# Patient Record
Sex: Male | Born: 1975
Health system: Southern US, Community
[De-identification: ages and names within clinical notes are randomized; demographics above are authoritative.]

## PROBLEM LIST (undated history)

## (undated) ENCOUNTER — Emergency Department (HOSPITAL_COMMUNITY): Admission: EM | Payer: MEDICAID | Source: Home / Self Care

## (undated) DIAGNOSIS — F411 Generalized anxiety disorder: Secondary | ICD-10-CM

## (undated) DIAGNOSIS — E119 Type 2 diabetes mellitus without complications: Secondary | ICD-10-CM

## (undated) DIAGNOSIS — F419 Anxiety disorder, unspecified: Secondary | ICD-10-CM

## (undated) DIAGNOSIS — Z720 Tobacco use: Secondary | ICD-10-CM

## (undated) DIAGNOSIS — I5042 Chronic combined systolic (congestive) and diastolic (congestive) heart failure: Secondary | ICD-10-CM

## (undated) DIAGNOSIS — K219 Gastro-esophageal reflux disease without esophagitis: Secondary | ICD-10-CM

## (undated) DIAGNOSIS — E7800 Pure hypercholesterolemia, unspecified: Secondary | ICD-10-CM

## (undated) DIAGNOSIS — I219 Acute myocardial infarction, unspecified: Secondary | ICD-10-CM

## (undated) DIAGNOSIS — I471 Supraventricular tachycardia, unspecified: Secondary | ICD-10-CM

## (undated) DIAGNOSIS — F329 Major depressive disorder, single episode, unspecified: Secondary | ICD-10-CM

## (undated) DIAGNOSIS — I10 Essential (primary) hypertension: Secondary | ICD-10-CM

## (undated) DIAGNOSIS — Z9861 Coronary angioplasty status: Secondary | ICD-10-CM

## (undated) DIAGNOSIS — R7303 Prediabetes: Secondary | ICD-10-CM

## (undated) DIAGNOSIS — I2510 Atherosclerotic heart disease of native coronary artery without angina pectoris: Secondary | ICD-10-CM

## (undated) DIAGNOSIS — F32A Depression, unspecified: Secondary | ICD-10-CM

## (undated) DIAGNOSIS — I255 Ischemic cardiomyopathy: Secondary | ICD-10-CM

## (undated) HISTORY — DX: Generalized anxiety disorder: F41.1

## (undated) HISTORY — DX: Essential (primary) hypertension: I10

## (undated) HISTORY — PX: CORONARY ANGIOPLASTY WITH STENT PLACEMENT: SHX49

## (undated) HISTORY — DX: Coronary angioplasty status: Z98.61

## (undated) HISTORY — DX: Major depressive disorder, single episode, unspecified: F32.9

## (undated) HISTORY — DX: Atherosclerotic heart disease of native coronary artery without angina pectoris: I25.10

## (undated) HISTORY — DX: Depression, unspecified: F32.A

## (undated) HISTORY — DX: Supraventricular tachycardia: I47.1

## (undated) HISTORY — DX: Prediabetes: R73.03

## (undated) HISTORY — DX: Tobacco use: Z72.0

## (undated) HISTORY — DX: Gastro-esophageal reflux disease without esophagitis: K21.9

## (undated) HISTORY — DX: Morbid (severe) obesity due to excess calories: E66.01

## (undated) HISTORY — DX: Supraventricular tachycardia, unspecified: I47.10

---

## 2003-11-25 ENCOUNTER — Emergency Department (HOSPITAL_COMMUNITY): Admission: EM | Admit: 2003-11-25 | Discharge: 2003-11-25 | Payer: Self-pay | Admitting: Family Medicine

## 2004-05-04 ENCOUNTER — Emergency Department (HOSPITAL_COMMUNITY): Admission: EM | Admit: 2004-05-04 | Discharge: 2004-05-04 | Payer: Self-pay | Admitting: Emergency Medicine

## 2006-04-26 ENCOUNTER — Emergency Department (HOSPITAL_COMMUNITY): Admission: EM | Admit: 2006-04-26 | Discharge: 2006-04-26 | Payer: Self-pay | Admitting: Family Medicine

## 2007-09-16 ENCOUNTER — Emergency Department (HOSPITAL_COMMUNITY): Admission: EM | Admit: 2007-09-16 | Discharge: 2007-09-16 | Payer: Self-pay | Admitting: Emergency Medicine

## 2009-03-23 ENCOUNTER — Ambulatory Visit: Payer: Self-pay | Admitting: Internal Medicine

## 2009-03-23 ENCOUNTER — Inpatient Hospital Stay (HOSPITAL_COMMUNITY): Admission: EM | Admit: 2009-03-23 | Discharge: 2009-03-27 | Payer: Self-pay | Admitting: Emergency Medicine

## 2009-03-23 ENCOUNTER — Other Ambulatory Visit: Payer: Self-pay

## 2009-03-26 ENCOUNTER — Encounter: Payer: Self-pay | Admitting: Internal Medicine

## 2009-07-28 ENCOUNTER — Emergency Department (HOSPITAL_COMMUNITY): Admission: EM | Admit: 2009-07-28 | Discharge: 2009-07-28 | Payer: Self-pay | Admitting: Emergency Medicine

## 2010-03-15 ENCOUNTER — Ambulatory Visit: Payer: Self-pay | Admitting: Cardiology

## 2010-06-21 ENCOUNTER — Ambulatory Visit: Payer: Self-pay | Admitting: Cardiology

## 2010-07-20 ENCOUNTER — Ambulatory Visit: Payer: Self-pay | Admitting: Cardiology

## 2010-10-31 ENCOUNTER — Emergency Department (HOSPITAL_COMMUNITY): Payer: Self-pay

## 2010-10-31 ENCOUNTER — Inpatient Hospital Stay (HOSPITAL_COMMUNITY)
Admission: EM | Admit: 2010-10-31 | Discharge: 2010-11-03 | DRG: 247 | Disposition: A | Discharge status: Home / Self Care | Payer: Self-pay | Attending: Cardiology | Admitting: Cardiology

## 2010-10-31 DIAGNOSIS — R7309 Other abnormal glucose: Secondary | ICD-10-CM | POA: Diagnosis present

## 2010-10-31 DIAGNOSIS — R079 Chest pain, unspecified: Secondary | ICD-10-CM

## 2010-10-31 DIAGNOSIS — I214 Non-ST elevation (NSTEMI) myocardial infarction: Principal | ICD-10-CM | POA: Diagnosis present

## 2010-10-31 DIAGNOSIS — I252 Old myocardial infarction: Secondary | ICD-10-CM

## 2010-10-31 DIAGNOSIS — F17200 Nicotine dependence, unspecified, uncomplicated: Secondary | ICD-10-CM | POA: Diagnosis present

## 2010-10-31 DIAGNOSIS — I2510 Atherosclerotic heart disease of native coronary artery without angina pectoris: Secondary | ICD-10-CM | POA: Diagnosis present

## 2010-10-31 DIAGNOSIS — E785 Hyperlipidemia, unspecified: Secondary | ICD-10-CM | POA: Diagnosis present

## 2010-10-31 LAB — POCT I-STAT, CHEM 8
BUN: 7 mg/dL (ref 6–23)
Calcium, Ion: 1.12 mmol/L (ref 1.12–1.32)
Chloride: 100 mEq/L (ref 96–112)
Creatinine, Ser: 1.2 mg/dL (ref 0.4–1.5)
Glucose, Bld: 136 mg/dL — ABNORMAL HIGH (ref 70–99)
HCT: 47 % (ref 39.0–52.0)
Hemoglobin: 16 g/dL (ref 13.0–17.0)
Potassium: 3.7 mEq/L (ref 3.5–5.1)
Sodium: 140 mEq/L (ref 135–145)
TCO2: 30 mmol/L (ref 0–100)

## 2010-10-31 LAB — DIFFERENTIAL
Basophils Absolute: 0.1 10*3/uL (ref 0.0–0.1)
Basophils Relative: 0 % (ref 0–1)
Eosinophils Absolute: 0.2 10*3/uL (ref 0.0–0.7)
Eosinophils Relative: 1 % (ref 0–5)
Lymphocytes Relative: 23 % (ref 12–46)
Lymphs Abs: 3.2 10*3/uL (ref 0.7–4.0)
Monocytes Absolute: 1 10*3/uL (ref 0.1–1.0)
Monocytes Relative: 7 % (ref 3–12)
Neutro Abs: 9.6 10*3/uL — ABNORMAL HIGH (ref 1.7–7.7)
Neutrophils Relative %: 68 % (ref 43–77)

## 2010-10-31 LAB — CBC
HCT: 42.8 % (ref 39.0–52.0)
Hemoglobin: 15.1 g/dL (ref 13.0–17.0)
MCH: 29.9 pg (ref 26.0–34.0)
MCHC: 35.3 g/dL (ref 30.0–36.0)
MCV: 84.8 fL (ref 78.0–100.0)
Platelets: 241 10*3/uL (ref 150–400)
RBC: 5.05 MIL/uL (ref 4.22–5.81)
RDW: 12.8 % (ref 11.5–15.5)
WBC: 14.1 10*3/uL — ABNORMAL HIGH (ref 4.0–10.5)

## 2010-10-31 LAB — LIPID PANEL
HDL: 41 mg/dL (ref >39)
Total CHOL/HDL Ratio: 4.1 RATIO
Triglycerides: 129 mg/dL (ref <150)
VLDL: 26 mg/dL (ref 0–40)

## 2010-10-31 LAB — MRSA PCR SCREENING

## 2010-10-31 LAB — HEPARIN LEVEL (UNFRACTIONATED)

## 2010-10-31 LAB — CK TOTAL AND CKMB (NOT AT ARMC)
CK, MB: 47.7 ng/mL (ref 0.3–4.0)
CK, MB: 46.7 ng/mL (ref 0.3–4.0)
Relative Index: 7.4 — ABNORMAL HIGH (ref 0.0–2.5)
Relative Index: 7 — ABNORMAL HIGH (ref 0.0–2.5)
Total CK: 642 U/L — ABNORMAL HIGH (ref 7–232)
Total CK: 666 U/L — ABNORMAL HIGH (ref 7–232)

## 2010-10-31 LAB — POCT CARDIAC MARKERS
CKMB, poc: 37.2 ng/mL (ref 1.0–8.0)
Myoglobin, poc: 137 ng/mL (ref 12–200)
Troponin i, poc: 3.64 ng/mL (ref 0.00–0.09)

## 2010-10-31 LAB — TROPONIN I

## 2010-10-31 LAB — HEMOGLOBIN A1C: Mean Plasma Glucose: 126 mg/dL — ABNORMAL HIGH (ref <117)

## 2010-10-31 NOTE — H&P (Signed)
NAMELARRI, YEHLE NO.:  [id number]  MEDICAL RECORD NO.:  [id number]           PATIENT TYPE:  E  LOCATION:  [facility]                         FACILITY:  [facility]  PHYSICIAN:  Wendi Snipes, MD DATE OF BIRTH:  [birth year]  DATE OF ADMISSION:  10/31/2010 DATE OF DISCHARGE:                             HISTORY & PHYSICAL   CARDIOLOGIST:  Colleen Can. Deborah Chalk, M.D.  PRIMARY CARE DOCTOR:  Lady Gary, NP.  CHIEF COMPLAINT:  Chest pain.  HISTORY OF PRESENT ILLNESS:  This is a 35 year old African American male with a history of coronary disease status post PCI to his mid LAD who presents with a few days of stuttering chest pain.  He states that he noticed a heartburn sensation in his mid chest that began on Thursday and lasted briefly.  The pain returned a few times yesterday and he took some Tums without relief.  He was worried about his symptoms and they feel distinctly different than his previous myocardial infarction in August, though he felt he needed to be checked out and reports to the emergency department.  He otherwise denies any recent exertional angina, increased lower extremity edema, palpitations, syncope or presyncope.  PAST MEDICAL HISTORY: 1. Coronary disease status post myocardial infarction requiring bare     metal stenting to the mid LAD in August 2010.  He additionally had     nonobstructive moderate coronary disease in the circumflex artery     and right coronary artery. 2. Hyperlipidemia.  ALLERGIES:  No known drug allergies.  MEDICATIONS ON ADMISSION:  Zocor.  SOCIAL HISTORY:  He lives in [place].  He works at [employer].  He continues to smoke one pack per day.  FAMILY HISTORY:  His father had myocardial infarction in his 72s.  REVIEW OF SYSTEMS:  All 14 systems were reviewed and were negative except as mentioned detail in the HPI.  PHYSICAL EXAM:  VITAL SIGNS:  Blood pressure is 113/65, respiratory rate 16, pulse is 85, satting 99%  on room air. GENERAL:  He is a 35 year old African American male appearing stated age in no acute distress. HEENT:  Moist mucous membranes.  Pupils equal, round, reactive to light and accommodation.  Anicteric sclerae. NECK:  No jugular venous distention.  No thyromegaly. CARDIOVASCULAR:  Regular rate and rhythm.  No murmurs, rubs or gallops. LUNGS:  Clear to auscultation bilaterally. ABDOMEN:  Nontender, nondistended.  Positive bowel sounds.  No masses. EXTREMITIES:  No clubbing, cyanosis or edema. NEUROLOGIC:  Alert and oriented x3.  Cranial nerves II-XII grossly intact.  No focal neurologic deficit. PSYCH:  Mood and affect are appropriate. SKIN:  Warm, dry, intact.  No rashes.  RADIOLOGY:  Chest x-ray is currently pending.  EKG showed normal sinus rhythm with a rate of 91 beats per minute with no ST-T wave abnormalities suggestive of ongoing ischemia.  LABORATORY DATA:  White count is 14, hematocrit 42.8, potassium is 3.7, creatinine is 1.2.  Troponin on point care marker is 3.64.  ASSESSMENT/PLAN:  This is a 35 year old [place] American male with history of coronary disease status post bare metal stenting here with an non-ST elevation myocardial infarction.  1. Non-ST elevation myocardial infarction.  We will start dual     antiplatelet therapy now and heparin.  We will obtain serial EKGs     if the chest pain returns.  We will consider an early invasive     strategy for further management of his coronary disease. 2. Hyperlipidemia.  We will continue Zocor and check a fasting lipid     profile.     Wendi Snipes, MD     BHH/MEDQ  D:  10/31/2010  T:  10/31/2010  Job:  [id number]  Electronically Signed by Jim Desanctis MD on 10/31/2010 03:26:01 PM

## 2010-11-01 DIAGNOSIS — I2510 Atherosclerotic heart disease of native coronary artery without angina pectoris: Secondary | ICD-10-CM

## 2010-11-01 DIAGNOSIS — I369 Nonrheumatic tricuspid valve disorder, unspecified: Secondary | ICD-10-CM

## 2010-11-01 LAB — BASIC METABOLIC PANEL
BUN: 10 mg/dL (ref 6–23)
CO2: 26 mEq/L (ref 19–32)
Calcium: 8.5 mg/dL (ref 8.4–10.5)
Chloride: 101 mEq/L (ref 96–112)
Creatinine, Ser: 1.01 mg/dL (ref 0.4–1.5)
GFR calc non Af Amer: >60 mL/min (ref >60)
Glucose, Bld: 126 mg/dL — ABNORMAL HIGH (ref 70–99)
Potassium: 3.8 mEq/L (ref 3.5–5.1)
Sodium: 135 mEq/L (ref 135–145)

## 2010-11-01 LAB — CARDIAC PANEL(CRET KIN+CKTOT+MB+TROPI)
CK, MB: 13.4 ng/mL (ref 0.3–4.0)
CK, MB: 24.3 ng/mL (ref 0.3–4.0)
Relative Index: 3.6 — ABNORMAL HIGH (ref 0.0–2.5)
Relative Index: 4.6 — ABNORMAL HIGH (ref 0.0–2.5)
Total CK: 376 U/L — ABNORMAL HIGH (ref 7–232)
Total CK: 530 U/L — ABNORMAL HIGH (ref 7–232)

## 2010-11-01 LAB — PROTIME-INR
INR: 0.97 (ref 0.00–1.49)
Prothrombin Time: 13.1 seconds (ref 11.6–15.2)

## 2010-11-01 LAB — CBC
HCT: 38.6 % — ABNORMAL LOW (ref 39.0–52.0)
Hemoglobin: 13.5 g/dL (ref 13.0–17.0)
MCH: 29.9 pg (ref 26.0–34.0)
MCHC: 35 g/dL (ref 30.0–36.0)
MCV: 85.6 fL (ref 78.0–100.0)
Platelets: 205 10*3/uL (ref 150–400)
RBC: 4.51 MIL/uL (ref 4.22–5.81)
RDW: 12.8 % (ref 11.5–15.5)
WBC: 11.9 10*3/uL — ABNORMAL HIGH (ref 4.0–10.5)

## 2010-11-01 LAB — POCT ACTIVATED CLOTTING TIME: Activated Clotting Time: 788 seconds

## 2010-11-01 LAB — LIPID PANEL
HDL: 35 mg/dL — ABNORMAL LOW (ref >39)
Total CHOL/HDL Ratio: 5.3 RATIO
Triglycerides: 917 mg/dL — ABNORMAL HIGH (ref <150)
VLDL: UNDETERMINED mg/dL (ref 0–40)

## 2010-11-01 LAB — HEPARIN LEVEL (UNFRACTIONATED)

## 2010-11-02 DIAGNOSIS — I214 Non-ST elevation (NSTEMI) myocardial infarction: Secondary | ICD-10-CM

## 2010-11-02 LAB — BASIC METABOLIC PANEL
BUN: 10 mg/dL (ref 6–23)
CO2: 24 mEq/L (ref 19–32)
Calcium: 8.6 mg/dL (ref 8.4–10.5)
Chloride: 104 mEq/L (ref 96–112)
Creatinine, Ser: 1.16 mg/dL (ref 0.4–1.5)
GFR calc Af Amer: >60 mL/min (ref >60)
GFR calc non Af Amer: >60 mL/min (ref >60)
Glucose, Bld: 254 mg/dL — ABNORMAL HIGH (ref 70–99)
Potassium: 4.5 mEq/L (ref 3.5–5.1)
Sodium: 135 mEq/L (ref 135–145)

## 2010-11-02 LAB — CBC
HCT: 37.7 % — ABNORMAL LOW (ref 39.0–52.0)
Hemoglobin: 13.1 g/dL (ref 13.0–17.0)
MCH: 29.7 pg (ref 26.0–34.0)
MCHC: 34.7 g/dL (ref 30.0–36.0)
MCV: 85.5 fL (ref 78.0–100.0)
Platelets: 195 10*3/uL (ref 150–400)
RBC: 4.41 MIL/uL (ref 4.22–5.81)
RDW: 12.6 % (ref 11.5–15.5)
WBC: 17.4 10*3/uL — ABNORMAL HIGH (ref 4.0–10.5)

## 2010-11-02 LAB — GLUCOSE, CAPILLARY
Glucose-Capillary: 130 mg/dL — ABNORMAL HIGH (ref 70–99)
Glucose-Capillary: 178 mg/dL — ABNORMAL HIGH (ref 70–99)

## 2010-11-02 LAB — DIFFERENTIAL
Basophils Absolute: 0 10*3/uL (ref 0.0–0.1)
Basophils Relative: 0 % (ref 0–1)
Eosinophils Absolute: 0 10*3/uL (ref 0.0–0.7)
Eosinophils Relative: 0 % (ref 0–5)
Lymphocytes Relative: 8 % — ABNORMAL LOW (ref 12–46)
Lymphs Abs: 1.5 10*3/uL (ref 0.7–4.0)
Monocytes Absolute: 1.2 10*3/uL — ABNORMAL HIGH (ref 0.1–1.0)
Monocytes Relative: 7 % (ref 3–12)
Neutro Abs: 14.7 10*3/uL — ABNORMAL HIGH (ref 1.7–7.7)
Neutrophils Relative %: 85 % — ABNORMAL HIGH (ref 43–77)

## 2010-11-02 NOTE — Procedures (Signed)
NAMEJACAI, Peter Russo NO.:  [id number]  MEDICAL RECORD NO.:  [id number]           PATIENT TYPE:  I  LOCATION:  2916                         FACILITY:  [facility]  PHYSICIAN:  Verne Carrow, MDDATE OF BIRTH:  07-24-1976  DATE OF PROCEDURE:  11/01/2010 DATE OF DISCHARGE:                           CARDIAC CATHETERIZATION   PRIMARY CARDIOLOGIST:  Colleen Can. Deborah Chalk, MD  PROCEDURES PERFORMED: 1. Left heart catheterization 2. Selective coronary angiography. 3. PTCA with placement of two overlapping drug-eluting stents in the     proximal and mid right coronary artery. 4. Placement of an Angio-Seal femoral artery closure device in the     right femoral artery.  OPERATOR:  Verne Carrow, MD  INDICATIONS:  This is a 35 year old African American male with a history of coronary artery disease with placement of a bare-metal stent in the mid left anterior descending artery in the setting of a non-ST-elevation myocardial infarction in August 2010.  The patient was admitted to the hospital with positive cardiac enzymes on October 31, 2010.  Diagnostic catheterization was arranged for today.  The catheterization was delayed as the patient was chest pain free.  There were no EKG changes to suggest an occluded vessel.  DETAILS OF PROCEDURE:  The patient was brought to the main cardiac catheterization laboratory after signing informed consent for the procedure.  I chose to use the right femoral artery for access as I knew from the prior case that this patient had separate ostia for the left anterior descending artery and circumflex artery.  There was difficulty with catheter engagement during the prior catheterization by Dr. Deborah Chalk in 2010.  The right groin was prepped and draped in sterile fashion. Lidocaine 1% was used for local anesthesia.  A 5-French sheath was inserted into the right femoral artery without difficulty.  A JL-4 catheter was used to engage both  the circumflex and the left anterior descending arteries in selective fashion.  These arteries had separate ostia.  There was no left main artery.  A JR-4 catheter was used to selectively engage and inject the native right coronary artery.  A pigtail catheter was used to measure left ventricular pressures.  No left ventricular angiogram was performed.  At this point of the case we elected to proceed intervention of the totally occluded mid right coronary artery.  The patient was given a bolus of Angiomax and a drip was started.  The patient had been loaded with Plavix yesterday.  We upsized the sheath to a 6-French sheath over a wire.  A 6-French JR-4 guiding catheter was used to selectively engage the native right coronary artery.  When the ACT was greater than 200, I passed a Cougar intracoronary wire through the area of total occlusion and into the distal right coronary artery.  A 2.0 x 12-mm balloon was inflated 3 times in the mid vessel with flow reestablished after the balloon inflations.  A 2.25 x 32-mm Promus Element drug-eluting stent was carefully positioned in the mid vessel and was deployed.  A 2.5 x 24-mm Promus drug-eluting stent was placed in the proximal vessel in an overlapping fashion with the previous stent.  A 2.5 x 20-mm noncompliant balloon was carefully positioned in the distal portion of the stented segment and was inflated 3 times back through the proximal vessel.  A 2.75 x 20-mm noncompliant balloon was carefully positioned in the proximal portion of the proximal stent and was inflated.  The stenosis was taken from 100% to 0%.  There were no immediate complications. There was excellent flow into the distal vessel.  The patient had no chest pain during or after the procedure.  The patient was taken to the recovery room in stable condition.  HEMODYNAMIC FINDINGS:  Central aortic pressure 117/82.  Left ventricular pressure 120/16.  Left ventricular end-diastolic  pressure 25.  ANGIOGRAPHIC FINDINGS: 1. There was no left main artery.  The circumflex and the left     anterior descending artery arose from separate ostia. 2. The left anterior descending artery was a moderate-to-large sized     vessel that coursed to the apex and gave off a large septal     perforating branch and a moderate-sized diagonal branch.  The mid     LAD had a patent stented segment with no evidence of restenosis.     The proximal and distal vessel had mild 30% plaque but no flow-     limiting lesions. 3. The circumflex artery gave off a large caliber obtuse marginal     branch.  The AV groove circumflex branch was relatively small     beyond the takeoff of this obtuse marginal branch.  There was a 50-     60% proximal stenosis.  The obtuse marginal branch had a 30%     stenosis.  These lesions appeared unchanged from prior     catheterization 2 years ago and were not flow-limiting. 4. The right coronary artery is a moderate-sized vessel with diffuse     70% stenosis throughout the proximal portion of the vessel followed     by 100% occlusion in the midportion of the vessel. 5. No left ventricular angiogram was performed.  IMPRESSION: 1. Non-ST-elevation myocardial infarction secondary to occluded right     coronary artery. 2. Patent stent in the mid left anterior descending artery. 3. Moderate disease in the circumflex artery. 4. Successful percutaneous transluminal coronary angioplasty with     placement of 2 overlapping drug-eluting stents in the right     coronary artery in the proximal and midportion.  RECOMMENDATIONS:  We will continue medical treatment with aspirin and Plavix for at least 1 year.  We will also continue his beta-blocker and statin.  We will discontinue his intravenous heparin drip.  The patient will be watched closely in the step-down ICU tonight.     Verne Carrow, MD     CM/MEDQ  D:  11/01/2010  T:  11/02/2010  Job:   [id number]  Electronically Signed by Verne Carrow MD on 11/02/2010 10:09:28 AM

## 2010-11-03 LAB — GLUCOSE, CAPILLARY: Glucose-Capillary: 174 mg/dL — ABNORMAL HIGH (ref 70–99)

## 2010-11-05 ENCOUNTER — Telehealth: Payer: Self-pay | Admitting: Cardiovascular Disease

## 2010-11-05 DIAGNOSIS — I10 Essential (primary) hypertension: Secondary | ICD-10-CM

## 2010-11-05 MED ORDER — LISINOPRIL 5 MG PO TABS: 5.0000 mg | ORAL_TABLET | Freq: Every day | ORAL | Status: DC

## 2010-11-05 NOTE — Telephone Encounter (Signed)
Pt requesting a refill of lisinopril at [facility]

## 2010-11-16 LAB — URINALYSIS, ROUTINE W REFLEX MICROSCOPIC
Bilirubin Urine: NEGATIVE
Glucose, UA: NEGATIVE mg/dL
Ketones, ur: NEGATIVE mg/dL
Leukocytes, UA: NEGATIVE
Nitrite: NEGATIVE
Protein, ur: 100 mg/dL — AB
Specific Gravity, Urine: 1.014 (ref 1.005–1.030)
Urobilinogen, UA: 1 mg/dL (ref 0.0–1.0)
pH: 6 (ref 5.0–8.0)

## 2010-11-16 LAB — URINE MICROSCOPIC-ADD ON

## 2010-11-16 LAB — GC/CHLAMYDIA PROBE AMP, GENITAL
Chlamydia, DNA Probe: NEGATIVE
GC Probe Amp, Genital: NEGATIVE

## 2010-11-17 ENCOUNTER — Encounter: Payer: Self-pay | Admitting: Physician Assistant

## 2010-11-18 ENCOUNTER — Telehealth: Payer: Self-pay | Admitting: Cardiovascular Disease

## 2010-11-18 NOTE — Telephone Encounter (Signed)
This use to be Dr Ronnald Nian pt but during most recent hospitalization the pt switched to Dr Clifton James.  The pt was scheduled to see Tereso Newcomer PA on 11/17/10 but no showed for appointment.  I will forward this message to [facility] RN to contact the pt.

## 2010-11-21 LAB — CARDIAC PANEL(CRET KIN+CKTOT+MB+TROPI)
CK, MB: 1.9 ng/mL (ref 0.3–4.0)
CK, MB: 16 ng/mL — ABNORMAL HIGH (ref 0.3–4.0)
CK, MB: 18.5 ng/mL — ABNORMAL HIGH (ref 0.3–4.0)
CK, MB: 21.8 ng/mL — ABNORMAL HIGH (ref 0.3–4.0)
CK, MB: 6.5 ng/mL — ABNORMAL HIGH (ref 0.3–4.0)
Relative Index: 0.5 (ref 0.0–2.5)
Relative Index: 1.8 (ref 0.0–2.5)
Relative Index: 3.7 — ABNORMAL HIGH (ref 0.0–2.5)
Relative Index: 4.2 — ABNORMAL HIGH (ref 0.0–2.5)
Relative Index: 4.7 — ABNORMAL HIGH (ref 0.0–2.5)
Total CK: 356 U/L — ABNORMAL HIGH (ref 7–232)
Total CK: 361 U/L — ABNORMAL HIGH (ref 7–232)
Total CK: 438 U/L — ABNORMAL HIGH (ref 7–232)
Total CK: 439 U/L — ABNORMAL HIGH (ref 7–232)
Total CK: 466 U/L — ABNORMAL HIGH (ref 7–232)
Troponin I: 0.1 ng/mL — ABNORMAL HIGH (ref 0.00–0.06)
Troponin I: 0.51 ng/mL (ref 0.00–0.06)
Troponin I: 1.51 ng/mL (ref 0.00–0.06)
Troponin I: 3.7 ng/mL (ref 0.00–0.06)
Troponin I: 4.25 ng/mL (ref 0.00–0.06)

## 2010-11-21 LAB — COMPREHENSIVE METABOLIC PANEL
ALT: 45 U/L (ref 0–53)
AST: 31 U/L (ref 0–37)
Albumin: 3.7 g/dL (ref 3.5–5.2)
Alkaline Phosphatase: 75 U/L (ref 39–117)
BUN: 11 mg/dL (ref 6–23)
CO2: 26 mEq/L (ref 19–32)
Calcium: 8.7 mg/dL (ref 8.4–10.5)
Chloride: 104 mEq/L (ref 96–112)
Creatinine, Ser: 1.13 mg/dL (ref 0.4–1.5)
GFR calc Af Amer: >60 mL/min (ref >60)
GFR calc non Af Amer: >60 mL/min (ref >60)
Glucose, Bld: 120 mg/dL — ABNORMAL HIGH (ref 70–99)
Potassium: 3.6 mEq/L (ref 3.5–5.1)
Sodium: 138 mEq/L (ref 135–145)
Total Bilirubin: 0.7 mg/dL (ref 0.3–1.2)
Total Protein: 6.9 g/dL (ref 6.0–8.3)

## 2010-11-21 LAB — CBC
HCT: 38.6 % — ABNORMAL LOW (ref 39.0–52.0)
HCT: 42.5 % (ref 39.0–52.0)
Hemoglobin: 13.2 g/dL (ref 13.0–17.0)
Hemoglobin: 14.5 g/dL (ref 13.0–17.0)
MCHC: 34.2 g/dL (ref 30.0–36.0)
MCHC: 34.2 g/dL (ref 30.0–36.0)
MCV: 88.7 fL (ref 78.0–100.0)
MCV: 90.2 fL (ref 78.0–100.0)
Platelets: 188 10*3/uL (ref 150–400)
Platelets: 189 10*3/uL (ref 150–400)
RBC: 4.35 MIL/uL (ref 4.22–5.81)
RBC: 4.71 MIL/uL (ref 4.22–5.81)
RDW: 14.1 % (ref 11.5–15.5)
RDW: 14.4 % (ref 11.5–15.5)
WBC: 10.6 10*3/uL — ABNORMAL HIGH (ref 4.0–10.5)
WBC: 11.7 10*3/uL — ABNORMAL HIGH (ref 4.0–10.5)

## 2010-11-21 LAB — POCT I-STAT, CHEM 8
BUN: 13 mg/dL (ref 6–23)
Calcium, Ion: 1.21 mmol/L (ref 1.12–1.32)
Chloride: 103 mEq/L (ref 96–112)
Creatinine, Ser: 1.2 mg/dL (ref 0.4–1.5)
Glucose, Bld: 117 mg/dL — ABNORMAL HIGH (ref 70–99)
HCT: 45 % (ref 39.0–52.0)
Hemoglobin: 15.3 g/dL (ref 13.0–17.0)
Potassium: 3.8 mEq/L (ref 3.5–5.1)
Sodium: 141 mEq/L (ref 135–145)
TCO2: 25 mmol/L (ref 0–100)

## 2010-11-21 LAB — BASIC METABOLIC PANEL
BUN: 10 mg/dL (ref 6–23)
BUN: 5 mg/dL — ABNORMAL LOW (ref 6–23)
CO2: 26 mEq/L (ref 19–32)
CO2: 27 mEq/L (ref 19–32)
Calcium: 8.7 mg/dL (ref 8.4–10.5)
Calcium: 8.9 mg/dL (ref 8.4–10.5)
Chloride: 101 mEq/L (ref 96–112)
Chloride: 104 mEq/L (ref 96–112)
Creatinine, Ser: 0.97 mg/dL (ref 0.4–1.5)
Creatinine, Ser: 0.98 mg/dL (ref 0.4–1.5)
GFR calc Af Amer: >60 mL/min (ref >60)
GFR calc Af Amer: >60 mL/min (ref >60)
GFR calc non Af Amer: >60 mL/min (ref >60)
GFR calc non Af Amer: >60 mL/min (ref >60)
Glucose, Bld: 102 mg/dL — ABNORMAL HIGH (ref 70–99)
Glucose, Bld: 119 mg/dL — ABNORMAL HIGH (ref 70–99)
Potassium: 3.4 mEq/L — ABNORMAL LOW (ref 3.5–5.1)
Potassium: 3.7 mEq/L (ref 3.5–5.1)
Sodium: 135 mEq/L (ref 135–145)
Sodium: 137 mEq/L (ref 135–145)

## 2010-11-21 LAB — PROTIME-INR
INR: 1.1 (ref 0.00–1.49)
Prothrombin Time: 13.7 seconds (ref 11.6–15.2)

## 2010-11-21 LAB — LIPID PANEL
Cholesterol: 240 mg/dL — ABNORMAL HIGH (ref 0–200)
HDL: 32 mg/dL — ABNORMAL LOW (ref >39)
LDL Cholesterol: UNDETERMINED mg/dL (ref 0–99)
Total CHOL/HDL Ratio: 7.5 RATIO
Triglycerides: 412 mg/dL — ABNORMAL HIGH (ref <150)
VLDL: UNDETERMINED mg/dL (ref 0–40)

## 2010-11-21 LAB — APTT: aPTT: 92 seconds — ABNORMAL HIGH (ref 24–37)

## 2010-11-21 LAB — CK TOTAL AND CKMB (NOT AT ARMC)
CK, MB: 1.4 ng/mL (ref 0.3–4.0)
Relative Index: 0.4 (ref 0.0–2.5)
Total CK: 396 U/L — ABNORMAL HIGH (ref 7–232)

## 2010-11-21 LAB — PLATELET COUNT: Platelets: 182 10*3/uL (ref 150–400)

## 2010-11-21 LAB — TROPONIN I: Troponin I: 0.02 ng/mL (ref 0.00–0.06)

## 2010-11-22 ENCOUNTER — Telehealth: Payer: Self-pay | Admitting: Cardiovascular Disease

## 2010-11-22 NOTE — Telephone Encounter (Signed)
Rescheduled appointment to see Dr. Clifton James next Friday 04/20 @ 3pm. He wasn't aware of his appointment with Tereso Newcomer, PA.

## 2010-11-22 NOTE — Telephone Encounter (Signed)
Spoke w/pt he states he was on Simvastatin and was changed to Lipitor in the hospital, he dev. Rash and itching on his back and arms he stopped Lipitor on Fri and has been taking Benadryl he states it is much better, he states he did not know that he had an appt. On Wed.  Advised we will discuss w/Dr Clifton James tom and give pt a call back then

## 2010-11-23 NOTE — Telephone Encounter (Signed)
Patient aware to restart Zocor at previous dose.

## 2010-11-23 NOTE — Telephone Encounter (Signed)
He can be switched back to Zocor. Thanks, chris

## 2010-11-24 NOTE — Discharge Summary (Signed)
Peter Russo, Peter Russo             ACCOUNT NO.:  [id number]  MEDICAL RECORD NO.:  [id number]           PATIENT TYPE:  I  LOCATION:  2009                         FACILITY:  [facility]  PHYSICIAN:  Peter Russo, M.D.DATE OF BIRTH:  [birth year]  DATE OF ADMISSION:  10/31/2010 DATE OF DISCHARGE:  11/03/2010                              DISCHARGE SUMMARY   PRIMARY CARDIOLOGIST:  Previously Peter Can. Peter Chalk, MD; will be Peter Carrow, MD in the future.  PRIMARY CARE PROVIDER:  None.  DISCHARGE DIAGNOSIS:  Non-ST-segment elevation myocardial infarction.  SECONDARY DIAGNOSES: 1. Coronary artery disease status post prior bare-metal stent in the     left anterior descending coronary artery with drug-eluting stent     placement to the right coronary artery this admission. 2. Hyperlipidemia. 3. Hyperglycemia with hemoglobin A1c of 6.0. 4. Ongoing tobacco abuse.  ALLERGIES:  No known drug allergies.  PROCEDURES: 1. Left heart cardiac catheterization performed on November 02, 2010,     revealing patent stent in the LAD.  Circumflex had a 50% proximal     stenosis.  Obtuse marginal had 30% stenosis.  Right coronary artery     had a 70% proximal stenosis and then a total occlusion in the     midsection of the artery.  Left ventriculography was not performed.     The RCA was successfully stented with a 2.25 x 32 mm Promus Element     Plus drug-eluting stent as well as a 2.5 x 24 mm Promus Element     Plus drug-eluting stent. 2. A 2D echocardiogram on November 01, 2010.  EF 55-60%.  Mild LVH.     Mildly dilated right ventricle.  Moderately reduced right     ventricular systolic function.  Mildly dilated right atrium.     Mildly elevated pulmonary artery systolic pressure.  HISTORY OF PRESENT ILLNESS:  [name] with prior history of coronary artery disease status post LAD bare-metal stenting in August 2010.  At that time, the patient otherwise had nonobstructive disease. The  patient was in his usual state of health until several days prior to admission when he began to experience intermittent substernal chest discomfort and heartburn sensation.  He had worsening symptoms on the day prior to admission that were unrelieved with over-the-counter antacids.  Because of progressive symptoms, which he thought were different than prior MI symptoms, he presented to [facility] ED on October 31, 2010.  In the ED, he was noted to have elevated troponin of 3.64. ECG showed no acute ST-T changes.  He was admitted for further evaluation and management of non-ST-elevation MI.  HOSPITAL COURSE:  The patient eventually peaked his CK at 666, MB at 47.7, and troponin I at 7.97.  The patient was maintained on aspirin, Plavix, heparin, beta-blocker, ACE inhibitor, and statin therapy, and had no recurrence of chest pain.  He was counseled the importance of smoking cessation.  The patient was taken to the [facility] on November 01, 2010, and underwent diagnostic catheterization revealing a total occlusion of the mid RCA.  Previously placed LAD stent was patent while he continued to have  nonobstructive disease in the left circumflex.  Attention was turned to the right coronary artery which was successfully stented with placement of 2 Promus Element Plus drug-eluting stents as outlined above.  He tolerated this procedure well and postprocedure 2D echocardiogram showed normal LV function.  The patient has been ambulating and has been seen by cardiac rehab.  He has had no additional chest discomfort.  He has been noted to have mild hyperglycemia with fasting sugars in the 120s-130s.  Hemoglobin A1c was 6.0.  He was seen by the inpatient diabetes coordinator who recommended lifestyle modification at this time.  We recommended obtain a followup with primary care provider.  The patient will be discharged home today in good condition.  DISCHARGE LABS:  Hemoglobin 13.1, hematocrit  37.7, WBC 17.4, platelets 195.  INR 0.97.  Sodium 135, potassium 4.5, chloride 104, CO2 24, BUN 10, creatinine 1.16, glucose 126.  Calcium 8.6.  Hemoglobin A1c 6.0.  CK 376, MB 13.4, troponin I 7.27.  Total cholesterol 169, triglycerides 129, HDL 41, LDL 102.  MRSA screen was negative.  DISPOSITION:  The patient will be discharged home today in good condition.  FOLLOWUP PLANS AND APPOINTMENTS:  The patient will follow up with Peter Newcomer, PA at [facility] on November 17, 2010, at 11:30 a.m.  He will follow up with Dr. Clifton Russo in the future.  DISCHARGE MEDICATIONS: 1. Plavix 75 mg daily. 2. Lipitor 80 mg at bedtime. 3. Lisinopril 5 mg daily. 4. Metoprolol 25 mg 1/2 tablet b.i.d. 5. Nitroglycerin 0.4 mg p.r.n. chest pain. 6. Aspirin 81 mg daily.  OUTSTANDING LAB STUDIES:  Follow up basic metabolic panel when seen on November 17, 2010, given new ACE inhibitor therapy.  Follow up lipids and LFTs in 6-8 weeks given switch from simvastatin to atorvastatin.  DURATION OF DISCHARGE ENCOUNTER:  45 minutes including physician time.     [name], ANP   ______________________________ Peter Russo, M.D.    CB/MEDQ  D:  11/03/2010  T:  11/04/2010  Job:  [id number]  Electronically Signed by [name] ANP on 11/23/2010 04:06:00 PM Electronically Signed by Peter Russo M.D. on 11/24/2010 11:18:16 AM

## 2010-12-02 ENCOUNTER — Encounter: Payer: Self-pay | Admitting: Cardiovascular Disease

## 2010-12-03 ENCOUNTER — Encounter: Payer: Self-pay | Admitting: Cardiovascular Disease

## 2010-12-28 NOTE — H&P (Signed)
NAMEHARJAS, BIGGINS NO.:  [id number]   MEDICAL RECORD NO.:  [id number]          PATIENT TYPE:  INP   LOCATION:  2903                         FACILITY:  [facility]   PHYSICIAN:  Bevelyn Buckles. Bensimhon, MDDATE OF BIRTH:  Dec 25, 1975   DATE OF ADMISSION:  03/23/2009  DATE OF DISCHARGE:                              HISTORY & PHYSICAL   PRIMARY CARE PHYSICIAN:  None.   CARDIOLOGIST:  None.   REASON FOR ADMISSION:  Chest pain with a transient lateral ST-elevation  concerning for acute lateral myocardial infarction.   Mr. Baig is a 35 year old male with a history of tobacco use and  strong family history of coronary artery disease.  He denies any known  personal history of coronary artery disease.  He does not have a history  of hypertension, hyperlipidemia, or diabetes that he knows of.   He woke this morning at 2 a.m. from sleep with chest pain and dyspnea.  According to ward, he arrived at [facility] just after 3 in the  morning.  I do not have the results of the initial EKG.  However, while  in the ER, he had stuttering chest pain and at 4:25, he had some lateral  ST elevation in V4, V5, and V6.  A followup EKG was done at 4:52 a.m.  and this showed resolution of his ST elevation.  Dr. Deborah Chalk was called,  and the code STEMI was activated, and the patient was transferred to the  [facility] ER where I evaluated him.  He is currently pain-free.  EKG  did not show any further ST elevation.  However, given his earlier  symptoms and EKG, he is brought emergently to the cath lab.  He is  currently pain-free.   He denies any exertional chest pain previously.  He denies any drug use.  He does smoke a pack and half a day.  He has not had any heart failure.  No bleeding.  Remainder of review of systems are all systems negative  except for as per HPI and problem list.   PROBLEM LIST:  Strong family history of coronary artery disease.   MEDICATIONS:  None.   ALLERGIES:  None.   SOCIAL HISTORY:  He is unemployed.  He formerly worked as a Scientist, water quality.  He smokes a pack and half of cigarettes a day.  Denies any drug use.   FAMILY HISTORY:  Mother is alive, she is status post bypass surgery.  Father died in his 70s due to massive heart attack.   PHYSICAL EXAMINATION:  GENERAL:  He is lying in bed.  VITAL SIGNS:  His blood pressure is 151/97 and heart rate is 59.  He is  pain-free.  HEENT:  Normal.  NECK:  Supple.  No obvious JVD.  Carotids are 2+ bilaterally, was unable  to hear any bruits.  There is no lymphadenopathy or thyromegaly.  CARDIAC:  PMI is not palpable.  He is regular, question soft systolic  murmur.  LUNGS:  Clear.  ABDOMEN:  Obese, nontender, and nondistended.  No hepatosplenomegaly, no  bruits, and no masses.  Good bowel sounds.  EXTREMITIES:  Warm with no cyanosis, clubbing, or edema.  No rash.  NEUROLOGIC:  Alert and oriented x3.  Cranial nerves II through XII are  intact.  Moves all 4 extremities without difficulty.   First troponin is 0.02.  MB is 1.4.  There are no other labs.  EKG is as  dictated above.  His chest x-ray is normal.   ASSESSMENT:  1. Chest pain with transient lateral ST-elevation concerning for acute      lateral myocardial infarction.  2. Tobacco use.   He will be taken emergently to the cath lab for definitive evaluation.  We will check a urine drug screen.  He will need a smoking cessation  consult.  Further plans based on the results of his catheterization.      Bevelyn Buckles. Bensimhon, MD  Electronically Signed     DRB/MEDQ  D:  03/23/2009  T:  03/23/2009  Job:  [id number]

## 2010-12-28 NOTE — Cardiovascular Report (Signed)
NAMEBRODERICK, FONSECA Peter Russo   MEDICAL RECORD Peter Russo          PATIENT TYPE:  INP   LOCATION:  2903                         FACILITY:  [facility]   PHYSICIAN:  Colleen Can. Deborah Chalk, M.D.DATE OF BIRTH:  05-Feb-1976   DATE OF PROCEDURE:  03/23/2009  DATE OF DISCHARGE:                            CARDIAC CATHETERIZATION   PROCEDURES:  Left heart catheterization with selective coronary  angiography, left ventricular angiography, with a stent in the mid left  anterior descending coronary artery (non-drug-eluting).   TYPE AND SITE OF ENTRY:  Percutaneous right femoral artery.   CATHETERS:  6-French 4 curved Judkins left and right coronary catheters,  6-French pigtail ventriculographic catheter with the guide catheter  being a 6-French 3.5 left coronary catheter, Asahi soft guidewires, 3.0  x 15 mm apex balloon, and the stent being a 3.0 x 23 mm Multi-Link  Vision stent.   MEDICATIONS GIVEN PRIOR TO PROCEDURE:  Heparin.   MEDICATIONS GIVEN DURING THE PROCEDURE:  IV nitroglycerin, Plavix,  Zofran, Versed, fentanyl, Integrilin, heparin to therapeutic level, and  Plavix p.o. (given twice because the first dose was loss with emesis).   CONTRAST MATERIAL:  Omnipaque.   COMMENTS:  The patient had a great deal of chest pain both before and  after the stent placement.  He had ongoing chest pain in spite of a  patent artery that was viewed in multiple images.  He was hypertensive.  The symptoms gradually resolved with sedation and management of his  blood pressure.  He did receive 20 mg of labetalol IV for blood pressure  management.   HEMODYNAMIC DATA:  The aortic pressure was 118/75 initially.  It is  subsequently elevated.  At the time of the left ventricular angiogram,  aortic pressure was 158/103 and LV pressure was 145/7-26.  There was no  aortic valve gradient noted on pullback.   ANGIOGRAPHIC DATA:  1. There was a dual ostium of the left coronary  artery.  There was no      left main coronary artery.  2. Left circumflex:  Left circumflex continued as a large obtuse      marginal with scattered irregularities throughout its course.  At      the point where the obtuse marginal began and the continuation of      the left circumflex proceeded, there appeared to be a 50-60%      narrowing.  There were diffuse irregularities in the left      circumflex continuation branch.  3. Left anterior descending:  Left anterior descending has a 95%      stenosis just proximal to the second diagonal vessel.  It was      difficult to visualize because catheters tend to select the left      circumflex.  4. Right coronary artery:  The right coronary artery was a dominant      vessel.  It had diffuse irregularities with what would appear to be      30-40% narrowings, but no significant focal obstructive disease.      There was adequate distal flow, although distal vessels were  small      to moderate.  There were at least 5 different branches to the      inferior wall.   Left ventricular angiogram was performed in the RAO projection.  The  overall cardiac size was normal.  There was anteroapical hypokinesia.  The global ejection fraction was estimated to be in the 35-40% range.   ANGIOPLASTY PROCEDURE:  The angioplasty was a difficult procedure in  large part because of the difficulty in getting catheters to select the  left anterior descending.  The 95% stenosis in the mid left anterior  descending was approximately 1 cm proximal to a large second diagonal  vessel.  The guidewire selected the second diagonal vessel, and we were  unable to re-stir it into the left anterior descending.  We elected to  proceed on with angioplasty with the guidewire in the second diagonal  vessel.  This turned out to be a satisfactory choice.  We initially  predilated with a 3.0 x 15 mm apex balloon.  We then returned with a 3.0  x 23 mm Multi-Link Vision stent.  It was  inflated to a maximum of 18  atmospheres.  Final angiographic result was satisfactory with no  residual stenosis.  There was excellent flow in the bifurcation of the 2  branches.  The patient continued to have chest pain after the  angioplasty.  It was felt that the vessel was patent, and there were no  occluded side branches from the angioplasty.  We treated him at that  time with narcotics and IV nitroglycerin as well as beta-blockers, and  had resolution of chest pain at the time of leaving the catheterization  lab.  Vessels were patent, and EKG was normal.   OVERALL IMPRESSION:  1. Severe stenosis in the mid left anterior descending coronary with      mild-to-moderate coronary atherosclerosis in the right coronary      artery and left circumflex systems.  2. Anteroapical hypokinesia with moderate left ventricular      dysfunction.  3. Successful stent with a non-drug-eluting stent (in the mid left      anterior descending).      Colleen Can. Deborah Chalk, M.D.  Electronically Signed     SNT/MEDQ  D:  03/23/2009  T:  03/23/2009  Job:  [id number]

## 2010-12-31 NOTE — Discharge Summary (Signed)
Peter, Russo NO.:  [id number]   MEDICAL RECORD NO.:  [id number]          PATIENT TYPE:  INP   LOCATION:  2038                         FACILITY:  [facility]   PHYSICIAN:  Colleen Can. Deborah Chalk, M.D.DATE OF BIRTH:  12/09/1975   DATE OF ADMISSION:  03/23/2009  DATE OF DISCHARGE:  03/27/2009                               DISCHARGE SUMMARY   DISCHARGE DIAGNOSES:  1. Anterior myocardial infarction with subsequent emergent cardiac      catheterization and stenting in the mid left anterior descending.  2. Ongoing tobacco abuse.  3. Strong family history of coronary disease.   HISTORY OF PRESENT ILLNESS:  Mr. Caspers is a 35 year old black male  who has a history of tobacco use and strong family history of coronary  disease.  He denied any known personal history of coronary artery  disease.  He presented with chest discomfort after being awakened from  his sleep at 2:00 a.m. in the morning and subsequent shortness of  breath.  He was taken to [facility] just after 3 o'clock in the morning  and while there, he continued to have stuttering like chest pain.  He  had ST elevation at 4:25 a.m. in leads V4, V5 and V6.  Subsequent code  STEMI was activated and the patient was taken to the [facility]  emergency room and subsequently on to the cardiac catheterization lab.   Please see the history and physical for further patient presentation and  profile.   LABORATORY DATA:  On admission CBC showed hemoglobin 13, hematocrit 38,  white count was 11, platelets were 188.  Peak troponin was 4.2, peak CK-  MB was 18.  Chemistry showed a sodium 135, potassium 3.7, chloride 101,  CO2 of 26, BUN 5, creatinine 0.9 and a glucose of 119.  A chest x-ray on  admission showed no acute cardiopulmonary process.   HOSPITAL COURSE:  The patient was admitted after being transferred from  the [facility] emergency room.  He was taken emergently to the cardiac  catheterization lab per Dr.  Roger Shelter, procedure was tolerated  well without any known complications.  He was noted to have a dual  ostium of the left coronary artery.  There was no left main coronary  artery.  The left circumflex continued as a large obtuse marginal with  scattered irregularities throughout.  At the point where the obtuse  marginal began and the continuation of the left circumflex proceeded,  there appeared to be a 50-60% narrowing.  The LAD had a 95% stenosis  just proximally to the second diagonal.  It was difficult to visualize  because the catheters tended to select the left circumflex artery.  Right coronary artery was a dominant vessel and had diffuse  irregularities with what would appear to be 30-40% in nature.  There was  no significant focal disease.  Ejection fraction was noted to be 35-40%.  The patient subsequently underwent stenting with a non drug-eluting  Multi-Link 3.0 x 15-mm stent to the mid LAD.  The procedure was  successful.  Postprocedure, he was transferred to the coronary care  unit.  He was maintained on IV nitroglycerin which was subsequently able  to be weaned.  He basically did well throughout the remainder of his  hospitalization.  Cardiac rehab was started.  His activity was increased  and tolerated without any difficulty.  A follow up 2-D echocardiogram  showed a normal ejection fraction.  By March 27, 2009, he was doing  well and was felt to be a satisfactory candidate for discharge.   DISCHARGE CONDITION:  Stable.   DISCHARGE DIET:  Low-salt, heart-healthy.   ACTIVITY:  To increase slowly.  He was not to drive as well as not to  engage in sexual activity.  He is to use an ice pack if needed to the  groin.   DISCHARGE MEDICINES:  1. Aspirin 325 a day.  2. Plavix 75 mg a day.  3. Toprol-XL 50 mg a day.  4. Crestor 20 mg a day.  5. Nitroglycerin p.r.n.   Samples will try to be made available from the office.   I plan on seeing him back in the office  in approximately 1-2 weeks,  certainly sooner if any problems arise in the interim.      Sharlee Blew, N.P.      Colleen Can. Deborah Chalk, M.D.  Electronically Signed    LC/MEDQ  D:  04/09/2009  T:  04/09/2009  Job:  [id number]

## 2011-01-17 ENCOUNTER — Other Ambulatory Visit: Payer: Self-pay | Admitting: Nurse Practitioner

## 2011-01-21 ENCOUNTER — Other Ambulatory Visit: Payer: Self-pay

## 2011-01-21 DIAGNOSIS — I10 Essential (primary) hypertension: Secondary | ICD-10-CM

## 2011-01-21 MED ORDER — LISINOPRIL 5 MG PO TABS: 5.0000 mg | ORAL_TABLET | Freq: Every day | ORAL | Status: DC

## 2011-02-28 ENCOUNTER — Ambulatory Visit: Payer: Self-pay | Admitting: Cardiovascular Disease

## 2011-03-14 ENCOUNTER — Telehealth: Payer: Self-pay | Admitting: Cardiovascular Disease

## 2011-03-14 ENCOUNTER — Encounter: Payer: Self-pay | Admitting: Cardiovascular Disease

## 2011-03-14 ENCOUNTER — Ambulatory Visit (INDEPENDENT_AMBULATORY_CARE_PROVIDER_SITE_OTHER): Payer: BC Managed Care – PPO | Admitting: Cardiovascular Disease

## 2011-03-14 DIAGNOSIS — E785 Hyperlipidemia, unspecified: Secondary | ICD-10-CM | POA: Insufficient documentation

## 2011-03-14 DIAGNOSIS — I10 Essential (primary) hypertension: Secondary | ICD-10-CM

## 2011-03-14 DIAGNOSIS — E782 Mixed hyperlipidemia: Secondary | ICD-10-CM

## 2011-03-14 DIAGNOSIS — I2510 Atherosclerotic heart disease of native coronary artery without angina pectoris: Secondary | ICD-10-CM

## 2011-03-14 DIAGNOSIS — F17200 Nicotine dependence, unspecified, uncomplicated: Secondary | ICD-10-CM

## 2011-03-14 DIAGNOSIS — IMO0001 Reserved for inherently not codable concepts without codable children: Secondary | ICD-10-CM

## 2011-03-14 MED ORDER — BUPROPION HCL ER (XL) 150 MG PO TB24: 150.0000 mg | ORAL_TABLET | Freq: Every day | ORAL | Status: DC

## 2011-03-14 NOTE — Assessment & Plan Note (Signed)
Welbutrin and 14 mg patch.  Will call for further help if needed.  Discussed strategies for quitting and will refer to [facility] if initial attempts fail

## 2011-03-14 NOTE — Assessment & Plan Note (Signed)
Stable with no angina and good activity level.  Continue medical Rx  

## 2011-03-14 NOTE — Patient Instructions (Signed)
Your physician recommends that you schedule a follow-up appointment in: 6 MONTHS WITH DR [facility]  Your physician has recommended you make the following change in your medication: START WELLBUTRIN 150 MG 1 EVERY DAY   ALSO START  NICOTINE PATCHES OVER THE COUNTER USE AS DIRECTED

## 2011-03-14 NOTE — Assessment & Plan Note (Signed)
Well controlled.  Continue current medications and low sodium Dash type diet.    

## 2011-03-14 NOTE — Progress Notes (Signed)
35 yo patient of Dr Alice Reichert.  Supposed to F/U with him in April but never called and showed up today in error.  Has history of CAD with stent to LAD in 2010 and recent stent to RCA for SEMI  11/02/10 placed by CM.  EF normal.  Unfortunatly still smoking 1/2 pack /day.  Discussed at length for over 10 minutes importance of quitting.  It is [birth year] birthday today and told him it would be a great quit day.  Will call in Welbutrin and start 14mg  patches.  Patient seems to understand the importance of this.  No SSCP.  Compliant with meds.    ROS: Denies fever, malais, weight loss, blurry vision, decreased visual acuity, cough, sputum, SOB, hemoptysis, pleuritic pain, palpitaitons, heartburn, abdominal pain, melena, lower extremity edema, claudication, or rash.  All other systems reviewed and negative  General: Affect appropriate Healthy:  appears stated age HEENT: normal Neck supple with no adenopathy JVP normal no bruits no thyromegaly Lungs clear with no wheezing and good diaphragmatic motion Heart:  S1/S2 no murmur,rub, gallop or click PMI normal Abdomen: benighn, BS positve, no tenderness, no AAA no bruit.  No HSM or HJR Distal pulses intact with no bruits No edema Neuro non-focal Skin warm and dry No muscular weakness   Current Outpatient Prescriptions  Medication Sig Dispense Refill  . esomeprazole (NEXIUM) 20 MG capsule Take 20 mg by mouth daily before breakfast.        . lisinopril (PRINIVIL,ZESTRIL) 5 MG tablet Take 1 tablet (5 mg total) by mouth daily.  30 tablet  0  . metoprolol (LOPRESSOR) 50 MG tablet TAKE  1/2 TABLET BY MOUTH TWICE DAILY  30 tablet  6  . propranolol (INDERAL) 10 MG tablet Take 10 mg by mouth daily as needed.        . simvastatin (ZOCOR) 80 MG tablet Take 1 tablet by mouth Daily.      . Multiple Vitamin (MULTIVITAMIN) tablet Take 1 tablet by mouth daily.          Allergies  Review of patient's allergies indicates no known allergies.  Electrocardiogram:  NSR  67 Q3,f no acute changes  Assessment and Plan

## 2011-03-14 NOTE — Telephone Encounter (Signed)
Spoke with pt, questions answered Peter Russo  

## 2011-03-14 NOTE — Telephone Encounter (Signed)
pls clarify plavix dosage & direction , refill.

## 2011-03-14 NOTE — Assessment & Plan Note (Signed)
Recently started back on statin.  Discussed low fat diet.  F/U labs in 6 months Lab Results  Component Value Date   LDLCALC  Value: UNABLE TO CALCULATE IF TRIGLYCERIDE OVER 400 mg/dL        Total Cholesterol/HDL:CHD Risk Coronary Heart Disease Risk Table                     Men   Women  1/2 Average Risk   3.4   3.3  Average Risk       5.0   4.4  2 X Average Risk   9.6   7.1  3 X Average Risk  23.4   11.0        Use the calculated Patient Ratio above and the CHD Risk Table to determine the patient's CHD Risk.        ATP III CLASSIFICATION (LDL):  <100     mg/dL   Optimal  161-096  mg/dL   Near or Above                    Optimal  130-159  mg/dL   Borderline  045-409  mg/dL   High  >811     mg/dL   Very High 04/28/7828

## 2011-04-04 ENCOUNTER — Other Ambulatory Visit: Payer: Self-pay

## 2011-04-04 DIAGNOSIS — I10 Essential (primary) hypertension: Secondary | ICD-10-CM

## 2011-04-04 MED ORDER — LISINOPRIL 5 MG PO TABS: 5.0000 mg | ORAL_TABLET | Freq: Every day | ORAL | Status: DC

## 2011-04-22 ENCOUNTER — Other Ambulatory Visit: Payer: Self-pay | Admitting: Cardiovascular Disease

## 2011-04-22 MED ORDER — METOPROLOL TARTRATE 50 MG PO TABS: 25.0000 mg | ORAL_TABLET | Freq: Two times a day (BID) | ORAL | Status: DC

## 2011-04-29 ENCOUNTER — Telehealth: Payer: Self-pay | Admitting: Cardiovascular Disease

## 2011-04-29 NOTE — Telephone Encounter (Signed)
This was done on the 7th

## 2011-04-29 NOTE — Telephone Encounter (Signed)
Metoprolol 50 mg refill, uses [facility] [address] [phone]

## 2011-07-18 ENCOUNTER — Other Ambulatory Visit: Payer: Self-pay | Admitting: Cardiovascular Disease

## 2011-07-19 ENCOUNTER — Other Ambulatory Visit: Payer: Self-pay

## 2011-07-19 MED ORDER — SIMVASTATIN 80 MG PO TABS: 80.0000 mg | ORAL_TABLET | Freq: Every day | ORAL | Status: DC

## 2011-10-30 ENCOUNTER — Other Ambulatory Visit: Payer: Self-pay | Admitting: Cardiovascular Disease

## 2012-02-05 ENCOUNTER — Emergency Department (HOSPITAL_COMMUNITY)
Admission: EM | Admit: 2012-02-05 | Discharge: 2012-02-05 | Disposition: A | Discharge status: Home / Self Care | Payer: BC Managed Care – PPO | Attending: Emergency Medicine | Admitting: Emergency Medicine

## 2012-02-05 ENCOUNTER — Emergency Department (HOSPITAL_COMMUNITY): Payer: BC Managed Care – PPO

## 2012-02-05 ENCOUNTER — Encounter (HOSPITAL_COMMUNITY): Payer: Self-pay | Admitting: Emergency Medicine

## 2012-02-05 DIAGNOSIS — J40 Bronchitis, not specified as acute or chronic: Secondary | ICD-10-CM | POA: Insufficient documentation

## 2012-02-05 DIAGNOSIS — R05 Cough: Secondary | ICD-10-CM | POA: Insufficient documentation

## 2012-02-05 DIAGNOSIS — R059 Cough, unspecified: Secondary | ICD-10-CM | POA: Insufficient documentation

## 2012-02-05 MED ORDER — AZITHROMYCIN 250 MG PO TABS: ORAL_TABLET | ORAL | Status: AC

## 2012-02-05 MED ORDER — HYDROCOD POLST-CHLORPHEN POLST 10-8 MG/5ML PO LQCR: 5.0000 mL | Freq: Two times a day (BID) | ORAL | Status: DC

## 2012-02-05 MED ORDER — ALBUTEROL SULFATE HFA 108 (90 BASE) MCG/ACT IN AERS
2.0000 | INHALATION_SPRAY | RESPIRATORY_TRACT | Status: DC | PRN
  Administered 2012-02-05: 2 via RESPIRATORY_TRACT
  Filled 2012-02-05: qty 6.7

## 2012-02-05 MED ORDER — HYDROCOD POLST-CHLORPHEN POLST 10-8 MG/5ML PO LQCR
5.0000 mL | Freq: Once | ORAL | Status: AC
  Administered 2012-02-05: 5 mL via ORAL
  Filled 2012-02-05: qty 5

## 2012-02-05 MED ORDER — CETIRIZINE-PSEUDOEPHEDRINE ER 5-120 MG PO TB12: 1.0000 | ORAL_TABLET | Freq: Every day | ORAL | Status: AC

## 2012-02-05 MED ORDER — ALBUTEROL SULFATE (5 MG/ML) 0.5% IN NEBU
5.0000 mg | INHALATION_SOLUTION | Freq: Once | RESPIRATORY_TRACT | Status: AC
  Administered 2012-02-05: 5 mg via RESPIRATORY_TRACT
  Filled 2012-02-05: qty 1

## 2012-02-05 MED ORDER — GUAIFENESIN ER 600 MG PO TB12: 600.0000 mg | ORAL_TABLET | Freq: Two times a day (BID) | ORAL | Status: AC

## 2012-02-05 NOTE — ED Provider Notes (Signed)
History     CSN: [id number]  Arrival date & time 02/05/12  0045   First MD Initiated Contact with Patient 02/05/12 0124      Chief Complaint  Patient presents with  . Cough    HPI  History provided by the patient. Patient is 36 year old male with no significant past medical history who presents with complaints of persistent coughing for the past 3 days. Cough has been occasionally productive of white phlegm. Cough seems worse in the evening and night times making it difficult to sleep. Patient has used some over-the-counter medications without improvement. he is a current smoker. Symptoms have been associated with slight headache, rhinorrhea and congestion with left basilar sinus pressure. Symptoms are not associated with any fever, chills, sweats, nausea or vomiting.    Past Medical History  Diagnosis Date  . SVT (supraventricular tachycardia) APRROX 10 YRS AGO    PRESUMED AVNRT, ECHO 12/07 WITH EF 65%, MILD LAE  . GERD (gastroesophageal reflux disease)     Past Surgical History  Procedure Date  . Coronary stent placement     Family History  Problem Relation Age of Onset  . Arrhythmia Mother     MOTHER LIVED TO BE 102  . Coronary artery disease Father 40  . Heart disease Father     History  Substance Use Topics  . Smoking status: Current Everyday Smoker  . Smokeless tobacco: Not on file  . Alcohol Use: Yes      Review of Systems  Constitutional: Positive for fatigue. Negative for fever, chills and appetite change.  Respiratory: Positive for cough. Negative for shortness of breath and wheezing.   Cardiovascular: Negative for chest pain.  Gastrointestinal: Positive for abdominal pain. Negative for nausea, vomiting, diarrhea and constipation.  Skin: Negative for rash.    Allergies  Review of patient's allergies indicates no known allergies.  Home Medications   Current Outpatient Rx  Name Route Sig Dispense Refill  . ASPIRIN EC 81 MG PO TBEC Oral Take 81 mg  by mouth daily.    . BUPROPION HCL ER (XL) 150 MG PO TB24 Oral Take 150 mg by mouth daily.    Marland Kitchen ESOMEPRAZOLE MAGNESIUM 20 MG PO CPDR Oral Take 20 mg by mouth daily before breakfast.      . LISINOPRIL 5 MG PO TABS Oral Take 5 mg by mouth daily.    Marland Kitchen METOPROLOL TARTRATE 50 MG PO TABS Oral Take 25 mg by mouth 2 (two) times daily.    Marland Kitchen ONE-DAILY MULTI VITAMINS PO TABS Oral Take 1 tablet by mouth daily.      Marland Kitchen SIMVASTATIN 80 MG PO TABS Oral Take 80 mg by mouth at bedtime.      BP 142/84  Pulse 108  Temp 99.3 F (37.4 C) (Oral)  Resp 22  SpO2 96%  Physical Exam  Nursing note and vitals reviewed. Constitutional: He is oriented to person, place, and time. He appears well-developed and well-nourished. No distress.  HENT:  Head: Normocephalic.  Right Ear: External ear normal.  Left Ear: External ear normal.  Mouth/Throat: Oropharynx is clear and moist.       Discharge from left nostril. Mild left maxillary sinus tenderness  Cardiovascular: Normal rate and regular rhythm.   No murmur heard. Pulmonary/Chest: Effort normal. No respiratory distress. He has wheezes. He has no rales. He exhibits no tenderness.       Coughing  Abdominal: Soft.       Mild diffuse tenderness  Neurological: He is alert  and oriented to person, place, and time.  Skin: Skin is warm.  Psychiatric: He has a normal mood and affect. His behavior is normal.    ED Course  Procedures    Dg Chest 2 View  02/05/2012  *RADIOLOGY REPORT*  Clinical Data: Cough.  CHEST - 2 VIEW  Comparison: 10/31/2010  Findings: Mild peribronchial thickening. Heart and mediastinal contours are within normal limits.  No focal opacities or effusions.  No acute bony abnormality.  IMPRESSION: Mild bronchitic changes.  Original Report Authenticated By: Cyndie Chime, M.D.     1. Bronchitis       MDM  Patient seen and evaluated. Patient no acute distress.        Angus Seller, [place] 02/05/12 [phone]

## 2012-02-05 NOTE — ED Provider Notes (Signed)
Medical screening examination/treatment/procedure(s) were performed by non-physician practitioner and as supervising physician I was immediately available for consultation/collaboration.  Dionysios Massman, MD 02/05/12 0729 

## 2012-02-05 NOTE — ED Notes (Signed)
PT. REPORTS PRODUCTIVE COUGH FOR 3 DAYS , DENIES SOB OR CHEST PAIN .

## 2012-02-05 NOTE — Discharge Instructions (Signed)
You were seen and evaluated today for your symptoms of cough. Your chest x-ray showed signs for bronchitis infection. You have been given an inhaler to use for your cough and bronchitis infection. Use this as instructed by taking 2 puffs every 4 hours for cough or shortness of breath. You've also been of other medications to help with your symptoms. Please take these as prescribed for the full length of time.   Bronchitis Bronchitis is the body's way of reacting to injury and/or infection (inflammation) of the bronchi. Bronchi are the air tubes that extend from the windpipe into the lungs. If the inflammation becomes severe, it may cause shortness of breath. CAUSES  Inflammation may be caused by:  A virus.   Germs (bacteria).   Dust.   Allergens.   Pollutants and many other irritants.  The cells lining the bronchial tree are covered with tiny hairs (cilia). These constantly beat upward, away from the lungs, toward the mouth. This keeps the lungs free of pollutants. When these cells become too irritated and are unable to do their job, mucus begins to develop. This causes the characteristic cough of bronchitis. The cough clears the lungs when the cilia are unable to do their job. Without either of these protective mechanisms, the mucus would settle in the lungs. Then you would develop pneumonia. Smoking is a common cause of bronchitis and can contribute to pneumonia. Stopping this habit is the single most important thing you can do to help yourself. TREATMENT   Your caregiver may prescribe an antibiotic if the cough is caused by bacteria. Also, medicines that open up your airways make it easier to breathe. Your caregiver may also recommend or prescribe an expectorant. It will loosen the mucus to be coughed up. Only take over-the-counter or prescription medicines for pain, discomfort, or fever as directed by your caregiver.   Removing whatever causes the problem (smoking, for example) is critical  to preventing the problem from getting worse.   Cough suppressants may be prescribed for relief of cough symptoms.   Inhaled medicines may be prescribed to help with symptoms now and to help prevent problems from returning.   For those with recurrent (chronic) bronchitis, there may be a need for steroid medicines.  SEEK IMMEDIATE MEDICAL CARE IF:   During treatment, you develop more pus-like mucus (purulent sputum).   You have a fever.   Your baby is older than 3 months with a rectal temperature of 102 F (38.9 C) or higher.   Your baby is 63 months old or younger with a rectal temperature of 100.4 F (38 C) or higher.   You become progressively more ill.   You have increased difficulty breathing, wheezing, or shortness of breath.  It is necessary to seek immediate medical care if you are elderly or sick from any other disease. MAKE SURE YOU:   Understand these instructions.   Will watch your condition.   Will get help right away if you are not doing well or get worse.  Document Released: 08/01/2005 Document Revised: 07/21/2011 Document Reviewed: 06/10/2008 [facility] Patient Information 2012 [place], [place].    RESOURCE GUIDE  Chronic Pain Problems: Contact [facility]  [phone] Patients need to be referred by their primary care doctor.  Insufficient Money for Medicine: Contact United Way:  call "211" or [facility] [phone].  No Primary Care Doctor: - Call Health Connect  [phone] - can help you locate a primary care doctor that  accepts your insurance,  provides certain services, etc. - Physician Referral [phone]  Agencies that provide inexpensive medical care: - [facility]  [phone] - [facility]  [phone] - [facility]  [phone] [facility]  [phone] - [facility]  [phone] [facility]  [phone]  Medicaid-accepting [facility]  Providers: - [facility]- [address]  [phone], Mon-Fri 9am-7pm, Sat 9am-1pm - [facility]- [address] [place], [address] [place]  [phone] - [facility]- [address], [address] [place]  [phone] [facility]- [address]  [phone] - Renaye Rakers- [address] [place], [address], [phone]  Only accepts [place] patients after they have their name  applied to their card  Self Pay (no insurance) in [place]: - Sickle Cell Patients: Dr Willey Blade, [facility]  [address] [place], [phone] - [facility]- [address] [place]  [phone]       [facility] [place]- [address]       -     [facility]- see information above (Speak to [employer] if you do not have insurance)       -  [facility]- [address] [place], [phone]       -  [facility]- [address],  [phone]       -  [facility]- [address], [phone]       -  Dr Julio Sicks-  [address], [place], [phone]       -  [facility]- [address], [phone]       -  [facility]- [address], [phone], also [address], [phone]       -    [facility]- [address] [place], [phone], 1st & 3rd Saturday   every month, 10am-1pm  1) Find a Doctor and Pay Out of Pocket Although you won't have to find out who is covered by your insurance plan, it is a good idea to ask around and get recommendations. You will then need to call the office and see if the doctor you have chosen will accept you as a new patient and what types of options they offer for patients who are self-pay. Some doctors offer discounts or will set up payment plans for their patients who do not have insurance, but you will need to ask so you aren't surprised when you get to your  appointment.  2) Contact Your Local Health Department Not all health departments have doctors that can see patients for sick visits, but many do, so it is worth a call to see if yours does. If you don't know where your local health department is, you can check in your phone book. The CDC also has a tool to help you locate your state's health department, and many state websites also have listings of all of their local health departments.  3) Find a Walk-in Clinic If your illness is not likely to be very severe or complicated, you may want to try a walk in clinic. These are popping up all over the country in pharmacies, drugstores, and shopping centers. They're usually staffed by nurse practitioners or physician assistants that have been trained to treat  common illnesses and complaints. They're usually fairly quick and inexpensive. However, if you have serious medical issues or chronic medical problems, these are probably not your best option  STD Testing - [facility] Department of [facility] [place], STD Clinic, [address] [place], phone [phone] or [phone].  Monday - Friday, call for an appointment. [facility] Department of [employer], STD Clinic, [address], [place], phone [phone] or [phone].  Monday - Friday, call for an appointment.  Abuse/Neglect: [facility] Child Abuse Hotline 9290215958 [facility] Child Abuse Hotline 754-757-2320 (After Hours)  Emergency Shelter:  [facility] [phone]  Maternity Homes: - [facility] at [facility] [phone] - [facility] ([phone]  MRSA Hotline #:   4790331285  [facility] Resources  [facility]  United Way [facility] Dept. [address]                 [address]         [address] [place] [address]  Blondell Reveal Phone:  [phone]                                  Phone:  [phone]                   Phone:  [phone]  [facility] Mental Health, [phone] - [phone]       -     [facility] in [place], [address]                                  [phone], [facility] Child Abuse Hotline (314)407-1650 or (604)806-3585 (After Hours)   Behavioral Health Services  Substance Abuse Resources: - [facility]  [phone] - [facility] [phone] - [facility] [place] [phone] Letroy Vazguez [phone] - Residential & Outpatient Substance Abuse Program  [phone]  Psychological Services: [facility]  [phone] Services  [phone] - [facility], [phone] [place]. [address] [place], ACCESS LINE: 2723179946 or ([phone], [employer]za  Dental Assistance  If unable to pay or uninsured, contact:  [facility] or [facility]. to become qualified for the adult dental clinic.  Patients with Medicaid: [facility] [phone], [phone] [address] [phone]  If unable to pay, or uninsured, contact [facility] [phone]) [phone]) or [facility] Department [phone] in [place], [phone] in [facility]) to become qualified for the adult dental clinic  Other Low-Cost Community Dental Services: - [facility]- [address] [place], [place], [place], [address], [phone], [phone], 2nd and 4th Thursday of the month at 6:30am.  10 clients each day by appointment, can sometimes  see walk-in patients if someone does not show for an appointment. [facility]- [address] [place], [place], [address], [phone] - [facility]- [address] [place], [place], [address],  [phone] - [facility]- [phone] [facility] Health Department- [phone] [facility] Department- [phone]

## 2012-03-07 ENCOUNTER — Other Ambulatory Visit: Payer: Self-pay | Admitting: *Deleted

## 2012-03-07 MED ORDER — METOPROLOL TARTRATE 50 MG PO TABS: 25.0000 mg | ORAL_TABLET | Freq: Two times a day (BID) | ORAL | Status: DC

## 2012-05-11 ENCOUNTER — Other Ambulatory Visit: Payer: Self-pay | Admitting: Cardiovascular Disease

## 2013-02-25 ENCOUNTER — Ambulatory Visit: Payer: BC Managed Care – PPO | Attending: Family Medicine

## 2013-03-01 ENCOUNTER — Ambulatory Visit: Payer: BC Managed Care – PPO | Attending: Family Medicine | Admitting: Family Medicine

## 2013-03-01 VITALS — BP 157/94 | HR 76 | Temp 98.5°F | Ht 67.0 in | Wt 260.2 lb

## 2013-03-01 DIAGNOSIS — I2510 Atherosclerotic heart disease of native coronary artery without angina pectoris: Secondary | ICD-10-CM

## 2013-03-01 DIAGNOSIS — K219 Gastro-esophageal reflux disease without esophagitis: Secondary | ICD-10-CM

## 2013-03-01 DIAGNOSIS — F17200 Nicotine dependence, unspecified, uncomplicated: Secondary | ICD-10-CM

## 2013-03-01 DIAGNOSIS — I10 Essential (primary) hypertension: Secondary | ICD-10-CM

## 2013-03-01 DIAGNOSIS — E782 Mixed hyperlipidemia: Secondary | ICD-10-CM

## 2013-03-01 LAB — COMPLETE METABOLIC PANEL WITH GFR
ALT: 28 U/L (ref 0–53)
AST: 20 U/L (ref 0–37)
Albumin: 4.6 g/dL (ref 3.5–5.2)
Alkaline Phosphatase: 89 U/L (ref 39–117)
BUN: 12 mg/dL (ref 6–23)
CO2: 30 mEq/L (ref 19–32)
Calcium: 10 mg/dL (ref 8.4–10.5)
Chloride: 102 mEq/L (ref 96–112)
Creat: 1.06 mg/dL (ref 0.50–1.35)
GFR, Est African American: >89 mL/min
GFR, Est Non African American: 89 mL/min
Glucose, Bld: 80 mg/dL (ref 70–99)
Potassium: 4.3 mEq/L (ref 3.5–5.3)
Sodium: 137 mEq/L (ref 135–145)
Total Bilirubin: 0.7 mg/dL (ref 0.3–1.2)
Total Protein: 7.4 g/dL (ref 6.0–8.3)

## 2013-03-01 LAB — LIPID PANEL
Cholesterol: 250 mg/dL — ABNORMAL HIGH (ref 0–200)
HDL: 32 mg/dL — ABNORMAL LOW (ref >39)
LDL Cholesterol: 142 mg/dL — ABNORMAL HIGH (ref 0–99)
Total CHOL/HDL Ratio: 7.8 Ratio
Triglycerides: 380 mg/dL — ABNORMAL HIGH (ref <150)
VLDL: 76 mg/dL — ABNORMAL HIGH (ref 0–40)

## 2013-03-01 MED ORDER — LISINOPRIL 5 MG PO TABS: 5.0000 mg | ORAL_TABLET | Freq: Every day | ORAL | Status: DC

## 2013-03-01 MED ORDER — SIMVASTATIN 80 MG PO TABS: 80.0000 mg | ORAL_TABLET | Freq: Every day | ORAL | Status: DC

## 2013-03-01 MED ORDER — METOPROLOL SUCCINATE ER 25 MG PO TB24: 25.0000 mg | ORAL_TABLET | Freq: Every day | ORAL | Status: DC

## 2013-03-01 NOTE — Patient Instructions (Addendum)
Cholesterol Cholesterol is a white, waxy, fat-like protein needed by your body in small amounts. The liver makes all the cholesterol you need. It is carried from the liver by the blood through the blood vessels. Deposits (plaque) may build up on blood vessel walls. This makes the arteries narrower and stiffer. Plaque increases the risk for heart attack and stroke. You cannot feel your cholesterol level even if it is very high. The only way to know is by a blood test to check your lipid (fats) levels. Once you know your cholesterol levels, you should keep a record of the test results. Work with your caregiver to to keep your levels in the desired range. WHAT THE RESULTS MEAN:  Total cholesterol is a rough measure of all the cholesterol in your blood.  LDL is the so-called bad cholesterol. This is the type that deposits cholesterol in the walls of the arteries. You want this level to be low.  HDL is the good cholesterol because it cleans the arteries and carries the LDL away. You want this level to be high.  Triglycerides are fat that the body can either burn for energy or store. High levels are closely linked to heart disease. DESIRED LEVELS:  Total cholesterol below 200.  LDL below 100 for people at risk, below 70 for very high risk.  HDL above 50 is good, above 60 is best.  Triglycerides below 150. HOW TO LOWER YOUR CHOLESTEROL:  Diet.  Choose fish or white meat chicken and [place], roasted or baked. Limit fatty cuts of red meat, fried foods, and processed meats, such as sausage and lunch meat.  Eat lots of fresh fruits and vegetables. Choose whole grains, beans, pasta, potatoes and cereals.  Use only small amounts of olive, corn or canola oils. Avoid butter, mayonnaise, shortening or palm kernel oils. Avoid foods with trans-fats.  Use skim/nonfat milk and low-fat/nonfat yogurt and cheeses. Avoid whole milk, cream, ice cream, egg yolks and cheeses. Healthy desserts include angel food  cake, ginger snaps, animal crackers, hard candy, popsicles, and low-fat/nonfat frozen yogurt. Avoid pastries, cakes, pies and cookies.  Exercise.  A regular program helps decrease LDL and raises HDL.  Helps with weight control.  Do things that increase your activity level like gardening, walking, or taking the stairs.  Medication.  May be prescribed by your caregiver to help lowering cholesterol and the risk for heart disease.  You may need medicine even if your levels are normal if you have several risk factors. HOME CARE INSTRUCTIONS   Follow your diet and exercise programs as suggested by your caregiver.  Take medications as directed.  Have blood work done when your caregiver feels it is necessary. MAKE SURE YOU:   Understand these instructions.  Will watch your condition.  Will get help right away if you are not doing well or get worse. Document Released: 04/26/2001 Document Revised: 10/24/2011 Document Reviewed: 10/17/2007 [facility] Patient Information 2014 [place], [place]. Hypertension As your heart beats, it forces blood through your arteries. This force is your blood pressure. If the pressure is too high, it is called hypertension (HTN) or high blood pressure. HTN is dangerous because you may have it and not know it. High blood pressure may mean that your heart has to work harder to pump blood. Your arteries may be narrow or stiff. The extra work puts you at risk for heart disease, stroke, and other problems.  Blood pressure consists of two numbers, a higher number over a lower, 110/72, for example.  It is stated as "110 over 72." The ideal is below 120 for the top number (systolic) and under 80 for the bottom (diastolic). Write down your blood pressure today. You should pay close attention to your blood pressure if you have certain conditions such as:  Heart failure.  Prior heart attack.  Diabetes  Chronic kidney disease.  Prior stroke.  Multiple risk factors for  heart disease. To see if you have HTN, your blood pressure should be measured while you are seated with your arm held at the level of the heart. It should be measured at least twice. A one-time elevated blood pressure reading (especially in the Emergency Department) does not mean that you need treatment. There may be conditions in which the blood pressure is different between your right and left arms. It is important to see your caregiver soon for a recheck. Most people have essential hypertension which means that there is not a specific cause. This type of high blood pressure may be lowered by changing lifestyle factors such as:  Stress.  Smoking.  Lack of exercise.  Excessive weight.  Drug/tobacco/alcohol use.  Eating less salt. Most people do not have symptoms from high blood pressure until it has caused damage to the body. Effective treatment can often prevent, delay or reduce that damage. TREATMENT  When a cause has been identified, treatment for high blood pressure is directed at the cause. There are a large number of medications to treat HTN. These fall into several categories, and your caregiver will help you select the medicines that are best for you. Medications may have side effects. You should review side effects with your caregiver. If your blood pressure stays high after you have made lifestyle changes or started on medicines,   Your medication(s) may need to be changed.  Other problems may need to be addressed.  Be certain you understand your prescriptions, and know how and when to take your medicine.  Be sure to follow up with your caregiver within the time frame advised (usually within two weeks) to have your blood pressure rechecked and to review your medications.  If you are taking more than one medicine to lower your blood pressure, make sure you know how and at what times they should be taken. Taking two medicines at the same time can result in blood pressure that is  too low. SEEK IMMEDIATE MEDICAL CARE IF:  You develop a severe headache, blurred or changing vision, or confusion.  You have unusual weakness or numbness, or a faint feeling.  You have severe chest or abdominal pain, vomiting, or breathing problems. MAKE SURE YOU:   Understand these instructions.  Will watch your condition.  Will get help right away if you are not doing well or get worse. Document Released: 08/01/2005 Document Revised: 10/24/2011 Document Reviewed: 03/21/2008 [employer] Patient Information 2014 [employer].  

## 2013-03-01 NOTE — Progress Notes (Signed)
Patient ID: Peter Russo, male   DOB: 17-Jul-1976, 37 y.o.   MRN: [id number]  CC:  Chief Complaint  Patient presents with  . Establish Care  . Medication Refill   HPI: Pt is presenting to establish care.  He has history of Hypertension and SVT and gERD.   Pt says the needs refills of his medications.  He has been out for over 1 year.  He says he has not been able to find a physician to fill his medications.  He has CAD and has a stent in place.  He has not seen cardiology in a very long time.    No Known Allergies Past Medical History  Diagnosis Date  . SVT (supraventricular tachycardia) APRROX 10 YRS AGO    PRESUMED AVNRT, ECHO 12/07 WITH EF 65%, MILD LAE  . GERD (gastroesophageal reflux disease)    Current Outpatient Prescriptions on File Prior to Visit  Medication Sig Dispense Refill  . aspirin EC 81 MG tablet Take 81 mg by mouth daily.      Marland Kitchen esomeprazole (NEXIUM) 20 MG capsule Take 20 mg by mouth daily before breakfast.        . Multiple Vitamin (MULTIVITAMIN) tablet Take 1 tablet by mouth daily.         No current facility-administered medications on file prior to visit.   Family History  Problem Relation Age of Onset  . Arrhythmia Mother     MOTHER LIVED TO BE 102  . Coronary artery disease Father 7  . Heart disease Father    History   Social History  . Marital Status: Single    Spouse Name: N/A    Number of Children: N/A  . Years of Education: N/A   Occupational History  . ACCOUNTANT    Social History Main Topics  . Smoking status: Current Every Day Smoker  . Smokeless tobacco: Not on file  . Alcohol Use: Yes  . Drug Use: No  . Sexually Active: Not on file   Other Topics Concern  . Not on file   Social History Narrative   REGULAR EXERCISE    Review of Systems  Constitutional: Negative for fever, chills, diaphoresis, activity change, appetite change and fatigue.  HENT: Negative for ear pain, nosebleeds, congestion, facial swelling, rhinorrhea,  neck pain, neck stiffness and ear discharge.   Eyes: Negative for pain, discharge, redness, itching and visual disturbance.  Respiratory: Negative for cough, choking, chest tightness, shortness of breath, wheezing and stridor.   Cardiovascular: Negative for chest pain, palpitations and leg swelling.  Gastrointestinal: Negative for abdominal distention.  Genitourinary: Negative for dysuria, urgency, frequency, hematuria, flank pain, decreased urine volume, difficulty urinating and dyspareunia.  Musculoskeletal: Negative for back pain, joint swelling, arthralgias and gait problem.  Neurological: Negative for dizziness, tremors, seizures, syncope, facial asymmetry, speech difficulty, weakness, light-headedness, numbness and headaches.  Hematological: Negative for adenopathy. Does not bruise/bleed easily.  Psychiatric/Behavioral: Negative for hallucinations, behavioral problems, confusion, dysphoric mood, decreased concentration and agitation.    Objective:   Filed Vitals:   03/01/13 1241  BP: 157/94  Pulse: 76  Temp: 98.5 F (36.9 C)    Physical Exam  Constitutional: Appears well-developed and well-nourished. No distress.  HENT: Normocephalic. External right and left ear normal. Oropharynx is clear and moist.  Eyes: Conjunctivae and EOM are normal. PERRLA, no scleral icterus.  Neck: Normal ROM. Neck supple. No JVD. No tracheal deviation. No thyromegaly.  CVS: RRR, S1/S2 +, no murmurs, no gallops, no carotid bruit.  Pulmonary: Effort and breath sounds normal, no stridor, rhonchi, wheezes, rales.  Abdominal: Soft. BS +,  no distension, tenderness, rebound or guarding.  Musculoskeletal: Normal range of motion. No edema and no tenderness.  Lymphadenopathy: No lymphadenopathy noted, cervical, inguinal. Neuro: Alert. Normal reflexes, muscle tone coordination. No cranial nerve deficit. Skin: Skin is warm and dry. No rash noted. Not diaphoretic. No erythema. No pallor.  Psychiatric: Normal  mood and affect. Behavior, judgment, thought content normal.   Lab Results  Component Value Date   WBC 17.4* 11/02/2010   HGB 13.1 11/02/2010   HCT 37.7* 11/02/2010   MCV 85.5 11/02/2010   PLT 195 11/02/2010   Lab Results  Component Value Date   CREATININE 1.16 11/02/2010   BUN 10 11/02/2010   NA 135 11/02/2010   K 4.5 11/02/2010   CL 104 11/02/2010   CO2 24 11/02/2010    Lab Results  Component Value Date   HGBA1C  Value: 6.0 (NOTE)                                                                       According to the ADA Clinical Practice Recommendations for 2011, when HbA1c is used as a screening test:   >=6.5%   Diagnostic of Diabetes Mellitus           (if abnormal result  is confirmed)  5.7-6.4%   Increased risk of developing Diabetes Mellitus  References:Diagnosis and Classification of Diabetes Mellitus,Diabetes Care,2011,34(Suppl 1):S62-S69 and Standards of Medical Care in         Diabetes - 2011,Diabetes Care,2011,34  (Suppl 1):S11-S61.* 10/31/2010   Lipid Panel     Component Value Date/Time   CHOL  Value: 185        ATP III CLASSIFICATION:  <200     mg/dL   Desirable  098-119  mg/dL   Borderline High  >=147    mg/dL   High        04/12/5620 0425   TRIG 917* 11/01/2010 0425   HDL 35* 11/01/2010 0425   CHOLHDL 5.3 11/01/2010 0425   VLDL UNABLE TO CALCULATE IF TRIGLYCERIDE OVER 400 mg/dL 10/20/6576 4696   LDLCALC  Value: UNABLE TO CALCULATE IF TRIGLYCERIDE OVER 400 mg/dL        Total Cholesterol/HDL:CHD Risk Coronary Heart Disease Risk Table                     Men   Women  1/2 Average Risk   3.4   3.3  Average Risk       5.0   4.4  2 X Average Risk   9.6   7.1  3 X Average Risk  23.4   11.0        Use the calculated Patient Ratio above and the CHD Risk Table to determine the patient's CHD Risk.        ATP III CLASSIFICATION (LDL):  <100     mg/dL   Optimal  295-284  mg/dL   Near or Above                    Optimal  130-159  mg/dL   Borderline  132-440  mg/dL   High  >102  mg/dL   Very High  1/61/0960 0425       Assessment and plan:   Patient Active Problem List   Diagnosis Date Noted  . GERD (gastroesophageal reflux disease) 03/01/2013  . CAD (coronary artery disease) 03/14/2011  . Smoking 03/14/2011  . Hypertension 03/14/2011  . Mixed hyperlipidemia 03/14/2011   Smoking - Plan: COMPLETE METABOLIC PANEL WITH GFR, Lipid panel, HgB A1c  Mixed hyperlipidemia - Plan: COMPLETE METABOLIC PANEL WITH GFR, Lipid panel, HgB A1c  Hypertension - Plan: COMPLETE METABOLIC PANEL WITH GFR, Lipid panel, HgB A1c  CAD (coronary artery disease) - Plan: COMPLETE METABOLIC PANEL WITH GFR, Lipid panel, HgB A1c  GERD (gastroesophageal reflux disease)  The patient was counseled on the dangers of tobacco use, and was advised to quit and referred to a tobacco cessation program.  Reviewed strategies to maximize success, including removing cigarettes and smoking materials from environment.  Refilled meds today and will follow lab results Refer to cardiology  The patient was given clear instructions to go to ER or return to medical center if symptoms don't improve, worsen or new problems develop.  The patient verbalized understanding.  The patient was told to call to get any lab results if not heard anything in the next week.    RTC in 3 months  Rodney Langton, MD, CDE, FAAFP [facility] [place], [place]

## 2013-03-02 NOTE — Progress Notes (Signed)
Quick Note:  Please inform patient that labs came back OK except that cholesterol levels have elevated. Please make sure patient is really taking his simvastatin daily every evening. Recommend rechecking in 3 months.   Rodney Langton, MD, CDE, FAAFP [facility] [place], [place]   ______

## 2013-03-04 ENCOUNTER — Telehealth: Payer: Self-pay | Admitting: *Deleted

## 2013-03-04 NOTE — Telephone Encounter (Signed)
03/04/13 Patient made aware that lab came back OK except  cholesterol levels have elevated.  Patient stated he is taking  His cholesterol medication. Recommend rechecking in 3 months. P.Frimet Durfee RN BSN [facility]

## 2013-04-29 ENCOUNTER — Encounter: Payer: Self-pay | Admitting: Cardiovascular Disease

## 2013-04-29 ENCOUNTER — Ambulatory Visit (INDEPENDENT_AMBULATORY_CARE_PROVIDER_SITE_OTHER): Payer: No Typology Code available for payment source | Admitting: Cardiovascular Disease

## 2013-04-29 VITALS — BP 139/77 | HR 56 | Ht 67.0 in | Wt 261.0 lb

## 2013-04-29 DIAGNOSIS — E782 Mixed hyperlipidemia: Secondary | ICD-10-CM

## 2013-04-29 DIAGNOSIS — I10 Essential (primary) hypertension: Secondary | ICD-10-CM

## 2013-04-29 DIAGNOSIS — I2510 Atherosclerotic heart disease of native coronary artery without angina pectoris: Secondary | ICD-10-CM

## 2013-04-29 NOTE — Progress Notes (Signed)
Patient ID: Peter Russo, male   DOB: 1975/09/10, 37 y.o.   MRN: [id number]  37 yo patient of Dr Elberta Fortis  Cath in  2010 with BMS to mid LAD  F/U cath 3/12 with patent LAD stent and new DES stents to RCA Has not been seen here for two years  Referred by [facility]  Has orange card now.  Not working  Just started back on meds for BP and cholesterol One of them is making him itchy.  No angina No exertional chest pain.  No dyspnea or palpitations.  Not always compliant with meds due to finances.  Taking ASA     ROS: Denies fever, malais, weight loss, blurry vision, decreased visual acuity, cough, sputum, SOB, hemoptysis, pleuritic pain, palpitaitons, heartburn, abdominal pain, melena, lower extremity edema, claudication, or rash.  All other systems reviewed and negative   General: Affect appropriate Obese black male HEENT: normal Neck supple with no adenopathy JVP normal no bruits no thyromegaly Lungs clear with no wheezing and good diaphragmatic motion Heart:  S1/S2 no murmur,rub, gallop or click PMI normal Abdomen: benighn, BS positve, no tenderness, no AAA no bruit.  No HSM or HJR Distal pulses intact with no bruits No edema Neuro non-focal Skin warm and dry No muscular weakness  Medications Current Outpatient Prescriptions  Medication Sig Dispense Refill  . aspirin EC 81 MG tablet Take 81 mg by mouth daily.      Marland Kitchen esomeprazole (NEXIUM) 20 MG capsule Take 20 mg by mouth daily before breakfast.        . metoprolol succinate (TOPROL-XL) 25 MG 24 hr tablet Take 1 tablet (25 mg total) by mouth daily.  30 tablet  3  . Multiple Vitamin (MULTIVITAMIN) tablet Take 1 tablet by mouth daily.        Marland Kitchen lisinopril (PRINIVIL,ZESTRIL) 5 MG tablet Take 1 tablet (5 mg total) by mouth daily.  30 tablet  4  . simvastatin (ZOCOR) 80 MG tablet Take 1 tablet (80 mg total) by mouth at bedtime.  30 tablet  4   No current facility-administered medications for this visit.    Allergies Review of  patient's allergies indicates no known allergies.  Family History: Family History  Problem Relation Age of Onset  . Arrhythmia Mother     MOTHER LIVED TO BE 102  . Coronary artery disease Father 66  . Heart disease Father     Social History: History   Social History  . Marital Status: Single    Spouse Name: N/A    Number of Children: N/A  . Years of Education: N/A   Occupational History  . ACCOUNTANT    Social History Main Topics  . Smoking status: Current Every Day Smoker  . Smokeless tobacco: Not on file  . Alcohol Use: Yes  . Drug Use: No  . Sexual Activity: Not on file   Other Topics Concern  . Not on file   Social History Narrative   REGULAR EXERCISE    Electrocardiogram:  SR rate 56 normal ECG  Assessment and Plan

## 2013-04-29 NOTE — Assessment & Plan Note (Signed)
Stable with no angina and good activity level.  Continue medical Rx  

## 2013-04-29 NOTE — Assessment & Plan Note (Signed)
Cholesterol is at goal.  Continue current dose of statin and diet Rx.  No myalgias or side effects.  F/U  LFT's in 6 months. Lab Results  Component Value Date   LDLCALC 142* 03/01/2013

## 2013-04-29 NOTE — Patient Instructions (Signed)
Your physician wants you to follow-up in: YEAR WITH DR NISHAN  You will receive a reminder letter in the mail two months in advance. If you don't receive a letter, please call our office to schedule the follow-up appointment.  Your physician recommends that you continue on your current medications as directed. Please refer to the Current Medication list given to you today. 

## 2013-04-29 NOTE — Assessment & Plan Note (Signed)
Well controlled.  Continue current medications and low sodium Dash type diet.    

## 2013-05-06 ENCOUNTER — Encounter: Payer: Self-pay | Admitting: Internal Medicine

## 2013-05-06 ENCOUNTER — Ambulatory Visit: Payer: No Typology Code available for payment source | Attending: Internal Medicine | Admitting: Internal Medicine

## 2013-05-06 VITALS — BP 125/73 | HR 65 | Temp 98.7°F | Resp 16 | Wt 263.0 lb

## 2013-05-06 DIAGNOSIS — L299 Pruritus, unspecified: Secondary | ICD-10-CM | POA: Insufficient documentation

## 2013-05-06 DIAGNOSIS — I10 Essential (primary) hypertension: Secondary | ICD-10-CM | POA: Insufficient documentation

## 2013-05-06 DIAGNOSIS — Z79899 Other long term (current) drug therapy: Secondary | ICD-10-CM | POA: Insufficient documentation

## 2013-05-06 DIAGNOSIS — K219 Gastro-esophageal reflux disease without esophagitis: Secondary | ICD-10-CM | POA: Insufficient documentation

## 2013-05-06 DIAGNOSIS — Z7982 Long term (current) use of aspirin: Secondary | ICD-10-CM | POA: Insufficient documentation

## 2013-05-06 DIAGNOSIS — I498 Other specified cardiac arrhythmias: Secondary | ICD-10-CM | POA: Insufficient documentation

## 2013-05-06 MED ORDER — HYDROXYZINE HCL 10 MG PO TABS: 10.0000 mg | ORAL_TABLET | Freq: Three times a day (TID) | ORAL | Status: DC | PRN

## 2013-05-06 MED ORDER — DIPHENHYDRAMINE HCL 2 % EX CREA: TOPICAL_CREAM | Freq: Three times a day (TID) | CUTANEOUS | Status: DC | PRN

## 2013-05-06 NOTE — Patient Instructions (Signed)
Lisinopril tablets What is this medicine? LISINOPRIL (lyse IN oh pril) is an ACE inhibitor. This medicine is used to treat high blood pressure and heart failure. It is also used to protect the heart immediately after a heart attack. This medicine may be used for other purposes; ask your health care provider or pharmacist if you have questions. What should I tell my health care provider before I take this medicine? They need to know if you have any of these conditions: -diabetes -heart or blood vessel disease -immune system disease like lupus or scleroderma -kidney disease -low blood pressure -previous swelling of the tongue, face, or lips with difficulty breathing, difficulty swallowing, hoarseness, or tightening of the throat -an unusual or allergic reaction to lisinopril, other ACE inhibitors, insect venom, foods, dyes, or preservatives -pregnant or trying to get pregnant -breast-feeding How should I use this medicine? Take this medicine by mouth with a glass of water. Follow the directions on your prescription label. You may take this medicine with or without food. Take your medicine at regular intervals. Do not stop taking this medicine except on the advice of your doctor or health care professional. Talk to your pediatrician regarding the use of this medicine in children. Special care may be needed. While this drug may be prescribed for children as young as 6 years of age for selected conditions, precautions do apply. Overdosage: If you think you have taken too much of this medicine contact a poison control center or emergency room at once. NOTE: This medicine is only for you. Do not share this medicine with others. What if I miss a dose? If you miss a dose, take it as soon as you can. If it is almost time for your next dose, take only that dose. Do not take double or extra doses. What may interact with this medicine? -diuretics -lithium -NSAIDs, medicines for pain and inflammation,  like ibuprofen or naproxen -over-the-counter herbal supplements like hawthorn -potassium salts or potassium supplements -salt substitutes This list may not describe all possible interactions. Give your health care provider a list of all the medicines, herbs, non-prescription drugs, or dietary supplements you use. Also tell them if you smoke, drink alcohol, or use illegal drugs. Some items may interact with your medicine. What should I watch for while using this medicine? Visit your doctor or health care professional for regular check ups. Check your blood pressure as directed. Ask your doctor what your blood pressure should be, and when you should contact him or her. Call your doctor or health care professional if you notice an irregular or fast heart beat. Women should inform their doctor if they wish to become pregnant or think they might be pregnant. There is a potential for serious side effects to an unborn child. Talk to your health care professional or pharmacist for more information. Check with your doctor or health care professional if you get an attack of severe diarrhea, nausea and vomiting, or if you sweat a lot. The loss of too much body fluid can make it dangerous for you to take this medicine. You may get drowsy or dizzy. Do not drive, use machinery, or do anything that needs mental alertness until you know how this drug affects you. Do not stand or sit up quickly, especially if you are an older patient. This reduces the risk of dizzy or fainting spells. Alcohol can make you more drowsy and dizzy. Avoid alcoholic drinks. Avoid salt substitutes unless you are told otherwise by your doctor or   health care professional. Do not treat yourself for coughs, colds, or pain while you are taking this medicine without asking your doctor or health care professional for advice. Some ingredients may increase your blood pressure. What side effects may I notice from receiving this medicine? Side effects  that you should report to your doctor or health care professional as soon as possible: -abdominal pain with or without nausea or vomiting -allergic reactions like skin rash or hives, swelling of the hands, feet, face, lips, throat, or tongue -dark urine -difficulty breathing -dizzy, lightheaded or fainting spell -fever or sore throat -irregular heart beat, chest pain -pain or difficulty passing urine -redness, blistering, peeling or loosening of the skin, including inside the mouth -unusually weak -yellowing of the eyes or skin Side effects that usually do not require medical attention (report to your doctor or health care professional if they continue or are bothersome): -change in taste -cough -decreased sexual function or desire -headache -sun sensitivity -tiredness This list may not describe all possible side effects. Call your doctor for medical advice about side effects. You may report side effects to FDA at 1-800-FDA-1088. Where should I keep my medicine? Keep out of the reach of children. Store at room temperature between 15 and 30 degrees C (59 and 86 degrees F). Protect from moisture. Keep container tightly closed. Throw away any unused medicine after the expiration date. NOTE: This sheet is a summary. It may not cover all possible information. If you have questions about this medicine, talk to your doctor, pharmacist, or health care provider.  2012, Elsevier/Gold Standard. (02/04/2008 5:36:32 PM) 

## 2013-05-06 NOTE — Progress Notes (Signed)
Pt is here for a rash onset 2 weeks Rash in random places all over body Denies: new hygiene products or new foods/meds Thought it was his Chol med or BP med that he has been on since 7/14 He is alert w/no signs of acuate distress.

## 2013-05-06 NOTE — Progress Notes (Signed)
Patient ID: NEO YEPIZ, male   DOB: Mar 01, 1976, 37 y.o.   MRN: [id number]   CC: Itching  HPI: Patient is 37 year old male who presents to clinic with main concern of persistent itching over the past few weeks. He is not sure what triggered it, he explains he was at a friend's house that has dogs and he is afraid he might have caught something. He denies any recent changes to her medical regimen. He is not sure if medicines could be contributing. He denies any open sores, no known allergic reactions, no specific papules or macules on the skin noted. He denies fevers and chills, no specific systemic concerns, no joint swelling, no abdominal or urinary concerns, no chest pain or shortness of breath, no changes in appetite, no weight loss.  No Known Allergies Past Medical History  Diagnosis Date  . SVT (supraventricular tachycardia) APRROX 10 YRS AGO    PRESUMED AVNRT, ECHO 12/07 WITH EF 65%, MILD LAE  . GERD (gastroesophageal reflux disease)   . Hypertension    Current Outpatient Prescriptions on File Prior to Visit  Medication Sig Dispense Refill  . aspirin EC 81 MG tablet Take 81 mg by mouth daily.      . metoprolol succinate (TOPROL-XL) 25 MG 24 hr tablet Take 1 tablet (25 mg total) by mouth daily.  30 tablet  3  . Multiple Vitamin (MULTIVITAMIN) tablet Take 1 tablet by mouth daily.        . simvastatin (ZOCOR) 80 MG tablet Take 1 tablet (80 mg total) by mouth at bedtime.  30 tablet  4  . esomeprazole (NEXIUM) 20 MG capsule Take 20 mg by mouth daily before breakfast.        . lisinopril (PRINIVIL,ZESTRIL) 5 MG tablet Take 1 tablet (5 mg total) by mouth daily.  30 tablet  4   No current facility-administered medications on file prior to visit.   Family History  Problem Relation Age of Onset  . Arrhythmia Mother     MOTHER LIVED TO BE 102  . Coronary artery disease Father 39  . Heart disease Father    History   Social History  . Marital Status: Single    Spouse Name: N/A     Number of Children: N/A  . Years of Education: N/A   Occupational History  . ACCOUNTANT    Social History Main Topics  . Smoking status: Current Every Day Smoker -- 1.50 packs/day    Types: Cigarettes  . Smokeless tobacco: Not on file  . Alcohol Use: No  . Drug Use: No  . Sexual Activity: Not on file   Other Topics Concern  . Not on file   Social History Narrative   REGULAR EXERCISE    Review of Systems  Constitutional: Negative for fever, chills, diaphoresis, activity change, appetite change and fatigue.  HENT: Negative for ear pain, nosebleeds, congestion, facial swelling, rhinorrhea, neck pain, neck stiffness and ear discharge.   Eyes: Negative for pain, discharge, redness, itching and visual disturbance.  Respiratory: Negative for cough, choking, chest tightness, shortness of breath, wheezing and stridor.   Cardiovascular: Negative for chest pain, palpitations and leg swelling.  Gastrointestinal: Negative for abdominal distention.  Genitourinary: Negative for dysuria, urgency, frequency, hematuria, flank pain, decreased urine volume, difficulty urinating and dyspareunia.  Musculoskeletal: Negative for back pain, joint swelling, arthralgias and gait problem.  Neurological: Negative for dizziness, tremors, seizures, syncope, facial asymmetry, speech difficulty, weakness, light-headedness, numbness and headaches.  Hematological: Negative for adenopathy. Does not  bruise/bleed easily.  Psychiatric/Behavioral: Negative for hallucinations, behavioral problems, confusion, dysphoric mood, decreased concentration and agitation.    Objective:   Filed Vitals:   05/06/13 1151  BP: 125/73  Pulse: 65  Temp: 98.7 F (37.1 C)  Resp: 16    Physical Exam  Constitutional: Appears well-developed and well-nourished. No distress.  HENT: Normocephalic. External right and left ear normal. Oropharynx is clear and moist.  Eyes: Conjunctivae and EOM are normal. PERRLA, no scleral icterus.   Neck: Normal ROM. Neck supple. No JVD. No tracheal deviation. No thyromegaly.  CVS: RRR, S1/S2 +, no murmurs, no gallops, no carotid bruit.  Pulmonary: Effort and breath sounds normal, no stridor, rhonchi, wheezes, rales.  Abdominal: Soft. BS +,  no distension, tenderness, rebound or guarding.  Musculoskeletal: Normal range of motion. No edema and no tenderness.  Lymphadenopathy: No lymphadenopathy noted, cervical, inguinal. Neuro: Alert. Normal reflexes, muscle tone coordination. No cranial nerve deficit. Skin: Skin is warm and dry. Areas of dry skin noted on upper extremities and lower extremities, no maculopapular rash noted  Psychiatric: Normal mood and affect. Behavior, judgment, thought content normal.   Lab Results  Component Value Date   WBC 17.4* 11/02/2010   HGB 13.1 11/02/2010   HCT 37.7* 11/02/2010   MCV 85.5 11/02/2010   PLT 195 11/02/2010   Lab Results  Component Value Date   CREATININE 1.06 03/01/2013   BUN 12 03/01/2013   NA 137 03/01/2013   K 4.3 03/01/2013   CL 102 03/01/2013   CO2 30 03/01/2013    Lab Results  Component Value Date   HGBA1C  Value: 6.0 (NOTE)                                                                       According to the ADA Clinical Practice Recommendations for 2011, when HbA1c is used as a screening test:   >=6.5%   Diagnostic of Diabetes Mellitus           (if abnormal result  is confirmed)  5.7-6.4%   Increased risk of developing Diabetes Mellitus  References:Diagnosis and Classification of Diabetes Mellitus,Diabetes Care,2011,34(Suppl 1):S62-S69 and Standards of Medical Care in         Diabetes - 2011,Diabetes Care,2011,34  (Suppl 1):S11-S61.* 10/31/2010   Lipid Panel     Component Value Date/Time   CHOL 250* 03/01/2013 1434   TRIG 380* 03/01/2013 1434   HDL 32* 03/01/2013 1434   CHOLHDL 7.8 03/01/2013 1434   VLDL 76* 03/01/2013 1434   LDLCALC 142* 03/01/2013 1434       Assessment and plan:   Patient Active Problem List   Diagnosis Date  Noted  .  itching  - unclear etiology at this time, I have advised continuing proper hygiene, will prescribe hydroxyzine, we have discussed potential side effects of blood pressure medicines including lisinopril. I will not discontinue any medicines at this time as this may be transient event. Patient advised to come see [place] if the symptoms persist. 03/01/2013

## 2013-06-03 ENCOUNTER — Ambulatory Visit: Payer: No Typology Code available for payment source

## 2013-06-06 ENCOUNTER — Ambulatory Visit: Payer: No Typology Code available for payment source | Attending: Internal Medicine | Admitting: Internal Medicine

## 2013-06-06 VITALS — BP 138/89 | HR 79 | Temp 98.2°F | Resp 16 | Wt 264.0 lb

## 2013-06-06 DIAGNOSIS — L80 Vitiligo: Secondary | ICD-10-CM | POA: Insufficient documentation

## 2013-06-06 DIAGNOSIS — I10 Essential (primary) hypertension: Secondary | ICD-10-CM | POA: Insufficient documentation

## 2013-06-06 MED ORDER — METRONIDAZOLE 500 MG PO TABS: 500.0000 mg | ORAL_TABLET | Freq: Three times a day (TID) | ORAL | Status: DC

## 2013-06-06 NOTE — Patient Instructions (Signed)
Allergies, Generic  Allergies may happen from anything your body is sensitive to. This may be food, medicines, pollens, chemicals, and nearly anything around you in everyday life that produces allergens. An allergen is anything that causes an allergy producing substance. Heredity is often a factor in causing these problems. This means you may have some of the same allergies as your parents.  Food allergies happen in all age groups. Food allergies are some of the most severe and life threatening. Some common food allergies are cow's milk, seafood, eggs, nuts, wheat, and soybeans.  SYMPTOMS    Swelling around the mouth.   An itchy red rash or hives.   Vomiting or diarrhea.   Difficulty breathing.  SEVERE ALLERGIC REACTIONS ARE LIFE-THREATENING.  This reaction is called anaphylaxis. It can cause the mouth and throat to swell and cause difficulty with breathing and swallowing. In severe reactions only a trace amount of food (for example, peanut oil in a salad) may cause death within seconds.  Seasonal allergies occur in all age groups. These are seasonal because they usually occur during the same season every year. They may be a reaction to molds, grass pollens, or tree pollens. Other causes of problems are house dust mite allergens, pet dander, and mold spores. The symptoms often consist of nasal congestion, a runny itchy nose associated with sneezing, and tearing itchy eyes. There is often an associated itching of the mouth and ears. The problems happen when you come in contact with pollens and other allergens. Allergens are the particles in the air that the body reacts to with an allergic reaction. This causes you to release allergic antibodies. Through a chain of events, these eventually cause you to release histamine into the blood stream. Although it is meant to be protective to the body, it is this release that causes your discomfort. This is why you were given anti-histamines to feel better. If you are  unable to pinpoint the offending allergen, it may be determined by skin or blood testing. Allergies cannot be cured but can be controlled with medicine.  Hay fever is a collection of all or some of the seasonal allergy problems. It may often be treated with simple over-the-counter medicine such as diphenhydramine. Take medicine as directed. Do not drink alcohol or drive while taking this medicine. Check with your caregiver or package insert for child dosages.  If these medicines are not effective, there are many new medicines your caregiver can prescribe. Stronger medicine such as nasal spray, eye drops, and corticosteroids may be used if the first things you try do not work well. Other treatments such as immunotherapy or desensitizing injections can be used if all else fails. Follow up with your caregiver if problems continue. These seasonal allergies are usually not life threatening. They are generally more of a nuisance that can often be handled using medicine.  HOME CARE INSTRUCTIONS    If unsure what causes a reaction, keep a diary of foods eaten and symptoms that follow. Avoid foods that cause reactions.   If hives or rash are present:   Take medicine as directed.   You may use an over-the-counter antihistamine (diphenhydramine) for hives and itching as needed.   Apply cold compresses (cloths) to the skin or take baths in cool water. Avoid hot baths or showers. Heat will make a rash and itching worse.   If you are severely allergic:   Following a treatment for a severe reaction, hospitalization is often required for closer follow-up.     Wear a medic-alert bracelet or necklace stating the allergy.   You and your family must learn how to give adrenaline or use an anaphylaxis kit.   If you have had a severe reaction, always carry your anaphylaxis kit or EpiPen with you. Use this medicine as directed by your caregiver if a severe reaction is occurring. Failure to do so could have a fatal outcome.  SEEK  MEDICAL CARE IF:   You suspect a food allergy. Symptoms generally happen within 30 minutes of eating a food.   Your symptoms have not gone away within 2 days or are getting worse.   You develop new symptoms.   You want to retest yourself or your child with a food or drink you think causes an allergic reaction. Never do this if an anaphylactic reaction to that food or drink has happened before. Only do this under the care of a caregiver.  SEEK IMMEDIATE MEDICAL CARE IF:    You have difficulty breathing, are wheezing, or have a tight feeling in your chest or throat.   You have a swollen mouth, or you have hives, swelling, or itching all over your body.   You have had a severe reaction that has responded to your anaphylaxis kit or an EpiPen. These reactions may return when the medicine has worn off. These reactions should be considered life threatening.  MAKE SURE YOU:    Understand these instructions.   Will watch your condition.   Will get help right away if you are not doing well or get worse.  Document Released: 10/25/2002 Document Revised: 10/24/2011 Document Reviewed: 03/31/2008  ExitCare Patient Information 2014 [employer].

## 2013-06-06 NOTE — Progress Notes (Signed)
Patient was seen for itching and was given  A cream and some benadryl- states still itchy medication not Helping- has been going on for almost a month

## 2013-06-06 NOTE — Progress Notes (Signed)
Patient ID: Peter Russo, male   DOB: Apr 14, 1976, 37 y.o.   MRN: [id number]   CC: ? Trichomonas infection  HPI:  Patient 37-year-old male who presents to clinic with main concern of possible Trichomonas infection. He reports possible exposure via sexual contact. He did not go to STD clinic and would like to get treatment for it. He denies any specific symptoms such as discharge, no pain, no fevers or chills. He also denies history of STDs. Patient would also like to get a referral to dermatology if he still persistently itching. He reports taking medicine hydroxyzine as prescribed and he offers minimum relief.  No Known Allergies Past Medical History  Diagnosis Date  . SVT (supraventricular tachycardia) APRROX 10 YRS AGO    PRESUMED AVNRT, ECHO 12/07 WITH EF 65%, MILD LAE  . GERD (gastroesophageal reflux disease)   . Hypertension    Current Outpatient Prescriptions on File Prior to Visit  Medication Sig Dispense Refill  . aspirin EC 81 MG tablet Take 81 mg by mouth daily.      . diphenhydrAMINE (BENADRYL MAXIMUM STRENGTH) 2 % cream Apply topically 3 (three) times daily as needed for itching.  30 g  3  . esomeprazole (NEXIUM) 20 MG capsule Take 20 mg by mouth daily before breakfast.        . hydrOXYzine (ATARAX/VISTARIL) 10 MG tablet Take 1 tablet (10 mg total) by mouth 3 (three) times daily as needed for itching.  90 tablet  1  . lisinopril (PRINIVIL,ZESTRIL) 5 MG tablet Take 1 tablet (5 mg total) by mouth daily.  30 tablet  4  . metoprolol succinate (TOPROL-XL) 25 MG 24 hr tablet Take 1 tablet (25 mg total) by mouth daily.  30 tablet  3  . Multiple Vitamin (MULTIVITAMIN) tablet Take 1 tablet by mouth daily.        . simvastatin (ZOCOR) 80 MG tablet Take 1 tablet (80 mg total) by mouth at bedtime.  30 tablet  4   No current facility-administered medications on file prior to visit.   Family History  Problem Relation Age of Onset  . Arrhythmia Mother     MOTHER LIVED TO BE 102  .  Coronary artery disease Father 108  . Heart disease Father    History   Social History  . Marital Status: Single    Spouse Name: N/A    Number of Children: N/A  . Years of Education: N/A   Occupational History  . ACCOUNTANT    Social History Main Topics  . Smoking status: Current Every Day Smoker -- 1.50 packs/day    Types: Cigarettes  . Smokeless tobacco: Not on file  . Alcohol Use: No  . Drug Use: No  . Sexual Activity: Not on file   Other Topics Concern  . Not on file   Social History Narrative   REGULAR EXERCISE    Review of Systems  Constitutional: Negative for fever, chills, diaphoresis, activity change, appetite change and fatigue.  HENT: Negative for ear pain, nosebleeds, congestion, facial swelling, rhinorrhea, neck pain, neck stiffness and ear discharge.   Eyes: Negative for pain, discharge, redness, itching and visual disturbance.  Respiratory: Negative for cough, choking, chest tightness, shortness of breath, wheezing and stridor.   Cardiovascular: Negative for chest pain, palpitations and leg swelling.  Gastrointestinal: Negative for abdominal distention.  Genitourinary: Negative for dysuria, urgency, frequency, hematuria, flank pain, decreased urine volume, difficulty urinating and dyspareunia.  Musculoskeletal: Negative for back pain, joint swelling, arthralgias and gait  problem.  Neurological: Negative for dizziness, tremors, seizures, syncope, facial asymmetry, speech difficulty, weakness, light-headedness, numbness and headaches.  Hematological: Negative for adenopathy. Does not bruise/bleed easily.  Psychiatric/Behavioral: Negative for hallucinations, behavioral problems, confusion, dysphoric mood, decreased concentration and agitation.    Objective:   Filed Vitals:   06/06/13 1008  BP: 138/89  Pulse: 79  Temp: 98.2 F (36.8 C)  Resp: 16    Physical Exam  Constitutional: Appears well-developed and well-nourished. No distress.  CVS: RRR, S1/S2  +, no murmurs, no gallops, no carotid bruit.  Pulmonary: Effort and breath sounds normal, no stridor, rhonchi, wheezes, rales.  Abdominal: Soft. BS +,  no distension, tenderness, rebound or guarding.  Skin: Areas of hyperpigmentation noted, areas of excoriation due to scratching, scattered throughout upper and lower extremities and abdomen. Vitiligo noted.  Psychiatric: Normal mood and affect. Behavior, judgment, thought content normal.   Lab Results  Component Value Date   WBC 17.4* 11/02/2010   HGB 13.1 11/02/2010   HCT 37.7* 11/02/2010   MCV 85.5 11/02/2010   PLT 195 11/02/2010   Lab Results  Component Value Date   CREATININE 1.06 03/01/2013   BUN 12 03/01/2013   NA 137 03/01/2013   K 4.3 03/01/2013   CL 102 03/01/2013   CO2 30 03/01/2013    Lab Results  Component Value Date   HGBA1C  Value: 6.0 (NOTE)                                                                       According to the ADA Clinical Practice Recommendations for 2011, when HbA1c is used as a screening test:   >=6.5%   Diagnostic of Diabetes Mellitus           (if abnormal result  is confirmed)  5.7-6.4%   Increased risk of developing Diabetes Mellitus  References:Diagnosis and Classification of Diabetes Mellitus,Diabetes Care,2011,34(Suppl 1):S62-S69 and Standards of Medical Care in         Diabetes - 2011,Diabetes Care,2011,34  (Suppl 1):S11-S61.* 10/31/2010   Lipid Panel     Component Value Date/Time   CHOL 250* 03/01/2013 1434   TRIG 380* 03/01/2013 1434   HDL 32* 03/01/2013 1434   CHOLHDL 7.8 03/01/2013 1434   VLDL 76* 03/01/2013 1434   LDLCALC 142* 03/01/2013 1434       Assessment and plan:   Patient Active Problem List   Diagnosis Date Noted  .  possible Trichomonas  - we have no test in the clinic to test for it, patient does not want to go to the clinic STD and we have discussed empiric treatment with metronidazole 500 mg tablet twice daily for 7 days. He is agreeable to treatment. I have recommended screening  for other STDs the patient refuses at this time. I have advised the visit to STD clinic  03/01/2013  .  vitiligo - referral to dermatology. Possible itching is coming from the actual vitiligo. Patient denies any diagnosis with skin condition in the past. We have discussed vitiligo symptoms and presentation. Advised dermatology visit and referral provided.  03/14/2011  . Smoking - cessation discussed in detail  03/14/2011  . Hypertension 03/14/2011  . Mixed hyperlipidemia 03/14/2011

## 2013-09-10 ENCOUNTER — Ambulatory Visit: Payer: Self-pay | Attending: Internal Medicine

## 2013-09-12 ENCOUNTER — Ambulatory Visit: Payer: Self-pay

## 2013-10-28 ENCOUNTER — Emergency Department (HOSPITAL_COMMUNITY)
Admission: EM | Admit: 2013-10-28 | Discharge: 2013-10-28 | Disposition: A | Discharge status: Home / Self Care | Payer: Self-pay | Attending: Emergency Medicine | Admitting: Emergency Medicine

## 2013-10-28 ENCOUNTER — Encounter (HOSPITAL_COMMUNITY): Payer: Self-pay | Admitting: Emergency Medicine

## 2013-10-28 DIAGNOSIS — B309 Viral conjunctivitis, unspecified: Secondary | ICD-10-CM | POA: Insufficient documentation

## 2013-10-28 DIAGNOSIS — Z9861 Coronary angioplasty status: Secondary | ICD-10-CM | POA: Insufficient documentation

## 2013-10-28 DIAGNOSIS — Z8639 Personal history of other endocrine, nutritional and metabolic disease: Secondary | ICD-10-CM | POA: Insufficient documentation

## 2013-10-28 DIAGNOSIS — Z862 Personal history of diseases of the blood and blood-forming organs and certain disorders involving the immune mechanism: Secondary | ICD-10-CM | POA: Insufficient documentation

## 2013-10-28 DIAGNOSIS — F17200 Nicotine dependence, unspecified, uncomplicated: Secondary | ICD-10-CM | POA: Insufficient documentation

## 2013-10-28 DIAGNOSIS — Z8719 Personal history of other diseases of the digestive system: Secondary | ICD-10-CM | POA: Insufficient documentation

## 2013-10-28 DIAGNOSIS — Z7982 Long term (current) use of aspirin: Secondary | ICD-10-CM | POA: Insufficient documentation

## 2013-10-28 DIAGNOSIS — I10 Essential (primary) hypertension: Secondary | ICD-10-CM | POA: Insufficient documentation

## 2013-10-28 MED ORDER — NAPHAZOLINE-PHENIRAMINE 0.025-0.3 % OP SOLN: 1.0000 [drp] | Freq: Four times a day (QID) | OPHTHALMIC | Status: DC | PRN

## 2013-10-28 MED ORDER — LORATADINE 10 MG PO TABS: 10.0000 mg | ORAL_TABLET | Freq: Every day | ORAL | Status: DC | PRN

## 2013-10-28 NOTE — Discharge Instructions (Signed)
Please follow up with your primary care physician in 1-2 days. If you do not have one please call the [facility] number listed above. Please use drops as prescribed. Please use the Claritin as prescribed. Please read all discharge instructions and return precautions.    Conjunctivitis Conjunctivitis is commonly called "pink eye." Conjunctivitis can be caused by bacterial or viral infection, allergies, or injuries. There is usually redness of the lining of the eye, itching, discomfort, and sometimes discharge. There may be deposits of matter along the eyelids. A viral infection usually causes a watery discharge, while a bacterial infection causes a yellowish, thick discharge. Pink eye is very contagious and spreads by direct contact. You may be given antibiotic eyedrops as part of your treatment. Before using your eye medicine, remove all drainage from the eye by washing gently with warm water and cotton balls. Continue to use the medication until you have awakened 2 mornings in a row without discharge from the eye. Do not rub your eye. This increases the irritation and helps spread infection. Use separate towels from other household members. Wash your hands with soap and water before and after touching your eyes. Use cold compresses to reduce pain and sunglasses to relieve irritation from light. Do not wear contact lenses or wear eye makeup until the infection is gone. SEEK MEDICAL CARE IF:   Your symptoms are not better after 3 days of treatment.  You have increased pain or trouble seeing.  The outer eyelids become very red or swollen. Document Released: 09/08/2004 Document Revised: 10/24/2011 Document Reviewed: 08/01/2005 [facility] Patient Information 2014 [place] [place].

## 2013-10-28 NOTE — ED Provider Notes (Signed)
CSN: [id number]     Arrival date & time 10/28/13  2012 History   This chart was scribed for non-physician practitioner Jeannetta EllisJennifer L Sunaina Ferrando, PA-C working with Gavin PoundMichael Y. Oletta LamasGhim, MD by Donne Anonayla Curran, ED Scribe. This patient was seen in room WTR5/WTR5 and the patient's care was started at 2114.   First MD Initiated Contact with Patient 10/28/13 2114     Chief Complaint  Patient presents with  . Eye Problem    The history is provided by the patient. No language interpreter was used.   HPI Comments: Peter BloomMark E Manchester is a 38 y.o. male with hx of SVT, GERD, HLD and HTN, who presents to the Emergency Department complaining of 2 days of gradual onset, gradually worsening right eye itching and watering. He states today his left eye began to itch as well. He reports his eye is crusted in the morning, and denies significant drainage during the day. Denies any purulent drainage. He has tried OTC eye drops with mild relief. He denies eye pain, visual disturbances, fever, chills, body aches, rhinorrhea, ear pain, sore throat, cough or any other symptoms. He does not wear contact lenses or glasses. He believes he may have seasonal allergies.   He currently smokes 1.5 packs/day. He does not currently have a PCP.  Past Medical History  Diagnosis Date  . SVT (supraventricular tachycardia) APRROX 10 YRS AGO    PRESUMED AVNRT, ECHO 12/07 WITH EF 65%, MILD LAE  . GERD (gastroesophageal reflux disease)   . Hypertension    Past Surgical History  Procedure Laterality Date  . Coronary stent placement     Family History  Problem Relation Age of Onset  . Arrhythmia Mother     MOTHER LIVED TO BE 102  . Coronary artery disease Father [id number]  . Heart disease Father    History  Substance Use Topics  . Smoking status: Current Every Day Smoker -- 1.50 packs/day    Types: Cigarettes  . Smokeless tobacco: Not on file  . Alcohol Use: No    Review of Systems  Constitutional: Negative for fever, chills and  fatigue.  HENT: Negative for congestion, ear pain, rhinorrhea and sore throat.   Eyes: Positive for discharge and itching. Negative for pain and visual disturbance.  Respiratory: Negative for cough.   Gastrointestinal: Negative for nausea and vomiting.  All other systems reviewed and are negative.      Allergies  Review of patient's allergies indicates no known allergies.  Home Medications   Current Outpatient Rx  Name  Route  Sig  Dispense  Refill  . aspirin EC 81 MG tablet   Oral   Take 81 mg by mouth daily.         Marland Kitchen. loratadine (CLARITIN) 10 MG tablet   Oral   Take 1 tablet (10 mg total) by mouth daily as needed for allergies, rhinitis or itching.   30 tablet   0   . naphazoline-pheniramine (NAPHCON-A) 0.025-0.3 % ophthalmic solution   Both Eyes   Place 1 drop into both eyes every 6 (six) hours as needed for irritation or allergies.   15 mL   0    BP 144/80  Pulse 84  Temp(Src) 98.1 F (36.7 C) (Oral)  Resp 18  SpO2 99%  Physical Exam  Nursing note and vitals reviewed. Constitutional: He is oriented to person, place, and time. He appears well-developed and well-nourished. No distress.  HENT:  Head: Normocephalic and atraumatic.  Right Ear: External ear normal.  Left  Ear: External ear normal.  Nose: Nose normal.  Mouth/Throat: Oropharynx is clear and moist.  Eyes: EOM are normal. Pupils are equal, round, and reactive to light. Lids are everted and swept, no foreign bodies found. Right eye exhibits discharge. Right eye exhibits no exudate. No foreign body present in the right eye. Left eye exhibits discharge. Left eye exhibits no exudate. No foreign body present in the left eye. Right conjunctiva is injected. Right conjunctiva has no hemorrhage. Left conjunctiva is injected. Left conjunctiva has no hemorrhage.  Fundoscopic exam:      The right eye shows no exudate, no hemorrhage and no papilledema.       The left eye shows no exudate, no hemorrhage and no  papilledema.  Mildly injected bilateral eyes. Mild watery discharge.  Visual Acuity - Bilateral Distance: 20/15 ; R Distance: 20/20 ; L Distance: 20/20  Neck: Normal range of motion. Neck supple.  Cardiovascular: Normal rate.   Pulmonary/Chest: Effort normal.  Abdominal: Soft.  Musculoskeletal: Normal range of motion.  Neurological: He is alert and oriented to person, place, and time.  Skin: Skin is warm and dry. He is not diaphoretic.  Psychiatric: He has a normal mood and affect.    ED Course  Procedures (including critical care time) DIAGNOSTIC STUDIES: Oxygen Saturation is 99% on RA, normal by my interpretation.    COORDINATION OF CARE: 10:17 PM Discussed treatment plan which includes Naphcon-A ophthalmic solution with pt at bedside and pt agreed to plan. Advised pt to take 10 mg Claritin for allergies. Return precautions advised.    Labs Review Labs Reviewed - No data to display Imaging Review No results found.   EKG Interpretation None      MDM   Final diagnoses:  Viral conjunctivitis    Filed Vitals:   10/28/13 2209  BP: 144/80  Pulse: 84  Temp: 98.1 F (36.7 C)  Resp: 18    Afebrile, NAD, non-toxic appearing, AAOx4. Visual Acuity - Bilateral Distance: 20/15 ; R Distance: 20/20 ; L Distance: 20/20  Viral conjunctivitis  Patient presentation consistent with viral conjunctivitis.  No purulent discharge, entrapment, consensual photophobia.  Presentation non-concerning for iritis, bacterial conjunctivitis, corneal abrasions, or HSV.  No antibiotics are indicated and patient will be prescribed naphazoline for itching.  Personal hygiene and frequent handwashing discussed.  Patient advised to followup with ophthalmologist if symptoms persist or worsen in any way including vision change or purulent discharge.  Patient verbalizes understanding and is agreeable with discharge.  I personally performed the services described in this documentation, which was scribed in  my presence. The recorded information has been reviewed and is accurate.    Lise Auer Chade Pitner, PA-C 10/29/13 0025

## 2013-10-28 NOTE — ED Notes (Signed)
Pt states his right eye has been red and itchy for the past 2 days, he states his left eye is starting to itch as well.

## 2013-11-07 NOTE — ED Provider Notes (Signed)
Medical screening examination/treatment/procedure(s) were performed by non-physician practitioner and as supervising physician I was immediately available for consultation/collaboration.  Tru Leopard Y. Elah Avellino, MD 11/07/13 1539 

## 2014-12-19 ENCOUNTER — Ambulatory Visit (HOSPITAL_COMMUNITY)
Admit: 2014-12-19 | Discharge: 2014-12-19 | Disposition: A | Discharge status: Home / Self Care | Payer: Self-pay | Attending: Internal Medicine | Admitting: Internal Medicine

## 2014-12-19 ENCOUNTER — Encounter (HOSPITAL_COMMUNITY): Payer: Self-pay | Admitting: Nurse Practitioner

## 2014-12-19 ENCOUNTER — Encounter: Payer: Self-pay | Admitting: Internal Medicine

## 2014-12-19 ENCOUNTER — Ambulatory Visit: Payer: Self-pay | Attending: Internal Medicine | Admitting: Internal Medicine

## 2014-12-19 ENCOUNTER — Emergency Department (HOSPITAL_COMMUNITY)
Admission: EM | Admit: 2014-12-19 | Discharge: 2014-12-19 | Disposition: A | Discharge status: Home / Self Care | Payer: Self-pay | Attending: Emergency Medicine | Admitting: Emergency Medicine

## 2014-12-19 ENCOUNTER — Emergency Department (HOSPITAL_COMMUNITY): Payer: Self-pay

## 2014-12-19 ENCOUNTER — Other Ambulatory Visit: Payer: Self-pay

## 2014-12-19 VITALS — BP 143/81 | HR 77 | Temp 98.3°F | Resp 17 | Ht 67.0 in | Wt 265.0 lb

## 2014-12-19 DIAGNOSIS — Z7982 Long term (current) use of aspirin: Secondary | ICD-10-CM | POA: Insufficient documentation

## 2014-12-19 DIAGNOSIS — I10 Essential (primary) hypertension: Secondary | ICD-10-CM | POA: Insufficient documentation

## 2014-12-19 DIAGNOSIS — Z720 Tobacco use: Secondary | ICD-10-CM | POA: Insufficient documentation

## 2014-12-19 DIAGNOSIS — R7309 Other abnormal glucose: Secondary | ICD-10-CM | POA: Insufficient documentation

## 2014-12-19 DIAGNOSIS — F17210 Nicotine dependence, cigarettes, uncomplicated: Secondary | ICD-10-CM | POA: Insufficient documentation

## 2014-12-19 DIAGNOSIS — Z6841 Body Mass Index (BMI) 40.0 and over, adult: Secondary | ICD-10-CM | POA: Insufficient documentation

## 2014-12-19 DIAGNOSIS — E669 Obesity, unspecified: Secondary | ICD-10-CM | POA: Insufficient documentation

## 2014-12-19 DIAGNOSIS — I252 Old myocardial infarction: Secondary | ICD-10-CM | POA: Insufficient documentation

## 2014-12-19 DIAGNOSIS — R079 Chest pain, unspecified: Secondary | ICD-10-CM | POA: Insufficient documentation

## 2014-12-19 DIAGNOSIS — Z8639 Personal history of other endocrine, nutritional and metabolic disease: Secondary | ICD-10-CM | POA: Insufficient documentation

## 2014-12-19 DIAGNOSIS — Z9861 Coronary angioplasty status: Secondary | ICD-10-CM | POA: Insufficient documentation

## 2014-12-19 DIAGNOSIS — Z8719 Personal history of other diseases of the digestive system: Secondary | ICD-10-CM | POA: Insufficient documentation

## 2014-12-19 DIAGNOSIS — Z8249 Family history of ischemic heart disease and other diseases of the circulatory system: Secondary | ICD-10-CM | POA: Insufficient documentation

## 2014-12-19 HISTORY — DX: Pure hypercholesterolemia, unspecified: E78.00

## 2014-12-19 LAB — BASIC METABOLIC PANEL WITH GFR
Anion gap: 9 (ref 5–15)
BUN: 10 mg/dL (ref 6–20)
CO2: 26 mmol/L (ref 22–32)
Calcium: 9.3 mg/dL (ref 8.9–10.3)
Chloride: 105 mmol/L (ref 101–111)
Creatinine, Ser: 1.1 mg/dL (ref 0.61–1.24)
GFR calc Af Amer: >60 mL/min
GFR calc non Af Amer: >60 mL/min
Glucose, Bld: 117 mg/dL — ABNORMAL HIGH (ref 70–99)
Potassium: 4.1 mmol/L (ref 3.5–5.1)
Sodium: 140 mmol/L (ref 135–145)

## 2014-12-19 LAB — I-STAT TROPONIN, ED
Troponin i, poc: 0 ng/mL (ref 0.00–0.08)
Troponin i, poc: 0 ng/mL (ref 0.00–0.08)

## 2014-12-19 LAB — CBC
HCT: 42.7 % (ref 39.0–52.0)
Hemoglobin: 15 g/dL (ref 13.0–17.0)
MCH: 29.8 pg (ref 26.0–34.0)
MCHC: 35.1 g/dL (ref 30.0–36.0)
MCV: 84.9 fL (ref 78.0–100.0)
Platelets: 217 10*3/uL (ref 150–400)
RBC: 5.03 MIL/uL (ref 4.22–5.81)
RDW: 13.6 % (ref 11.5–15.5)
WBC: 10.4 10*3/uL (ref 4.0–10.5)

## 2014-12-19 NOTE — ED Provider Notes (Signed)
CSN: [id number]     Arrival date & time 12/19/14  1659 History   First MD Initiated Contact with Patient 12/19/14 1712     Chief Complaint  Patient presents with  . Chest Pain     (Consider location/radiation/quality/duration/timing/severity/associated sxs/prior Treatment) HPI Comments: 39 year old male with history of smoking, high blood pressure, heart attack with stents 2010 presents with intermittent chest pain lasting a few seconds at a time. Patient has this every few days, nonradiating, no diaphoresis, no exertional symptoms. Patient has not followed up with cardiology in the past 2 years due to financial reasons, patient has not take his medicines in over a year. Patient currently has no chest pain. Patient was sent from urgent care for possible EKG changes. Patient currently feels well. Patient minutes that ever since his heart attack he gets anxious for any discomfort  Patient is a 39 y.o. male presenting with chest pain. The history is provided by the patient.  Chest Pain Associated symptoms: no abdominal pain, no back pain, no fever, no headache, no shortness of breath and not vomiting     Past Medical History  Diagnosis Date  . SVT (supraventricular tachycardia) APRROX 10 YRS AGO    PRESUMED AVNRT, ECHO 12/07 WITH EF 65%, MILD LAE  . GERD (gastroesophageal reflux disease)   . Hypertension   . Myocardial infarct   . Hypercholesteremia    Past Surgical History  Procedure Laterality Date  . Coronary stent placement     Family History  Problem Relation Age of Onset  . Arrhythmia Mother     MOTHER LIVED TO BE 102  . Coronary artery disease Father [id number]  . Heart disease Father    History  Substance Use Topics  . Smoking status: Current Every Day Smoker -- 1.50 packs/day    Types: Cigarettes  . Smokeless tobacco: Not on file  . Alcohol Use: No    Review of Systems  Constitutional: Negative for fever and chills.  HENT: Negative for congestion.   Eyes: Negative for  visual disturbance.  Respiratory: Negative for shortness of breath.   Cardiovascular: Positive for chest pain.  Gastrointestinal: Negative for vomiting and abdominal pain.  Genitourinary: Negative for dysuria and flank pain.  Musculoskeletal: Negative for back pain, neck pain and neck stiffness.  Skin: Negative for rash.  Neurological: Negative for light-headedness and headaches.      Allergies  Review of patient's allergies indicates no known allergies.  Home Medications   Prior to Admission medications   Medication Sig Start Date End Date Taking? Authorizing Provider  aspirin EC 81 MG tablet Take 81 mg by mouth daily.   Yes Historical Provider, MD  loratadine (CLARITIN) 10 MG tablet Take 1 tablet (10 mg total) by mouth daily as needed for allergies, rhinitis or itching. Patient not taking: Reported on 12/19/2014 10/28/13   Francee PiccoloJennifer Piepenbrink, PA-C  naphazoline-pheniramine (NAPHCON-A) 0.025-0.3 % ophthalmic solution Place 1 drop into both eyes every 6 (six) hours as needed for irritation or allergies. Patient not taking: Reported on 12/19/2014 10/28/13   Victorino DikeJennifer Piepenbrink, PA-C   BP 112/54 mmHg  Pulse 64  Temp(Src) 99.2 F (37.3 C) (Oral)  Resp 24  Ht 5\' 7"  (1.702 m)  Wt 264 lb 8 oz (119.976 kg)  BMI 41.42 kg/m2  SpO2 94% Physical Exam  Constitutional: He is oriented to person, place, and time. He appears well-developed and well-nourished.  HENT:  Head: Normocephalic and atraumatic.  Eyes: Conjunctivae are normal. Right eye exhibits no discharge. Left eye  exhibits no discharge.  Neck: Normal range of motion. Neck supple. No tracheal deviation present.  Cardiovascular: Normal rate, regular rhythm and intact distal pulses.   Pulmonary/Chest: Effort normal and breath sounds normal.  Abdominal: Soft. He exhibits no distension. There is no tenderness. There is no guarding.  Musculoskeletal: He exhibits no edema or tenderness.  Neurological: He is alert and oriented to person,  place, and time.  Skin: Skin is warm. No rash noted.  Psychiatric: He has a normal mood and affect.  Nursing note and vitals reviewed.   ED Course  Procedures (including critical care time) Labs Review Labs Reviewed  BASIC METABOLIC PANEL - Abnormal; Notable for the following:    Glucose, Bld 117 (*)    All other components within normal limits  CBC  I-STAT TROPOININ, ED  Rosezena SensorI-STAT TROPOININ, ED    Imaging Review Dg Chest 2 View  12/19/2014   CLINICAL DATA:  Intermittent chest pain for 1 year.  Tobacco use.  EXAM: CHEST  2 VIEW  COMPARISON:  02/05/2012  FINDINGS: Subsegmental atelectasis or scarring in the lingula. The lungs appear otherwise clear. Cardiac and mediastinal margins appear normal. No pleural effusion.  IMPRESSION: 1. Subsegmental atelectasis or scarring in the lingula.   Electronically Signed   By: Gaylyn RongWalter  Liebkemann M.D.   On: 12/19/2014 17:43     EKG Interpretation   Date/Time:  Friday Dec 19 2014 17:07:36 EDT Ventricular Rate:  86 PR Interval:  150 QRS Duration: 96 QT Interval:  378 QTC Calculation: 452 R Axis:   49 Text Interpretation:  Normal sinus rhythm Cannot rule out Anterior infarct  , age undetermined Abnormal ECG Confirmed by Leno Mathes  MD, Tandi Hanko ([id number]) on  12/19/2014 6:18:16 PM      MDM   Final diagnoses:  Chest pain, unspecified chest pain type   Patient presents with atypical chest pain however he does have cardiac history. Troponin delta negative.    occupational patient never had chest pain in ER. Discussed with cardiology for close outpatient follow-up in the clinic, patient agrees with that plan and says that he prefers to follow-up outpatient no matter what with a heart doctor. Cardiology saw in the ED.   Results and differential diagnosis were discussed with the patient/parent/guardian. Close follow up outpatient was discussed, comfortable with the plan.   Medications - No data to display  Filed Vitals:   12/19/14 1915 12/19/14 1917  12/19/14 1930 12/19/14 2000  BP: 120/66 120/66 115/51 112/54  Pulse: 75 72 72 64  Temp:      TempSrc:      Resp: 20 18 19 24   Height:      Weight:      SpO2: 97% 97% 97% 94%    Final diagnoses:  Chest pain, unspecified chest pain type      Blane OharaJoshua Demitri Kucinski, MD 12/19/14 2039

## 2014-12-19 NOTE — ED Notes (Addendum)
Pt ambulated to bathroom 

## 2014-12-19 NOTE — Progress Notes (Signed)
Pt is here to re-establish care. Pt has a history of HTN and heart disease. Pt states that he away's has some chest pain.

## 2014-12-19 NOTE — ED Notes (Signed)
Pt off unit with xray 

## 2014-12-19 NOTE — Progress Notes (Signed)
Patient ID: Peter Russo, male   DOB: Mar 07, 1976, 39 y.o.   MRN: [id number]  CC: chest pain, f/u   HPI: Peter Russo is a 39 y.o. male here today for a follow up visit.  Patient has past medical history of hypertension. MI * 2, tobacco use, obesity, and prediabetes.  Patient reports that he has been having chest pains for the past few days. The pain has been more severe but is now rated as a 2/10 on pain scale. He currently denies radiating pain and SOB. He reports that his father died in his early 7850's of a MI and a brother who died at age 41 from a MI. He has one more brother who suffered a heart attack a few years ago and a mother with heart disease. He currently smokes 1 ppd. 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   Patient has No headache, No abdominal pain - No Nausea, No new weakness tingling or numbness, No Cough - SOB.  No Known Allergies Past Medical History  Diagnosis Date  . SVT (supraventricular tachycardia) APRROX 10 YRS AGO    PRESUMED AVNRT, ECHO 12/07 WITH EF 65%, MILD LAE  . GERD (gastroesophageal reflux disease)   . Hypertension    Current Outpatient Prescriptions on File Prior to Visit  Medication Sig Dispense Refill  . aspirin EC 81 MG tablet Take 81 mg by mouth daily.    Marland Kitchen. loratadine (CLARITIN) 10 MG tablet Take 1 tablet (10 mg total) by mouth daily as needed for allergies, rhinitis or itching. (Patient not taking: Reported on 12/19/2014) 30 tablet 0  . naphazoline-pheniramine (NAPHCON-A) 0.025-0.3 % ophthalmic solution Place 1 drop into both eyes every 6 (six) hours as needed for irritation or allergies. (Patient not taking: Reported on 12/19/2014) 15 mL 0   No current facility-administered medications on file prior to visit.   Family History  Problem Relation Age of Onset  . Arrhythmia Mother     MOTHER LIVED TO BE 102  . Coronary artery disease Father 854  . Heart disease  Father    History   Social History  . Marital Status: Single    Spouse Name: N/A  . Number of Children: N/A  . Years of Education: N/A   Occupational History  . ACCOUNTANT    Social History Main Topics  . Smoking status: Current Every Day Smoker -- 1.50 packs/day    Types: Cigarettes  . Smokeless tobacco: Not on file  . Alcohol Use: No  . Drug Use: No  . Sexual Activity: Not on file   Other Topics Concern  . Not on file   Social History Narrative   REGULAR EXERCISE    Review of Systems: See HPI   Objective:   Filed Vitals:   12/19/14 1556  BP: 143/81  Pulse: 77  Temp: 98.3 F (36.8 C)  Resp: 17    Physical Exam  Constitutional: He is oriented to person, place, and time. No distress.  Cardiovascular: Normal rate, regular rhythm and normal heart sounds.   Pulmonary/Chest: Effort normal and breath sounds normal.  Neurological: He is alert and oriented to person, place, and time.  Skin: Skin is warm and dry.     Lab Results  Component Value Date   WBC 17.4* 11/02/2010   HGB 13.1 11/02/2010   HCT 37.7* 11/02/2010   MCV 85.5 11/02/2010   PLT 195 11/02/2010  Lab Results  Component Value Date   CREATININE 1.06 03/01/2013   BUN 12 03/01/2013   NA 137 03/01/2013   K 4.3 03/01/2013   CL 102 03/01/2013   CO2 30 03/01/2013    Lab Results  Component Value Date   HGBA1C * 10/31/2010    6.0 (NOTE)                                                                       According to the ADA Clinical Practice Recommendations for 2011, when HbA1c is used as a screening test:   >=6.5%   Diagnostic of Diabetes Mellitus           (if abnormal result  is confirmed)  5.7-6.4%   Increased risk of developing Diabetes Mellitus  References:Diagnosis and Classification of Diabetes Mellitus,Diabetes Care,2011,34(Suppl 1):S62-S69 and Standards of Medical Care in         Diabetes - 2011,Diabetes Care,2011,34  (Suppl 1):S11-S61.   Lipid Panel     Component Value Date/Time    CHOL 250* 03/01/2013 1434   TRIG 380* 03/01/2013 1434   HDL 32* 03/01/2013 1434   CHOLHDL 7.8 03/01/2013 1434   VLDL 76* 03/01/2013 1434   LDLCALC 142* 03/01/2013 1434       Assessment and plan:   Loraine LericheMark was seen today for establish care.  Diagnoses and all orders for this visit:  Chest pain, unspecified chest pain type Orders: -     EKG 12-Lead EKG: Q waves inversion in lead 3.  I have reviewed EKG from 2012 and 2014.  This is a new finding and I am unsure if the patient has had a repeat MI. He has several risk factors including AA male, obesity, HTN, smoker, prediabetic, and situational stress.  I have sent patient to ER for serial troponin's and f/u test to determine if this is a new event.  Patient will need to RTC within 1 week of discharge and I will obtain all labs and get him started on medications for chronic disease. I have advised patient to quit smoking asap.        Holland CommonsKECK, VALERIE, NP-C [facility] [phone] 12/19/2014, 4:48 PM

## 2014-12-19 NOTE — ED Notes (Signed)
hes been having CP for "a while." he went to [facility] today and they told him his EKG was abnormal and he should come to ED. He reports he has been out of all his daily medications for 1 year. He has no pain currently. He has had 2 heart attacks in past

## 2014-12-19 NOTE — Discharge Instructions (Signed)
Take aspirin daily. Return to the ER if you chest pain becomes persistent, worse than usual or any other concerns. Return to the ER for change of mind and want observation in the hospital.  Follow-up closely with cardiology as directed.  If you were given medicines take as directed.  If you are on coumadin or contraceptives realize their levels and effectiveness is altered by many different medicines.  If you have any reaction (rash, tongues swelling, other) to the medicines stop taking and see a physician.   Please follow up as directed and return to the ER or see a physician for new or worsening symptoms.  Thank you. Filed Vitals:   12/19/14 1708 12/19/14 1738 12/19/14 1743 12/19/14 1745  BP: 150/80 127/57  116/65  Pulse: 84 70  84  Temp: 99.2 F (37.3 C)     TempSrc: Oral     Resp: 16  16 20   Height: 5\' 7"  (1.702 m)     Weight: 264 lb 8 oz (119.976 kg)     SpO2: 97% 98%  96%

## 2014-12-22 ENCOUNTER — Telehealth: Payer: Self-pay | Admitting: *Deleted

## 2014-12-22 NOTE — Telephone Encounter (Signed)
-----   Message from Ambrose FinlandValerie A Keck, NP sent at 12/21/2014 11:16 PM EDT ----- Call patient and make sure he gets in this week for follow up since his ER visit. See if he can get a morning appointment and come fasting for blood work. Thanks

## 2014-12-22 NOTE — Telephone Encounter (Signed)
Pt's phone went went straight to busy tone.

## 2014-12-26 ENCOUNTER — Ambulatory Visit: Payer: Self-pay | Attending: Internal Medicine | Admitting: Internal Medicine

## 2014-12-26 ENCOUNTER — Encounter: Payer: Self-pay | Admitting: Internal Medicine

## 2014-12-26 VITALS — BP 137/84 | HR 71 | Temp 98.1°F | Ht 67.0 in | Wt 266.0 lb

## 2014-12-26 DIAGNOSIS — F32A Depression, unspecified: Secondary | ICD-10-CM | POA: Insufficient documentation

## 2014-12-26 DIAGNOSIS — L309 Dermatitis, unspecified: Secondary | ICD-10-CM

## 2014-12-26 DIAGNOSIS — I252 Old myocardial infarction: Secondary | ICD-10-CM

## 2014-12-26 DIAGNOSIS — Z720 Tobacco use: Secondary | ICD-10-CM

## 2014-12-26 DIAGNOSIS — F329 Major depressive disorder, single episode, unspecified: Secondary | ICD-10-CM

## 2014-12-26 DIAGNOSIS — F17200 Nicotine dependence, unspecified, uncomplicated: Secondary | ICD-10-CM

## 2014-12-26 DIAGNOSIS — F411 Generalized anxiety disorder: Secondary | ICD-10-CM

## 2014-12-26 DIAGNOSIS — E669 Obesity, unspecified: Secondary | ICD-10-CM

## 2014-12-26 DIAGNOSIS — I10 Essential (primary) hypertension: Secondary | ICD-10-CM

## 2014-12-26 LAB — HEMOGLOBIN A1C
Hgb A1c MFr Bld: 6.2 % — ABNORMAL HIGH (ref <5.7)
Mean Plasma Glucose: 131 mg/dL — ABNORMAL HIGH (ref <117)

## 2014-12-26 LAB — LIPID PANEL
Cholesterol: 259 mg/dL — ABNORMAL HIGH (ref 0–200)
HDL: 32 mg/dL — ABNORMAL LOW (ref >=40)
LDL Cholesterol: 170 mg/dL — ABNORMAL HIGH (ref 0–99)
Total CHOL/HDL Ratio: 8.1 Ratio
Triglycerides: 286 mg/dL — ABNORMAL HIGH (ref <150)
VLDL: 57 mg/dL — ABNORMAL HIGH (ref 0–40)

## 2014-12-26 LAB — TSH: TSH: 0.534 u[IU]/mL (ref 0.350–4.500)

## 2014-12-26 MED ORDER — SIMVASTATIN 80 MG PO TABS: 80.0000 mg | ORAL_TABLET | Freq: Every day | ORAL | Status: DC

## 2014-12-26 MED ORDER — TRIAMCINOLONE ACETONIDE 0.1 % EX CREA: 1.0000 "application " | TOPICAL_CREAM | Freq: Two times a day (BID) | CUTANEOUS | Status: DC

## 2014-12-26 MED ORDER — SERTRALINE HCL 50 MG PO TABS: 50.0000 mg | ORAL_TABLET | Freq: Every day | ORAL | Status: DC

## 2014-12-26 MED ORDER — LISINOPRIL 5 MG PO TABS: 5.0000 mg | ORAL_TABLET | Freq: Every day | ORAL | Status: DC

## 2014-12-26 MED ORDER — ALPRAZOLAM 0.5 MG PO TABS: 0.5000 mg | ORAL_TABLET | Freq: Two times a day (BID) | ORAL | Status: DC | PRN

## 2014-12-26 NOTE — Patient Instructions (Signed)
Be sure to apply for the [place] discount and orange card- ask about the applications at the front desk. As soon as you get it I will send you a cardiology referral. Stop smoking ASAP!!!!!  Begin a exercise regimen, this will help with weight and stress  Smoking Cessation Quitting smoking is important to your health and has many advantages. However, it is not always easy to quit since nicotine is a very addictive drug. Oftentimes, people try 3 times or more before being able to quit. This document explains the best ways for you to prepare to quit smoking. Quitting takes hard work and a lot of effort, but you can do it. ADVANTAGES OF QUITTING SMOKING  You will live longer, feel better, and live better.  Your body will feel the impact of quitting smoking almost immediately.  Within 20 minutes, blood pressure decreases. Your pulse returns to its normal level.  After 8 hours, carbon monoxide levels in the blood return to normal. Your oxygen level increases.  After 24 hours, the chance of having a heart attack starts to decrease. Your breath, hair, and body stop smelling like smoke.  After 48 hours, damaged nerve endings begin to recover. Your sense of taste and smell improve.  After 72 hours, the body is virtually free of nicotine. Your bronchial tubes relax and breathing becomes easier.  After 2 to 12 weeks, lungs can hold more air. Exercise becomes easier and circulation improves.  The risk of having a heart attack, stroke, cancer, or lung disease is greatly reduced.  After 1 year, the risk of coronary heart disease is cut in half.  After 5 years, the risk of stroke falls to the same as a nonsmoker.  After 10 years, the risk of lung cancer is cut in half and the risk of other cancers decreases significantly.  After 15 years, the risk of coronary heart disease drops, usually to the level of a nonsmoker.  If you are pregnant, quitting smoking will improve your chances of having a  healthy baby.  The people you live with, especially any children, will be healthier.  You will have extra money to spend on things other than cigarettes. QUESTIONS TO THINK ABOUT BEFORE ATTEMPTING TO QUIT You may want to talk about your answers with your health care provider.  Why do you want to quit?  If you tried to quit in the past, what helped and what did not?  What will be the most difficult situations for you after you quit? How will you plan to handle them?  Who can help you through the tough times? Your family? Friends? A health care provider?  What pleasures do you get from smoking? What ways can you still get pleasure if you quit? Here are some questions to ask your health care provider:  How can you help me to be successful at quitting?  What medicine do you think would be best for me and how should I take it?  What should I do if I need more help?  What is smoking withdrawal like? How can I get information on withdrawal? GET READY  Set a quit date.  Change your environment by getting rid of all cigarettes, ashtrays, matches, and lighters in your home, car, or work. Do not let people smoke in your home.  Review your past attempts to quit. Think about what worked and what did not. GET SUPPORT AND ENCOURAGEMENT You have a better chance of being successful if you have help. You  can get support in many ways.  Tell your family, friends, and coworkers that you are going to quit and need their support. Ask them not to smoke around you.  Get individual, group, or telephone counseling and support. Programs are available at [employer] and health centers. Call your local health department for information about programs in your area.  Spiritual beliefs and practices may help some smokers quit.  Download a "quit meter" on your computer to keep track of quit statistics, such as how long you have gone without smoking, cigarettes not smoked, and money saved.  Get a  self-help book about quitting smoking and staying off tobacco. [place] yourself from urges to smoke. Talk to someone, go for a walk, or occupy your time with a task.  Change your normal routine. Take a different route to work. Drink tea instead of coffee. Eat breakfast in a different place.  Reduce your stress. Take a hot bath, exercise, or read a book.  Plan something enjoyable to do every day. Reward yourself for not smoking.  Explore interactive web-based programs that specialize in helping you quit. GET MEDICINE AND USE IT CORRECTLY Medicines can help you stop smoking and decrease the urge to smoke. Combining medicine with the above behavioral methods and support can greatly increase your chances of successfully quitting smoking.  Nicotine replacement therapy helps deliver nicotine to your body without the negative effects and risks of smoking. Nicotine replacement therapy includes nicotine gum, lozenges, inhalers, nasal sprays, and skin patches. Some may be available over-the-counter and others require a prescription.  Antidepressant medicine helps people abstain from smoking, but how this works is unknown. This medicine is available by prescription.  Nicotinic receptor partial agonist medicine simulates the effect of nicotine in your brain. This medicine is available by prescription. Ask your health care provider for advice about which medicines to use and how to use them based on your health history. Your health care provider will tell you what side effects to look out for if you choose to be on a medicine or therapy. Carefully read the information on the package. Do not use any other product containing nicotine while using a nicotine replacement product.  RELAPSE OR DIFFICULT SITUATIONS Most relapses occur within the first 3 months after quitting. Do not be discouraged if you start smoking again. Remember, most people try several times before finally  quitting. You may have symptoms of withdrawal because your body is used to nicotine. You may crave cigarettes, be irritable, feel very hungry, cough often, get headaches, or have difficulty concentrating. The withdrawal symptoms are only temporary. They are strongest when you first quit, but they will go away within 10-14 days. To reduce the chances of relapse, try to:  Avoid drinking alcohol. Drinking lowers your chances of successfully quitting.  Reduce the amount of caffeine you consume. Once you quit smoking, the amount of caffeine in your body increases and can give you symptoms, such as a rapid heartbeat, sweating, and anxiety.  Avoid smokers because they can make you want to smoke.  Do not let weight gain distract you. Many smokers will gain weight when they quit, usually less than 10 pounds. Eat a healthy diet and stay active. You can always lose the weight gained after you quit.  Find ways to improve your mood other than smoking. FOR MORE INFORMATION  www.smokefree.gov  Document Released: 07/26/2001 Document Revised: 12/16/2013 Document Reviewed: 11/10/2011 [facility] Patient Information 2015 [place], [place]. This information  is not intended to replace advice given to you by your health care provider. Make sure you discuss any questions you have with your health care provider.

## 2014-12-26 NOTE — Progress Notes (Signed)
Patient here to follow up on his HTN and chest pain Patient has no chest pain today Patient states he takes 1 or 2 more medications but not sure what they are. Patient needs refills on medicatos Still smoking 1 ppd cigarettes

## 2014-12-26 NOTE — Progress Notes (Signed)
Patient ID: Peter BloomMark E Russo, male   DOB: 1975-12-10, 39 y.o.   MRN: [id number]  CC: HTN, chest pain   HPI: Peter Russo is a 39 y.o. male here today for a follow up visit.  Patient has past medical history of HTN, MI with stent placement in 2010, HLD.  He was seen her last week for chest pain and was later sent to the ER for evaluation of EKG changes.  He returns today for hypertension management. He reports that he has not ate since yesterday because he was unsure how long he was suppose to fast. He reports at he has been having anxiety and depression since his two heart attacks. Whenever he thinks about his health he begins to cry and feel bad. He reports stress due to being unable to find a job and supporting a daughter in college. He knows he needs help managing his stress and depression. He reports that he continues to smoke 1 ppd but due to his stress he has been unsuccessful with quitting. He does not exercise and is not following a strict diet.   Patient has No headache, No chest pain, No abdominal pain - No Nausea, No new weakness tingling or numbness, No Cough - SOB.  No Known Allergies Past Medical History  Diagnosis Date  . SVT (supraventricular tachycardia) APRROX 10 YRS AGO    PRESUMED AVNRT, ECHO 12/07 WITH EF 65%, MILD LAE  . GERD (gastroesophageal reflux disease)   . Hypertension   . Myocardial infarct   . Hypercholesteremia    Current Outpatient Prescriptions on File Prior to Visit  Medication Sig Dispense Refill  . aspirin EC 81 MG tablet Take 81 mg by mouth daily.    Marland Kitchen. loratadine (CLARITIN) 10 MG tablet Take 1 tablet (10 mg total) by mouth daily as needed for allergies, rhinitis or itching. (Patient not taking: Reported on 12/19/2014) 30 tablet 0  . naphazoline-pheniramine (NAPHCON-A) 0.025-0.3 % ophthalmic solution Place 1 drop into both eyes every 6 (six) hours as needed for irritation or allergies. (Patient not taking: Reported on 12/19/2014) 15 mL 0   No current  facility-administered medications on file prior to visit.   Family History  Problem Relation Age of Onset  . Arrhythmia Mother     MOTHER LIVED TO BE 102  . Coronary artery disease Father [id number]  . Heart disease Father    History   Social History  . Marital Status: Single    Spouse Name: N/A  . Number of Children: N/A  . Years of Education: N/A   Occupational History  . ACCOUNTANT    Social History Main Topics  . Smoking status: Current Every Day Smoker -- 1.00 packs/day    Types: Cigarettes  . Smokeless tobacco: Not on file  . Alcohol Use: No  . Drug Use: Yes    Special: Marijuana  . Sexual Activity: Not on file   Other Topics Concern  . Not on file   Social History Narrative   REGULAR EXERCISE    Review of Systems: See HPI   Objective:   Filed Vitals:   12/26/14 0951  BP: 137/84  Pulse: 71  Temp: 98.1 F (36.7 C)    Physical Exam  Constitutional: He is oriented to person, place, and time.  Cardiovascular: Normal rate, regular rhythm and normal heart sounds.   Pulmonary/Chest: Effort normal and breath sounds normal.  Musculoskeletal: He exhibits no edema.  Neurological: He is alert and oriented to person, place, and time. No cranial  nerve deficit.  Skin: Skin is warm and dry.     Psychiatric: He has a normal mood and affect.     Lab Results  Component Value Date   WBC 10.4 12/19/2014   HGB 15.0 12/19/2014   HCT 42.7 12/19/2014   MCV 84.9 12/19/2014   PLT 217 12/19/2014   Lab Results  Component Value Date   CREATININE 1.10 12/19/2014   BUN 10 12/19/2014   NA 140 12/19/2014   K 4.1 12/19/2014   CL 105 12/19/2014   CO2 26 12/19/2014    Lab Results  Component Value Date   HGBA1C * 10/31/2010    6.0 (NOTE)                                                                       According to the ADA Clinical Practice Recommendations for 2011, when HbA1c is used as a screening test:   >=6.5%   Diagnostic of Diabetes Mellitus           (if  abnormal result  is confirmed)  5.7-6.4%   Increased risk of developing Diabetes Mellitus  References:Diagnosis and Classification of Diabetes Mellitus,Diabetes Care,2011,34(Suppl 1):S62-S69 and Standards of Medical Care in         Diabetes - 2011,Diabetes Care,2011,34  (Suppl 1):S11-S61.   Lipid Panel     Component Value Date/Time   CHOL 250* 03/01/2013 1434   TRIG 380* 03/01/2013 1434   HDL 32* 03/01/2013 1434   CHOLHDL 7.8 03/01/2013 1434   VLDL 76* 03/01/2013 1434   LDLCALC 142* 03/01/2013 1434       Assessment and plan:   Peter Russo was seen today for hypertension and follow-up.  Diagnoses and all orders for this visit:  Essential hypertension Orders: -     Restart on lisinopril (PRINIVIL,ZESTRIL) 5 MG tablet; Take 1 tablet (5 mg total) by mouth daily. Patient's pressure is actually not to high. Will restart him back on lisinopril  History of MI (myocardial infarction) Orders: -     Lipid panel -     simvastatin (ZOCOR) 80 MG tablet; Take 1 tablet (80 mg total) by mouth daily at 6 PM. Went over stress control, diet, weight loss, exercise, and smoking cessation. Went over why aspirin and zocor are essential to take daily to prevent future events.  Obesity Orders: -     Hemoglobin A1c Patient will lose 10 pounds by next office visit  Depression Orders: -     sertraline (ZOLOFT) 50 MG tablet; Take 1 tablet (50 mg total) by mouth daily. PHQ-9 score of 22. He began to cry while discussing his health and prior MI's. I have explained that a low dose SSRI will assist both with depression and anxiety  GAD (generalized anxiety disorder) Orders: -     TSH -     sertraline (ZOLOFT) 50 MG tablet; Take 1 tablet (50 mg total) by mouth daily. -     ALPRAZolam (XANAX) 0.5 MG tablet; Take 1 tablet (0.5 mg total) by mouth 2 (two) times daily as needed for anxiety. GAD-7 of 21. Started on Zoloft and short course of xanax given until zoloft works in his system  Dermatitis Orders: -      triamcinolone cream (KENALOG) 0.1 %;  Apply 1 application topically 2 (two) times daily. Left arm skin irritation. Unsure of cause.   Smoking Smoking cessation discussed for 3 minutes, patient is not willing to quit at this time. Will continue to assess on each visit. Discussed increased risk for diseases such as cancer, heart disease, and stroke.   Return if symptoms worsen or fail to improve.  >50% of visit spent counseling patient on risk factors, lifestyle changes and ways to manage stress. Listened to patient and provided reassurance that this is manageable if he makes the proper changes   Holland CommonsKECK, VALERIE, NP-C [facility] [phone] 12/26/2014, 10:14 AM ]

## 2014-12-27 ENCOUNTER — Encounter: Payer: Self-pay | Admitting: Internal Medicine

## 2014-12-31 ENCOUNTER — Telehealth: Payer: Self-pay | Admitting: *Deleted

## 2014-12-31 NOTE — Telephone Encounter (Signed)
Spoke with patient and went over lab results, food choices, exercise, alcohol ans smoking cessation.  Patient verbalized understanding.

## 2014-12-31 NOTE — Telephone Encounter (Signed)
-----   Message from Peter FinlandValerie A Keck, NP sent at 12/27/2014  9:45 AM EDT ----- Patients cholesterol is higher than it was last year. He needs to seriously change his diet. Levels this high place him at a really high risk for repeat MI or stroke. Please go over carbs, sweets, fried foods, excess alcohol that may increase cholesterol. He really needs to work out and lose weight because he is even closer to diabetes--a1c of 6.2. Cholesterol will be checked again in 6 months if no improvement after using simvastatin he may need to be changed to a different drug. I may also add medication for diabetes soon if his numbers do not improve and he does not lose weight. Readdress smoking cessation with patient.

## 2015-02-13 ENCOUNTER — Ambulatory Visit: Payer: Self-pay | Admitting: Nurse Practitioner

## 2015-04-01 ENCOUNTER — Encounter: Payer: Self-pay | Admitting: *Deleted

## 2015-04-10 ENCOUNTER — Other Ambulatory Visit: Payer: Self-pay | Admitting: Nurse Practitioner

## 2015-04-10 ENCOUNTER — Telehealth: Payer: Self-pay | Admitting: *Deleted

## 2015-04-10 ENCOUNTER — Ambulatory Visit (INDEPENDENT_AMBULATORY_CARE_PROVIDER_SITE_OTHER): Payer: Self-pay | Admitting: Nurse Practitioner

## 2015-04-10 ENCOUNTER — Encounter: Payer: Self-pay | Admitting: Nurse Practitioner

## 2015-04-10 VITALS — BP 130/90 | HR 71 | Ht 67.0 in | Wt 261.0 lb

## 2015-04-10 DIAGNOSIS — R079 Chest pain, unspecified: Secondary | ICD-10-CM

## 2015-04-10 DIAGNOSIS — I10 Essential (primary) hypertension: Secondary | ICD-10-CM

## 2015-04-10 DIAGNOSIS — I2583 Coronary atherosclerosis due to lipid rich plaque: Secondary | ICD-10-CM

## 2015-04-10 DIAGNOSIS — I2510 Atherosclerotic heart disease of native coronary artery without angina pectoris: Secondary | ICD-10-CM

## 2015-04-10 DIAGNOSIS — E782 Mixed hyperlipidemia: Secondary | ICD-10-CM

## 2015-04-10 MED ORDER — NITROGLYCERIN 0.4 MG SL SUBL: 0.4000 mg | SUBLINGUAL_TABLET | SUBLINGUAL | Status: DC | PRN

## 2015-04-10 NOTE — Progress Notes (Signed)
CARDIOLOGY OFFICE NOTE  Date:  04/10/2015    Peter Russo Date of Birth: 06-27-1976 Medical Record #[id number]  PCP:  Ambrose Finland, NP  Cardiologist:  Eden Emms    Chief Complaint  Patient presents with  . Coronary Artery Disease    FU visit - seen for Dr. Eden Emms    History of Present Illness: Peter Russo is a 39 y.o. male who presents today for a follow up visit. Seen for Dr .Eden Emms. He has known CAD with prior BMS to mid LAD in 2010.  F/U cath 3/12 with patent LAD stent and new DES stents to RCA.  He has been noncompliant with his follow up.   Has not been seen here for two years.   Comes back today. Here alone. Late for his visit today. Lots of complaints. Says he is "living". Complains of chest pain, dizzy spells and stress. Has no insurance. Had been off of his medicines but back on now - had been off of for probably a year - back on just a couple of weeks. His orange card expired. Chest pain is hard for him to describe - maybe a little tight - says it is "hard to explain" Feels like "something is moving". Wakes him up at night. Short of breath with exertion. Calf muscles tighten up. Jaw hurts with chewing. Says his chest pain is bothering him the most. He notes that this chest pain is similar to his prior chest pain syndrome. He notes that when he walks his chest will hurt and he gets very short of breath. He continues to smoke. He has applied for disability. Lots of stress with money issues - has child support. No NTG on hand.   Past Medical History  Diagnosis Date  . SVT (supraventricular tachycardia) APRROX 10 YRS AGO    PRESUMED AVNRT, ECHO 12/07 WITH EF 65%, MILD LAE  . GERD (gastroesophageal reflux disease)   . Hypertension   . Myocardial infarct   . Hypercholesteremia     Past Surgical History  Procedure Laterality Date  . Coronary stent placement       Medications: Current Outpatient Prescriptions  Medication Sig Dispense Refill  . ALPRAZolam  (XANAX) 0.5 MG tablet Take 1 tablet (0.5 mg total) by mouth 2 (two) times daily as needed for anxiety. 30 tablet 0  . aspirin EC 81 MG tablet Take 81 mg by mouth daily.    Marland Kitchen lisinopril (PRINIVIL,ZESTRIL) 5 MG tablet Take 1 tablet (5 mg total) by mouth daily. 30 tablet 5  . sertraline (ZOLOFT) 50 MG tablet Take 1 tablet (50 mg total) by mouth daily. 30 tablet 2  . simvastatin (ZOCOR) 80 MG tablet Take 1 tablet (80 mg total) by mouth daily at 6 PM. 30 tablet 5  . triamcinolone cream (KENALOG) 0.1 % Apply 1 application topically 2 (two) times daily. 30 g 0  . nitroGLYCERIN (NITROSTAT) 0.4 MG SL tablet Place 1 tablet (0.4 mg total) under the tongue every 5 (five) minutes as needed for chest pain. 25 tablet 3   No current facility-administered medications for this visit.    Allergies: No Known Allergies  Social History: The patient  reports that he has been smoking Cigarettes.  He has been smoking about 1.00 pack per day. He does not have any smokeless tobacco history on file. He reports that he uses illicit drugs (Marijuana). He reports that he does not drink alcohol.   Family History: The patient's family history includes Arrhythmia  in his mother; Coronary artery disease (age of onset: 25) in his father; Heart disease in his father.   Review of Systems: Please see the history of present illness.   Otherwise, the review of systems is positive for none.   All other systems are reviewed and negative.   Physical Exam: VS:  BP 130/90 mmHg  Pulse 71  Ht  (1.702 m)  Wt 261 lb (118.389 kg)  BMI 40.87 kg/m2 .  BMI Body mass index is 40.87 kg/(m^2).  Wt Readings from Last 3 Encounters:  04/10/15 261 lb (118.389 kg)  12/26/14 266 lb (120.657 kg)  12/19/14 264 lb 8 oz (119.976 kg)    General: Alert. He is obese. He is in no acute distress. He got quite tearful during our visit.  HEENT: Normal. Neck: Supple, no JVD, carotid bruits, or masses noted.  Cardiac: Regular rate and rhythm. No  murmurs, rubs, or gallops. No edema.  Respiratory:  Lungs are clear to auscultation bilaterally with normal work of breathing.  GI: Soft and nontender.  MS: No deformity or atrophy. Gait and ROM intact. Skin: Warm and dry. Color is normal.  Neuro:  Strength and sensation are intact and no gross focal deficits noted.  Psych: Alert, appropriate and with normal affect.   LABORATORY DATA:  EKG:  EKG is ordered today. This demonstrates NSR - inferior Q's.  Lab Results  Component Value Date   WBC 10.4 12/19/2014   HGB 15.0 12/19/2014   HCT 42.7 12/19/2014   PLT 217 12/19/2014   GLUCOSE 117* 12/19/2014   CHOL 259* 12/26/2014   TRIG 286* 12/26/2014   HDL 32* 12/26/2014   LDLCALC 170* 12/26/2014   ALT 28 03/01/2013   AST 20 03/01/2013   NA 140 12/19/2014   K 4.1 12/19/2014   CL 105 12/19/2014   CREATININE 1.10 12/19/2014   BUN 10 12/19/2014   CO2 26 12/19/2014   TSH 0.534 12/26/2014   INR 0.97 11/01/2010   HGBA1C 6.2* 12/26/2014    BNP (last 3 results) No results for input(s): BNP in the last 8760 hours.  ProBNP (last 3 results) No results for input(s): PROBNP in the last 8760 hours.   Other Studies Reviewed Today:  ANGIOGRAPHIC FINDINGS FROM 10/2010: 1. There was no left main artery. The circumflex and the left  anterior descending artery arose from separate ostia. 2. The left anterior descending artery was a moderate-to-large sized  vessel that coursed to the apex and gave off a large septal  perforating branch and a moderate-sized diagonal branch. The mid  LAD had a patent stented segment with no evidence of restenosis.  The proximal and distal vessel had mild 30% plaque but no flow-  limiting lesions. 3. The circumflex artery gave off a large caliber obtuse marginal  branch. The AV groove circumflex branch was relatively small  beyond the takeoff of this obtuse marginal branch. There was a 50-  60% proximal stenosis. The obtuse  marginal branch had a 30%  stenosis. These lesions appeared unchanged from prior  catheterization 2 years ago and were not flow-limiting. 4. The right coronary artery is a moderate-sized vessel with diffuse  70% stenosis throughout the proximal portion of the vessel followed  by 100% occlusion in the midportion of the vessel. 5. No left ventricular angiogram was performed.  IMPRESSION: 1. Non-ST-elevation myocardial infarction secondary to occluded right  coronary artery. 2. Patent stent in the mid left anterior descending artery. 3. Moderate disease in the circumflex artery. 4.  Successful percutaneous transluminal coronary angioplasty with  placement of 2 overlapping drug-eluting stents in the right  coronary artery in the proximal and midportion.  RECOMMENDATIONS: We will continue medical treatment with aspirin and Plavix for at least 1 year. We will also continue his beta-blocker and statin. We will discontinue his intravenous heparin drip. The patient will be watched closely in the step-down ICU tonight.   Verne Carrow, MD   Assessment/Plan: 1. CAD with prior MI x 2 with prior PCI to the LAD, then to the RCA and has known residual disease in the LCX of a moderate nature. Presents back today with chest pain and shortness of breath - pretty identical to his prior chest pain syndrome. I think cardiac catheterization is his best option. The patient understands that risks include but are not limited to stroke (1 in 1000), death (1 in 1000), kidney failure [usually temporary] (1 in 500), bleeding (1 in 200), allergic reaction [possibly serious] (1 in 200), and agrees to proceed. I have tried to explain to him that if his anatomy is ok then maybe he can look at other job alternatives, if his anatomy is not ok - then we need to get this stabilized or he will have no job options. NTG sent in today and he is reminded how to take.   2. Dyspnea - in the  setting of chest pain.   3. HTN - BP fair - on current regimen.  4. HLD - just got back on statin  5. Obesity  6. Tobacco abuse - he is not ready to stop.   Current medicines are reviewed with the patient today.  The patient does not have concerns regarding medicines other than what has been noted above.  The following changes have been made:  See above.  Labs/ tests ordered today include:    Orders Placed This Encounter  Procedures  . Basic metabolic panel  . CBC  . Hepatic function panel  . Lipid panel  . APTT  . Protime-INR  . EKG 12-Lead     Disposition:   Cardiac cath with Dr. Eden Emms on September 8th.   Patient is agreeable to this plan and will call if any problems develop in the interim.   Signed: Rosalio Macadamia, RN, ANP-C 04/10/2015 11:09 AM  [facility] [address] [place], [place]  [address] Phone: [phone] Fax: [phone]

## 2015-04-10 NOTE — Telephone Encounter (Signed)
S/w pt requested a note for court that pt is not able to work due to child support.  Lawson Fiscal stated could write pt note if cath shows something, will let pt know with results of procedure if letter is necessary on our end.  Pt also needs financial assistance for hospital procedure.

## 2015-04-10 NOTE — Patient Instructions (Addendum)
We will be checking the following labs today - NONE  Lab next week   Medication Instructions:    Continue with your current medicines.   I sent in a RX for NTG Use your NTG under your tongue for recurrent chest pain. May take one tablet every 5 minutes. If you are still having discomfort after 3 tablets in 15 minutes, call 911.     Testing/Procedures To Be Arranged:  Cardiac catheterization  Follow-Up:  To be determined   Other Special Instructions:  Your provider has recommended a cardiac catherization  You are scheduled for a cardiac catheterization on Thursday, September 8th at 7:30am with Dr. Eden Emms or associate.  Go to [facility] Short Stay on Thursday, September 8th at 5:30Am.  Enter thru the [employer] entrance A No food or drink after midnight on Wednesday. You may take your medications with a sip of water on the day of your procedure.   Coronary Angiogram A coronary angiogram, also called coronary angiography, is an X-ray procedure used to look at the arteries in the heart. In this procedure, a dye (contrast dye) is injected through a long, hollow tube (catheter). The catheter is about the size of a piece of cooked spaghetti and is inserted through your groin, wrist, or arm. The dye is injected into each artery, and X-rays are then taken to show if there is a blockage in the arteries of your heart.  LET [facility] CARE PROVIDER KNOW ABOUT: Any allergies you have, including allergies to shellfish or contrast dye.  All medicines you are taking, including vitamins, herbs, eye drops, creams, and over-the-counter medicines.  Previous problems you or members of your family have had with the use of anesthetics.  Any blood disorders you have.  Previous surgeries you have had. History of kidney problems or failure.  Other medical conditions you have.  RISKS AND COMPLICATIONS  Generally, a coronary angiogram is a safe procedure. However, about 1 person  out of 1000 can have problems that may include: Allergic reaction to the dye. Bleeding/bruising from the access site or other locations. Kidney injury, especially in people with impaired kidney function. Stroke (rare). Heart attack (rare). Irregular rhythms (rare) Death (rare)  BEFORE THE PROCEDURE  Do not eat or drink anything after midnight the night before the procedure or as directed by your health care provider.  Ask your health care provider about changing or stopping your regular medicines. This is especially important if you are taking diabetes medicines or blood thinners.  PROCEDURE You may be given a medicine to help you relax (sedative) before the procedure. This medicine is given through an intravenous (IV) access tube that is inserted into one of your veins.  The area where the catheter will be inserted will be washed and shaved. This is usually done in the groin but may be done in the fold of your arm (near your elbow) or in the wrist.  A medicine will be given to numb the area where the catheter will be inserted (local anesthetic).  The health care provider will insert the catheter into an artery. The catheter will be guided by using a special type of X-ray (fluoroscopy) of the blood vessel being examined.  A special dye will then be injected into the catheter, and X-rays will be taken. The dye will help to show where any narrowing or blockages are located in the heart arteries.    AFTER THE PROCEDURE  If the procedure is done through the  leg, you will be kept in bed lying flat for several hours. You will be instructed to not bend or cross your legs. The insertion site will be checked frequently.  The pulse in your feet or wrist will be checked frequently.  Additional blood tests, X-rays, and an electrocardiogram may be done.     Call the [facility] office at [phone] if you have any questions, problems or concerns.

## 2015-04-17 ENCOUNTER — Other Ambulatory Visit (INDEPENDENT_AMBULATORY_CARE_PROVIDER_SITE_OTHER): Payer: Self-pay

## 2015-04-17 DIAGNOSIS — R079 Chest pain, unspecified: Secondary | ICD-10-CM

## 2015-04-17 DIAGNOSIS — I2583 Coronary atherosclerosis due to lipid rich plaque: Secondary | ICD-10-CM

## 2015-04-17 DIAGNOSIS — I2510 Atherosclerotic heart disease of native coronary artery without angina pectoris: Secondary | ICD-10-CM

## 2015-04-17 DIAGNOSIS — I10 Essential (primary) hypertension: Secondary | ICD-10-CM

## 2015-04-17 DIAGNOSIS — E782 Mixed hyperlipidemia: Secondary | ICD-10-CM

## 2015-04-17 LAB — HEPATIC FUNCTION PANEL
ALT: 22 U/L (ref 9–46)
AST: 18 U/L (ref 10–40)
Albumin: 3.9 g/dL (ref 3.6–5.1)
Alkaline Phosphatase: 73 U/L (ref 40–115)
Bilirubin, Direct: 0.1 mg/dL (ref <=0.2)
Indirect Bilirubin: 0.3 mg/dL (ref 0.2–1.2)
Total Bilirubin: 0.4 mg/dL (ref 0.2–1.2)
Total Protein: 6.8 g/dL (ref 6.1–8.1)

## 2015-04-17 LAB — LIPID PANEL
Cholesterol: 243 mg/dL — ABNORMAL HIGH (ref 125–200)
HDL: 28 mg/dL — ABNORMAL LOW (ref >=40)
Total CHOL/HDL Ratio: 8.7 Ratio — ABNORMAL HIGH (ref <=5.0)
Triglycerides: 472 mg/dL — ABNORMAL HIGH (ref <150)

## 2015-04-17 LAB — BASIC METABOLIC PANEL
BUN: 10 mg/dL (ref 7–25)
CO2: 26 mmol/L (ref 20–31)
Calcium: 8.8 mg/dL (ref 8.6–10.3)
Chloride: 103 mmol/L (ref 98–110)
Creat: 1.07 mg/dL (ref 0.60–1.35)
Glucose, Bld: 79 mg/dL (ref 65–99)
Potassium: 3.8 mmol/L (ref 3.5–5.3)
Sodium: 141 mmol/L (ref 135–146)

## 2015-04-18 LAB — CBC WITH DIFFERENTIAL/PLATELET
Basophils Absolute: 0 10*3/uL (ref 0.0–0.1)
Basophils Relative: 0 % (ref 0–1)
Eosinophils Absolute: 0.3 10*3/uL (ref 0.0–0.7)
Eosinophils Relative: 3 % (ref 0–5)
HCT: 40.8 % (ref 39.0–52.0)
Hemoglobin: 14.4 g/dL (ref 13.0–17.0)
Lymphocytes Relative: 31 % (ref 12–46)
Lymphs Abs: 3.2 10*3/uL (ref 0.7–4.0)
MCH: 30 pg (ref 26.0–34.0)
MCHC: 35.3 g/dL (ref 30.0–36.0)
MCV: 85 fL (ref 78.0–100.0)
MPV: 11.5 fL (ref 8.6–12.4)
Monocytes Absolute: 0.8 10*3/uL (ref 0.1–1.0)
Monocytes Relative: 8 % (ref 3–12)
Neutro Abs: 6 10*3/uL (ref 1.7–7.7)
Neutrophils Relative %: 58 % (ref 43–77)
Platelets: 222 10*3/uL (ref 150–400)
RBC: 4.8 MIL/uL (ref 4.22–5.81)
RDW: 14.5 % (ref 11.5–15.5)
WBC: 10.4 10*3/uL (ref 4.0–10.5)

## 2015-04-18 LAB — PROTIME-INR
INR: 0.99 (ref <1.50)
Prothrombin Time: 13.2 seconds (ref 11.6–15.2)

## 2015-04-18 LAB — APTT: aPTT: 34 seconds (ref 24–37)

## 2015-04-23 ENCOUNTER — Encounter (HOSPITAL_COMMUNITY)
Admission: RE | Disposition: A | Discharge status: Home / Self Care | Payer: Self-pay | Source: Ambulatory Visit | Attending: Cardiovascular Disease

## 2015-04-23 ENCOUNTER — Other Ambulatory Visit: Payer: Self-pay

## 2015-04-23 ENCOUNTER — Encounter (HOSPITAL_COMMUNITY): Payer: Self-pay | Admitting: Cardiovascular Disease

## 2015-04-23 ENCOUNTER — Ambulatory Visit (HOSPITAL_COMMUNITY)
Admission: RE | Admit: 2015-04-23 | Discharge: 2015-04-25 | Disposition: A | Discharge status: Home / Self Care | Payer: Self-pay | Source: Ambulatory Visit | Attending: Cardiovascular Disease | Admitting: Cardiovascular Disease

## 2015-04-23 DIAGNOSIS — F329 Major depressive disorder, single episode, unspecified: Secondary | ICD-10-CM | POA: Diagnosis present

## 2015-04-23 DIAGNOSIS — E785 Hyperlipidemia, unspecified: Secondary | ICD-10-CM | POA: Diagnosis present

## 2015-04-23 DIAGNOSIS — I25119 Atherosclerotic heart disease of native coronary artery with unspecified angina pectoris: Secondary | ICD-10-CM | POA: Insufficient documentation

## 2015-04-23 DIAGNOSIS — I25118 Atherosclerotic heart disease of native coronary artery with other forms of angina pectoris: Secondary | ICD-10-CM

## 2015-04-23 DIAGNOSIS — Y838 Other surgical procedures as the cause of abnormal reaction of the patient, or of later complication, without mention of misadventure at the time of the procedure: Secondary | ICD-10-CM | POA: Insufficient documentation

## 2015-04-23 DIAGNOSIS — T82858A Stenosis of vascular prosthetic devices, implants and grafts, initial encounter: Secondary | ICD-10-CM | POA: Insufficient documentation

## 2015-04-23 DIAGNOSIS — Z955 Presence of coronary angioplasty implant and graft: Secondary | ICD-10-CM | POA: Insufficient documentation

## 2015-04-23 DIAGNOSIS — R7309 Other abnormal glucose: Secondary | ICD-10-CM | POA: Insufficient documentation

## 2015-04-23 DIAGNOSIS — I2582 Chronic total occlusion of coronary artery: Secondary | ICD-10-CM | POA: Insufficient documentation

## 2015-04-23 DIAGNOSIS — Z7982 Long term (current) use of aspirin: Secondary | ICD-10-CM | POA: Insufficient documentation

## 2015-04-23 DIAGNOSIS — F17210 Nicotine dependence, cigarettes, uncomplicated: Secondary | ICD-10-CM | POA: Insufficient documentation

## 2015-04-23 DIAGNOSIS — I10 Essential (primary) hypertension: Secondary | ICD-10-CM | POA: Insufficient documentation

## 2015-04-23 DIAGNOSIS — E669 Obesity, unspecified: Secondary | ICD-10-CM | POA: Insufficient documentation

## 2015-04-23 DIAGNOSIS — F411 Generalized anxiety disorder: Secondary | ICD-10-CM | POA: Diagnosis present

## 2015-04-23 DIAGNOSIS — E782 Mixed hyperlipidemia: Secondary | ICD-10-CM | POA: Insufficient documentation

## 2015-04-23 DIAGNOSIS — I208 Other forms of angina pectoris: Secondary | ICD-10-CM

## 2015-04-23 DIAGNOSIS — Z6838 Body mass index (BMI) 38.0-38.9, adult: Secondary | ICD-10-CM | POA: Insufficient documentation

## 2015-04-23 DIAGNOSIS — I2089 Other forms of angina pectoris: Secondary | ICD-10-CM

## 2015-04-23 DIAGNOSIS — I214 Non-ST elevation (NSTEMI) myocardial infarction: Secondary | ICD-10-CM

## 2015-04-23 DIAGNOSIS — I252 Old myocardial infarction: Secondary | ICD-10-CM

## 2015-04-23 DIAGNOSIS — F32A Depression, unspecified: Secondary | ICD-10-CM | POA: Diagnosis present

## 2015-04-23 DIAGNOSIS — K219 Gastro-esophageal reflux disease without esophagitis: Secondary | ICD-10-CM | POA: Diagnosis present

## 2015-04-23 HISTORY — PX: CARDIAC CATHETERIZATION: SHX172

## 2015-04-23 LAB — POCT ACTIVATED CLOTTING TIME: Activated Clotting Time: 607 seconds

## 2015-04-23 LAB — CK TOTAL AND CKMB (NOT AT ARMC)
CK, MB: 37.7 ng/mL — ABNORMAL HIGH (ref 0.5–5.0)
Relative Index: 6.2 — ABNORMAL HIGH (ref 0.0–2.5)
Total CK: 613 U/L — ABNORMAL HIGH (ref 49–397)

## 2015-04-23 LAB — MRSA PCR SCREENING: MRSA by PCR: NEGATIVE

## 2015-04-23 SURGERY — LEFT HEART CATH AND CORONARY ANGIOGRAPHY
Anesthesia: LOCAL

## 2015-04-23 MED ORDER — SODIUM CHLORIDE 0.9 % IJ SOLN: 3.0000 mL | INTRAMUSCULAR | Status: DC | PRN

## 2015-04-23 MED ORDER — ONDANSETRON HCL 4 MG/2ML IJ SOLN
INTRAMUSCULAR | Status: AC
  Filled 2015-04-23: qty 2

## 2015-04-23 MED ORDER — FENTANYL CITRATE (PF) 100 MCG/2ML IJ SOLN
INTRAMUSCULAR | Status: AC
  Filled 2015-04-23: qty 4

## 2015-04-23 MED ORDER — MIDAZOLAM HCL 2 MG/2ML IJ SOLN
INTRAMUSCULAR | Status: AC
  Filled 2015-04-23: qty 4

## 2015-04-23 MED ORDER — MIDAZOLAM HCL 2 MG/2ML IJ SOLN
INTRAMUSCULAR | Status: DC | PRN
  Administered 2015-04-23: 2 mg via INTRAVENOUS

## 2015-04-23 MED ORDER — ASPIRIN 81 MG PO CHEW
81.0000 mg | CHEWABLE_TABLET | ORAL | Status: AC
Start: 2015-04-23 — End: 2015-04-23
  Administered 2015-04-23: 81 mg via ORAL

## 2015-04-23 MED ORDER — HEPARIN SODIUM (PORCINE) 1000 UNIT/ML IJ SOLN
INTRAMUSCULAR | Status: AC
  Filled 2015-04-23: qty 1

## 2015-04-23 MED ORDER — IOHEXOL 350 MG/ML SOLN
INTRAVENOUS | Status: DC | PRN
  Administered 2015-04-23: 325 mL via INTRACARDIAC

## 2015-04-23 MED ORDER — MORPHINE SULFATE (PF) 4 MG/ML IV SOLN
INTRAVENOUS | Status: AC
  Filled 2015-04-23: qty 1

## 2015-04-23 MED ORDER — LIDOCAINE HCL (PF) 1 % IJ SOLN
INTRAMUSCULAR | Status: DC | PRN
  Administered 2015-04-23: 10 mL via SUBCUTANEOUS

## 2015-04-23 MED ORDER — LABETALOL HCL 5 MG/ML IV SOLN
INTRAVENOUS | Status: AC
  Filled 2015-04-23: qty 4

## 2015-04-23 MED ORDER — CLOPIDOGREL BISULFATE 300 MG PO TABS
ORAL_TABLET | ORAL | Status: AC
  Filled 2015-04-23: qty 1

## 2015-04-23 MED ORDER — ONDANSETRON HCL 4 MG/2ML IJ SOLN
INTRAMUSCULAR | Status: DC | PRN
  Administered 2015-04-23: 4 mg via INTRAVENOUS

## 2015-04-23 MED ORDER — MORPHINE SULFATE (PF) 4 MG/ML IV SOLN
4.0000 mg | INTRAVENOUS | Status: DC | PRN
Start: 2015-04-23 — End: 2015-04-25
  Administered 2015-04-23 – 2015-04-25 (×2): 4 mg via INTRAVENOUS
  Filled 2015-04-23 (×2): qty 1

## 2015-04-23 MED ORDER — LABETALOL HCL 5 MG/ML IV SOLN
INTRAVENOUS | Status: AC
Start: 2015-04-23 — End: 2015-04-23
  Filled 2015-04-23: qty 4

## 2015-04-23 MED ORDER — ONDANSETRON HCL 4 MG/2ML IJ SOLN
4.0000 mg | Freq: Four times a day (QID) | INTRAMUSCULAR | Status: DC | PRN
  Administered 2015-04-23: 4 mg via INTRAVENOUS
  Filled 2015-04-23: qty 2

## 2015-04-23 MED ORDER — NITROGLYCERIN 1 MG/10 ML FOR IR/CATH LAB
INTRA_ARTERIAL | Status: AC
  Filled 2015-04-23: qty 10

## 2015-04-23 MED ORDER — NITROGLYCERIN 1 MG/10 ML FOR IR/CATH LAB
INTRA_ARTERIAL | Status: DC | PRN
  Administered 2015-04-23 (×8): 200 ug via INTRACORONARY

## 2015-04-23 MED ORDER — LABETALOL HCL 5 MG/ML IV SOLN: 20.0000 mg | INTRAVENOUS | Status: DC | PRN

## 2015-04-23 MED ORDER — SODIUM CHLORIDE 0.9 % IV SOLN
250.0000 mL | INTRAVENOUS | Status: DC | PRN
  Administered 2015-04-23: 250 mL via INTRAVENOUS

## 2015-04-23 MED ORDER — LABETALOL HCL 5 MG/ML IV SOLN
20.0000 mg | Freq: Once | INTRAVENOUS | Status: AC
  Administered 2015-04-23 (×2): 20 mg via INTRAVENOUS

## 2015-04-23 MED ORDER — NITROGLYCERIN 1 MG/10 ML FOR IR/CATH LAB
INTRA_ARTERIAL | Status: DC | PRN
  Administered 2015-04-23: 200 ug via INTRA_ARTERIAL

## 2015-04-23 MED ORDER — SODIUM CHLORIDE 0.9 % IV SOLN
250.0000 mg | INTRAVENOUS | Status: DC | PRN
  Administered 2015-04-23 (×2): 1.75 mg/kg/h via INTRAVENOUS
  Administered 2015-04-23: 250 mg
  Administered 2015-04-23: 1.75 mg/kg/h via INTRAVENOUS

## 2015-04-23 MED ORDER — METOPROLOL TARTRATE 12.5 MG HALF TABLET
25.0000 mg | ORAL_TABLET | Freq: Two times a day (BID) | ORAL | Status: DC
Start: 2015-04-23 — End: 2015-04-25
  Administered 2015-04-24 – 2015-04-25 (×3): 25 mg via ORAL
  Filled 2015-04-23 (×3): qty 2

## 2015-04-23 MED ORDER — HEPARIN (PORCINE) IN NACL 2-0.9 UNIT/ML-% IJ SOLN
INTRAMUSCULAR | Status: AC
  Filled 2015-04-23: qty 500

## 2015-04-23 MED ORDER — MORPHINE SULFATE (PF) 2 MG/ML IV SOLN
INTRAVENOUS | Status: AC
  Administered 2015-04-23: 2 mg via INTRAVENOUS
  Filled 2015-04-23: qty 1

## 2015-04-23 MED ORDER — SODIUM CHLORIDE 0.9 % IJ SOLN: 3.0000 mL | Freq: Two times a day (BID) | INTRAMUSCULAR | Status: DC

## 2015-04-23 MED ORDER — VERAPAMIL HCL 2.5 MG/ML IV SOLN
INTRAVENOUS | Status: AC
  Filled 2015-04-23: qty 2

## 2015-04-23 MED ORDER — HYDROMORPHONE HCL 1 MG/ML IJ SOLN
INTRAMUSCULAR | Status: AC
  Filled 2015-04-23: qty 1

## 2015-04-23 MED ORDER — MIDAZOLAM HCL 2 MG/2ML IJ SOLN
INTRAMUSCULAR | Status: DC | PRN
  Administered 2015-04-23 (×2): 1 mg via INTRAVENOUS

## 2015-04-23 MED ORDER — ASPIRIN 81 MG PO CHEW
CHEWABLE_TABLET | ORAL | Status: AC
  Filled 2015-04-23: qty 1

## 2015-04-23 MED ORDER — ALPRAZOLAM 0.25 MG PO TABS: 0.5000 mg | ORAL_TABLET | Freq: Two times a day (BID) | ORAL | Status: DC | PRN

## 2015-04-23 MED ORDER — SODIUM CHLORIDE 0.9 % IV SOLN
1.7500 mg/kg/h | INTRAVENOUS | Status: DC
  Filled 2015-04-23: qty 250

## 2015-04-23 MED ORDER — SERTRALINE HCL 50 MG PO TABS
50.0000 mg | ORAL_TABLET | Freq: Every day | ORAL | Status: DC
  Administered 2015-04-23 – 2015-04-25 (×3): 50 mg via ORAL
  Filled 2015-04-23 (×3): qty 1

## 2015-04-23 MED ORDER — LIDOCAINE HCL (PF) 1 % IJ SOLN
INTRAMUSCULAR | Status: DC | PRN
  Administered 2015-04-23: 09:00:00

## 2015-04-23 MED ORDER — CLOPIDOGREL BISULFATE 75 MG PO TABS
75.0000 mg | ORAL_TABLET | Freq: Every day | ORAL | Status: DC
  Administered 2015-04-24 – 2015-04-25 (×2): 75 mg via ORAL
  Filled 2015-04-23 (×2): qty 1

## 2015-04-23 MED ORDER — LISINOPRIL 5 MG PO TABS
5.0000 mg | ORAL_TABLET | Freq: Every day | ORAL | Status: DC
  Administered 2015-04-23: 5 mg via ORAL
  Filled 2015-04-23: qty 1

## 2015-04-23 MED ORDER — NITROGLYCERIN IN D5W 200-5 MCG/ML-% IV SOLN
2.0000 ug/min | INTRAVENOUS | Status: DC
  Administered 2015-04-23: 10 ug/min via INTRAVENOUS
  Filled 2015-04-23: qty 250

## 2015-04-23 MED ORDER — DIAZEPAM 5 MG PO TABS
ORAL_TABLET | ORAL | Status: AC
  Filled 2015-04-23: qty 2

## 2015-04-23 MED ORDER — SODIUM CHLORIDE 0.9 % IV SOLN
INTRAVENOUS | Status: DC | PRN
  Administered 2015-04-23: 100 mL via INTRAVENOUS

## 2015-04-23 MED ORDER — MORPHINE SULFATE (PF) 10 MG/ML IV SOLN: 4.0000 mg | INTRAVENOUS | Status: DC | PRN

## 2015-04-23 MED ORDER — OXYCODONE-ACETAMINOPHEN 5-325 MG PO TABS
1.0000 | ORAL_TABLET | ORAL | Status: DC | PRN
  Administered 2015-04-24: 2 via ORAL
  Filled 2015-04-23: qty 2

## 2015-04-23 MED ORDER — MORPHINE SULFATE (PF) 4 MG/ML IV SOLN
4.0000 mg | Freq: Once | INTRAVENOUS | Status: AC
  Administered 2015-04-23: 4 mg via INTRAVENOUS

## 2015-04-23 MED ORDER — SODIUM CHLORIDE 0.9 % IV SOLN
1.7500 mg/kg/h | INTRAVENOUS | Status: DC
  Filled 2015-04-23 (×2): qty 250

## 2015-04-23 MED ORDER — HEPARIN (PORCINE) IN NACL 2-0.9 UNIT/ML-% IJ SOLN
INTRAMUSCULAR | Status: AC
  Filled 2015-04-23: qty 1000

## 2015-04-23 MED ORDER — BIVALIRUDIN 250 MG IV SOLR
INTRAVENOUS | Status: AC
  Filled 2015-04-23: qty 250

## 2015-04-23 MED ORDER — SODIUM CHLORIDE 0.9 % IJ SOLN
3.0000 mL | Freq: Two times a day (BID) | INTRAMUSCULAR | Status: DC
  Administered 2015-04-23 – 2015-04-25 (×4): 3 mL via INTRAVENOUS

## 2015-04-23 MED ORDER — HYDRALAZINE HCL 20 MG/ML IJ SOLN
10.0000 mg | Freq: Four times a day (QID) | INTRAMUSCULAR | Status: DC | PRN
  Administered 2015-04-23: 10 mg via INTRAVENOUS

## 2015-04-23 MED ORDER — HYDROMORPHONE HCL 1 MG/ML IJ SOLN
INTRAMUSCULAR | Status: DC | PRN
  Administered 2015-04-23 (×2): 1 mg via INTRAVENOUS

## 2015-04-23 MED ORDER — HEPARIN SODIUM (PORCINE) 1000 UNIT/ML IJ SOLN
INTRAMUSCULAR | Status: DC | PRN
  Administered 2015-04-23: 1000 [IU] via INTRAVENOUS
  Administered 2015-04-23: 5000 [IU] via INTRAVENOUS

## 2015-04-23 MED ORDER — FENTANYL CITRATE (PF) 100 MCG/2ML IJ SOLN
INTRAMUSCULAR | Status: DC | PRN
  Administered 2015-04-23: 50 ug via INTRAVENOUS
  Administered 2015-04-23: 25 ug via INTRAVENOUS

## 2015-04-23 MED ORDER — ACETAMINOPHEN 325 MG PO TABS
650.0000 mg | ORAL_TABLET | ORAL | Status: DC | PRN
  Administered 2015-04-24: 650 mg via ORAL
  Filled 2015-04-23: qty 2

## 2015-04-23 MED ORDER — LIDOCAINE HCL (PF) 1 % IJ SOLN
INTRAMUSCULAR | Status: AC
  Filled 2015-04-23: qty 30

## 2015-04-23 MED ORDER — DIAZEPAM 5 MG PO TABS
10.0000 mg | ORAL_TABLET | ORAL | Status: AC
  Administered 2015-04-23: 10 mg via ORAL

## 2015-04-23 MED ORDER — SODIUM CHLORIDE 0.9 % IV SOLN
INTRAVENOUS | Status: DC
  Administered 2015-04-23: 06:00:00 via INTRAVENOUS

## 2015-04-23 MED ORDER — HEPARIN (PORCINE) IN NACL 2-0.9 UNIT/ML-% IJ SOLN
INTRAMUSCULAR | Status: DC | PRN
  Administered 2015-04-23: 08:00:00 via INTRA_ARTERIAL

## 2015-04-23 MED ORDER — CLOPIDOGREL BISULFATE 300 MG PO TABS
ORAL_TABLET | ORAL | Status: DC | PRN
  Administered 2015-04-23: 600 mg via ORAL

## 2015-04-23 MED ORDER — FENTANYL CITRATE (PF) 100 MCG/2ML IJ SOLN
INTRAMUSCULAR | Status: DC | PRN
  Administered 2015-04-23 (×2): 50 ug via INTRAVENOUS
  Administered 2015-04-23: 25 ug via INTRAVENOUS

## 2015-04-23 MED ORDER — ASPIRIN EC 81 MG PO TBEC
81.0000 mg | DELAYED_RELEASE_TABLET | Freq: Every day | ORAL | Status: DC
  Administered 2015-04-24 – 2015-04-25 (×2): 81 mg via ORAL
  Filled 2015-04-23 (×3): qty 1

## 2015-04-23 MED ORDER — SODIUM CHLORIDE 0.9 % IV SOLN: 250.0000 mL | INTRAVENOUS | Status: DC | PRN

## 2015-04-23 MED ORDER — HYDRALAZINE HCL 20 MG/ML IJ SOLN
INTRAMUSCULAR | Status: AC
  Filled 2015-04-23: qty 1

## 2015-04-23 MED ORDER — BIVALIRUDIN BOLUS VIA INFUSION - CUPID
INTRAVENOUS | Status: DC | PRN
  Administered 2015-04-23: 88.8 mg via INTRAVENOUS

## 2015-04-23 MED ORDER — ATORVASTATIN CALCIUM 40 MG PO TABS
40.0000 mg | ORAL_TABLET | Freq: Every day | ORAL | Status: DC
  Administered 2015-04-23: 40 mg via ORAL
  Filled 2015-04-23: qty 1

## 2015-04-23 MED ORDER — SODIUM CHLORIDE 0.9 % IV SOLN
INTRAVENOUS | Status: AC
  Administered 2015-04-23: 11:00:00 via INTRAVENOUS

## 2015-04-23 SURGICAL SUPPLY — 37 items
BALLN EMERGE MR 2.0X12 (BALLOONS) ×4
BALLN EMERGE MR 2.5X15 (BALLOONS) ×2
BALLN EMERGE MR 2.5X20 (BALLOONS) ×2
BALLN EUPHORA RX 1.5X12 (BALLOONS) ×2
BALLN ~~LOC~~ EMERGE MR 2.0X15 (BALLOONS) ×2
BALLN ~~LOC~~ EUPHORA RX 4.0X12 (BALLOONS) ×2
BALLN ~~LOC~~ TREK RX 3.0X15 (BALLOONS) ×2
BALLOON EMERGE MR 2.0X12 (BALLOONS) IMPLANT
BALLOON EMERGE MR 2.5X15 (BALLOONS) IMPLANT
BALLOON EMERGE MR 2.5X20 (BALLOONS) IMPLANT
BALLOON EUPHORA RX 1.5X12 (BALLOONS) IMPLANT
BALLOON ~~LOC~~ EMERGE MR 2.0X15 (BALLOONS) IMPLANT
BALLOON ~~LOC~~ EUPHORA RX 4.0X12 (BALLOONS) IMPLANT
BALLOON ~~LOC~~ TREK RX 3.0X15 (BALLOONS) IMPLANT
CATH INFINITI 5 FR JL3.5 (CATHETERS) ×2 IMPLANT
CATH INFINITI 5FR ANG PIGTAIL (CATHETERS) ×2 IMPLANT
CATH INFINITI JR4 5F (CATHETERS) ×2 IMPLANT
CATH LAUNCHER 5F JL3 (CATHETERS) IMPLANT
CATH VISTA GUIDE 6FR JL3 (CATHETERS) ×1 IMPLANT
CATH VISTA GUIDE 6FR XB3 (CATHETERS) ×1 IMPLANT
CATHETER LAUNCHER 5F JL3 (CATHETERS) ×2
DEVICE RAD COMP TR BAND LRG (VASCULAR PRODUCTS) ×2 IMPLANT
GLIDESHEATH SLEND SS 6F .021 (SHEATH) ×2 IMPLANT
GUIDELINER 6F (CATHETERS) ×1 IMPLANT
KIT ENCORE 26 ADVANTAGE (KITS) ×1 IMPLANT
KIT HEART LEFT (KITS) ×2 IMPLANT
PACK CARDIAC CATHETERIZATION (CUSTOM PROCEDURE TRAY) ×2 IMPLANT
STENT PROMUS PREM MR 2.75X28 (Permanent Stent) ×1 IMPLANT
STENT SYNERGY DES 2.75X28 (Permanent Stent) ×1 IMPLANT
STENT SYNERGY DES 3.5X16 (Permanent Stent) ×1 IMPLANT
SYR MEDRAD MARK V 150ML (SYRINGE) ×2 IMPLANT
TRANSDUCER W/STOPCOCK (MISCELLANEOUS) ×2 IMPLANT
TUBING CIL FLEX 10 FLL-RA (TUBING) ×2 IMPLANT
WIRE COUGAR XT STRL 190CM (WIRE) ×2 IMPLANT
WIRE COUGAR XT STRL 300CM (WIRE) ×1 IMPLANT
WIRE HI TORQ WHISPER MS 190CM (WIRE) ×3 IMPLANT
WIRE SAFE-T 1.5MM-J .035X260CM (WIRE) ×2 IMPLANT

## 2015-04-23 NOTE — Interval H&P Note (Signed)
History and Physical Interval Note:  04/23/2015 7:35 AM  Peter Russo  has presented today for surgery, with the diagnosis of cp  The various methods of treatment have been discussed with the patient and family. After consideration of risks, benefits and other options for treatment, the patient has consented to  Procedure(s): Left Heart Cath and Coronary Angiography (N/A) as a surgical intervention .  The patient's history has been reviewed, patient examined, no change in status, stable for surgery.  I have reviewed the patient's chart and labs.  Questions were answered to the patient's satisfaction.     Charlton Haws

## 2015-04-23 NOTE — H&P (View-Only) (Signed)
CARDIOLOGY OFFICE NOTE  Date:  04/10/2015    Peter Russo Date of Birth: 06-27-1976 Medical Record #[id number]  PCP:  Ambrose Finland, NP  Cardiologist:  Eden Emms    Chief Complaint  Patient presents with  . Coronary Artery Disease    FU visit - seen for Dr. Eden Emms    History of Present Illness: Peter Russo is a 39 y.o. male who presents today for a follow up visit. Seen for Dr .Eden Emms. He has known CAD with prior BMS to mid LAD in 2010.  F/U cath 3/12 with patent LAD stent and new DES stents to RCA.  He has been noncompliant with his follow up.   Has not been seen here for two years.   Comes back today. Here alone. Late for his visit today. Lots of complaints. Says he is "living". Complains of chest pain, dizzy spells and stress. Has no insurance. Had been off of his medicines but back on now - had been off of for probably a year - back on just a couple of weeks. His orange card expired. Chest pain is hard for him to describe - maybe a little tight - says it is "hard to explain" Feels like "something is moving". Wakes him up at night. Short of breath with exertion. Calf muscles tighten up. Jaw hurts with chewing. Says his chest pain is bothering him the most. He notes that this chest pain is similar to his prior chest pain syndrome. He notes that when he walks his chest will hurt and he gets very short of breath. He continues to smoke. He has applied for disability. Lots of stress with money issues - has child support. No NTG on hand.   Past Medical History  Diagnosis Date  . SVT (supraventricular tachycardia) APRROX 10 YRS AGO    PRESUMED AVNRT, ECHO 12/07 WITH EF 65%, MILD LAE  . GERD (gastroesophageal reflux disease)   . Hypertension   . Myocardial infarct   . Hypercholesteremia     Past Surgical History  Procedure Laterality Date  . Coronary stent placement       Medications: Current Outpatient Prescriptions  Medication Sig Dispense Refill  . ALPRAZolam  (XANAX) 0.5 MG tablet Take 1 tablet (0.5 mg total) by mouth 2 (two) times daily as needed for anxiety. 30 tablet 0  . aspirin EC 81 MG tablet Take 81 mg by mouth daily.    Marland Kitchen lisinopril (PRINIVIL,ZESTRIL) 5 MG tablet Take 1 tablet (5 mg total) by mouth daily. 30 tablet 5  . sertraline (ZOLOFT) 50 MG tablet Take 1 tablet (50 mg total) by mouth daily. 30 tablet 2  . simvastatin (ZOCOR) 80 MG tablet Take 1 tablet (80 mg total) by mouth daily at 6 PM. 30 tablet 5  . triamcinolone cream (KENALOG) 0.1 % Apply 1 application topically 2 (two) times daily. 30 g 0  . nitroGLYCERIN (NITROSTAT) 0.4 MG SL tablet Place 1 tablet (0.4 mg total) under the tongue every 5 (five) minutes as needed for chest pain. 25 tablet 3   No current facility-administered medications for this visit.    Allergies: No Known Allergies  Social History: The patient  reports that he has been smoking Cigarettes.  He has been smoking about 1.00 pack per day. He does not have any smokeless tobacco history on file. He reports that he uses illicit drugs (Marijuana). He reports that he does not drink alcohol.   Family History: The patient's family history includes Arrhythmia  in his mother; Coronary artery disease (age of onset: 25) in his father; Heart disease in his father.   Review of Systems: Please see the history of present illness.   Otherwise, the review of systems is positive for none.   All other systems are reviewed and negative.   Physical Exam: VS:  BP 130/90 mmHg  Pulse 71  Ht  (1.702 m)  Wt 261 lb (118.389 kg)  BMI 40.87 kg/m2 .  BMI Body mass index is 40.87 kg/(m^2).  Wt Readings from Last 3 Encounters:  04/10/15 261 lb (118.389 kg)  12/26/14 266 lb (120.657 kg)  12/19/14 264 lb 8 oz (119.976 kg)    General: Alert. He is obese. He is in no acute distress. He got quite tearful during our visit.  HEENT: Normal. Neck: Supple, no JVD, carotid bruits, or masses noted.  Cardiac: Regular rate and rhythm. No  murmurs, rubs, or gallops. No edema.  Respiratory:  Lungs are clear to auscultation bilaterally with normal work of breathing.  GI: Soft and nontender.  MS: No deformity or atrophy. Gait and ROM intact. Skin: Warm and dry. Color is normal.  Neuro:  Strength and sensation are intact and no gross focal deficits noted.  Psych: Alert, appropriate and with normal affect.   LABORATORY DATA:  EKG:  EKG is ordered today. This demonstrates NSR - inferior Q's.  Lab Results  Component Value Date   WBC 10.4 12/19/2014   HGB 15.0 12/19/2014   HCT 42.7 12/19/2014   PLT 217 12/19/2014   GLUCOSE 117* 12/19/2014   CHOL 259* 12/26/2014   TRIG 286* 12/26/2014   HDL 32* 12/26/2014   LDLCALC 170* 12/26/2014   ALT 28 03/01/2013   AST 20 03/01/2013   NA 140 12/19/2014   K 4.1 12/19/2014   CL 105 12/19/2014   CREATININE 1.10 12/19/2014   BUN 10 12/19/2014   CO2 26 12/19/2014   TSH 0.534 12/26/2014   INR 0.97 11/01/2010   HGBA1C 6.2* 12/26/2014    BNP (last 3 results) No results for input(s): BNP in the last 8760 hours.  ProBNP (last 3 results) No results for input(s): PROBNP in the last 8760 hours.   Other Studies Reviewed Today:  ANGIOGRAPHIC FINDINGS FROM 10/2010: 1. There was no left main artery. The circumflex and the left  anterior descending artery arose from separate ostia. 2. The left anterior descending artery was a moderate-to-large sized  vessel that coursed to the apex and gave off a large septal  perforating branch and a moderate-sized diagonal branch. The mid  LAD had a patent stented segment with no evidence of restenosis.  The proximal and distal vessel had mild 30% plaque but no flow-  limiting lesions. 3. The circumflex artery gave off a large caliber obtuse marginal  branch. The AV groove circumflex branch was relatively small  beyond the takeoff of this obtuse marginal branch. There was a 50-  60% proximal stenosis. The obtuse  marginal branch had a 30%  stenosis. These lesions appeared unchanged from prior  catheterization 2 years ago and were not flow-limiting. 4. The right coronary artery is a moderate-sized vessel with diffuse  70% stenosis throughout the proximal portion of the vessel followed  by 100% occlusion in the midportion of the vessel. 5. No left ventricular angiogram was performed.  IMPRESSION: 1. Non-ST-elevation myocardial infarction secondary to occluded right  coronary artery. 2. Patent stent in the mid left anterior descending artery. 3. Moderate disease in the circumflex artery. 4.  Successful percutaneous transluminal coronary angioplasty with  placement of 2 overlapping drug-eluting stents in the right  coronary artery in the proximal and midportion.  RECOMMENDATIONS: We will continue medical treatment with aspirin and Plavix for at least 1 year. We will also continue his beta-blocker and statin. We will discontinue his intravenous heparin drip. The patient will be watched closely in the step-down ICU tonight.   Verne Carrow, MD   Assessment/Plan: 1. CAD with prior MI x 2 with prior PCI to the LAD, then to the RCA and has known residual disease in the LCX of a moderate nature. Presents back today with chest pain and shortness of breath - pretty identical to his prior chest pain syndrome. I think cardiac catheterization is his best option. The patient understands that risks include but are not limited to stroke (1 in 1000), death (1 in 1000), kidney failure [usually temporary] (1 in 500), bleeding (1 in 200), allergic reaction [possibly serious] (1 in 200), and agrees to proceed. I have tried to explain to him that if his anatomy is ok then maybe he can look at other job alternatives, if his anatomy is not ok - then we need to get this stabilized or he will have no job options. NTG sent in today and he is reminded how to take.   2. Dyspnea - in the  setting of chest pain.   3. HTN - BP fair - on current regimen.  4. HLD - just got back on statin  5. Obesity  6. Tobacco abuse - he is not ready to stop.   Current medicines are reviewed with the patient today.  The patient does not have concerns regarding medicines other than what has been noted above.  The following changes have been made:  See above.  Labs/ tests ordered today include:    Orders Placed This Encounter  Procedures  . Basic metabolic panel  . CBC  . Hepatic function panel  . Lipid panel  . APTT  . Protime-INR  . EKG 12-Lead     Disposition:   Cardiac cath with Dr. Eden Emms on September 8th.   Patient is agreeable to this plan and will call if any problems develop in the interim.   Signed: Rosalio Macadamia, RN, ANP-C 04/10/2015 11:09 AM  [facility] [address] [place], [place]  [address] Phone: [phone] Fax: [phone]

## 2015-04-24 ENCOUNTER — Encounter (HOSPITAL_COMMUNITY): Payer: Self-pay | Admitting: Cardiovascular Disease

## 2015-04-24 DIAGNOSIS — I214 Non-ST elevation (NSTEMI) myocardial infarction: Secondary | ICD-10-CM

## 2015-04-24 LAB — BASIC METABOLIC PANEL
Anion gap: 10 (ref 5–15)
BUN: 7 mg/dL (ref 6–20)
CO2: 28 mmol/L (ref 22–32)
Calcium: 8.9 mg/dL (ref 8.9–10.3)
Chloride: 101 mmol/L (ref 101–111)
Creatinine, Ser: 1.12 mg/dL (ref 0.61–1.24)
GFR calc Af Amer: >60 mL/min (ref >60)
GFR calc non Af Amer: >60 mL/min (ref >60)
Glucose, Bld: 121 mg/dL — ABNORMAL HIGH (ref 65–99)
Potassium: 4.1 mmol/L (ref 3.5–5.1)
Sodium: 139 mmol/L (ref 135–145)

## 2015-04-24 LAB — CK TOTAL AND CKMB (NOT AT ARMC)
CK, MB: 65.3 ng/mL — ABNORMAL HIGH (ref 0.5–5.0)
Relative Index: 7.9 — ABNORMAL HIGH (ref 0.0–2.5)
Total CK: 822 U/L — ABNORMAL HIGH (ref 49–397)

## 2015-04-24 LAB — CBC
HCT: 41.8 % (ref 39.0–52.0)
Hemoglobin: 14.5 g/dL (ref 13.0–17.0)
MCH: 30.1 pg (ref 26.0–34.0)
MCHC: 34.7 g/dL (ref 30.0–36.0)
MCV: 86.7 fL (ref 78.0–100.0)
Platelets: 218 10*3/uL (ref 150–400)
RBC: 4.82 MIL/uL (ref 4.22–5.81)
RDW: 13.8 % (ref 11.5–15.5)
WBC: 13 10*3/uL — ABNORMAL HIGH (ref 4.0–10.5)

## 2015-04-24 MED ORDER — HEPARIN SODIUM (PORCINE) 5000 UNIT/ML IJ SOLN
5000.0000 [IU] | Freq: Three times a day (TID) | INTRAMUSCULAR | Status: DC
Start: 2015-04-24 — End: 2015-04-25
  Administered 2015-04-24 – 2015-04-25 (×3): 5000 [IU] via SUBCUTANEOUS
  Filled 2015-04-24 (×4): qty 1

## 2015-04-24 MED ORDER — LISINOPRIL 10 MG PO TABS
10.0000 mg | ORAL_TABLET | Freq: Every day | ORAL | Status: DC
  Administered 2015-04-24 – 2015-04-25 (×2): 10 mg via ORAL
  Filled 2015-04-24 (×2): qty 1

## 2015-04-24 MED ORDER — ALUM & MAG HYDROXIDE-SIMETH 200-200-20 MG/5ML PO SUSP
30.0000 mL | ORAL | Status: DC | PRN
  Administered 2015-04-24: 30 mL via ORAL
  Filled 2015-04-24: qty 30

## 2015-04-24 MED ORDER — ATORVASTATIN CALCIUM 80 MG PO TABS
80.0000 mg | ORAL_TABLET | Freq: Every day | ORAL | Status: DC
  Administered 2015-04-24: 80 mg via ORAL
  Filled 2015-04-24: qty 1

## 2015-04-24 NOTE — Progress Notes (Signed)
    Subjective:  Denies CP or dyspnea   Objective:  Filed Vitals:   04/24/15 0300 04/24/15 0400 04/24/15 0500 04/24/15 0600  BP: 137/68 152/77 137/66 133/67  Pulse:      Temp:  98.3 F (36.8 C)    TempSrc:  Axillary    Resp: Height:      Weight:      SpO2: 97% 99% 97% 95%    Intake/Output from previous day:  Intake/Output Summary (Last 24 hours) at 04/24/15 0755 Last data filed at 04/24/15 0600  Gross per 24 hour  Intake  961.2 ml  Output   1300 ml  Net -338.8 ml    Physical Exam: Physical exam: Well-developed well-nourished in no acute distress.  Skin is warm and dry.  HEENT is normal.  Neck is supple.  Chest is clear to auscultation with normal expansion.  Cardiovascular exam is regular rate and rhythm.  Abdominal exam nontender or distended. No masses palpated. Extremities show no edema. Radial cath site with no hematoma neuro grossly intact    Lab Results: Basic Metabolic Panel:  Recent Labs  47/82/95 0352  NA 139  K 4.1  CL 101  CO2 28  GLUCOSE 121*  BUN 7  CREATININE 1.12  CALCIUM 8.9   CBC:  Recent Labs  04/24/15 0352  WBC 13.0*  HGB 14.5  HCT 41.8  MCV 86.7  PLT 218   Cardiac Enzymes:  Recent Labs  04/23/15 1733 04/23/15 2257  CKTOTAL 613* 822*  CKMB 37.7* 65.3*     Assessment/Plan:  39 year old male admitted with angina. Catheterization revealed an occluded right coronary artery, 80% circumflex, 80% mid to distal LAD. Ejection fraction 60%. Patient subsequently had PCI of the circumflex and LAD complicated by occlusion of the diagonal with non-ST elevation myocardial infarction.  1 coronary artery disease-status post PCI of the left circumflex with drug-eluting stent and PCI of LAD complicated by dissection with overlapping stents placed. Patient has ruled in for NSTEMI with CK-MB 65.3. Plan to continue aspirin, Plavix, statin and metoprolol. Wean nitroglycerin to off. 2 hypertension-continue ACE inhibitor and  beta blocker. Increase lisinopril to 10 mg daily. Follow blood pressure and advance medications as needed. 3 hyperlipidemia-increase Lipitor to 80 mg daily. 4 tobacco abuse-patient counseled on discontinuing.  Olga Millers 04/24/2015, 7:55 AM

## 2015-04-24 NOTE — Progress Notes (Signed)
CARDIAC REHAB PHASE I   PRE:  Rate/Rhythm: 76 SR  BP:  Supine: 130/69  Sitting:   Standing:    SaO2: 97%RA  1124-1214 Pt did not feel he could walk right now due to headache and nausea. Began MI education with pt who voiced understanding. Discussed importance of plavix with stents. Reviewed NTG use, heart healthy choices with food, risk factors especially stress. Pt tearful and emotional support given. Encouraged pt to walk later with staff when feeling better. Will need ex ed and Phase 2 tomorrow.  Discussed smoking cessation and gave handout and fake cigarette. Pt stated he plans to quit. Luetta Nutting RN BSN 04/24/2015 12:24 PM            Luetta Nutting, RN BSN  04/24/2015 12:10 PM

## 2015-04-25 DIAGNOSIS — I10 Essential (primary) hypertension: Secondary | ICD-10-CM

## 2015-04-25 DIAGNOSIS — Z720 Tobacco use: Secondary | ICD-10-CM

## 2015-04-25 DIAGNOSIS — E785 Hyperlipidemia, unspecified: Secondary | ICD-10-CM

## 2015-04-25 LAB — BASIC METABOLIC PANEL
Anion gap: 7 (ref 5–15)
BUN: 7 mg/dL (ref 6–20)
CO2: 28 mmol/L (ref 22–32)
Calcium: 8.9 mg/dL (ref 8.9–10.3)
Chloride: 102 mmol/L (ref 101–111)
Creatinine, Ser: 1.06 mg/dL (ref 0.61–1.24)
GFR calc Af Amer: >60 mL/min (ref >60)
GFR calc non Af Amer: >60 mL/min (ref >60)
Glucose, Bld: 111 mg/dL — ABNORMAL HIGH (ref 65–99)
Potassium: 3.7 mmol/L (ref 3.5–5.1)
Sodium: 137 mmol/L (ref 135–145)

## 2015-04-25 LAB — CBC
HCT: 42.2 % (ref 39.0–52.0)
Hemoglobin: 14.5 g/dL (ref 13.0–17.0)
MCH: 29.7 pg (ref 26.0–34.0)
MCHC: 34.4 g/dL (ref 30.0–36.0)
MCV: 86.5 fL (ref 78.0–100.0)
Platelets: 218 10*3/uL (ref 150–400)
RBC: 4.88 MIL/uL (ref 4.22–5.81)
RDW: 13.6 % (ref 11.5–15.5)
WBC: 12.8 10*3/uL — ABNORMAL HIGH (ref 4.0–10.5)

## 2015-04-25 MED ORDER — CLOPIDOGREL BISULFATE 75 MG PO TABS: 75.0000 mg | ORAL_TABLET | Freq: Every day | ORAL | Status: DC

## 2015-04-25 MED ORDER — ATORVASTATIN CALCIUM 80 MG PO TABS: 80.0000 mg | ORAL_TABLET | Freq: Every day | ORAL | Status: DC

## 2015-04-25 MED ORDER — METOPROLOL TARTRATE 25 MG PO TABS: 25.0000 mg | ORAL_TABLET | Freq: Two times a day (BID) | ORAL | Status: DC

## 2015-04-25 NOTE — Discharge Instructions (Signed)

## 2015-04-25 NOTE — Progress Notes (Signed)
    Subjective:  Denies CP or dyspnea Wants to go home  Objective:  Filed Vitals:   04/25/15 0700 04/25/15 0900 04/25/15 1050 04/25/15 1207  BP:  143/75 164/88 151/79  Pulse:    59  Temp: 98.2 F (36.8 C)   97.9 F (36.6 C)  TempSrc: Oral   Oral  Resp:  Height:      Weight:      SpO2:  95%      Intake/Output from previous day:  Intake/Output Summary (Last 24 hours) at 04/25/15 1400 Last data filed at 04/25/15 0900  Gross per 24 hour  Intake    440 ml  Output   1750 ml  Net  -1310 ml    Physical Exam: Physical exam: Well-developed well-nourished in no acute distress.  Skin is warm and dry.  HEENT is normal.  Neck is supple.  Chest is clear to auscultation with normal expansion.  Cardiovascular exam is regular rate and rhythm.  Abdominal exam nontender or distended. No masses palpated. Extremities show no edema. Radial cath site with no hematoma neuro grossly intact    Lab Results: Basic Metabolic Panel:  Recent Labs  30/86/57 0352 04/25/15 0303  NA 139 137  K 4.1 3.7  CL 101 102  CO2 28 28  GLUCOSE 121* 111*  BUN 7 7  CREATININE 1.12 1.06  CALCIUM 8.9 8.9   CBC:  Recent Labs  04/24/15 0352 04/25/15 0303  WBC 13.0* 12.8*  HGB 14.5 14.5  HCT 41.8 42.2  MCV 86.7 86.5  PLT 218 218   Cardiac Enzymes:  Recent Labs  04/23/15 1733 04/23/15 2257  CKTOTAL 613* 822*  CKMB 37.7* 65.3*     Assessment/Plan:  39 year old male admitted with angina. Catheterization revealed an occluded right coronary artery, 80% circumflex, 80% mid to distal LAD. Ejection fraction 60%. Patient subsequently had PCI of the circumflex and LAD complicated by occlusion of the diagonal with non-ST elevation myocardial infarction.  1 coronary artery disease-status post PCI of the left circumflex with drug-eluting stent and PCI of LAD complicated by dissection with overlapping stents placed. Patient has ruled in for NSTEMI with CK-MB 65.3. Plan to continue  aspirin, Plavix, statin and metoprolol.   2 hypertension-continue ACE inhibitor and beta blocker.  Follow blood pressure and advance medications as needed. 3 hyperlipidemia-increase Lipitor to 80 mg daily. 4 tobacco abuse-patient counseled on discontinuing.  Cardiac rehab at discharge  DC to home with close outpatient follow-up  Hillis Range 04/25/2015, 2:00 PM

## 2015-04-25 NOTE — Progress Notes (Signed)
Pt c/o chest pain when taking a deep breath. Pt vital signs stable b/p 121/66, hr 82. Pt given 4 mg morphine IV and EKG obtained. EKG unremarkable.  Will continue to monitor pt.

## 2015-04-25 NOTE — Progress Notes (Signed)
Discharge teaching completed with pt. Including medication and site instructions. Pt verbalized understanding of all instructions using the teachback method. IV removed. Pt taken to Pt pickup via WC And assisted into the vehicle.

## 2015-04-25 NOTE — Progress Notes (Signed)
CARDIAC REHAB PHASE I   PRE:  Rate/Rhythm: SR 72  BP:  Supine:   Sitting: 150/60  Standing:    SaO2: 97 RA  MODE:  Ambulation: 450 ft   POST:  Rate/Rhythm: SR 99  BP:  Supine:   Sitting: 164/88  Standing:    SaO2: 97 RA  Pt up to ambulate in hallway accompanied by rehab staff x 450 feet.  Pt tolerated well with no complaints of cp or sob.  Pt to side of bed, soda provided.  Reviewed exercise guidelines and progression.  Pt given cardiac rehab brochure for [facility].  Pt given financial assist paperwork to be completed for eligibility for assistance from [employer].  Advised pt that this will need to be completed  asap and reviewed prior to participation in CR if eligible.  Pt given contact numbers for financial counselor once form completed. No other needs identified at this time. [phone] [employer]/product designer, [employer]

## 2015-04-25 NOTE — Discharge Summary (Signed)
Discharge Summary   Patient ID: Peter Russo MRN: [id number], DOB/AGE: 02-15-1976 39 y.o. Admit date: 04/23/2015 D/C date:     04/25/2015  Primary Cardiologist: Dr. Johnsie Cancel  Principal Problem:   NSTEMI (non-ST elevated myocardial infarction) Active Problems:   CAD (coronary artery disease)   Smoking   Hypertension   Mixed hyperlipidemia   GERD (gastroesophageal reflux disease)   Obesity   Depression   GAD (generalized anxiety disorder)   Prediabetes   Exertional angina    Admission Dates: 04/23/15-04/25/15 Discharge Diagnosis: NSTEMI s/p DES to LCx, overlapping DESs to mLAD. Very complex procedure c/b extensive dissection and sidebranch occlusion of the diagonal, treated successfully with stenting of the LAD dissection and extensive balloon angioplasty of the diagonal dissection.and DES to LAD dissection and extensive balloon angioplasty of the diagonal dissection.  HPI: Peter Russo is a 39 y.o. male with a history of HTN, HLD, ongoing tobacco abuse, CAD s/p BMS to mid LAD in 2010; DES to RCA in 2012, and non compliance who was seen in the office for exertional chest discomfort and referred for outpatient cardiac catheterization on 04/23/15 with Dr. Johnsie Cancel.   Seen by Truitt Merle NP on 04/10/15 with lots of complaints. Says he is "living". Complains of chest pain, dizzy spells and stress. Has no insurance. Had been off of his medicines but back on now - had been off of for probably a year - back on just a couple of weeks. His orange card expired. Chest pain is hard for him to describe - maybe a little tight - says it is "hard to explain" Feels like "something is moving". Wakes him up at night. Short of breath with exertion. Calf muscles tighten up. Jaw hurts with chewing. Says his chest pain is bothering him the most. He notes that this chest pain is similar to his prior chest pain syndrome. He notes that when he walks his chest will hurt and he gets very short of breath. He  continues to smoke. He has applied for disability. Lots of stress with money issues - has child support.   Hospital Course  1 coronary artery disease-Catheterization revealed an occluded RCA, 80% circumflex, 80% mid to distal LAD. EF 60%. Patient subsequently had PCI of the circumflex and LAD complicated by occlusion of the diagonal with non-STEMI. (cath note below) -- Plan to continue aspirin, Plavix, statin and metoprolol.  -- Cardiac rehab at discharge  2 hypertension-continue ACE inhibitor and beta blocker. Follow blood pressure and advance medications as needed.  3 hyperlipidemia-increase Lipitor to 80 mg daily.  4 tobacco abuse-patient counseled on discontinuing.  The patient has had an uncomplicated hospital course and is recovering well. The radial catheter site is stable. He has been seen by Dr. Rayann Heman today and deemed ready for discharge home. Staff message for follow-up appointments have been scheduled. Smoking cessation was disscussed in length. Discharge medications are listed below. Will arrange a TOC appt next week.    Discharge Vitals: Blood pressure 143/83, pulse 70, temperature 97 F (36.1 C), temperature source Oral, resp. rate 19, height 5' 10"  (1.778 m), weight 268 lb 15.4 oz (122 kg), SpO2 100 %.  Labs: Lab Results  Component Value Date   WBC 12.8* 04/25/2015   HGB 14.5 04/25/2015   HCT 42.2 04/25/2015   MCV 86.5 04/25/2015   PLT 218 04/25/2015     Recent Labs Lab 04/25/15 0303  NA 137  K 3.7  CL 102  CO2 28  BUN 7  CREATININE 1.06  CALCIUM 8.9  GLUCOSE 111*    Recent Labs  04/23/15 1733 04/23/15 2257  CKTOTAL 613* 822*  CKMB 37.7* 65.3*   Lab Results  Component Value Date   CHOL 243* 04/17/2015   HDL 28* 04/17/2015   LDLCALC NOT CALC 04/17/2015   TRIG 472* 04/17/2015     Diagnostic Studies/Procedures    04/23/15  Conclusion     Prox Cx lesion, 90% stenosed. There is a 0% residual stenosis post intervention.  A drug-eluting  stent was placed.  Mid LAD to Dist LAD lesion, 80% stenosed. There is a 0% residual stenosis post intervention. The lesion was previously treated with a stent (unknown type) .  A drug-eluting stent was placed.  Ost 2nd Diag to 2nd Diag lesion, 100% stenosed. There is a 40% residual stenosis post intervention.  1. Severe left circumflex stenosis treated successfully with PCI using a Synergy DES 2. Severe complex mid LAD stenosis treated successfully using PCI with overlapping drug-eluting stents. Very complex procedure complicated by extensive dissection and sidebranch occlusion of the diagonal, treated successfully with stenting of the LAD dissection and extensive balloon angioplasty of the diagonal dissection.  Recommend: Dual antiplatelets therapy with aspirin and clopidogrel for at least 12 months. Will continue Angiomax another 2 hours post procedure and the patient will be transferred to the TCU for further monitoring. Will cycle serial enzymes and expect to see cardiac enzyme rise due to procedural complexity/transient vessel occlusion.     Coronary Findings    Dominance: Right   Left Anterior Descending   . Mid LAD to Dist LAD lesion, 80% stenosed. The lesion was previously treated with a stent (unknown type) . Severe angulation   . PCI: The pre-interventional distal flow is normal (TIMI 3). Pre-stent angioplasty was performed. A drug-eluting stent was placed. Post-stent angioplasty was performed. The post-interventional distal flow is normal (TIMI 3). The intervention was successful. At this lesion, a dissection occurred.  . There is no residual stenosis post intervention.     . Second Diagonal Branch   . Ost 2nd Diag to 2nd Diag lesion, 100% stenosed. Extensive dissection post - LAD PCI, treated with POBA. See procedural description   . PCI: The pre-interventional distal flow is normal (TIMI 3). Angioplasty alone was performed. A stent was not placed. The post-interventional  distal flow is normal (TIMI 3). The intervention was successful. At this lesion, a dissection occurred.  . There is a 40% residual stenosis post intervention.       Left Circumflex   . Prox Cx lesion, 90% stenosed. discrete .   Marland Kitchen PCI: The pre-interventional distal flow is normal (TIMI 3). Pre-stent angioplasty was performed. A drug-eluting stent was placed. The strut is apposed. Post-stent angioplasty was performed. Maximum pressure: 16 atm. The post-interventional distal flow is normal (TIMI 3). The intervention was successful. No complications occurred at this lesion. See procedure note. 3.5x16 mm Synergy DES placed.  . There is no residual stenosis post intervention.        Coronary Diagrams    Diagnostic Diagram           Post-Intervention Diagram           Discharge Medications     Medication List    STOP taking these medications        simvastatin 80 MG tablet  Commonly known as:  ZOCOR  Replaced by:  atorvastatin 80 MG tablet      TAKE these medications  ALPRAZolam 0.5 MG tablet  Commonly known as:  XANAX  Take 1 tablet (0.5 mg total) by mouth 2 (two) times daily as needed for anxiety.     aspirin EC 81 MG tablet  Take 81 mg by mouth daily.     atorvastatin 80 MG tablet  Commonly known as:  LIPITOR  Take 1 tablet (80 mg total) by mouth daily at 6 PM.     clopidogrel 75 MG tablet  Commonly known as:  PLAVIX  Take 1 tablet (75 mg total) by mouth daily with breakfast.     lisinopril 5 MG tablet  Commonly known as:  PRINIVIL,ZESTRIL  Take 1 tablet (5 mg total) by mouth daily.     metoprolol tartrate 25 MG tablet  Commonly known as:  LOPRESSOR  Take 1 tablet (25 mg total) by mouth 2 (two) times daily.     nitroGLYCERIN 0.4 MG SL tablet  Commonly known as:  NITROSTAT  Place 1 tablet (0.4 mg total) under the tongue every 5 (five) minutes as needed for chest pain.     sertraline 50 MG tablet  Commonly known as:  ZOLOFT  Take 1 tablet (50 mg  total) by mouth daily.     triamcinolone cream 0.1 %  Commonly known as:  KENALOG  Apply 1 application topically 2 (two) times daily.        Disposition   The patient will be discharged in stable condition to home. Discharge Instructions    Amb Referral to Cardiac Rehabilitation    Complete by:  As directed   Congestive Heart Failure: If diagnosis is Heart Failure, patient MUST meet each of the CMS criteria: 1. Left Ventricular Ejection Fraction </= 35% 2. NYHA class II-IV symptoms despite being on optimal heart failure therapy for at least 6 weeks. 3. Stable = have not had a recent (<6 weeks) or planned (<6 months) major cardiovascular hospitalization or procedure  Program Details: - Physician supervised classes - 1-3 classes per week over a 12-18 week period, generally for a total of 36 sessions  Physician Certification: I certify that the above Cardiac Rehabilitation treatment is medically necessary and is medically approved by me for treatment of this patient. The patient is willing and cooperative, able to ambulate and medically stable to participate in exercise rehabilitation. The participant's progress and Individualized Treatment Plan will be reviewed by the Medical Director, Cardiac Rehab staff and as indicated by the Referring/Ordering Physician.  Diagnosis:   Myocardial Infarction PCI            Follow-up Information    Follow up with Jenkins Rouge, MD.   Specialty:  Cardiology   Why:  The office will call you to make an appoinment., If you do not hear from them, please contact them., You should be seen within 1-2 weeks.   Contact information:   [address] [address] [phone]         Duration of Discharge Encounter: Greater than 30 minutes including physician and PA time.  Signed, Angelena Form R PA-C 04/25/2015, 5:10 PM   Thompson Grayer MD, [facility] 04/25/2015 5:58 PM

## 2015-04-27 ENCOUNTER — Encounter: Payer: Self-pay | Admitting: Nurse Practitioner

## 2015-04-27 NOTE — Telephone Encounter (Signed)
S/w pt made appointment for September 19, pt might have court will call back to confirm appointment

## 2015-04-27 NOTE — Telephone Encounter (Signed)
Pt called in to confim appointment for Monday, September 19. Norma Fredrickson, NP, s/w Dr.Cooper  And discussed pt can get note today for being  out of work 4-6 weeks but will have to resume work after that and cannot get disability. Will leave note up front for pt to pick up

## 2015-05-04 ENCOUNTER — Ambulatory Visit: Payer: Self-pay | Admitting: Nurse Practitioner

## 2015-05-20 ENCOUNTER — Ambulatory Visit (HOSPITAL_COMMUNITY)
Admission: RE | Admit: 2015-05-20 | Discharge: 2015-05-20 | Disposition: A | Discharge status: Home / Self Care | Payer: Self-pay | Source: Ambulatory Visit | Attending: Internal Medicine | Admitting: Internal Medicine

## 2015-05-20 ENCOUNTER — Other Ambulatory Visit: Payer: Self-pay | Admitting: Internal Medicine

## 2015-05-20 ENCOUNTER — Encounter: Payer: Self-pay | Admitting: Internal Medicine

## 2015-05-20 ENCOUNTER — Ambulatory Visit: Payer: Self-pay | Attending: Internal Medicine | Admitting: Internal Medicine

## 2015-05-20 VITALS — BP 151/87 | HR 72 | Temp 98.0°F | Resp 16 | Wt 261.6 lb

## 2015-05-20 DIAGNOSIS — E785 Hyperlipidemia, unspecified: Secondary | ICD-10-CM | POA: Insufficient documentation

## 2015-05-20 DIAGNOSIS — I252 Old myocardial infarction: Secondary | ICD-10-CM | POA: Insufficient documentation

## 2015-05-20 DIAGNOSIS — M79644 Pain in right finger(s): Secondary | ICD-10-CM | POA: Insufficient documentation

## 2015-05-20 DIAGNOSIS — M79643 Pain in unspecified hand: Secondary | ICD-10-CM | POA: Insufficient documentation

## 2015-05-20 DIAGNOSIS — T148XXA Other injury of unspecified body region, initial encounter: Secondary | ICD-10-CM

## 2015-05-20 DIAGNOSIS — T148 Other injury of unspecified body region: Secondary | ICD-10-CM

## 2015-05-20 DIAGNOSIS — W868XXA Exposure to other electric current, initial encounter: Secondary | ICD-10-CM | POA: Insufficient documentation

## 2015-05-20 DIAGNOSIS — T754XXA Electrocution, initial encounter: Secondary | ICD-10-CM

## 2015-05-20 DIAGNOSIS — M7989 Other specified soft tissue disorders: Secondary | ICD-10-CM | POA: Insufficient documentation

## 2015-05-20 DIAGNOSIS — I2510 Atherosclerotic heart disease of native coronary artery without angina pectoris: Secondary | ICD-10-CM

## 2015-05-20 DIAGNOSIS — F17210 Nicotine dependence, cigarettes, uncomplicated: Secondary | ICD-10-CM | POA: Insufficient documentation

## 2015-05-20 DIAGNOSIS — M79641 Pain in right hand: Secondary | ICD-10-CM | POA: Insufficient documentation

## 2015-05-20 DIAGNOSIS — I10 Essential (primary) hypertension: Secondary | ICD-10-CM | POA: Insufficient documentation

## 2015-05-20 DIAGNOSIS — Z8249 Family history of ischemic heart disease and other diseases of the circulatory system: Secondary | ICD-10-CM | POA: Insufficient documentation

## 2015-05-20 DIAGNOSIS — Z79899 Other long term (current) drug therapy: Secondary | ICD-10-CM | POA: Insufficient documentation

## 2015-05-20 DIAGNOSIS — K219 Gastro-esophageal reflux disease without esophagitis: Secondary | ICD-10-CM | POA: Insufficient documentation

## 2015-05-20 MED ORDER — GABAPENTIN 300 MG PO CAPS: 300.0000 mg | ORAL_CAPSULE | Freq: Two times a day (BID) | ORAL | Status: DC

## 2015-05-20 MED ORDER — ACETAMINOPHEN-CODEINE #3 300-30 MG PO TABS: 1.0000 | ORAL_TABLET | Freq: Three times a day (TID) | ORAL | Status: DC | PRN

## 2015-05-20 NOTE — Progress Notes (Signed)
Patient ID: Peter Russo, male   DOB: 12/17/1975, 39 y.o.   MRN: [id number]  CC: finger pain  HPI: Peter Russo is a 39 y.o. male here today for a follow up visit.  Patient has past medical history of SVT, GERD, HTN, MI, exertional angina, and HLD. Patient states about two weeks ago the lighter in his car was not working properly for his [employer] account planner. Patient stuck his finger into the lighter to try to pull out the broken charger he received a shock on the tip of his left pointer finger. Finger is now swollen and very painful. He states that the pain is achy and radiates up his right upper arm and shoulder.  Patient is unsure if he has been taking the correct medications. Has never started on Plavix. Believes he has only been taking Lisinopril for BP.    No Known Allergies Past Medical History  Diagnosis Date  . 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 GERD (gastroesophageal reflux disease)   . Hypertension   . Myocardial infarct (HCC)   . Hypercholesteremia   . Exertional angina (HCC) 04/23/2015   Current Outpatient Prescriptions on File Prior to Visit  Medication Sig Dispense Refill  . ALPRAZolam (XANAX) 0.5 MG tablet Take 1 tablet (0.5 mg total) by mouth 2 (two) times daily as needed for anxiety. 30 tablet 0  . aspirin EC 81 MG tablet Take 81 mg by mouth daily.    Marland Kitchen atorvastatin (LIPITOR) 80 MG tablet Take 1 tablet (80 mg total) by mouth daily at 6 PM. 30 tablet 11  . clopidogrel (PLAVIX) 75 MG tablet Take 1 tablet (75 mg total) by mouth daily with breakfast. 30 tablet 11  . lisinopril (PRINIVIL,ZESTRIL) 5 MG tablet Take 1 tablet (5 mg total) by mouth daily. 30 tablet 5  . metoprolol tartrate (LOPRESSOR) 25 MG tablet Take 1 tablet (25 mg total) by mouth 2 (two) times daily. 60 tablet 11  . nitroGLYCERIN (NITROSTAT) 0.4 MG SL tablet Place 1 tablet (0.4 mg total) under the tongue every 5 (five) minutes as  needed for chest pain. 25 tablet 3  . sertraline (ZOLOFT) 50 MG tablet Take 1 tablet (50 mg total) by mouth daily. 30 tablet 2  . triamcinolone cream (KENALOG) 0.1 % Apply 1 application topically 2 (two) times daily. (Patient taking differently: Apply 1 application topically 2 (two) times daily as needed (Rash on Arm). ) 30 g 0   No current facility-administered medications on file prior to visit.   Family History  Problem Relation Age of Onset  . Arrhythmia Mother     MOTHER LIVED TO BE 89  . Coronary artery disease Father 17  . Heart disease Father    Social History   Social History  . Marital Status: Single    Spouse Name: N/A  . Number of Children: N/A  . Years of Education: N/A   Occupational History  . ACCOUNTANT    Social History Main Topics  . Smoking status: Current Every Day Smoker -- 1.00 packs/day    Types: Cigarettes  . Smokeless tobacco: Not on file  . Alcohol Use: No  . Drug Use: Yes    Special: Marijuana  . Sexual Activity: Not on file   Other Topics Concern  . Not on file   Social History Narrative   REGULAR EXERCISE    Review of Systems: Other than what is stated in  HPI, all other systems are negative.   Objective:   Filed Vitals:   05/20/15 1458  BP: 151/87  Pulse: 72  Temp: 98 F (36.7 C)  Resp: 16    Physical Exam  Constitutional: He is oriented to person, place, and time.  Cardiovascular: Normal rate, regular rhythm and normal heart sounds.   Pulmonary/Chest: Effort normal and breath sounds normal.  Musculoskeletal: He exhibits tenderness.  Unable to bend right index finger, swelling Full right shoulder ROM  Neurological: He is alert and oriented to person, place, and time.  Skin: Skin is warm and dry.  Psychiatric: He has a normal mood and affect.     Lab Results  Component Value Date   WBC 12.8* 04/25/2015   HGB 14.5 04/25/2015   HCT 42.2 04/25/2015   MCV 86.5 04/25/2015   PLT 218 04/25/2015   Lab Results  Component  Value Date   CREATININE 1.06 04/25/2015   BUN 7 04/25/2015   NA 137 04/25/2015   K 3.7 04/25/2015   CL 102 04/25/2015   CO2 28 04/25/2015    Lab Results  Component Value Date   HGBA1C 6.2* 12/26/2014   Lipid Panel     Component Value Date/Time   CHOL 243* 04/17/2015 1616   TRIG 472* 04/17/2015 1616   HDL 28* 04/17/2015 1616   CHOLHDL 8.7* 04/17/2015 1616   VLDL NOT CALC 04/17/2015 1616   LDLCALC NOT CALC 04/17/2015 1616       Assessment and plan:   Peter Russo was seen today for hand pain.  Diagnoses and all orders for this visit:  Nerve damage -     gabapentin (NEURONTIN) 300 MG capsule; Take 1 capsule (300 mg total) by mouth 2 (two) times daily. For nerve pain -     acetaminophen-codeine (TYLENOL #3) 300-30 MG tablet; Take 1 tablet by mouth every 8 (eight) hours as needed for moderate pain. I believe patient has suffered nerve damage as a result of shock. Does not appear infected, will x-ray.   Electrocution Left hand xray ordered. Return precautions noted  CAD Went over patient's medications and explained the importance of Plavix for further MI prevention. Diet and exercise discussed.    Return in about 3 months (around 08/20/2015) for routine.       Ambrose Finland, NP-C [facility] [phone] 05/20/2015, 3:15 PM

## 2015-05-20 NOTE — Progress Notes (Signed)
Patient states about two weeks ago his lighter in his car was not working Properly for his [employer] account planner.  Patient stuck his finger into the lighter to try to pull Out the piece and got shocked to the tip of his right pointer finger Finger is now swollen and very painful and patient has minimal use of the finger

## 2015-05-22 ENCOUNTER — Telehealth: Payer: Self-pay

## 2015-05-22 NOTE — Telephone Encounter (Signed)
-----   Message from Ambrose Finland, NP sent at 05/20/2015  9:35 PM EDT ----- No fracture. Just swelling

## 2015-05-22 NOTE — Telephone Encounter (Signed)
Patient not available Left message on voice mail to return our call 

## 2015-05-22 NOTE — Telephone Encounter (Signed)
Patient called back, please f/u  °

## 2015-05-25 NOTE — Telephone Encounter (Signed)
Patient returned call in regards of his x-rays. Patient also stated his index finger on his right hand has gotten worse. please f/u with pt.

## 2015-05-26 ENCOUNTER — Emergency Department (HOSPITAL_COMMUNITY): Payer: Self-pay

## 2015-05-26 ENCOUNTER — Encounter (HOSPITAL_COMMUNITY): Payer: Self-pay | Admitting: Emergency Medicine

## 2015-05-26 ENCOUNTER — Inpatient Hospital Stay (HOSPITAL_COMMUNITY)
Admission: EM | Admit: 2015-05-26 | Discharge: 2015-05-29 | DRG: 603 | Disposition: A | Discharge status: Home / Self Care | Payer: Self-pay | Attending: Internal Medicine | Admitting: Internal Medicine

## 2015-05-26 DIAGNOSIS — I252 Old myocardial infarction: Secondary | ICD-10-CM

## 2015-05-26 DIAGNOSIS — F17200 Nicotine dependence, unspecified, uncomplicated: Secondary | ICD-10-CM | POA: Diagnosis present

## 2015-05-26 DIAGNOSIS — L03011 Cellulitis of right finger: Secondary | ICD-10-CM | POA: Diagnosis present

## 2015-05-26 DIAGNOSIS — Z955 Presence of coronary angioplasty implant and graft: Secondary | ICD-10-CM

## 2015-05-26 DIAGNOSIS — E785 Hyperlipidemia, unspecified: Secondary | ICD-10-CM | POA: Diagnosis present

## 2015-05-26 DIAGNOSIS — F411 Generalized anxiety disorder: Secondary | ICD-10-CM | POA: Diagnosis present

## 2015-05-26 DIAGNOSIS — Z79899 Other long term (current) drug therapy: Secondary | ICD-10-CM

## 2015-05-26 DIAGNOSIS — R7303 Prediabetes: Secondary | ICD-10-CM | POA: Diagnosis present

## 2015-05-26 DIAGNOSIS — I10 Essential (primary) hypertension: Secondary | ICD-10-CM | POA: Diagnosis present

## 2015-05-26 DIAGNOSIS — K219 Gastro-esophageal reflux disease without esophagitis: Secondary | ICD-10-CM | POA: Diagnosis present

## 2015-05-26 DIAGNOSIS — Z7902 Long term (current) use of antithrombotics/antiplatelets: Secondary | ICD-10-CM

## 2015-05-26 DIAGNOSIS — L03119 Cellulitis of unspecified part of limb: Secondary | ICD-10-CM | POA: Diagnosis present

## 2015-05-26 DIAGNOSIS — Z7982 Long term (current) use of aspirin: Secondary | ICD-10-CM

## 2015-05-26 DIAGNOSIS — I2510 Atherosclerotic heart disease of native coronary artery without angina pectoris: Secondary | ICD-10-CM | POA: Diagnosis present

## 2015-05-26 DIAGNOSIS — L03021 Acute lymphangitis of right finger: Principal | ICD-10-CM | POA: Diagnosis present

## 2015-05-26 DIAGNOSIS — L089 Local infection of the skin and subcutaneous tissue, unspecified: Secondary | ICD-10-CM

## 2015-05-26 LAB — BASIC METABOLIC PANEL
Anion gap: 12 (ref 5–15)
BUN: 6 mg/dL (ref 6–20)
CO2: 26 mmol/L (ref 22–32)
Calcium: 9 mg/dL (ref 8.9–10.3)
Chloride: 97 mmol/L — ABNORMAL LOW (ref 101–111)
Creatinine, Ser: 1.15 mg/dL (ref 0.61–1.24)
GFR calc Af Amer: >60 mL/min (ref >60)
GFR calc non Af Amer: >60 mL/min (ref >60)
Glucose, Bld: 143 mg/dL — ABNORMAL HIGH (ref 65–99)
Potassium: 3.7 mmol/L (ref 3.5–5.1)
Sodium: 135 mmol/L (ref 135–145)

## 2015-05-26 LAB — CBC WITH DIFFERENTIAL/PLATELET
Basophils Absolute: 0.1 10*3/uL (ref 0.0–0.1)
Basophils Relative: 1 %
Eosinophils Absolute: 0.5 10*3/uL (ref 0.0–0.7)
Eosinophils Relative: 4 %
HCT: 42.7 % (ref 39.0–52.0)
Hemoglobin: 14.8 g/dL (ref 13.0–17.0)
Lymphocytes Relative: 31 %
Lymphs Abs: 3.5 10*3/uL (ref 0.7–4.0)
MCH: 29.5 pg (ref 26.0–34.0)
MCHC: 34.7 g/dL (ref 30.0–36.0)
MCV: 85.2 fL (ref 78.0–100.0)
Monocytes Absolute: 0.7 10*3/uL (ref 0.1–1.0)
Monocytes Relative: 6 %
Neutro Abs: 6.5 10*3/uL (ref 1.7–7.7)
Neutrophils Relative %: 58 %
Platelets: 233 10*3/uL (ref 150–400)
RBC: 5.01 MIL/uL (ref 4.22–5.81)
RDW: 13.5 % (ref 11.5–15.5)
WBC: 11.3 10*3/uL — ABNORMAL HIGH (ref 4.0–10.5)

## 2015-05-26 MED ORDER — LIDOCAINE-EPINEPHRINE (PF) 2 %-1:200000 IJ SOLN
20.0000 mL | Freq: Once | INTRAMUSCULAR | Status: AC
  Administered 2015-05-26: 20 mL
  Filled 2015-05-26: qty 20

## 2015-05-26 MED ORDER — OXYCODONE-ACETAMINOPHEN 5-325 MG PO TABS
ORAL_TABLET | ORAL | Status: AC
  Filled 2015-05-26: qty 1

## 2015-05-26 MED ORDER — OXYCODONE-ACETAMINOPHEN 5-325 MG PO TABS
1.0000 | ORAL_TABLET | Freq: Once | ORAL | Status: AC
  Administered 2015-05-26: 1 via ORAL

## 2015-05-26 MED ORDER — CLINDAMYCIN PHOSPHATE 600 MG/50ML IV SOLN
600.0000 mg | Freq: Once | INTRAVENOUS | Status: AC
  Administered 2015-05-26: 600 mg via INTRAVENOUS
  Filled 2015-05-26: qty 50
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
## 2015-05-26 NOTE — ED Notes (Signed)
MD Thompson at bedside.  

## 2015-05-27 ENCOUNTER — Ambulatory Visit: Payer: Self-pay | Admitting: Nurse Practitioner

## 2015-05-27 ENCOUNTER — Encounter (HOSPITAL_COMMUNITY): Payer: Self-pay | Admitting: Family Medicine

## 2015-05-27 DIAGNOSIS — R7303 Prediabetes: Secondary | ICD-10-CM

## 2015-05-27 DIAGNOSIS — I2510 Atherosclerotic heart disease of native coronary artery without angina pectoris: Secondary | ICD-10-CM

## 2015-05-27 DIAGNOSIS — L03119 Cellulitis of unspecified part of limb: Secondary | ICD-10-CM | POA: Diagnosis present

## 2015-05-27 DIAGNOSIS — F411 Generalized anxiety disorder: Secondary | ICD-10-CM

## 2015-05-27 DIAGNOSIS — L089 Local infection of the skin and subcutaneous tissue, unspecified: Secondary | ICD-10-CM

## 2015-05-27 DIAGNOSIS — Z720 Tobacco use: Secondary | ICD-10-CM

## 2015-05-27 MED ORDER — MORPHINE SULFATE (PF) 2 MG/ML IV SOLN
2.0000 mg | Freq: Once | INTRAVENOUS | Status: AC
  Administered 2015-05-27: 2 mg via INTRAVENOUS

## 2015-05-27 MED ORDER — MORPHINE SULFATE (PF) 4 MG/ML IV SOLN
4.0000 mg | Freq: Once | INTRAVENOUS | Status: AC
  Administered 2015-05-27: 4 mg via INTRAVENOUS
  Filled 2015-05-27: qty 1

## 2015-05-27 MED ORDER — CLINDAMYCIN PHOSPHATE 600 MG/50ML IV SOLN
600.0000 mg | Freq: Three times a day (TID) | INTRAVENOUS | Status: DC
  Administered 2015-05-27 – 2015-05-29 (×7): 600 mg via INTRAVENOUS
  Filled 2015-05-27 (×11): qty 50

## 2015-05-27 MED ORDER — CLOPIDOGREL BISULFATE 75 MG PO TABS
75.0000 mg | ORAL_TABLET | Freq: Every day | ORAL | Status: DC
Start: 2015-05-27 — End: 2015-05-29
  Administered 2015-05-27 – 2015-05-29 (×3): 75 mg via ORAL
  Filled 2015-05-27 (×3): qty 1

## 2015-05-27 MED ORDER — ACETAMINOPHEN 325 MG PO TABS: 650.0000 mg | ORAL_TABLET | Freq: Four times a day (QID) | ORAL | Status: DC | PRN

## 2015-05-27 MED ORDER — SERTRALINE HCL 50 MG PO TABS
50.0000 mg | ORAL_TABLET | Freq: Every day | ORAL | Status: DC
  Administered 2015-05-27 – 2015-05-29 (×3): 50 mg via ORAL
  Filled 2015-05-27 (×3): qty 1

## 2015-05-27 MED ORDER — ACETAMINOPHEN 650 MG RE SUPP: 650.0000 mg | Freq: Four times a day (QID) | RECTAL | Status: DC | PRN

## 2015-05-27 MED ORDER — OXYCODONE HCL 5 MG PO TABS
5.0000 mg | ORAL_TABLET | ORAL | Status: DC | PRN
  Administered 2015-05-27 – 2015-05-28 (×3): 5 mg via ORAL
  Filled 2015-05-27 (×3): qty 1

## 2015-05-27 MED ORDER — ATORVASTATIN CALCIUM 80 MG PO TABS
80.0000 mg | ORAL_TABLET | Freq: Every day | ORAL | Status: DC
  Administered 2015-05-27 – 2015-05-28 (×2): 80 mg via ORAL
  Filled 2015-05-27 (×2): qty 1

## 2015-05-27 MED ORDER — MORPHINE SULFATE (PF) 2 MG/ML IV SOLN
INTRAVENOUS | Status: AC
  Filled 2015-05-27: qty 1

## 2015-05-27 MED ORDER — HYDROMORPHONE HCL 1 MG/ML IJ SOLN
1.0000 mg | INTRAMUSCULAR | Status: DC | PRN
  Administered 2015-05-27 – 2015-05-29 (×10): 1 mg via INTRAVENOUS
  Filled 2015-05-27 (×10): qty 1

## 2015-05-27 MED ORDER — ASPIRIN EC 81 MG PO TBEC
81.0000 mg | DELAYED_RELEASE_TABLET | Freq: Every day | ORAL | Status: DC
  Administered 2015-05-27 – 2015-05-29 (×3): 81 mg via ORAL
  Filled 2015-05-27 (×3): qty 1

## 2015-05-27 MED ORDER — LISINOPRIL 5 MG PO TABS
5.0000 mg | ORAL_TABLET | Freq: Every day | ORAL | Status: DC
  Administered 2015-05-27 – 2015-05-29 (×3): 5 mg via ORAL
  Filled 2015-05-27 (×3): qty 1

## 2015-05-27 MED ORDER — METOPROLOL TARTRATE 25 MG PO TABS
25.0000 mg | ORAL_TABLET | Freq: Two times a day (BID) | ORAL | Status: DC
  Administered 2015-05-27 – 2015-05-29 (×5): 25 mg via ORAL
  Filled 2015-05-27 (×5): qty 1

## 2015-05-27 MED ORDER — ENOXAPARIN SODIUM 40 MG/0.4ML ~~LOC~~ SOLN
40.0000 mg | SUBCUTANEOUS | Status: DC
  Administered 2015-05-27 – 2015-05-29 (×3): 40 mg via SUBCUTANEOUS
  Filled 2015-05-27 (×3): qty 0.4

## 2015-05-27 NOTE — Progress Notes (Signed)
Patient admitted after midnight. Chart reviewed. Patient examined. Continue antibiotics. cx pending.  Crista Curborinna Quinten Allerton, MD [facility]
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
## 2015-05-27 NOTE — Care Management Note (Signed)
Case Management Note  Patient Details  Name: Peter Russo MRN: [id number] Date of Birth: 08-22-1975  Subjective/Objective:                    Action/Plan:   Expected Discharge Date:  05/29/15               Expected Discharge Plan:  Home/Self Care  In-House Referral:     Discharge planning Services     Post Acute Care Choice:    Choice offered to:     DME Arranged:    DME Agency:     HH Arranged:    HH Agency:     Status of Service:  In process, will continue to follow  Medicare Important Message Given:    Date Medicare IM Given:    Medicare IM give by:    Date Additional Medicare IM Given:    Additional Medicare Important Message give by:     If discussed at Long Length of Stay Meetings, dates discussed:    Additional Comments:  Peter Russo, Peter Ruble Marie, RN 05/27/2015, 2:10 PM

## 2015-05-28 MED ORDER — HYDROCERIN EX CREA
TOPICAL_CREAM | Freq: Every day | CUTANEOUS | Status: DC
  Administered 2015-05-28 – 2015-05-29 (×2): via TOPICAL
  Filled 2015-05-28: qty 113

## 2015-05-28 MED ORDER — BACITRACIN ZINC 500 UNIT/GM EX OINT
TOPICAL_OINTMENT | Freq: Two times a day (BID) | CUTANEOUS | Status: DC
  Administered 2015-05-28 – 2015-05-29 (×3): via TOPICAL
  Filled 2015-05-28: qty 28.35

## 2015-05-28 NOTE — Progress Notes (Signed)
Finger expectedly painful, held nearly fully extended Packings pulled, no expressible pus.  1. I ordered OT to begin ROM exercises and also ordered outpatient OT to follow upon d/c from hospital 2. I also ordered for appropriate wound care to begin and for patient to learn it from nursing staff. 3. I will plan to have him back to the office to follow-up on my portions of his predicament (wound healing and motion)  4. Infection care/resolution per primary team (antibiotic type, route, duration, etc.).  The transition of his infection care to an appropriate outpatient physician will need to be arranged prior to his d/c from hospital.  Typically, this requires a f/u visit within a few days of d/c to be sure that the infection continues to resolve on the treatment plan as put into place in the hospital.  Please call if any additional hand surgical questions arise.  Peter Crouchave Jaline Pincock, MD Hand Surgery Mobile [phone]
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
## 2015-05-29 DIAGNOSIS — L03021 Acute lymphangitis of right finger: Principal | ICD-10-CM

## 2015-05-29 DIAGNOSIS — I10 Essential (primary) hypertension: Secondary | ICD-10-CM

## 2015-05-29 LAB — CULTURE, ROUTINE-ABSCESS

## 2015-05-29 MED ORDER — BACITRACIN ZINC 500 UNIT/GM EX OINT
TOPICAL_OINTMENT | Freq: Two times a day (BID) | CUTANEOUS | Status: DC
Start: 2015-05-29 — End: 2015-09-11

## 2015-05-29 MED ORDER — DOXYCYCLINE HYCLATE 100 MG PO TABS: 100.0000 mg | ORAL_TABLET | Freq: Two times a day (BID) | ORAL | Status: DC

## 2015-05-29 MED ORDER — CEPHALEXIN 500 MG PO CAPS
500.0000 mg | ORAL_CAPSULE | Freq: Four times a day (QID) | ORAL | Status: DC
Start: 2015-05-29 — End: 2015-07-06
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
## 2015-05-29 NOTE — Progress Notes (Signed)
Discussed discharge summary with patient. Reviewed all medications with patient. Patient ready for discharge.  

## 2015-05-29 NOTE — Discharge Instructions (Addendum)
DO YOUR WOUND CARE AND MOTION EXERCISES AS SHOWN IN THE HOSPITAL.   REFER ALL QUESTIONS RELATED TO YOUR WOUND OR YOUR FINGER MOTION TO DR. THOMPSON REFER ALL QUESTIONS RELATED TO YOUR INFECTION/ANTIBIOTICS, ETC TO YOUR DESIGNATED INFECTION DOCTOR.    Fingertip Infection When an infection is around the nail, it is called a paronychia. When it appears over the tip of the finger, it is called a felon. These infections are due to minor injuries or cracks in the skin. If they are not treated properly, they can lead to bone infection and permanent damage to the fingernail. Incision and drainage is necessary if a pus pocket (an abscess) has formed. Antibiotics and pain medicine may also be needed. Keep your hand elevated for the next 2-3 days to reduce swelling and pain. If a pack was placed in the abscess, it should be removed in 1-2 days by your caregiver. Soak the finger in warm water for 20 minutes 4 times daily to help promote drainage. Keep the hands as dry as possible. Wear protective gloves with cotton liners. See your caregiver for follow-up care as recommended.  HOME CARE INSTRUCTIONS   Keep wound clean, dry and dressed as suggested by your caregiver.  Soak in warm salt water for fifteen minutes, four times per day for bacterial infections.  Your caregiver will prescribe an antibiotic if a bacterial infection is suspected. Take antibiotics as directed and finish the prescription, even if the problem appears to be improving before the medicine is gone.  Only take over-the-counter or prescription medicines for pain, discomfort, or fever as directed by your caregiver. SEEK IMMEDIATE MEDICAL CARE IF:  There is redness, swelling, or increasing pain in the wound.  Pus or any other unusual drainage is coming from the wound.  An unexplained oral temperature above 102 F (38.9 C) develops.  You notice a foul smell coming from the wound or dressing. MAKE SURE YOU:   Understand these  instructions.  Monitor your condition.  Contact your caregiver if you are getting worse or not improving.   This information is not intended to replace advice given to you by your health care provider. Make sure you discuss any questions you have with your health care provider.   Document Released: 09/08/2004 Document Revised: 10/24/2011 Document Reviewed: 01/19/2015 Elsevier Interactive Patient Education 2016 [employer] Inc[employer]   Abscess An abscess is an infected area that contains a collection of pus and debris.It can occur in almost any part of the body. An abscess is also known as a furuncle or boil. CAUSES  An abscess occurs when tissue gets infected. This can occur from blockage of oil or sweat glands, infection of hair follicles, or a minor injury to the skin. As the body tries to fight the infection, pus collects in the area and creates pressure under the skin. This pressure causes pain. People with weakened immune systems have difficulty fighting infections and get certain abscesses more often.  SYMPTOMS Usually an abscess develops on the skin and becomes a painful mass that is red, warm, and tender. If the abscess forms under the skin, you may feel a moveable soft area under the skin. Some abscesses break open (rupture) on their own, but most will continue to get worse without care. The infection can spread deeper into the body and eventually into the bloodstream, causing you to feel ill.  DIAGNOSIS  Your caregiver will take your medical history and perform a physical exam. A sample of fluid may also be taken  from the abscess to determine what is causing your infection. TREATMENT  Your caregiver may prescribe antibiotic medicines to fight the infection. However, taking antibiotics alone usually does not cure an abscess. Your caregiver may need to make a small cut (incision) in the abscess to drain the pus. In some cases, gauze is packed into the abscess to reduce pain and to continue  draining the area. HOME CARE INSTRUCTIONS   Only take over-the-counter or prescription medicines for pain, discomfort, or fever as directed by your caregiver.  If you were prescribed antibiotics, take them as directed. Finish them even if you start to feel better.  If gauze is used, follow your caregiver's directions for changing the gauze.  To avoid spreading the infection:  Keep your draining abscess covered with a bandage.  Wash your hands well.  Do not share personal care items, towels, or whirlpools with others.  Avoid skin contact with others.  Keep your skin and clothes clean around the abscess.  Keep all follow-up appointments as directed by your caregiver. SEEK MEDICAL CARE IF:   You have increased pain, swelling, redness, fluid drainage, or bleeding.  You have muscle aches, chills, or a general ill feeling.  You have a fever. MAKE SURE YOU:   Understand these instructions.  Will watch your condition.  Will get help right away if you are not doing well or get worse.   This information is not intended to replace advice given to you by your health care provider. Make sure you discuss any questions you have with your health care provider.   Document Released: 05/11/2005 Document Revised: 01/31/2012 Document Reviewed: 10/14/2011 Elsevier Interactive Patient Education 2016 [employer].   Pain Medicine Instructions  HOW CAN PAIN MEDICINE AFFECT ME?    You were given a prescription for pain medicine. This medicine may make you tired or drowsy and may affect your ability to think clearly. Pain medicine may also affect your ability to drive or perform certain physical activities. It may not be possible to make all of your pain go away, but you should be comfortable enough to move, breathe, and take care of yourself.  HOW OFTEN SHOULD I TAKE PAIN MEDICINE AND HOW MUCH SHOULD I TAKE?  Take pain medicine only as directed by your health care provider and only as needed  for pain.  You do not need to take pain medicine if you are not having pain, unless directed by your health care provider.  You can take less than the prescribed dose if you find that a smaller amount of medicine controls your pain. WHAT RESTRICTIONS DO I HAVE WHILE TAKING PAIN MEDICINE?  Follow these instructions after you start taking pain medicine, while you are taking the medicine, and for 8 hours after you stop taking the medicine:  Do not drive.  Do not operate machinery.  Do not operate power tools.  Do not sign legal documents.  Do not drink alcohol.  Do not take sleeping pills.  Do not supervise children by yourself.  Do not participate in activities that require climbing or being in high places.  Do not enter a body of water--such as a lake, river, ocean, spa, or swimming pool--without an adult nearby who can monitor and help you. HOW CAN I KEEP OTHERS SAFE WHILE I AM TAKING PAIN MEDICINE?  Store your pain medicine as directed by your health care provider. Make sure that it is placed where children and pets cannot reach it.  Never share your pain  medicine with anyone.  Do not save any leftover pills. If you have any leftover pain medicine, get rid of it or destroy it as directed by your health care provider. WHAT ELSE DO I NEED TO KNOW ABOUT TAKING PAIN MEDICINE?  Use a stool softener if you become constipated from your pain medicine. Increasing your intake of fruits and vegetables will also help with constipation.  Write down the times when you take your pain medicine. Look at the times before you take your next dose of medicine. It is easy to become confused while on pain medicine. Recording the times helps you to avoid an overdose.  If your pain is severe, do not try to treat it yourself by taking more pills than instructed on your prescription. Contact your health care provider for help.  You may have been prescribed a pain medicine that contains acetaminophen. Do not take any  other acetaminophen while taking this medicine. An overdose of acetaminophen can result in severe liver damage. Acetaminophen is found in many over-the-counter (OTC) and prescription medicines. If you are taking any medicines in addition to your pain medicine, check the active ingredients on those medicines to see if acetaminophen is listed. WHEN SHOULD I CALL MY HEALTH CARE PROVIDER?  Your medicine is not helping to make the pain go away.  You vomit or have diarrhea shortly after taking the medicine.  You develop new pain in areas that did not hurt before.  You have an allergic reaction to your medicine. This may include:  Itchiness.  Swelling.  Dizziness.  Developing a new rash. WHEN SHOULD I CALL 911 OR GO TO THE EMERGENCY ROOM?  You feel dizzy or you faint.  You are very confused or disoriented.  You repeatedly vomit.  Your skin or lips turn pale or bluish in color.  You have shortness of breath or you are breathing much more slowly than usual.  You have a severe allergic reaction to your medicine. This includes:  Developing tongue swelling.  Having difficulty breathing. This information is not intended to replace advice given to you by your health care provider. Make sure you discuss any questions you have with your health care provider.  Document Released: 11/07/2000 Document Revised: 12/16/2014 Document Reviewed: 06/05/2014  Elsevier Interactive Patient Education [employer]

## 2015-05-29 NOTE — Care Management Note (Signed)
Case Management Note  Patient Details  Name: Peter Russo MRN: [id number] Date of Birth: February 14, 1976  Subjective/Objective:  Pt admitted on 05/26/15 with felon of Rt index finger with lymphangitis.  PTA, pt independent of ADLS.  OT recommending OP therapy at dc.                Action/Plan: Met with pt to discuss arranging OP services.  Pt declines OP OT at this time; states " I am in enough debt as it is."   Pt has no insurance.  Will qualify for [facility] letter at dc to assist with medication costs.    Expected Discharge Date:  05/29/15               Expected Discharge Plan:  Home/Self Care  In-House Referral:     Discharge planning Services  CM Consult  Post Acute Care Choice:    Choice offered to:     DME Arranged:    DME Agency:     HH Arranged:    HH Agency:     Status of Service:  Completed, signed off  Medicare Important Message Given:    Date Medicare IM Given:    Medicare IM give by:    Date Additional Medicare IM Given:    Additional Medicare Important Message give by:     If discussed at [place] of Stay Meetings, dates discussed:    Additional Comments:  Reinaldo Raddle, RN, BSN  Trauma/Neuro ICU Case Manager [phone]

## 2015-06-05 ENCOUNTER — Inpatient Hospital Stay: Payer: Self-pay | Admitting: Internal Medicine

## 2015-06-15 ENCOUNTER — Ambulatory Visit: Payer: Self-pay | Admitting: Internal Medicine

## 2015-06-16 ENCOUNTER — Telehealth: Payer: Self-pay | Admitting: Internal Medicine

## 2015-06-16 NOTE — Telephone Encounter (Signed)
Patient called stating that he needs a letter for [employer] stating that he is not able to work because he has problems with his right hand. Patient would also like a call back. Please f/u

## 2015-06-17 ENCOUNTER — Ambulatory Visit: Payer: Self-pay | Admitting: Physician Assistant

## 2015-06-18 NOTE — Telephone Encounter (Signed)
Patient called stating that he needs a letter for [employer] stating that he is not able to work because he has problems with his right hand. Please f/u

## 2015-06-22 NOTE — Telephone Encounter (Signed)
Patient calling once more about this matter. Please contact patient at the earliest convenience. Patient informs me that he will ask for a supervisor if correspondence is not received. Thank you, Peter Russo, ASA

## 2015-06-23 ENCOUNTER — Telehealth: Payer: Self-pay

## 2015-06-23 NOTE — Telephone Encounter (Signed)
Returned patient phone call Patient is requesting documentation that he can not work since he had a procedure Done to his finger Is this something you can write or does he need to get it from the Dr who did the procedure i told him it needs to come from the specialist but he insists you have to write it  Please advise

## 2015-06-24 NOTE — Telephone Encounter (Signed)
Needs to come from specialist. I do not hand those issues and I have not cared for his finger

## 2015-06-26 ENCOUNTER — Telehealth: Payer: Self-pay

## 2015-06-26 NOTE — Telephone Encounter (Signed)
Returned call to patient Per primary doctor he would have to get work recommendations  From the  Specialist that he saw for his hand Patient was requesting that we write a note stating that he can not work Patient was informed that office can write the recommendations

## 2015-07-05 ENCOUNTER — Encounter: Payer: Self-pay | Admitting: Physician Assistant

## 2015-07-05 DIAGNOSIS — R7303 Prediabetes: Secondary | ICD-10-CM | POA: Insufficient documentation

## 2015-07-05 DIAGNOSIS — I2510 Atherosclerotic heart disease of native coronary artery without angina pectoris: Secondary | ICD-10-CM | POA: Insufficient documentation

## 2015-07-05 DIAGNOSIS — Z720 Tobacco use: Secondary | ICD-10-CM | POA: Insufficient documentation

## 2015-07-05 DIAGNOSIS — I10 Essential (primary) hypertension: Secondary | ICD-10-CM | POA: Insufficient documentation

## 2015-07-05 DIAGNOSIS — Z9861 Coronary angioplasty status: Secondary | ICD-10-CM

## 2015-07-05 NOTE — Progress Notes (Signed)
Cardiology Office Note Date:  07/06/2015  Patient ID:  Peter Russo, DOB 09/29/1975, MRN [id number] PCP:  Ambrose FinlandValerie A Keck, NP  Cardiologist: Dr. Eden EmmsNishan  Chief Complaint: f/u CAD  History of Present Illness: Peter Russo is a 39 y.o. male with history of CAD (s/p BMS-mLAD 2010, DES to RCA 2012, and 04/2015 - DES to LCx and PCI to LAD c/b dissection with subsequent overlapping DES to mLAD - r/i for NSTEMI afterwards), tobacco abuse, HTN, GAD, prediabetes, HLD, GERD, remote SVT, obesity, depression who presents for follow-up. Per chart he has a history of noncompliance with his medications but had back on them for a few weeks when he saw Norma FredricksonLori Gerhardt in the office in August with multiple complaints. Some of his symptoms were concerning for [place] thus outpatient cath was arranged. He underwent this on 04/23/15 with subsequent PCI of the left circumflex with drug-eluting stent and PCI of LAD complicated by dissection with overlapping stents placed. He ruled in afterwards for NSTEMI (not unexpected given procedural complexity and transient vessel occlusion). DAPT with ASA/Plavix for at least 12 months was recommended. He was hospitalized in 05/2015 for a felon (finger infection).  He comes in today for f/u. Overall he feels he's doing well. He did have an episode of chest discomfort last week that occurred while driving. He was very hungry so he stopped to get a double Whopper. Shortly after eating it he noticed a sensation of indigestion. He did not feel any other symptoms including SOB, diaphoresis or dizziness. The discomfort felt like heartburn - did not feel like prior angina. He took Zantac without immediate relief so went on to take 2 SL NTG with eventual relief. He has not had any recurrence. He says he's able to trot up stairs very quickly without any chest pain or dyspnea. He is in the process of applying for Medicaid. He reports general medication compliance except does admit he occasionally  misses doses.    Past Medical History  Diagnosis Date  . 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 GERD (gastroesophageal reflux disease)   . Essential hypertension   . Hypercholesteremia   . CAD S/P percutaneous coronary angioplasty     a. s/p BMS-mLAD 2010. b. DES to RCA 2012. c. 04/2015: DES to LCx and PCI to LAD c/b dissection with subsequent overlapping DES to mLAD - r/i for NSTEMI afterwards.  . Tobacco abuse   . GAD (generalized anxiety disorder)   . Depression   . Pre-diabetes   . Morbid obesity [facility](HCC)     Past Surgical History  Procedure Laterality Date  . Coronary stent placement    . Cardiac catheterization N/A 04/23/2015    Procedure: Left Heart Cath and Coronary Angiography;  Surgeon: Wendall StadePeter C Nishan, MD;  [facility];  Service: Cardiovascular;  Laterality: N/A;  . Cardiac catheterization N/A 04/23/2015    Procedure: Coronary Stent Intervention;  Surgeon: Tonny BollmanMichael Cooper, MD;  [facility];  Service: Cardiovascular;  Laterality: N/A;    Current Outpatient Prescriptions  Medication Sig Dispense Refill  . ALPRAZolam (XANAX) 0.5 MG tablet Take 1 tablet (0.5 mg total) by mouth 2 (two) times daily as needed for anxiety. 30 tablet 0  . aspirin EC 81 MG tablet Take 81 mg by mouth daily.    Marland Kitchen. atorvastatin (LIPITOR) 80 MG tablet Take 1 tablet (80 mg total) by mouth daily  at 6 PM. 30 tablet 11  . bacitracin ointment Apply topically 2 (two) times daily. Apply to right index finger open distal wound per guidance. 120 g 0  . clopidogrel (PLAVIX) 75 MG tablet Take 1 tablet (75 mg total) by mouth daily with breakfast. 30 tablet 11  . lisinopril (PRINIVIL,ZESTRIL) 5 MG tablet Take 1 tablet (5 mg total) by mouth daily. 30 tablet 5  . metoprolol tartrate (LOPRESSOR) 25 MG tablet Take 1 tablet (25 mg total) by mouth 2 (two) times daily. 60 tablet 11  . nitroGLYCERIN (NITROSTAT) 0.4 MG SL  tablet Place 1 tablet (0.4 mg total) under the tongue every 5 (five) minutes as needed for chest pain. 25 tablet 3  . sertraline (ZOLOFT) 50 MG tablet Take 1 tablet (50 mg total) by mouth daily. 30 tablet 2   No current facility-administered medications for this visit.    Allergies:   Review of patient's allergies indicates no known allergies.   Social History:  The patient  reports that he has been smoking Cigarettes.  He has been smoking about 1.00 pack per day. He does not have any smokeless tobacco history on file. He reports that he uses illicit drugs (Marijuana). He reports that he does not drink alcohol.   Family History:  The patient's family history includes Arrhythmia in his mother; Coronary artery disease in his mother; Coronary artery disease (age of onset: 79) in his father; Heart attack in his brother, father, and mother; Heart disease in his father; Hypertension in his mother. There is no history of Stroke.  ROS:  Please see the history of present illness. + spotty rectal bleeding after he completed Keflex, no longer present. All other systems are reviewed and otherwise negative.   PHYSICAL EXAM:  VS:  BP 120/60 mmHg  Pulse 71  Ht  (1.702 m)  Wt 263 lb (119.296 kg)  BMI 41.18 kg/m2 BMI: Body mass index is 41.18 kg/(m^2). Well developed obese AAM in no acute distress HEENT: normocephalic, atraumatic Neck: no JVD, carotid bruits or masses Cardiac:  normal S1, S2; RRR; no murmurs, rubs, or gallops Lungs:  clear to auscultation bilaterally, no wheezing, rhonchi or rales Abd: soft, nontender, no hepatomegaly, + BS MS: no deformity or atrophy Ext: no edema, right radial cath site without hematoma or ecchymosis; good pulse Skin: warm and dry, no rash Neuro:  moves all extremities spontaneously, no focal abnormalities noted, follows commands Psych: euthymic mood, full affect   EKG:  Done today shows NSR 71bpm, possible prior anterior infarct, TWI avL, no acute  changes.  Recent Labs: 12/26/2014: TSH 0.534 04/17/2015: ALT 22 05/26/2015: BUN 6; Creatinine, Ser 1.15; Hemoglobin 14.8; Platelets 233; Potassium 3.7; Sodium 135  04/17/2015: Cholesterol 243*; HDL 28*; LDL Cholesterol NOT CALC; Total CHOL/HDL Ratio 8.7*; Triglycerides 472*; VLDL NOT CALC   CrCl cannot be calculated (Patient has no serum creatinine result on file.).   Wt Readings from Last 3 Encounters:  07/06/15 263 lb (119.296 kg)  05/27/15 257 lb 8 oz (116.8 kg)  05/20/15 261 lb 9.6 oz (118.661 kg)     Other studies reviewed: Additional studies/records reviewed today include: summarized above  ASSESSMENT AND PLAN:  1. CAD with recent NSTEMI as above - overall doing well. He had episode of CP last week. Difficult to know for sure but this may have been indigestion after eating a hamburger. It felt different than prior angina. He has not had any exertional symptoms since this time. EKG is generally unchanged and he  has not had recurrent symptoms. At this time it's reasonable to continue medical therapy and observe for recurrence. If he has recurrent chest discomfort he will notify our office (warning sx reviewed). Continue DAPT, BB, statin. Importance of med compliance reinforced. He declined cardiac rehab 2/2 no insurance. 2. Essential HTN - controlled. Continue present regimen. 3. HLD - statin increased in September. He reports he's fasting today. Check CMET, lipids. 4. Morbid obesity - counseled on the importance of remaining active. 5. Tobacco abuse - counseled regarding cessation. Despite advising of risks of worsening CAD, risk fo stroke/PAD/etc, I am not certain he's ready to quit.  Disposition: F/u with Dr. Eden Emms in 4 months. He also checked off that he had spotty rectal bleeding in October at the end of his abx course for his finger, but that resolved - I advised he d/w PCP.  Current medicines are reviewed at length with the patient today.  The patient did not have any concerns  regarding medicines.  Thomasene Mohair PA-C 07/06/2015 10:58 AM     [facility] [address] [place] [address] [phone] (office)  [phone] (fax)

## 2015-07-06 ENCOUNTER — Ambulatory Visit: Payer: Self-pay | Attending: Internal Medicine | Admitting: Internal Medicine

## 2015-07-06 ENCOUNTER — Ambulatory Visit (INDEPENDENT_AMBULATORY_CARE_PROVIDER_SITE_OTHER): Payer: Self-pay | Admitting: Physician Assistant

## 2015-07-06 ENCOUNTER — Encounter: Payer: Self-pay | Admitting: Internal Medicine

## 2015-07-06 ENCOUNTER — Encounter: Payer: Self-pay | Admitting: Physician Assistant

## 2015-07-06 VITALS — BP 120/60 | HR 71 | Ht 67.0 in | Wt 263.0 lb

## 2015-07-06 VITALS — BP 163/84 | HR 80 | Temp 98.0°F | Resp 16 | Ht 67.0 in | Wt 264.0 lb

## 2015-07-06 DIAGNOSIS — Z79899 Other long term (current) drug therapy: Secondary | ICD-10-CM | POA: Insufficient documentation

## 2015-07-06 DIAGNOSIS — Z9861 Coronary angioplasty status: Secondary | ICD-10-CM

## 2015-07-06 DIAGNOSIS — E782 Mixed hyperlipidemia: Secondary | ICD-10-CM

## 2015-07-06 DIAGNOSIS — Z955 Presence of coronary angioplasty implant and graft: Secondary | ICD-10-CM | POA: Insufficient documentation

## 2015-07-06 DIAGNOSIS — I2510 Atherosclerotic heart disease of native coronary artery without angina pectoris: Secondary | ICD-10-CM

## 2015-07-06 DIAGNOSIS — F411 Generalized anxiety disorder: Secondary | ICD-10-CM | POA: Insufficient documentation

## 2015-07-06 DIAGNOSIS — Z720 Tobacco use: Secondary | ICD-10-CM

## 2015-07-06 DIAGNOSIS — F17200 Nicotine dependence, unspecified, uncomplicated: Secondary | ICD-10-CM | POA: Insufficient documentation

## 2015-07-06 DIAGNOSIS — I10 Essential (primary) hypertension: Secondary | ICD-10-CM | POA: Insufficient documentation

## 2015-07-06 DIAGNOSIS — F329 Major depressive disorder, single episode, unspecified: Secondary | ICD-10-CM | POA: Insufficient documentation

## 2015-07-06 DIAGNOSIS — E7800 Pure hypercholesterolemia, unspecified: Secondary | ICD-10-CM | POA: Insufficient documentation

## 2015-07-06 DIAGNOSIS — Z7902 Long term (current) use of antithrombotics/antiplatelets: Secondary | ICD-10-CM | POA: Insufficient documentation

## 2015-07-06 DIAGNOSIS — I214 Non-ST elevation (NSTEMI) myocardial infarction: Secondary | ICD-10-CM

## 2015-07-06 DIAGNOSIS — Z7982 Long term (current) use of aspirin: Secondary | ICD-10-CM | POA: Insufficient documentation

## 2015-07-06 DIAGNOSIS — I252 Old myocardial infarction: Secondary | ICD-10-CM | POA: Insufficient documentation

## 2015-07-06 DIAGNOSIS — S6991XD Unspecified injury of right wrist, hand and finger(s), subsequent encounter: Secondary | ICD-10-CM

## 2015-07-06 DIAGNOSIS — R7303 Prediabetes: Secondary | ICD-10-CM | POA: Insufficient documentation

## 2015-07-06 DIAGNOSIS — T754XXD Electrocution, subsequent encounter: Secondary | ICD-10-CM | POA: Insufficient documentation

## 2015-07-06 LAB — LIPID PANEL
Cholesterol: 218 mg/dL — ABNORMAL HIGH (ref 125–200)
HDL: 25 mg/dL — ABNORMAL LOW (ref >=40)
Total CHOL/HDL Ratio: 8.7 Ratio — ABNORMAL HIGH (ref <=5.0)
Triglycerides: 521 mg/dL — ABNORMAL HIGH (ref <150)

## 2015-07-06 LAB — COMPREHENSIVE METABOLIC PANEL
ALT: 23 U/L (ref 9–46)
AST: 18 U/L (ref 10–40)
Albumin: 4 g/dL (ref 3.6–5.1)
Alkaline Phosphatase: 86 U/L (ref 40–115)
BUN: 10 mg/dL (ref 7–25)
CO2: 24 mmol/L (ref 20–31)
Calcium: 9 mg/dL (ref 8.6–10.3)
Chloride: 105 mmol/L (ref 98–110)
Creat: 1.02 mg/dL (ref 0.60–1.35)
Glucose, Bld: 97 mg/dL (ref 65–99)
Potassium: 4.1 mmol/L (ref 3.5–5.3)
Sodium: 139 mmol/L (ref 135–146)
Total Bilirubin: 0.3 mg/dL (ref 0.2–1.2)
Total Protein: 7.3 g/dL (ref 6.1–8.1)

## 2015-07-06 NOTE — Progress Notes (Signed)
Patient states he is here for a follow up Patient was seen in the ED in October for an infection to his right pointer finger

## 2015-07-06 NOTE — Progress Notes (Signed)
Patient ID: Peter Russo, male   DOB: 07/08/1976, 39 y.o.   MRN: [id number]  CC: ED follow up  HPI: Peter Russo is a 39 y.o. male here today for a follow up visit.  Patient has past medical history of SVT, CAD, MI, obesity, tobacco use. Patient was seen last month for a incidence where he electrocuted his right pointer finger. He was admitted into the hospital for felon of right finger with lymphangitis. He was given antibiotic therapy and told to follow up with his hand surgeon. He has since followed up with his hand surgeon and was told that his finger is healing and he needs to continue to move hand to regain full function. He states that during his healing he was out of work and lost his government assistance. He is requesting a note today so that he can get his benefits back. Patient reports that although he has had 2 MI's he continues to smoke and eat a diet high in fat. He plans to alter his he experienced chest pain last week after eating a cheeseburger.   Patient has No headache, No chest pain, No abdominal pain - No Nausea, No new weakness tingling or numbness, No Cough - SOB.  No Known Allergies Past Medical History  Diagnosis Date  . 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 GERD (gastroesophageal reflux disease)   . Essential hypertension   . Hypercholesteremia   . CAD S/P percutaneous coronary angioplasty     a. s/p BMS-mLAD 2010. b. DES to RCA 2012. c. 04/2015: DES to LCx and PCI to LAD c/b dissection with subsequent overlapping DES to mLAD - r/i for NSTEMI afterwards.  . Tobacco abuse   . GAD (generalized anxiety disorder)   . Depression   . Pre-diabetes   . Morbid obesity (HCC)    Current Outpatient Prescriptions on File Prior to Visit  Medication Sig Dispense Refill  . ALPRAZolam (XANAX) 0.5 MG tablet Take 1 tablet (0.5 mg total) by mouth 2 (two) times daily as needed for anxiety. 30 tablet 0   . aspirin EC 81 MG tablet Take 81 mg by mouth daily.    Marland Kitchen atorvastatin (LIPITOR) 80 MG tablet Take 1 tablet (80 mg total) by mouth daily at 6 PM. 30 tablet 11  . clopidogrel (PLAVIX) 75 MG tablet Take 1 tablet (75 mg total) by mouth daily with breakfast. 30 tablet 11  . lisinopril (PRINIVIL,ZESTRIL) 5 MG tablet Take 1 tablet (5 mg total) by mouth daily. 30 tablet 5  . metoprolol tartrate (LOPRESSOR) 25 MG tablet Take 1 tablet (25 mg total) by mouth 2 (two) times daily. 60 tablet 11  . nitroGLYCERIN (NITROSTAT) 0.4 MG SL tablet Place 1 tablet (0.4 mg total) under the tongue every 5 (five) minutes as needed for chest pain. 25 tablet 3  . sertraline (ZOLOFT) 50 MG tablet Take 1 tablet (50 mg total) by mouth daily. 30 tablet 2  . bacitracin ointment Apply topically 2 (two) times daily. Apply to right index finger open distal wound per guidance. 120 g 0   No current facility-administered medications on file prior to visit.   Family History  Problem Relation Age of Onset  . Arrhythmia Mother     MOTHER LIVED TO BE 102  . Coronary artery disease Mother   . Coronary artery disease Father 74  . Heart disease Father   . Heart attack Father   .  Heart attack Mother   . Heart attack Brother   . Stroke Neg Hx   . Hypertension Mother    Social History   Social History  . Marital Status: Single    Spouse Name: N/A  . Number of Children: N/A  . Years of Education: N/A   Occupational History  . ACCOUNTANT    Social History Main Topics  . Smoking status: Current Every Day Smoker -- 1.00 packs/day    Types: Cigarettes  . Smokeless tobacco: Not on file  . Alcohol Use: No  . Drug Use: Yes    Special: Marijuana  . Sexual Activity: Not on file   Other Topics Concern  . Not on file   Social History Narrative   REGULAR EXERCISE    Review of Systems: Other than what is stated in HPI, all other systems are negative.   Objective:   Filed Vitals:   07/06/15 1214  BP: 163/84  Pulse: 80   Temp: 98 F (36.7 C)  Resp: 16    Physical Exam  Constitutional: He is oriented to person, place, and time.  Cardiovascular: Normal rate, regular rhythm and normal heart sounds.   Pulmonary/Chest: Effort normal and breath sounds normal.  Musculoskeletal: He exhibits edema (slight swelling of R pointer finger).  Limited flexion of right pointer finger  Neurological: He is alert and oriented to person, place, and time.  Psychiatric:  aggravated     Lab Results  Component Value Date   WBC 11.3* 05/26/2015   HGB 14.8 05/26/2015   HCT 42.7 05/26/2015   MCV 85.2 05/26/2015   PLT 233 05/26/2015   Lab Results  Component Value Date   CREATININE 1.15 05/26/2015   BUN 6 05/26/2015   NA 135 05/26/2015   K 3.7 05/26/2015   CL 97* 05/26/2015   CO2 26 05/26/2015    Lab Results  Component Value Date   HGBA1C 6.2* 12/26/2014   Lipid Panel     Component Value Date/Time   CHOL 243* 04/17/2015 1616   TRIG 472* 04/17/2015 1616   HDL 28* 04/17/2015 1616   CHOLHDL 8.7* 04/17/2015 1616   VLDL NOT CALC 04/17/2015 1616   LDLCALC NOT CALC 04/17/2015 1616       Assessment and plan:   Peter Russo was seen today for follow-up.  Diagnoses and all orders for this visit:  Finger injury, right, subsequent encounter Letter given to patient. Encouraged him to continue to work the finger to regain mobility. I do not feel patient needs to be out of work relating to his injury at this time.   Essential hypertension BP is elevated in office today. Believe elevation is from aggravation as he voices his concern about clinic flow and his job situation.   Tobacco abuse Smoking cessation discussed for 3 minutes, patient is not willing to quit at this time. Will continue to assess on each visit. Discussed increased risk for diseases such as cancer, heart disease, and stroke.   Return in about 3 months (around 10/06/2015).       Ambrose FinlandValerie A Keck, NP-C [facility] [phone] 07/06/2015, 12:22 PM

## 2015-07-06 NOTE — Patient Instructions (Addendum)
Medication Instructions:  Your physician recommends that you continue on your current medications as directed. Please refer to the Current Medication list given to you today.   Labwork: TODAY:  CMET & LIPIDS  Testing/Procedures: None ordered  Follow-Up: Your physician wants you to follow-up in:  4 MONTHS WITH DR. Eden EmmsNISHAN.  You will receive a reminder letter in the mail two months in advance. If you don't receive a letter, please call our office to schedule the follow-up appointment.   Any Other Special Instructions Will Be Listed Below (If Applicable).   If you need a refill on your cardiac medications before your next appointment, please call your pharmacy.

## 2015-07-08 ENCOUNTER — Telehealth: Payer: Self-pay

## 2015-07-08 DIAGNOSIS — Z79899 Other long term (current) drug therapy: Secondary | ICD-10-CM

## 2015-07-08 DIAGNOSIS — E782 Mixed hyperlipidemia: Secondary | ICD-10-CM

## 2015-07-08 MED ORDER — FENOFIBRATE 48 MG PO TABS: 48.0000 mg | ORAL_TABLET | Freq: Every day | ORAL | Status: DC

## 2015-07-08 NOTE — Telephone Encounter (Signed)
Called patient about lab results. Per Ronie Spiesayna Dunn PA, Please call patient. CMET normal. Lipid panel shows really high triglycerides and low LDL. High triglycerides can put him at increased cardiovascular risk as well as risk of pancreatitis. Will be extremely important for him to lose weight and follow a very healthy diet. I spoke with Dr. Eden EmmsNishan for further recommendations. Please start Tricor 48mg  daily. 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 He has seen the patient in the interim for Dr. Clifton JamesMcAlhany and it was assumed this was his patient. Please repeat lipids/LFTs in 6 weeks. Patient verbalized understanding. Order labs and new medication. Patient is scheduled for labs on 08/20/2015.

## 2015-08-20 ENCOUNTER — Other Ambulatory Visit: Payer: Self-pay

## 2015-09-08 ENCOUNTER — Other Ambulatory Visit (INDEPENDENT_AMBULATORY_CARE_PROVIDER_SITE_OTHER): Payer: Self-pay

## 2015-09-08 DIAGNOSIS — E782 Mixed hyperlipidemia: Secondary | ICD-10-CM

## 2015-09-08 DIAGNOSIS — Z79899 Other long term (current) drug therapy: Secondary | ICD-10-CM

## 2015-09-08 LAB — LIPID PANEL
Cholesterol: 260 mg/dL — ABNORMAL HIGH (ref 125–200)
HDL: 32 mg/dL — ABNORMAL LOW (ref >=40)
LDL Cholesterol: 184 mg/dL — ABNORMAL HIGH (ref <130)
Total CHOL/HDL Ratio: 8.1 Ratio — ABNORMAL HIGH (ref <=5.0)
Triglycerides: 221 mg/dL — ABNORMAL HIGH (ref <150)
VLDL: 44 mg/dL — ABNORMAL HIGH (ref <30)

## 2015-09-08 LAB — HEPATIC FUNCTION PANEL
ALT: 22 U/L (ref 9–46)
AST: 19 U/L (ref 10–40)
Albumin: 4.2 g/dL (ref 3.6–5.1)
Alkaline Phosphatase: 85 U/L (ref 40–115)
Bilirubin, Direct: 0.1 mg/dL (ref <=0.2)
Indirect Bilirubin: 0.4 mg/dL (ref 0.2–1.2)
Total Bilirubin: 0.5 mg/dL (ref 0.2–1.2)
Total Protein: 7.5 g/dL (ref 6.1–8.1)

## 2015-09-09 ENCOUNTER — Telehealth: Payer: Self-pay

## 2015-09-09 DIAGNOSIS — E785 Hyperlipidemia, unspecified: Secondary | ICD-10-CM

## 2015-09-09 NOTE — Telephone Encounter (Signed)
Called Patient with lab results. Patient complained of chest pain with activity. Patient stated this has been going on for a week and he has history of MI. Scheduled patient with APP to be evaluated.

## 2015-09-09 NOTE — Telephone Encounter (Signed)
-----   Message from Laurann Montana, PA-C sent at 09/09/2015  6:27 AM EST ----- Lipid panel does not look good. LDL way too high, HDL remains low. Triglycerides are better on Tricor but overall he has a ways to go still. Please make sure he has been compliant with his Lipitor/Tricor. Refer to lipid clinic with pharmacist. Offer referral to nutritionist as well to discuss diet measures to improve cholesterol. Dayna Dunn PA-C

## 2015-09-10 ENCOUNTER — Telehealth: Payer: Self-pay

## 2015-09-10 ENCOUNTER — Encounter: Payer: Self-pay | Admitting: Physician Assistant

## 2015-09-10 NOTE — Progress Notes (Signed)
Cardiology Office Note Date:  09/10/2015  Patient ID:  Peter Russo, Peter Russo Nov 08, 1975, MRN [id number] PCP:  Ambrose Finland, NP  Cardiologist:  Dr. Clifton James  **refresh   Chief Complaint: CP  History of Present Illness: Peter Russo is a 40 y.o. male with history of CAD, HTN, HLD, GERD, obesity, an ** active smoker (counseled), anxiety and pre-DM, comes to the office today c/o **  His CAD history is of: BMS-mLAD 2010, DES to RCA 2012, and 04/2015 - DES to LCx and PCI to LAD c/b dissection with subsequent overlapping DES to mLAD - r/i for NSTEMI afterwards.  He reports ** compliance with his ASA/Plavix tx Describes his CP as **   Past Medical History  Diagnosis Date  . 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 GERD (gastroesophageal reflux disease)   . Essential hypertension   . Hypercholesteremia   . CAD S/P percutaneous coronary angioplasty     a. s/p BMS-mLAD 2010. b. DES to RCA 2012. c. 04/2015: DES to LCx and PCI to LAD c/b dissection with subsequent overlapping DES to mLAD - r/i for NSTEMI afterwards.  . Tobacco abuse   . GAD (generalized anxiety disorder)   . Depression   . Pre-diabetes   . Morbid obesity [facility])     Past Surgical History  Procedure Laterality Date  . Coronary stent placement    . Cardiac catheterization N/A 04/23/2015    Procedure: Left Heart Cath and Coronary Angiography;  Surgeon: Wendall Stade, MD;  Location: [facility] INVASIVE CV LAB;  Service: Cardiovascular;  Laterality: N/A;  . Cardiac catheterization N/A 04/23/2015    Procedure: Coronary Stent Intervention;  Surgeon: Tonny Bollman, MD;  Location: [facility] INVASIVE CV LAB;  Service: Cardiovascular;  Laterality: N/A;    Current Outpatient Prescriptions  Medication Sig Dispense Refill  . ALPRAZolam (XANAX) 0.5 MG tablet Take 1 tablet (0.5 mg total) by mouth 2 (two) times daily as needed for anxiety. 30 tablet 0  . aspirin EC 81 MG  tablet Take 81 mg by mouth daily.    Marland Kitchen atorvastatin (LIPITOR) 80 MG tablet Take 1 tablet (80 mg total) by mouth daily at 6 PM. 30 tablet 11  . bacitracin ointment Apply topically 2 (two) times daily. Apply to right index finger open distal wound per guidance. 120 g 0  . clopidogrel (PLAVIX) 75 MG tablet Take 1 tablet (75 mg total) by mouth daily with breakfast. 30 tablet 11  . fenofibrate (TRICOR) 48 MG tablet Take 1 tablet (48 mg total) by mouth daily. 30 tablet 11  . lisinopril (PRINIVIL,ZESTRIL) 5 MG tablet Take 1 tablet (5 mg total) by mouth daily. 30 tablet 5  . metoprolol tartrate (LOPRESSOR) 25 MG tablet Take 1 tablet (25 mg total) by mouth 2 (two) times daily. 60 tablet 11  . nitroGLYCERIN (NITROSTAT) 0.4 MG SL tablet Place 1 tablet (0.4 mg total) under the tongue every 5 (five) minutes as needed for chest pain. 25 tablet 3  . sertraline (ZOLOFT) 50 MG tablet Take 1 tablet (50 mg total) by mouth daily. 30 tablet 2   No current facility-administered medications for this visit.    Allergies:   Review of patient's allergies indicates no known allergies.   Social History:  The patient  reports that he has been smoking Cigarettes.  He has been smoking about 1.00 pack per day. He does not have any smokeless tobacco history  on file. He reports that he uses illicit drugs (Marijuana). He reports that he does not drink alcohol.   Family History:  The patient's family history includes Arrhythmia in his mother; Coronary artery disease in his mother; Coronary artery disease (age of onset: 43) in his father; Heart attack in his brother, father, and mother; Heart disease in his father; Hypertension in his mother. There is no history of Stroke.  ROS:  Please see the history of present illness. Otherwise, review of systems is positive for   All other systems are reviewed and otherwise negative.   PHYSICAL EXAM: ** VS:  There were no vitals taken for this visit. BMI: There is no weight on file to  calculate BMI. Well nourished, well developed, in no acute distress HEENT: normocephalic, atraumatic Neck: no JVD, carotid bruits or masses Cardiac:  normal S1, S2; RRR; no significant murmurs, no rubs, or gallops Lungs:  clear to auscultation bilaterally, no wheezing, rhonchi or rales Abd: soft, nontender,  + BS MS: no deformity or atrophy Ext: no edema Skin: warm and dry, no rash Neuro:  No gross deficits appreciated Psych: euthymic mood, full affect   EKG:  Done today shows **  04/23/15: LHC Conclusion     Mid RCA lesion, 100% stenosed.  Prox Cx to Mid Cx lesion, 80% stenosed. The lesion was not previously treated.  Dist LAD lesion, 80% stenosed.  Mid LAD lesion, 25% stenosed. The lesion was previously treated with a stent (unknown type) .  1) 3V CAD. Occluded stents in mid RCA. New lesion in mid Circumflex and distal LAD. Stent in proximal LAD widely patent. Reviewed with Dr Excell Seltzer. Plan PCI/Stenting of proximal circumflex and distal LAD 2) Normal LV function EF 60% inferobasal hypokinesis         04/23/15: PCI Conclusion     Prox Cx lesion, 90% stenosed. There is a 0% residual stenosis post intervention.  A drug-eluting stent was placed.  Mid LAD to Dist LAD lesion, 80% stenosed. There is a 0% residual stenosis post intervention. The lesion was previously treated with a stent (unknown type) .  A drug-eluting stent was placed.  Ost 2nd Diag to 2nd Diag lesion, 100% stenosed. There is a 40% residual stenosis post intervention.  1. Severe left circumflex stenosis treated successfully with PCI using a Synergy DES 2. Severe complex mid LAD stenosis treated successfully using PCI with overlapping drug-eluting stents. Very complex procedure complicated by extensive dissection and sidebranch occlusion of the diagonal, treated successfully with stenting of the LAD dissection and extensive balloon angioplasty of the diagonal dissection.     03/26/09:  Echocardiogram Study Conclusions Left ventricle: The cavity size was normal. Wall thickness was normal. Systolic function was normal. The estimated ejection fraction was in the range of 55% to 65%. Wall motion was normal; there were no regional wall motion abnormalities. Features are consistent with a pseudonormal left ventricular filling pattern, with concomitant abnormal relaxation and increased filling pressure (grade 2 diastolic dysfunction).  Recent Labs: 12/26/2014: TSH 0.534 05/26/2015: Hemoglobin 14.8; Platelets 233 07/06/2015: BUN 10; Creat 1.02; Potassium 4.1; Sodium 139 09/08/2015: ALT 22  09/08/2015: Cholesterol 260*; HDL 32*; LDL Cholesterol 184*; Total CHOL/HDL Ratio 8.1*; Triglycerides 221*; VLDL 44*   CrCl cannot be calculated (Unknown ideal weight.).   Wt Readings from Last 3 Encounters:  07/06/15 264 lb (119.75 kg)  07/06/15 263 lb (119.296 kg)  05/27/15 257 lb 8 oz (116.8 kg)     Other studies reviewed: Additional studies/records reviewed today include:  summarized above  ASSESSMENT AND PLAN:  1. CP/CAD     Last PCI as noted above with DES to LCx, RCA (complicated by dissection of LAD)     ** on DAPT, ** he reports compliance with his medicines     ** plan  2. HLD     Remains uncontrolled despite recent adjustements tohis medicines     Audrie Lia, discussed case, recommends add on Zetia to his therapy and f/u labs, if not to goal may be a candidate for PCSK9 inhibitor       3. HTN  4. Morbid obesity     ** re-enforced dietary, lifestyle changes  5. Tobacco abuse      ** counseled at length, he ** is receptive  Disposition: F/u with **    Current medicines are reviewed at length with the patient today.  The patient did not have any concerns regarding medicines.Judith Blonder, PA-C 09/10/2015 11:51 AM     [facility] [address] [place] [address] [phone] (office)  [phone] (fax)    This  encounter was created in error - please disregard.

## 2015-09-10 NOTE — Telephone Encounter (Signed)
Patient missed his appointment today. Called patient. Patient thought his appointment was tomorrow and apologized . Rescheduled patient for Friday appointment. Patient verbalized understanding.

## 2015-09-10 NOTE — Telephone Encounter (Signed)
ERROR

## 2015-09-11 ENCOUNTER — Encounter: Payer: Self-pay | Admitting: Cardiology

## 2015-09-11 ENCOUNTER — Ambulatory Visit (INDEPENDENT_AMBULATORY_CARE_PROVIDER_SITE_OTHER): Payer: Self-pay | Admitting: Cardiology

## 2015-09-11 VITALS — BP 132/78 | HR 75 | Ht 67.0 in | Wt 263.0 lb

## 2015-09-11 DIAGNOSIS — E782 Mixed hyperlipidemia: Secondary | ICD-10-CM

## 2015-09-11 DIAGNOSIS — Z79899 Other long term (current) drug therapy: Secondary | ICD-10-CM

## 2015-09-11 DIAGNOSIS — I2510 Atherosclerotic heart disease of native coronary artery without angina pectoris: Secondary | ICD-10-CM

## 2015-09-11 DIAGNOSIS — R079 Chest pain, unspecified: Secondary | ICD-10-CM

## 2015-09-11 DIAGNOSIS — Z720 Tobacco use: Secondary | ICD-10-CM

## 2015-09-11 DIAGNOSIS — Z9861 Coronary angioplasty status: Secondary | ICD-10-CM

## 2015-09-11 MED ORDER — EZETIMIBE 10 MG PO TABS: 10.0000 mg | ORAL_TABLET | Freq: Every day | ORAL | Status: DC

## 2015-09-11 MED ORDER — ISOSORBIDE MONONITRATE ER 30 MG PO TB24: 30.0000 mg | ORAL_TABLET | Freq: Every day | ORAL | Status: DC

## 2015-09-11 MED ORDER — ATORVASTATIN CALCIUM 80 MG PO TABS: 80.0000 mg | ORAL_TABLET | Freq: Every day | ORAL | Status: DC

## 2015-09-11 NOTE — Patient Instructions (Addendum)
Medication Instructions:  Your physician has recommended you make the following change in your medication:  1-START Zetia 10 mg by mouth daily 2-START Imdur 30 mg by mouth daily  Labwork: Your physician recommends that you return for lab work in: 6 weeks for Lipid and liver panel. These are fasting labs, so no eating or drinking after midnight the day before lab work is done.  Testing/Procedures: Your physician has requested that you have en exercise stress myoview. For further information please visit [email or web address] Please follow instruction sheet, as given.  Follow-Up: Your physician wants you to follow-up with APP after Stress Test.  If you need a refill on your cardiac medications before your next appointment, please call your pharmacy.

## 2015-09-11 NOTE — Progress Notes (Signed)
Cardiology Office Note   Date:  09/11/2015   ID:  Peter Russo, DOB 17-May-1976, MRN [id number]  PCP:  Ambrose Finland, NP  Cardiologist:  Dr. Eden Emms     Chief Complaint  Patient presents with  . Chest Pain      History of Present Illness: Peter Russo is a 40 y.o. male who presents for chest pain.    He has a history of CAD (s/p BMS-mLAD 2010, DES to RCA 2012, and 04/2015 - DES to LCx and PCI to LAD c/b dissection with subsequent overlapping DES to mLAD - r/i for NSTEMI afterwards), tobacco abuse, HTN, GAD, prediabetes, HLD, GERD, remote SVT, obesity, depression who presents for chest pain that began 1.5 weeks ago.  He saw Peter Russo in the office in August with multiple complaints. Some of his symptoms were concerning for [place] thus outpatient cath was arranged. He underwent this on 04/23/15 with subsequent PCI of the left circumflex with drug-eluting stent and PCI of LAD complicated by dissection with overlapping stents placed. He ruled in afterwards for NSTEMI (not unexpected given procedural complexity and transient vessel occlusion). DAPT with ASA/Plavix for at least 12 months was recommended. He was hospitalized in 05/2015 for a finger infection now resolved.  Today he complains of chest pain that began 1.5 weeks ago.  Only occurs with significant exertion.  Chest pressure and SOB similar to symptoms prior to recent stents in Sept.  Though not nearly as significant.  Last night with pain it resolved with 1 NTG sl.  Today emotional he is afraid with family hx that he will die.  He is no longer on zoloft.    Past Medical History  Diagnosis Date  . 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 GERD (gastroesophageal reflux disease)   . Essential hypertension   . Hypercholesteremia   . CAD S/P percutaneous coronary angioplasty     a. s/p BMS-mLAD 2010. b. DES to RCA 2012. c. 04/2015: DES to LCx and PCI to  LAD c/b dissection with subsequent overlapping DES to mLAD - r/i for NSTEMI afterwards.  . Tobacco abuse   . GAD (generalized anxiety disorder)   . Depression   . Pre-diabetes   . Morbid obesity [facility])     Past Surgical History  Procedure Laterality Date  . Coronary stent placement    . Cardiac catheterization N/A 04/23/2015    Procedure: Left Heart Cath and Coronary Angiography;  Surgeon: Wendall Stade, MD;  Location: [facility] INVASIVE CV LAB;  Service: Cardiovascular;  Laterality: N/A;  . Cardiac catheterization N/A 04/23/2015    Procedure: Coronary Stent Intervention;  Surgeon: Tonny Bollman, MD;  Location: [facility] INVASIVE CV LAB;  Service: Cardiovascular;  Laterality: N/A;     Current Outpatient Prescriptions  Medication Sig Dispense Refill  . ALPRAZolam (XANAX) 0.5 MG tablet Take 1 tablet (0.5 mg total) by mouth 2 (two) times daily as needed for anxiety. 30 tablet 0  . aspirin EC 81 MG tablet Take 81 mg by mouth daily.    Marland Kitchen atorvastatin (LIPITOR) 80 MG tablet Take 1 tablet (80 mg total) by mouth daily at 6 PM. 90 tablet 3  . clopidogrel (PLAVIX) 75 MG tablet Take 1 tablet (75 mg total) by mouth daily with breakfast. 30 tablet 11  . fenofibrate (TRICOR) 48 MG tablet Take 1 tablet (48 mg total) by mouth daily. 30 tablet 11  . lisinopril (  PRINIVIL,ZESTRIL) 5 MG tablet Take 1 tablet (5 mg total) by mouth daily. 30 tablet 5  . metoprolol tartrate (LOPRESSOR) 25 MG tablet Take 1 tablet (25 mg total) by mouth 2 (two) times daily. 60 tablet 11  . nitroGLYCERIN (NITROSTAT) 0.4 MG SL tablet Place 0.4 mg under the tongue every 5 (five) minutes as needed for chest pain (3 x).    . ezetimibe (ZETIA) 10 MG tablet Take 1 tablet (10 mg total) by mouth daily. 90 tablet 3  . isosorbide mononitrate (IMDUR) 30 MG 24 hr tablet Take 1 tablet (30 mg total) by mouth daily. 90 tablet 3  . sertraline (ZOLOFT) 50 MG tablet Take 1 tablet (50 mg total) by mouth daily. 30 tablet 2   No current facility-administered  medications for this visit.    Allergies:   Review of patient's allergies indicates no known allergies.    Social History:  The patient  reports that he has been smoking Cigarettes.  He has been smoking about 1.00 pack per day. He does not have any smokeless tobacco history on file. He reports that he uses illicit drugs (Marijuana). He reports that he does not drink alcohol.   Family History:  The patient's family history includes Arrhythmia in his mother; Coronary artery disease in his mother; Coronary artery disease (age of onset: 63) in his father; Heart attack in his brother, father, and mother; Heart disease in his father; Hypertension in his mother. There is no history of Stroke.    ROS:  General:no colds or fevers, no weight changes Skin:no rashes or ulcers HEENT:no blurred vision, no congestion CV:see HPI PUL:see HPI GI:no diarrhea constipation or melena, no indigestion GU:no hematuria, no dysuria MS:no joint pain, no claudication Neuro:no syncope, no lightheadedness Endo:no diabetes, no thyroid disease  Wt Readings from Last 3 Encounters:  09/11/15 263 lb (119.296 kg)  07/06/15 264 lb (119.75 kg)  07/06/15 263 lb (119.296 kg)     PHYSICAL EXAM: VS:  BP 132/78 mmHg  Pulse 75  Ht  (1.702 m)  Wt 263 lb (119.296 kg)  BMI 41.18 kg/m2 , BMI Body mass index is 41.18 kg/(m^2). General:Pleasant affect, NAD Skin:Warm and dry, brisk capillary refill HEENT:normocephalic, sclera clear, mucus membranes moist Neck:supple, no JVD, no bruits  Heart:S1S2 RRR without murmur, gallup, rub or click Lungs:clear without rales, rhonchi, or wheezes ZOX:WRUE, non tender, + BS, do not palpate liver spleen or masses Ext:no lower ext edema, 2+ pedal pulses, 2+ radial pulses Neuro:alert and oriented X 3, MAE, follows commands, + facial symmetry    EKG:  EKG is ordered today. The ekg ordered today demonstrates SR with incomplete RBBB, no acute changes from previous EKG.    Recent  Labs: 12/26/2014: TSH 0.534 05/26/2015: Hemoglobin 14.8; Platelets 233 07/06/2015: BUN 10; Creat 1.02; Potassium 4.1; Sodium 139 09/08/2015: ALT 22    Lipid Panel    Component Value Date/Time   CHOL 260* 09/08/2015 1345   TRIG 221* 09/08/2015 1345   HDL 32* 09/08/2015 1345   CHOLHDL 8.1* 09/08/2015 1345   VLDL 44* 09/08/2015 1345   LDLCALC 184* 09/08/2015 1345       Other studies Reviewed: Additional studies/ records that were reviewed today include: previous cath, OV notes.  ASSESSMENT AND PLAN:  1.  Exertional angina- relief with NTG.  Will add imdur 30 mg daily and have pt have treadmill myoview.  His stents are 4 months out.  He is agreeable.  If pain increase he is aware to go to  ER. He will follow up after stress test.   2. CAD with recent stents--(s/p BMS-mLAD 2010, DES to RCA 2012, and 04/2015 - DES to LCx and PCI to LAD c/b dissection with subsequent overlapping DES to mLAD now totally occluded RCA.  3. Tobacco use, has decreased but has not yet stopped.    4. Hyperlipidemia with continued elevated LDL,  zetia has been recommended by lipid clinic will add today and recheck lipids and hepatic in 4-5 weeks.   5. HTN controlled  6. Anxiety /depression, needs follow up with PCP for treatment.    Current medicines are reviewed with the patient today.  The patient Has no concerns regarding medicines.  The following changes have been made:  See above Labs/ tests ordered today include:see above  Disposition:   FU:  see above  Signed, Leone Brand, NP  09/11/2015 5:25 PM    [facility] [address] [place], [place], [place]  [address]/ [address] [place], [place] Phone: [phone]; Fax: [phone]  [phone]

## 2015-09-14 ENCOUNTER — Encounter (HOSPITAL_COMMUNITY)
Admission: EM | Disposition: A | Discharge status: Home / Self Care | Payer: Self-pay | Source: Ambulatory Visit | Attending: Interventional Cardiology

## 2015-09-14 ENCOUNTER — Other Ambulatory Visit: Payer: Self-pay

## 2015-09-14 ENCOUNTER — Encounter (HOSPITAL_COMMUNITY): Payer: Self-pay | Admitting: Interventional Cardiology

## 2015-09-14 ENCOUNTER — Inpatient Hospital Stay (HOSPITAL_COMMUNITY)
Admission: EM | Admit: 2015-09-14 | Discharge: 2015-09-18 | DRG: 246 | Disposition: A | Discharge status: Home / Self Care | Payer: Self-pay | Source: Ambulatory Visit | Attending: Interventional Cardiology | Admitting: Interventional Cardiology

## 2015-09-14 DIAGNOSIS — I2102 ST elevation (STEMI) myocardial infarction involving left anterior descending coronary artery: Secondary | ICD-10-CM | POA: Diagnosis present

## 2015-09-14 DIAGNOSIS — Z599 Problem related to housing and economic circumstances, unspecified: Secondary | ICD-10-CM

## 2015-09-14 DIAGNOSIS — Z6841 Body Mass Index (BMI) 40.0 and over, adult: Secondary | ICD-10-CM

## 2015-09-14 DIAGNOSIS — Z955 Presence of coronary angioplasty implant and graft: Secondary | ICD-10-CM

## 2015-09-14 DIAGNOSIS — E785 Hyperlipidemia, unspecified: Secondary | ICD-10-CM | POA: Diagnosis present

## 2015-09-14 DIAGNOSIS — K219 Gastro-esophageal reflux disease without esophagitis: Secondary | ICD-10-CM | POA: Diagnosis present

## 2015-09-14 DIAGNOSIS — Z7982 Long term (current) use of aspirin: Secondary | ICD-10-CM

## 2015-09-14 DIAGNOSIS — I252 Old myocardial infarction: Secondary | ICD-10-CM

## 2015-09-14 DIAGNOSIS — I10 Essential (primary) hypertension: Secondary | ICD-10-CM | POA: Diagnosis present

## 2015-09-14 DIAGNOSIS — R7303 Prediabetes: Secondary | ICD-10-CM | POA: Diagnosis present

## 2015-09-14 DIAGNOSIS — I255 Ischemic cardiomyopathy: Secondary | ICD-10-CM | POA: Diagnosis present

## 2015-09-14 DIAGNOSIS — T82857A Stenosis of cardiac prosthetic devices, implants and grafts, initial encounter: Principal | ICD-10-CM | POA: Diagnosis present

## 2015-09-14 DIAGNOSIS — Z9114 Patient's other noncompliance with medication regimen: Secondary | ICD-10-CM

## 2015-09-14 DIAGNOSIS — F17210 Nicotine dependence, cigarettes, uncomplicated: Secondary | ICD-10-CM | POA: Diagnosis present

## 2015-09-14 DIAGNOSIS — I5022 Chronic systolic (congestive) heart failure: Secondary | ICD-10-CM | POA: Diagnosis present

## 2015-09-14 DIAGNOSIS — F411 Generalized anxiety disorder: Secondary | ICD-10-CM | POA: Diagnosis present

## 2015-09-14 DIAGNOSIS — I2510 Atherosclerotic heart disease of native coronary artery without angina pectoris: Secondary | ICD-10-CM | POA: Diagnosis present

## 2015-09-14 DIAGNOSIS — I209 Angina pectoris, unspecified: Secondary | ICD-10-CM | POA: Diagnosis present

## 2015-09-14 DIAGNOSIS — Z7902 Long term (current) use of antithrombotics/antiplatelets: Secondary | ICD-10-CM

## 2015-09-14 DIAGNOSIS — Z79899 Other long term (current) drug therapy: Secondary | ICD-10-CM

## 2015-09-14 DIAGNOSIS — Z720 Tobacco use: Secondary | ICD-10-CM | POA: Diagnosis present

## 2015-09-14 HISTORY — DX: Ischemic cardiomyopathy: I25.5

## 2015-09-14 HISTORY — PX: CARDIAC CATHETERIZATION: SHX172

## 2015-09-14 LAB — POCT I-STAT, CHEM 8
BUN: 13 mg/dL (ref 6–20)
Calcium, Ion: 1.2 mmol/L (ref 1.12–1.23)
Chloride: 106 mmol/L (ref 101–111)
Creatinine, Ser: 1 mg/dL (ref 0.61–1.24)
Glucose, Bld: 135 mg/dL — ABNORMAL HIGH (ref 65–99)
HCT: 44 % (ref 39.0–52.0)
Hemoglobin: 15 g/dL (ref 13.0–17.0)
Potassium: 3.6 mmol/L (ref 3.5–5.1)
Sodium: 141 mmol/L (ref 135–145)
TCO2: 21 mmol/L (ref 0–100)

## 2015-09-14 LAB — PROTIME-INR
INR: 5.48 (ref 0.00–1.49)
Prothrombin Time: 48.2 seconds — ABNORMAL HIGH (ref 11.6–15.2)

## 2015-09-14 LAB — CBC
HCT: 39.9 % (ref 39.0–52.0)
HCT: 38.5 % — ABNORMAL LOW (ref 39.0–52.0)
Hemoglobin: 14 g/dL (ref 13.0–17.0)
Hemoglobin: 13.4 g/dL (ref 13.0–17.0)
MCH: 29.6 pg (ref 26.0–34.0)
MCH: 29.1 pg (ref 26.0–34.0)
MCHC: 35.1 g/dL (ref 30.0–36.0)
MCHC: 34.8 g/dL (ref 30.0–36.0)
MCV: 84.4 fL (ref 78.0–100.0)
MCV: 83.5 fL (ref 78.0–100.0)
Platelets: 174 10*3/uL (ref 150–400)
Platelets: 202 10*3/uL (ref 150–400)
RBC: 4.73 MIL/uL (ref 4.22–5.81)
RBC: 4.61 MIL/uL (ref 4.22–5.81)
RDW: 13.8 % (ref 11.5–15.5)
RDW: 13.7 % (ref 11.5–15.5)
WBC: 9 10*3/uL (ref 4.0–10.5)
WBC: 12.1 10*3/uL — ABNORMAL HIGH (ref 4.0–10.5)

## 2015-09-14 LAB — LIPID PANEL
Cholesterol: 215 mg/dL — ABNORMAL HIGH (ref 0–200)
HDL: 30 mg/dL — ABNORMAL LOW (ref >40)
LDL Cholesterol: 139 mg/dL — ABNORMAL HIGH (ref 0–99)
Total CHOL/HDL Ratio: 7.2 RATIO
Triglycerides: 228 mg/dL — ABNORMAL HIGH (ref <150)
VLDL: 46 mg/dL — ABNORMAL HIGH (ref 0–40)

## 2015-09-14 LAB — BRAIN NATRIURETIC PEPTIDE: B Natriuretic Peptide: 40.7 pg/mL (ref 0.0–100.0)

## 2015-09-14 LAB — COMPREHENSIVE METABOLIC PANEL
ALT: 22 U/L (ref 17–63)
AST: 46 U/L — ABNORMAL HIGH (ref 15–41)
Albumin: 3.3 g/dL — ABNORMAL LOW (ref 3.5–5.0)
Alkaline Phosphatase: 72 U/L (ref 38–126)
Anion gap: 10 (ref 5–15)
BUN: 11 mg/dL (ref 6–20)
CO2: 20 mmol/L — ABNORMAL LOW (ref 22–32)
Calcium: 8.4 mg/dL — ABNORMAL LOW (ref 8.9–10.3)
Chloride: 107 mmol/L (ref 101–111)
Creatinine, Ser: 1.13 mg/dL (ref 0.61–1.24)
GFR calc Af Amer: >60 mL/min (ref >60)
GFR calc non Af Amer: >60 mL/min (ref >60)
Glucose, Bld: 132 mg/dL — ABNORMAL HIGH (ref 65–99)
Potassium: 3.6 mmol/L (ref 3.5–5.1)
Sodium: 137 mmol/L (ref 135–145)
Total Bilirubin: 0.4 mg/dL (ref 0.3–1.2)
Total Protein: 6.3 g/dL — ABNORMAL LOW (ref 6.5–8.1)

## 2015-09-14 LAB — APTT: aPTT: 146 seconds — ABNORMAL HIGH (ref 24–37)

## 2015-09-14 LAB — TROPONIN I
Troponin I: 1.85 ng/mL (ref <0.031)
Troponin I: 20.45 ng/mL (ref <0.031)
Troponin I: >65 ng/mL (ref <0.031)

## 2015-09-14 LAB — MRSA PCR SCREENING: MRSA by PCR: NEGATIVE

## 2015-09-14 LAB — CK TOTAL AND CKMB (NOT AT ARMC)
CK, MB: 41.2 ng/mL — ABNORMAL HIGH (ref 0.5–5.0)
Relative Index: 4.6 — ABNORMAL HIGH (ref 0.0–2.5)
Total CK: 897 U/L — ABNORMAL HIGH (ref 49–397)

## 2015-09-14 LAB — POCT ACTIVATED CLOTTING TIME: Activated Clotting Time: 606 seconds

## 2015-09-14 SURGERY — LEFT HEART CATH AND CORONARY ANGIOGRAPHY

## 2015-09-14 MED ORDER — CANGRELOR BOLUS VIA INFUSION
INTRAVENOUS | Status: DC | PRN
  Administered 2015-09-14: 3579 ug via INTRAVENOUS

## 2015-09-14 MED ORDER — ASPIRIN 81 MG PO CHEW
81.0000 mg | CHEWABLE_TABLET | Freq: Every day | ORAL | Status: DC
  Administered 2015-09-15 – 2015-09-18 (×4): 81 mg via ORAL
  Filled 2015-09-14 (×4): qty 1

## 2015-09-14 MED ORDER — IOHEXOL 350 MG/ML SOLN
INTRAVENOUS | Status: DC | PRN
  Administered 2015-09-14: 260 mL via INTRA_ARTERIAL

## 2015-09-14 MED ORDER — ATORVASTATIN CALCIUM 80 MG PO TABS
80.0000 mg | ORAL_TABLET | Freq: Every day | ORAL | Status: DC
  Administered 2015-09-14 – 2015-09-17 (×4): 80 mg via ORAL
  Filled 2015-09-14 (×4): qty 1

## 2015-09-14 MED ORDER — SODIUM CHLORIDE 0.9 % IV SOLN: 250.0000 mL | INTRAVENOUS | Status: DC | PRN

## 2015-09-14 MED ORDER — ONDANSETRON HCL 4 MG/2ML IJ SOLN: 4.0000 mg | Freq: Four times a day (QID) | INTRAMUSCULAR | Status: DC | PRN

## 2015-09-14 MED ORDER — SODIUM CHLORIDE 0.9 % IV SOLN
250.0000 mg | INTRAVENOUS | Status: DC | PRN
  Administered 2015-09-14 (×2): 1.75 mg/kg/h via INTRAVENOUS

## 2015-09-14 MED ORDER — LIDOCAINE HCL (PF) 1 % IJ SOLN
INTRAMUSCULAR | Status: DC | PRN
  Administered 2015-09-14: 09:00:00

## 2015-09-14 MED ORDER — TICAGRELOR 90 MG PO TABS
90.0000 mg | ORAL_TABLET | Freq: Two times a day (BID) | ORAL | Status: DC
  Administered 2015-09-14 – 2015-09-18 (×8): 90 mg via ORAL
  Filled 2015-09-14 (×8): qty 1

## 2015-09-14 MED ORDER — CETYLPYRIDINIUM CHLORIDE 0.05 % MT LIQD
7.0000 mL | Freq: Two times a day (BID) | OROMUCOSAL | Status: DC
  Administered 2015-09-15 – 2015-09-18 (×3): 7 mL via OROMUCOSAL

## 2015-09-14 MED ORDER — MORPHINE SULFATE (PF) 2 MG/ML IV SOLN
2.0000 mg | INTRAVENOUS | Status: DC | PRN
  Administered 2015-09-14 (×3): 2 mg via INTRAVENOUS
  Filled 2015-09-14 (×2): qty 1

## 2015-09-14 MED ORDER — SERTRALINE HCL 50 MG PO TABS: 50.0000 mg | ORAL_TABLET | Freq: Every day | ORAL | Status: DC

## 2015-09-14 MED ORDER — TIROFIBAN (AGGRASTAT) BOLUS VIA INFUSION
25.0000 ug/kg | Freq: Once | INTRAVENOUS | Status: AC
  Administered 2015-09-14: 2950 ug via INTRAVENOUS
  Filled 2015-09-14: qty 59

## 2015-09-14 MED ORDER — METOPROLOL TARTRATE 25 MG PO TABS
25.0000 mg | ORAL_TABLET | Freq: Two times a day (BID) | ORAL | Status: DC
  Administered 2015-09-14 – 2015-09-18 (×9): 25 mg via ORAL
  Filled 2015-09-14: qty 1
  Filled 2015-09-14 (×5): qty 2
  Filled 2015-09-14 (×3): qty 1

## 2015-09-14 MED ORDER — SODIUM CHLORIDE 0.9 % IV SOLN: INTRAVENOUS | Status: AC

## 2015-09-14 MED ORDER — TIROFIBAN HCL IN NACL 5-0.9 MG/100ML-% IV SOLN
0.1500 ug/kg/min | INTRAVENOUS | Status: DC
  Administered 2015-09-14 – 2015-09-15 (×5): 0.15 ug/kg/min via INTRAVENOUS
  Filled 2015-09-14 (×3): qty 100
  Filled 2015-09-14: qty 200
  Filled 2015-09-14: qty 100

## 2015-09-14 MED ORDER — NITROGLYCERIN 1 MG/10 ML FOR IR/CATH LAB
INTRA_ARTERIAL | Status: DC | PRN
Start: 2015-09-14 — End: 2015-09-14
  Administered 2015-09-14: 200 ug via INTRACORONARY

## 2015-09-14 MED ORDER — ATORVASTATIN CALCIUM 80 MG PO TABS: 80.0000 mg | ORAL_TABLET | Freq: Every day | ORAL | Status: DC

## 2015-09-14 MED ORDER — SODIUM CHLORIDE 0.9% FLUSH
3.0000 mL | Freq: Two times a day (BID) | INTRAVENOUS | Status: DC
Start: 2015-09-14 — End: 2015-09-18
  Administered 2015-09-14 – 2015-09-18 (×7): 3 mL via INTRAVENOUS

## 2015-09-14 MED ORDER — SODIUM CHLORIDE 0.9 % IV SOLN
50000.0000 ug | INTRAVENOUS | Status: DC | PRN
  Administered 2015-09-14: 4 ug/kg/min via INTRAVENOUS

## 2015-09-14 MED ORDER — MORPHINE SULFATE (PF) 2 MG/ML IV SOLN
INTRAVENOUS | Status: AC
  Filled 2015-09-14: qty 1

## 2015-09-14 MED ORDER — BIVALIRUDIN BOLUS VIA INFUSION - CUPID
INTRAVENOUS | Status: DC | PRN
  Administered 2015-09-14: 89.475 mg via INTRAVENOUS

## 2015-09-14 MED ORDER — NITROGLYCERIN IN D5W 200-5 MCG/ML-% IV SOLN
INTRAVENOUS | Status: DC | PRN
  Administered 2015-09-14: 15 ug/min via INTRAVENOUS

## 2015-09-14 MED ORDER — SODIUM CHLORIDE 0.9% FLUSH: 3.0000 mL | INTRAVENOUS | Status: DC | PRN

## 2015-09-14 MED ORDER — TICAGRELOR 90 MG PO TABS
ORAL_TABLET | ORAL | Status: DC | PRN
  Administered 2015-09-14: 180 mg via ORAL

## 2015-09-14 MED ORDER — ACETAMINOPHEN 325 MG PO TABS: 650.0000 mg | ORAL_TABLET | ORAL | Status: DC | PRN

## 2015-09-14 MED ORDER — MIDAZOLAM HCL 2 MG/2ML IJ SOLN
INTRAMUSCULAR | Status: DC | PRN
  Administered 2015-09-14 (×3): 1 mg via INTRAVENOUS

## 2015-09-14 MED ORDER — LISINOPRIL 5 MG PO TABS
5.0000 mg | ORAL_TABLET | Freq: Every day | ORAL | Status: DC
  Administered 2015-09-14 – 2015-09-18 (×5): 5 mg via ORAL
  Filled 2015-09-14 (×5): qty 1

## 2015-09-14 MED ORDER — FENTANYL CITRATE (PF) 100 MCG/2ML IJ SOLN
INTRAMUSCULAR | Status: DC | PRN
  Administered 2015-09-14 (×2): 50 ug via INTRAVENOUS

## 2015-09-14 MED ORDER — NITROGLYCERIN IN D5W 200-5 MCG/ML-% IV SOLN
0.0000 ug/min | INTRAVENOUS | Status: DC
  Administered 2015-09-14: 40 ug/min via INTRAVENOUS
  Filled 2015-09-14: qty 250

## 2015-09-14 SURGICAL SUPPLY — 27 items
BALLN ANGIOSCULPT RX 3.0X6 (BALLOONS) ×3
BALLN EMERGE MR 2.5X12 (BALLOONS) ×3
BALLN ~~LOC~~ EUPHORA RX 3.25X12 (BALLOONS) ×3
BALLOON ANGIOSCULPT RX 3.0X6 (BALLOONS) IMPLANT
BALLOON EMERGE MR 2.5X12 (BALLOONS) IMPLANT
BALLOON ~~LOC~~ EUPHORA RX 3.25X12 (BALLOONS) IMPLANT
CATH IMPULSE 5F ANG/FL3.5 (CATHETERS) ×2 IMPLANT
CATH INFINITI 5FR JL4 (CATHETERS) ×2 IMPLANT
CATH S G BIP PACING (SET/KITS/TRAYS/PACK) IMPLANT
CATH VISTA GUIDE 6FR XBLAD3.0 (CATHETERS) ×2 IMPLANT
CATH VISTA GUIDE 6FR XBLAD3.5 (CATHETERS) ×2 IMPLANT
DEVICE RAD COMP TR BAND LRG (VASCULAR PRODUCTS) ×3 IMPLANT
DEVICE WIRE ANGIOSEAL 6FR (Vascular Products) ×2 IMPLANT
ELECT DEFIB PAD ADLT CADENCE (PAD) ×2 IMPLANT
GLIDESHEATH SLEND A-KIT 6F 22G (SHEATH) ×3 IMPLANT
HOVERMATT SINGLE USE (MISCELLANEOUS) ×2 IMPLANT
KIT ENCORE 26 ADVANTAGE (KITS) ×2 IMPLANT
KIT HEART LEFT (KITS) ×3 IMPLANT
PACK CARDIAC CATHETERIZATION (CUSTOM PROCEDURE TRAY) ×3 IMPLANT
SHEATH PINNACLE 6F 10CM (SHEATH) ×2 IMPLANT
STENT SYNERGY DES 3X16 (Permanent Stent) ×2 IMPLANT
TRANSDUCER W/STOPCOCK (MISCELLANEOUS) ×3 IMPLANT
TUBING CIL FLEX 10 FLL-RA (TUBING) ×3 IMPLANT
WIRE ASAHI PROWATER 180CM (WIRE) ×2 IMPLANT
WIRE COUGAR XT STRL 190CM (WIRE) ×2 IMPLANT
WIRE EMERALD 3MM-J .035X150CM (WIRE) ×2 IMPLANT
WIRE SAFE-T 1.5MM-J .035X260CM (WIRE) ×3 IMPLANT

## 2015-09-14 NOTE — Progress Notes (Signed)
CRITICAL VALUE ALERT  Critical value received:  Trop 20.45 Date of notification:  09/14/15  Time of notification:  1213  Critical value read back:Yes.    Nurse who received alert: Bronson Curb RN  MD notified (1st page):  Dr. Katrinka Blazing Time of first page:  1214  Time MD responded:  1215  Expected value. No new orders given.

## 2015-09-14 NOTE — Progress Notes (Signed)
Pt called RN to bedside c/o of 8/10 CP which was previous 4-5/10. Pt holding chest and wincing. When asked to describe pain pt replied "it just hurts over here on my chest." Pt pointed to right side of upper chest. Nitro gtt increased from to . EKG obtained. Morphine  given. Pain 5/10. On call MD called. Dr. Bishop Limbo made aware. Orders given to continue current interventions. Will continue to watch patient closely.

## 2015-09-14 NOTE — Progress Notes (Signed)
MD on call for Cardiology, Dr.  Diona Browner made aware of pt Troponin >65. Pt is asleep, no further orders at this time. Will continue to monitor closely.

## 2015-09-14 NOTE — Progress Notes (Signed)
Arrived to Holding w/NTG at and Cangrelor at 143cc/hr. Post 12 Lead EKG shown to Dr. Katrinka Blazing

## 2015-09-14 NOTE — H&P (Addendum)
Peter Russo is a 40 y.o. male  Admit Date: 09/14/2015 Referring Physician: None Primary Cardiologist:: Charlton Haws, MD Chief complaint / reason for admission: Acute anterior MI  HPI: 40 year old with strong family history of CAD, tobacco use, hypertension, hyperlipidemia, prior multiple coronary stents including bare-metal stent to the mid LAD 2010 drug-eluting stents to the RCA 2012 DES to the circumflex 2016 and DES overlapping the bare-metal stent in the LAD 2016.  Over the past 2 weeks he has had exertional angina. At 4:30 AM chest discomfort began and persisted until he was brought by EMS to the emergency room. He was brought immediately to the cardiac catheterization laboratory. He was having severe pain preventing him from giving history and following commands. The pain was severe. He continues to smoke cigarettes. He has not been taking his antiplatelet therapy. He is unable to afford medical therapy and has no insurance.    PMH:    Past Medical History  Diagnosis Date  . 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 GERD (gastroesophageal reflux disease)   . Essential hypertension   . Hypercholesteremia   . CAD S/P percutaneous coronary angioplasty     a. s/p BMS-mLAD 2010. b. DES to RCA 2012. c. 04/2015: DES to LCx and PCI to LAD c/b dissection with subsequent overlapping DES to mLAD - r/i for NSTEMI afterwards.  . Tobacco abuse   . GAD (generalized anxiety disorder)   . Depression   . Pre-diabetes   . Morbid obesity (HCC)     PSH:    Past Surgical History  Procedure Laterality Date  . Coronary stent placement    . Cardiac catheterization N/A 04/23/2015    Procedure: Left Heart Cath and Coronary Angiography;  Surgeon: Wendall Stade, MD;  Location: [facility] INVASIVE CV LAB;  Service: Cardiovascular;  Laterality: N/A;  . Cardiac catheterization N/A 04/23/2015    Procedure: Coronary Stent Intervention;   Surgeon: Tonny Bollman, MD;  Location: [facility] INVASIVE CV LAB;  Service: Cardiovascular;  Laterality: N/A;   ALLERGIES:   Review of patient's allergies indicates no known allergies. Prior to Admit Meds:   Prescriptions prior to admission  Medication Sig Dispense Refill Last Dose  . ALPRAZolam (XANAX) 0.5 MG tablet Take 1 tablet (0.5 mg total) by mouth 2 (two) times daily as needed for anxiety. 30 tablet 0 Past Week at Unknown time  . aspirin EC 81 MG tablet Take 81 mg by mouth daily.   09/14/2015 at Unknown time  . atorvastatin (LIPITOR) 80 MG tablet Take 1 tablet (80 mg total) by mouth daily at 6 PM. 90 tablet 3 09/13/2015 at Unknown time  . clopidogrel (PLAVIX) 75 MG tablet Take 1 tablet (75 mg total) by mouth daily with breakfast. 30 tablet 11 Past Month at Unknown time  . lisinopril (PRINIVIL,ZESTRIL) 5 MG tablet Take 1 tablet (5 mg total) by mouth daily. 30 tablet 5 09/13/2015 at Unknown time  . ezetimibe (ZETIA) 10 MG tablet Take 1 tablet (10 mg total) by mouth daily. (Patient not taking: Reported on 09/14/2015) 90 tablet 3 Not Taking at Unknown time  . fenofibrate (TRICOR) 48 MG tablet Take 1 tablet (48 mg total) by mouth daily. (Patient not taking: Reported on 09/14/2015) 30 tablet 11 Not Taking at Unknown time  . isosorbide mononitrate (IMDUR) 30 MG 24 hr tablet Take 1 tablet (30 mg total) by mouth daily. (Patient not taking: Reported on  09/14/2015) 90 tablet 3 Not Taking at Unknown time  . metoprolol tartrate (LOPRESSOR) 25 MG tablet Take 1 tablet (25 mg total) by mouth 2 (two) times daily. (Patient not taking: Reported on 09/14/2015) 60 tablet 11 Not Taking at Unknown time  . nitroGLYCERIN (NITROSTAT) 0.4 MG SL tablet Place 0.4 mg under the tongue every 5 (five) minutes as needed for chest pain (3 x).   09/14/2015 at 0630  . sertraline (ZOLOFT) 50 MG tablet Take 1 tablet (50 mg total) by mouth daily. (Patient not taking: Reported on 09/14/2015) 30 tablet 2 Not Taking at Unknown time   Family HX:      Family History  Problem Relation Age of Onset  . Arrhythmia Mother     MOTHER LIVED TO BE 102  . Coronary artery disease Mother   . Coronary artery disease Father 24  . Heart disease Father   . Heart attack Father   . Heart attack Mother   . Heart attack Brother   . Stroke Neg Hx   . Hypertension Mother    Social HX:    Social History   Social History  . Marital Status: Single    Spouse Name: N/A  . Number of Children: N/A  . Years of Education: N/A   Occupational History  . ACCOUNTANT    Social History Main Topics  . Smoking status: Current Every Day Smoker -- 1.00 packs/day    Types: Cigarettes  . Smokeless tobacco: Not on file  . Alcohol Use: No  . Drug Use: Yes    Special: Marijuana  . Sexual Activity: Not on file   Other Topics Concern  . Not on file   Social History Narrative   REGULAR EXERCISE     ROS  Physical Exam: Blood pressure 164/102, pulse 0, resp. rate 0, SpO2 0 %.    Obese, in terrible pain writhing on the cath table. HEENT exam is remarkable for poor oral hygiene, scleral pallor, but otherwise unremarkable. No JVD is noted but difficult to assess. Carotid bruits are not heard. Carotids are 2+ and symmetric. Chest is clear to auscultation and percussion anterior.  Cardiac exam reveals distant heart sounds. No obvious gallop or murmurs heard. Abdomen is obese and nontender. Extremities reveal 2+ radial and femoral pulses. Neurological exam is grossly intact. Patient is able to move all extremities. He follows commands but is terribly distracted by the severity of pain.  Labs: Lab Results  Component Value Date   WBC 9.0 09/14/2015   HGB 14.0 09/14/2015   HCT 39.9 09/14/2015   MCV 84.4 09/14/2015   PLT 174 09/14/2015    Recent Labs Lab 09/08/15 1345 09/14/15 0757  NA  --  141  K  --  3.6  CL  --  106  BUN  --  13  CREATININE  --  1.00  PROT 7.5  --   BILITOT 0.5  --   ALKPHOS 85  --   ALT 22  --   AST 19  --   GLUCOSE  --   135*   Lab Results  Component Value Date   CKTOTAL 822* 04/23/2015   CKMB 65.3* 04/23/2015   TROPONINI * 11/01/2010    7.27        POSSIBLE MYOCARDIAL ISCHEMIA. SERIAL TESTING RECOMMENDED. CRITICAL VALUE NOTED.  VALUE IS CONSISTENT WITH PREVIOUSLY REPORTED AND CALLED VALUE.      Radiology:  none done   EKG:   ST elevation anteriorly the 3 through V6.  ASSESSMENT:  1. Relatively late presenting anterior ST elevation myocardial infarction with suspicion of stent thrombosis because patient has not been taking dual antiplatelet therapy. 2. Obesity 3. Hyperlipidemia 4. Diabetes mellitus, type II  Plan:  1. Emergency catheterization and revascularization is recommended and the patient accepts   Time spent in direct patient care in preparation for catheterization and any evaluation in critical care setting was 35 minutes.  Lesleigh Noe 09/14/2015 9:16 AM

## 2015-09-14 NOTE — Care Management Note (Signed)
Case Management Note  Patient Details  Name: Peter Russo MRN: [id number] Date of Birth: 09/04/1975  Subjective/Objective:     Adm w mi             Action/Plan: lives at home   Expected Discharge Date:                  Expected Discharge Plan:  Home/Self Care  In-House Referral:     Discharge planning Services  CM Consult, Medication Assistance  Post Acute Care Choice:    Choice offered to:     DME Arranged:    DME Agency:     HH Arranged:    HH Agency:     Status of Service:     Medicare Important Message Given:    Date Medicare IM Given:    Medicare IM give by:    Date Additional Medicare IM Given:    Additional Medicare Important Message give by:     If discussed at Long Length of Stay Meetings, dates discussed:    Additional Comments: ur review done, left pt 30day free brilinta card. Pt assist form on shadow chart. Chart states pcp dr Holland Commons.  Hanley Hays, RN 09/14/2015, 10:08 AM

## 2015-09-14 NOTE — Progress Notes (Signed)
CRITICAL VALUE ALERT  Critical value received: INR 5.48 and Trop 1.85  Date of notification:  09/14/15  Time of notification:  1025  Critical value read back:Yes.    Nurse who received alert:  Ella Jubilee RN and Bronson Curb RN  MD notified (1st page):  Dr. Verdis Prime Time of first page:  1026  Expected values post Cardiac Cath. No new orders given.

## 2015-09-14 NOTE — Progress Notes (Signed)
   SUBJECTIVE:  Called to se paitent urgently for 9/10 chest pain post PCI for anterior STEMI due to stent thrombosis of previously placed LAD stents.  OBJECTIVE:   Vitals:   Filed Vitals:   09/14/15 0935 09/14/15 0952 09/14/15 1000 09/14/15 1015  BP: 152/80 146/95 151/92 155/96  Pulse: 69  66 76  Resp: 15 25    Height:   (1.676 m)    Weight:  260 lb 2.3 oz (118 kg)    SpO2: 100%  100% 100%   I&O's:  No intake or output data in the 24 hours ending 09/14/15 1038 TELEMETRY: Reviewed telemetry pt in NSR:     PHYSICAL EXAM General: Appears uncomfortable Head:   Normal cephalic and atramatic  Lungs:  Clear bilaterally to auscultation. Heart:  HRRR S1 S2  No JVD.   Abdomen: abdomen soft and non-tender Msk:  Back normal,  Normal strength and tone for age. Extremities:   No edema. Right groin stable  Neuro: Alert and oriented. Psych:  Normal affect, responds appropriately Skin: No rash   LABS: Basic Metabolic Panel:  Recent Labs  08/16/70 0757 09/14/15 0823  NA 141 137  K 3.6 3.6  CL 106 107  CO2  --  20*  GLUCOSE 135* 132*  BUN 13 11  CREATININE 1.00 1.13  CALCIUM  --  8.4*   Liver Function Tests:  Recent Labs  09/14/15 0823  AST 46*  ALT 22  ALKPHOS 72  BILITOT 0.4  PROT 6.3*  ALBUMIN 3.3*   No results for input(s): LIPASE, AMYLASE in the last 72 hours. CBC:  Recent Labs  09/14/15 0757 09/14/15 0823  WBC  --  9.0  HGB 15.0 14.0  HCT 44.0 39.9  MCV  --  84.4  PLT  --  174   Cardiac Enzymes:  Recent Labs  09/14/15 0823  CKTOTAL 897*  CKMB 41.2*  TROPONINI 1.85*   BNP: Invalid input(s): POCBNP D-Dimer: No results for input(s): DDIMER in the last 72 hours. Hemoglobin A1C: No results for input(s): HGBA1C in the last 72 hours. Fasting Lipid Panel:  Recent Labs  09/14/15 0823  CHOL 215*  HDL 30*  LDLCALC 139*  TRIG 228*  CHOLHDL 7.2   Thyroid Function Tests: No results for input(s): TSH, T4TOTAL, T3FREE, THYROIDAB in the  last 72 hours.  Invalid input(s): FREET3 Anemia Panel: No results for input(s): VITAMINB12, FOLATE, FERRITIN, TIBC, IRON, RETICCTPCT in the last 72 hours. Coag Panel:   Lab Results  Component Value Date   INR 5.48* 09/14/2015   INR 0.99 04/17/2015   INR 0.97 11/01/2010    RADIOLOGY: No results found.    ASSESSMENT: Roosvelt Harps:    I personally reviewed his cath films and discussed the case with Dr. Katrinka Blazing who performed the PCI.  Patient improved with 2 mg Morphine.  THere is residual dissection in a moderately sized diagonal vessel.  ECG improved.  Doubt acute stent thrombosis of the LAD.  HE was treated with Cangrelor and Brilinta.  Pain started back before cangrelor was finished.  Will start IV tirofiban.  Will have to keep check of right groin.  Long term compliance with DAPT will be the biggest issue for him.    Critical care time 40 minutes  Corky Crafts, MD  09/14/2015  10:38 AM

## 2015-09-15 DIAGNOSIS — I255 Ischemic cardiomyopathy: Secondary | ICD-10-CM

## 2015-09-15 LAB — COMPREHENSIVE METABOLIC PANEL
ALT: 36 U/L (ref 17–63)
AST: 119 U/L — ABNORMAL HIGH (ref 15–41)
Albumin: 3.1 g/dL — ABNORMAL LOW (ref 3.5–5.0)
Alkaline Phosphatase: 63 U/L (ref 38–126)
Anion gap: 11 (ref 5–15)
BUN: <5 mg/dL — ABNORMAL LOW (ref 6–20)
CO2: 23 mmol/L (ref 22–32)
Calcium: 8.7 mg/dL — ABNORMAL LOW (ref 8.9–10.3)
Chloride: 105 mmol/L (ref 101–111)
Creatinine, Ser: 1.08 mg/dL (ref 0.61–1.24)
GFR calc Af Amer: >60 mL/min (ref >60)
GFR calc non Af Amer: >60 mL/min (ref >60)
Glucose, Bld: 138 mg/dL — ABNORMAL HIGH (ref 65–99)
Potassium: 3.4 mmol/L — ABNORMAL LOW (ref 3.5–5.1)
Sodium: 139 mmol/L (ref 135–145)
Total Bilirubin: 1.1 mg/dL (ref 0.3–1.2)
Total Protein: 6.1 g/dL — ABNORMAL LOW (ref 6.5–8.1)

## 2015-09-15 LAB — CBC
HCT: 37 % — ABNORMAL LOW (ref 39.0–52.0)
Hemoglobin: 13 g/dL (ref 13.0–17.0)
MCH: 29.6 pg (ref 26.0–34.0)
MCHC: 35.1 g/dL (ref 30.0–36.0)
MCV: 84.3 fL (ref 78.0–100.0)
Platelets: 199 10*3/uL (ref 150–400)
RBC: 4.39 MIL/uL (ref 4.22–5.81)
RDW: 13.8 % (ref 11.5–15.5)
WBC: 13.1 10*3/uL — ABNORMAL HIGH (ref 4.0–10.5)

## 2015-09-15 LAB — HEMOGLOBIN A1C
Hgb A1c MFr Bld: 6.2 % — ABNORMAL HIGH (ref 4.8–5.6)
Mean Plasma Glucose: 131 mg/dL

## 2015-09-15 LAB — TROPONIN I: Troponin I: >65 ng/mL (ref <0.031)

## 2015-09-15 MED ORDER — POTASSIUM CHLORIDE CRYS ER 20 MEQ PO TBCR
40.0000 meq | EXTENDED_RELEASE_TABLET | Freq: Once | ORAL | Status: AC
  Administered 2015-09-15: 40 meq via ORAL
  Filled 2015-09-15: qty 2

## 2015-09-15 MED ORDER — HEART ATTACK BOUNCING BOOK
Freq: Once | Status: DC
  Filled 2015-09-15: qty 1

## 2015-09-15 MED FILL — Verapamil HCl IV Soln 2.5 MG/ML: INTRAVENOUS | Qty: 2 | Status: AC

## 2015-09-15 NOTE — Progress Notes (Signed)
SUBJECTIVE:  Chest pain post PCI for anterior STEMI due to stent thrombosis of previously placed LAD stents  Has resolved.  He fels much better.  He admits he was not taking his DAPT properly at home.  He states there was not a problem getting the medicine but he just was not taking it properly.   OBJECTIVE:   Vitals:   Filed Vitals:   09/15/15 0545 09/15/15 0600 09/15/15 0630 09/15/15 0700  BP: 106/73 94/59 113/69 122/79  Pulse: 77 69 70 69  Temp:      TempSrc:      Resp:      Height:      Weight:      SpO2: 97% 96% 97% 97%   I&O's:    Intake/Output Summary (Last 24 hours) at 09/15/15 0925 Last data filed at 09/15/15 0700  Gross per 24 hour  Intake 1162.91 ml  Output   2125 ml  Net -962.09 ml   TELEMETRY: Reviewed telemetry pt in NSR:     PHYSICAL EXAM General: Appears uncomfortable Head:   Normal cephalic and atramatic  Lungs:  Clear bilaterally to auscultation. Heart:  HRRR S1 S2  No JVD.   Abdomen: abdomen soft and non-tender Msk:  Back normal,  Normal strength and tone for age. Extremities:   No edema. Right groin stable, no hematoma, 2+ right DP pulse  Neuro: Alert and oriented. Psych:  Normal affect, responds appropriately Skin: No rash   LABS: Basic Metabolic Panel:  Recent Labs  47/82/95 0823 09/15/15 0338  NA 137 139  K 3.6 3.4*  CL 107 105  CO2 20* 23  GLUCOSE 132* 138*  BUN 11 <5*  CREATININE 1.13 1.08  CALCIUM 8.4* 8.7*   Liver Function Tests:  Recent Labs  09/14/15 0823 09/15/15 0338  AST 46* 119*  ALT 22 36  ALKPHOS 72 63  BILITOT 0.4 1.1  PROT 6.3* 6.1*  ALBUMIN 3.3* 3.1*   No results for input(s): LIPASE, AMYLASE in the last 72 hours. CBC:  Recent Labs  09/14/15 1820 09/15/15 0338  WBC 12.1* 13.1*  HGB 13.4 13.0  HCT 38.5* 37.0*  MCV 83.5 84.3  PLT 202 199   Cardiac Enzymes:  Recent Labs  09/14/15 0823 09/14/15 1059 09/14/15 1820 09/14/15 2321  CKTOTAL 897*  --   --   --   CKMB 41.2*  --   --   --     TROPONINI 1.85* 20.45* >65.00* >65.00*   BNP: Invalid input(s): POCBNP D-Dimer: No results for input(s): DDIMER in the last 72 hours. Hemoglobin A1C:  Recent Labs  09/14/15 0823  HGBA1C 6.2*   Fasting Lipid Panel:  Recent Labs  09/14/15 0823  CHOL 215*  HDL 30*  LDLCALC 139*  TRIG 228*  CHOLHDL 7.2   Thyroid Function Tests: No results for input(s): TSH, T4TOTAL, T3FREE, THYROIDAB in the last 72 hours.  Invalid input(s): FREET3 Anemia Panel: No results for input(s): VITAMINB12, FOLATE, FERRITIN, TIBC, IRON, RETICCTPCT in the last 72 hours. Coag Panel:   Lab Results  Component Value Date   INR 5.48* 09/14/2015   INR 0.99 04/17/2015   INR 0.97 11/01/2010    RADIOLOGY: No results found.    ASSESSMENT: Peter Russo:    I personally reviewed his cath films and discussed the case with Dr. Katrinka Blazing who performed the PCI.  THere is residual dissection in a moderately sized diagonal vessel, which has been present since late 2016 at the time.  TIMI 3 flow in that  vessel.  ECG improved.    Chest pain post procedure yesterday.  Started IV tirofiban.  Right groin stable.  Now resolved  Long term compliance with DAPT will be the biggest issue for him.  May need help from case manager regarding meds.    Will watch in ICU today.  Large anterior MI with high troponin.  Likely will have significant LV dysfunction.  Only time will tell regarding recovery.  Would have to consider Life vest in this patient as well.   He needs to stop smoking.  We discussed this at length.      Corky Crafts, MD  09/15/2015  9:25 AM

## 2015-09-15 NOTE — Progress Notes (Signed)
Pt had nose bleed, MD made aware-Dr. Sherren Kerns. Informed MD pt still on NTG gtt and aggrastat. No new orders at this time. Will continue to monitor.

## 2015-09-15 NOTE — Progress Notes (Signed)
EKG CRITICAL VALUE     12 lead EKG performed.  Critical value noted.  Lexi, RN notified.   Oda Cogan, CCT 09/15/2015 6:53 AM

## 2015-09-15 NOTE — Progress Notes (Signed)
Pt's primary care phy is dr Luna Glasgow w [facility]. He has 30day free brilinta card and enc him to call 800 number to get days free. Pt assist form for brilinta on shadow chart. Clinic will also help pt get med since they have pharmacy.

## 2015-09-16 LAB — BASIC METABOLIC PANEL
Anion gap: 9 (ref 5–15)
BUN: 12 mg/dL (ref 6–20)
CO2: 25 mmol/L (ref 22–32)
Calcium: 8.6 mg/dL — ABNORMAL LOW (ref 8.9–10.3)
Chloride: 106 mmol/L (ref 101–111)
Creatinine, Ser: 1.24 mg/dL (ref 0.61–1.24)
GFR calc Af Amer: >60 mL/min (ref >60)
GFR calc non Af Amer: >60 mL/min (ref >60)
Glucose, Bld: 97 mg/dL (ref 65–99)
Potassium: 3.4 mmol/L — ABNORMAL LOW (ref 3.5–5.1)
Sodium: 140 mmol/L (ref 135–145)

## 2015-09-16 MED ORDER — POTASSIUM CHLORIDE CRYS ER 20 MEQ PO TBCR
40.0000 meq | EXTENDED_RELEASE_TABLET | Freq: Once | ORAL | Status: AC
  Administered 2015-09-16: 40 meq via ORAL
  Filled 2015-09-16: qty 2

## 2015-09-16 NOTE — Progress Notes (Signed)
CARDIAC REHAB PHASE I   PRE:  Rate/Rhythm: 82 SR with PVC    BP: sitting 91/79    SaO2: 97 RA  MODE:  Ambulation: 700 ft   POST:  Rate/Rhythm: 105 ST    BP: sitting 120/86     SaO2:   Tolerated well, no c/o. Feels well. Began ed with pt. He is frustrated and overwhelmed. He wants to live but continuously makes the bad choice. He has never tried to quit smoking. Long time spent encouraging pt that he can make change. Mainly focused on taking meds and quitting smoking. We did briefly discuss decreasing sugars. Pt is interested in [facility] and will send referral to [place]. He sts there is no reason that he couldn't come. Will continue to follow with more education. He can walk independently. [id number]   Harriet Masson CES, ACSM 09/16/2015 10:35 AM

## 2015-09-16 NOTE — Progress Notes (Signed)
SUBJECTIVE:  Chest pain post PCI for anterior STEMI due to stent thrombosis of previously placed LAD stents.  He reports his chest pain has completely resolved. Denies any SOB. He states he was not taking his DAPT because he would forget sometimes and then had trouble affording it as well.   OBJECTIVE:   Vitals:   Filed Vitals:   09/15/15 2100 09/16/15 0000 09/16/15 0400 09/16/15 0500  BP: 100/85 102/49  103/71  Pulse: 92 79 73 72  Temp: 98.5 F (36.9 C) 99.1 F (37.3 C)  98.7 F (37.1 C)  TempSrc: Oral Oral  Oral  Resp:      Height:      Weight:      SpO2: 94% 99% 94% 98%   I&O's:    Intake/Output Summary (Last 24 hours) at 09/16/15 0731 Last data filed at 09/15/15 2200  Gross per 24 hour  Intake    870 ml  Output   2275 ml  Net  -1405 ml   TELEMETRY: Reviewed telemetry pt in NSR:   PHYSICAL EXAM General: resting in bed, NAD HEENT: [place]/AT, EOMI, sclera anicteric, mucus membranes moist   CV: RRR, no m/g/r Pulm: CTA bilaterally, breaths non-labored Abd: BS+, soft, non-tender Extremities:   No peripheral edema. Right groin stable, no hematoma, 2+ right DP pulse  Neuro: Alert and oriented x 3 Psych:  Normal affect, responds appropriately Skin: No rash   LABS: Basic Metabolic Panel:  Recent Labs  16/10/96 0338 09/16/15 0250  NA 139 140  K 3.4* 3.4*  CL 105 106  CO2 23 25  GLUCOSE 138* 97  BUN <5* 12  CREATININE 1.08 1.24  CALCIUM 8.7* 8.6*   Liver Function Tests:  Recent Labs  09/14/15 0823 09/15/15 0338  AST 46* 119*  ALT 22 36  ALKPHOS 72 63  BILITOT 0.4 1.1  PROT 6.3* 6.1*  ALBUMIN 3.3* 3.1*   CBC:  Recent Labs  09/14/15 1820 09/15/15 0338  WBC 12.1* 13.1*  HGB 13.4 13.0  HCT 38.5* 37.0*  MCV 83.5 84.3  PLT 202 199   Cardiac Enzymes:  Recent Labs  09/14/15 0823 09/14/15 1059 09/14/15 1820 09/14/15 2321  CKTOTAL 897*  --   --   --   CKMB 41.2*  --   --   --   TROPONINI 1.85* 20.45* >65.00* >65.00*   Hemoglobin  A1C:  Recent Labs  09/14/15 0823  HGBA1C 6.2*   Fasting Lipid Panel:  Recent Labs  09/14/15 0823  CHOL 215*  HDL 30*  LDLCALC 139*  TRIG 228*  CHOLHDL 7.2   Coag Panel:   Lab Results  Component Value Date   INR 5.48* 09/14/2015   INR 0.99 04/17/2015   INR 0.97 11/01/2010    RADIOLOGY: No results found.    ASSESSMENT/PLAN:    Large Anterior MI with High Troponin: Secondary to occlusion of the mid LAD within a previously placed bare metal stent and medication non-compliance with his DAPT. Cath performed on 1/30 with restenting of the mid LAD. He will likely have significant LV dysfunction given his history of multiple MIs. No signs of volume overload currently. He has significant risk factors for CAD including HTN, prediabetes (A1c 6.2), hyperlipidemia, and smoking. We discussed working on these risk factors including weight loss and smoking cessation. We also discussed the importance of taking his medications to prevent future cardiovascular events.  - Continue Brilinta and ASA - Continue Lisinopril 5 mg daily - Continue Lipitor 80 mg daily -  Continue Lopressor 25 mg BID - Potassium 3.4 today, repleted - Continue to encourage smoking cessation - Case manager consulted for medication assistance- he was given a 30 day free brilinta card and a number to receive an additional 60 day supply for free - Cr slightly bumped today at 1.24. Will monitor closely.    Attending note to follow.   Rich Number, MD, MPH Internal Medicine Resident, PGY-II Pager: [phone] 09/16/2015  7:31 AM   I have examined the patient and reviewed assessment and plan and discussed with patient.  Agree with above as stated.  Patient is having a tough time comprehending the extent of his CAD.  Cardiac rehab walked with him and he had no problems. Check echo today.  Replace K. Consider life vest depending on echo result.  No heart failure sx at this time despite large anterior MI. Transfer to tele.    Beverley Allender S.

## 2015-09-16 NOTE — Progress Notes (Signed)
Educated patient on following a low sodium/low fat diet, incorporating exercise, and the importance of understanding and taking medications regularly. Wife and daughter present and interested. Gave patient nutritional information for [facility] and [facility] Ordered Heart Attack Bouncing Back book.

## 2015-09-17 ENCOUNTER — Inpatient Hospital Stay (HOSPITAL_COMMUNITY): Payer: Self-pay

## 2015-09-17 DIAGNOSIS — I509 Heart failure, unspecified: Secondary | ICD-10-CM

## 2015-09-17 MED ORDER — POTASSIUM CHLORIDE CRYS ER 20 MEQ PO TBCR
40.0000 meq | EXTENDED_RELEASE_TABLET | Freq: Once | ORAL | Status: AC
  Administered 2015-09-17: 40 meq via ORAL
  Filled 2015-09-17: qty 2

## 2015-09-17 MED ORDER — PERFLUTREN LIPID MICROSPHERE
1.0000 mL | INTRAVENOUS | Status: AC | PRN
  Filled 2015-09-17: qty 10

## 2015-09-17 MED ORDER — PERFLUTREN LIPID MICROSPHERE
INTRAVENOUS | Status: AC
  Administered 2015-09-17: 4 mL
  Filled 2015-09-17: qty 10

## 2015-09-17 NOTE — Care Management Note (Addendum)
Case Management Note  Patient Details  Name: DAWAN FARNEY MRN: [id number] Date of Birth: [birth year]  Original Note provided by Dorcas Carrow  Subjective/Objective:     Adm w mi             Action/Plan: lives at home   Expected Discharge Date:                  Expected Discharge Plan:  Home/Self Care  In-House Referral:     Discharge planning Services  CM Consult, Medication Assistance  Post Acute Care Choice:    Choice offered to:     DME Arranged:    DME Agency:     HH Arranged:    HH Agency:     Status of Service:     Medicare Important Message Given:    Date Medicare IM Given:    Medicare IM give by:    Date Additional Medicare IM Given:    Additional Medicare Important Message give by:     If discussed at Long Length of Stay Meetings, dates discussed:    09/17/2015 CM arranged follow up appt at [facility] for 09/24/15 at 3:30 pm.  CM will communicate to pt appt time and date, will also provide on AVS.  CM verified that pt has free 30 day Brilinta Card and pt was instructed to go to [facility] on [address] and ask for pharmacy to process.  Additional Comments: ur review done, left pt 30day free brilinta card. Pt assist form on shadow chart. Chart states pcp dr Holland Commons.  Cherylann Parr, RN 09/17/2015, 4:35 PM

## 2015-09-17 NOTE — Progress Notes (Signed)
CARDIAC REHAB PHASE I   PRE:  Rate/Rhythm: 70 SR    BP: sitting 130/80    SaO2:   MODE:  Ambulation: 900 ft   POST:  Rate/Rhythm: 101 ST    BP: sitting 160/90     SaO2:   Tolerated walk well, no c/o. BP higher today. Discussed low sodium in depth and ex gl. Voiced understanding and asking many questions about restaurants and balancing sodium. Also reviewed NTG. Pt has not had echo yet, even though his location says "echo lab" for today. Will await echo to discuss HF. Pt is starting to think about making good choices. [id number]  Elissa Lovett [place] CES, ACSM 09/17/2015 11:49 AM

## 2015-09-17 NOTE — Progress Notes (Signed)
Echocardiogram 2D Echocardiogram has been performed.  Peter Russo 09/17/2015, 3:44 PM

## 2015-09-18 ENCOUNTER — Encounter: Payer: Self-pay | Admitting: *Deleted

## 2015-09-18 ENCOUNTER — Telehealth: Payer: Self-pay | Admitting: *Deleted

## 2015-09-18 ENCOUNTER — Encounter (HOSPITAL_COMMUNITY): Payer: Self-pay | Admitting: Physician Assistant

## 2015-09-18 ENCOUNTER — Telehealth: Payer: Self-pay | Admitting: Cardiovascular Disease

## 2015-09-18 MED ORDER — TICAGRELOR 90 MG PO TABS: 90.0000 mg | ORAL_TABLET | Freq: Two times a day (BID) | ORAL | Status: DC

## 2015-09-18 NOTE — Discharge Summary (Addendum)
CARDIOLOGY DISCHARGE SUMMARY   Patient ID: Peter Russo MRN: [id number] DOB/AGE: [birth year] 42 y.o.  Admit date: 09/14/2015 Discharge date: 09/18/2015  PCP: Ambrose Finland, NP Primary Cardiologist: Dr Eden Emms  Primary Discharge Diagnosis:  ST elevation (STEMI) myocardial infarction involving left anterior descending coronary artery (HCC) - s/p 3.0 x 16 mm Synergy DES Secondary Discharge Diagnosis:    Morbid obesity (HCC)   Pre-diabetes   Tobacco abuse   Essential hypertension   Ischemic chest pain (HCC)   Ischemic cardiomyopathy - EF 25-35 percent at cath, 35-40 percent by echo   Procedures: React catheterization, coronary arteriogram, left ventriculogram, Angiosculpt scoring balloon PTCA and DES to the LAD, 2-D echocardiogram  Hospital Course: Peter Russo is a 40 y.o. male with a history of FH CAD, tobacco use, hypertension, hyperlipidemia, prior multiple coronary stents including bare-metal stent to the mid LAD 2010 drug-eluting stents to the RCA 2012 (RCA now occluded), DES to the CFX and DES overlapping the BMS in the LAD 04/2015.  He was not compliant with antiplatelet therapy because of financial issues. He was having exertional angina for 2 weeks. On the day of admission, the chest pain started at 4:30 AM and was unrelenting. EMS activated code STEMI and he was taken directly to the Cath Lab.  Cardiac catheterization results are below. His RCA is chronically occluded. There was no significant in-stent restenosis of the circumflex. The LAD was occluded. This was in-stent restenosis of the previously placed bare metal stent. This was treated with Angiosculpt scoring balloon and a drug-eluting stent. He tolerated the procedure well.  He was seen by cardiac rehabilitation and educated on MI restrictions, heart-healthy lifestyle modifications and exercise guidelines. He was seen by case management and has an appointment for primary care in the near future. The importance  of compliance with medications was emphasized repeatedly. The patient is being given assistance by manufacturer to obtain Brilinta. He is aware that if he is going to be having trouble obtaining it, he needs to give [place] a call. He was started on other medications and his blood pressure and heart rate were well controlled.  Because of his ischemic cardiomyopathy, he will be asked to get daily weights. He is on a beta blocker and an ACE inhibitor. His heart rate was in the 50s but he was asymptomatic with this. He is also on Imdur. His Lipitor, Zetia and TriCor are resumed also.   On 09/18/2015, he was seen by Dr. Abe People and all data were reviewed. He was ambulating without chest pain or shortness of breath. No further inpatient workup is indicated and he is considered stable for discharge, to follow-up as an outpatient.   BP 102/55 mmHg  Pulse 53  Temp(Src) 98.2 F (36.8 C) (Oral)  Resp 18  Ht  (1.676 m)  Wt 260 lb 2.3 oz (118 kg)  BMI 42.01 kg/m2  SpO2 100% General: Well developed, well nourished, male in no acute distress Head: Eyes PERRLA, No xanthomas.   Normocephalic and atraumatic  Lungs: Clear bilaterally to auscultation. Heart: HRRR S1 S2, without MRG.  Pulses are 2+ & equal. No carotid bruit. No JVD. Abdomen: Bowel sounds are present, abdomen soft and non-tender without masses or  hernias noted. Msk: Normal strength and tone for age. Extremities: No clubbing, cyanosis or edema.    Skin:  No rashes or lesions noted. Neuro: Alert and oriented X 3. Psych:  Good affect, responds appropriately  Labs:   Lab Results  Component Value Date   WBC 13.1* 09/15/2015   HGB 13.0 09/15/2015   HCT 37.0* 09/15/2015   MCV 84.3 09/15/2015   PLT 199 09/15/2015     Recent Labs Lab 09/15/15 0338 09/16/15 0250  NA 139 140  K 3.4* 3.4*  CL 105 106  CO2 23 25  BUN <5* 12  CREATININE 1.08 1.24  CALCIUM 8.7* 8.6*  PROT 6.1*  --   BILITOT 1.1  --   ALKPHOS 63  --   ALT 36  --     AST 119*  --   GLUCOSE 138* 97   TROPONIN I  Date Value Ref Range Status  09/14/2015 >65.00* <0.031 ng/mL Final  09/14/2015 >65.00* <0.031 ng/mL Final  09/14/2015 20.45* <0.031 ng/mL Final  09/14/2015 1.85* <0.031 ng/mL Final   Lipid Panel     Component Value Date/Time   CHOL 215* 09/14/2015 0823   TRIG 228* 09/14/2015 0823   HDL 30* 09/14/2015 0823   CHOLHDL 7.2 09/14/2015 0823   VLDL 46* 09/14/2015 0823   LDLCALC 139* 09/14/2015 0823   B NATRIURETIC PEPTIDE  Date/Time Value Ref Range Status  09/14/2015 10:59 AM 40.7 0.0 - 100.0 pg/mL Final    Cardiac Cath: 01/302017 1. Prox Cx to Mid Cx lesion, 10% stenosed. The lesion was previously treated with a stent (unknown type). 2. Prox LAD lesion, 100% stenosed. 3. Prox RCA lesion, 100% stenosed. 4. Prox LAD to Dist LAD lesion, 100% stenosed. Post intervention, there is a 0% residual stenosis. The lesion was previously treated with a stent (unknown type).  Acute anterior myocardial infarction due to occlusion of the mid LAD within a previously placed bare metal stent. The feel based upon device feedback was that there was restenosis with ultimate acute occlusion within the stent.   Successful PTCA and restenting of the mid LAD from 100% to 0% with TIMI grade 3 flow, with Angiosculpt scoring balloon and 3.0 x 16 mm Synergy DES. Further mid and distal LAD previously stented in September were widely patent.  Circumflex stent placed in September is widely patent.  Right coronary is chronically occluded and fills by left to right collaterals  Left ventricular systolic dysfunction EF 25-35% RECOMMENDATIONS:   Long-term dual antiplatelet therapy.   Will need social services help .  Heart failure therapy to include beta blocker and ACE inhibitor therapyy  Watch for CHF  Will need 4-5 days in hospital before discharge to workout social issues and adjust medications.  EKG: 09/14/2015 SR, rate 83 Anterolateral ST  elevation>>STEMI  Echo: 09/17/2015 - Left ventricle: The cavity size was mildly dilated. Wall thickness was normal. Systolic function was moderately reduced. The estimated ejection fraction was in the range of 35% to 40%. There is akinesis of the apical myocardium. Features are consistent with a pseudonormal left ventricular filling pattern, with concomitant abnormal relaxation and increased filling pressure (grade 2 diastolic dysfunction). Impressions: - Technically difficult; definity used; apical akinesis with overall moderately reduced LV function (EF 40); grade 2 diastolic dysfunction; trace MR and TR.  FOLLOW UP PLANS AND APPOINTMENTS No Known Allergies   Medication List    STOP taking these medications        clopidogrel 75 MG tablet  Commonly known as:  PLAVIX      TAKE these medications        ALPRAZolam 0.5 MG tablet  Commonly known as:  XANAX  Take 1 tablet (0.5 mg total) by mouth 2 (two) times daily as needed for anxiety.  aspirin EC 81 MG tablet  Take 81 mg by mouth daily.     atorvastatin 80 MG tablet  Commonly known as:  LIPITOR  Take 1 tablet (80 mg total) by mouth daily at 6 PM.     ezetimibe 10 MG tablet  Commonly known as:  ZETIA  Take 1 tablet (10 mg total) by mouth daily.     fenofibrate 48 MG tablet  Commonly known as:  TRICOR  Take 1 tablet (48 mg total) by mouth daily.     isosorbide mononitrate 30 MG 24 hr tablet  Commonly known as:  IMDUR  Take 1 tablet (30 mg total) by mouth daily.     lisinopril 5 MG tablet  Commonly known as:  PRINIVIL,ZESTRIL  Take 1 tablet (5 mg total) by mouth daily.     metoprolol tartrate 25 MG tablet  Commonly known as:  LOPRESSOR  Take 1 tablet (25 mg total) by mouth 2 (two) times daily.     nitroGLYCERIN 0.4 MG SL tablet  Commonly known as:  NITROSTAT  Place 0.4 mg under the tongue every 5 (five) minutes as needed for chest pain (3 x).     sertraline 50 MG tablet  Commonly known  as:  ZOLOFT  Take 1 tablet (50 mg total) by mouth daily.     ticagrelor 90 MG Tabs tablet  Commonly known as:  BRILINTA  Take 1 tablet (90 mg total) by mouth 2 (two) times daily.        Discharge Instructions    Amb Referral to Cardiac Rehabilitation    Complete by:  As directed   Diagnosis:   Myocardial Infarction PCI       Diet - low sodium heart healthy    Complete by:  As directed   Low carbohydrate diet     Increase activity slowly    Complete by:  As directed           Follow-up Information    Follow up with [facility] On 09/24/2015.   Why:  at 3:30pm   Contact information:   [address] [address] [phone]      Follow up with Jacolyn Reedy, PA-C On 09/28/2015.   Specialty:  Cardiology   Why:  See provider at 8:15 am, please arrive 15 minutes early for paperwork.   Contact information:   [address] [place] [address] [phone]       BRING ALL MEDICATIONS WITH YOU TO FOLLOW UP APPOINTMENTS  Time spent with patient to include physician time: 41 min Signed: Theodore Demark, PA-C 09/18/2015, 9:20 AM Co-Sign MD  I have examined the patient and reviewed assessment and plan and discussed with patient.  Agree with above as stated.  S/p LAD stent thrombosis.  RRR, S1S2.  No signs of fluid overload.  Did well walking.  EF 35-40% on yesterday's echo.  Significant recovery of LV function.  Would not plan on Life vest.  Stressed the importance of medication compliance.  He understands the importance and states that "God is not going to give me many more chances."  He understands that it is very important for him to take care of himself.  We spoke about decreasing fast food intake.  Card for Brilinta given.  He will call the office if he cannot get his medicines.  He is going to stop smoking as recommended.  Chronic systolic heart failure with prior CAD in the past.  No overt heart  failure sx at this time.   Genessis Flanary S.

## 2015-09-18 NOTE — Progress Notes (Signed)
Discussed HF and daily wts. Gave booklet. Reviewed diet, pt asking good questions regarding sodium. Needed reminder to take Brilinta x2 daily. Reviewed ed.  [id number] Ethelda Chick CES, ACSM 11:14 AM 09/18/2015

## 2015-09-18 NOTE — Discharge Instructions (Signed)
WEIGH yourself daily, call for edema or weight gain of 5 pounds in a week.  PLEASE REMEMBER TO BRING ALL OF YOUR MEDICATIONS TO EACH OF YOUR FOLLOW-UP OFFICE VISITS.  PLEASE ATTEND ALL SCHEDULED FOLLOW-UP APPOINTMENTS.   Activity: Increase activity slowly as tolerated. You may shower, but no soaking baths (or swimming) for 1 week. No driving for 1 week. No lifting over 5 lbs for 2 weeks. No sexual activity for 1 week.   You May Return to Work: in 3 weeks (if applicable)  Wound Care: You may wash cath site gently with soap and water. Keep cath site clean and dry. If you notice pain, swelling, bleeding or pus at your cath site, please call [phone].    Cardiac Cath Site Care Refer to this sheet in the next few weeks. These instructions provide you with information on caring for yourself after your procedure. Your caregiver may also give you more specific instructions. Your treatment has been planned according to current medical practices, but problems sometimes occur. Call your caregiver if you have any problems or questions after your procedure. HOME CARE INSTRUCTIONS  You may shower 24 hours after the procedure. Remove the bandage (dressing) and gently wash the site with plain soap and water. Gently pat the site dry.   Do not apply powder or lotion to the site.   Do not sit in a bathtub, swimming pool, or whirlpool for 5 to 7 days.   No bending, squatting, or lifting anything over 10 pounds (4.5 kg) as directed by your caregiver.   Inspect the site at least twice daily.   Do not drive home if you are discharged the same day of the procedure. Have someone else drive you.   You may drive 24 hours after the procedure unless otherwise instructed by your caregiver.  What to expect:  Any bruising will usually fade within 1 to 2 weeks.   Blood that collects in the tissue (hematoma) may be painful to the touch. It should usually decrease in size and tenderness within 1 to 2 weeks.  SEEK  IMMEDIATE MEDICAL CARE IF:  You have unusual pain at the site or down the affected limb.   You have redness, warmth, swelling, or pain at the site.   You have drainage (other than a small amount of blood on the dressing).   You have chills.   You have a fever or persistent symptoms for more than 72 hours.   You have a fever and your symptoms suddenly get worse.   Your leg becomes pale, cool, tingly, or numb.   You have heavy bleeding from the site. Hold pressure on the site.  Document Released: 09/03/2010 Document Revised: 07/21/2011 Document Reviewed:

## 2015-09-18 NOTE — Telephone Encounter (Signed)
New message     Patient just recent discharge from hospital with another MI . Need a letter from his Physician stating his condition and unable to work applying for emergency food stamps

## 2015-09-18 NOTE — Telephone Encounter (Signed)
[facility] to see if we had any brilinta that we could provide the patient. They are going to submit patient assistance forms for him, but stated that it will take up to eight weeks to get an answer. Samples placed at the front desk.

## 2015-09-18 NOTE — Progress Notes (Signed)
Pt. Discharged to home  Discharge information reviewed and given All personal belongings given to Pt.  Education discussed with teach back IV was d/c intact upon removal Tele d/c

## 2015-09-21 ENCOUNTER — Ambulatory Visit (HOSPITAL_COMMUNITY): Payer: Self-pay

## 2015-09-22 ENCOUNTER — Ambulatory Visit (INDEPENDENT_AMBULATORY_CARE_PROVIDER_SITE_OTHER): Payer: Self-pay | Admitting: Physician Assistant

## 2015-09-22 ENCOUNTER — Ambulatory Visit (HOSPITAL_COMMUNITY): Payer: Self-pay

## 2015-09-22 ENCOUNTER — Encounter: Payer: Self-pay | Admitting: Physician Assistant

## 2015-09-22 VITALS — BP 124/64 | HR 79 | Ht 66.0 in | Wt 261.0 lb

## 2015-09-22 DIAGNOSIS — I255 Ischemic cardiomyopathy: Secondary | ICD-10-CM

## 2015-09-22 DIAGNOSIS — Z720 Tobacco use: Secondary | ICD-10-CM

## 2015-09-22 DIAGNOSIS — I10 Essential (primary) hypertension: Secondary | ICD-10-CM

## 2015-09-22 DIAGNOSIS — Z9861 Coronary angioplasty status: Secondary | ICD-10-CM

## 2015-09-22 DIAGNOSIS — I25709 Atherosclerosis of coronary artery bypass graft(s), unspecified, with unspecified angina pectoris: Secondary | ICD-10-CM

## 2015-09-22 DIAGNOSIS — I2510 Atherosclerotic heart disease of native coronary artery without angina pectoris: Secondary | ICD-10-CM

## 2015-09-22 MED ORDER — LISINOPRIL 5 MG PO TABS: 5.0000 mg | ORAL_TABLET | Freq: Every day | ORAL | Status: DC

## 2015-09-22 NOTE — Assessment & Plan Note (Signed)
Exercise and weight loss program recommended. 

## 2015-09-22 NOTE — Progress Notes (Signed)
Cardiology Office Note   Date:  09/22/2015   ID:  Peter Russo, DOB 04-Aug-1976, MRN [id number]  PCP:  Ambrose Finland, NP  Cardiologist:   Dr. Eden Emms  Chief Complaint: post hospital follow-up    History of Present Illness: Peter Russo is a 40 y.o. male who presents for  Post hospital follow-up. Patient was admitted  09/14/15 with a STEMI due to occlusion of the mid LAD within a previously placed BMS. He underwent successful PTCA and restenting of the mid LAD. The distal LAD previously stented was widely patent , circumflex stent widely patent , RCA chronically occluded fills by left to right collaterals, LV systolic dysfunction EF 25-35%. Patient had been noncompliant with antiplatelet therapy because of financial issues. EF was 35-40% by 2-D echo with grade 2 diastolic dysfunction.   Patient comes in today feeling well. He is walking 15-20 minutes daily without chest pain, palpitations, dyspnea, dyspnea on exertion, dizziness or presyncope. He quit smoking and is taking all his medications. He did run out of his lisinopril but he is going to community health today to pick it up. He is following a 2 g sodium diet and cut back on eating fast foods.   Past Medical History  Diagnosis Date  . 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 GERD (gastroesophageal reflux disease)   . Essential hypertension   . Hypercholesteremia   . CAD S/P percutaneous coronary angioplasty     a. s/p BMS-mLAD 2010. b. DES to RCA 2012. c. 04/2015: DES to LCx and PCI to LAD c/b dissection with subsequent overlapping DES to mLAD - r/i for NSTEMI afterwards.  . Tobacco abuse   . GAD (generalized anxiety disorder)   . Depression   . Pre-diabetes   . Morbid obesity (HCC)   . Ischemic cardiomyopathy 09/14/2015    EF 25-35 percent at cath, 35-40 percent by echo  . ST elevation (STEMI) myocardial infarction involving left anterior descending  coronary artery (HCC) 09/14/2015    3.0 x 16 mm Synergy DES to the LAD for ISR BMS    Past Surgical History  Procedure Laterality Date  . Coronary stent placement    . Cardiac catheterization N/A 04/23/2015    Procedure: Left Heart Cath and Coronary Angiography;  Surgeon: Wendall Stade, MD;  Location: [facility] INVASIVE CV LAB;  Service: Cardiovascular;  Laterality: N/A;  . Cardiac catheterization N/A 04/23/2015    Procedure: Coronary Stent Intervention;  Surgeon: Tonny Bollman, MD;  Location: [facility] INVASIVE CV LAB;  Service: Cardiovascular;  Laterality: N/A;  . Cardiac catheterization N/A 09/14/2015    Procedure: Left Heart Cath and Coronary Angiography;  Surgeon: Lyn Records, MD;  LAD 100%, CFX 10% ISR, RCA 100% (chronic), EF 25-35%  . Cardiac catheterization N/A 09/14/2015    Procedure: Coronary Stent Intervention;  Surgeon: Lyn Records, MD;  Location: [facility] INVASIVE CV LAB;  Service: Cardiovascular;  Laterality: N/A; 3.0 x 16 mm Synergy DES LAD     Current Outpatient Prescriptions  Medication Sig Dispense Refill  . ALPRAZolam (XANAX) 0.5 MG tablet Take 1 tablet (0.5 mg total) by mouth 2 (two) times daily as needed for anxiety. 30 tablet 0  . aspirin EC 81 MG tablet Take 81 mg by mouth daily.    Marland Kitchen atorvastatin (LIPITOR) 80 MG tablet Take 1 tablet (80 mg total) by mouth daily at 6 PM. 90 tablet 3  . ezetimibe (  ZETIA) 10 MG tablet Take 1 tablet (10 mg total) by mouth daily. 90 tablet 3  . fenofibrate (TRICOR) 48 MG tablet Take 1 tablet (48 mg total) by mouth daily. 30 tablet 11  . isosorbide mononitrate (IMDUR) 30 MG 24 hr tablet Take 1 tablet (30 mg total) by mouth daily. 90 tablet 3  . lisinopril (PRINIVIL,ZESTRIL) 5 MG tablet Take 1 tablet (5 mg total) by mouth daily. 30 tablet 5  . metoprolol tartrate (LOPRESSOR) 25 MG tablet Take 1 tablet (25 mg total) by mouth 2 (two) times daily. 60 tablet 11  . nitroGLYCERIN (NITROSTAT) 0.4 MG SL tablet Place 0.4 mg under the tongue every 5 (five) minutes as  needed for chest pain (3 x).    . sertraline (ZOLOFT) 50 MG tablet Take 1 tablet (50 mg total) by mouth daily. 30 tablet 2  . ticagrelor (BRILINTA) 90 MG TABS tablet Take 1 tablet (90 mg total) by mouth 2 (two) times daily. 60 tablet 11   No current facility-administered medications for this visit.    Allergies:   Review of patient's allergies indicates no known allergies.    Social History:  The patient  reports that he has been smoking Cigarettes.  He has been smoking about 1.00 pack per day. He does not have any smokeless tobacco history on file. He reports that he uses illicit drugs (Marijuana). He reports that he does not drink alcohol.   Family History:  The patient's    family history includes Arrhythmia in his mother; Coronary artery disease in his mother; Coronary artery disease (age of onset: 46) in his father; Heart attack in his brother, father, and mother; Heart disease in his father; Hypertension in his mother. There is no history of Stroke.    ROS:  Please see the history of present illness.   Otherwise, review of systems are positive for none.   All other systems are reviewed and negative.    PHYSICAL EXAM: VS:  BP 124/64 mmHg  Pulse 79  Ht 5\' 6"  (1.676 m)  Wt 261 lb (118.389 kg)  BMI 42.15 kg/m2 , BMI Body mass index is 42.15 kg/(m^2). GEN:  Obese, in no acute distress Neck: no JVD, HJR, carotid bruits, or masses Cardiac: RRR; no murmurs,gallop, rubs, thrill or heave,  Respiratory:  clear to auscultation bilaterally, normal work of breathing GI: soft, nontender, nondistended, + BS MS: no deformity or atrophy Extremities: right groin at cath sitewithout hematoma or hemorrhage good distal pulses otherwise lower extremities without cyanosis, clubbing, edema, good distal pulses bilaterally.  Skin: warm and dry, no rash Neuro:  Strength and sensation are intact    EKG:  EKG is ordered today. The ekg ordered today demonstrates  Normal sinus rhythm with recent anterior  MI   Recent Labs: 12/26/2014: TSH 0.534 09/14/2015: B Natriuretic Peptide 40.7 09/15/2015: ALT 36; Hemoglobin 13.0; Platelets 199 09/16/2015: BUN 12; Creatinine, Ser 1.24; Potassium 3.4*; Sodium 140    Lipid Panel    Component Value Date/Time   CHOL 215* 09/14/2015 0823   TRIG 228* 09/14/2015 0823   HDL 30* 09/14/2015 0823   CHOLHDL 7.2 09/14/2015 0823   VLDL 46* 09/14/2015 0823   LDLCALC 139* 09/14/2015 0823      Wt Readings from Last 3 Encounters:  09/22/15 261 lb (118.389 kg)  09/14/15 260 lb 2.3 oz (118 kg)  09/11/15 263 lb (119.296 kg)      Other studies Reviewed: Additional studies/ records that were reviewed today include and review of the  records demonstrates:   Echo: 09/17/2015 - Left ventricle: The cavity size was mildly dilated. Wall   thickness was normal. Systolic function was moderately reduced.   The estimated ejection fraction was in the range of 35% to 40%.   There is akinesis of the apical myocardium. Features are   consistent with a pseudonormal left ventricular filling pattern,   with concomitant abnormal relaxation and increased filling   pressure (grade 2 diastolic dysfunction). Impressions: - Technically difficult; definity used; apical akinesis with   overall moderately reduced LV function (EF 40); grade 2 diastolic   dysfunction; trace MR and TR.  Cardiac Cath: 01/302017 1. Prox Cx to Mid Cx lesion, 10% stenosed. The lesion was previously treated with a stent (unknown type). 2. Prox LAD lesion, 100% stenosed. 3. Prox RCA lesion, 100% stenosed. 4. Prox LAD to Dist LAD lesion, 100% stenosed. Post intervention, there is a 0% residual stenosis. The lesion was previously treated with a stent (unknown type).   Acute anterior myocardial infarction due to occlusion of the mid LAD within a previously placed bare metal stent. The feel based upon device feedback was that there was restenosis with ultimate acute occlusion within the stent.  Successful PTCA  and restenting of the mid LAD from 100% to 0% with TIMI grade 3 flow, with Angiosculpt scoring balloon and 3.0 x 16 mm Synergy DES. Further mid and distal LAD previously stented in September were widely patent.  Circumflex stent placed in September is widely patent.  Right coronary is chronically occluded and fills by left to right collaterals  Left ventricular systolic dysfunction EF 25-35% RECOMMENDATIONS:    Long-term dual antiplatelet therapy.    Will need social services help .  Heart failure therapy to include beta blocker and ACE inhibitor therapyy  Watch for CHF  Will need 4-5 days in hospital before discharge to workout social issues and adjust medications    ASSESSMENT AND PLAN: CAD S/P percutaneous coronary angioplasty  Patient had a STEMI after stopping his antiplatelet because of financial reasons. He underwent  Successful PTCA restenting of the mid LAD with Angio-Seal skull scoring balloon and Synergy DES. Further mid and distal LAD previously stented were patent , circumflex stent was patent , RCA was chronically occluded EF 25-30% at cath. Patient is doing well taking his medications and without symptoms. Gradually increase his activities. I stressed the importance of not stopping any of his medications especially Brilinta. Early follow-up with Dr. Eden Emms in 4-6 weeks.  Essential hypertension  Blood pressure stable. Patient is out of lisinopril but he is going to community health to refill it today.  Tobacco abuse  Patient quit smoking and is commended for this.  Morbid obesity (HCC)  Exercise and weight loss program recommended  Cardiomyopathy, ischemic  Patient's EF is 25-35% at cath 35-40% on 2-D echo. No evidence of heart failure and exam. He also has grade 2 diastolic dysfunction. Continue 2 g sodium diet and daily weights.     Elson Clan, PA-C  09/22/2015 12:18 PM    [facility] [address] [place], [place], [place]   [address] Phone: [phone]; Fax: [phone]

## 2015-09-22 NOTE — Patient Instructions (Signed)
Medication Instructions:  None  Labwork: None  Testing/Procedures: None  Follow-Up: Your physician recommends that you schedule a follow-up appointment in: 4-6 weeks with Dr. Eden Emms.   Any Other Special Instructions Will Be Listed Below (If Applicable).     If you need a refill on your cardiac medications before your next appointment, please call your pharmacy.

## 2015-09-22 NOTE — Assessment & Plan Note (Signed)
Blood pressure stable. Patient is out of lisinopril but he is going to community health to refill it today.

## 2015-09-22 NOTE — Assessment & Plan Note (Signed)
Patient quit smoking and is commended for this.

## 2015-09-22 NOTE — Assessment & Plan Note (Signed)
Patient had a STEMI after stopping his antiplatelet because of financial reasons. He underwent  Successful PTCA restenting of the mid LAD with Angio-Seal skull scoring balloon and Synergy DES. Further mid and distal LAD previously stented were patent , circumflex stent was patent , RCA was chronically occluded EF 25-30% at cath. Patient is doing well taking his medications and without symptoms. Gradually increase his activities. I stressed the importance of not stopping any of his medications especially Brilinta. Early follow-up with Dr. Eden Emms in 4-6 weeks.

## 2015-09-22 NOTE — Assessment & Plan Note (Signed)
Patient's EF is 25-35% at cath 35-40% on 2-D echo. No evidence of heart failure and exam. He also has grade 2 diastolic dysfunction. Continue 2 g sodium diet and daily weights.

## 2015-09-24 ENCOUNTER — Inpatient Hospital Stay: Payer: Self-pay | Admitting: Internal Medicine

## 2015-09-28 ENCOUNTER — Ambulatory Visit: Payer: Self-pay | Admitting: Physician Assistant

## 2015-10-08 MED FILL — ?ZETIA 10 MG TABLET: 10 MG | 30 days supply | Qty: 30 | Fill #0

## 2015-10-08 MED FILL — LISINOPRIL 5 MG TABLET: 5 | 30 days supply | Qty: 30 | Fill #0

## 2015-10-08 MED FILL — ISOSORBIDE MN ER 30 MG TAB: 30 | 30 days supply | Qty: 30 | Fill #0

## 2015-10-08 MED FILL — BRILINTA 90 MG TABLET: 90 | 30 days supply | Qty: 60 | Fill #0

## 2015-10-08 MED FILL — ATORVASTATIN 80 MG TABLET: 80 | 30 days supply | Qty: 30 | Fill #0

## 2015-10-13 ENCOUNTER — Ambulatory Visit: Payer: Self-pay | Admitting: Skilled Nursing Facility1

## 2015-10-15 ENCOUNTER — Ambulatory Visit: Payer: Self-pay | Attending: Internal Medicine | Admitting: Internal Medicine

## 2015-10-15 ENCOUNTER — Encounter: Payer: Self-pay | Admitting: Internal Medicine

## 2015-10-15 VITALS — BP 127/84 | HR 84 | Temp 98.0°F | Resp 17 | Ht 66.0 in | Wt 259.0 lb

## 2015-10-15 DIAGNOSIS — I2102 ST elevation (STEMI) myocardial infarction involving left anterior descending coronary artery: Secondary | ICD-10-CM

## 2015-10-15 NOTE — Progress Notes (Signed)
Patient states he is here for follow up from the hospital Was seen for chest pains and diagnosed with a heart attack

## 2015-10-15 NOTE — Progress Notes (Signed)
Patient ID: Peter Russo, male   DOB: [birth year], 40 y.o.   MRN: [id number]  CC: HFU  HPI: Peter Russo is a 40 y.o. male here today for a follow up visit.  Patient has past medical history of HTN, CAD, GAD, MI*2, obesity. Patient went to the ER on 1/30 for complaints of chest pain and was found to have suffered a repeat MI that resulted in resenting of the mid LAD. He had a repeat Echo that was 35-40%. Patient at that time was found to be non-compliant with anti-platelet therapy. He has followed up with Cardiology since that time and has been given Brilinta, Lipitor, Zetia, and Tricor. Patient reports that since this last even he has been compliant with medication and he has dramatically changed his diet to eliminate all fast food and fired foods. He states that he has been focusing more on baked and grilled foods. He has not smoked since his hospitalization and plans to continue as long as he cans.   Patient has No headache, No chest pain, No abdominal pain - No Nausea, No new weakness tingling or numbness, No Cough - SOB.  No Known Allergies Past Medical History  Diagnosis Date  . 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 GERD (gastroesophageal reflux disease)   . Essential hypertension   . Hypercholesteremia   . CAD S/P percutaneous coronary angioplasty     a. s/p BMS-mLAD 2010. b. DES to RCA 2012. c. 04/2015: DES to LCx and PCI to LAD c/b dissection with subsequent overlapping DES to mLAD - r/i for NSTEMI afterwards.  . Tobacco abuse   . GAD (generalized anxiety disorder)   . Depression   . Pre-diabetes   . Morbid obesity (HCC)   . Ischemic cardiomyopathy 09/14/2015    EF 25-35 percent at cath, 35-40 percent by echo  . ST elevation (STEMI) myocardial infarction involving left anterior descending coronary artery (HCC) 09/14/2015    3.0 x 16 mm Synergy DES to the LAD for ISR BMS   Current Outpatient  Prescriptions on File Prior to Visit  Medication Sig Dispense Refill  . ALPRAZolam (XANAX) 0.5 MG tablet Take 1 tablet (0.5 mg total) by mouth 2 (two) times daily as needed for anxiety. 30 tablet 0  . aspirin EC 81 MG tablet Take 81 mg by mouth daily.    Marland Kitchen atorvastatin (LIPITOR) 80 MG tablet Take 1 tablet (80 mg total) by mouth daily at 6 PM. 90 tablet 3  . ezetimibe (ZETIA) 10 MG tablet Take 1 tablet (10 mg total) by mouth daily. 90 tablet 3  . fenofibrate (TRICOR) 48 MG tablet Take 1 tablet (48 mg total) by mouth daily. 30 tablet 11  . isosorbide mononitrate (IMDUR) 30 MG 24 hr tablet Take 1 tablet (30 mg total) by mouth daily. 90 tablet 3  . lisinopril (PRINIVIL,ZESTRIL) 5 MG tablet Take 1 tablet (5 mg total) by mouth daily. 90 tablet 3  . metoprolol tartrate (LOPRESSOR) 25 MG tablet Take 1 tablet (25 mg total) by mouth 2 (two) times daily. 60 tablet 11  . nitroGLYCERIN (NITROSTAT) 0.4 MG SL tablet Place 0.4 mg under the tongue every 5 (five) minutes as needed for chest pain (3 x).    . sertraline (ZOLOFT) 50 MG tablet Take 1 tablet (50 mg total) by mouth daily. 30 tablet 2  . ticagrelor (BRILINTA) 90 MG TABS tablet Take 1 tablet (90 mg total)  by mouth 2 (two) times daily. 60 tablet 11   No current facility-administered medications on file prior to visit.   Family History  Problem Relation Age of Onset  . Arrhythmia Mother     MOTHER LIVED TO BE 102  . Coronary artery disease Mother   . Coronary artery disease Father 34  . Heart disease Father   . Heart attack Father   . Heart attack Mother   . Heart attack Brother   . Stroke Neg Hx   . Hypertension Mother    Social History   Social History  . Marital Status: Single    Spouse Name: N/A  . Number of Children: N/A  . Years of Education: N/A   Occupational History  . ACCOUNTANT    Social History Main Topics  . Smoking status: Current Every Day Smoker -- 1.00 packs/day    Types: Cigarettes  . Smokeless tobacco: Not on file   . Alcohol Use: No  . Drug Use: Yes    Special: Marijuana  . Sexual Activity: Not on file   Other Topics Concern  . Not on file   Social History Narrative   REGULAR EXERCISE    Review of Systems: Constitutional: Negative for fever, chills, diaphoresis, activity change, appetite change and fatigue. HENT: Negative for ear pain, nosebleeds, congestion, facial swelling, rhinorrhea, neck pain, neck stiffness and ear discharge.  Eyes: Negative for pain, discharge, redness, itching and visual disturbance. Respiratory: Negative for cough, choking, chest tightness, shortness of breath, wheezing and stridor.  Cardiovascular: Negative for chest pain, palpitations and leg swelling. Gastrointestinal: Negative for abdominal distention. Genitourinary: Negative for dysuria, urgency, frequency, hematuria, flank pain, decreased urine volume, difficulty urinating and dyspareunia.  Musculoskeletal: Negative for back pain, joint swelling, arthralgias and gait problem. Neurological: Negative for dizziness, tremors, seizures, syncope, facial asymmetry, speech difficulty, weakness, light-headedness, numbness and headaches.  Hematological: Negative for adenopathy. Does not bruise/bleed easily. Psychiatric/Behavioral: Negative for hallucinations, behavioral problems, confusion, dysphoric mood, decreased concentration and agitation.    Objective:   Filed Vitals:   10/15/15 1133  BP: 127/84  Pulse: 84  Temp: 98 F (36.7 C)  Resp: 17    Physical Exam  Constitutional: He is oriented to person, place, and time.  Cardiovascular: Normal rate, regular rhythm and normal heart sounds.   Pulmonary/Chest: Effort normal and breath sounds normal.  Musculoskeletal: He exhibits no edema.  Neurological: He is alert and oriented to person, place, and time.  Skin: Skin is warm and dry.     Lab Results  Component Value Date   WBC 13.1* 09/15/2015   HGB 13.0 09/15/2015   HCT 37.0* 09/15/2015   MCV 84.3  09/15/2015   PLT 199 09/15/2015   Lab Results  Component Value Date   CREATININE 1.24 09/16/2015   BUN 12 09/16/2015   NA 140 09/16/2015   K 3.4* 09/16/2015   CL 106 09/16/2015   CO2 25 09/16/2015    Lab Results  Component Value Date   HGBA1C 6.2* 09/14/2015   Lipid Panel     Component Value Date/Time   CHOL 215* 09/14/2015 0823   TRIG 228* 09/14/2015 0823   HDL 30* 09/14/2015 0823   CHOLHDL 7.2 09/14/2015 0823   VLDL 46* 09/14/2015 0823   LDLCALC 139* 09/14/2015 0823       Assessment and plan:   Peter Russo was seen today for follow-up.  Diagnoses and all orders for this visit:  ST elevation (STEMI) myocardial infarction involving left anterior descending coronary artery (  HCC) I have had a long discussion with patient on importance of medication adherence given that he has now had 3 MI's. I have went over patho of medication and more specific diet changes. We discussed daily exercise and weight loss. I have congratulated him on smoking cessation and addressed tips for continued success. Stress management discussed as well.   Total time spent with patient was 25 minutes. > 50% spent counseling and educating patient.  Return in about 3 months (around 01/15/2016) for Hypertension.      Ambrose Finland, NP-C [facility] [phone] 10/15/2015, 11:52 AM

## 2015-10-23 ENCOUNTER — Other Ambulatory Visit: Payer: Self-pay

## 2015-10-23 ENCOUNTER — Encounter: Payer: Self-pay | Admitting: Cardiovascular Disease

## 2015-10-23 NOTE — Progress Notes (Signed)
Patient ID: Peter Russo, male   DOB: 07-29-1976, 40 y.o.   MRN: [id number] Cardiology Office Note   Date:  10/27/2015   ID:  Peter Russo, DOB 07-29-1976, MRN [id number]  PCP:  Ambrose FinlandValerie A Keck, NP  Cardiologist:   Dr. Eden EmmsNishan  Chief Complaint: post hospital follow-up    History of Present Illness: Peter BloomMark E Russo is a 40 y.o. male who presents for  Post hospital follow-up. Patient was admitted  09/14/15 with a STEMI due to occlusion of the mid LAD within a previously placed BMS. He underwent successful PTCA and restenting of the mid LAD. The distal LAD previously stented was widely patent , circumflex stent widely patent , RCA chronically occluded fills by left to right collaterals, LV systolic dysfunction EF 25-35%. Patient had been noncompliant with antiplatelet therapy because of financial issues. EF was 35-40% by 2-D echo with grade 2 diastolic dysfunction.   Patient comes in today feeling well. He is walking 15-20 minutes daily without chest pain does have some dyspnea and what sounds like PND/orthopnea  He quit smoking and is taking all his medications. He did run out of his lisinopril but he is going to community health today to pick it up. He is following a 2 g sodium diet and cut back on eating fast foods.   Past Medical History  Diagnosis Date  . 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 GERD (gastroesophageal reflux disease)   . Essential hypertension   . Hypercholesteremia   . CAD S/P percutaneous coronary angioplasty     a. s/p BMS-mLAD 2010. b. DES to RCA 2012. c. 04/2015: DES to LCx and PCI to LAD c/b dissection with subsequent overlapping DES to mLAD - r/i for NSTEMI afterwards.  . Tobacco abuse   . GAD (generalized anxiety disorder)   . Depression   . Pre-diabetes   . Morbid obesity (HCC)   . Ischemic cardiomyopathy 09/14/2015    EF 25-35 percent at cath, 35-40 percent by echo  . ST  elevation (STEMI) myocardial infarction involving left anterior descending coronary artery (HCC) 09/14/2015    3.0 x 16 mm Synergy DES to the LAD for ISR BMS    Past Surgical History  Procedure Laterality Date  . Coronary stent placement    . Cardiac catheterization N/A 04/23/2015    Procedure: Left Heart Cath and Coronary Angiography;  Surgeon: Wendall StadePeter C Mahkai Fangman, MD;  [facility];  Service: Cardiovascular;  Laterality: N/A;  . Cardiac catheterization N/A 04/23/2015    Procedure: Coronary Stent Intervention;  Surgeon: Tonny BollmanMichael Cooper, MD;  [facility];  Service: Cardiovascular;  Laterality: N/A;  . Cardiac catheterization N/A 09/14/2015    Procedure: Left Heart Cath and Coronary Angiography;  Surgeon: Lyn RecordsHenry W Smith, MD;  LAD 100%, CFX 10% ISR, RCA 100% (chronic), EF 25-35%  . Cardiac catheterization N/A 09/14/2015    Procedure: Coronary Stent Intervention;  Surgeon: Lyn RecordsHenry W Smith, MD;  [facility];  Service: Cardiovascular;  Laterality: N/A; 3.0 x 16 mm Synergy DES LAD     Current Outpatient Prescriptions  Medication Sig Dispense Refill  . ALPRAZolam (XANAX) 0.5 MG tablet Take 1 tablet (0.5 mg total) by mouth 2 (two) times daily as needed for anxiety. 30 tablet 0  . aspirin EC 81 MG tablet Take 81 mg by mouth daily.    Marland Kitchen. atorvastatin (LIPITOR) 80 MG tablet  Take 1 tablet (80 mg total) by mouth daily at 6 PM. 90 tablet 3  . ezetimibe (ZETIA) 10 MG tablet Take 1 tablet (10 mg total) by mouth daily. 90 tablet 3  . fenofibrate (TRICOR) 48 MG tablet Take 1 tablet (48 mg total) by mouth daily. 30 tablet 11  . isosorbide mononitrate (IMDUR) 30 MG 24 hr tablet Take 1 tablet (30 mg total) by mouth daily. 90 tablet 3  . lisinopril (PRINIVIL,ZESTRIL) 5 MG tablet Take 1 tablet (5 mg total) by mouth daily. 90 tablet 3  . metoprolol tartrate (LOPRESSOR) 25 MG tablet Take 1 tablet (25 mg total) by mouth 2 (two) times daily. 60 tablet 11  . nitroGLYCERIN  (NITROSTAT) 0.4 MG SL tablet Place 0.4 mg under the tongue every 5 (five) minutes as needed for chest pain (3 x).    . sertraline (ZOLOFT) 50 MG tablet Take 1 tablet (50 mg total) by mouth daily. 30 tablet 2  . ticagrelor (BRILINTA) 90 MG TABS tablet Take 1 tablet (90 mg total) by mouth 2 (two) times daily. 60 tablet 11   No current facility-administered medications for this visit.    Allergies:   Review of patient's allergies indicates no known allergies.    Social History:  The patient  reports that he has been smoking Cigarettes.  He has been smoking about 1.00 pack per day. He does not have any smokeless tobacco history on file. He reports that he uses illicit drugs (Marijuana). He reports that he does not drink alcohol.   Family History:  The patient's    family history includes Arrhythmia in his mother; Coronary artery disease in his mother; Coronary artery disease (age of onset: 30) in his father; Heart attack in his brother, father, and mother; Heart disease in his father; Hypertension in his mother. There is no history of Stroke.    ROS:  Please see the history of present illness.   Otherwise, review of systems are positive for none.   All other systems are reviewed and negative.    PHYSICAL EXAM: VS:  BP 130/80 mmHg  Pulse 69  Ht  (1.702 m)  Wt 115.939 kg (255 lb 9.6 oz)  BMI 40.02 kg/m2  SpO2 95% , BMI Body mass index is 40.02 kg/(m^2). GEN:  Obese, in no acute distress Neck: no JVD, HJR, carotid bruits, or masses Cardiac: RRR; no murmurs,gallop, rubs, thrill or heave,  Respiratory:  clear to auscultation bilaterally, normal work of breathing GI: soft, nontender, nondistended, + BS MS: no deformity or atrophy Extremities: right groin at cath sitewithout hematoma or hemorrhage good distal pulses otherwise lower extremities without cyanosis, clubbing, edema, good distal pulses bilaterally.  Skin: warm and dry, no rash Neuro:  Strength and sensation are  intact    EKG:   10/02/15   Normal sinus rhythm with recent anterior MI   Recent Labs: 12/26/2014: TSH 0.534 09/14/2015: B Natriuretic Peptide 40.7 09/15/2015: ALT 36; Hemoglobin 13.0; Platelets 199 09/16/2015: BUN 12; Creatinine, Ser 1.24; Potassium 3.4*; Sodium 140    Lipid Panel    Component Value Date/Time   CHOL 215* 09/14/2015 0823   TRIG 228* 09/14/2015 0823   HDL 30* 09/14/2015 0823   CHOLHDL 7.2 09/14/2015 0823   VLDL 46* 09/14/2015 0823   LDLCALC 139* 09/14/2015 0823      Wt Readings from Last 3 Encounters:  10/27/15 115.939 kg (255 lb 9.6 oz)  10/15/15 117.482 kg (259 lb)  09/22/15 118.389 kg (261 lb)  Other studies Reviewed: Additional studies/ records that were reviewed today include and review of the records demonstrates:   Echo: 09/17/2015 - Left ventricle: The cavity size was mildly dilated. Wall   thickness was normal. Systolic function was moderately reduced.   The estimated ejection fraction was in the range of 35% to 40%.   There is akinesis of the apical myocardium. Features are   consistent with a pseudonormal left ventricular filling pattern,   with concomitant abnormal relaxation and increased filling   pressure (grade 2 diastolic dysfunction). Impressions: - Technically difficult; definity used; apical akinesis with   overall moderately reduced LV function (EF 40); grade 2 diastolic   dysfunction; trace MR and TR.  Cardiac Cath: 01/302017 1. Prox Cx to Mid Cx lesion, 10% stenosed. The lesion was previously treated with a stent (unknown type). 2. Prox LAD lesion, 100% stenosed. 3. Prox RCA lesion, 100% stenosed. 4. Prox LAD to Dist LAD lesion, 100% stenosed. Post intervention, there is a 0% residual stenosis. The lesion was previously treated with a stent (unknown type).   Acute anterior myocardial infarction due to occlusion of the mid LAD within a previously placed bare metal stent. The feel based upon device feedback was that there was  restenosis with ultimate acute occlusion within the stent.  Successful PTCA and restenting of the mid LAD from 100% to 0% with TIMI grade 3 flow, with Angiosculpt scoring balloon and 3.0 x 16 mm Synergy DES. Further mid and distal LAD previously stented in September were widely patent.  Circumflex stent placed in September is widely patent.  Right coronary is chronically occluded and fills by left to right collaterals  Left ventricular systolic dysfunction EF 25-35% RECOMMENDATIONS:    Long-term dual antiplatelet therapy.    Will need social services help .  Heart failure therapy to include beta blocker and ACE inhibitor therapyy  Watch for CHF  Will need 4-5 days in hospital before discharge to workout social issues and adjust medications    ASSESSMENT AND PLAN:  CAD S/P percutaneous coronary angioplasty Patient had a STEMI after stopping his antiplatelet because of financial reasons. He underwent Successful PTCA restenting of the mid LAD with Angio-Seal skull scoring balloon and Synergy DES. Further mid and distal LAD previously stented were patent , circumflex stent was patent , RCA was chronically occluded EF 25-30% at cath. Patient is doing well taking his medications and without symptoms. Gradually increase his activities. I stressed the importance of not stopping any of his medications especially Brilinta.    Essential hypertension Blood pressure stable. Patient is out of lisinopril but he is going to community health to refill it today.  Tobacco abuse Patient quit smoking and is commended for this.  Morbid obesity (HCC) Exercise and weight loss program recommended  Cardiomyopathy, ischemic Patient's EF is 25-35% at cath 35-40% on 2-D echo. No evidence of heart failure and exam. He also has grade 2 diastolic dysfunction. Continue 2 g sodium diet and daily weights Will add lasix 10 mg at night for his PND/orthopnea  Check BMET/BNP in 4 weeks  MRI in 8 weeks to see  if he will need AICD evaluation Will be 3 months post intervention    Charlton Haws

## 2015-10-27 ENCOUNTER — Ambulatory Visit (INDEPENDENT_AMBULATORY_CARE_PROVIDER_SITE_OTHER): Payer: Self-pay | Admitting: Cardiovascular Disease

## 2015-10-27 ENCOUNTER — Encounter: Payer: Self-pay | Admitting: Cardiovascular Disease

## 2015-10-27 VITALS — BP 130/80 | HR 69 | Ht 67.0 in | Wt 255.6 lb

## 2015-10-27 DIAGNOSIS — I10 Essential (primary) hypertension: Secondary | ICD-10-CM

## 2015-10-27 DIAGNOSIS — I25709 Atherosclerosis of coronary artery bypass graft(s), unspecified, with unspecified angina pectoris: Secondary | ICD-10-CM

## 2015-10-27 DIAGNOSIS — I255 Ischemic cardiomyopathy: Secondary | ICD-10-CM

## 2015-10-27 DIAGNOSIS — Z79899 Other long term (current) drug therapy: Secondary | ICD-10-CM

## 2015-10-27 MED ORDER — FUROSEMIDE 20 MG PO TABS: 10.0000 mg | ORAL_TABLET | Freq: Every day | ORAL | Status: DC

## 2015-10-27 MED FILL — FUROSEMIDE 20 MG TABLET: 20 | 30 days supply | Qty: 15 | Fill #0

## 2015-10-27 NOTE — Patient Instructions (Addendum)
Medication Instructions:  Your physician has recommended you make the following change in your medication:  1-START Lasix 10 mg by mouth daily.  Labwork: Your physician recommends that you return for lab work in: 4 weeks for BMET and BNP  Testing/Procedures: Your physician has requested that you have a cardiac MRI in 8 weeks. Cardiac MRI uses a computer to create images of your heart as its beating, producing both still and moving pictures of your heart and major blood vessels. For further information please visit [email or web address]. Please follow the instruction sheet given to you today for more information.  Follow-Up: Your physician recommends that you schedule a follow-up appointment in: 3 months with Dr. Eden EmmsNishan.   If you need a refill on your cardiac medications before your next appointment, please call your pharmacy.

## 2015-10-29 ENCOUNTER — Encounter: Payer: Self-pay | Admitting: Cardiovascular Disease

## 2015-11-06 NOTE — Telephone Encounter (Signed)
This was in Feb that he was discharged [facility] - who saw post hospital.  I am too busy in hospital for now. Thanks.

## 2015-11-10 ENCOUNTER — Ambulatory Visit: Payer: Self-pay | Admitting: *Deleted

## 2015-11-19 MED FILL — LISINOPRIL 5 MG TABLET: 5 | 30 days supply | Qty: 30 | Fill #1

## 2015-11-19 MED FILL — ATORVASTATIN 80 MG TABLET: 80 | 30 days supply | Qty: 30 | Fill #1

## 2015-11-19 MED FILL — ?ZETIA 10 MG TABLET: 10 MG | 30 days supply | Qty: 30 | Fill #1

## 2015-11-19 MED FILL — FENOFIBRATE 48 MG TABLET: 48 | 30 days supply | Qty: 30 | Fill #1

## 2015-11-19 MED FILL — ISOSORBIDE MN ER 30 MG TAB: 30 | 30 days supply | Qty: 30 | Fill #1

## 2015-11-19 MED FILL — BRILINTA 90 MG TABLET: 90 | 30 days supply | Qty: 60 | Fill #1

## 2015-11-24 ENCOUNTER — Other Ambulatory Visit: Payer: Self-pay

## 2015-12-09 ENCOUNTER — Encounter: Payer: Self-pay | Admitting: Cardiovascular Disease

## 2015-12-17 ENCOUNTER — Ambulatory Visit (HOSPITAL_COMMUNITY): Admission: RE | Admit: 2015-12-17 | Payer: MEDICAID | Source: Ambulatory Visit

## 2016-01-27 ENCOUNTER — Ambulatory Visit: Payer: Self-pay | Admitting: Cardiovascular Disease

## 2016-02-01 ENCOUNTER — Other Ambulatory Visit: Payer: Self-pay | Admitting: Internal Medicine

## 2016-02-01 ENCOUNTER — Ambulatory Visit (INDEPENDENT_AMBULATORY_CARE_PROVIDER_SITE_OTHER): Payer: Self-pay | Admitting: Cardiovascular Disease

## 2016-02-01 ENCOUNTER — Encounter: Payer: Self-pay | Admitting: Cardiovascular Disease

## 2016-02-01 VITALS — BP 120/90 | HR 67 | Ht 67.0 in | Wt 264.0 lb

## 2016-02-01 DIAGNOSIS — I25709 Atherosclerosis of coronary artery bypass graft(s), unspecified, with unspecified angina pectoris: Secondary | ICD-10-CM

## 2016-02-01 NOTE — Progress Notes (Signed)
Patient ID: HRISHIKESH HOEG, male   DOB: 1975-12-06, 40 y.o.   MRN: [id number] Cardiology Office Note   Date:  02/01/2016   ID:  JAYMAR LOEBER, DOB 1975-09-01, MRN [id number]  PCP:  Ambrose Finland, NP  Cardiologist:   Dr. Eden Emms  Chief Complaint: post hospital follow-up    History of Present Illness: WILIAN KWONG is a 40 y.o. male who presents for  Post hospital follow-up. Patient was admitted  09/14/15 with a STEMI due to occlusion of the mid LAD within a previously placed BMS. He underwent successful PTCA and restenting of the mid LAD. The distal LAD previously stented was widely patent , circumflex stent widely patent , RCA chronically occluded fills by left to right collaterals, LV systolic dysfunction EF 25-35%. Patient had been noncompliant with antiplatelet therapy because of financial issues. EF was 35-40% by 2-D echo with grade 2 diastolic dysfunction.  Was supposed to have an MRI in April to risk stratify for AICD but did not get it Started on lasix in January for PND at night    Past Medical History  Diagnosis Date  . 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 GERD (gastroesophageal reflux disease)   . Essential hypertension   . Hypercholesteremia   . CAD S/P percutaneous coronary angioplasty     a. s/p BMS-mLAD 2010. b. DES to RCA 2012. c. 04/2015: DES to LCx and PCI to LAD c/b dissection with subsequent overlapping DES to mLAD - r/i for NSTEMI afterwards.  . Tobacco abuse   . GAD (generalized anxiety disorder)   . Depression   . Pre-diabetes   . Morbid obesity (HCC)   . Ischemic cardiomyopathy 09/14/2015    EF 25-35 percent at cath, 35-40 percent by echo  . ST elevation (STEMI) myocardial infarction involving left anterior descending coronary artery (HCC) 09/14/2015    3.0 x 16 mm Synergy DES to the LAD for ISR BMS    Past Surgical History  Procedure Laterality Date  . Coronary stent  placement    . Cardiac catheterization N/A 04/23/2015    Procedure: Left Heart Cath and Coronary Angiography;  Surgeon: Wendall Stade, MD;  Location: [facility] INVASIVE CV LAB;  Service: Cardiovascular;  Laterality: N/A;  . Cardiac catheterization N/A 04/23/2015    Procedure: Coronary Stent Intervention;  Surgeon: Tonny Bollman, MD;  Location: [facility] INVASIVE CV LAB;  Service: Cardiovascular;  Laterality: N/A;  . Cardiac catheterization N/A 09/14/2015    Procedure: Left Heart Cath and Coronary Angiography;  Surgeon: Lyn Records, MD;  LAD 100%, CFX 10% ISR, RCA 100% (chronic), EF 25-35%  . Cardiac catheterization N/A 09/14/2015    Procedure: Coronary Stent Intervention;  Surgeon: Lyn Records, MD;  Location: [facility] INVASIVE CV LAB;  Service: Cardiovascular;  Laterality: N/A; 3.0 x 16 mm Synergy DES LAD     Current Outpatient Prescriptions  Medication Sig Dispense Refill  . ALPRAZolam (XANAX) 0.5 MG tablet Take 1 tablet (0.5 mg total) by mouth 2 (two) times daily as needed for anxiety. 30 tablet 0  . aspirin EC 81 MG tablet Take 81 mg by mouth daily.    Marland Kitchen atorvastatin (LIPITOR) 80 MG tablet Take 1 tablet (80 mg total) by mouth daily at 6 PM. 90 tablet 3  . ezetimibe (ZETIA) 10 MG tablet Take 1 tablet (10 mg total) by mouth daily. 90 tablet 3  . fenofibrate (TRICOR) 48 MG tablet  Take 1 tablet (48 mg total) by mouth daily. 30 tablet 11  . furosemide (LASIX) 20 MG tablet Take 0.5 tablets (10 mg total) by mouth daily. 15 tablet 11  . isosorbide mononitrate (IMDUR) 30 MG 24 hr tablet Take 1 tablet (30 mg total) by mouth daily. 90 tablet 3  . lisinopril (PRINIVIL,ZESTRIL) 5 MG tablet Take 1 tablet (5 mg total) by mouth daily. 90 tablet 3  . metoprolol tartrate (LOPRESSOR) 25 MG tablet Take 1 tablet (25 mg total) by mouth 2 (two) times daily. 60 tablet 11  . nitroGLYCERIN (NITROSTAT) 0.4 MG SL tablet Place 0.4 mg under the tongue every 5 (five) minutes as needed for chest pain (3 x).    . sertraline (ZOLOFT) 50 MG  tablet Take 1 tablet (50 mg total) by mouth daily. 30 tablet 2  . ticagrelor (BRILINTA) 90 MG TABS tablet Take 1 tablet (90 mg total) by mouth 2 (two) times daily. 60 tablet 11   No current facility-administered medications for this visit.    Allergies:   Review of patient's allergies indicates no known allergies.    Social History:  The patient  reports that he has been smoking Cigarettes.  He has been smoking about 1.00 pack per day. He does not have any smokeless tobacco history on file. He reports that he uses illicit drugs (Marijuana). He reports that he does not drink alcohol.   Family History:  The patient's    family history includes Arrhythmia in his mother; Coronary artery disease in his mother; Coronary artery disease (age of onset: 4954) in his father; Heart attack in his brother, father, and mother; Heart disease in his father; Hypertension in his mother. There is no history of Stroke.    ROS:  Please see the history of present illness.   Otherwise, review of systems are positive for none.   All other systems are reviewed and negative.    PHYSICAL EXAM: VS:  BP 120/90 mmHg  Pulse 67  Ht 5\' 7"  (1.702 m)  Wt 264 lb (119.75 kg)  BMI 41.34 kg/m2  SpO2 98% , BMI Body mass index is 41.34 kg/(m^2). GEN:  Obese, in no acute distress Neck: no JVD, HJR, carotid bruits, or masses Cardiac: RRR; no murmurs,gallop, rubs, thrill or heave,  Respiratory:  clear to auscultation bilaterally, normal work of breathing GI: soft, nontender, nondistended, + BS MS: no deformity or atrophy Extremities: right groin at cath sitewithout hematoma or hemorrhage good distal pulses otherwise lower extremities without cyanosis, clubbing, edema, good distal pulses bilaterally.  Skin: warm and dry, no rash Neuro:  Strength and sensation are intact    EKG:   10/02/15   Normal sinus rhythm with recent anterior MI   Recent Labs: 09/14/2015: B Natriuretic Peptide 40.7 09/15/2015: ALT 36; Hemoglobin 13.0;  Platelets 199 09/16/2015: BUN 12; Creatinine, Ser 1.24; Potassium 3.4*; Sodium 140    Lipid Panel    Component Value Date/Time   CHOL 215* 09/14/2015 0823   TRIG 228* 09/14/2015 0823   HDL 30* 09/14/2015 0823   CHOLHDL 7.2 09/14/2015 0823   VLDL 46* 09/14/2015 0823   LDLCALC 139* 09/14/2015 0823      Wt Readings from Last 3 Encounters:  02/01/16 264 lb (119.75 kg)  10/27/15 255 lb 9.6 oz (115.939 kg)  10/15/15 259 lb (117.482 kg)      Other studies Reviewed: Additional studies/ records that were reviewed today include and review of the records demonstrates:   Echo: 09/17/2015 - Left ventricle: The  cavity size was mildly dilated. Wall   thickness was normal. Systolic function was moderately reduced.   The estimated ejection fraction was in the range of 35% to 40%.   There is akinesis of the apical myocardium. Features are   consistent with a pseudonormal left ventricular filling pattern,   with concomitant abnormal relaxation and increased filling   pressure (grade 2 diastolic dysfunction). Impressions: - Technically difficult; definity used; apical akinesis with   overall moderately reduced LV function (EF 40); grade 2 diastolic   dysfunction; trace MR and TR.  Cardiac Cath: 01/302017 1. Prox Cx to Mid Cx lesion, 10% stenosed. The lesion was previously treated with a stent (unknown type). 2. Prox LAD lesion, 100% stenosed. 3. Prox RCA lesion, 100% stenosed. 4. Prox LAD to Dist LAD lesion, 100% stenosed. Post intervention, there is a 0% residual stenosis. The lesion was previously treated with a stent (unknown type).   Acute anterior myocardial infarction due to occlusion of the mid LAD within a previously placed bare metal stent. The feel based upon device feedback was that there was restenosis with ultimate acute occlusion within the stent.  Successful PTCA and restenting of the mid LAD from 100% to 0% with TIMI grade 3 flow, with Angiosculpt scoring balloon and 3.0 x 16  mm Synergy DES. Further mid and distal LAD previously stented in September were widely patent.  Circumflex stent placed in September is widely patent.  Right coronary is chronically occluded and fills by left to right collaterals  Left ventricular systolic dysfunction EF 25-35% RECOMMENDATIONS:    Long-term dual antiplatelet therapy.    Will need social services help .  Heart failure therapy to include beta blocker and ACE inhibitor therapyy  Watch for CHF  Will need 4-5 days in hospital before discharge to workout social issues and adjust medications    ASSESSMENT AND PLAN:  CAD S/P percutaneous coronary angioplasty Patient had a STEMI after stopping his antiplatelet because of financial reasons. He underwent Successful PTCA restenting of the mid LAD with Angio-Seal skull scoring balloon and Synergy DES. Further mid and distal LAD previously stented were patent , circumflex stent was patent , RCA was chronically occluded EF 25-30% at cath. Patient is doing well taking his medications and without symptoms. Gradually increase his activities. I stressed the importance of not stopping any of his medications especially Brilinta.    Essential hypertension Blood pressure stable. Patient is out of lisinopril but he is going to community health to refill it today.  Tobacco abuse Patient quit smoking and is commended for this.  Morbid obesity (HCC) Exercise and weight loss program recommended  Cardiomyopathy, ischemic Patient's EF is 25-35% at cath 35-40% on 2-D echo. No evidence of heart failure and exam. He also has grade 2 diastolic dysfunction. Continue 2 g sodium diet and daily weights Needs to be risk stratified For AICD However he has no insurance right now and not feasible  To order MRI   F/U 3 months     Charlton Haws

## 2016-02-01 NOTE — Patient Instructions (Signed)
Medication Instructions:  Your physician recommends that you continue on your current medications as directed. Please refer to the Current Medication list given to you today.   Labwork: None Ordered   Testing/Procedures: None Ordered   Follow-Up: Your physician recommends that you schedule a follow-up appointment in: 3 months with Dr. Nishan   If you need a refill on your cardiac medications before your next appointment, please call your pharmacy.   Thank you for choosing [facility]! Michelle Swinyer, RN [phone]    

## 2016-03-04 ENCOUNTER — Telehealth: Payer: Self-pay | Admitting: Cardiovascular Disease

## 2016-03-04 NOTE — Telephone Encounter (Signed)
Patient needs a letter from the doctor that states the reason he is not working is due to his heart condition, and that he needs a Visual merchandiserpace maker. Patient is working with Medicare to find coverage for procedure to have a Visual merchandiserpace maker. Patient sounded anxious on the phone, tried to help patient relax by listening to his concerns. Patient stated that legal issues are stressing him out, and he does not want another heart attack. Patient stated he has already given the court case manager his medical records, but now they are requesting a letter from Dr. Eden EmmsNishan. Will forward to Dr. Eden EmmsNishan for advisement.  Patient was thankful for letting him vent his issues. Informed patient that he needs to find someone to talk to about his problems, that this might relieve his stress. Informed patient that I would call him back with Dr. Fabio BeringNishan's response. Patient verbalized understanding.

## 2016-03-04 NOTE — Telephone Encounter (Signed)
Left message for patient to call back  

## 2016-03-04 NOTE — Telephone Encounter (Signed)
Called patient back, and let him know he could pick up letter on Monday afternoon.

## 2016-03-04 NOTE — Telephone Encounter (Signed)
Ok to write note saying he cannot work due to recent MI and ischemic cardiomyopathy may need AICD not pacer

## 2016-03-04 NOTE — Telephone Encounter (Signed)
Peter Russo is calling because he is needing a note for Court to say why he has not been working because of his heart , please call   Thanks

## 2016-03-09 ENCOUNTER — Telehealth: Payer: Self-pay | Admitting: Cardiovascular Disease

## 2016-03-09 NOTE — Telephone Encounter (Signed)
Walk in pt form-please call patient about letter he was given-Pam I back on Thursday will give to her then.

## 2016-03-15 ENCOUNTER — Telehealth: Payer: Self-pay | Admitting: Cardiovascular Disease

## 2016-03-15 NOTE — Telephone Encounter (Signed)
Patient needing a note with specific dates for being out of work. Informed patient that Pam, [phone] nurse, will call him to discuss dates and anything else needed for note before she creates an updated one for the patient. Patient is agreeable and thanks me for calling and informing him they are out of the office today.

## 2016-03-15 NOTE — Telephone Encounter (Signed)
Pt has a question about the note given to him from Dr Eden Emms, concerning his child support.

## 2016-03-16 ENCOUNTER — Other Ambulatory Visit: Payer: Self-pay

## 2016-03-16 ENCOUNTER — Emergency Department (HOSPITAL_COMMUNITY): Payer: Medicare Other

## 2016-03-16 ENCOUNTER — Encounter (HOSPITAL_COMMUNITY): Payer: Self-pay | Admitting: Emergency Medicine

## 2016-03-16 ENCOUNTER — Inpatient Hospital Stay (HOSPITAL_COMMUNITY)
Admission: EM | Admit: 2016-03-16 | Discharge: 2016-03-17 | DRG: 250 | Disposition: A | Discharge status: Home / Self Care | Payer: Medicare Other | Attending: Cardiovascular Disease | Admitting: Cardiovascular Disease

## 2016-03-16 ENCOUNTER — Encounter (HOSPITAL_COMMUNITY)
Admission: EM | Disposition: A | Discharge status: Home / Self Care | Payer: Self-pay | Source: Home / Self Care | Attending: Cardiovascular Disease

## 2016-03-16 DIAGNOSIS — F17200 Nicotine dependence, unspecified, uncomplicated: Secondary | ICD-10-CM | POA: Diagnosis not present

## 2016-03-16 DIAGNOSIS — I110 Hypertensive heart disease with heart failure: Secondary | ICD-10-CM | POA: Diagnosis not present

## 2016-03-16 DIAGNOSIS — I472 Ventricular tachycardia: Secondary | ICD-10-CM | POA: Diagnosis not present

## 2016-03-16 DIAGNOSIS — R7303 Prediabetes: Secondary | ICD-10-CM | POA: Diagnosis present

## 2016-03-16 DIAGNOSIS — Z79899 Other long term (current) drug therapy: Secondary | ICD-10-CM | POA: Diagnosis not present

## 2016-03-16 DIAGNOSIS — I214 Non-ST elevation (NSTEMI) myocardial infarction: Secondary | ICD-10-CM | POA: Diagnosis not present

## 2016-03-16 DIAGNOSIS — R0789 Other chest pain: Secondary | ICD-10-CM | POA: Diagnosis not present

## 2016-03-16 DIAGNOSIS — I2510 Atherosclerotic heart disease of native coronary artery without angina pectoris: Secondary | ICD-10-CM | POA: Diagnosis not present

## 2016-03-16 DIAGNOSIS — E782 Mixed hyperlipidemia: Secondary | ICD-10-CM | POA: Diagnosis not present

## 2016-03-16 DIAGNOSIS — I10 Essential (primary) hypertension: Secondary | ICD-10-CM

## 2016-03-16 DIAGNOSIS — Z6841 Body Mass Index (BMI) 40.0 and over, adult: Secondary | ICD-10-CM | POA: Diagnosis not present

## 2016-03-16 DIAGNOSIS — R079 Chest pain, unspecified: Secondary | ICD-10-CM

## 2016-03-16 DIAGNOSIS — F329 Major depressive disorder, single episode, unspecified: Secondary | ICD-10-CM | POA: Diagnosis not present

## 2016-03-16 DIAGNOSIS — I255 Ischemic cardiomyopathy: Secondary | ICD-10-CM

## 2016-03-16 DIAGNOSIS — E785 Hyperlipidemia, unspecified: Secondary | ICD-10-CM | POA: Diagnosis present

## 2016-03-16 DIAGNOSIS — Z955 Presence of coronary angioplasty implant and graft: Secondary | ICD-10-CM | POA: Diagnosis not present

## 2016-03-16 DIAGNOSIS — Z9114 Patient's other noncompliance with medication regimen: Secondary | ICD-10-CM | POA: Diagnosis not present

## 2016-03-16 DIAGNOSIS — Z8249 Family history of ischemic heart disease and other diseases of the circulatory system: Secondary | ICD-10-CM | POA: Diagnosis not present

## 2016-03-16 DIAGNOSIS — Z9861 Coronary angioplasty status: Secondary | ICD-10-CM

## 2016-03-16 DIAGNOSIS — Z7982 Long term (current) use of aspirin: Secondary | ICD-10-CM | POA: Diagnosis not present

## 2016-03-16 DIAGNOSIS — Z7902 Long term (current) use of antithrombotics/antiplatelets: Secondary | ICD-10-CM | POA: Diagnosis not present

## 2016-03-16 DIAGNOSIS — I5023 Acute on chronic systolic (congestive) heart failure: Secondary | ICD-10-CM | POA: Diagnosis not present

## 2016-03-16 DIAGNOSIS — K219 Gastro-esophageal reflux disease without esophagitis: Secondary | ICD-10-CM | POA: Diagnosis present

## 2016-03-16 DIAGNOSIS — I25111 Atherosclerotic heart disease of native coronary artery with angina pectoris with documented spasm: Secondary | ICD-10-CM

## 2016-03-16 DIAGNOSIS — I252 Old myocardial infarction: Secondary | ICD-10-CM

## 2016-03-16 HISTORY — PX: CARDIAC CATHETERIZATION: SHX172

## 2016-03-16 LAB — CBC
HCT: 41.8 % (ref 39.0–52.0)
HCT: 41.9 % (ref 39.0–52.0)
Hemoglobin: 14.4 g/dL (ref 13.0–17.0)
Hemoglobin: 14.2 g/dL (ref 13.0–17.0)
MCH: 29.4 pg (ref 26.0–34.0)
MCH: 29.2 pg (ref 26.0–34.0)
MCHC: 34.4 g/dL (ref 30.0–36.0)
MCHC: 33.9 g/dL (ref 30.0–36.0)
MCV: 85.5 fL (ref 78.0–100.0)
MCV: 86 fL (ref 78.0–100.0)
Platelets: 238 10*3/uL (ref 150–400)
Platelets: 213 10*3/uL (ref 150–400)
RBC: 4.89 MIL/uL (ref 4.22–5.81)
RBC: 4.87 MIL/uL (ref 4.22–5.81)
RDW: 13.5 % (ref 11.5–15.5)
RDW: 13.3 % (ref 11.5–15.5)
WBC: 9.9 10*3/uL (ref 4.0–10.5)
WBC: 10.1 10*3/uL (ref 4.0–10.5)

## 2016-03-16 LAB — I-STAT TROPONIN, ED
Troponin i, poc: 0.06 ng/mL (ref 0.00–0.08)
Troponin i, poc: 0.08 ng/mL (ref 0.00–0.08)

## 2016-03-16 LAB — TROPONIN I
Troponin I: 0.34 ng/mL (ref <0.03)
Troponin I: 2.41 ng/mL (ref <0.03)
Troponin I: 10.76 ng/mL (ref <0.03)

## 2016-03-16 LAB — BASIC METABOLIC PANEL
Anion gap: 9 (ref 5–15)
BUN: 11 mg/dL (ref 6–20)
CO2: 23 mmol/L (ref 22–32)
Calcium: 9.3 mg/dL (ref 8.9–10.3)
Chloride: 105 mmol/L (ref 101–111)
Creatinine, Ser: 1.14 mg/dL (ref 0.61–1.24)
GFR calc Af Amer: >60 mL/min (ref >60)
GFR calc non Af Amer: >60 mL/min (ref >60)
Glucose, Bld: 111 mg/dL — ABNORMAL HIGH (ref 65–99)
Potassium: 3.9 mmol/L (ref 3.5–5.1)
Sodium: 137 mmol/L (ref 135–145)

## 2016-03-16 LAB — POCT ACTIVATED CLOTTING TIME
Activated Clotting Time: 164 seconds
Activated Clotting Time: 472 seconds

## 2016-03-16 LAB — CREATININE, SERUM
Creatinine, Ser: 1.14 mg/dL (ref 0.61–1.24)
GFR calc Af Amer: >60 mL/min (ref >60)
GFR calc non Af Amer: >60 mL/min (ref >60)

## 2016-03-16 LAB — HEPARIN LEVEL (UNFRACTIONATED): Heparin Unfractionated: <0.1 IU/mL — ABNORMAL LOW (ref 0.30–0.70)

## 2016-03-16 SURGERY — LEFT HEART CATH AND CORONARY ANGIOGRAPHY
Anesthesia: LOCAL

## 2016-03-16 MED ORDER — FENTANYL CITRATE (PF) 100 MCG/2ML IJ SOLN
INTRAMUSCULAR | Status: AC
  Filled 2016-03-16: qty 2

## 2016-03-16 MED ORDER — HEPARIN (PORCINE) IN NACL 2-0.9 UNIT/ML-% IJ SOLN
INTRAMUSCULAR | Status: AC
  Filled 2016-03-16: qty 1000

## 2016-03-16 MED ORDER — LIDOCAINE HCL (PF) 1 % IJ SOLN
INTRAMUSCULAR | Status: AC
  Filled 2016-03-16: qty 30

## 2016-03-16 MED ORDER — TICAGRELOR 90 MG PO TABS: 90.0000 mg | ORAL_TABLET | Freq: Two times a day (BID) | ORAL | Status: DC

## 2016-03-16 MED ORDER — BIVALIRUDIN 250 MG IV SOLR
INTRAVENOUS | Status: AC
  Filled 2016-03-16: qty 250

## 2016-03-16 MED ORDER — NITROGLYCERIN 0.4 MG SL SUBL: 0.4000 mg | SUBLINGUAL_TABLET | SUBLINGUAL | Status: DC | PRN

## 2016-03-16 MED ORDER — LIDOCAINE HCL (PF) 1 % IJ SOLN
INTRAMUSCULAR | Status: DC | PRN
  Administered 2016-03-16: 1 mL via INTRADERMAL

## 2016-03-16 MED ORDER — ACETAMINOPHEN 325 MG PO TABS: 650.0000 mg | ORAL_TABLET | ORAL | Status: DC | PRN

## 2016-03-16 MED ORDER — MORPHINE SULFATE (PF) 4 MG/ML IV SOLN
4.0000 mg | Freq: Once | INTRAVENOUS | Status: AC
Start: 2016-03-16 — End: 2016-03-16
  Administered 2016-03-16: 4 mg via INTRAVENOUS
  Filled 2016-03-16: qty 1

## 2016-03-16 MED ORDER — SODIUM CHLORIDE 0.9 % IV SOLN: 250.0000 mL | INTRAVENOUS | Status: DC | PRN

## 2016-03-16 MED ORDER — SODIUM CHLORIDE 0.9 % IV SOLN: INTRAVENOUS | Status: DC

## 2016-03-16 MED ORDER — SODIUM CHLORIDE 0.9% FLUSH
3.0000 mL | Freq: Two times a day (BID) | INTRAVENOUS | Status: DC
  Administered 2016-03-17: 10:00:00 3 mL via INTRAVENOUS

## 2016-03-16 MED ORDER — SODIUM CHLORIDE 0.9% FLUSH: 3.0000 mL | Freq: Two times a day (BID) | INTRAVENOUS | Status: DC

## 2016-03-16 MED ORDER — ALPRAZOLAM 0.25 MG PO TABS
0.2500 mg | ORAL_TABLET | Freq: Two times a day (BID) | ORAL | Status: DC | PRN
  Filled 2016-03-16: qty 1

## 2016-03-16 MED ORDER — MIDAZOLAM HCL 2 MG/2ML IJ SOLN
INTRAMUSCULAR | Status: DC | PRN
  Administered 2016-03-16: 1 mg via INTRAVENOUS

## 2016-03-16 MED ORDER — FENTANYL CITRATE (PF) 100 MCG/2ML IJ SOLN
INTRAMUSCULAR | Status: DC | PRN
  Administered 2016-03-16: 50 ug via INTRAVENOUS

## 2016-03-16 MED ORDER — ONDANSETRON HCL 4 MG/2ML IJ SOLN: 4.0000 mg | Freq: Four times a day (QID) | INTRAMUSCULAR | Status: DC | PRN

## 2016-03-16 MED ORDER — HEPARIN (PORCINE) IN NACL 2-0.9 UNIT/ML-% IJ SOLN
INTRAMUSCULAR | Status: DC | PRN
  Administered 2016-03-16: 1500 mL

## 2016-03-16 MED ORDER — HEPARIN BOLUS VIA INFUSION
4000.0000 [IU] | Freq: Once | INTRAVENOUS | Status: AC
  Administered 2016-03-16: 4000 [IU] via INTRAVENOUS
  Filled 2016-03-16: qty 4000

## 2016-03-16 MED ORDER — IOPAMIDOL (ISOVUE-370) INJECTION 76%
INTRAVENOUS | Status: AC
  Filled 2016-03-16: qty 50

## 2016-03-16 MED ORDER — ASPIRIN 81 MG PO CHEW: 81.0000 mg | CHEWABLE_TABLET | Freq: Every day | ORAL | Status: DC

## 2016-03-16 MED ORDER — NITROGLYCERIN 1 MG/10 ML FOR IR/CATH LAB
INTRA_ARTERIAL | Status: AC
  Filled 2016-03-16: qty 10

## 2016-03-16 MED ORDER — VERAPAMIL HCL 2.5 MG/ML IV SOLN
INTRAVENOUS | Status: AC
  Filled 2016-03-16: qty 2

## 2016-03-16 MED ORDER — HEPARIN (PORCINE) IN NACL 100-0.45 UNIT/ML-% IJ SOLN
1300.0000 [IU]/h | INTRAMUSCULAR | Status: DC
  Administered 2016-03-16: 1300 [IU]/h via INTRAVENOUS
  Filled 2016-03-16 (×2): qty 250

## 2016-03-16 MED ORDER — HEART ATTACK BOUNCING BOOK
Freq: Once | Status: AC
  Administered 2016-03-16: 17:00:00
  Filled 2016-03-16: qty 1

## 2016-03-16 MED ORDER — FENOFIBRATE 54 MG PO TABS
54.0000 mg | ORAL_TABLET | Freq: Every day | ORAL | Status: DC
  Administered 2016-03-16 – 2016-03-17 (×2): 54 mg via ORAL
  Filled 2016-03-16 (×2): qty 1

## 2016-03-16 MED ORDER — LIVING BETTER WITH HEART FAILURE BOOK
Freq: Once | Status: AC
  Administered 2016-03-16: 17:00:00

## 2016-03-16 MED ORDER — MIDAZOLAM HCL 2 MG/2ML IJ SOLN
INTRAMUSCULAR | Status: AC
  Filled 2016-03-16: qty 2

## 2016-03-16 MED ORDER — ENOXAPARIN SODIUM 40 MG/0.4ML ~~LOC~~ SOLN
40.0000 mg | SUBCUTANEOUS | Status: DC
  Administered 2016-03-17: 40 mg via SUBCUTANEOUS
  Filled 2016-03-16: qty 0.4

## 2016-03-16 MED ORDER — IOPAMIDOL (ISOVUE-370) INJECTION 76%
INTRAVENOUS | Status: AC
  Filled 2016-03-16: qty 100

## 2016-03-16 MED ORDER — ASPIRIN 81 MG PO CHEW: 324.0000 mg | CHEWABLE_TABLET | Freq: Once | ORAL | Status: DC

## 2016-03-16 MED ORDER — SODIUM CHLORIDE 0.9 % IV SOLN
INTRAVENOUS | Status: DC | PRN
  Administered 2016-03-16: 1.75 mg/kg/h via INTRAVENOUS
  Administered 2016-03-16: 14:00:00

## 2016-03-16 MED ORDER — NITROGLYCERIN IN D5W 200-5 MCG/ML-% IV SOLN
INTRAVENOUS | Status: AC
  Filled 2016-03-16: qty 250

## 2016-03-16 MED ORDER — ANGIOPLASTY BOOK
Freq: Once | Status: AC
  Administered 2016-03-16: 17:00:00
  Filled 2016-03-16: qty 1

## 2016-03-16 MED ORDER — BIVALIRUDIN BOLUS VIA INFUSION - CUPID
INTRAVENOUS | Status: DC | PRN
  Administered 2016-03-16: 89.775 mg via INTRAVENOUS

## 2016-03-16 MED ORDER — SODIUM CHLORIDE 0.9% FLUSH: 3.0000 mL | INTRAVENOUS | Status: DC | PRN

## 2016-03-16 MED ORDER — NITROGLYCERIN 0.4 MG SL SUBL
0.4000 mg | SUBLINGUAL_TABLET | SUBLINGUAL | Status: DC | PRN
  Administered 2016-03-16 (×2): 0.4 mg via SUBLINGUAL
  Filled 2016-03-16 (×2): qty 1

## 2016-03-16 MED ORDER — TICAGRELOR 90 MG PO TABS
90.0000 mg | ORAL_TABLET | Freq: Two times a day (BID) | ORAL | Status: DC
  Administered 2016-03-16 – 2016-03-17 (×2): 90 mg via ORAL
  Filled 2016-03-16: qty 1

## 2016-03-16 MED ORDER — NITROGLYCERIN IN D5W 200-5 MCG/ML-% IV SOLN: 20.0000 ug/min | INTRAVENOUS | Status: AC

## 2016-03-16 MED ORDER — LISINOPRIL 5 MG PO TABS
5.0000 mg | ORAL_TABLET | Freq: Every day | ORAL | Status: DC
  Administered 2016-03-17: 5 mg via ORAL
  Filled 2016-03-16: qty 1

## 2016-03-16 MED ORDER — NITROGLYCERIN 1 MG/10 ML FOR IR/CATH LAB
INTRA_ARTERIAL | Status: DC | PRN
  Administered 2016-03-16: 300 ug via INTRACORONARY

## 2016-03-16 MED ORDER — SODIUM CHLORIDE 0.9 % IV SOLN
Freq: Once | INTRAVENOUS | Status: AC
  Administered 2016-03-16: 12:00:00 via INTRAVENOUS

## 2016-03-16 MED ORDER — SODIUM CHLORIDE 0.9 % IV SOLN: INTRAVENOUS | Status: AC

## 2016-03-16 MED ORDER — ASPIRIN EC 81 MG PO TBEC
81.0000 mg | DELAYED_RELEASE_TABLET | Freq: Every day | ORAL | Status: DC
  Administered 2016-03-17: 10:00:00 81 mg via ORAL
  Filled 2016-03-16: qty 1

## 2016-03-16 MED ORDER — FUROSEMIDE 10 MG/ML IJ SOLN
40.0000 mg | Freq: Once | INTRAMUSCULAR | Status: AC
  Administered 2016-03-16: 40 mg via INTRAVENOUS
  Filled 2016-03-16: qty 4

## 2016-03-16 MED ORDER — NITROGLYCERIN IN D5W 200-5 MCG/ML-% IV SOLN
INTRAVENOUS | Status: DC | PRN
  Administered 2016-03-16: 20 ug/min via INTRAVENOUS

## 2016-03-16 MED ORDER — GI COCKTAIL ~~LOC~~
30.0000 mL | Freq: Once | ORAL | Status: AC
  Administered 2016-03-16: 30 mL via ORAL
  Filled 2016-03-16: qty 30

## 2016-03-16 MED ORDER — ATORVASTATIN CALCIUM 80 MG PO TABS
80.0000 mg | ORAL_TABLET | Freq: Every day | ORAL | Status: DC
  Administered 2016-03-16: 80 mg via ORAL
  Filled 2016-03-16: qty 1

## 2016-03-16 MED ORDER — IOPAMIDOL (ISOVUE-370) INJECTION 76%
INTRAVENOUS | Status: DC | PRN
  Administered 2016-03-16: 190 mL via INTRAVENOUS

## 2016-03-16 MED ORDER — ONDANSETRON HCL 4 MG/2ML IJ SOLN
4.0000 mg | Freq: Once | INTRAMUSCULAR | Status: AC
  Administered 2016-03-16: 4 mg via INTRAVENOUS
  Filled 2016-03-16: qty 2

## 2016-03-16 SURGICAL SUPPLY — 18 items
BALLN EMERGE MR 2.0X12 (BALLOONS) ×2
BALLN EMERGE MR 2.25X12 (BALLOONS) ×2
BALLN ~~LOC~~ EMERGE MR 2.5X12 (BALLOONS) ×2
BALLOON EMERGE MR 2.0X12 (BALLOONS) IMPLANT
BALLOON EMERGE MR 2.25X12 (BALLOONS) IMPLANT
BALLOON ~~LOC~~ EMERGE MR 2.5X12 (BALLOONS) IMPLANT
CATH INFINITI 5 FR JL3.5 (CATHETERS) ×1 IMPLANT
CATH INFINITI JR4 5F (CATHETERS) ×1 IMPLANT
CATH VISTA GUIDE 6FR XBLAD3.0 (CATHETERS) ×1 IMPLANT
DEVICE RAD COMP TR BAND LRG (VASCULAR PRODUCTS) ×1 IMPLANT
GLIDESHEATH SLEND SS 6F .021 (SHEATH) ×1 IMPLANT
KIT ENCORE 26 ADVANTAGE (KITS) ×1 IMPLANT
KIT HEART LEFT (KITS) ×2 IMPLANT
PACK CARDIAC CATHETERIZATION (CUSTOM PROCEDURE TRAY) ×2 IMPLANT
TRANSDUCER W/STOPCOCK (MISCELLANEOUS) ×2 IMPLANT
TUBING CIL FLEX 10 FLL-RA (TUBING) ×2 IMPLANT
WIRE ASAHI PROWATER 180CM (WIRE) ×1 IMPLANT
WIRE SAFE-T 1.5MM-J .035X260CM (WIRE) ×1 IMPLANT

## 2016-03-16 NOTE — Progress Notes (Signed)
CRITICAL VALUE ALERT  Critical value received:  Troponin 2.41  Date of notification:  03/16/16  Time of notification:  1700  Critical value read back:Yes.    Nurse who received alert:  Elige Radon RN  MD notified (1st page):  Su Hilt NP  Time of first page:   1706 No call back  MD notified (2nd page): Dr. Katrinka Blazing  Time of second page: 1720  Responding MD:  Dr Katrinka Blazing states, "This is expected"  Time MD responded: 1721

## 2016-03-16 NOTE — Progress Notes (Signed)
ANTICOAGULATION CONSULT NOTE - Initial Consult  Pharmacy Consult for heparin Indication: chest pain/ACS  No Known Allergies  Patient Measurements:   Heparin Dosing Weight: 94kg  Vital Signs: BP: 149/74 (08/02 1115) Pulse Rate: 51 (08/02 1115)  Labs:  Recent Labs  03/16/16 0628 03/16/16 0903  HGB 14.4  --   HCT 41.8  --   PLT 238  --   CREATININE 1.14  --   TROPONINI  --  0.34*    CrCl cannot be calculated (Unknown ideal weight.).   Assessment: 4 YOM presenting with chest pain. He has history of CAD, ischemic cardiomyopathy (EF 35-40%) and STEMI in 08/2015 with placement of BMS.   POC troponin mildly elevated- if these continue to rise, may need to go for cath. Baseline Hgb 14.4, plts 238- no bleeding noted. He is not on anticoagulation prior to admission.  Goal of Therapy:  Heparin level 0.3-0.7 units/ml Monitor platelets by anticoagulation protocol: Yes   Plan:  -heparin bolus with 4000 units IV x1, then start infusion at 1300 units/hr -heparin level in 6h -daily HL and CBC -follow for cardiology cath plans  Karlina Suares D. Collis Thede, PharmD, BCPS Clinical Pharmacist Pager: [phone] 03/16/2016 11:53 AM

## 2016-03-16 NOTE — ED Notes (Signed)
Pt arrives to [facility] at this time via EMS stretcher.  Pt reports that he began having generalized chest pain rated 10/10 at 0400 this morning.  Pt reports a history of MI's with stents.  Pt reports that he took 324 mg ASA and 2 sublingual NTG tablets prior to EMS arrival.  EMS reports that they administered 4 sublingual NTG tablets prior to arriving to the Ed.   Chief Complaint  Patient presents with  . Chest Pain    Pt reports that he began having chest pain at 0400 this morning.     Past Medical History:  Diagnosis Date  . CAD S/P percutaneous coronary angioplasty    a. s/p BMS-mLAD 2010. b. DES to RCA 2012. c. 04/2015: DES to LCx and PCI to LAD c/b dissection with subsequent overlapping DES to mLAD - r/i for NSTEMI afterwards.  . Depression   . Essential hypertension   . GAD (generalized anxiety disorder)   . GERD (gastroesophageal reflux disease)   . Hypercholesteremia   . Ischemic cardiomyopathy 09/14/2015   EF 25-35 percent at cath, 35-40 percent by echo  . Morbid obesity (HCC)   . Pre-diabetes   . ST elevation (STEMI) myocardial infarction involving left anterior descending coronary artery (HCC) 09/14/2015   3.0 x 16 mm Synergy DES to the LAD for ISR BMS  . SVT (supraventricular tachycardia) (HCC) APRROX 10 YRS AGO   PRESUMED AVNRT, ECHO 12/07 WITH EF 65%, MILD LAE  . Tobacco abuse

## 2016-03-16 NOTE — Consult Note (Addendum)
Patient ID: Peter Russo MRN: [id number], DOB/AGE: 02-06-1976   Admit date: 03/16/2016   Reason for Consult: Chest Pain Requesting MD: Dr. Judd Lien, [facility] ED   Primary Physician: Ambrose Finland, NP Primary Cardiologist: Dr. Eden Emms   Pt. Profile:  40 y/o male with h/o CAD, ischemic cardiomyopathy (EF 35-40%), HTN, HLD and pre-diabetes, obesity and GERD presenting to the ED with CP.    Problem List  Past Medical History:  Diagnosis Date  . CAD S/P percutaneous coronary angioplasty    a. s/p BMS-mLAD 2010. b. DES to RCA 2012. c. 04/2015: DES to LCx and PCI to LAD c/b dissection with subsequent overlapping DES to mLAD - r/i for NSTEMI afterwards.  . Depression   . Essential hypertension   . GAD (generalized anxiety disorder)   . GERD (gastroesophageal reflux disease)   . Hypercholesteremia   . Ischemic cardiomyopathy 09/14/2015   EF 25-35 percent at cath, 35-40 percent by echo  . Morbid obesity (HCC)   . Pre-diabetes   . ST elevation (STEMI) myocardial infarction involving left anterior descending coronary artery (HCC) 09/14/2015   3.0 x 16 mm Synergy DES to the LAD for ISR BMS  . SVT (supraventricular tachycardia) (HCC) APRROX 10 YRS AGO   PRESUMED AVNRT, ECHO 12/07 WITH EF 65%, MILD LAE  . Tobacco abuse     Past Surgical History:  Procedure Laterality Date  . CARDIAC CATHETERIZATION N/A 04/23/2015   Procedure: Left Heart Cath and Coronary Angiography;  Surgeon: Wendall Stade, MD;  Location: [facility] INVASIVE CV LAB;  Service: Cardiovascular;  Laterality: N/A;  . CARDIAC CATHETERIZATION N/A 04/23/2015   Procedure: Coronary Stent Intervention;  Surgeon: Tonny Bollman, MD;  Location: [facility] INVASIVE CV LAB;  Service: Cardiovascular;  Laterality: N/A;  . CARDIAC CATHETERIZATION N/A 09/14/2015   Procedure: Left Heart Cath and Coronary Angiography;  Surgeon: Lyn Records, MD;  LAD 100%, CFX 10% ISR, RCA 100% (chronic), EF 25-35%  . CARDIAC CATHETERIZATION N/A 09/14/2015   Procedure: Coronary  Stent Intervention;  Surgeon: Lyn Records, MD;  Location: [facility] INVASIVE CV LAB;  Service: Cardiovascular;  Laterality: N/A; 3.0 x 16 mm Synergy DES LAD  . CORONARY STENT PLACEMENT       Allergies  No Known Allergies  HPI  40 y/o male with h/o CAD, ischemic cardiomyopathy (EF 35-40%), HTN, HLD,  pre-diabetes, obesity and GERD presenting to the ED with CP.   Patient was last admitted 09/14/15 with a STEMI due to occlusion of the mid LAD within a previously placed BMS. He underwent successful PTCA and restenting of the mid LAD. The distal LAD, which was previously stented, was widely patent.  The circumflex stent widely patent. The  RCA chronically occluded, but fills by left to right collaterals.  LV systolic dysfunction was noted on cath. EF 25-35%. EF was 35-40% by 2-D echo with grade 2 diastolic dysfunction. Per notes, he was supposed to have an MRI in April to risk stratify for AICD but did not get it, due to lack of insurance. He has also had issues with medication compliance to due no insurance and difficulty affording medications. He was last seen in clinic by Dr. Eden Emms 02/01/16. He was stable w/o CP. Continued medical therapy was recommended. He was instructed to f/u in 3 months.   He presented to the [facility] ED this morning with complaint of CP. Symptoms started roughly at 4:30 AM and awoke him from his sleep. Described as substernal burning sensation radiating up into his  throat and left chest. No associated dyspnea, diaphoresis, n/v, syncope/ near syncope. This is different from his CP associated with prior MI in January, which was characterized as  Pressure/heaviness. Current CP is worse lying down. Relieved sitting up. Nonexertional, in fact he walked up and down the stairs in his home this morning w/o exacerbation of symptoms. He admits that he ate a spicy meal around 7:30 PM last night. He ate cabbage seasoned with red pepper flakes. His girlfriend noted that it was really spicy. He had no  improvement in pain with SL NTG x 3. No improvement with ASA. Mild improvement with morphine but still with discomfort. He has been compliant with ASA + Brilinta.    EKG shows NSR. Borderline ST elevations in lateral leads, but no significant change from previous EKG 09/2015. POC troponin is mildly elevated.   Regular troponin levels have not been drawn.   BMP and CBC pending. CXR unremarkable. BP moderately elevated in the 150s.   Started smoking again  !   Home Medications  Prior to Admission medications   Medication Sig Start Date End Date Taking? Authorizing Provider  atorvastatin (LIPITOR) 80 MG tablet Take 1 tablet (80 mg total) by mouth daily at 6 PM. 09/11/15  Yes Leone Brand, NP  fenofibrate (TRICOR) 48 MG tablet Take 1 tablet (48 mg total) by mouth daily. 07/08/15  Yes Dayna N Dunn, PA-C  lisinopril (PRINIVIL,ZESTRIL) 5 MG tablet Take 1 tablet (5 mg total) by mouth daily. 09/22/15  Yes Dyann Kief, PA-C  nitroGLYCERIN (NITROSTAT) 0.4 MG SL tablet Place 0.4 mg under the tongue every 5 (five) minutes as needed for chest pain (3 x).   Yes Historical Provider, MD  ticagrelor (BRILINTA) 90 MG TABS tablet Take 1 tablet (90 mg total) by mouth 2 (two) times daily. 09/18/15  Yes Rhonda G Barrett, PA-C  ALPRAZolam (XANAX) 0.5 MG tablet Take 1 tablet (0.5 mg total) by mouth 2 (two) times daily as needed for anxiety. Patient not taking: Reported on 03/16/2016 12/26/14   Ambrose Finland, NP  ezetimibe (ZETIA) 10 MG tablet Take 1 tablet (10 mg total) by mouth daily. Patient not taking: Reported on 03/16/2016 09/11/15   Leone Brand, NP  furosemide (LASIX) 20 MG tablet Take 0.5 tablets (10 mg total) by mouth daily. Patient not taking: Reported on 03/16/2016 10/27/15   Wendall Stade, MD  isosorbide mononitrate (IMDUR) 30 MG 24 hr tablet Take 1 tablet (30 mg total) by mouth daily. Patient not taking: Reported on 03/16/2016 09/11/15   Leone Brand, NP  metoprolol tartrate (LOPRESSOR) 25 MG tablet Take 1  tablet (25 mg total) by mouth 2 (two) times daily. Patient not taking: Reported on 03/16/2016 04/25/15   Janetta Hora, PA-C  sertraline (ZOLOFT) 50 MG tablet TAKE 1 TABLET BY MOUTH DAILY Patient not taking: Reported on 03/16/2016 02/01/16   Quentin Angst, MD    Family History  Family History  Problem Relation Age of Onset  . Arrhythmia Mother     MOTHER LIVED TO BE 102  . Coronary artery disease Mother   . Heart attack Mother   . Hypertension Mother   . Coronary artery disease Father 69  . Heart disease Father   . Heart attack Father   . Heart attack Brother   . Stroke Neg Hx     Social History  Social History   Social History  . Marital status: Single    Spouse name: N/A  .  Number of children: N/A  . 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 Smoking status: Current Every Day Smoker    Packs/day: 1.00    Types: Cigarettes  . Smokeless tobacco: Not on file  . Alcohol use No  . Drug use:     Types: Marijuana  . Sexual activity: Not on file   Other Topics Concern  . Not on file   Social History Narrative   REGULAR EXERCISE     Review of Systems General:  No chills, fever, night sweats or weight changes.  Cardiovascular:  No chest pain, dyspnea on exertion, edema, orthopnea, palpitations, paroxysmal nocturnal dyspnea. Dermatological: No rash, lesions/masses Respiratory: No cough, dyspnea Urologic: No hematuria, dysuria Abdominal:   No nausea, vomiting, diarrhea, bright red blood per rectum, melena, or hematemesis Neurologic:  No visual changes, wkns, changes in mental status. All other systems reviewed and are otherwise negative except as noted above.  Physical Exam  Blood pressure 153/86, pulse (!) 59, resp. rate 10, SpO2 97 %.  General: Pleasant, NAD, moderately obese Psych: Normal affect. Neuro: Alert and oriented X 3. Moves all extremities spontaneously. HEENT: Normal  Neck:  Supple without bruits or JVD. Lungs:  Resp regular and unlabored, CTA. Heart: RRR no s3, s4, or murmurs. Abdomen: Soft, non-tender, non-distended, BS + x 4.  Extremities: No clubbing, cyanosis or edema. DP/PT/Radials 2+ and equal bilaterally.  Labs  Troponin [facility] of Care Test)  Recent Labs  03/16/16 0635  TROPIPOC 0.06   No results for input(s): CKTOTAL, CKMB, TROPONINI in the last 72 hours. Lab Results  Component Value Date   WBC 13.1 (H) 09/15/2015   HGB 13.0 09/15/2015   HCT 37.0 (L) 09/15/2015   MCV 84.3 09/15/2015   PLT 199 09/15/2015   No results for input(s): NA, K, CL, CO2, BUN, CREATININE, CALCIUM, PROT, BILITOT, ALKPHOS, ALT, AST, GLUCOSE in the last 168 hours.  Invalid input(s): LABALBU Lab Results  Component Value Date   CHOL 215 (H) 09/14/2015   HDL 30 (L) 09/14/2015   LDLCALC 139 (H) 09/14/2015   TRIG 228 (H) 09/14/2015   No results found for: DDIMER   Radiology/Studies  Dg Chest Port 1 View  Result Date: 03/16/2016 CLINICAL DATA:  40 year old male with chest pain EXAM: PORTABLE CHEST 1 VIEW COMPARISON:  Chest radiograph dated 12/19/2014 FINDINGS: Single portable view of chest does not demonstrate a focal consolidation. There is no pleural effusion or pneumothorax. Stable top-normal cardiac silhouette. No acute osseous pathology. IMPRESSION: No active disease. Electronically Signed   By: Elgie Collard M.D.   On: 03/16/2016 06:43    ECG  NSR. Borderline ST elevations in lateral leads, but no significant change from previous EKG 09/2015.    ASSESSMENT AND PLAN  Active Problems:   Mixed hyperlipidemia   Pre-diabetes   Essential hypertension   Cardiomyopathy, ischemic   Chest pain with moderate risk for cardiac etiology   1. Chest Pain with Moderate Risk for Cardiac Etiology: Potential etiologies include ischemic CP vs GERD, although symptoms more consistent with the latter. CP characterized as burning discomfort in substernal chest radiating to  his neck, after ingestion of a spicy meal for dinner.  Worse in supine position. Not exacerbated by exertion. No relief with SL NTG nor ASA.  This is different from his CP associated with prior MI in January, which was characterized as  Pressure/heaviness. His EKG is nonacute/ unchanged from prior. POC  troponin is negative. Recommend GI cocktail to see if any improvement in symptoms. Admit to observation given his cardiac history and cycle enzymes to r/o MI. If negative, can risk stratify with NST. MD to follow with further recommendations. Continue cardiac meds. Signed, Robbie Lis, PA-C 03/16/2016, 7:36 AM   Attending Note:   The patient was seen and examined.  Agree with assessment and plan as noted above.  Changes made to the above note as needed.  Patient seen and independently examined with Boyce Medici, PA .   We discussed all aspects of the encounter. I agree with the assessment and plan as stated above.  1.  CP:   Persistent CP.  Not at all like his previous episodes of angina when he presented with his MI Has had POC Troponin levels drawn but no regular troponin Will repeat his ECG No relief of pain with GI cocktail Not pleuretic.  Not positional   The POC troponin levels are very minimally elevated - if they continue to increase, will need to consider going for cath. Has continued to take his brilinta ( even though he has run out of his other meds )   2. HTN:   BP is a little elevated.   He has run out of his BP meds   3. Pre-diabetes:   Needs to work on diet and exercise and weigjht loss Further plans per his PCP   I have spent a total of 40 minutes with patient reviewing hospital  notes , telemetry, EKGs, labs and examining patient as well as establishing an assessment and plan that was discussed with the patient. > 50% of time was spent in direct patient care.  4.  Ischemic cardiomyopathy:   Needs to restart meds.    Vesta Mixer, Montez Hageman., MD, [facility] 03/16/2016,  9:43 AM [address],  [address] Office [phone] Pager [phone]

## 2016-03-16 NOTE — Interval H&P Note (Signed)
Cath Lab Visit (complete for each Cath Lab visit)  Clinical Evaluation Leading to the Procedure:   ACS: Yes.    Non-ACS:    Anginal Classification: CCS III  Anti-ischemic medical therapy: Maximal Therapy (2 or more classes of medications)  Non-Invasive Test Results: No non-invasive testing performed  Prior CABG: No previous CABG      History and Physical Interval Note:  03/16/2016 1:33 PM  Peter Russo  has presented today for surgery, with the diagnosis of cp  The various methods of treatment have been discussed with the patient and family. After consideration of risks, benefits and other options for treatment, the patient has consented to  Procedure(s): Left Heart Cath and Coronary Angiography (N/A) as a surgical intervention .  The patient's history has been reviewed, patient examined, no change in status, stable for surgery.  I have reviewed the patient's chart and labs.  Questions were answered to the patient's satisfaction.     Lyn Records III

## 2016-03-16 NOTE — Telephone Encounter (Addendum)
Patient currently in ED. Patient had questions about his letter. Will try to call patient when he is out of the hospital.

## 2016-03-16 NOTE — Progress Notes (Signed)
Dr Katrinka Blazing notified that patient had complained of chest pain one time this shift, Nitro gtt is infusing, has not voiced any other complaints of pain. Patient has been asleep but easily aroused. Dr Katrinka Blazing made aware, no other orders given. No s/s of distress noted or complaints voiced at this time. Call bell is in reach and bed is in lowest position. Patients's daughter is at bedside.

## 2016-03-16 NOTE — ED Provider Notes (Signed)
History of Present Illness   Patient Identification Peter Russo is a 40 y.o. male.  Patient information was obtained from patient, EMS personnel and EMR . History/Exam limitations: none. Patient presented to the Emergency Department by ambulance where the patient received 4 NTG prior to arrival.  Chief Complaint  Chest Pain (Pt reports that he began having chest pain at 0400 this morning.  )  CARDIOLOGIST: Dr.Nishan PCP: Dr. Luna Glasgow  The patient complains of chest pain. The discomfort is described as  Burning and pressure-like with radiation to the BL neck/jaw, and left arm.  Onset of symptoms was abrupt starting 2 hours ago, worsening course since that time. Pain is constant.. The patient denies nausea, vomiting and diaphoresis. Patient's cardiac risk factors are dyslipidemia, family history of premature cardiovascular disease, hypertension, male gender, obesity (BMI >= 30 kg/m2) and 3 previous MIs. The patient denies risk factors of dyslipidemia, family history of premature cardiovascular disease, hypertension, male gender and obesity (BMI >= 30 kg/m2); 3 previous MIs and 5 stents placed.  Care prior to arrival consisted of 2 nitroglycerines, with minimal relief.  Past Medical History:  Diagnosis Date  . CAD S/P percutaneous coronary angioplasty    a. s/p BMS-mLAD 2010. b. DES to RCA 2012. c. 04/2015: DES to LCx and PCI to LAD c/b dissection with subsequent overlapping DES to mLAD - r/i for NSTEMI afterwards.  . Depression   . Essential hypertension   . GAD (generalized anxiety disorder)   . GERD (gastroesophageal reflux disease)   . Hypercholesteremia   . Ischemic cardiomyopathy 09/14/2015   EF 25-35 percent at cath, 35-40 percent by echo  . Morbid obesity (HCC)   . Pre-diabetes   . ST elevation (STEMI) myocardial infarction involving left anterior descending coronary artery (HCC) 09/14/2015   3.0 x 16 mm Synergy DES to the LAD for ISR BMS  . SVT (supraventricular tachycardia)  (HCC) APRROX 10 YRS AGO   PRESUMED AVNRT, ECHO 12/07 WITH EF 65%, MILD LAE  . Tobacco abuse    Family History  Problem Relation Age of Onset  . Arrhythmia Mother     MOTHER LIVED TO BE 102  . Coronary artery disease Mother   . Heart attack Mother   . Hypertension Mother   . Coronary artery disease Father 38  . Heart disease Father   . Heart attack Father   . Heart attack Brother   . Stroke Neg Hx    Current Facility-Administered Medications  Medication Dose Route Frequency Provider Last Rate Last Dose  . aspirin chewable tablet 324 mg  324 mg Oral Once Arthor Captain, PA-C   Stopped at 03/16/16 4098  . morphine 4 MG/ML injection 4 mg  4 mg Intravenous Once Arthor Captain, PA-C      . nitroGLYCERIN (NITROSTAT) SL tablet 0.4 mg  0.4 mg Sublingual Q5 min PRN Arthor Captain, PA-C      . ondansetron (ZOFRAN) injection 4 mg  4 mg Intravenous Once Arthor Captain, PA-C       Current Outpatient Prescriptions  Medication Sig Dispense Refill  . ALPRAZolam (XANAX) 0.5 MG tablet Take 1 tablet (0.5 mg total) by mouth 2 (two) times daily as needed for anxiety. 30 tablet 0  . aspirin EC 81 MG tablet Take 81 mg by mouth daily.    Marland Kitchen atorvastatin (LIPITOR) 80 MG tablet Take 1 tablet (80 mg total) by mouth daily at 6 PM. 90 tablet 3  . ezetimibe (ZETIA) 10 MG tablet Take 1 tablet (10 mg  total) by mouth daily. 90 tablet 3  . fenofibrate (TRICOR) 48 MG tablet Take 1 tablet (48 mg total) by mouth daily. 30 tablet 11  . furosemide (LASIX) 20 MG tablet Take 0.5 tablets (10 mg total) by mouth daily. 15 tablet 11  . isosorbide mononitrate (IMDUR) 30 MG 24 hr tablet Take 1 tablet (30 mg total) by mouth daily. 90 tablet 3  . lisinopril (PRINIVIL,ZESTRIL) 5 MG tablet Take 1 tablet (5 mg total) by mouth daily. 90 tablet 3  . metoprolol tartrate (LOPRESSOR) 25 MG tablet Take 1 tablet (25 mg total) by mouth 2 (two) times daily. 60 tablet 11  . nitroGLYCERIN (NITROSTAT) 0.4 MG SL tablet Place 0.4 mg under the  tongue every 5 (five) minutes as needed for chest pain (3 x).    . sertraline (ZOLOFT) 50 MG tablet TAKE 1 TABLET BY MOUTH DAILY 30 tablet 2  . ticagrelor (BRILINTA) 90 MG TABS tablet Take 1 tablet (90 mg total) by mouth 2 (two) times daily. 60 tablet 11   No Known Allergies Social History   Social History  . Marital status: Single    Spouse name: N/A  . Number of children: N/A  . 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 Smoking status: Current Every Day Smoker    Packs/day: 1.00    Types: Cigarettes  . Smokeless tobacco: Not on file  . Alcohol use No  . Drug use:     Types: Marijuana  . Sexual activity: Not on file   Other Topics Concern  . Not on file   Social History Narrative   REGULAR EXERCISE   Review of Systems 10 systems are reviewed and are negative except as stated in the HPI   Physical Exam   There were no vitals taken for this visit. There were no vitals taken for this visit.  General Appearance:    Alert, + Levine's sign. Speaking in short phrases. Appears extremely uncomfortable.  Head:    Normocephalic, without obvious abnormality, atraumatic  Eyes:    PERRL, conjunctiva/corneas clear, EOM's intact, fundi    benign, both eyes       Ears:    Normal TM's and external ear canals, both ears  Nose:   Nares normal, septum midline, mucosa normal, no drainage    or sinus tenderness  Throat:   Lips, mucosa, and tongue normal; teeth and gums normal  Neck:   Supple, symmetrical, trachea midline, no adenopathy;       thyroid:  No enlargement/tenderness/nodules; no carotid   bruit or JVD  Back:     Symmetric, no curvature, ROM normal, no CVA tenderness  Lungs:     Clear to auscultation bilaterally, respirations unlabored  Chest wall:    No tenderness or deformity  Heart:    Regular rate and rhythm, S1 and S2 normal, no murmur, rub   or gallop  Abdomen:     Soft, non-tender, bowel sounds active  all four quadrants,    no masses, no organomegaly  Genitalia:    Normal male without lesion, discharge or tenderness  Rectal:    Normal tone, normal prostate, no masses or tenderness;   guaiac negative stool  Extremities:   Extremities normal, atraumatic, no cyanosis or edema  Pulses:   2+ and symmetric all extremities  Skin:   Skin color, texture, turgor normal, no rashes or lesions  Lymph nodes:   Cervical, supraclavicular, and  axillary nodes normal  Neurologic:   CNII-XII intact. Normal strength, sensation and reflexes      throughout     ED Course   Clinical Course  Value Comment By Time  EKG 12-Lead Patient With HIGH RISK for MACE. He is greatly improved after morphine. EKG is unchanged from previous Arthor Captain, PA-C 08/02 0701   I stat troponin is negative Arthor Captain, PA-C 08/02 0702  Troponin i, poc: 0.08 0.08 patient still with sig. Pain even after GI I stat troponin is increasing Arthor Captain, PA-C 08/02 1610   .Critical Care Performed by: Arthor Captain Authorized by: Arthor Captain   Critical care provider statement:    Critical care time (minutes):  60   Critical care time was exclusive of:  Separately billable procedures and treating other patients   Critical care was necessary to treat or prevent imminent or life-threatening deterioration of the following conditions:  Cardiac failure   Critical care was time spent personally by me on the following activities:  Development of treatment plan with patient or surrogate, discussions with consultants, evaluation of patient's response to treatment, examination of patient, interpretation of cardiac output measurements, obtaining history from patient or surrogate, review of old charts, re-evaluation of patient's condition, pulse oximetry, ordering and review of radiographic studies, ordering and review of laboratory studies and ordering and performing treatments and interventions      Studies:  Results for  orders placed or performed during the hospital encounter of 03/16/16  Basic metabolic panel  Result Value Ref Range   Sodium 137 135 - 145 mmol/L   Potassium 3.9 3.5 - 5.1 mmol/L   Chloride 105 101 - 111 mmol/L   CO2 23 22 - 32 mmol/L   Glucose, Bld 111 (H) 65 - 99 mg/dL   BUN 11 6 - 20 mg/dL   Creatinine, Ser 9.60 0.61 - 1.24 mg/dL   Calcium 9.3 8.9 - 45.4 mg/dL   GFR calc non Af Amer >60 >60 mL/min   GFR calc Af Amer >60 >60 mL/min   Anion gap 9 5 - 15  CBC  Result Value Ref Range   WBC 9.9 4.0 - 10.5 K/uL   RBC 4.89 4.22 - 5.81 MIL/uL   Hemoglobin 14.4 13.0 - 17.0 g/dL   HCT 09.8 11.9 - 14.7 %   MCV 85.5 78.0 - 100.0 fL   MCH 29.4 26.0 - 34.0 pg   MCHC 34.4 30.0 - 36.0 g/dL   RDW 82.9 56.2 - 13.0 %   Platelets 238 150 - 400 K/uL  Troponin I (q 6hr x 3)  Result Value Ref Range   Troponin I 0.34 (HH) <0.03 ng/mL  I-stat troponin, ED  Result Value Ref Range   Troponin i, poc 0.06 0.00 - 0.08 ng/mL   Comment 3          I-stat troponin, ED  Result Value Ref Range   Troponin i, poc 0.08 0.00 - 0.08 ng/mL   Comment 3            Records Reviewed: Old medical records. Previous electrocardiograms. Nursing notes.  Treatments: Aspirin given. Morphine, nitro  Consultations: Cardiology consulted. Treatment options were discussed and plan of care agreed upon.  Disposition: Patient admitted for cardiac cath.   Impression:  NSTEMI  Patient with elevating troponins in the setting of known CAD and persistent unresolved CP. The patient is a high risk patient. He will be taken to the Cath lab by Dr. Elease Hashimoto. Repeat EKGs show mild  deepening of his T wave inversions. Pt stable in ED with no significant deterioration in condition.    Arthor Captain, PA-C 03/16/16 3810    Geoffery Lyons, MD 03/16/16 [phone]

## 2016-03-16 NOTE — Care Management Note (Signed)
Case Management Note  Patient Details  Name: Peter Russo MRN: [id number] Date of Birth: 01/22/1976  Subjective/Objective:  Patient s /p balloon angioplasty, will follow for  medication ast , NCM will cont to follow for dc needs.                 Action/Plan:   Expected Discharge Date:                  Expected Discharge Plan:  Home/Self Care  In-House Referral:     Discharge planning Services  CM Consult, Medication Assistance  Post Acute Care Choice:    Choice offered to:     DME Arranged:    DME Agency:     HH Arranged:    HH Agency:     Status of Service:  In process, will continue to follow  If discussed at Long Length of Stay Meetings, dates discussed:    Additional Comments:  Leone Haven, RN 03/16/2016, 5:08 PM

## 2016-03-16 NOTE — H&P (View-Only) (Signed)
Patient ID: Peter Russo MRN: [id number], DOB/AGE: 02-06-1976   Admit date: 03/16/2016   Reason for Consult: Chest Pain Requesting MD: Dr. Judd Lien, [facility] ED   Primary Physician: Ambrose Finland, NP Primary Cardiologist: Dr. Eden Emms   Pt. Profile:  40 y/o male with h/o CAD, ischemic cardiomyopathy (EF 35-40%), HTN, HLD and pre-diabetes, obesity and GERD presenting to the ED with CP.    Problem List  Past Medical History:  Diagnosis Date  . CAD S/P percutaneous coronary angioplasty    a. s/p BMS-mLAD 2010. b. DES to RCA 2012. c. 04/2015: DES to LCx and PCI to LAD c/b dissection with subsequent overlapping DES to mLAD - r/i for NSTEMI afterwards.  . Depression   . Essential hypertension   . GAD (generalized anxiety disorder)   . GERD (gastroesophageal reflux disease)   . Hypercholesteremia   . Ischemic cardiomyopathy 09/14/2015   EF 25-35 percent at cath, 35-40 percent by echo  . Morbid obesity (HCC)   . Pre-diabetes   . ST elevation (STEMI) myocardial infarction involving left anterior descending coronary artery (HCC) 09/14/2015   3.0 x 16 mm Synergy DES to the LAD for ISR BMS  . SVT (supraventricular tachycardia) (HCC) APRROX 10 YRS AGO   PRESUMED AVNRT, ECHO 12/07 WITH EF 65%, MILD LAE  . Tobacco abuse     Past Surgical History:  Procedure Laterality Date  . CARDIAC CATHETERIZATION N/A 04/23/2015   Procedure: Left Heart Cath and Coronary Angiography;  Surgeon: Wendall Stade, MD;  Location: [facility] INVASIVE CV LAB;  Service: Cardiovascular;  Laterality: N/A;  . CARDIAC CATHETERIZATION N/A 04/23/2015   Procedure: Coronary Stent Intervention;  Surgeon: Tonny Bollman, MD;  Location: [facility] INVASIVE CV LAB;  Service: Cardiovascular;  Laterality: N/A;  . CARDIAC CATHETERIZATION N/A 09/14/2015   Procedure: Left Heart Cath and Coronary Angiography;  Surgeon: Lyn Records, MD;  LAD 100%, CFX 10% ISR, RCA 100% (chronic), EF 25-35%  . CARDIAC CATHETERIZATION N/A 09/14/2015   Procedure: Coronary  Stent Intervention;  Surgeon: Lyn Records, MD;  Location: [facility] INVASIVE CV LAB;  Service: Cardiovascular;  Laterality: N/A; 3.0 x 16 mm Synergy DES LAD  . CORONARY STENT PLACEMENT       Allergies  No Known Allergies  HPI  40 y/o male with h/o CAD, ischemic cardiomyopathy (EF 35-40%), HTN, HLD,  pre-diabetes, obesity and GERD presenting to the ED with CP.   Patient was last admitted 09/14/15 with a STEMI due to occlusion of the mid LAD within a previously placed BMS. He underwent successful PTCA and restenting of the mid LAD. The distal LAD, which was previously stented, was widely patent.  The circumflex stent widely patent. The  RCA chronically occluded, but fills by left to right collaterals.  LV systolic dysfunction was noted on cath. EF 25-35%. EF was 35-40% by 2-D echo with grade 2 diastolic dysfunction. Per notes, he was supposed to have an MRI in April to risk stratify for AICD but did not get it, due to lack of insurance. He has also had issues with medication compliance to due no insurance and difficulty affording medications. He was last seen in clinic by Dr. Eden Emms 02/01/16. He was stable w/o CP. Continued medical therapy was recommended. He was instructed to f/u in 3 months.   He presented to the [facility] ED this morning with complaint of CP. Symptoms started roughly at 4:30 AM and awoke him from his sleep. Described as substernal burning sensation radiating up into his  throat and left chest. No associated dyspnea, diaphoresis, n/v, syncope/ near syncope. This is different from his CP associated with prior MI in January, which was characterized as  Pressure/heaviness. Current CP is worse lying down. Relieved sitting up. Nonexertional, in fact he walked up and down the stairs in his home this morning w/o exacerbation of symptoms. He admits that he ate a spicy meal around 7:30 PM last night. He ate cabbage seasoned with red pepper flakes. His girlfriend noted that it was really spicy. He had no  improvement in pain with SL NTG x 3. No improvement with ASA. Mild improvement with morphine but still with discomfort. He has been compliant with ASA + Brilinta.    EKG shows NSR. Borderline ST elevations in lateral leads, but no significant change from previous EKG 09/2015. POC troponin is mildly elevated.   Regular troponin levels have not been drawn.   BMP and CBC pending. CXR unremarkable. BP moderately elevated in the 150s.   Started smoking again  !   Home Medications  Prior to Admission medications   Medication Sig Start Date End Date Taking? Authorizing Provider  atorvastatin (LIPITOR) 80 MG tablet Take 1 tablet (80 mg total) by mouth daily at 6 PM. 09/11/15  Yes Leone Brand, NP  fenofibrate (TRICOR) 48 MG tablet Take 1 tablet (48 mg total) by mouth daily. 07/08/15  Yes Dayna N Dunn, PA-C  lisinopril (PRINIVIL,ZESTRIL) 5 MG tablet Take 1 tablet (5 mg total) by mouth daily. 09/22/15  Yes Dyann Kief, PA-C  nitroGLYCERIN (NITROSTAT) 0.4 MG SL tablet Place 0.4 mg under the tongue every 5 (five) minutes as needed for chest pain (3 x).   Yes Historical Provider, MD  ticagrelor (BRILINTA) 90 MG TABS tablet Take 1 tablet (90 mg total) by mouth 2 (two) times daily. 09/18/15  Yes Rhonda G Barrett, PA-C  ALPRAZolam (XANAX) 0.5 MG tablet Take 1 tablet (0.5 mg total) by mouth 2 (two) times daily as needed for anxiety. Patient not taking: Reported on 03/16/2016 12/26/14   Ambrose Finland, NP  ezetimibe (ZETIA) 10 MG tablet Take 1 tablet (10 mg total) by mouth daily. Patient not taking: Reported on 03/16/2016 09/11/15   Leone Brand, NP  furosemide (LASIX) 20 MG tablet Take 0.5 tablets (10 mg total) by mouth daily. Patient not taking: Reported on 03/16/2016 10/27/15   Wendall Stade, MD  isosorbide mononitrate (IMDUR) 30 MG 24 hr tablet Take 1 tablet (30 mg total) by mouth daily. Patient not taking: Reported on 03/16/2016 09/11/15   Leone Brand, NP  metoprolol tartrate (LOPRESSOR) 25 MG tablet Take 1  tablet (25 mg total) by mouth 2 (two) times daily. Patient not taking: Reported on 03/16/2016 04/25/15   Janetta Hora, PA-C  sertraline (ZOLOFT) 50 MG tablet TAKE 1 TABLET BY MOUTH DAILY Patient not taking: Reported on 03/16/2016 02/01/16   Quentin Angst, MD    Family History  Family History  Problem Relation Age of Onset  . Arrhythmia Mother     MOTHER LIVED TO BE 102  . Coronary artery disease Mother   . Heart attack Mother   . Hypertension Mother   . Coronary artery disease Father 69  . Heart disease Father   . Heart attack Father   . Heart attack Brother   . Stroke Neg Hx     Social History  Social History   Social History  . Marital status: Single    Spouse name: N/A  .  Number of children: N/A  . 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 Smoking status: Current Every Day Smoker    Packs/day: 1.00    Types: Cigarettes  . Smokeless tobacco: Not on file  . Alcohol use No  . Drug use:     Types: Marijuana  . Sexual activity: Not on file   Other Topics Concern  . Not on file   Social History Narrative   REGULAR EXERCISE     Review of Systems General:  No chills, fever, night sweats or weight changes.  Cardiovascular:  No chest pain, dyspnea on exertion, edema, orthopnea, palpitations, paroxysmal nocturnal dyspnea. Dermatological: No rash, lesions/masses Respiratory: No cough, dyspnea Urologic: No hematuria, dysuria Abdominal:   No nausea, vomiting, diarrhea, bright red blood per rectum, melena, or hematemesis Neurologic:  No visual changes, wkns, changes in mental status. All other systems reviewed and are otherwise negative except as noted above.  Physical Exam  Blood pressure 153/86, pulse (!) 59, resp. rate 10, SpO2 97 %.  General: Pleasant, NAD, moderately obese Psych: Normal affect. Neuro: Alert and oriented X 3. Moves all extremities spontaneously. HEENT: Normal  Neck:  Supple without bruits or JVD. Lungs:  Resp regular and unlabored, CTA. Heart: RRR no s3, s4, or murmurs. Abdomen: Soft, non-tender, non-distended, BS + x 4.  Extremities: No clubbing, cyanosis or edema. DP/PT/Radials 2+ and equal bilaterally.  Labs  Troponin [facility] of Care Test)  Recent Labs  03/16/16 0635  TROPIPOC 0.06   No results for input(s): CKTOTAL, CKMB, TROPONINI in the last 72 hours. Lab Results  Component Value Date   WBC 13.1 (H) 09/15/2015   HGB 13.0 09/15/2015   HCT 37.0 (L) 09/15/2015   MCV 84.3 09/15/2015   PLT 199 09/15/2015   No results for input(s): NA, K, CL, CO2, BUN, CREATININE, CALCIUM, PROT, BILITOT, ALKPHOS, ALT, AST, GLUCOSE in the last 168 hours.  Invalid input(s): LABALBU Lab Results  Component Value Date   CHOL 215 (H) 09/14/2015   HDL 30 (L) 09/14/2015   LDLCALC 139 (H) 09/14/2015   TRIG 228 (H) 09/14/2015   No results found for: DDIMER   Radiology/Studies  Dg Chest Port 1 View  Result Date: 03/16/2016 CLINICAL DATA:  40 year old male with chest pain EXAM: PORTABLE CHEST 1 VIEW COMPARISON:  Chest radiograph dated 12/19/2014 FINDINGS: Single portable view of chest does not demonstrate a focal consolidation. There is no pleural effusion or pneumothorax. Stable top-normal cardiac silhouette. No acute osseous pathology. IMPRESSION: No active disease. Electronically Signed   By: Elgie Collard M.D.   On: 03/16/2016 06:43    ECG  NSR. Borderline ST elevations in lateral leads, but no significant change from previous EKG 09/2015.    ASSESSMENT AND PLAN  Active Problems:   Mixed hyperlipidemia   Pre-diabetes   Essential hypertension   Cardiomyopathy, ischemic   Chest pain with moderate risk for cardiac etiology   1. Chest Pain with Moderate Risk for Cardiac Etiology: Potential etiologies include ischemic CP vs GERD, although symptoms more consistent with the latter. CP characterized as burning discomfort in substernal chest radiating to  his neck, after ingestion of a spicy meal for dinner.  Worse in supine position. Not exacerbated by exertion. No relief with SL NTG nor ASA.  This is different from his CP associated with prior MI in January, which was characterized as  Pressure/heaviness. His EKG is nonacute/ unchanged from prior. POC  troponin is negative. Recommend GI cocktail to see if any improvement in symptoms. Admit to observation given his cardiac history and cycle enzymes to r/o MI. If negative, can risk stratify with NST. MD to follow with further recommendations. Continue cardiac meds. Signed, Robbie Lis, PA-C 03/16/2016, 7:36 AM   Attending Note:   The patient was seen and examined.  Agree with assessment and plan as noted above.  Changes made to the above note as needed.  Patient seen and independently examined with Boyce Medici, PA .   We discussed all aspects of the encounter. I agree with the assessment and plan as stated above.  1.  CP:   Persistent CP.  Not at all like his previous episodes of angina when he presented with his MI Has had POC Troponin levels drawn but no regular troponin Will repeat his ECG No relief of pain with GI cocktail Not pleuretic.  Not positional   The POC troponin levels are very minimally elevated - if they continue to increase, will need to consider going for cath. Has continued to take his brilinta ( even though he has run out of his other meds )   2. HTN:   BP is a little elevated.   He has run out of his BP meds   3. Pre-diabetes:   Needs to work on diet and exercise and weigjht loss Further plans per his PCP   I have spent a total of 40 minutes with patient reviewing hospital  notes , telemetry, EKGs, labs and examining patient as well as establishing an assessment and plan that was discussed with the patient. > 50% of time was spent in direct patient care.  4.  Ischemic cardiomyopathy:   Needs to restart meds.    Vesta Mixer, Montez Hageman., MD, [facility] 03/16/2016,  9:43 AM [address],  [address] Office [phone] Pager [phone]

## 2016-03-17 ENCOUNTER — Encounter (HOSPITAL_COMMUNITY): Payer: Self-pay | Admitting: Physician Assistant

## 2016-03-17 DIAGNOSIS — I249 Acute ischemic heart disease, unspecified: Secondary | ICD-10-CM

## 2016-03-17 DIAGNOSIS — E782 Mixed hyperlipidemia: Secondary | ICD-10-CM

## 2016-03-17 LAB — TROPONIN I
Troponin I: 15.33 ng/mL (ref <0.03)
Troponin I: 13.26 ng/mL (ref <0.03)

## 2016-03-17 LAB — BASIC METABOLIC PANEL
Anion gap: 8 (ref 5–15)
BUN: 9 mg/dL (ref 6–20)
CO2: 26 mmol/L (ref 22–32)
Calcium: 8.7 mg/dL — ABNORMAL LOW (ref 8.9–10.3)
Chloride: 102 mmol/L (ref 101–111)
Creatinine, Ser: 1.08 mg/dL (ref 0.61–1.24)
GFR calc Af Amer: >60 mL/min (ref >60)
GFR calc non Af Amer: >60 mL/min (ref >60)
Glucose, Bld: 115 mg/dL — ABNORMAL HIGH (ref 65–99)
Potassium: 4.1 mmol/L (ref 3.5–5.1)
Sodium: 136 mmol/L (ref 135–145)

## 2016-03-17 LAB — GLUCOSE, CAPILLARY: Glucose-Capillary: 97 mg/dL (ref 65–99)

## 2016-03-17 LAB — CBC
HCT: 39.5 % (ref 39.0–52.0)
Hemoglobin: 13.4 g/dL (ref 13.0–17.0)
MCH: 29.1 pg (ref 26.0–34.0)
MCHC: 33.9 g/dL (ref 30.0–36.0)
MCV: 85.9 fL (ref 78.0–100.0)
Platelets: 212 10*3/uL (ref 150–400)
RBC: 4.6 MIL/uL (ref 4.22–5.81)
RDW: 13.5 % (ref 11.5–15.5)
WBC: 10.3 10*3/uL (ref 4.0–10.5)

## 2016-03-17 LAB — MAGNESIUM: Magnesium: 1.8 mg/dL (ref 1.7–2.4)

## 2016-03-17 MED ORDER — CARVEDILOL 3.125 MG PO TABS
6.2500 mg | ORAL_TABLET | Freq: Two times a day (BID) | ORAL | Status: DC
  Administered 2016-03-17: 10:00:00 6.25 mg via ORAL
  Filled 2016-03-17: qty 2

## 2016-03-17 MED ORDER — ASPIRIN 81 MG PO TBEC: 81.0000 mg | DELAYED_RELEASE_TABLET | Freq: Every day | ORAL | 11 refills | Status: DC

## 2016-03-17 MED ORDER — CARVEDILOL 6.25 MG PO TABS: 6.2500 mg | ORAL_TABLET | Freq: Two times a day (BID) | ORAL | 11 refills | Status: DC

## 2016-03-17 MED FILL — Verapamil HCl IV Soln 2.5 MG/ML: INTRAVENOUS | Qty: 2 | Status: AC

## 2016-03-17 MED FILL — LISINOPRIL 5 MG TABLET: 5 | 30 days supply | Qty: 30 | Fill #2

## 2016-03-17 MED FILL — NITROSTAT 0.4 MG TABLET SL: 0.4 | 25 days supply | Qty: 25 | Fill #1

## 2016-03-17 MED FILL — ATORVASTATIN 80 MG TABLET: 80 | 30 days supply | Qty: 30 | Fill #2

## 2016-03-17 MED FILL — CARVEDILOL 6.25 MG TABLET: 6.25 | 30 days supply | Qty: 60 | Fill #0

## 2016-03-17 MED FILL — SERTRALINE HCL 50 MG TABLET: 50 | 30 days supply | Qty: 30 | Fill #0

## 2016-03-17 MED FILL — BRILINTA 90 MG TABLET: 90 | 30 days supply | Qty: 60 | Fill #2

## 2016-03-17 MED FILL — FENOFIBRATE 48 MG TABLET: 48 | 30 days supply | Qty: 30 | Fill #2

## 2016-03-17 NOTE — Discharge Summary (Signed)
Discharge Summary    Patient ID: Peter Russo,  MRN: [id number], DOB/AGE: 09-07-1975 40 y.o.  Admit date: 03/16/2016 Discharge date: 03/17/2016  Primary Care Provider: Ambrose Finland Primary Cardiologist: Dr. Eden Emms   Discharge Diagnoses    Principal Problem:   Chest pain with moderate risk for cardiac etiology Active Problems:   NSTEMI   Mixed hyperlipidemia   Pre-diabetes   Essential hypertension   Cardiomyopathy, ischemic   Acute on chronic systolic CHF   ACS (acute coronary syndrome) (HCC)   Chest pain   Allergies No Known Allergies  Diagnostic Studies/Procedures    Coronary Balloon Angioplasty  Left Heart Cath and Coronary Angiography  Conclusion    Total occlusion of the apical LAD beyond the previously placed distal stent.  Patent proximal to distal LAD stents previously placed  Patent mid circumflex stent.  Total occlusion of the proximal to mid RCA. Right right and left-to-right collaterals, with faint opacification.  Successful balloon only intervention on the distal total occlusion in the LAD reducing the 100% stenosis to 60% with TIMI grade 3 flow. We chose against additional stenting because of the past recurrent stent failure in each vascular territory.  Marked reduction in LV function with estimated ejection fraction 25% with elevated end-diastolic pressure.  Recommendations:   IV nitroglycerin 12-24 hours for blood pressure control and to improve micro-circulatory flow  Continue dual antiplatelet therapy.  Optimization of heart failure therapy  Consider AICD and eventual advanced heart failure team evaluation.  Overall prognosis is guarded. Will need case management and evaluation of home situation to verify medication use.      History of Present Illness     40 y/o male with h/o CAD, ischemic cardiomyopathy (EF 35-40%-->25% on cath 03/16/16), HTN, HLD and pre-diabetes, obesity and GERD presenting to the ED with CP.   Patient  was last admitted 09/14/15 with a STEMI due to occlusion of the mid LAD within a previously placed BMS. He underwent successful PTCA and restenting of the mid LAD. The distal LAD, which was previously stented, was widely patent.  The circumflex stent widely patent. The  RCA chronically occluded, but fills by left to right collaterals.  LV systolic dysfunction was noted on cath. EF 25-35%. EF was 35-40% by 2-D echo with grade 2 diastolic dysfunction. Per notes, he was supposed to have an MRI in April to risk stratify for AICD but did not get it, due to lack of insurance. He has also had issues with medication compliance to due no insurance and difficulty affording medications. He was last seen in clinic by Dr. Eden Emms 02/01/16. He was stable w/o CP. Continued medical therapy was recommended. He was instructed to f/u in 3 months.   He presented to the [facility] ED 03/16/16 morning with complaint of CP. Symptoms started roughly at 4:30 AM and awoke him from his sleep. Described as substernal burning sensation radiating up into his throat and left chest. No associated dyspnea, diaphoresis, n/v, syncope/ near syncope. This is different from his CP associated with prior MI in January, which was characterized as  Pressure/heaviness. CP is worse lying down. Relieved sitting up. Nonexertional, in fact he walked up and down the stairs in his home this morning w/o exacerbation of symptoms. He admits that he ate a spicy meal around 7:30 PM last night. He ate cabbage seasoned with red pepper flakes. His girlfriend noted that it was really spicy. He had no improvement in pain with SL NTG x 3. No  improvement with ASA. Mild improvement with morphine but still with discomfort. He has been compliant with ASA + Brilinta.   EKG shows NSR. Borderline ST elevations in lateral leads, but no significant change from previous EKG 09/2015. POC troponin is mildly elevated.  Hospital Course     Consultants: None  The patient admitted and first  troponin I was 0.34.   1. Due to symptoms concerning for unstable angina the patient taken for cardiac catheterization that showed patient proximal to distal LAD stent, total occulusion of apical LAD beyond previously placed stent. Patent mid circumflex stent. Total occlusion of the proximal to mid RCA with right right and left-to-right collaterals, with faint opacification. S/p successful balloon only intervention on the distal total occlusion in the LAD reducing the 100% stenosis to 60% with TIMI grade 3 flow. No stenting done because of the past recurrent stent failure in each vascular territory. Overnight chest pain that resolved by it self. Peak of troponin 15.33 then trended down.   2. Acute on chronic systolic CHF/ Ischemic cardiomyopathy - EF was 35-40% on echo 09/17/15. Now 25% on cath 03/16/16. Likely due to medication non compliance as he hasn't took for past few weeks.  Continue ASA, Brillinta, lisinopril and statin. Will start coreg 6.25mg  BID.  He should be on medical therapy before ICD consideration. Repeat echo in 3 months. Dr. Elease Hashimoto felt that he will not needed  LifeVest as well. He appears euvolemic thus home lasix did not started. Please restart lasix vs spironolactone during TCM appointment.   3. HTN - Elevated at time. He was on metoprolol 25mg  BID previously. Telemetry shoes rate intermittently goes to low 50s to high 120s. Changed to coreg. Continued lisinopril.   4. NSVT - 2 episode overnight. 6 beats and 10 beats. Resumed BB as described above. K and Mg were normal.   5. HLD - 09/14/2015: Cholesterol 215; HDL 30; LDL Cholesterol 139; Triglycerides 228; VLDL 46. Will get lipid panel tomorrow AM however patient discharged.  Continue statin. Will need LFT and lipid panel during TCM appointment.   The patient has been seen by Dr. Elease Hashimoto today and deemed ready for discharge home. All follow-up appointments have been scheduled. Discharge medications are listed below.  _____________    Discharge Vitals Blood pressure (!) 164/107, pulse 60, temperature 98.1 F (36.7 C), temperature source Oral, resp. rate 17, weight 266 lb 5.1 oz (120.8 kg), SpO2 100 %.  Filed Weights   03/16/16 1115 03/17/16 0600  Weight: 263 lb 14.3 oz (119.7 kg) 266 lb 5.1 oz (120.8 kg)    Labs & Radiologic Studies     CBC  Recent Labs  03/16/16 1557 03/17/16 0351  WBC 10.1 10.3  HGB 14.2 13.4  HCT 41.9 39.5  MCV 86.0 85.9  PLT 213 212   Basic Metabolic Panel  Recent Labs  03/16/16 0628 03/16/16 1557 03/17/16 0351 03/17/16 0938  NA 137  --  136  --   K 3.9  --  4.1  --   CL 105  --  102  --   CO2 23  --  26  --   GLUCOSE 111*  --  115*  --   BUN 11  --  9  --   CREATININE 1.14 1.14 1.08  --   CALCIUM 9.3  --  8.7*  --   MG  --   --   --  1.8   Liver Function Tests No results for input(s): AST, ALT, ALKPHOS, BILITOT, PROT,  ALBUMIN in the last 72 hours. No results for input(s): LIPASE, AMYLASE in the last 72 hours. Cardiac Enzymes   Dg Chest Port 1 View  Result Date: 03/16/2016 CLINICAL DATA:  40 year old male with chest pain EXAM: PORTABLE CHEST 1 VIEW COMPARISON:  Chest radiograph dated 12/19/2014 FINDINGS: Single portable view of chest does not demonstrate a focal consolidation. There is no pleural effusion or pneumothorax. Stable top-normal cardiac silhouette. No acute osseous pathology. IMPRESSION: No active disease. Electronically Signed   By: Elgie Collard M.D.   On: 03/16/2016 06:43    Disposition   Pt is being discharged home today in good condition.  Follow-up Plans & Appointments    Follow-up Information    [facility] Follow up on 03/24/2016.   Why:  Transitional Care Clinic appointment on 03/24/16 at 11:00 am with Dr. Venetia Night. Contact information: [address] [place] [address] [phone]       Cline Crock, PA-C. Go on 03/30/2016.   Specialties:  Cardiology, Radiology Why:  :00 for TCM   Contact information: [address] [place] [address] [phone]          Discharge Instructions    Amb Referral to Cardiac Rehabilitation    Complete by:  As directed   Diagnosis:  PTCA   Diet - low sodium heart healthy    Complete by:  As directed   Discharge instructions    Complete by:  As directed   NO HEAVY LIFTING (>10lbs) X 2 WEEKS. NO SEXUAL ACTIVITY X 2 WEEKS. NO DRIVING X 1 WEEK. NO SOAKING BATHS, HOT TUBS, POOLS, ETC., X 7 DAYS.   Increase activity slowly    Complete by:  As directed      Discharge Medications   Discharge Medication List as of 03/17/2016 12:43 PM    START taking these medications   Details  aspirin 81 MG EC tablet Take 1 tablet (81 mg total) by mouth daily., Starting Thu 03/17/2016, Normal    carvedilol (COREG) 6.25 MG tablet Take 1 tablet (6.25 mg total) by mouth 2 (two) times daily with a meal., Starting Thu 03/17/2016, Normal      CONTINUE these medications which have NOT CHANGED   Details  ALPRAZolam (XANAX) 0.5 MG tablet Take 1 tablet (0.5 mg total) by mouth 2 (two) times daily as needed for anxiety., Starting 12/26/2014, Until Discontinued, Print    atorvastatin (LIPITOR) 80 MG tablet Take 1 tablet (80 mg total) by mouth daily at 6 PM., Starting Fri 09/11/2015, Normal    fenofibrate (TRICOR) 48 MG tablet Take 1 tablet (48 mg total) by mouth daily., Starting Wed 07/08/2015, Normal    lisinopril (PRINIVIL,ZESTRIL) 5 MG tablet Take 1 tablet (5 mg total) by mouth daily., Starting Tue 09/22/2015, Normal    nitroGLYCERIN (NITROSTAT) 0.4 MG SL tablet Place 0.4 mg under the tongue every 5 (five) minutes as needed for chest pain (3 x)., Historical Med    sertraline (ZOLOFT) 50 MG tablet TAKE 1 TABLET BY MOUTH DAILY, Normal    ticagrelor (BRILINTA) 90 MG TABS tablet Take 1 tablet (90 mg total) by mouth 2 (two) times daily., Starting Fri 09/18/2015, Normal      STOP taking these medications     ezetimibe (ZETIA) 10 MG tablet        furosemide (LASIX) 20 MG tablet      isosorbide mononitrate (IMDUR) 30 MG 24 hr tablet      metoprolol tartrate (LOPRESSOR) 25  MG tablet          Aspirin prescribed at discharge?  Yes High Intensity Statin Prescribed? (Lipitor 40-80mg  or Crestor 20-40mg ): Yes Beta Blocker Prescribed? Yes For EF 45% or less, Was ACEI/ARB Prescribed? Yes ADP Receptor Inhibitor Prescribed? (i.e. Plavix etc.-Includes Medically Managed Patients): Yes For EF <40%, Aldosterone Inhibitor Prescribed? No. Not in heart failure Was EF assessed during THIS hospitalization? Yes by cath  Was Cardiac Rehab II ordered? (Included Medically managed Patients): Yes   Outstanding Labs/Studies   Will need lipid panel and LFT during next office visit.   Duration of Discharge Encounter   Greater than 30 minutes including physician time.  Signed, Bhagat,Bhavinkumar PA-C 03/17/2016, 1:11 PM  Attending Note:   The patient was seen and examined.  Agree with assessment and plan as noted above.  Changes made to the above note as needed.  Patient seen and independently examined with Chelsea Aus, PA .   We discussed all aspects of the encounter. I agree with the assessment and plan as stated above.  Pt has done well following his PCI No angina  Needs to stay on his meds - has stopped all of his CHF meds     I have spent a total of 40 minutes with patient reviewing hospital  notes , telemetry, EKGs, labs and examining patient as well as establishing an assessment and plan that was discussed with the patient. > 50% of time was spent in direct patient care.    Vesta Mixer, Montez Hageman., MD, [facility] 03/17/2016, 1:20 PM [address],  [address] Office [phone] Pager [phone]

## 2016-03-17 NOTE — Progress Notes (Signed)
Patient Name: Peter Russo Date of Encounter: 03/17/2016   SUBJECTIVE  Overnight chest pain, now resolved. No sob or palpitations.   CURRENT MEDS . aspirin  324 mg Oral Once  . aspirin EC  81 mg Oral Daily  . atorvastatin  80 mg Oral q1800  . enoxaparin (LOVENOX) injection  40 mg Subcutaneous Q24H  . fenofibrate  54 mg Oral Daily  . lisinopril  5 mg Oral Daily  . sodium chloride flush  3 mL Intravenous Q12H  . sodium chloride flush  3 mL Intravenous Q12H  . ticagrelor  90 mg Oral BID    OBJECTIVE  Vitals:   03/17/16 0230 03/17/16 0300 03/17/16 0600 03/17/16 0730  BP: (!) 149/76 (!) 143/74 107/62 (!) 164/107  Pulse:   (!) 56 60  Resp: (!) Temp:   98.3 F (36.8 C)   TempSrc:   Axillary   SpO2:   100% 100%  Weight:   266 lb 5.1 oz (120.8 kg)     Intake/Output Summary (Last 24 hours) at 03/17/16 0845 Last data filed at 03/16/16 2309  Gross per 24 hour  Intake             1935 ml  Output             2500 ml  Net             -565 ml   Filed Weights   03/16/16 1115 03/17/16 0600  Weight: 263 lb 14.3 oz (119.7 kg) 266 lb 5.1 oz (120.8 kg)    PHYSICAL EXAM  General: Pleasant, NAD. Neuro: Alert and oriented X 3. Moves all extremities spontaneously. Psych: Normal affect. HEENT:  Normal  Neck: Supple without bruits or JVD. Lungs:  Resp regular and unlabored, CTA. Heart: RRR no s3, s4, or murmurs. Abdomen: Soft, non-tender, non-distended, BS + x 4.  Extremities: No clubbing, cyanosis or edema. DP/PT/Radials 2+ and equal bilaterally. R radial cath site stable.   Accessory Clinical Findings  CBC  Recent Labs  03/16/16 1557 03/17/16 0351  WBC 10.1 10.3  HGB 14.2 13.4  HCT 41.9 39.5  MCV 86.0 85.9  PLT 213 212   Basic Metabolic Panel  Recent Labs  03/16/16 0628 03/16/16 1557 03/17/16 0351  NA 137  --  136  K 3.9  --  4.1  CL 105  --  102  CO2 23  --  26  GLUCOSE 111*  --  115*  BUN 11  --  9  CREATININE 1.14 1.14 1.08  CALCIUM  9.3  --  8.7*   Liver Function Tests No results for input(s): AST, ALT, ALKPHOS, BILITOT, PROT, ALBUMIN in the last 72 hours. No results for input(s): LIPASE, AMYLASE in the last 72 hours. Cardiac Enzymes  Recent Labs  03/16/16 1557 03/16/16 2110 03/17/16 0351  TROPONINI 2.41* 10.76* 15.33*   BNP Invalid input(s): POCBNP D-Dimer No results for input(s): DDIMER in the last 72 hours. Hemoglobin A1C No results for input(s): HGBA1C in the last 72 hours. Fasting Lipid Panel No results for input(s): CHOL, HDL, LDLCALC, TRIG, CHOLHDL, LDLDIRECT in the last 72 hours. Thyroid Function Tests No results for input(s): TSH, T4TOTAL, T3FREE, THYROIDAB in the last 72 hours.  Invalid input(s): FREET3  TELE  Sinus rhythm with PACs. Rate mostly in 70-80s, occasionally in 120s. NSVT x 2 episode.   Radiology/Studies  Dg Chest Port 1 View  Result Date: 03/16/2016 CLINICAL DATA:  40 year old male with chest pain  EXAM: PORTABLE CHEST 1 VIEW COMPARISON:  Chest radiograph dated 12/19/2014 FINDINGS: Single portable view of chest does not demonstrate a focal consolidation. There is no pleural effusion or pneumothorax. Stable top-normal cardiac silhouette. No acute osseous pathology. IMPRESSION: No active disease. Electronically Signed   By: Elgie Collard M.D.   On: 03/16/2016 06:43   Coronary Balloon Angioplasty  Left Heart Cath and Coronary Angiography  Conclusion    Total occlusion of the apical LAD beyond the previously placed distal stent.  Patent proximal to distal LAD stents previously placed  Patent mid circumflex stent.  Total occlusion of the proximal to mid RCA. Right right and left-to-right collaterals, with faint opacification.  Successful balloon only intervention on the distal total occlusion in the LAD reducing the 100% stenosis to 60% with TIMI grade 3 flow. We chose against additional stenting because of the past recurrent stent failure in each vascular territory.  Marked  reduction in LV function with estimated ejection fraction 25% with elevated end-diastolic pressure.  Recommendations:   IV nitroglycerin 12-24 hours for blood pressure control and to improve micro-circulatory flow  Continue dual antiplatelet therapy.  Optimization of heart failure therapy  Consider AICD and eventual advanced heart failure team evaluation.  Overall prognosis is guarded. Will need case management and evaluation of home situation to verify medication use.    ASSESSMENT AND PLAN   1. Chest pain  -  Cath as above. S/p successful PTCA only to distal LAD reducing 100% stenosis to 60% with TIMI grade 3 flow. Reduced LV function to 25% on cath. Overnight chest pain, now resolved. However troponin trending up,  2.41-->10.76-->15.33. Currently chest pain free. Will trend troponin. EKG with persistent ST changes. ? Restenosis. Continue ASA, Brilinta and statin.   2. Acute on chronic systolic CHF/ Ischemic cardiomyopathy - EF was 35-40% on echo 09/17/15. Now 25% on cath 03/16/16. Likely due to medication non compliance as he hasn't took for past few weeks. He appears euvolemic. Continue ASA, Brillinta, lisinopril and statin. Will start coreg 6.25mg  BID.  MD to decided imdur. He should be on medical therapy before ICD consideration. Repeat echo in 3 months. ? LifeVest until then.   3. HTN - Elevated at time. He was on metoprolol 25mg  BID previously. Telemetry shoes rate intermittently goes to low 50s to high 120s.   4. NSVT - 2 episode overnight. 6 beats and 10 beats. Resume BB as described above. K normal. Will get Mg.   5. HLD - 09/14/2015: Cholesterol 215; HDL 30; LDL Cholesterol 139; Triglycerides 228; VLDL 46. Will get lipid panel tomorrow AM.  Continue statin.   Lorelei Pont PA-C Pager [phone]   Attending Note:   The patient was seen and examined.  Agree with assessment and plan as noted above.  Changes made to the above note as needed.  Patient seen  and independently examined with Vin bhagat, PA .   We discussed all aspects of the encounter. I agree with the assessment and plan as stated above.  1. CAD pt presented with an occlusion of his mid - distal LAD - s/p successful PCI. Troponins are elevated as expected with this presentation Had a brief run of NS VT - stable  Needs to get back on his meds. Continue coreg lipitor Lisinopril brilinta Asa NTG  He [facility] with [facility] and with Dr. Eden Emms in our office.   OK for DC    I have spent a total of 40 minutes with patient reviewing  hospital  notes , telemetry, EKGs, labs and examining patient as well as establishing an assessment and plan that was discussed with the patient. > 50% of time was spent in direct patient care.   Vesta Mixer, Montez Hageman., MD, [facility] 03/17/2016, 10:11 AM [address]  [address] Office [phone] Pager [phone]

## 2016-03-17 NOTE — Progress Notes (Signed)
CARDIAC REHAB PHASE I   PRE:  Rate/Rhythm: 78 SR  BP:  Sitting: 148/76        SaO2: 97 RA  MODE:  Ambulation: 550 ft   POST:  Rate/Rhythm: 102 ST  BP:  Sitting: 163/88         SaO2: 100 RA  Pt ambulated 550 ft on RA, independent, steady gait, tolerated well with no complaints. Completed PCI education with pt at bedside.  Reviewed risk factors, tobacco cessation (pt states he has quit smoking), PCI booklet, anti-platelet therapy, activity restrictions, ntg, exercise, heart healthy diet, carb counting, portion control, sodium restrictions, CHF booklet and zone tool, daily weights and phase 2 cardiac rehab. Pt verbalized understanding, receptive to review of education, however, pt became tearful during conversation when discussing his family history of heart disease. Emotional support given to pt. Pt agrees to phase 2 cardiac rehab referral, will send to [facility] per pt request. Pt to see case manager prior to discharge regarding medication needs. Pt to recliner after walk, call bell within reach.  [phone] Joylene Grapes, RN, BSN 03/17/2016 9:02 AM

## 2016-03-17 NOTE — Care Management Note (Signed)
Case Management Note  Patient Details  Name: Peter Russo MRN: [id number] Date of Birth: April 24, 1976  Subjective/Objective:      Patient is s/p balloon angioplasty, he lives with his mother, pta indep, he states he is established at the [facility], he will go there to get his medications at dc. He has transportation at [employer] .              Action/Plan:   Expected Discharge Date:                  Expected Discharge Plan:  Home/Self Care  In-House Referral:     Discharge planning Services  CM Consult, Medication Assistance  Post Acute Care Choice:    Choice offered to:     DME Arranged:    DME Agency:     HH Arranged:    HH Agency:     Status of Service:  Completed, signed off  If discussed at [employer] of Stay Meetings, dates discussed:    Additional Comments:  Leone Haven, RN 03/17/2016, 9:31 AM

## 2016-03-17 NOTE — Hospital Discharge Follow-Up (Signed)
Transitional Care Clinic Care Coordination Note:  Admit date:  03/16/16 Discharge date: ? 03/17/16 Discharge Disposition: Home when stable Patient contact: #[phone] (mobile) Emergency contact(s): Lashonda Huguenin (friend)-#[phone]  This Case Manager reviewed patient's EMR and determined patient would benefit from post-discharge medical management and chronic care management services through the Transitional Care Clinic. Patient has a history of ischemic cardiomyopathy, CAD, HTN, HLD, pre-diabetes. Hospitalized for chest pain. Patient has had 4 inpatient admissions in the last year. This Case Manager met with patient to discuss the services and medical management that can be provided at the Transitional Care Clinic. Patient verbalized understanding and agreed to receive post-discharge care at the Transitional Care Clinic.   Patient scheduled for Transitional Care appointment on 03/24/16 at 1100 with Dr. Amao.  Clinic information and appointment time provided to patient. Appointment information also placed on AVS.  Assessment:       Home Environment: Patient lives with his mother in a private residence.       Support System: mother, friends       Level of functioning: Independent       Home DME: none       Home care services: none       Transportation: Patient typically drives to his medical appointments. Patient denies any problems getting to his medical appointments.        Food/Nutrition: Patient indicates he has access to needed food.        Medications: Patient indicates he gets his medications from the pharmacy at [facility]. Patient indicates he sometimes has difficulty affording his medications. Thoroughly discussed the pharmacy resources available at [facility] and encouraged patient to pick up his discharge medications as possible as possible from pharmacy. Deborah Taylor, RN CM gave patient a Brilinta coupon. This Case Manager  informed patient of importance of picking up medication as soon as possible and following up with [facility] pharmacy Patient Assistance Coordinator to discuss Brilinta Patient Assistance Program.         Identified Barriers: uninsured-Patient informed he would benefit from meeting with Financial Counselor at [facility] to determine if eligible for the [facility]ount Program or Orange Card. Patient verbalized understanding. Additional barriers include: inability to afford medications-[facility] pharmacy resources thoroughly discussed and lack of PCP.        PCP: previously Valerie Keck, NP-Patient able to designate Dr. Amao or another provider at [facility] (or clinic of choice) as PCP after 30 days of medical management with the Transitional Care Clinic at [facility].              Arranged services:        Services communicated to Deborah Taylor, RN CM     

## 2016-03-18 ENCOUNTER — Telehealth: Payer: Self-pay

## 2016-03-18 NOTE — Telephone Encounter (Addendum)
[facility] Post-discharge Follow-Up Phone Call:  Date of Discharge: 03/17/16 Principal Discharge Diagnosis(es): Chest pain, acute on chronic systolic CHF, HTN, HLD, NSVT Call Completed: No                    With Whom: Patient    Please check all that apply:  X  Patient is knowledgeable of his/her condition(s) and/or treatment. X  Patient is caring for self at home.  ? Patient is receiving assist at home from family and/or caregiver. Family and/or caregiver is knowledgeable of patient's condition(s) and/or treatment. ? Patient is receiving home health services. If so, name of agency.     Medication Reconciliation:  ? Medication list reviewed with patient. X  Patient obtained all discharge medications-No. Attempted to review all of patient's discharge medications, and patient indicated he was currently in the car and would have to call this Case Manager back at a later time. Awaiting return call from patient.  This Case Manager spoke with Milderd Meager, Pharmacy Tech, at [facility], who indicated the following medications are ready to pick-up at Pharmacy: carvedilol 6.25 mg tablet, lipitor 80 mg tablet, tricor 48 mg tablet, lisinopril 5 mg tablet, nitroglycerin 0.4 mg SL tablet, zoloft 50 mg tablet, brilinta. She also indicated she will need to speak with patient about PASS program for Brilinta when patient comes to pick up medications.  Awaiting return call from patient.   Activities of Daily Living:  X  Independent ? Needs assist (describe; ? home DME used) ? Total Care (describe, ? home DME used)   Community resources in place for patient:  X  None  ? Home Health/Home DME ? Assisted Living ? Support Group         Questions/Concerns discussed: This Case Manager placed call to patient to complete post-discharge follow-up phone call. Patient indicated he was doing well and denied chest pain or shortness of breath.  Patient indicated he  was in car and would have to return call to this Case Manager at a later time.  Addendum 1440-This Case Manager placed additional call to patient to review his medication list and to inform him that his medications are ready for pick-up at [facility] pharmacy.  Call placed to #[phone]; unable to reach patient. HIPPA compliant voicemail left requesting return call.

## 2016-03-21 ENCOUNTER — Telehealth: Payer: Self-pay | Admitting: Cardiovascular Disease

## 2016-03-21 ENCOUNTER — Telehealth: Payer: Self-pay

## 2016-03-21 NOTE — Telephone Encounter (Signed)
Walk in pt form-[facility]-he is aware she is not back in office until Tuesday 03/22/16-message will be taken to her in am.

## 2016-03-21 NOTE — Telephone Encounter (Signed)
Call placed to the patient and he confirmed that he has all of his medications.  He said that he picked them up from the pharmacy. Reviewed the medication list with him and instructed him to bring all of his medications with him to his appointment on 03/24/16.  When reviewing the list of medications, reminded him that he is to stop taking the zetia, lasix, imdur and lopressor and to start taking the aspirin and carvedilol  He stated that he understood.  No other questions and no problems reported at this time. He was appreciative of the call.

## 2016-03-22 NOTE — Telephone Encounter (Signed)
Talked to patient see phone note from 03/22/16

## 2016-03-22 NOTE — Telephone Encounter (Signed)
Called patient back about his questions. Patient needs a letter stating how long he needs to be out of work. Will forward to Dr. Eden EmmsNishan for advisement.

## 2016-03-22 NOTE — Telephone Encounter (Signed)
May return to work first week in September post MI

## 2016-03-23 ENCOUNTER — Telehealth: Payer: Self-pay

## 2016-03-23 NOTE — Telephone Encounter (Signed)
Left message to call back  

## 2016-03-23 NOTE — Telephone Encounter (Signed)
Called the patient and [facility] for [facility], 03/24/16 @ 1100. He said that he has transportation to the clinic. Reminded him to bring his medications with him to the clinic and he said " okay."  No problems/questons reported at this time.

## 2016-03-23 NOTE — Telephone Encounter (Signed)
Pt called back, informed him we would place a letter at check in stating he can return to work 1st week of SEP.  Patient Calls   Ethelda ChickPamela Pate Ingalls, RN 15 minutes ago (9:45 AM)    Left message to call back.   Documentation     Ethelda ChickPamela Pate Ingalls, RN  Janeece RiggersMark E. Voorheis 15 minutes ago (9:45 AM)    Wendall StadePeter C Nishan, MD  Ethelda ChickPamela Pate Ingalls, RN 11 hours ago (10:44 PM)    May return to work first week in September post MI   Documentation

## 2016-03-24 ENCOUNTER — Inpatient Hospital Stay (HOSPITAL_COMMUNITY)
Admission: EM | Admit: 2016-03-24 | Discharge: 2016-03-26 | DRG: 281 | Disposition: A | Discharge status: Home / Self Care | Payer: Medicare Other | Attending: Cardiology | Admitting: Cardiology

## 2016-03-24 ENCOUNTER — Encounter (HOSPITAL_COMMUNITY): Payer: Self-pay | Admitting: Emergency Medicine

## 2016-03-24 ENCOUNTER — Inpatient Hospital Stay: Payer: Self-pay | Admitting: Family Medicine

## 2016-03-24 ENCOUNTER — Emergency Department (HOSPITAL_COMMUNITY): Payer: Medicare Other

## 2016-03-24 DIAGNOSIS — I255 Ischemic cardiomyopathy: Secondary | ICD-10-CM

## 2016-03-24 DIAGNOSIS — I110 Hypertensive heart disease with heart failure: Secondary | ICD-10-CM | POA: Diagnosis present

## 2016-03-24 DIAGNOSIS — R7303 Prediabetes: Secondary | ICD-10-CM | POA: Diagnosis not present

## 2016-03-24 DIAGNOSIS — I249 Acute ischemic heart disease, unspecified: Secondary | ICD-10-CM | POA: Diagnosis present

## 2016-03-24 DIAGNOSIS — Z9114 Patient's other noncompliance with medication regimen: Secondary | ICD-10-CM

## 2016-03-24 DIAGNOSIS — I5042 Chronic combined systolic (congestive) and diastolic (congestive) heart failure: Secondary | ICD-10-CM | POA: Diagnosis present

## 2016-03-24 DIAGNOSIS — Z6841 Body Mass Index (BMI) 40.0 and over, adult: Secondary | ICD-10-CM | POA: Diagnosis not present

## 2016-03-24 DIAGNOSIS — F419 Anxiety disorder, unspecified: Secondary | ICD-10-CM | POA: Diagnosis not present

## 2016-03-24 DIAGNOSIS — I214 Non-ST elevation (NSTEMI) myocardial infarction: Secondary | ICD-10-CM | POA: Diagnosis present

## 2016-03-24 DIAGNOSIS — Z9119 Patient's noncompliance with other medical treatment and regimen: Secondary | ICD-10-CM

## 2016-03-24 DIAGNOSIS — I2510 Atherosclerotic heart disease of native coronary artery without angina pectoris: Secondary | ICD-10-CM

## 2016-03-24 DIAGNOSIS — I252 Old myocardial infarction: Secondary | ICD-10-CM | POA: Diagnosis not present

## 2016-03-24 DIAGNOSIS — E785 Hyperlipidemia, unspecified: Secondary | ICD-10-CM

## 2016-03-24 DIAGNOSIS — I5043 Acute on chronic combined systolic (congestive) and diastolic (congestive) heart failure: Secondary | ICD-10-CM | POA: Diagnosis present

## 2016-03-24 DIAGNOSIS — Z79899 Other long term (current) drug therapy: Secondary | ICD-10-CM

## 2016-03-24 DIAGNOSIS — E7800 Pure hypercholesterolemia, unspecified: Secondary | ICD-10-CM | POA: Diagnosis not present

## 2016-03-24 DIAGNOSIS — R079 Chest pain, unspecified: Secondary | ICD-10-CM | POA: Diagnosis not present

## 2016-03-24 DIAGNOSIS — F17210 Nicotine dependence, cigarettes, uncomplicated: Secondary | ICD-10-CM | POA: Diagnosis present

## 2016-03-24 DIAGNOSIS — Z91148 Patient's other noncompliance with medication regimen for other reason: Secondary | ICD-10-CM

## 2016-03-24 DIAGNOSIS — Z955 Presence of coronary angioplasty implant and graft: Secondary | ICD-10-CM

## 2016-03-24 DIAGNOSIS — I119 Hypertensive heart disease without heart failure: Secondary | ICD-10-CM

## 2016-03-24 DIAGNOSIS — Z8249 Family history of ischemic heart disease and other diseases of the circulatory system: Secondary | ICD-10-CM

## 2016-03-24 DIAGNOSIS — Z9861 Coronary angioplasty status: Secondary | ICD-10-CM

## 2016-03-24 DIAGNOSIS — I10 Essential (primary) hypertension: Secondary | ICD-10-CM | POA: Diagnosis present

## 2016-03-24 HISTORY — DX: Anxiety disorder, unspecified: F41.9

## 2016-03-24 HISTORY — DX: Chronic combined systolic (congestive) and diastolic (congestive) heart failure: I50.42

## 2016-03-24 LAB — I-STAT TROPONIN, ED: Troponin i, poc: 0.04 ng/mL (ref 0.00–0.08)

## 2016-03-24 LAB — BASIC METABOLIC PANEL
Anion gap: 10 (ref 5–15)
BUN: 11 mg/dL (ref 6–20)
CO2: 26 mmol/L (ref 22–32)
Calcium: 9.5 mg/dL (ref 8.9–10.3)
Chloride: 102 mmol/L (ref 101–111)
Creatinine, Ser: 1.18 mg/dL (ref 0.61–1.24)
GFR calc Af Amer: >60 mL/min (ref >60)
GFR calc non Af Amer: >60 mL/min (ref >60)
Glucose, Bld: 164 mg/dL — ABNORMAL HIGH (ref 65–99)
Potassium: 4.1 mmol/L (ref 3.5–5.1)
Sodium: 138 mmol/L (ref 135–145)

## 2016-03-24 LAB — CREATININE, SERUM
Creatinine, Ser: 1.22 mg/dL (ref 0.61–1.24)
GFR calc Af Amer: >60 mL/min (ref >60)
GFR calc non Af Amer: >60 mL/min (ref >60)

## 2016-03-24 LAB — TROPONIN I
Troponin I: 2.85 ng/mL (ref <0.03)
Troponin I: 3.38 ng/mL (ref <0.03)

## 2016-03-24 LAB — CBC
HCT: 43.9 % (ref 39.0–52.0)
HCT: 43.1 % (ref 39.0–52.0)
Hemoglobin: 15 g/dL (ref 13.0–17.0)
Hemoglobin: 14.9 g/dL (ref 13.0–17.0)
MCH: 29.8 pg (ref 26.0–34.0)
MCH: 29.7 pg (ref 26.0–34.0)
MCHC: 34.2 g/dL (ref 30.0–36.0)
MCHC: 34.6 g/dL (ref 30.0–36.0)
MCV: 87.1 fL (ref 78.0–100.0)
MCV: 86 fL (ref 78.0–100.0)
Platelets: 245 10*3/uL (ref 150–400)
Platelets: 285 10*3/uL (ref 150–400)
RBC: 5.04 MIL/uL (ref 4.22–5.81)
RBC: 5.01 MIL/uL (ref 4.22–5.81)
RDW: 13.3 % (ref 11.5–15.5)
RDW: 13.3 % (ref 11.5–15.5)
WBC: 9.2 10*3/uL (ref 4.0–10.5)
WBC: 11.6 10*3/uL — ABNORMAL HIGH (ref 4.0–10.5)

## 2016-03-24 MED ORDER — NITROGLYCERIN IN D5W 200-5 MCG/ML-% IV SOLN
0.0000 ug/min | INTRAVENOUS | Status: DC
  Administered 2016-03-24: 10 ug/min via INTRAVENOUS
  Filled 2016-03-24: qty 250

## 2016-03-24 MED ORDER — ISOSORBIDE MONONITRATE ER 30 MG PO TB24: 30.0000 mg | ORAL_TABLET | Freq: Every day | ORAL | Status: DC

## 2016-03-24 MED ORDER — NITROGLYCERIN 0.4 MG SL SUBL: 0.4000 mg | SUBLINGUAL_TABLET | SUBLINGUAL | Status: DC | PRN

## 2016-03-24 MED ORDER — ISOSORBIDE MONONITRATE ER 30 MG PO TB24
30.0000 mg | ORAL_TABLET | Freq: Every day | ORAL | Status: DC
  Administered 2016-03-24: 30 mg via ORAL
  Filled 2016-03-24: qty 1

## 2016-03-24 MED ORDER — MORPHINE SULFATE (PF) 2 MG/ML IV SOLN
2.0000 mg | INTRAVENOUS | Status: DC | PRN
  Administered 2016-03-24: 2 mg via INTRAVENOUS
  Filled 2016-03-24: qty 1

## 2016-03-24 MED ORDER — FENOFIBRATE 160 MG PO TABS
160.0000 mg | ORAL_TABLET | Freq: Every day | ORAL | Status: DC
  Administered 2016-03-25 – 2016-03-26 (×2): 160 mg via ORAL
  Filled 2016-03-24 (×2): qty 1

## 2016-03-24 MED ORDER — SERTRALINE HCL 50 MG PO TABS
50.0000 mg | ORAL_TABLET | Freq: Every day | ORAL | Status: DC
  Administered 2016-03-25: 50 mg via ORAL
  Filled 2016-03-24: qty 1

## 2016-03-24 MED ORDER — HEPARIN BOLUS VIA INFUSION
4000.0000 [IU] | Freq: Once | INTRAVENOUS | Status: AC
Start: 2016-03-24 — End: 2016-03-24
  Administered 2016-03-24: 4000 [IU] via INTRAVENOUS
  Filled 2016-03-24: qty 4000

## 2016-03-24 MED ORDER — ALPRAZOLAM 0.5 MG PO TABS: 0.5000 mg | ORAL_TABLET | Freq: Two times a day (BID) | ORAL | Status: DC | PRN

## 2016-03-24 MED ORDER — LISINOPRIL 5 MG PO TABS
5.0000 mg | ORAL_TABLET | Freq: Every day | ORAL | Status: DC
  Administered 2016-03-25 – 2016-03-26 (×2): 5 mg via ORAL
  Filled 2016-03-24 (×2): qty 1

## 2016-03-24 MED ORDER — HEPARIN SODIUM (PORCINE) 5000 UNIT/ML IJ SOLN: 5000.0000 [IU] | Freq: Three times a day (TID) | INTRAMUSCULAR | Status: DC

## 2016-03-24 MED ORDER — ATORVASTATIN CALCIUM 80 MG PO TABS
80.0000 mg | ORAL_TABLET | Freq: Every day | ORAL | Status: DC
  Administered 2016-03-24 – 2016-03-25 (×2): 80 mg via ORAL
  Filled 2016-03-24: qty 1
  Filled 2016-03-24: qty 2

## 2016-03-24 MED ORDER — ASPIRIN EC 325 MG PO TBEC
325.0000 mg | DELAYED_RELEASE_TABLET | Freq: Once | ORAL | Status: AC
  Administered 2016-03-24: 325 mg via ORAL
  Filled 2016-03-24: qty 1

## 2016-03-24 MED ORDER — NITROGLYCERIN 2 % TD OINT
1.0000 [in_us] | TOPICAL_OINTMENT | Freq: Once | TRANSDERMAL | Status: AC
  Administered 2016-03-24: 1 [in_us] via TOPICAL
  Filled 2016-03-24: qty 1

## 2016-03-24 MED ORDER — TICAGRELOR 90 MG PO TABS
90.0000 mg | ORAL_TABLET | Freq: Two times a day (BID) | ORAL | Status: DC
  Administered 2016-03-24 – 2016-03-26 (×4): 90 mg via ORAL
  Filled 2016-03-24 (×4): qty 1

## 2016-03-24 MED ORDER — ONDANSETRON HCL 4 MG/2ML IJ SOLN: 4.0000 mg | Freq: Four times a day (QID) | INTRAMUSCULAR | Status: DC | PRN

## 2016-03-24 MED ORDER — NITROGLYCERIN 0.4 MG SL SUBL
0.4000 mg | SUBLINGUAL_TABLET | SUBLINGUAL | Status: DC | PRN
  Administered 2016-03-24 (×2): 0.4 mg via SUBLINGUAL
  Filled 2016-03-24: qty 1

## 2016-03-24 MED ORDER — HEPARIN (PORCINE) IN NACL 100-0.45 UNIT/ML-% IJ SOLN
1700.0000 [IU]/h | INTRAMUSCULAR | Status: DC
  Administered 2016-03-24: 1300 [IU]/h via INTRAVENOUS
  Filled 2016-03-24: qty 250

## 2016-03-24 MED ORDER — ASPIRIN 81 MG PO TBEC: 81.0000 mg | DELAYED_RELEASE_TABLET | Freq: Every day | ORAL | Status: DC

## 2016-03-24 MED ORDER — SPIRONOLACTONE 25 MG PO TABS
25.0000 mg | ORAL_TABLET | Freq: Every day | ORAL | Status: DC
  Administered 2016-03-24 – 2016-03-26 (×2): 25 mg via ORAL
  Filled 2016-03-24 (×2): qty 1

## 2016-03-24 MED ORDER — ASPIRIN EC 81 MG PO TBEC
81.0000 mg | DELAYED_RELEASE_TABLET | Freq: Every day | ORAL | Status: DC
  Administered 2016-03-25 – 2016-03-26 (×2): 81 mg via ORAL
  Filled 2016-03-24 (×2): qty 1

## 2016-03-24 MED ORDER — ACETAMINOPHEN 325 MG PO TABS: 650.0000 mg | ORAL_TABLET | ORAL | Status: DC | PRN

## 2016-03-24 MED ORDER — CARVEDILOL 6.25 MG PO TABS
6.2500 mg | ORAL_TABLET | Freq: Two times a day (BID) | ORAL | Status: DC
  Administered 2016-03-24 – 2016-03-26 (×4): 6.25 mg via ORAL
  Filled 2016-03-24 (×4): qty 1

## 2016-03-24 MED ORDER — SPIRONOLACTONE 25 MG PO TABS: 25.0000 mg | ORAL_TABLET | Freq: Every day | ORAL | Status: DC

## 2016-03-24 NOTE — H&P (Signed)
Cardiology Consult    Patient ID: JAMS TRICKETT MRN: [id number], DOB/AGE: [birth year]   Admit date: 03/24/2016 Date of Consult: 03/24/2016  Primary Physician: Ambrose Finland, NP Reason for Consult: Chest pain  Primary Cardiologist: Dr. Eden Emms Requesting Provider: Dr. Jacqulyn Bath  Patient Profile  Mr. Ensminger is a 40 year old male with a past medical history of CAD, ischemic cardiomyopathy (EF 35-40%-->25% on cath 03/16/16), HTN, HLD and pre-diabetes, obesity and GERDpresenting to the ED with chest pain.   History of Present Illness  He was recently admitted from 03/16/16-03/17/16 with unstable angina that awakened him from sleep. Due to symptoms concerning for unstable angina, the patient was taken for cardiac catheterization on 03/16/16 that showed patent proximal to distal LAD stent, total occulusion of apical LAD beyond previously placed stent. Patent mid circumflex stent. Total occlusion of the proximal to mid RCA with right to right and left-to-right collaterals, with faint opacification. He had successful balloon angioplasty only on the distal total occlusion in the LAD reducing the 100% stenosis to 60% with TIMI grade 3 flow. No stenting done because of the past recurrent stent failure in each vascular territory  He was also admitted on 09/14/15 with a STEMI (after stopping his anti-platelet medication due to financial reasons) due to occlusion of the mid LAD within a previously placed BMS. He underwent successful PTCA and restenting of the mid LAD.   His EF by Echo on 09/17/15 was 35-40%, however his EF by cath on 03/16/16 was 25%. He admitted during last admission to medication noncompliance. He has been considered for an ICD in the past, but did not get MRI due to insurance reasons.   Today, he got up and showered and began having a "funny feeling" in his chest. He describes the feeling as a burning sensation similar to what he felt last week but not as severe. He went outside and walked his  dog and the feeling in his chest became more like chest pain and pressure. He went inside and took one sublingual nitro, his pain was not relieved so he drove himself to the ED. Denies associated symptoms of SOB, nausea and diaphoresis.   He has been chest pain free since arrival to the ED. EKG shows NSR with some nonspecific ST changes in anterolateral leads. Troponin is 0.04.   He tells me that he has been taking all of his medications as prescribed. He has taken his Brilinta and has not missed any doses. Review of chart shows that he did pick of his medications.   He has stopped smoking, and denies ETOH use.   Past Medical History   Past Medical History:  Diagnosis Date  . CAD S/P percutaneous coronary angioplasty    a. s/p BMS-mLAD 2010. b. DES to RCA 2012. c. 04/2015: DES to LCx and PCI to LAD c/b dissection with subsequent overlapping DES to mLAD - r/i for NSTEMI afterwards.  . CHF (congestive heart failure) (HCC)   . Depression   . Essential hypertension   . GAD (generalized anxiety disorder)   . GERD (gastroesophageal reflux disease)   . Hypercholesteremia   . Ischemic cardiomyopathy 09/14/2015   EF 25-35 percent at cath, 35-40 percent by echo  . MI (myocardial infarction) (HCC) 2010; 2013  . Morbid obesity (HCC)   . NSTEMI (non-ST elevated myocardial infarction) (HCC)   . Pre-diabetes   . ST elevation (STEMI) myocardial infarction involving left anterior descending coronary artery (HCC) 09/14/2015   3.0 x 16 mm  Synergy DES to the LAD for ISR BMS  . SVT (supraventricular tachycardia) (HCC) APRROX 10 YRS AGO   PRESUMED AVNRT, ECHO 12/07 WITH EF 65%, MILD LAE  . Tobacco abuse     Past Surgical History:  Procedure Laterality Date  . CARDIAC CATHETERIZATION N/A 04/23/2015   Procedure: Left Heart Cath and Coronary Angiography;  Surgeon: Wendall StadePeter C Nishan, MD;  [facility];  Service: Cardiovascular;  Laterality: N/A;  . CARDIAC CATHETERIZATION N/A 04/23/2015    Procedure: Coronary Stent Intervention;  Surgeon: Tonny BollmanMichael Cooper, MD;  [facility];  Service: Cardiovascular;  Laterality: N/A;  . CARDIAC CATHETERIZATION N/A 09/14/2015   Procedure: Left Heart Cath and Coronary Angiography;  Surgeon: Lyn RecordsHenry W Smith, MD;  LAD 100%, CFX 10% ISR, RCA 100% (chronic), EF 25-35%  . CARDIAC CATHETERIZATION N/A 09/14/2015   Procedure: Coronary Stent Intervention;  Surgeon: Lyn RecordsHenry W Smith, MD;  [facility];  Service: Cardiovascular;  Laterality: N/A; 3.0 x 16 mm Synergy DES LAD  . CARDIAC CATHETERIZATION  03/16/2016  . CARDIAC CATHETERIZATION N/A 03/16/2016   Procedure: Left Heart Cath and Coronary Angiography;  Surgeon: Lyn RecordsHenry W Smith, MD;  [facility];  Service: Cardiovascular;  Laterality: N/A;  . CARDIAC CATHETERIZATION N/A 03/16/2016   Procedure: Coronary Balloon Angioplasty;  Surgeon: Lyn RecordsHenry W Smith, MD;  [facility];  Service: Cardiovascular;  Laterality: N/A;  . CORONARY ANGIOPLASTY WITH STENT PLACEMENT  2010; 2013     Allergies  No Known Allergies  Inpatient Medications      Family History    Family History  Problem Relation Age of Onset  . Arrhythmia Mother     MOTHER LIVED TO BE 89  . Coronary artery disease Mother   . Heart attack Mother   . Hypertension Mother   . Coronary artery disease Father [id number]  . Heart disease Father   . Heart attack Father   . Heart attack Brother   . Stroke Neg Hx     Social History    Social History   Social History  . Marital status: Single    Spouse name: N/A  . Number of children: N/A  . 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 Smoking status: Current Every Day Smoker    Packs/day: 1.00    Years: 24.00    Types: Cigarettes  . Smokeless tobacco: Never Used  . Alcohol use No  . Drug use:     Types: Marijuana     Comment: 03/16/2016 "1-2 times/week, if that"  . Sexual  activity: Yes   Other Topics Concern  . Not on file   Social History Narrative   REGULAR EXERCISE     Review of Systems    General:  No chills, fever, night sweats or weight changes.  Cardiovascular:  No chest pain, dyspnea on exertion, edema, orthopnea, palpitations, paroxysmal nocturnal dyspnea. Dermatological: No rash, lesions/masses Respiratory: No cough, dyspnea Urologic: No hematuria, dysuria Abdominal:   No nausea, vomiting, diarrhea, bright red blood per rectum, melena, or hematemesis Neurologic:  No visual changes, wkns, changes in mental status. All other systems reviewed and are otherwise negative except as noted above.  Physical Exam    Blood pressure 119/69, pulse (!) 59, temperature 98.2 F (36.8 C), temperature source Oral, resp. rate 15, height 5\' 7"  (1.702 m), weight 263 lb (119.3 kg), SpO2 98 %.  General:  Pleasant, NAD Psych: Normal affect. Neuro: Alert and oriented X 3. Moves all extremities spontaneously. HEENT: Normal  Neck: Supple without bruits or JVD. Lungs:  Resp regular and unlabored, CTA. Heart: RRR no s3, s4, or murmurs. Abdomen: Soft, non-tender, non-distended, BS + x 4.  Extremities: No clubbing, cyanosis or edema. DP/PT/Radials 2+ and equal bilaterally.  Labs    Troponin [facility] of Care Test)  Recent Labs  03/24/16 0905  TROPIPOC 0.04   Lab Results  Component Value Date   WBC 9.2 03/24/2016   HGB 15.0 03/24/2016   HCT 43.9 03/24/2016   MCV 87.1 03/24/2016   PLT 245 03/24/2016    Recent Labs Lab 03/24/16 0857  NA 138  K 4.1  CL 102  CO2 26  BUN 11  CREATININE 1.18  CALCIUM 9.5  GLUCOSE 164*    Radiology Studies    Dg Chest 2 View  Result Date: 03/24/2016 CLINICAL DATA:  Chest pain EXAM: CHEST  2 VIEW COMPARISON:  03/16/2016 FINDINGS: Cardiac shadow is within normal limits. Coronary stenting is again identified. The lungs are well aerated bilaterally. Minimal platelike atelectasis in the left mid lung is noted. No sizable  effusion is seen. No bony abnormality is noted. IMPRESSION: Minimal platelike atelectasis in the left mid lung. Electronically Signed   By: Alcide Clever M.D.   On: 03/24/2016 09:06   Dg Chest Port 1 View  Result Date: 03/16/2016 CLINICAL DATA:  40 year old male with chest pain EXAM: PORTABLE CHEST 1 VIEW COMPARISON:  Chest radiograph dated 12/19/2014 FINDINGS: Single portable view of chest does not demonstrate a focal consolidation. There is no pleural effusion or pneumothorax. Stable top-normal cardiac silhouette. No acute osseous pathology. IMPRESSION: No active disease. Electronically Signed   By: Elgie Collard M.D.   On: 03/16/2016 06:43    EKG & Cardiac Imaging    EKG: NSR, non specific ST changes in anterolateral leads.   Echocardiogram: 09/17/15 Study Conclusions  - Left ventricle: The cavity size was mildly dilated. Wall   thickness was normal. Systolic function was moderately reduced.   The estimated ejection fraction was in the range of 35% to 40%.   There is akinesis of the apical myocardium. Features are   consistent with a pseudonormal left ventricular filling pattern,   with concomitant abnormal relaxation and increased filling   pressure (grade 2 diastolic dysfunction).  Impressions:  - Technically difficult; definity used; apical akinesis with   overall moderately reduced LV function (EF 40); grade 2 diastolic   dysfunction; trace MR and TR.  Coronary Balloon Angioplasty  Left Heart Cath and Coronary Angiography 03/16/16   Total occlusion of the apical LAD beyond the previously placed distal stent.  Patent proximal to distal LAD stents previously placed  Patent mid circumflex stent.  Total occlusion of the proximal to mid RCA. Right right and left-to-right collaterals, with faint opacification.  Successful balloon only intervention on the distal total occlusion in the LAD reducing the 100% stenosis to 60% with TIMI grade 3 flow. We chose against additional  stenting because of the past recurrent stent failure in each vascular territory.  Marked reduction in LV function with estimated ejection fraction 25% with elevated end-diastolic pressure.  Recommendations:   IV nitroglycerin 12-24 hours for blood pressure control and to improve micro-circulatory flow  Continue dual antiplatelet therapy.  Optimization of heart failure therapy  Consider AICD and eventual advanced heart failure team evaluation.  Overall prognosis is guarded. Will need case management and evaluation of home situation  to verify medication use   Assessment & Plan  1. Angina with history of CAD: Presents with angina in the setting of known CAD. He had a very recent cath just 8 days ago at which time he had balloon only intervention to his distal LAD which had total occlusion, reducing the occlusion to 60% but with TIMI grade 3 flow. He has known total occlusion of proximal to mid RCA. There are left to right collaterals there. On ASA and Brilinta, will continue.   Would recommend adding isosorbide to his anti-anginal regimen. He is already on Coreg. I suspect that he would be a good candidate for Ranexa as well, but cost is an issue.   2. Ischemic cardiomyopathy: EF 25% by cath last week and elevated LVEDP. He does not appear volume overloaded on exam. He is on Coreg and ACE-I. I would consider adding spironolactone to his regimen with low EF for mortality benefit. Creatinine in within normal range.   3. HTN: Well controlled on current regimen.   4. HLD: Continue high intensity statin. LDL is 184.   5. Pre-diabetes: A1c was 6.2 6 months ago, would repeat to risk stratify.   Signed, Little Ishikawa, NP 03/24/2016, 11:03 AM Pager: [phone]  The patient was seen, examined and discussed with Little Ishikawa, NP and I agree with the above.   This is a 40 year old male well known to our service with known CAD prior history of noncompliance and history of ischemic  cardiomyopathy LVEF 35-40% in January 2017, when he was admitted with a STEMI when he stopped his antiplatelet medicine and was found to have occlusion of mid LAD within the previously placed bare-metal stent and he underwent successful PTCA restenting of mid LAD.Marland Kitchen  He was just discharged after hospitalization on 03/16/2016 for unstable angina when he underwent left cardiac catheterization that showed patent proximal to distal LAD stent, total occulusion of apical LAD beyond previously placed stent. Patent mid circumflex stent. Total occlusion of the proximal to mid RCA with right to right and left-to-right collaterals, with faint opacification. He had successful balloon angioplasty only on the distal total occlusion in the LAD reducing the 100% stenosis to 60% with TIMI grade 3 flow. No stenting done because of the past recurrent stent failure in each vascular territory.  He presented today with retrosternal chest pain that didn't respond to nitroglycerin, his pain is currently still persistent about 2 out of 10, he states he was compliant with medicines, his EKG shows NSR with anterior infarct, nonspecific ST-T wave abnormalities unchanged from the prior EKGs,  with some nonspecific ST changes in anterolateral leads. Troponin is negative 1.  We'll admit for observation overnight continue repeating these troponins. His blood pressure was elevated we will place Imdur 30 mg daily Inspra no laxity 25 mg daily. We will also repeat echocardiogram remains less than 35% moods referred to EP for consideration of ICD placement.  Tobias Alexander 03/24/2016

## 2016-03-24 NOTE — Progress Notes (Signed)
Cards fellow called and was notified of labs, VS and patient's condition> patient has received 2 out of his 3 NTG and B/P remains stable but pain is still at 5/10, will get another IV site since he will be starting on NTG infusion and a Heparin drip, will give Morphine 2 mg very slowly IVP and wait for Cardiology to see and look at his EKG but it doesn't look any different from previous, will continue to monitor.

## 2016-03-24 NOTE — Care Management (Signed)
[facility] appointment for today cancelled.

## 2016-03-24 NOTE — ED Provider Notes (Signed)
Emergency Department Provider Note   I have reviewed the triage vital signs and the nursing notes.   HISTORY  Chief Complaint Chest Pain   HPI Peter Russo is a 40 y.o. male with PMH of CHF, HTN, GERD, prior MI, and recent heart cath with PCI earlier this month. The patient states that his chest pain began suddenly this morning while getting ready for the day. He reports central chest pressure with radiation to the jaw. He states it feels similar to prior episodes of cardiac pain. He reports recent heart catheterization. CT is been compliant with his home medications since discharge. No associated productive cough or fever. No headaches. No back pain. He took a nitroglycerin which did not seem to improve his symptoms. He states since arrival in the emergency department his pain has decreased somewhat.    Past Medical History:  Diagnosis Date  . CAD S/P percutaneous coronary angioplasty    a. s/p BMS-mLAD 2010. b. DES to RCA 2012. c. 04/2015: DES to LCx and PCI to LAD c/b dissection with subsequent overlapping DES to mLAD - r/i for NSTEMI afterwards.  . CHF (congestive heart failure) (HCC)   . Depression   . Essential hypertension   . GAD (generalized anxiety disorder)   . GERD (gastroesophageal reflux disease)   . Hypercholesteremia   . Ischemic cardiomyopathy 09/14/2015   EF 25-35 percent at cath, 35-40 percent by echo  . MI (myocardial infarction) (HCC) 2010; 2013  . Morbid obesity (HCC)   . NSTEMI (non-ST elevated myocardial infarction) (HCC)   . Pre-diabetes   . ST elevation (STEMI) myocardial infarction involving left anterior descending coronary artery (HCC) 09/14/2015   3.0 x 16 mm Synergy DES to the LAD for ISR BMS  . SVT (supraventricular tachycardia) (HCC) APRROX 10 YRS AGO   PRESUMED AVNRT, ECHO 12/07 WITH EF 65%, MILD LAE  . Tobacco abuse     Patient Active Problem List   Diagnosis Date Noted  . Chest pain with moderate risk for cardiac etiology 03/16/2016   . ACS (acute coronary syndrome) (HCC) 03/16/2016  . Chest pain 03/16/2016  . Coronary artery disease involving native coronary artery of native heart with angina pectoris with documented spasm (HCC)   . Cardiomyopathy, ischemic 09/22/2015  . ST elevation (STEMI) myocardial infarction involving left anterior descending coronary artery (HCC) 09/14/2015  . Ischemic chest pain (HCC)   . Morbid obesity (HCC)   . Pre-diabetes   . Tobacco abuse   . CAD S/P percutaneous coronary angioplasty   . Essential hypertension   . Cellulitis of hand 05/27/2015  . 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 05/26/2015  . NSTEMI (non-ST elevated myocardial infarction) (HCC) 04/24/2015  . Depression 12/26/2014  . GAD (generalized anxiety disorder) 12/26/2014  . GERD (gastroesophageal reflux disease) 03/01/2013  . Mixed hyperlipidemia 03/14/2011    Past Surgical History:  Procedure Laterality Date  . CARDIAC CATHETERIZATION N/A 04/23/2015   Procedure: Left Heart Cath and Coronary Angiography;  Surgeon: Wendall Stade, MD;  Location: [facility] INVASIVE CV LAB;  Service: Cardiovascular;  Laterality: N/A;  . CARDIAC CATHETERIZATION N/A 04/23/2015   Procedure: Coronary Stent Intervention;  Surgeon: Tonny Bollman, MD;  Location: [facility] INVASIVE CV LAB;  Service: Cardiovascular;  Laterality: N/A;  . CARDIAC CATHETERIZATION N/A 09/14/2015   Procedure: Left Heart Cath and Coronary Angiography;  Surgeon: Lyn Records, MD;  LAD 100%, CFX 10% ISR, RCA 100% (chronic), EF 25-35%  . CARDIAC CATHETERIZATION N/A 09/14/2015   Procedure:  Coronary Stent Intervention;  Surgeon: Lyn RecordsHenry W Smith, MD;  [facility];  Service: Cardiovascular;  Laterality: N/A; 3.0 x 16 mm Synergy DES LAD  . CARDIAC CATHETERIZATION  03/16/2016  . CARDIAC CATHETERIZATION N/A 03/16/2016   Procedure: Left Heart Cath and Coronary Angiography;  Surgeon: Lyn RecordsHenry W Smith, MD;  [facility];  Service: Cardiovascular;  Laterality: N/A;  .  CARDIAC CATHETERIZATION N/A 03/16/2016   Procedure: Coronary Balloon Angioplasty;  Surgeon: Lyn RecordsHenry W Smith, MD;  [facility];  Service: Cardiovascular;  Laterality: N/A;  . CORONARY ANGIOPLASTY WITH STENT PLACEMENT  2010; 2013    Current Outpatient Rx  . Order #: [id number] Class: Print  . Order #: [id number] Class: Normal  . Order #: [id number] Class: Normal  . Order #: [id number] Class: Normal  . Order #: [id number] Class: Normal  . Order #: [id number] Class: Normal  . Order #: [id number] Class: Historical Med  . Order #: [id number] Class: Normal  . Order #: [id number] Class: Normal    Allergies Review of patient's allergies indicates no known allergies.  Family History  Problem Relation Age of Onset  . Arrhythmia Mother     MOTHER LIVED TO BE 89  . Coronary artery disease Mother   . Heart attack Mother   . Hypertension Mother   . Coronary artery disease Father 2154  . Heart disease Father   . Heart attack Father   . Heart attack Brother   . Stroke Neg Hx     Social History Social History  Substance Use Topics  . Smoking status: Current Every Day Smoker    Packs/day: 1.00    Years: 24.00    Types: Cigarettes  . Smokeless tobacco: Never Used  . Alcohol use No    Review of Systems  Constitutional: No fever/chills Eyes: No visual changes. ENT: No sore throat. Cardiovascular: Positive chest pain. Respiratory: Positive shortness of breath. Gastrointestinal: No abdominal pain.  No nausea, no vomiting.  No diarrhea.  No constipation. Genitourinary: Negative for dysuria. Musculoskeletal: Negative for back pain. Skin: Negative for rash. Neurological: Negative for headaches, focal weakness or numbness.  10-point ROS otherwise negative.  ____________________________________________   PHYSICAL EXAM:  VITAL SIGNS: ED Triage Vitals  Enc Vitals Group     BP 03/24/16 0846 (!) 164/101     Pulse Rate 03/24/16 0846 66     Resp 03/24/16 0846 19     Temp 03/24/16 0846  98.2 F (36.8 C)     Temp Source 03/24/16 0846 Oral     SpO2 03/24/16 0846 100 %     Weight 03/24/16 0846 263 lb (119.3 kg)     Height 03/24/16 0846 5\' 7"  (1.702 m)     Pain Score 03/24/16 0843 10   Constitutional: Alert and oriented. Well appearing and in no acute distress. Eyes: Conjunctivae are normal. PERRL. EOMI. Head: Atraumatic. Nose: No congestion/rhinnorhea. Mouth/Throat: Mucous membranes are moist.  Oropharynx non-erythematous. Neck: No stridor.   Cardiovascular: Normal rate, regular rhythm. Good peripheral circulation. Grossly normal heart sounds.   Respiratory: Normal respiratory effort.  No retractions. Lungs CTAB. Gastrointestinal: Soft and nontender. No distention.  Musculoskeletal: No lower extremity tenderness nor edema. No gross deformities of extremities. Neurologic:  Normal speech and language. No gross focal neurologic deficits are appreciated.  Skin:  Skin is warm, dry and intact. No rash noted. Psychiatric: Mood and affect are normal. Speech and behavior are normal. ____________________________________________   LABS (all labs ordered are listed, but only abnormal results are displayed)  Labs  Reviewed  BASIC METABOLIC PANEL - Abnormal; Notable for the following:       Result Value   Glucose, Bld 164 (*)    All other components within normal limits  CBC  I-STAT TROPOININ, ED   ____________________________________________  EKG  Reviewed in MUSE. No STEMI.  ____________________________________________  RADIOLOGY  Dg Chest 2 View  Result Date: 03/24/2016 CLINICAL DATA:  Chest pain EXAM: CHEST  2 VIEW COMPARISON:  03/16/2016 FINDINGS: Cardiac shadow is within normal limits. Coronary stenting is again identified. The lungs are well aerated bilaterally. Minimal platelike atelectasis in the left mid lung is noted. No sizable effusion is seen. No bony abnormality is noted. IMPRESSION: Minimal platelike atelectasis in the left mid lung. Electronically Signed    By: Alcide Clever M.D.   On: 03/24/2016 09:06    ____________________________________________   PROCEDURES  Procedure(s) performed:   Procedures  None ____________________________________________   INITIAL IMPRESSION / ASSESSMENT AND PLAN / ED COURSE  Pertinent labs & imaging results that were available during my care of the patient were reviewed by me and considered in my medical decision making (see chart for details).  She presents to the emergency department for evaluation of chest pain radiating to his jaw that feels similar to prior angina. Patient had heart catheterization earlier this month with severe stenosis that was stented to approximately 60% patency. Reports being compliant with home medications. Pain seems to be resolving. Will give full dose aspirin and nitroglycerin ointment. I consulted cardiology who will be down for ED evaluation.   Cardiology has place sign and held orders. Patient pain improved.  ____________________________________________  FINAL CLINICAL IMPRESSION(S) / ED DIAGNOSES  Final diagnoses:  Nonspecific chest pain     MEDICATIONS GIVEN DURING THIS VISIT:  Medications  isosorbide mononitrate (IMDUR) 24 hr tablet 30 mg (not administered)  spironolactone (ALDACTONE) tablet 25 mg (not administered)  nitroGLYCERIN (NITROGLYN) 2 % ointment 1 inch (1 inch Topical Given 03/24/16 0926)  aspirin EC tablet 325 mg (325 mg Oral Given 03/24/16 0926)     NEW OUTPATIENT MEDICATIONS STARTED DURING THIS VISIT:  None   Note:  This document was prepared using Dragon voice recognition software and may include unintentional dictation errors.  Alona Bene, MD Emergency Medicine   Maia Plan, MD 03/24/16 [phone]

## 2016-03-24 NOTE — Plan of Care (Signed)
Problem: Safety: Goal: Ability to remain free from injury will improve Outcome: Progressing Patient given [facility] rules on safety and reviewed the white board and how to call RN/NT on his white phone, how to use the call light and not to get OOB without assistance, patient verbalized understanding and all of his belongings are on his bedside stand within his reach, will continue to monitor.

## 2016-03-24 NOTE — Care Management Note (Signed)
Case Management Note  Patient Details  Name: Peter Russo MRN: [id number] Date of Birth: 28-Jun-1976  Subjective/Objective:                   40 year old male with a past medical history of CAD, ischemic cardiomyopathy (EF 35-40%-->25% on cath 03/16/16), HTN, HLD and pre-diabetes, obesity and GERDpresenting to the ED with chest pain. /From home with parent.  Action/Plan: Follow for disposition needs.   Expected Discharge Date:  03/26/16               Expected Discharge Plan:     In-House Referral:     Discharge planning Services     Post Acute Care Choice:    Choice offered to:     DME Arranged:    DME Agency:     HH Arranged:    HH Agency:     Status of Service:     If discussed at [employer] Meetings, dates discussed:    Additional Comments:  Oletta CohnWood, Anu Stagner, RN 03/24/2016, 11:43 AM

## 2016-03-24 NOTE — Plan of Care (Signed)
Problem: Consults Goal: Chest Pain Patient Education (See Patient Education module for education specifics.)  Outcome: Progressing Patient was given written information regarding Heart attacks and Open Heart surgery as a possibility this admission and verbally reviewed what was going on and his different options, patient instructed on how to watch the heart videos and encouraged to do that when his family comes in, will continue to monitor.

## 2016-03-24 NOTE — Progress Notes (Signed)
Report received via Adelina MingsKelsey RN in patient's room, reviewed orders, labs, VS, meds, tests and patient's general condition, assumed care of patient.

## 2016-03-24 NOTE — Progress Notes (Signed)
Nitroglycerin drip started at 10 mcgs/min= 3cc/hr and will continue to monitor chest pain, rated now at a 3/10 amd patient is beginning to look a little better. Dr Trudee KusterFurmin at the bedside and speaking with the patient regarding his condition and POC. Patient started to get very anxious and tearful, talked with him for quite a while and educated on heart attacks, chest pain, blockages and the possibility of him needing CABG as MD had already spoke briefly about, will give him some written information and the number to call to watch the videos on TV regarding his condition. Patient called his family and they will be in shortly, wll continue to monitor. Patient's NTG drip at 15 mcgs/min= 4.5cc/hr and he has received his bolus dose of 4,000 units and started his Heparin drip right after as per orders, will continue to monitor and titrate as per patient's condition and orders received.

## 2016-03-24 NOTE — Progress Notes (Signed)
     Patient's troponin I 2.85. Difficult to tell if trending down from previous NSTEMI last week (peak trop 15.33). POC trop 0.04 today. Patient with ongoing chest pain. Will start heparin and nitro gtt and give IV morphine PRN. Discussed with Fellow, Dr. Fudim, who will go check on patient.    Zia Najera PA-C  [facility]   

## 2016-03-24 NOTE — Progress Notes (Signed)
Lab called with critical Troponin elevation, went in to assess patient, VSS but patient's chest pain was starting to intensify rating it at a 6/10 more pressure with no radiation or symptoms, will text page Cardiology and get an EKG, place patient on O2 at 2 LPM for comfort measures and give him NTG S/L and continue to monitor.

## 2016-03-24 NOTE — Progress Notes (Signed)
ANTICOAGULATION CONSULT NOTE - Initial Consult  Pharmacy Consult for heparin Indication: chest pain/ACS  No Known Allergies  Patient Measurements: Height: 5\' 7"  (170.2 cm) Weight: 260 lb 11.2 oz (118.3 kg) IBW/kg (Calculated) : 66.1 Heparin Dosing Weight: 95  Vital Signs: Temp: 98.2 F (36.8 C) (08/10 1700) Temp Source: Oral (08/10 1700) BP: 104/74 (08/10 1700) Pulse Rate: 59 (08/10 1700)  Labs:  Recent Labs  03/24/16 0857 03/24/16 1751  HGB 15.0 14.9  HCT 43.9 43.1  PLT 245 285  CREATININE 1.18 1.22  TROPONINI  --  2.85*    Estimated Creatinine Clearance: 97.1 mL/min (by C-G formula based on SCr of 1.22 mg/dL).   Medical History: Past Medical History:  Diagnosis Date  . CAD S/P percutaneous coronary angioplasty    a. s/p BMS-mLAD 2010. b. DES to RCA 2012. c. 04/2015: DES to LCx and PCI to LAD c/b dissection with subsequent overlapping DES to mLAD - r/i for NSTEMI afterwards.  . CHF (congestive heart failure) (HCC)   . Depression   . Essential hypertension   . GAD (generalized anxiety disorder)   . GERD (gastroesophageal reflux disease)   . Hypercholesteremia   . Ischemic cardiomyopathy 09/14/2015   EF 25-35 percent at cath, 35-40 percent by echo  . MI (myocardial infarction) (HCC) 2010; 2013  . Morbid obesity (HCC)   . NSTEMI (non-ST elevated myocardial infarction) (HCC)   . Pre-diabetes   . ST elevation (STEMI) myocardial infarction involving left anterior descending coronary artery (HCC) 09/14/2015   3.0 x 16 mm Synergy DES to the LAD for ISR BMS  . SVT (supraventricular tachycardia) (HCC) APRROX 10 YRS AGO   PRESUMED AVNRT, ECHO 12/07 WITH EF 65%, MILD LAE  . Tobacco abuse     Assessment: 40 yo M with sx cardiac history admitted with CP/concern for ACS.  Pharmacy to dose heparin. No known anticoagulation PTA and CBC stable.  Goal of Therapy:  Heparin level 0.3-0.7 units/ml Monitor platelets by anticoagulation protocol: Yes   Plan:  1. Give 4000  units bolus x 1 2. Start heparin infusion at 1300 units/hr 3. Check anti-Xa level in 6 hours and daily while on heparin 4. Continue to monitor H&H and platelets    Pollyann SamplesAndy Edana Aguado, PharmD, BCPS 03/24/2016, 8:09 PM Pager: [phone]

## 2016-03-24 NOTE — ED Notes (Signed)
Report called and received  

## 2016-03-24 NOTE — ED Notes (Signed)
Tray ordered for pt.

## 2016-03-25 ENCOUNTER — Observation Stay (HOSPITAL_BASED_OUTPATIENT_CLINIC_OR_DEPARTMENT_OTHER): Payer: Medicare Other

## 2016-03-25 ENCOUNTER — Encounter (HOSPITAL_COMMUNITY)
Admission: EM | Disposition: A | Discharge status: Home / Self Care | Payer: Self-pay | Source: Home / Self Care | Attending: Cardiology

## 2016-03-25 DIAGNOSIS — I249 Acute ischemic heart disease, unspecified: Secondary | ICD-10-CM | POA: Diagnosis not present

## 2016-03-25 DIAGNOSIS — I214 Non-ST elevation (NSTEMI) myocardial infarction: Secondary | ICD-10-CM | POA: Diagnosis not present

## 2016-03-25 DIAGNOSIS — Z955 Presence of coronary angioplasty implant and graft: Secondary | ICD-10-CM | POA: Diagnosis not present

## 2016-03-25 DIAGNOSIS — F17210 Nicotine dependence, cigarettes, uncomplicated: Secondary | ICD-10-CM | POA: Diagnosis present

## 2016-03-25 DIAGNOSIS — R7303 Prediabetes: Secondary | ICD-10-CM | POA: Diagnosis not present

## 2016-03-25 DIAGNOSIS — Z6841 Body Mass Index (BMI) 40.0 and over, adult: Secondary | ICD-10-CM | POA: Diagnosis not present

## 2016-03-25 DIAGNOSIS — F419 Anxiety disorder, unspecified: Secondary | ICD-10-CM | POA: Diagnosis not present

## 2016-03-25 DIAGNOSIS — I252 Old myocardial infarction: Secondary | ICD-10-CM | POA: Diagnosis not present

## 2016-03-25 DIAGNOSIS — Z79899 Other long term (current) drug therapy: Secondary | ICD-10-CM | POA: Diagnosis not present

## 2016-03-25 DIAGNOSIS — I5042 Chronic combined systolic (congestive) and diastolic (congestive) heart failure: Secondary | ICD-10-CM | POA: Diagnosis not present

## 2016-03-25 DIAGNOSIS — I255 Ischemic cardiomyopathy: Secondary | ICD-10-CM | POA: Diagnosis not present

## 2016-03-25 DIAGNOSIS — R079 Chest pain, unspecified: Secondary | ICD-10-CM | POA: Diagnosis not present

## 2016-03-25 DIAGNOSIS — E7800 Pure hypercholesterolemia, unspecified: Secondary | ICD-10-CM | POA: Diagnosis not present

## 2016-03-25 DIAGNOSIS — Z8249 Family history of ischemic heart disease and other diseases of the circulatory system: Secondary | ICD-10-CM | POA: Diagnosis not present

## 2016-03-25 DIAGNOSIS — I110 Hypertensive heart disease with heart failure: Secondary | ICD-10-CM | POA: Diagnosis not present

## 2016-03-25 DIAGNOSIS — I2510 Atherosclerotic heart disease of native coronary artery without angina pectoris: Secondary | ICD-10-CM | POA: Diagnosis not present

## 2016-03-25 HISTORY — PX: CARDIAC CATHETERIZATION: SHX172

## 2016-03-25 LAB — CBC
HCT: 41.6 % (ref 39.0–52.0)
Hemoglobin: 14.1 g/dL (ref 13.0–17.0)
MCH: 29.3 pg (ref 26.0–34.0)
MCHC: 33.9 g/dL (ref 30.0–36.0)
MCV: 86.5 fL (ref 78.0–100.0)
Platelets: 244 10*3/uL (ref 150–400)
RBC: 4.81 MIL/uL (ref 4.22–5.81)
RDW: 13.5 % (ref 11.5–15.5)
WBC: 11.1 10*3/uL — ABNORMAL HIGH (ref 4.0–10.5)

## 2016-03-25 LAB — BASIC METABOLIC PANEL
Anion gap: 12 (ref 5–15)
BUN: 15 mg/dL (ref 6–20)
CO2: 23 mmol/L (ref 22–32)
Calcium: 9.2 mg/dL (ref 8.9–10.3)
Chloride: 101 mmol/L (ref 101–111)
Creatinine, Ser: 1.1 mg/dL (ref 0.61–1.24)
GFR calc Af Amer: >60 mL/min (ref >60)
GFR calc non Af Amer: >60 mL/min (ref >60)
Glucose, Bld: 122 mg/dL — ABNORMAL HIGH (ref 65–99)
Potassium: 3.8 mmol/L (ref 3.5–5.1)
Sodium: 136 mmol/L (ref 135–145)

## 2016-03-25 LAB — PROTIME-INR
INR: 1.04
Prothrombin Time: 13.6 seconds (ref 11.4–15.2)

## 2016-03-25 LAB — ECHOCARDIOGRAM COMPLETE
Height: 67 in
Weight: 4155.2 oz

## 2016-03-25 LAB — HEMOGLOBIN A1C
Hgb A1c MFr Bld: 6.3 % — ABNORMAL HIGH (ref 4.8–5.6)
Mean Plasma Glucose: 134 mg/dL

## 2016-03-25 LAB — HEPARIN LEVEL (UNFRACTIONATED)
Heparin Unfractionated: <0.1 IU/mL — ABNORMAL LOW (ref 0.30–0.70)
Heparin Unfractionated: 0.27 IU/mL — ABNORMAL LOW (ref 0.30–0.70)

## 2016-03-25 LAB — TROPONIN I: Troponin I: 3.06 ng/mL (ref <0.03)

## 2016-03-25 SURGERY — LEFT HEART CATH AND CORONARY ANGIOGRAPHY

## 2016-03-25 MED ORDER — VERAPAMIL HCL 2.5 MG/ML IV SOLN
INTRAVENOUS | Status: DC | PRN
  Administered 2016-03-25: 10 mL via INTRA_ARTERIAL

## 2016-03-25 MED ORDER — ISOSORBIDE MONONITRATE ER 30 MG PO TB24
30.0000 mg | ORAL_TABLET | Freq: Every day | ORAL | Status: DC
  Administered 2016-03-25 – 2016-03-26 (×2): 30 mg via ORAL
  Filled 2016-03-25 (×3): qty 1

## 2016-03-25 MED ORDER — SODIUM CHLORIDE 0.9 % WEIGHT BASED INFUSION
3.0000 mL/kg/h | INTRAVENOUS | Status: DC
  Administered 2016-03-25: 3 mL/kg/h via INTRAVENOUS

## 2016-03-25 MED ORDER — MIDAZOLAM HCL 2 MG/2ML IJ SOLN
INTRAMUSCULAR | Status: DC | PRN
  Administered 2016-03-25: 2 mg via INTRAVENOUS

## 2016-03-25 MED ORDER — PERFLUTREN LIPID MICROSPHERE
1.0000 mL | INTRAVENOUS | Status: DC | PRN
  Administered 2016-03-25: 4 mL via INTRAVENOUS
  Filled 2016-03-25 (×2): qty 10

## 2016-03-25 MED ORDER — SODIUM CHLORIDE 0.9% FLUSH: 3.0000 mL | Freq: Two times a day (BID) | INTRAVENOUS | Status: DC

## 2016-03-25 MED ORDER — HEPARIN BOLUS VIA INFUSION
3000.0000 [IU] | Freq: Once | INTRAVENOUS | Status: AC
  Administered 2016-03-25: 3000 [IU] via INTRAVENOUS
  Filled 2016-03-25: qty 3000

## 2016-03-25 MED ORDER — SODIUM CHLORIDE 0.9 % WEIGHT BASED INFUSION: 1.0000 mL/kg/h | INTRAVENOUS | Status: DC

## 2016-03-25 MED ORDER — IOPAMIDOL (ISOVUE-370) INJECTION 76%
INTRAVENOUS | Status: DC | PRN
  Administered 2016-03-25: 60 mL via INTRA_ARTERIAL

## 2016-03-25 MED ORDER — HEPARIN SODIUM (PORCINE) 1000 UNIT/ML IJ SOLN
INTRAMUSCULAR | Status: AC
  Filled 2016-03-25: qty 1

## 2016-03-25 MED ORDER — HEPARIN SODIUM (PORCINE) 1000 UNIT/ML IJ SOLN
INTRAMUSCULAR | Status: DC | PRN
  Administered 2016-03-25: 5000 [IU] via INTRAVENOUS

## 2016-03-25 MED ORDER — ENOXAPARIN SODIUM 40 MG/0.4ML ~~LOC~~ SOLN
40.0000 mg | SUBCUTANEOUS | Status: DC
  Filled 2016-03-25: qty 0.4

## 2016-03-25 MED ORDER — SODIUM CHLORIDE 0.9% FLUSH: 3.0000 mL | INTRAVENOUS | Status: DC | PRN

## 2016-03-25 MED ORDER — LIDOCAINE HCL (PF) 1 % IJ SOLN
INTRAMUSCULAR | Status: DC | PRN
  Administered 2016-03-25: 2 mL

## 2016-03-25 MED ORDER — SODIUM CHLORIDE 0.9 % IV SOLN: 250.0000 mL | INTRAVENOUS | Status: DC | PRN

## 2016-03-25 MED ORDER — FENTANYL CITRATE (PF) 100 MCG/2ML IJ SOLN
INTRAMUSCULAR | Status: DC | PRN
  Administered 2016-03-25: 50 ug via INTRAVENOUS

## 2016-03-25 MED ORDER — HEPARIN (PORCINE) IN NACL 2-0.9 UNIT/ML-% IJ SOLN
INTRAMUSCULAR | Status: DC | PRN
  Administered 2016-03-25: 1500 mL

## 2016-03-25 MED ORDER — FENTANYL CITRATE (PF) 100 MCG/2ML IJ SOLN
INTRAMUSCULAR | Status: AC
  Filled 2016-03-25: qty 2

## 2016-03-25 MED ORDER — HEPARIN (PORCINE) IN NACL 2-0.9 UNIT/ML-% IJ SOLN
INTRAMUSCULAR | Status: AC
  Filled 2016-03-25: qty 1000

## 2016-03-25 MED ORDER — NITROGLYCERIN 1 MG/10 ML FOR IR/CATH LAB
INTRA_ARTERIAL | Status: AC
  Filled 2016-03-25: qty 10

## 2016-03-25 MED ORDER — LIDOCAINE HCL (PF) 1 % IJ SOLN
INTRAMUSCULAR | Status: AC
  Filled 2016-03-25: qty 30

## 2016-03-25 MED ORDER — SODIUM CHLORIDE 0.9 % WEIGHT BASED INFUSION
1.0000 mL/kg/h | INTRAVENOUS | Status: AC
  Administered 2016-03-25: 1 mL/kg/h via INTRAVENOUS

## 2016-03-25 MED ORDER — MIDAZOLAM HCL 2 MG/2ML IJ SOLN
INTRAMUSCULAR | Status: AC
  Filled 2016-03-25: qty 2

## 2016-03-25 MED ORDER — IOPAMIDOL (ISOVUE-370) INJECTION 76%
INTRAVENOUS | Status: AC
  Filled 2016-03-25: qty 100

## 2016-03-25 SURGICAL SUPPLY — 10 items
CATH INFINITI 5 FR JL3.5 (CATHETERS) ×2 IMPLANT
CATH INFINITI 5FR ANG PIGTAIL (CATHETERS) ×3 IMPLANT
CATH INFINITI JR4 5F (CATHETERS) ×2 IMPLANT
DEVICE RAD COMP TR BAND LRG (VASCULAR PRODUCTS) ×2 IMPLANT
GLIDESHEATH SLEND SS 6F .021 (SHEATH) ×2 IMPLANT
KIT HEART LEFT (KITS) ×3 IMPLANT
PACK CARDIAC CATHETERIZATION (CUSTOM PROCEDURE TRAY) ×3 IMPLANT
TRANSDUCER W/STOPCOCK (MISCELLANEOUS) ×3 IMPLANT
TUBING CIL FLEX 10 FLL-RA (TUBING) ×3 IMPLANT
WIRE SAFE-T 1.5MM-J .035X260CM (WIRE) ×2 IMPLANT

## 2016-03-25 NOTE — Progress Notes (Signed)
ANTICOAGULATION CONSULT NOTE - Follow Up Consult  Pharmacy Consult for heparin Indication: recent NSTEMI w/ ongoing CP  Labs:  Recent Labs  03/24/16 0857 03/24/16 1751 03/24/16 2252 03/25/16 0518  HGB 15.0 14.9  --  14.1  HCT 43.9 43.1  --  41.6  PLT 245 285  --  244  HEPARINUNFRC  --   --   --  <0.10*  CREATININE 1.18 1.22  --  1.10  TROPONINI  --  2.85* 3.38* 3.06*     Assessment: 40yo male undetectable on heparin with initial dosing for CP w/ recent NSTEMI.  Goal of Therapy:  Heparin level 0.3-0.7 units/ml   Plan:  Will rebolus with heparin 3000 units and increase gtt by 4 units/kg/hr to 1700 units/hr and check level in 6hr.  Vernard GamblesVeronda Catheryne Deford, PharmD, BCPS  03/25/2016,6:51 AM

## 2016-03-25 NOTE — Hospital Discharge Follow-Up (Signed)
Transitional Care Clinic Care Coordination Note:  Admit date:  03/24/16 Discharge date: ? Discharge Disposition: Home when stable Patient contact: [phone] (mobile)  Patient has a history of ischemic cardiomyopathy, CAD, HTN, HLD, pre-diabetes. Hospitalized for NSTEMI. This Case Manager met with patient during last admission and discussed the [facility]. Patient was agreeable to follow-up with the [facility], and had an appointment scheduled for 03/24/16 at 1100 with Dr. Jarold Song; however, appointment was cancelled as patient presented to ED with chest pain. This Case Manager once again met with patient to determine plan for post-discharge follow-up, and patient once again agreeable to post-discharge care at the [facility].   Patient scheduled for Transitional Care appointment on 04/05/16 at 1030 with Dr. Jarold Song.  Clinic information and appointment time provided to patient. Appointment information also placed on AVS.  Assessment:       Home Environment: Patient lives with his mother in a private residence.       Support System: mother, friends       Level of functioning: Independent       Home DME: none       Home care services: none       Transportation: Patient typically drives to his medical appointments.        Medications: Patient gets his medications from the pharmacy at [place]. Patient indicated he picked up all of his discharge medications on Monday 03/21/16. Inquired if patient spoke with Patient Assistance Coordinator at pharmacy about PASS program for [place], and patient indicated he had. Patient indicated he was aware of the documents he needed to obtain for the application. Discussed the importance of obtaining the information as soon as possible so application can be processed to determine eligibility for [place] assistance. Patient verbalized understanding. Inquired if patient was taking his medications as  prescribed once he picked them up on Monday, and patient indicated he was taken his medications as prescribed. Stressed importance of medication compliance with patient, and patient verbalized understanding.        Identified Barriers: uninsured-Patient once again reminded he would benefit from meeting with Development worker, community at [place] to determine if eligible for the [employer] or [employer]. Patient verbalized understanding. Additional barriers include: inability to afford medications and lack of PCP.        PCP: previously Chari Manning, NP-Patient able to designate Dr. Jarold Song or another provider at [facility] (or clinic of choice) as PCP after 30 days of medical management with the [facility].             Arranged services:        Services communicated to Jacqlyn Krauss, RN CM

## 2016-03-25 NOTE — Interval H&P Note (Signed)
History and Physical Interval Note:  03/25/2016 2:16 PM  Peter BloomMark E Meyer  has presented today for surgery, with the diagnosis of NSTEMI  The various methods of treatment have been discussed with the patient and family. After consideration of risks, benefits and other options for treatment, the patient has consented to  Procedure(s): Left Heart Cath and Coronary Angiography (N/A) as a surgical intervention .  The patient's history has been reviewed, patient examined, no change in status, stable for surgery.  I have reviewed the patient's chart and labs.  Questions were answered to the patient's satisfaction.   Cath Lab Visit (complete for each Cath Lab visit)  Clinical Evaluation Leading to the Procedure:   ACS: Yes.    Non-ACS:    Anginal Classification: CCS IV  Anti-ischemic medical therapy: Maximal Therapy (2 or more classes of medications)  Non-Invasive Test Results: No non-invasive testing performed  Prior CABG: No previous CABG       Theron Aristaeter Red Lake HospitalJordanMD,FACC 03/25/2016 2:16 PM

## 2016-03-25 NOTE — Progress Notes (Signed)
Dr. Clarnce FlockFudim was up to see patient and was updated on elevated Troponin 3.28, patient remains on NTG drip at 10 mcg/min with HR 49-68 and B/P 90-100's/40-50's with MAP 60 and above, will continue to monitor. Patient has been NPO all night for cath this am, no other changes and patient has been pain free.

## 2016-03-25 NOTE — Plan of Care (Signed)
Problem: Consults Goal: Chest Pain Patient Education (See Patient Education module for education specifics.)  Outcome: Completed/Met Date Met: 03/25/16 Pt received education regarding chest pain   Problem: Phase I Progression Outcomes Goal: MD aware of Cardiac Marker results Outcome: Completed/Met Date Met: 03/25/16 MD aware of positive cardiac markers

## 2016-03-25 NOTE — Progress Notes (Signed)
  Echocardiogram 2D Echocardiogram has been performed.  Janalyn HarderWest, Stephanieann Popescu R 03/25/2016, 11:44 AM

## 2016-03-25 NOTE — Progress Notes (Signed)
Patient Profile: This is a 40 year old male well known to our service with known CAD prior history of noncompliance and history of ischemic cardiomyopathy LVEF 35-40% in January 2017, when he was admitted with a STEMI when he stopped his antiplatelet medicine and was found to have occlusion of mid LAD within the previously placed bare-metal stent and he underwent successful PTCA restenting of mid LAD.  He was just discharged after hospitalization on 03/16/2016 for unstable angina when he underwent left cardiac catheterization that showed patent proximal to distal LAD stent, total occulusion of apical LAD beyond previously placed stent. Patent mid circumflex stent. Total occlusion of the proximal to mid RCA with right to right and left-to-right collaterals, with faint opacification. He had successful balloon angioplasty only on the distal total occlusion in the LAD reducing the 100% stenosis to 60% with TIMI grade 3 flow. No stenting done because of the past recurrent stent failure in each vascular territory.  He presented back to ED 03/24/16 with retrosternal chest pain that didn't respond to nitroglycerin. EKG showed NSR with anterior infarct, nonspecific ST-T wave abnormalities unchanged from the prior EKGs,  with some nonspecific ST changes in anterolateral leads. Initial troponin negative. He was hypertensive. He was admitted for observation/ rule-out and medication adjustment.    Subjective: Currently CP free on IV heparin and IV nitro. Resting comfortably.    Objective: Vital signs in last 24 hours: Temp:  [97.9 F (36.6 C)-98.4 F (36.9 C)] 98.1 F (36.7 C) (08/11 0410) Pulse Rate:  [55-88] 57 (08/11 0555) Resp:  [10-22] 16 (08/11 0410) BP: (89-164)/(34-101) 91/59 (08/11 0555) SpO2:  [96 %-100 %] 99 % (08/11 0555) Weight:  [259 lb 11.2 oz (117.8 kg)-263 lb (119.3 kg)] 259 lb 11.2 oz (117.8 kg) (08/11 0410) Last BM Date: 03/24/16  Intake/Output from previous day: 08/10 0701 -  08/11 0700 In: 281.8 [P.O.:120; I.V.:161.8] Out: 500 [Urine:500] Intake/Output this shift: No intake/output data recorded.  Medications . aspirin EC  81 mg Oral Daily  . atorvastatin  80 mg Oral q1800  . carvedilol  6.25 mg Oral BID WC  . [START ON 03/26/2016] enoxaparin (LOVENOX) injection  40 mg Subcutaneous Q24H  . fenofibrate  160 mg Oral Daily  . isosorbide mononitrate  30 mg Oral Daily  . lisinopril  5 mg Oral Daily  . sodium chloride flush  3 mL Intravenous Q12H  . spironolactone  25 mg Oral Daily  . ticagrelor  90 mg Oral BID    PE: General appearance: alert, cooperative, no distress and moderately obese Neck: no carotid bruit and no JVD Lungs: clear to auscultation bilaterally Heart: regular rate and rhythm, S1, S2 normal, no murmur, click, rub or gallop Extremities: no LEE Pulses: 2+ and symmetric Skin: warm and dry Neurologic: Grossly normal  Lab Results:   Recent Labs  03/24/16 0857 03/24/16 1751 03/25/16 0518  WBC 9.2 11.6* 11.1*  HGB 15.0 14.9 14.1  HCT 43.9 43.1 41.6  PLT 245 285 244   BMET  Recent Labs  03/24/16 0857 03/24/16 1751 03/25/16 0518  NA 138  --  136  K 4.1  --  3.8  CL 102  --  101  CO2 26  --  23  GLUCOSE 164*  --  122*  BUN 11  --  15  CREATININE 1.18 1.22 1.10  CALCIUM 9.5  --  9.2   Cardiac Panel (last 3 results)  Recent Labs  03/24/16 1751 03/24/16 2252 03/25/16 0518  TROPONINI 2.85* 3.38*  3.06*    Studies/Results: 2D Echo- pending   Assessment/Plan  Active Problems:   Chest pain with high risk for cardiac etiology   1. NSTEMI: Patient with complex CAD history as outlined above, s/p STEMI in 08/2015 treated with PCI, recent repeat cath with PCI 1 week ago for NSTEMI. Now with recurrent CP and has ruled in for another NSTEMI with troponin peaking at 3.38 (initial troponin was negative). He is currently CP free. Continue IV heparin and NTG. Plan for repeat cath today with Dr. Tresa Endo +/- PCI. Continue ASA,  Brilinta, high intensity Lipitor and Coreg. May need to hold both lisinopril and spironolactone today, in anticipation of [facility], to reduce risk of renal insufficiency. 2D echo pending. Keep NPO.    LOS: 0 days   Brittainy M. Delmer Islam 03/25/2016 8:09 AM   The patient was seen, examined and discussed with Brittainy M. Sharol Harness, PA-C and I agree with the above.   40 year old patient with known coronary artery disease, prior history of noncompliance and history of ischemic cardiomyopathy LVEF 35-40% in January 2017, when he was admitted with a STEMI when he stopped his antiplatelet medicine and was found to have occlusion of mid LAD within the previously placed bare-metal stent and he underwent successful PTCA restenting of mid LAD.Marland Kitchen  He was just discharged after hospitalization on 03/16/2016 for unstable angina when he underwent left cardiac catheterization that showed patent proximal to distal LAD stent, total occulusion of apical LAD beyond previously placed stent. Patent mid circumflex stent. Total occlusion of the proximal to mid RCA with right to right and left-to-right collaterals, with faint opacification. He had successful balloon angioplasty only on the distal total occlusion in the LAD reducing the 100% stenosis to 60% with TIMI grade 3 flow. No stenting done because of the past recurrent stent failure in each vascular territory.  He presented yesterday with recurrent chest pain and ruled in for non-STEMI with maximum troponin of 3.38 now down trending to 3.0. He underwent left cardiac catheterization today by Dr. [place] that showed   Prox Cx to Mid Cx lesion, 10 %stenosed.  Prox LAD to Dist LAD lesion, 0 %stenosed.  Prox LAD lesion, 0 %stenosed.  Prox RCA lesion, 100 %stenosed.  Dist LAD lesion, 25 %stenosed.  2nd Diag lesion, 45 %stenosed.   1. Single vessel obstructive CAD with CTO of the proximal RCA 2. Continued patency of stents in the LAD and LCx 3. 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  Plan: continue medical therapy. No clear reason for recent NSTEMI. If patient has refractory angina despite optimal medical therapy could consider CTO PCI of the RCA but patient is not ideal due to history of noncompliance.   His echocardiogram today shows LVEF of 30-35% with grade 2 diastolic dysfunction and elevated filling pressures. I will talk to EP about consideration for ICD. If necessary we might consider cardiac MRI for LVEF evaluation and viability evaluation in the right coronary territory. This can be done as outpatient.   His blood pressure was elevated we will place Imdur 30 mg daily and spironolactone 25 mg daily.  Tobias Alexander 03/25/2016

## 2016-03-25 NOTE — H&P (View-Only) (Signed)
     Patient's troponin I 2.85. Difficult to tell if trending down from previous NSTEMI last week (peak trop 15.33). POC trop 0.04 today. Patient with ongoing chest pain. Will start heparin and nitro gtt and give IV morphine PRN. Discussed with Fellow, Dr. Clarnce FlockFudim, who will go check on patient.    Cline CrockKathryn Lileigh Fahringer PA-C  [facility]

## 2016-03-26 ENCOUNTER — Encounter (HOSPITAL_COMMUNITY): Payer: Self-pay | Admitting: Nurse Practitioner

## 2016-03-26 DIAGNOSIS — I5043 Acute on chronic combined systolic (congestive) and diastolic (congestive) heart failure: Secondary | ICD-10-CM | POA: Diagnosis present

## 2016-03-26 DIAGNOSIS — R079 Chest pain, unspecified: Secondary | ICD-10-CM

## 2016-03-26 DIAGNOSIS — I5042 Chronic combined systolic (congestive) and diastolic (congestive) heart failure: Secondary | ICD-10-CM | POA: Diagnosis present

## 2016-03-26 MED ORDER — ISOSORBIDE MONONITRATE ER 30 MG PO TB24: 30.0000 mg | ORAL_TABLET | Freq: Every day | ORAL | 6 refills | Status: DC

## 2016-03-26 MED ORDER — SPIRONOLACTONE 25 MG PO TABS: 25.0000 mg | ORAL_TABLET | Freq: Every day | ORAL | 6 refills | Status: DC

## 2016-03-26 NOTE — Progress Notes (Addendum)
Patient Name: Peter Russo E Garverick Date of Encounter: 03/26/2016    SUBJECTIVE: He feels well this morning. Denies episodes of chest pain. His medication regimen has not been significantly altered. He has not ambulated yet today.  TELEMETRY:  Normal sinus rhythm Vitals:   03/25/16 1748 03/25/16 1941 03/26/16 0556 03/26/16 0915  BP: 95/60 (!) 117/58 (!) 101/50 (!) 115/56  Pulse: 67 73 61   Resp: (!) 23 (!) 22 20   Temp:  98.8 F (37.1 C) 97.7 F (36.5 C)   TempSrc:  Oral Oral   SpO2: 97% 99% 100%   Weight:   262 lb 6.4 oz (119 kg)   Height:        Intake/Output Summary (Last 24 hours) at 03/26/16 1028 Last data filed at 03/26/16 0845  Gross per 24 hour  Intake              720 ml  Output                0 ml  Net              720 ml   LABS: Basic Metabolic Panel:  Recent Labs  57/84/6907/06/01 0857 03/24/16 1751 03/25/16 0518  NA 138  --  136  K 4.1  --  3.8  CL 102  --  101  CO2 26  --  23  GLUCOSE 164*  --  122*  BUN 11  --  15  CREATININE 1.18 1.22 1.10  CALCIUM 9.5  --  9.2   CBC:  Recent Labs  03/24/16 1751 03/25/16 0518  WBC 11.6* 11.1*  HGB 14.9 14.1  HCT 43.1 41.6  MCV 86.0 86.5  PLT 285 244   Cardiac Enzymes:  Recent Labs  03/24/16 1751 03/24/16 2252 03/25/16 0518  TROPONINI 2.85* 3.38* 3.06*   BNP: Invalid input(s): POCBNP Hemoglobin A1C:  Recent Labs  03/24/16 1751  HGBA1C 6.3*    Radiology/Studies:   Coronary angiography performed yesterday was reviewed. Site of distal LAD angioplasty from a week ago is widely patent with perhaps 50% eccentric stenosis. Flow is improved.  Physical Exam: Blood pressure (!) 115/56, pulse 61, temperature 97.7 F (36.5 C), temperature source Oral, resp. rate 20, height 5\' 7"  (1.702 m), weight 262 lb 6.4 oz (119 kg), SpO2 100 %. Weight change: -9.6 oz (-0.272 kg)  Wt Readings from Last 3 Encounters:  03/26/16 262 lb 6.4 oz (119 kg)  03/17/16 266 lb 5.1 oz (120.8 kg)  02/01/16 264 lb (119.7 kg)     Obese. No distress. Lungs clear. Cardiac exam reveals no rub or gallop. Right radial cath site is unremarkable.  ASSESSMENT:  1. Coronary artery disease with multivessel involvement including chronic total occlusion of previously stented RCA, widely patent circumflex without evidence of stent restenosis, and LAD with a long segment of overlapping stents in the proximal to distal vessel. The distal LAD beyond the stents as site of recent angioplasty during acute infarction. Recathed yesterday demonstrated this area is widely patent with 50% narrowing distal to the previously placed stents.  2. Non-ST elevation myocardial infarction on this occasion, without obvious culprit site. He is potentially having coronary artery spasm or transient recurrent thrombosis.  Plan:  1. I rediscussed the importance of dual antiplatelet therapy. 2. Ambulate and if no recurrent symptoms, discharge later today. 3. F/u should be in accord with prior discharge. Primary cardiologist should consider AICD referral.  Signed, Lyn RecordsHenry W Taeya Theall III 03/26/2016, 10:28  AM

## 2016-03-26 NOTE — Discharge Instructions (Signed)
***  PLEASE REMEMBER TO BRING ALL OF YOUR MEDICATIONS TO EACH OF YOUR FOLLOW-UP OFFICE VISITS.  NO HEAVY LIFTING OR SEXUAL ACTIVITY X 7 DAYS. NO DRIVING X 2-3 DAYS. NO SOAKING BATHS, HOT TUBS, POOLS, ETC., X 7 DAYS.  

## 2016-03-26 NOTE — Discharge Summary (Signed)
Discharge Summary    Patient ID: Peter Russo,  MRN: [id number], DOB/AGE: 09/17/1975 40 y.o.  Admit date: 03/24/2016 Discharge date: 03/26/2016  Primary Care Provider: Ambrose Finland Primary Cardiologist: P. Eden Emms, MD   Discharge Diagnoses    Principal Problem:   NSTEMI (non-ST elevated myocardial infarction) [facility])  **s/p cath this admission revealing stable anatomy.  Active Problems:   CAD S/P percutaneous coronary angioplasty   Cardiomyopathy, ischemic   Essential hypertension   H/O medication noncompliance   Chronic combined systolic and diastolic CHF (congestive heart failure) (HCC)   Morbid obesity (HCC)   Hypercholesteremia  Allergies No Known Allergies  Diagnostic Studies/Procedures    2D Echocardiogram 8.11.2017  Study Conclusions   - Left ventricle: The cavity size was normal. Wall thickness was   increased in a pattern of mild LVH. Systolic function was   moderately to severely reduced. The estimated ejection fraction   was in the range of 30% to 35%. Diffuse hypokinesis. There is   akinesis of the apical myocardium. Features are consistent with a   pseudonormal left ventricular filling pattern, with concomitant   abnormal relaxation and increased filling pressure (grade 2   diastolic dysfunction).   Impressions:   - Technically difficult; definity used; diffuse hypokinesis with   apical akinesis; overall moderate to severe LV dysfunction; grade   2 diastolic dysfunction; indeterminant LV filling pressure. _____________  Cardiac Catheterization 8.11.2017  Coronary Findings  Dominance: Right  Left Anterior Descending  Prox LAD lesion, 0% stenosed. Prox LAD lesion with no stenosis was previously treated.  Prox LAD to Dist LAD lesion, 0% stenosed. Prox LAD to Dist LAD lesion with no stenosis was previously treated.  Dist LAD lesion, 25% stenosed.  First Diagonal Branch  Vessel is small in size.  Second Diagonal Branch  2nd Diag lesion, 45%  stenosed. The lesion is dissected.  Left Circumflex  Prox Cx to Mid Cx lesion, 10% stenosed. The lesion was previously treated.  Third Obtuse Marginal Branch  Vessel is small in size.  Right Coronary Artery  Dist RCA filled by collaterals from Prox RCA.  Prox RCA lesion, 100% stenosed.    Coronary Diagrams     _____________    History of Present Illness     40 year old male with prior history of coronary artery disease, ischemic myopathy, hypertension, hyperlipidemia, obesity, GERD, and prediabetes. He has previously undergone stenting of the left anterior descending, complicated by in-stent restenosis with anterior STEMI in January 2007 requiring repeat intervention. More recently, he underwent catheterization and percutaneous intervention to the apical LAD in early August secondary to recurrent chest pain. He has a known chronic total occlusion of the right coronary artery with left-to-right and right to right collaterals, and this has been medically managed. Following his recent discharge, he was in his usual state of health until the morning of August 10, when he developed recurrent chest discomfort and presented to the ED. There, initial troponin was 0.04 and EKG showed no acute changes. He was admitted for further evaluation.  Hospital Course     Consultants: None   Following admission, troponin rose to a peak of 3.38 consistent with non-STEMI. He underwent diagnostic catheterization which revealed patency of the previously placed LAD and circumflex stents. As above, he has a known chronic total occlusion of the right coronary artery. There were no culprits for his symptoms and medical therapy was recommended. Echocardiography was performed and showed an EF of 30-35% with diffuse hypokinesis and grade  2 diastolic dysfunction. He has been maintained on aspirin, statin, beta blocker, ACE inhibitor, and brilinta therapy and in addition, long acting nitrate and spironolactone have been added.  He has been ambulated without difficulty and will be discharged home today in good condition. He will be seen within the next week and we will plan to refer to EP for future consideration of ICD therapy. _____________  Discharge Vitals Blood pressure (!) 115/56, pulse 61, temperature 97.7 F (36.5 C), temperature source Oral, resp. rate 20, height 5\' 7"  (1.702 m), weight 262 lb 6.4 oz (119 kg), SpO2 100 %.  Filed Weights   03/24/16 1700 03/25/16 0410 03/26/16 0556  Weight: 260 lb 11.2 oz (118.3 kg) 259 lb 11.2 oz (117.8 kg) 262 lb 6.4 oz (119 kg)    Labs & Radiologic Studies    CBC  Recent Labs  03/24/16 1751 03/25/16 0518  WBC 11.6* 11.1*  HGB 14.9 14.1  HCT 43.1 41.6  MCV 86.0 86.5  PLT 285 244   Basic Metabolic Panel  Recent Labs  03/24/16 0857 03/24/16 1751 03/25/16 0518  NA 138  --  136  K 4.1  --  3.8  CL 102  --  101  CO2 26  --  23  GLUCOSE 164*  --  122*  BUN 11  --  15  CREATININE 1.18 1.22 1.10  CALCIUM 9.5  --  9.2   Cardiac Enzymes  Recent Labs  03/24/16 1751 03/24/16 2252 03/25/16 0518  TROPONINI 2.85* 3.38* 3.06*   Hemoglobin A1C  Recent Labs  03/24/16 1751  HGBA1C 6.3*  _____________  Dg Chest 2 View  Result Date: 03/24/2016 CLINICAL DATA:  Chest pain EXAM: CHEST  2 VIEW COMPARISON:  03/16/2016 FINDINGS: Cardiac shadow is within normal limits. Coronary stenting is again identified. The lungs are well aerated bilaterally. Minimal platelike atelectasis in the left mid lung is noted. No sizable effusion is seen. No bony abnormality is noted. IMPRESSION: Minimal platelike atelectasis in the left mid lung. Electronically Signed   By: Alcide Clever M.D.   On: 03/24/2016 09:06   Dg Chest Port 1 View  Result Date: 03/16/2016 CLINICAL DATA:  40 year old male with chest pain EXAM: PORTABLE CHEST 1 VIEW COMPARISON:  Chest radiograph dated 12/19/2014 FINDINGS: Single portable view of chest does not demonstrate a focal consolidation. There is no pleural  effusion or pneumothorax. Stable top-normal cardiac silhouette. No acute osseous pathology. IMPRESSION: No active disease. Electronically Signed   By: Elgie Collard M.D.   On: 03/16/2016 06:43   Disposition   Pt is being discharged home today in good condition.  Follow-up Plans & Appointments    Follow-up Information    [facility] Follow up on 04/05/2016.   Why:  Transitional [facility] Clinic appointment on 04/05/16 at 10:30 am with Dr. Venetia Night. Contact information: [address] [place] [address] [phone]       Cline Crock, PA-C Follow up on 03/30/2016.   Specialties:  Cardiology, Radiology Why:  3:00 PM Contact information: [address] [place] [address] [phone]        Charlton Haws, MD Follow up on 04/25/2016.   Specialty:  Cardiology Why:  8:15 AM Contact information: [address] [place] [address] [phone]            Discharge Medications   Current Discharge Medication List    START taking these medications   Details  isosorbide mononitrate (  IMDUR) 30 MG 24 hr tablet Take 1 tablet (30 mg total) by mouth daily. Qty: 30 tablet, Refills: 6    spironolactone (ALDACTONE) 25 MG tablet Take 1 tablet (25 mg total) by mouth daily. Qty: 30 tablet, Refills: 6      CONTINUE these medications which have NOT CHANGED   Details  aspirin 81 MG EC tablet Take 1 tablet (81 mg total) by mouth daily. Qty: 30 tablet, Refills: 11    atorvastatin (LIPITOR) 80 MG tablet Take 1 tablet (80 mg total) by mouth daily at 6 PM. Qty: 90 tablet, Refills: 3    carvedilol (COREG) 6.25 MG tablet Take 1 tablet (6.25 mg total) by mouth 2 (two) times daily with a meal. Qty: 60 tablet, Refills: 11    fenofibrate (TRICOR) 48 MG tablet Take 1 tablet (48 mg total) by mouth daily. Qty: 30 tablet, Refills: 11   Associated Diagnoses: Elevated triglycerides with high cholesterol;  Medication management    lisinopril (PRINIVIL,ZESTRIL) 5 MG tablet Take 1 tablet (5 mg total) by mouth daily. Qty: 90 tablet, Refills: 3    nitroGLYCERIN (NITROSTAT) 0.4 MG SL tablet Place 0.4 mg under the tongue every 5 (five) minutes as needed for chest pain (3 x).    ticagrelor (BRILINTA) 90 MG TABS tablet Take 1 tablet (90 mg total) by mouth 2 (two) times daily. Qty: 60 tablet, Refills: 11      STOP taking these medications     ALPRAZolam (XANAX) 0.5 MG tablet      sertraline (ZOLOFT) 50 MG tablet          Aspirin prescribed at discharge?  Yes High Intensity Statin Prescribed? (Lipitor 40-80mg  or Crestor 20-40mg ): Yes Beta Blocker Prescribed? Yes For EF <40%, was ACEI/ARB Prescribed? Yes ADP Receptor Inhibitor Prescribed? (i.e. Plavix etc.-Includes Medically Managed Patients): Yes For EF <40%, Aldosterone Inhibitor Prescribed? Yes Was EF assessed during THIS hospitalization? Yes Was Cardiac Rehab II ordered? (Included Medically managed Patients): Yes   Outstanding Labs/Studies   F/u bmet @ office f/u in setting of new spironolactone start. Patient will require outpt EP referral.  Duration of Discharge Encounter   Greater than 30 minutes including physician time.  Signed, Nicolasa Duckinghristopher Berge NP 03/26/2016, 1:44 PM  The patient has been seen in conjunction with Ward Givenshris Berge, NP. All aspects of care have been considered and discussed. The patient has been personally interviewed, examined, and all clinical data has been reviewed.   Repeat coronary angiogram was personally reviewed. The site of the recent distal LAD angioplasty site was widely patent and appeared improved compared with the acute result obtained 2 weeks ago. No new abnormalities were noted.  Suspect he is having either coronary spasm or transient episodes of thrombosis.  Hopefully he is compliant with his medical regimen.  We rediscussed the importance of dual antiplatelet therapy.  He is discharged in  improved condition and will follow-up as previously planned.

## 2016-03-28 ENCOUNTER — Encounter (HOSPITAL_COMMUNITY): Payer: Self-pay | Admitting: Cardiology

## 2016-03-28 MED FILL — Nitroglycerin IV Soln 100 MCG/ML in D5W: INTRA_ARTERIAL | Qty: 10 | Status: AC

## 2016-03-28 MED FILL — ISOSORBIDE MN ER 30 MG TAB: 30 | 30 days supply | Qty: 30 | Fill #0

## 2016-03-28 MED FILL — ?SPIRONOLACTONE 25 MG TABLE: 25 | 30 days supply | Qty: 30 | Fill #0

## 2016-03-29 ENCOUNTER — Telehealth: Payer: Self-pay

## 2016-03-29 NOTE — Telephone Encounter (Signed)
Transitional [facility] Clinic Post-discharge Follow-Up Phone Call:  Date of Discharge:03/26/2016 Principal Discharge Diagnosis(es): NSTEMI - s/p cardiac cath, CAD, HTN, ischemic cardiomyopathy, chronic combined systolic and diastolic CHF Post-discharge Communication: (Clearly document all attempts clearly and date contact made) Call placed to the patient Call Completed: Yes                    With Whom: Patient Interpreter Needed: No     Please check all that apply:  X Patient is knowledgeable of his/her condition(s) and/or treatment. X Patient is caring for self at home.  - He stated that he is feeling " all right" and denied having any chest pain. He said that he has " help "  If he needs it but did not stated who helps him just said that he has help.  ? Patient is receiving assist at home from family and/or caregiver. Family and/or caregiver is knowledgeable of patient's condition(s) and/or treatment. ? Patient is receiving home health services. If so, name of agency.     Medication Reconciliation:  X  Medication list reviewed with patient   - He said that he did not have the list of medications with him at the time of the call. He noted that he was instructed to " start taking 2" medications and then said that the doctor  " took me off 2."  Explained that he is to start taking the imdur and aldactone and stop the xanax and zoloft and he stated that he understands.  X  Patient obtained all discharge medications. If not, why? - he said that he has all of his medications and is taking them as instructed.    Activities of Daily Living:  X Independent  - no DME ordered ? Needs assist (describe; ? home DME used) ? Total Care (describe, ? home DME used)   Community resources in place for patient:  X None  ? Home Health/Home DME ? Assisted Living ? Support Group        Questions/Concerns discussed: Reminded him that he needs to meet with the Financial Counselor to apply for an [employer]  Card and he said that he understands. He will need to schedule an appointment when he comes to his next clinic appointment. Reminded him that he has an appointment with Dr Venetia NightAmao on 04/05/16 @ 1030 and he said that he was aware of that.  He said that he did not have any questions/concerns at this time.

## 2016-03-29 NOTE — Progress Notes (Signed)
Cardiology Office Note    Date:  03/30/2016   ID:  Peter Russo, DOB [birth year], MRN [id number]  PCP:  Ambrose Finland, NP  Cardiologist:  Dr. Eden Emms  CC: post hospital follow up- NSTEMI  History of Present Illness:  Peter Russo is a 40 y.o. male with a history of CAD s/p multiple previous PCIs to LAD and LCx and known CTO to RCA, ICM, HTN, HLD, obesity, GERD and pre diabetes who presents to clinic for post hospital follow up after a recent admission for NSTEMI.   He has a hx of CAD and has previously undergone stenting of the left anterior descending, complicated by in-stent restenosis with anterior STEMI in January 2007 requiring repeat intervention. More recently, he underwent catheterization and percutaneous intervention to the apical LAD in early August secondary to recurrent chest pain. He has a known chronic total occlusion of the right coronary artery with left-to-right and right to right collaterals, and this has been medically managed. Following his recent discharge, he was in his usual state of health until the morning of March 24, 2016, when he developed recurrent chest discomfort and presented to the ED. There, initial troponin was 0.04 and EKG showed no acute changes. He was admitted for further evaluation.  Following admission, troponin rose to a peak of 3.38 consistent with non-STEMI. He underwent diagnostic catheterization which revealed patency of the previously placed LAD and circumflex stents. As above, he has a known chronic total occlusion of the right coronary artery. There were no culprits for his symptoms and medical therapy was recommended. Echocardiography was performed and showed an EF of 30-35% with diffuse hypokinesis and grade 2 diastolic dysfunction. He has been maintained on aspirin, statin, beta blocker, ACE inhibitor, and brilinta therapy and in addition, long acting nitrate and spironolactone have been added.   Today he presents to clinic for follow  up. No more chest pain. He has had some SOB mostly with exertion like walking up stops. No LE edema, orthopnea or PND. No dizziness or syncope. No palpitations. No blood in stool or urine      Past Medical History:  Diagnosis Date  . Anxiety   . CAD S/P percutaneous coronary angioplasty    a. s/p BMS-mLAD 2010. b. DES to RCA 2012. c. 04/2015: DES to LCx and PCI to LAD c/b dissection with subsequent overlapping DES to mLAD - r/i for NSTEMI afterwards; d. 08/2015 Ant STEMI in setting of noncompliance->170mLAD ISR (DES); e. 03/2016 PCI of apical LAD; f. 03/2016 Relook Cath: patent LAD stents, D1 25, D2 45, LCX 10p/m, RCA 100p CTO-->Med Rx.  . Chronic combined systolic and diastolic CHF (congestive heart failure) (HCC)    a. 03/2016 Echo: EF 30-35%, Gr2 DD  . Depression   . Essential hypertension   . GAD (generalized anxiety disorder)   . GERD (gastroesophageal reflux disease)   . Hypercholesteremia   . Ischemic cardiomyopathy 09/14/2015   a. prior EF 25-35 percent at cath, 35-40 percent by echo; b. 03/2016 Echo: EF 30-35%, diff HK, Gr2 DD.  . Morbid obesity (HCC)   . Pre-diabetes   . ST elevation (STEMI) myocardial infarction involving left anterior descending coronary artery (HCC) 09/14/2015   3.0 x 16 mm Synergy DES to the LAD for ISR BMS  . SVT (supraventricular tachycardia) (HCC) APRROX 10 YRS AGO   PRESUMED AVNRT, ECHO 12/07 WITH EF 65%, MILD LAE  . Tobacco abuse     Past Surgical History:  Procedure Laterality Date  .  CARDIAC CATHETERIZATION N/A 04/23/2015   Procedure: Left Heart Cath and Coronary Angiography;  Surgeon: Wendall StadePeter C Nishan, MD;  [facility];  Service: Cardiovascular;  Laterality: N/A;  . CARDIAC CATHETERIZATION N/A 04/23/2015   Procedure: Coronary Stent Intervention;  Surgeon: Tonny BollmanMichael Cooper, MD;  [facility];  Service: Cardiovascular;  Laterality: N/A;  . CARDIAC CATHETERIZATION N/A 09/14/2015   Procedure: Left Heart Cath and Coronary  Angiography;  Surgeon: Lyn RecordsHenry W Smith, MD;  LAD 100%, CFX 10% ISR, RCA 100% (chronic), EF 25-35%  . CARDIAC CATHETERIZATION N/A 09/14/2015   Procedure: Coronary Stent Intervention;  Surgeon: Lyn RecordsHenry W Smith, MD;  [facility];  Service: Cardiovascular;  Laterality: N/A; 3.0 x 16 mm Synergy DES LAD  . CARDIAC CATHETERIZATION  03/16/2016  . CARDIAC CATHETERIZATION N/A 03/16/2016   Procedure: Left Heart Cath and Coronary Angiography;  Surgeon: Lyn RecordsHenry W Smith, MD;  [facility];  Service: Cardiovascular;  Laterality: N/A;  . CARDIAC CATHETERIZATION N/A 03/16/2016   Procedure: Coronary Balloon Angioplasty;  Surgeon: Lyn RecordsHenry W Smith, MD;  [facility];  Service: Cardiovascular;  Laterality: N/A;  . CARDIAC CATHETERIZATION N/A 03/25/2016   Procedure: Left Heart Cath and Coronary Angiography;  Surgeon: Peter M SwazilandJordan, MD;  [facility];  Service: Cardiovascular;  Laterality: N/A;  . CORONARY ANGIOPLASTY WITH STENT PLACEMENT  2010; 2013    Current Medications: Outpatient Medications Prior to Visit  Medication Sig Dispense Refill  . aspirin 81 MG EC tablet Take 1 tablet (81 mg total) by mouth daily. 30 tablet 11  . atorvastatin (LIPITOR) 80 MG tablet Take 1 tablet (80 mg total) by mouth daily at 6 PM. 90 tablet 3  . carvedilol (COREG) 6.25 MG tablet Take 1 tablet (6.25 mg total) by mouth 2 (two) times daily with a meal. 60 tablet 11  . fenofibrate (TRICOR) 48 MG tablet Take 1 tablet (48 mg total) by mouth daily. 30 tablet 11  . isosorbide mononitrate (IMDUR) 30 MG 24 hr tablet Take 1 tablet (30 mg total) by mouth daily. 30 tablet 6  . lisinopril (PRINIVIL,ZESTRIL) 5 MG tablet Take 1 tablet (5 mg total) by mouth daily. 90 tablet 3  . nitroGLYCERIN (NITROSTAT) 0.4 MG SL tablet Place 0.4 mg under the tongue every 5 (five) minutes as needed for chest pain (3 x).    . spironolactone (ALDACTONE) 25 MG tablet Take 1 tablet (25 mg total) by mouth daily. 30  tablet 6  . ticagrelor (BRILINTA) 90 MG TABS tablet Take 1 tablet (90 mg total) by mouth 2 (two) times daily. 60 tablet 11   No facility-administered medications prior to visit.      Allergies:   Review of patient's allergies indicates no known allergies.   Social History   Social History  . Marital status: Single    Spouse name: N/A  . Number of children: N/A  . 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 Smoking status: Former Smoker    Packs/day: 1.00    Years: 24.00    Types: Cigarettes  . Smokeless tobacco: Never Used  . Alcohol use No  . Drug use:     Types: Marijuana     Comment: 03/16/2016 "1-2 times/week, if that"  . Sexual activity: Yes   Other Topics Concern  . None   Social History Narrative   REGULAR EXERCISE  Family History:  The patient's family history includes Arrhythmia in his mother; Coronary artery disease in his mother; Coronary artery disease (age of onset: 2754) in his father; Heart attack in his brother, father, and mother; Heart disease in his father; Hypertension in his mother.     ROS:   Please see the history of present illness.    ROS All other systems reviewed and are negative.   PHYSICAL EXAM:   VS:  BP 126/80   Pulse 85   Ht 5\' 7"  (1.702 m)   Wt 267 lb 6.4 oz (121.3 kg)   BMI 41.88 kg/m    GEN: Well nourished, well developed, in no acute distress, obese HEENT: normal  Neck: no JVD, carotid bruits, or masses Cardiac: RRR; no murmurs, rubs, or gallops,no edema  Respiratory:  clear to auscultation bilaterally, normal work of breathing GI: soft, nontender, nondistended, + BS MS: no deformity or atrophy  Skin: warm and dry, no rash Neuro:  Alert and Oriented x 3, Strength and sensation are intact Psych: euthymic mood, full affect  Wt Readings from Last 3 Encounters:  03/30/16 267 lb 6.4 oz (121.3 kg)  03/26/16 262 lb 6.4 oz (119 kg)  03/17/16 266 lb 5.1 oz (120.8  kg)      Studies/Labs Reviewed:   EKG:  EKG is ordered today.  The ekg ordered today demonstrates NSR with evidence of anteroseptal infarct HR 84  Recent Labs: 09/14/2015: B Natriuretic Peptide 40.7 09/15/2015: ALT 36 03/17/2016: Magnesium 1.8 03/25/2016: BUN 15; Creatinine, Ser 1.10; Hemoglobin 14.1; Platelets 244; Potassium 3.8; Sodium 136   Lipid Panel    Component Value Date/Time   CHOL 215 (H) 09/14/2015 0823   TRIG 228 (H) 09/14/2015 0823   HDL 30 (L) 09/14/2015 0823   CHOLHDL 7.2 09/14/2015 0823   VLDL 46 (H) 09/14/2015 0823   LDLCALC 139 (H) 09/14/2015 16100823    Additional studies/ records that were reviewed today include:  2D Echocardiogram 8.11.2017 Study Conclusions - Left ventricle: The cavity size was normal. Wall thickness was increased in a pattern of mild LVH. Systolic function was moderately to severely reduced. The estimated ejection fraction was in the range of 30% to 35%. Diffuse hypokinesis. There is akinesis of the apical myocardium. Features are consistent with a pseudonormal left ventricular filling pattern, with concomitant abnormal relaxation and increased filling pressure (grade 2 diastolic dysfunction). Impressions: - Technically difficult; definity used; diffuse hypokinesis with apical akinesis; overall moderate to severe LV dysfunction; grade 2 diastolic dysfunction; indeterminant LV filling pressure. _____________  Cardiac Catheterization 8.11.2017  Coronary Findings  Dominance: Right  Left Anterior Descending  Prox LAD lesion, 0% stenosed. Prox LAD lesion with no stenosis was previously treated.  Prox LAD to Dist LAD lesion, 0% stenosed. Prox LAD to Dist LAD lesion with no stenosis was previously treated.  Dist LAD lesion, 25% stenosed.  First Diagonal Branch  Vessel is small in size.  Second Diagonal Branch  2nd Diag lesion, 45% stenosed. The lesion is dissected.  Left Circumflex  Prox Cx to Mid Cx lesion, 10%  stenosed. The lesion was previously treated.  Third Obtuse Marginal Branch  Vessel is small in size.  Right Coronary Artery  Dist RCA filled by collaterals from Prox RCA.  Prox RCA lesion, 100% stenosed.    Coronary Diagrams     _____________   ASSESSMENT & PLAN:   CAD: s/p multiple previous PCIs to LAD and LCx and known CTO of RCA. Recent NSTEMI with no culprit on cath and  no intervention- ? Vasospasm or transient recurrent thrombosis. Continued on medical therapy with ASA and Brilinta, BB and statin therapy. Imdur 30 mg daily added.   Ischemic CM: EF of 30-35% with diffuse hypokinesis and G2DD on most recent ECHO. His EF is slightly lower than in 09/2015 on appropriate medical therapy. Recent NSTEMI with no interventions. Will refer to EP for evaluation of ICD for primary prevention. Continue Coreg, lisinopril, and spiro. Appears euvolemic.    HTN: BP well controlled on current regimen   HLD: continue statin.   Obesity: diet and weight loss recommended. Body mass index is 41.88 kg/m.   Pre diabetes: HgA1c 6.2 in 08/2015. Continue f/u with PCP    Medication Adjustments/Labs and Tests Ordered: Current medicines are reviewed at length with the patient today.  Concerns regarding medicines are outlined above.  Medication changes, Labs and Tests ordered today are listed in the Patient Instructions below. Patient Instructions  Medication Instructions:  Your physician recommends that you continue on your current medications as directed. Please refer to the Current Medication list given to you today.   Labwork: None ordered  Testing/Procedures: None ordered  Follow-Up: Your physician recommends that you schedule a follow-up appointment in: SEE DR. NISHAN AS PLANNED  Any Other Special Instructions Will Be Listed Below (If Applicable).     If you need a refill on your cardiac medications before your next appointment, please call your pharmacy.  Byrd Hesselbach, PA-C  03/30/2016 3:19 PM    [facility] [address] [place], [place], [place]  [address] Phone: [phone]; Fax: [phone]

## 2016-03-30 ENCOUNTER — Ambulatory Visit (INDEPENDENT_AMBULATORY_CARE_PROVIDER_SITE_OTHER): Payer: Self-pay | Admitting: Physician Assistant

## 2016-03-30 ENCOUNTER — Encounter: Payer: Self-pay | Admitting: Physician Assistant

## 2016-03-30 VITALS — BP 126/80 | HR 85 | Ht 67.0 in | Wt 267.4 lb

## 2016-03-30 DIAGNOSIS — I110 Hypertensive heart disease with heart failure: Secondary | ICD-10-CM

## 2016-03-30 DIAGNOSIS — E785 Hyperlipidemia, unspecified: Secondary | ICD-10-CM

## 2016-03-30 DIAGNOSIS — I255 Ischemic cardiomyopathy: Secondary | ICD-10-CM

## 2016-03-30 DIAGNOSIS — I2510 Atherosclerotic heart disease of native coronary artery without angina pectoris: Secondary | ICD-10-CM

## 2016-03-30 DIAGNOSIS — R7303 Prediabetes: Secondary | ICD-10-CM

## 2016-03-30 DIAGNOSIS — I214 Non-ST elevation (NSTEMI) myocardial infarction: Secondary | ICD-10-CM

## 2016-03-30 NOTE — Patient Instructions (Addendum)
Medication Instructions:  Your physician recommends that you continue on your current medications as directed. Please refer to the Current Medication list given to you today.   Labwork: None ordered  Testing/Procedures: None ordered  Follow-Up: Your physician recommends that you schedule a follow-up appointment in: SEE ONE OF OUR EP SPECIALIST FOR POSSIBLE ICD  Your physician recommends that you schedule a follow-up appointment in: SEE DR. NISHAN AS PLANNED  Any Other Special Instructions Will Be Listed Below (If Applicable).     If you need a refill on your cardiac medications before your next appointment, please call your pharmacy.  .Marland Kitchen

## 2016-03-31 ENCOUNTER — Other Ambulatory Visit: Payer: Self-pay

## 2016-03-31 MED ORDER — TICAGRELOR 90 MG PO TABS
90.0000 mg | ORAL_TABLET | Freq: Two times a day (BID) | ORAL | 3 refills | Status: DC
Start: 2016-03-31 — End: 2016-10-21

## 2016-04-04 ENCOUNTER — Inpatient Hospital Stay: Payer: Self-pay | Admitting: Family Medicine

## 2016-04-04 ENCOUNTER — Telehealth: Payer: Self-pay

## 2016-04-04 NOTE — Telephone Encounter (Signed)
Call placed to the patient and confirmed his appointment for tomorrow, 04/05/16 @ 1030 with Dr [facility]. Instructed the patient to bring all of his medications with him to his appointment and he said that he would. Also reminded him that he has [facility] at 1130 after his appointment with Dr Venetia NightAmao. He informed this CM that need is supposed to get a letter from his mother stating that he is living with her but he is not able to get the letter because she is in the hospital.   This CM spoke to Fay Recordsiane Boyd, [facility] Financial Counselor, who stated that there are possibly other options to confirm the patient's residency and she encouraged that the patient keep his financial counseling appointment for tomorrow.   This CM then informed the patient of Ms Boyd's recommendation and he said that he would be at the appointments.  He also confirmed that he has transportation to the clinic.   No concerns/questions reported at this time.

## 2016-04-05 ENCOUNTER — Encounter: Payer: Self-pay | Admitting: Family Medicine

## 2016-04-05 ENCOUNTER — Ambulatory Visit: Payer: Medicaid Other

## 2016-04-05 ENCOUNTER — Ambulatory Visit: Payer: Medicaid Other | Attending: Family Medicine | Admitting: Family Medicine

## 2016-04-05 VITALS — BP 121/73 | HR 79 | Temp 98.4°F | Ht 67.0 in | Wt 264.4 lb

## 2016-04-05 DIAGNOSIS — F329 Major depressive disorder, single episode, unspecified: Secondary | ICD-10-CM

## 2016-04-05 DIAGNOSIS — E7800 Pure hypercholesterolemia, unspecified: Secondary | ICD-10-CM | POA: Diagnosis not present

## 2016-04-05 DIAGNOSIS — Z9889 Other specified postprocedural states: Secondary | ICD-10-CM | POA: Diagnosis not present

## 2016-04-05 DIAGNOSIS — I2510 Atherosclerotic heart disease of native coronary artery without angina pectoris: Secondary | ICD-10-CM

## 2016-04-05 DIAGNOSIS — R7303 Prediabetes: Secondary | ICD-10-CM | POA: Diagnosis not present

## 2016-04-05 DIAGNOSIS — F32A Depression, unspecified: Secondary | ICD-10-CM

## 2016-04-05 DIAGNOSIS — Z7982 Long term (current) use of aspirin: Secondary | ICD-10-CM | POA: Insufficient documentation

## 2016-04-05 DIAGNOSIS — I429 Cardiomyopathy, unspecified: Secondary | ICD-10-CM | POA: Insufficient documentation

## 2016-04-05 DIAGNOSIS — K219 Gastro-esophageal reflux disease without esophagitis: Secondary | ICD-10-CM | POA: Insufficient documentation

## 2016-04-05 DIAGNOSIS — I10 Essential (primary) hypertension: Secondary | ICD-10-CM | POA: Diagnosis present

## 2016-04-05 DIAGNOSIS — Z9861 Coronary angioplasty status: Secondary | ICD-10-CM

## 2016-04-05 DIAGNOSIS — I5042 Chronic combined systolic (congestive) and diastolic (congestive) heart failure: Secondary | ICD-10-CM | POA: Diagnosis not present

## 2016-04-05 DIAGNOSIS — Z79899 Other long term (current) drug therapy: Secondary | ICD-10-CM | POA: Insufficient documentation

## 2016-04-05 LAB — COMPLETE METABOLIC PANEL WITH GFR
ALT: 23 U/L (ref 9–46)
AST: 19 U/L (ref 10–40)
Albumin: 3.8 g/dL (ref 3.6–5.1)
Alkaline Phosphatase: 66 U/L (ref 40–115)
BUN: 12 mg/dL (ref 7–25)
CO2: 23 mmol/L (ref 20–31)
Calcium: 8.5 mg/dL — ABNORMAL LOW (ref 8.6–10.3)
Chloride: 104 mmol/L (ref 98–110)
Creat: 1.05 mg/dL (ref 0.60–1.35)
GFR, Est African American: >89 mL/min (ref >=60)
GFR, Est Non African American: 87 mL/min (ref >=60)
Glucose, Bld: 106 mg/dL — ABNORMAL HIGH (ref 65–99)
Potassium: 4.4 mmol/L (ref 3.5–5.3)
Sodium: 139 mmol/L (ref 135–146)
Total Bilirubin: 0.4 mg/dL (ref 0.2–1.2)
Total Protein: 7.1 g/dL (ref 6.1–8.1)

## 2016-04-05 LAB — LIPID PANEL
Cholesterol: 177 mg/dL (ref 125–200)
HDL: 29 mg/dL — ABNORMAL LOW (ref >=40)
LDL Cholesterol: 83 mg/dL (ref <130)
Total CHOL/HDL Ratio: 6.1 Ratio — ABNORMAL HIGH (ref <=5.0)
Triglycerides: 327 mg/dL — ABNORMAL HIGH (ref <150)
VLDL: 65 mg/dL — ABNORMAL HIGH (ref <30)

## 2016-04-05 NOTE — Progress Notes (Signed)
Transitional Care Clinic  Date of Telephone Encounter: 03/29/16  Hospitalization Dates : 03/24/16 - 03/26/16  Subjective:  Patient ID: Peter Russo, male    DOB: Aug 22, 1975  Age: 40 y.o. MRN: [id number]  CC: Hospitalization Follow-up (heart) and Hyperlipidemia   HPI Peter Russo is a 40 year old male with a history of CHF (EF 30-35%), coronary artery disease (s/p stent), prediabetes, hyperlipidemia, obesity who presents to establish care at transitional care clinic.  He was recently hospitalized for non-ST elevation MI found to have elevated troponin and cardiac cath performed revealed stable anatomy. 2-D echo from recent hospitalization revealed EF of 30-35%, LVH, moderately to severe reduced systolic function, grade 2 diastolic dysfunction. Spironolactone and isosorbide were added to his medication regimen; Zoloft was discontinued. He was scheduled with an outpatient follow-up with cardiology and EP.  Today he denies shortness of breath at rest but does have some shortness of breath with moderate activity. He informs me he feels nauseous and did become lightheaded; he woke up late and skipped his breakfast. Denies epigastric pain. He is requesting a letter to the Court stating his medical conditions as he is unable to keep a job and has to pay child support; he worked previously as a Naval architect. He informs me that Zoloft and lisinopril were discontinued however lisinopril is still reflected on his medication list.  He saw cardiology on 03/30/16 for follow-up visit and is up-to-date on all his medications He is depressed due to his current medical conditions and the fact that he has a positive history for cardiac disease - brothers and dad died of  cardiac conditions and his mom is currently hospitalized with cardiac issues.   Past Medical History:  Diagnosis Date  . Anxiety   . CAD S/P percutaneous coronary angioplasty    a. s/p BMS-mLAD 2010. b. DES to RCA 2012. c. 04/2015:  DES to LCx and PCI to LAD c/b dissection with subsequent overlapping DES to mLAD - r/i for NSTEMI afterwards; d. 08/2015 Ant STEMI in setting of noncompliance->134mLAD ISR (DES); e. 03/2016 PCI of apical LAD; f. 03/2016 Relook Cath: patent LAD stents, D1 25, D2 45, LCX 10p/m, RCA 100p CTO-->Med Rx.  . Chronic combined systolic and diastolic CHF (congestive heart failure) (HCC)    a. 03/2016 Echo: EF 30-35%, Gr2 DD  . Depression   . Essential hypertension   . GAD (generalized anxiety disorder)   . GERD (gastroesophageal reflux disease)   . Hypercholesteremia   . Ischemic cardiomyopathy 09/14/2015   a. prior EF 25-35 percent at cath, 35-40 percent by echo; b. 03/2016 Echo: EF 30-35%, diff HK, Gr2 DD.  . Morbid obesity (HCC)   . Pre-diabetes   . ST elevation (STEMI) myocardial infarction involving left anterior descending coronary artery (HCC) 09/14/2015   3.0 x 16 mm Synergy DES to the LAD for ISR BMS  . SVT (supraventricular tachycardia) (HCC) APRROX 10 YRS AGO   PRESUMED AVNRT, ECHO 12/07 WITH EF 65%, MILD LAE  . Tobacco abuse     Past Surgical History:  Procedure Laterality Date  . CARDIAC CATHETERIZATION N/A 04/23/2015   Procedure: Left Heart Cath and Coronary Angiography;  Surgeon: Wendall Stade, MD;  Location[facility] INV[facility] CV LAB;  Service: Cardiovascular;  Laterality: N/A;  . CARDIAC CATHETERIZATION N/A 04/23/2015   Procedure: Coronary Stent Intervention;  Surgeon: Tonny Bollman, MD;  [facility] INVAS[facility] CV LAB;  Service: Cardiovascular;  Laterality: N/A;  . CARDIAC CATHETERIZATION N/A 09/14/2015   Procedure: Left Heart Cath  and Coronary Angiography;  Surgeon: Lyn RecordsHenry W Smith, MD;  LAD 100%, CFX 10% ISR, RCA 100% (chronic), EF 25-35%  . CARDIAC CATHETERIZATION N/A 09/14/2015   Procedure: Coronary Stent Intervention;  Surgeon: Lyn RecordsHenry W Smith, MD;  [facility];  Service: Cardiovascular;  Laterality: N/A; 3.0 x 16 mm Synergy DES LAD  . CARDIAC CATHETERIZATION  03/16/2016  .  CARDIAC CATHETERIZATION N/A 03/16/2016   Procedure: Left Heart Cath and Coronary Angiography;  Surgeon: Lyn RecordsHenry W Smith, MD;  [facility];  Service: Cardiovascular;  Laterality: N/A;  . CARDIAC CATHETERIZATION N/A 03/16/2016   Procedure: Coronary Balloon Angioplasty;  Surgeon: Lyn RecordsHenry W Smith, MD;  [facility];  Service: Cardiovascular;  Laterality: N/A;  . CARDIAC CATHETERIZATION N/A 03/25/2016   Procedure: Left Heart Cath and Coronary Angiography;  Surgeon: Peter M SwazilandJordan, MD;  [facility];  Service: Cardiovascular;  Laterality: N/A;  . CORONARY ANGIOPLASTY WITH STENT PLACEMENT  2010; 2013    No Known Allergies   Outpatient Medications Prior to Visit  Medication Sig Dispense Refill  . aspirin 81 MG EC tablet Take 1 tablet (81 mg total) by mouth daily. 30 tablet 11  . atorvastatin (LIPITOR) 80 MG tablet Take 1 tablet (80 mg total) by mouth daily at 6 PM. 90 tablet 3  . carvedilol (COREG) 6.25 MG tablet Take 1 tablet (6.25 mg total) by mouth 2 (two) times daily with a meal. 60 tablet 11  . fenofibrate (TRICOR) 48 MG tablet Take 1 tablet (48 mg total) by mouth daily. 30 tablet 11  . isosorbide mononitrate (IMDUR) 30 MG 24 hr tablet Take 1 tablet (30 mg total) by mouth daily. 30 tablet 6  . nitroGLYCERIN (NITROSTAT) 0.4 MG SL tablet Place 0.4 mg under the tongue every 5 (five) minutes as needed for chest pain (3 x).    . spironolactone (ALDACTONE) 25 MG tablet Take 1 tablet (25 mg total) by mouth daily. 30 tablet 6  . ticagrelor (BRILINTA) 90 MG TABS tablet Take 1 tablet (90 mg total) by mouth 2 (two) times daily. 180 tablet 3  . lisinopril (PRINIVIL,ZESTRIL) 5 MG tablet Take 1 tablet (5 mg total) by mouth daily. (Patient not taking: Reported on 04/05/2016) 90 tablet 3  . sertraline (ZOLOFT) 50 MG tablet Take 50 mg by mouth daily as needed.  2   No facility-administered medications prior to visit.     ROS Review of Systems  Constitutional: Negative  for activity change and appetite change.  HENT: Negative for sinus pressure and sore throat.   Eyes: Negative for visual disturbance.  Respiratory: Negative for cough, chest tightness and shortness of breath.   Cardiovascular: Negative for chest pain and leg swelling.  Gastrointestinal: Positive for nausea. Negative for abdominal distention, abdominal pain, constipation and diarrhea.  Endocrine: Negative.   Genitourinary: Negative for dysuria.  Musculoskeletal: Negative for joint swelling and myalgias.  Skin: Negative for rash.  Allergic/Immunologic: Negative.   Neurological: Negative for weakness, light-headedness and numbness.  Psychiatric/Behavioral: Positive for dysphoric mood. Negative for suicidal ideas.    Objective:  BP 121/73 (BP Location: Right Arm, Patient Position: Sitting, Cuff Size: Large)   Pulse 79   Temp 98.4 F (36.9 C) (Oral)   Ht 5\' 7"  (1.702 m)   Wt 264 lb 6.4 oz (119.9 kg)   SpO2 96%   BMI 41.41 kg/m   BP/Weight 04/05/2016 03/30/2016 03/26/2016  Systolic BP 121 126 115  Diastolic BP 73 80 56  Wt. (Lbs) 264.4 267.4 262.4  BMI 41.41 41.88 -      Physical Exam  Constitutional: He is oriented to person, place, and time. He appears well-developed and well-nourished.  Obese  Cardiovascular: Normal rate, normal heart sounds and intact distal pulses.   No murmur heard. Pulmonary/Chest: Effort normal and breath sounds normal. He has no wheezes. He has no rales. He exhibits no tenderness.  Abdominal: Soft. Bowel sounds are normal. He exhibits no distension and no mass. There is no tenderness.  Musculoskeletal: Normal range of motion.  Neurological: He is alert and oriented to person, place, and time.     Assessment & Plan:   1. Essential hypertension Controlled His discharge medication list reveals he is supposed to be on lisinopril however the patient insists that he was instructed to discontinue this. I have advised him to discuss this with his  cardiologist for verification - Lipid panel - COMPLETE METABOLIC PANEL WITH GFR  2. Depression Zoloft was discontinued at discharge (? indication for discontinuation - ?risk of prolonged QT) May need another antidepressant  Current medical conditions, social situations and inability to work all factors exacerbating his symptoms. I have written a latter stating his medical conditions and inability to work as per the patient's request as he has child support payments to make.    3. Chronic combined systolic and diastolic CHF (congestive heart failure) (HCC) EF 30-35% No evidence of acute failure. He currently does not have a scale and I have advised him to request this at his next visit with cardiology Educated on maximum fluid intake of 2 L Scheduled for EP evaluation for possible ICD  4. CAD S/P percutaneous coronary angioplasty Status post recent cardiac cath  5. Pre-diabetes A1c 6.3 Diabetic diet Lifestyle modifications to prevent development of diabetes  6. Hypercholesteremia Uncontrolled Continue statin, low cholesterol diet Lipid panel today   No orders of the defined types were placed in this encounter.  This note has been created with Education officer, environmentalDragon speech recognition software and smart phrase technology. Any transcriptional errors are unintentional.    Follow-up: Return in about 2 weeks (around 04/19/2016), or if symptoms worsen or fail to improve, for TCC- follow up on Heart failure.   Jaclyn ShaggyEnobong Amao MD

## 2016-04-05 NOTE — Progress Notes (Signed)
Recently had some chest pain

## 2016-04-11 ENCOUNTER — Telehealth: Payer: Self-pay

## 2016-04-11 NOTE — Telephone Encounter (Signed)
Contacted pt to go over lab results pt is aware and doesn't have any questions or concerns 

## 2016-04-12 ENCOUNTER — Encounter (INDEPENDENT_AMBULATORY_CARE_PROVIDER_SITE_OTHER): Payer: Self-pay

## 2016-04-12 ENCOUNTER — Encounter: Payer: Self-pay | Admitting: Internal Medicine

## 2016-04-12 ENCOUNTER — Ambulatory Visit (INDEPENDENT_AMBULATORY_CARE_PROVIDER_SITE_OTHER): Payer: Self-pay | Admitting: Internal Medicine

## 2016-04-12 VITALS — BP 130/80 | HR 91 | Ht 67.0 in | Wt 264.4 lb

## 2016-04-12 DIAGNOSIS — I255 Ischemic cardiomyopathy: Secondary | ICD-10-CM

## 2016-04-12 DIAGNOSIS — I5042 Chronic combined systolic (congestive) and diastolic (congestive) heart failure: Secondary | ICD-10-CM

## 2016-04-12 MED ORDER — SACUBITRIL-VALSARTAN 24-26 MG PO TABS: 1.0000 | ORAL_TABLET | Freq: Two times a day (BID) | ORAL | 6 refills | Status: DC

## 2016-04-12 MED ORDER — SERTRALINE HCL 100 MG PO TABS: 100.0000 mg | ORAL_TABLET | Freq: Every day | ORAL | 0 refills | Status: DC

## 2016-04-12 MED ORDER — CARVEDILOL 12.5 MG PO TABS: 12.5000 mg | ORAL_TABLET | Freq: Two times a day (BID) | ORAL | 6 refills | Status: DC

## 2016-04-12 NOTE — Progress Notes (Signed)
Review of chart shows patient received results 04/11/16; no further action needed at this time.  Pollyann KennedyKim Becton, RN, BSN

## 2016-04-12 NOTE — Patient Instructions (Addendum)
Medication Instructions: - Your physician has recommended you make the following change in your medication:  1) Stop lisinopril (you must be off of this for 36 hours prior to starting entresto)  2) Increase coreg to 12.5 mg one tablet by mouth twice daily 3) Start entresto 24/26 mg one tablet by mouth twice daily 4) Re-start Zoloft 100 mg one tablet by mouth once daily ( Dr. Graciela HusbandsKlein will fill this for you 1 time- after that you will need to have your primary care doctor take care of this for you)  Labwork: - none  Procedures/Testing: - none  Follow-Up: - You have been referred to the CHF clinic- for medication titration  Any Additional Special Instructions Will Be Listed Below (If Applicable).     If you need a refill on your cardiac medications before your next appointment, please call your pharmacy.

## 2016-04-12 NOTE — Progress Notes (Signed)
ELECTROPHYSIOLOGY CONSULT NOTE  Patient ID: Peter Russo, MRN: [id number], DOB/AGE: February 21, 1976 40 y.o. Admit date: (Not on file) Date of Consult: 04/12/2016  Primary Physician: Jaclyn Shaggy, MD Primary Cardiologist: PN* Consulting Physician PN  Chief Complaint: ICD   HPI HASAN Russo is a 40 y.o. male  Referred for consideration of an ICD.  He has a history of coronary artery disease dating back to 2007 (age 55) at which time he presented with a anterior STEMI treated with stenting and complicated by in-stent restenosis. 8/17 he underwent repeat catheterization confirming known total RCA with left-to-right and right to right collaterals  with balloon only intervention to is distally occluded LAD.Marland Kitchen He reports presented with chest pain syndrome with a non-STEMI. Catheterization demonstrated no changes. Echocardiogram demonstrated ejection fraction 35%. He has been treated with guideline directed medical therapy including beta blockers Ace inhibitors and nitrates and Aldactone.  He is short of breath climbing a flight of stairs but is able to accomplish it. He does not have nocturnal dyspnea orthopnea or peripheral edema. He's had no tachypalpitations or syncope.    He has been unemployed first a number of years. He is depressed and tearful. He thinks often about dying.  Echo 8/17 EF 30-35%  Telemetry 8/17 nonsustained ventricular tachycardia  Past Medical History:  Diagnosis Date  . Anxiety   . CAD S/P percutaneous coronary angioplasty    a. s/p BMS-mLAD 2010. b. DES to RCA 2012. c. 04/2015: DES to LCx and PCI to LAD c/b dissection with subsequent overlapping DES to mLAD - r/i for NSTEMI afterwards; d. 08/2015 Ant STEMI in setting of noncompliance->126mLAD ISR (DES); e. 03/2016 PCI of apical LAD; f. 03/2016 Relook Cath: patent LAD stents, D1 25, D2 45, LCX 10p/m, RCA 100p CTO-->Med Rx.  . Chronic combined systolic and diastolic CHF (congestive heart failure) (HCC)    a.  03/2016 Echo: EF 30-35%, Gr2 DD  . Depression   . Essential hypertension   . GAD (generalized anxiety disorder)   . GERD (gastroesophageal reflux disease)   . Hypercholesteremia   . Ischemic cardiomyopathy 09/14/2015   a. prior EF 25-35 percent at cath, 35-40 percent by echo; b. 03/2016 Echo: EF 30-35%, diff HK, Gr2 DD.  . Morbid obesity (HCC)   . Pre-diabetes   . ST elevation (STEMI) myocardial infarction involving left anterior descending coronary artery (HCC) 09/14/2015   3.0 x 16 mm Synergy DES to the LAD for ISR BMS  . SVT (supraventricular tachycardia) (HCC) APRROX 10 YRS AGO   PRESUMED AVNRT, ECHO 12/07 WITH EF 65%, MILD LAE  . Tobacco abuse       Surgical History:  Past Surgical History:  Procedure Laterality Date  . CARDIAC CATHETERIZATION N/A 04/23/2015   Procedure: Left Heart Cath and Coronary Angiography;  Surgeon: Wendall Stade, MD;  Location: [facility] INVASIVE CV LAB;  Service: Cardiovascular;  Laterality: N/A;  . CARDIAC CATHETERIZATION N/A 04/23/2015   Procedure: Coronary Stent Intervention;  Surgeon: Tonny Bollman, MD;  Location: [facility] INVASIVE CV LAB;  Service: Cardiovascular;  Laterality: N/A;  . CARDIAC CATHETERIZATION N/A 09/14/2015   Procedure: Left Heart Cath and Coronary Angiography;  Surgeon: Lyn Records, MD;  LAD 100%, CFX 10% ISR, RCA 100% (chronic), EF 25-35%  . CARDIAC CATHETERIZATION N/A 09/14/2015   Procedure: Coronary Stent Intervention;  Surgeon: Lyn Records, MD;  Location: [facility] INVASIVE CV LAB;  Service: Cardiovascular;  Laterality: N/A; 3.0 x 16 mm Synergy DES LAD  . CARDIAC CATHETERIZATION  03/16/2016  . CARDIAC CATHETERIZATION N/A 03/16/2016   Procedure: Left Heart Cath and Coronary Angiography;  Surgeon: Lyn Records, MD;  Location: [facility] INVASIVE CV LAB;  Service: Cardiovascular;  Laterality: N/A;  . CARDIAC CATHETERIZATION N/A 03/16/2016   Procedure: Coronary Balloon Angioplasty;  Surgeon: Lyn Records, MD;  Location: [facility] INVASIVE CV LAB;  Service: Cardiovascular;   Laterality: N/A;  . CARDIAC CATHETERIZATION N/A 03/25/2016   Procedure: Left Heart Cath and Coronary Angiography;  Surgeon: Peter M Swaziland, MD;  Location: [facility] INVASIVE CV LAB;  Service: Cardiovascular;  Laterality: N/A;  . CORONARY ANGIOPLASTY WITH STENT PLACEMENT  2010; 2013     Home Meds: Prior to Admission medications   Medication Sig Start Date End Date Taking? Authorizing Provider  aspirin 81 MG EC tablet Take 1 tablet (81 mg total) by mouth daily. 03/17/16   Bhavinkumar Bhagat, PA  atorvastatin (LIPITOR) 80 MG tablet Take 1 tablet (80 mg total) by mouth daily at 6 PM. 09/11/15   Leone Brand, NP  carvedilol (COREG) 6.25 MG tablet Take 1 tablet (6.25 mg total) by mouth 2 (two) times daily with a meal. 03/17/16   Bhavinkumar Bhagat, PA  fenofibrate (TRICOR) 48 MG tablet Take 1 tablet (48 mg total) by mouth daily. 07/08/15   Dayna N Dunn, PA-C  isosorbide mononitrate (IMDUR) 30 MG 24 hr tablet Take 1 tablet (30 mg total) by mouth daily. 03/26/16   Ok Anis, NP  lisinopril (PRINIVIL,ZESTRIL) 5 MG tablet Take 1 tablet (5 mg total) by mouth daily. Patient not taking: Reported on 04/05/2016 09/22/15   Dyann Kief, PA-C  nitroGLYCERIN (NITROSTAT) 0.4 MG SL tablet Place 0.4 mg under the tongue every 5 (five) minutes as needed for chest pain (3 x).    Historical Provider, MD  sertraline (ZOLOFT) 50 MG tablet Take 50 mg by mouth daily as needed. 02/01/16   Historical Provider, MD  spironolactone (ALDACTONE) 25 MG tablet Take 1 tablet (25 mg total) by mouth daily. 03/26/16   Ok Anis, NP  ticagrelor (BRILINTA) 90 MG TABS tablet Take 1 tablet (90 mg total) by mouth 2 (two) times daily. 03/31/16   Jaclyn Shaggy, MD    Allergies: No Known Allergies  Social History   Social History  . Marital status: Single    Spouse name: N/A  . Number of children: N/A  . 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 Smoking status:  Former Smoker    Packs/day: 1.00    Years: 24.00    Types: Cigarettes  . Smokeless tobacco: Never Used     Comment: quit on 03/15/16  . Alcohol use No  . Drug use:     Types: Marijuana     Comment: last time 02/2016  . Sexual activity: Yes   Other Topics Concern  . Not on file   Social History Narrative   REGULAR EXERCISE     Family History  Problem Relation Age of Onset  . Arrhythmia Mother     MOTHER LIVED TO BE 102  . Coronary artery disease Mother   . Heart attack Mother   . Hypertension Mother   . Coronary artery disease Father 68  . Heart disease Father   . Heart attack Father   . Heart attack Brother   . Stroke Neg Hx      ROS:  Please see the history of present illness.  All other systems reviewed and negative.    Physical Exam  Blood pressure 130/80, pulse 91, height 5\' 7"  (1.702 m), weight 264 lb 6.4 oz (119.9 kg), SpO2 98 %. General: Well developed, well nourished male in no acute distress. Head: Normocephalic, atraumatic, sclera non-icteric, no xanthomas, nares are without discharge. EENT: normal  Lymph Nodes:  none Neck: Negative for carotid bruits. JVD 8 Back:without scoliosis kyphosis  Lungs: Clear bilaterally to auscultation without wheezes, rales, or rhonchi. Breathing is unlabored. Heart: RRR with S1 S2. No  * murmur . No rubs, or gallops appreciated. Abdomen: Soft, non-tender, non-distended with normoactive bowel sounds. No hepatomegaly. No rebound/guarding. No obvious abdominal masses. Msk:  Strength and tone appear normal for age. Extremities: No clubbing or cyanosis. 1+ edema.  Distal pedal pulses are 2+ and equal bilaterally. Skin: Warm and Dry Neuro: Alert and oriented X 3. CN III-XII intact Grossly normal sensory and motor function . Psych:  Responds to questions appropriately with a normal affect.      Labs: Cardiac Enzymes No results for input(s): CKTOTAL, CKMB, TROPONINI in the last 72 hours. CBC Lab Results  Component Value  Date   WBC 11.1 (H) 03/25/2016   HGB 14.1 03/25/2016   HCT 41.6 03/25/2016   MCV 86.5 03/25/2016   PLT 244 03/25/2016   PROTIME: No results for input(s): LABPROT, INR in the last 72 hours. Chemistry No results for input(s): NA, K, CL, CO2, BUN, CREATININE, CALCIUM, PROT, BILITOT, ALKPHOS, ALT, AST, GLUCOSE in the last 168 hours.  Invalid input(s): LABALBU Lipids Lab Results  Component Value Date   CHOL 177 04/05/2016   HDL 29 (L) 04/05/2016   LDLCALC 83 04/05/2016   TRIG 327 (H) 04/05/2016   BNP No results found for: PROBNP Thyroid Function Tests: No results for input(s): TSH, T4TOTAL, T3FREE, THYROIDAB in the last 72 hours.  Invalid input(s): FREET3 Miscellaneous No results found for: DDIMER  Radiology/Studies:  Dg Chest 2 View  Result Date: 03/24/2016 CLINICAL DATA:  Chest pain EXAM: CHEST  2 VIEW COMPARISON:  03/16/2016 FINDINGS: Cardiac shadow is within normal limits. Coronary stenting is again identified. The lungs are well aerated bilaterally. Minimal platelike atelectasis in the left mid lung is noted. No sizable effusion is seen. No bony abnormality is noted. IMPRESSION: Minimal platelike atelectasis in the left mid lung. Electronically Signed   By: Alcide Clever M.D.   On: 03/24/2016 09:06   Dg Chest Port 1 View  Result Date: 03/16/2016 CLINICAL DATA:  40 year old male with chest pain EXAM: PORTABLE CHEST 1 VIEW COMPARISON:  Chest radiograph dated 12/19/2014 FINDINGS: Single portable view of chest does not demonstrate a focal consolidation. There is no pleural effusion or pneumothorax. Stable top-normal cardiac silhouette. No acute osseous pathology. IMPRESSION: No active disease. Electronically Signed   By: Elgie Collard M.D.   On: 03/16/2016 06:43    EKG: *NSR 85 15/09/36 Septal MI  IMI   Assessment and Plan  Ischemic cardiomyopathy  Congestive heart failure systolic class II  Obesity  Depression     the patient has ischemic cardiomyopathy with  borderline ejection fraction w borderline Criteria for ICD implantation.  His medications are potentially modifiable with up titration of his beta blocker, transitioning his lisinopril to [place], and subsequently considering hydralazine added to his nitrates. This is probably most efficiently done in the heart failure clinic and we will refer him there. Today we increased his carvedilol from 6--12.5 and started him on Entresto 24/26.  As noted his ejection fraction  is borderline. It would be my hope that with up titration of his medications his ejection fraction would exceed indications for ICD implantation in this young man.  We also discussed the importance of addressing his depression  I have taken the liberty of resuming his sertraline and will increase 100 mg daily.       Sherryl MangesSteven Shraddha Lebron

## 2016-04-13 ENCOUNTER — Telehealth: Payer: Self-pay

## 2016-04-13 NOTE — Telephone Encounter (Signed)
This Case Manager placed call to patient to discuss scheduling follow-up appointment with Dr. Venetia NightAmao. Patient agreeable and appointment scheduled on 04/19/16 at 1115 with Dr. Venetia NightAmao. Patient had Electrophysiology consult on 04/12/16 with Dr. Graciela HusbandsKlein.  Carvedilol increased to 12.5 mg twice daily, sertraline 100 mg daily resumed, and entresto 24/26 mg started (1 tablet twice daily).  Inquired if patient had picked up medications, and patient indicated he had not yet picked up his new medications.   Spoke with [employer] CorporationDeyonna, Pharm[facility], who indicated carvedilol and sertraline ready for pick-up.  Script for Sherryll Burgerntresto was printed. Deyonna indicated samples of Entresto 24/26 mg available at pharmacy. She indicated patient will need to bring his script to pharmacy.  This Case Manager placed call to patient and updated him on all above information. Patient appreciative of information.  Reminded patient to limit his fluids to maximum of 2L/day. Patient verbalized understanding.  No additional needs/concerns identified at this time.

## 2016-04-15 ENCOUNTER — Telehealth: Payer: Self-pay

## 2016-04-15 NOTE — Telephone Encounter (Signed)
This Case Manager placed call to patient to remind him of upcoming Transitional Care follow-up appointment on 04/19/16 at 1115. Patient aware of appointment and indicated he had transportation to his appointment. Inquired if patient picked up carvedilol, sertraline, entresto from pharmacy. Patient indicated has carvedilol and entresto and plans to pick up sertraline when he comes for his appointment on 04/19/16. Discussed importance of medication compliance and patient verbalized understanding. Instructed patient to bring all of his medications to his upcoming appointment. Patient verbalized understanding.

## 2016-04-19 ENCOUNTER — Encounter: Payer: Self-pay | Admitting: Family Medicine

## 2016-04-19 ENCOUNTER — Ambulatory Visit: Payer: Medicaid Other | Attending: Family Medicine | Admitting: Family Medicine

## 2016-04-19 VITALS — BP 127/77 | HR 88 | Temp 98.1°F | Ht 67.0 in | Wt 266.2 lb

## 2016-04-19 DIAGNOSIS — K0889 Other specified disorders of teeth and supporting structures: Secondary | ICD-10-CM | POA: Diagnosis not present

## 2016-04-19 DIAGNOSIS — E7800 Pure hypercholesterolemia, unspecified: Secondary | ICD-10-CM

## 2016-04-19 DIAGNOSIS — K219 Gastro-esophageal reflux disease without esophagitis: Secondary | ICD-10-CM | POA: Insufficient documentation

## 2016-04-19 DIAGNOSIS — R7303 Prediabetes: Secondary | ICD-10-CM | POA: Diagnosis not present

## 2016-04-19 DIAGNOSIS — F411 Generalized anxiety disorder: Secondary | ICD-10-CM | POA: Diagnosis not present

## 2016-04-19 DIAGNOSIS — F329 Major depressive disorder, single episode, unspecified: Secondary | ICD-10-CM | POA: Diagnosis not present

## 2016-04-19 DIAGNOSIS — I10 Essential (primary) hypertension: Secondary | ICD-10-CM | POA: Diagnosis present

## 2016-04-19 DIAGNOSIS — Z7982 Long term (current) use of aspirin: Secondary | ICD-10-CM | POA: Diagnosis not present

## 2016-04-19 DIAGNOSIS — Z98811 Dental restoration status: Secondary | ICD-10-CM

## 2016-04-19 DIAGNOSIS — I2510 Atherosclerotic heart disease of native coronary artery without angina pectoris: Secondary | ICD-10-CM

## 2016-04-19 DIAGNOSIS — Z9889 Other specified postprocedural states: Secondary | ICD-10-CM | POA: Insufficient documentation

## 2016-04-19 DIAGNOSIS — F32A Depression, unspecified: Secondary | ICD-10-CM

## 2016-04-19 DIAGNOSIS — I5042 Chronic combined systolic (congestive) and diastolic (congestive) heart failure: Secondary | ICD-10-CM

## 2016-04-19 DIAGNOSIS — I255 Ischemic cardiomyopathy: Secondary | ICD-10-CM | POA: Diagnosis not present

## 2016-04-19 DIAGNOSIS — Z79899 Other long term (current) drug therapy: Secondary | ICD-10-CM | POA: Insufficient documentation

## 2016-04-19 DIAGNOSIS — Z9861 Coronary angioplasty status: Secondary | ICD-10-CM

## 2016-04-19 MED ORDER — SERTRALINE HCL 100 MG PO TABS: 100.0000 mg | ORAL_TABLET | Freq: Every day | ORAL | 3 refills | Status: DC

## 2016-04-19 MED FILL — ?SERTRALINE HCL 100 MG TAB: 100 | 30 days supply | Qty: 30 | Fill #0

## 2016-04-19 MED FILL — ENTRESTO 24 MG-26 MG TABLET: 24-26 | 30 days supply | Qty: 60 | Fill #0

## 2016-04-19 MED FILL — ?CARVEDILOL 12.5 MG TABLET: 12.5 | 30 days supply | Qty: 60 | Fill #0

## 2016-04-19 NOTE — Patient Instructions (Signed)

## 2016-04-19 NOTE — Progress Notes (Signed)
SOB- wakes him up at night Experiences on any type of exertion Sleep apnea? Cardiologist stopped lisinopril and increased the coreg Started entresto

## 2016-04-19 NOTE — Progress Notes (Signed)
Transitional Care Clinic  Date of Telephone Encounter: 03/29/16  Hospitalization Dates : 03/24/16 - 03/26/16   Subjective:    Patient ID: Peter Russo, male    DOB: 20-Oct-1975, 40 y.o.   MRN: [id number]  HPI Peter Russo is a 40 year old male with a history of CHF (EF 30-35%), coronary artery disease (s/p stent), prediabetes, hyperlipidemia, obesity who presents for a follow up visit at the transitional care clinic.  He was recently hospitalized for non-ST elevation MI found to have elevated troponin and cardiac cath performed revealed stable anatomy. 2-D echo from recent hospitalization revealed EF of 30-35%, LVH, moderately to severe reduced systolic function, grade 2 diastolic dysfunction.  At his last visit with cardiology he was commenced on Entresto and Zoloft was resumed.   He continues to have shortness of breath with mild to moderate exertion  He saw cardiology on 03/30/16 for follow-up visit and is up-to-date on all his medications He is depressed due to his current medical conditions and the fact that he has a positive history for cardiac disease - brothers and dad died of  cardiac conditions and his mom is currently hospitalized with cardiac issues; working on obtaining disability.  Would like a Dental referral as he lost the filling from one of his tooth.   Past Medical History:  Diagnosis Date  . Anxiety   . CAD S/P percutaneous coronary angioplasty    a. s/p BMS-mLAD 2010. b. DES to RCA 2012. c. 04/2015: DES to LCx and PCI to LAD c/b dissection with subsequent overlapping DES to mLAD - r/i for NSTEMI afterwards; d. 08/2015 Ant STEMI in setting of noncompliance->160mLAD ISR (DES); e. 03/2016 PCI of apical LAD; f. 03/2016 Relook Cath: patent LAD stents, D1 25, D2 45, LCX 10p/m, RCA 100p CTO-->Med Rx.  . Chronic combined systolic and diastolic CHF (congestive heart failure) (HCC)    a. 03/2016 Echo: EF 30-35%, Gr2 DD  . Depression   . Essential hypertension   . GAD  (generalized anxiety disorder)   . GERD (gastroesophageal reflux disease)   . Hypercholesteremia   . Ischemic cardiomyopathy 09/14/2015   a. prior EF 25-35 percent at cath, 35-40 percent by echo; b. 03/2016 Echo: EF 30-35%, diff HK, Gr2 DD.  . Morbid obesity (HCC)   . Pre-diabetes   . ST elevation (STEMI) myocardial infarction involving left anterior descending coronary artery (HCC) 09/14/2015   3.0 x 16 mm Synergy DES to the LAD for ISR BMS  . SVT (supraventricular tachycardia) (HCC) APRROX 10 YRS AGO   PRESUMED AVNRT, ECHO 12/07 WITH EF 65%, MILD LAE  . Tobacco abuse     Past Surgical History:  Procedure Laterality Date  . CARDIAC CATHETERIZATION N/A 04/23/2015   Procedure: Left Heart Cath and Coronary Angiography;  Surgeon: Wendall Stade, MD;  Location: [facility] INVASIVE CV LAB;  Service: Cardiovascular;  Laterality: N/A;  . CARDIAC CATHETERIZATION N/A 04/23/2015   Procedure: Coronary Stent Intervention;  Surgeon: Tonny Bollman, MD;  Location: [facility] INVASIVE CV LAB;  Service: Cardiovascular;  Laterality: N/A;  . CARDIAC CATHETERIZATION N/A 09/14/2015   Procedure: Left Heart Cath and Coronary Angiography;  Surgeon: Lyn Records, MD;  LAD 100%, CFX 10% ISR, RCA 100% (chronic), EF 25-35%  . CARDIAC CATHETERIZATION N/A 09/14/2015   Procedure: Coronary Stent Intervention;  Surgeon: Lyn Records, MD;  Location: [facility] INVASIVE CV LAB;  Service: Cardiovascular;  Laterality: N/A; 3.0 x 16 mm Synergy DES LAD  . CARDIAC CATHETERIZATION  03/16/2016  . CARDIAC  CATHETERIZATION N/A 03/16/2016   Procedure: Left Heart Cath and Coronary Angiography;  Surgeon: Lyn RecordsHenry W Smith, MD;  [facility];  Service: Cardiovascular;  Laterality: N/A;  . CARDIAC CATHETERIZATION N/A 03/16/2016   Procedure: Coronary Balloon Angioplasty;  Surgeon: Lyn RecordsHenry W Smith, MD;  [facility];  Service: Cardiovascular;  Laterality: N/A;  . CARDIAC CATHETERIZATION N/A 03/25/2016   Procedure: Left Heart Cath and Coronary  Angiography;  Surgeon: Peter M SwazilandJordan, MD;  [facility];  Service: Cardiovascular;  Laterality: N/A;  . CORONARY ANGIOPLASTY WITH STENT PLACEMENT  2010; 2013    No Known Allergies  Current Outpatient Prescriptions on File Prior to Visit  Medication Sig Dispense Refill  . aspirin 81 MG EC tablet Take 1 tablet (81 mg total) by mouth daily. 30 tablet 11  . atorvastatin (LIPITOR) 80 MG tablet Take 1 tablet (80 mg total) by mouth daily at 6 PM. 90 tablet 3  . carvedilol (COREG) 12.5 MG tablet Take 1 tablet (12.5 mg total) by mouth 2 (two) times daily. 60 tablet 6  . fenofibrate (TRICOR) 48 MG tablet Take 1 tablet (48 mg total) by mouth daily. 30 tablet 11  . isosorbide mononitrate (IMDUR) 30 MG 24 hr tablet Take 1 tablet (30 mg total) by mouth daily. 30 tablet 6  . nitroGLYCERIN (NITROSTAT) 0.4 MG SL tablet Place 0.4 mg under the tongue every 5 (five) minutes as needed for chest pain (3 x).    . sacubitril-valsartan (ENTRESTO) 24-26 MG Take 1 tablet by mouth 2 (two) times daily. 60 tablet 6  . sertraline (ZOLOFT) 100 MG tablet Take 1 tablet (100 mg total) by mouth daily. 30 tablet 0  . spironolactone (ALDACTONE) 25 MG tablet Take 1 tablet (25 mg total) by mouth daily. 30 tablet 6  . ticagrelor (BRILINTA) 90 MG TABS tablet Take 1 tablet (90 mg total) by mouth 2 (two) times daily. 180 tablet 3   No current facility-administered medications on file prior to visit.     Review of Systems Constitutional: Negative for activity change and appetite change.  HENT: Negative for sinus pressure and sore throat.   Eyes: Negative for visual disturbance.  Respiratory: Negative for cough, chest tightness and shortness of breath.   Cardiovascular: Negative for chest pain and leg swelling.  Gastrointestinal:  Negative for abdominal distention, abdominal pain, constipation and diarrhea.  Endocrine: Negative.   Genitourinary: Negative for dysuria.  Musculoskeletal: Negative for joint swelling  and myalgias.  Skin: Negative for rash.  Allergic/Immunologic: Negative.   Neurological: Negative for weakness, light-headedness and numbness.  Psychiatric/Behavioral: Negative for dysphoric mood, suicidal ideas.     Objective: Vitals:   04/19/16 1121  BP: 127/77  Pulse: 88  Temp: 98.1 F (36.7 C)  TempSrc: Oral  SpO2: 95%  Weight: 266 lb 3.2 oz (120.7 kg)  Height: 5\' 7"  (1.702 m)      Physical Exam  Constitutional: He is oriented to person, place, and time. He appears well-developed and well-nourished.  Obese  Cardiovascular: Normal rate, normal heart sounds and intact distal pulses.   No murmur heard. Pulmonary/Chest: Effort normal and breath sounds normal. He has no wheezes. He has no rales. He exhibits no tenderness.  Abdominal: Soft. Bowel sounds are normal. He exhibits no distension and no mass. There is no tenderness.  Musculoskeletal: Normal range of motion.  Neurological: He is alert and oriented to person, place, and time.      Wt Readings from Last 3 Encounters:  04/19/16 266 lb 3.2 oz (120.7 kg)  04/12/16 264 lb 6.4 oz (119.9 kg)  04/05/16 264 lb 6.4 oz (119.9 kg)       Lipid Panel     Component Value Date/Time   CHOL 177 04/05/2016 1144   TRIG 327 (H) 04/05/2016 1144   HDL 29 (L) 04/05/2016 1144   CHOLHDL 6.1 (H) 04/05/2016 1144   VLDL 65 (H) 04/05/2016 1144   LDLCALC 83 04/05/2016 1144    Assessment & Plan:  1. Essential hypertension Controlled  2. Depression Currently on Zoloft    3. Chronic combined systolic and diastolic CHF (congestive heart failure) (HCC) EF 30-35% Euvolemic Continue Entresto Educated on maximum fluid intake of 2 L Scheduled for EP evaluation for possible ICD  4. CAD S/P percutaneous coronary angioplasty Status post recent cardiac cath - Stable anatomy  5. Pre-diabetes A1c 6.3 Diabetic diet Lifestyle modifications to prevent development of diabetes  6. Hypercholesteremia LDL is slightly above goal of less  than 70 Also has hypertriglyceridemia Continue statin, low cholesterol diet  Referred to Dentistry due to loss of tooth filling      This note has been created with Education officer, environmental. Any transcriptional errors are unintentional.

## 2016-04-22 NOTE — Progress Notes (Signed)
Patient ID: Peter Russo, male   DOB: 28-Aug-1975, 40 y.o.   MRN: [id number] Cardiology Office Note   Date:  04/25/2016   ID:  Peter Russo, DOB Nov 13, 1975, MRN [id number]  PCP:  Jaclyn Shaggy, MD  Cardiologist:   Dr. Eden Emms  Chief Complaint: post hospital follow-up    History of Present Illness: Peter Russo is a 40 y.o. male who presents for  Post hospital follow-up. Patient was admitted  09/14/15 with a STEMI due to occlusion of the mid LAD within a previously placed BMS. He underwent successful PTCA and restenting of the mid LAD. The distal LAD previously stented was widely patent , circumflex stent widely patent , RCA chronically occluded fills by left to right collaterals, LV systolic dysfunction EF 25-35%. Patient had been noncompliant with antiplatelet therapy because of financial issues. EF was 35-40% by 2-D echo with grade 2 diastolic dysfunction.  Was supposed to have an MRI in April to risk stratify for AICD but did not get it Started on lasix in January for PND at night   Seen by [facility] 04/12/16 and started on entresto with up titration of beta blocker  Also started on sertraline for depression    Past Medical History:  Diagnosis Date  . Anxiety   . CAD S/P percutaneous coronary angioplasty    a. s/p BMS-mLAD 2010. b. DES to RCA 2012. c. 04/2015: DES to LCx and PCI to LAD c/b dissection with subsequent overlapping DES to mLAD - r/i for NSTEMI afterwards; d. 08/2015 Ant STEMI in setting of noncompliance->120mLAD ISR (DES); e. 03/2016 PCI of apical LAD; f. 03/2016 Relook Cath: patent LAD stents, D1 25, D2 45, LCX 10p/m, RCA 100p CTO-->Med Rx.  . Chronic combined systolic and diastolic CHF (congestive heart failure) (HCC)    a. 03/2016 Echo: EF 30-35%, Gr2 DD  . Depression   . Essential hypertension   . GAD (generalized anxiety disorder)   . GERD (gastroesophageal reflux disease)   . Hypercholesteremia   . Ischemic cardiomyopathy 09/14/2015   a. prior EF 25-35 percent at  cath, 35-40 percent by echo; b. 03/2016 Echo: EF 30-35%, diff HK, Gr2 DD.  . Morbid obesity (HCC)   . Pre-diabetes   . ST elevation (STEMI) myocardial infarction involving left anterior descending coronary artery (HCC) 09/14/2015   3.0 x 16 mm Synergy DES to the LAD for ISR BMS  . SVT (supraventricular tachycardia) (HCC) APRROX 10 YRS AGO   PRESUMED AVNRT, ECHO 12/07 WITH EF 65%, MILD LAE  . Tobacco abuse     Past Surgical History:  Procedure Laterality Date  . CARDIAC CATHETERIZATION N/A 04/23/2015   Procedure: Left Heart Cath and Coronary Angiography;  Surgeon: Wendall Stade, MD;  Location: [facility] INVASIVE CV LAB;  Service: Cardiovascular;  Laterality: N/A;  . CARDIAC CATHETERIZATION N/A 04/23/2015   Procedure: Coronary Stent Intervention;  Surgeon: Tonny Bollman, MD;  Location: [facility] INVASIVE CV LAB;  Service: Cardiovascular;  Laterality: N/A;  . CARDIAC CATHETERIZATION N/A 09/14/2015   Procedure: Left Heart Cath and Coronary Angiography;  Surgeon: Lyn Records, MD;  LAD 100%, CFX 10% ISR, RCA 100% (chronic), EF 25-35%  . CARDIAC CATHETERIZATION N/A 09/14/2015   Procedure: Coronary Stent Intervention;  Surgeon: Lyn Records, MD;  Location: [facility] INVASIVE CV LAB;  Service: Cardiovascular;  Laterality: N/A; 3.0 x 16 mm Synergy DES LAD  . CARDIAC CATHETERIZATION  03/16/2016  . CARDIAC CATHETERIZATION N/A 03/16/2016   Procedure: Left Heart Cath and Coronary Angiography;  Surgeon: Sherilyn Cooter  Malissa Hippo, MD;  Location: [facility] INVASIVE CV LAB;  Service: Cardiovascular;  Laterality: N/A;  . CARDIAC CATHETERIZATION N/A 03/16/2016   Procedure: Coronary Balloon Angioplasty;  Surgeon: Lyn Records, MD;  Location: [facility] INVASIVE CV LAB;  Service: Cardiovascular;  Laterality: N/A;  . CARDIAC CATHETERIZATION N/A 03/25/2016   Procedure: Left Heart Cath and Coronary Angiography;  Surgeon: Elayah Klooster M Swaziland, MD;  Location: [facility] INVASIVE CV LAB;  Service: Cardiovascular;  Laterality: N/A;  . CORONARY ANGIOPLASTY WITH STENT PLACEMENT  2010;  2013     Current Outpatient Prescriptions  Medication Sig Dispense Refill  . aspirin 81 MG EC tablet Take 1 tablet (81 mg total) by mouth daily. 30 tablet 11  . atorvastatin (LIPITOR) 80 MG tablet Take 1 tablet (80 mg total) by mouth daily at 6 PM. 90 tablet 3  . carvedilol (COREG) 12.5 MG tablet Take 1 tablet (12.5 mg total) by mouth 2 (two) times daily. 60 tablet 6  . fenofibrate (TRICOR) 48 MG tablet Take 1 tablet (48 mg total) by mouth daily. 30 tablet 11  . isosorbide mononitrate (IMDUR) 30 MG 24 hr tablet Take 1 tablet (30 mg total) by mouth daily. 30 tablet 6  . nitroGLYCERIN (NITROSTAT) 0.4 MG SL tablet Place 0.4 mg under the tongue every 5 (five) minutes as needed for chest pain (3 x).    . sacubitril-valsartan (ENTRESTO) 24-26 MG Take 1 tablet by mouth 2 (two) times daily. 60 tablet 6  . sertraline (ZOLOFT) 100 MG tablet Take 1 tablet (100 mg total) by mouth daily. 30 tablet 3  . spironolactone (ALDACTONE) 25 MG tablet Take 1 tablet (25 mg total) by mouth daily. 30 tablet 6  . ticagrelor (BRILINTA) 90 MG TABS tablet Take 1 tablet (90 mg total) by mouth 2 (two) times daily. 180 tablet 3   No current facility-administered medications for this visit.     Allergies:   Review of patient's allergies indicates no known allergies.    Social History:  The patient  reports that he has quit smoking. His smoking use included Cigarettes. He has a 24.00 pack-year smoking history. He has never used smokeless tobacco. He reports that he uses drugs, including Marijuana. He reports that he does not drink alcohol.   Family History:  The patient's    family history includes Arrhythmia in his mother; Coronary artery disease in his mother; Coronary artery disease (age of onset: 23) in his father; Heart attack in his brother, father, and mother; Heart disease in his father; Hypertension in his mother.    ROS:  Please see the history of present illness.   Otherwise, review of systems are positive for  none.   All other systems are reviewed and negative.    PHYSICAL EXAM: VS:  BP 130/70   Pulse (!) 59   Ht 5\' 7"  (1.702 m)   Wt 121.1 kg (267 lb)   SpO2 98%   BMI 41.82 kg/m  , BMI Body mass index is 41.82 kg/m. GEN:  Obese, in no acute distress Neck: no JVD, HJR, carotid bruits, or masses Cardiac: RRR; no murmurs,gallop, rubs, thrill or heave,  Respiratory:  clear to auscultation bilaterally, normal work of breathing GI: soft, nontender, nondistended, + BS MS: no deformity or atrophy Extremities: right groin at cath sitewithout hematoma or hemorrhage good distal pulses otherwise lower extremities without cyanosis, clubbing, edema, good distal pulses bilaterally.  Skin: warm and dry, no rash Neuro:  Strength and sensation are intact    EKG:  10/02/15   Normal sinus rhythm with recent anterior MI   Recent Labs: 09/14/2015: B Natriuretic Peptide 40.7 03/17/2016: Magnesium 1.8 03/25/2016: Hemoglobin 14.1; Platelets 244 04/05/2016: ALT 23; BUN 12; Creat 1.05; Potassium 4.4; Sodium 139    Lipid Panel    Component Value Date/Time   CHOL 177 04/05/2016 1144   TRIG 327 (H) 04/05/2016 1144   HDL 29 (L) 04/05/2016 1144   CHOLHDL 6.1 (H) 04/05/2016 1144   VLDL 65 (H) 04/05/2016 1144   LDLCALC 83 04/05/2016 1144      Wt Readings from Last 3 Encounters:  04/25/16 121.1 kg (267 lb)  04/19/16 120.7 kg (266 lb 3.2 oz)  04/12/16 119.9 kg (264 lb 6.4 oz)      Other studies Reviewed: Additional studies/ records that were reviewed today include and review of the records demonstrates:   Echo: 09/17/2015 - Left ventricle: The cavity size was mildly dilated. Wall   thickness was normal. Systolic function was moderately reduced.   The estimated ejection fraction was in the range of 35% to 40%.   There is akinesis of the apical myocardium. Features are   consistent with a pseudonormal left ventricular filling pattern,   with concomitant abnormal relaxation and increased filling    pressure (grade 2 diastolic dysfunction). Impressions: - Technically difficult; definity used; apical akinesis with   overall moderately reduced LV function (EF 40); grade 2 diastolic   dysfunction; trace MR and TR.  Cardiac Cath: 01/302017 1. Prox Cx to Mid Cx lesion, 10% stenosed. The lesion was previously treated with a stent (unknown type). 2. Prox LAD lesion, 100% stenosed. 3. Prox RCA lesion, 100% stenosed. 4. Prox LAD to Dist LAD lesion, 100% stenosed. Post intervention, there is a 0% residual stenosis. The lesion was previously treated with a stent (unknown type).   Acute anterior myocardial infarction due to occlusion of the mid LAD within a previously placed bare metal stent. The feel based upon device feedback was that there was restenosis with ultimate acute occlusion within the stent.  Successful PTCA and restenting of the mid LAD from 100% to 0% with TIMI grade 3 flow, with Angiosculpt scoring balloon and 3.0 x 16 mm Synergy DES. Further mid and distal LAD previously stented in September were widely patent.  Circumflex stent placed in September is widely patent.  Right coronary is chronically occluded and fills by left to right collaterals  Left ventricular systolic dysfunction EF 25-35% RECOMMENDATIONS:    Long-term dual antiplatelet therapy.    Will need social services help .  Heart failure therapy to include beta blocker and ACE inhibitor therapyy  Watch for CHF  Will need 4-5 days in hospital before discharge to workout social issues and adjust medications    ASSESSMENT AND PLAN:  CAD S/P percutaneous coronary angioplasty Patient had a STEMI after stopping his antiplatelet because of financial reasons. He underwent Successful PTCA restenting of the mid LAD with Angio-Seal skull scoring balloon and Synergy DES. Further mid and distal LAD previously stented were patent , circumflex stent was patent , RCA was chronically occluded EF 25-30% at cath. Continue DAT    Essential hypertension Blood pressure stable. Patient is out of lisinopril but he is going to community health to refill it today.  Tobacco abuse Patient quit smoking and is commended for this.  Morbid obesity (HCC) Exercise and weight loss program recommended  Cardiomyopathy, ischemic Patient's EF is 25-35% at cath 35-40% on 2-D echo. No evidence of heart failure and exam. He also  has grade 2 diastolic dysfunction. Continue 2 g sodium diet and daily weights  Insurance and compliance has been And issue Ideally would have f/u cardiac MRI to quantitate EF   Samples of entresto / brillinta given F/u echo in November and then see EP/ CHF make decision about AICD   F/U me 6 months     Charlton Haws

## 2016-04-25 ENCOUNTER — Ambulatory Visit (INDEPENDENT_AMBULATORY_CARE_PROVIDER_SITE_OTHER): Payer: Self-pay | Admitting: Cardiovascular Disease

## 2016-04-25 ENCOUNTER — Encounter: Payer: Self-pay | Admitting: Cardiovascular Disease

## 2016-04-25 ENCOUNTER — Encounter (INDEPENDENT_AMBULATORY_CARE_PROVIDER_SITE_OTHER): Payer: Self-pay

## 2016-04-25 VITALS — BP 130/70 | HR 59 | Ht 67.0 in | Wt 267.0 lb

## 2016-04-25 DIAGNOSIS — I2510 Atherosclerotic heart disease of native coronary artery without angina pectoris: Secondary | ICD-10-CM

## 2016-04-25 NOTE — Patient Instructions (Addendum)
Medication Instructions:  Your physician recommends that you continue on your current medications as directed. Please refer to the Current Medication list given to you today.  Labwork: NONE  Testing/Procedures: Your physician has requested that you have an echocardiogram. Echocardiography is a painless test that uses sound waves to create images of your heart. It provides your doctor with information about the size and shape of your heart and how well your heart's chambers and valves are working. This procedure takes approximately one hour. There are no restrictions for this procedure.  Follow-Up: Your physician wants you to follow-up in: 6 months with Dr. Eden EmmsNishan. You will receive a reminder letter in the mail two months in advance. If you don't receive a letter, please call our office to schedule the follow-up appointment.  Your physician recommends that you schedule a follow-up appointment in: December with [facility].  Your physician recommends that you schedule a follow-up appointment in: December with an EP doctor.  If you need a refill on your cardiac medications before your next appointment, please call your pharmacy.

## 2016-05-17 ENCOUNTER — Inpatient Hospital Stay (HOSPITAL_COMMUNITY): Admission: RE | Admit: 2016-05-17 | Payer: Self-pay | Source: Ambulatory Visit

## 2016-06-12 ENCOUNTER — Encounter (HOSPITAL_COMMUNITY): Payer: Self-pay | Admitting: Emergency Medicine

## 2016-06-12 ENCOUNTER — Emergency Department (HOSPITAL_COMMUNITY)
Admission: EM | Admit: 2016-06-12 | Discharge: 2016-06-12 | Disposition: A | Discharge status: Home / Self Care | Payer: Medicaid Other | Attending: Emergency Medicine | Admitting: Emergency Medicine

## 2016-06-12 DIAGNOSIS — K0889 Other specified disorders of teeth and supporting structures: Secondary | ICD-10-CM | POA: Diagnosis present

## 2016-06-12 DIAGNOSIS — K047 Periapical abscess without sinus: Secondary | ICD-10-CM

## 2016-06-12 DIAGNOSIS — Z79899 Other long term (current) drug therapy: Secondary | ICD-10-CM | POA: Insufficient documentation

## 2016-06-12 DIAGNOSIS — Z7982 Long term (current) use of aspirin: Secondary | ICD-10-CM | POA: Insufficient documentation

## 2016-06-12 DIAGNOSIS — Z87891 Personal history of nicotine dependence: Secondary | ICD-10-CM | POA: Diagnosis not present

## 2016-06-12 DIAGNOSIS — K029 Dental caries, unspecified: Secondary | ICD-10-CM | POA: Insufficient documentation

## 2016-06-12 DIAGNOSIS — Z955 Presence of coronary angioplasty implant and graft: Secondary | ICD-10-CM | POA: Insufficient documentation

## 2016-06-12 DIAGNOSIS — I2510 Atherosclerotic heart disease of native coronary artery without angina pectoris: Secondary | ICD-10-CM | POA: Insufficient documentation

## 2016-06-12 DIAGNOSIS — I5042 Chronic combined systolic (congestive) and diastolic (congestive) heart failure: Secondary | ICD-10-CM | POA: Diagnosis not present

## 2016-06-12 DIAGNOSIS — I252 Old myocardial infarction: Secondary | ICD-10-CM | POA: Insufficient documentation

## 2016-06-12 DIAGNOSIS — I110 Hypertensive heart disease with heart failure: Secondary | ICD-10-CM | POA: Insufficient documentation

## 2016-06-12 MED ORDER — AMOXICILLIN 500 MG PO CAPS
500.0000 mg | ORAL_CAPSULE | Freq: Once | ORAL | Status: AC
  Administered 2016-06-12: 500 mg via ORAL
  Filled 2016-06-12: qty 1

## 2016-06-12 MED ORDER — HYDROCODONE-ACETAMINOPHEN 5-325 MG PO TABS: 1.0000 | ORAL_TABLET | Freq: Four times a day (QID) | ORAL | 0 refills | Status: DC | PRN

## 2016-06-12 MED ORDER — AMOXICILLIN 500 MG PO CAPS: 500.0000 mg | ORAL_CAPSULE | Freq: Three times a day (TID) | ORAL | 0 refills | Status: DC

## 2016-06-12 NOTE — ED Triage Notes (Signed)
Pt reports dental pain upper R jaw in back, states had a filling back there that fell out "and it's been a minute" but today started experiencing pain. States didn't take anything PTA. States tried OTC filling to replace lost filling but not effective.

## 2016-06-12 NOTE — Discharge Instructions (Signed)
Do not take the narcotic pain medication if driving because it will make you sleepy.

## 2016-06-12 NOTE — ED Provider Notes (Signed)
[facility]-EMERGENCY DEPT Provider Note   CSN: [id number] Arrival date & time: 06/12/16  2009   By signing my name below, I, Clarisse GougeXavier Herndon, attest that this documentation has been prepared under the direction and in the presence of Kerrie BuffaloHope Neese, NP. Electronically Signed: Clarisse GougeXavier Herndon, Scribe. 06/12/16. 10:18 PM.   History   Chief Complaint Chief Complaint  Patient presents with  . Dental Pain    HPI Peter Russo is a 40 y.o. male.  The history is provided by the patient. No language interpreter was used.   HPI Comments: Peter Russo is a 40 y.o. male with PMHx of CAD who presents to the Emergency Department complaining of sudden onset, constant, severe upper right first molar pain. Pt notes associated headache and chills. Pt notes a temporary fill in applied to the affected tooth. He denies fever and ear pain. No alleviating factors noted.   Past Medical History:  Diagnosis Date  . Anxiety   . CAD S/P percutaneous coronary angioplasty    a. s/p BMS-mLAD 2010. b. DES to RCA 2012. c. 04/2015: DES to LCx and PCI to LAD c/b dissection with subsequent overlapping DES to mLAD - r/i for NSTEMI afterwards; d. 08/2015 Ant STEMI in setting of noncompliance->15100mLAD ISR (DES); e. 03/2016 PCI of apical LAD; f. 03/2016 Relook Cath: patent LAD stents, D1 25, D2 45, LCX 10p/m, RCA 100p CTO-->Med Rx.  . Chronic combined systolic and diastolic CHF (congestive heart failure) (HCC)    a. 03/2016 Echo: EF 30-35%, Gr2 DD  . Depression   . Essential hypertension   . GAD (generalized anxiety disorder)   . GERD (gastroesophageal reflux disease)   . Hypercholesteremia   . Ischemic cardiomyopathy 09/14/2015   a. prior EF 25-35 percent at cath, 35-40 percent by echo; b. 03/2016 Echo: EF 30-35%, diff HK, Gr2 DD.  . Morbid obesity (HCC)   . Pre-diabetes   . ST elevation (STEMI) myocardial infarction involving left anterior descending coronary artery (HCC) 09/14/2015   3.0 x 16 mm Synergy DES to the LAD  for ISR BMS  . SVT (supraventricular tachycardia) (HCC) APRROX 10 YRS AGO   PRESUMED AVNRT, ECHO 12/07 WITH EF 65%, MILD LAE  . Tobacco abuse     Patient Active Problem List   Diagnosis Date Noted  . Chronic combined systolic and diastolic CHF (congestive heart failure) (HCC)   . Hypercholesteremia   . Chest pain with high risk for cardiac etiology 03/24/2016  . Coronary artery disease due to lipid rich plaque   . Hypertensive heart disease without CHF   . Hyperlipidemia   . Ischemic cardiomyopathy   . H/O medication noncompliance   . Chest pain with moderate risk for cardiac etiology 03/16/2016  . ACS (acute coronary syndrome) (HCC) 03/16/2016  . Chest pain 03/16/2016  . Coronary artery disease involving native coronary artery of native heart with angina pectoris with documented spasm (HCC)   . Cardiomyopathy, ischemic 09/22/2015  . ST elevation (STEMI) myocardial infarction involving left anterior descending coronary artery (HCC) 09/14/2015  . Ischemic chest pain (HCC)   . Morbid obesity (HCC)   . Pre-diabetes   . Tobacco abuse   . CAD S/P percutaneous coronary angioplasty   . Essential hypertension   . Cellulitis of hand 05/27/2015  . 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 05/26/2015  . NSTEMI (non-ST elevated myocardial infarction) (HCC) 04/24/2015  . Depression 12/26/2014  . GAD (generalized anxiety disorder) 12/26/2014  . GERD (gastroesophageal reflux disease) 03/01/2013  .  Mixed hyperlipidemia 03/14/2011    Past Surgical History:  Procedure Laterality Date  . CARDIAC CATHETERIZATION N/A 04/23/2015   Procedure: Left Heart Cath and Coronary Angiography;  Surgeon: Wendall Stade, MD;  Location: [facility] INVASIVE CV LAB;  Service: Cardiovascular;  Laterality: N/A;  . CARDIAC CATHETERIZATION N/A 04/23/2015   Procedure: Coronary Stent Intervention;  Surgeon: Tonny Bollman, MD;  Location: [facility] INVASIVE CV LAB;  Service: Cardiovascular;  Laterality: N/A;  . CARDIAC  CATHETERIZATION N/A 09/14/2015   Procedure: Left Heart Cath and Coronary Angiography;  Surgeon: Lyn Records, MD;  LAD 100%, CFX 10% ISR, RCA 100% (chronic), EF 25-35%  . CARDIAC CATHETERIZATION N/A 09/14/2015   Procedure: Coronary Stent Intervention;  Surgeon: Lyn Records, MD;  Location: [facility] INVASIVE CV LAB;  Service: Cardiovascular;  Laterality: N/A; 3.0 x 16 mm Synergy DES LAD  . CARDIAC CATHETERIZATION  03/16/2016  . CARDIAC CATHETERIZATION N/A 03/16/2016   Procedure: Left Heart Cath and Coronary Angiography;  Surgeon: Lyn Records, MD;  Location: [facility] INVASIVE CV LAB;  Service: Cardiovascular;  Laterality: N/A;  . CARDIAC CATHETERIZATION N/A 03/16/2016   Procedure: Coronary Balloon Angioplasty;  Surgeon: Lyn Records, MD;  Location: [facility] INVASIVE CV LAB;  Service: Cardiovascular;  Laterality: N/A;  . CARDIAC CATHETERIZATION N/A 03/25/2016   Procedure: Left Heart Cath and Coronary Angiography;  Surgeon: Peter M Swaziland, MD;  Location: [facility] INVASIVE CV LAB;  Service: Cardiovascular;  Laterality: N/A;  . CORONARY ANGIOPLASTY WITH STENT PLACEMENT  2010; 2013       Home Medications    Prior to Admission medications   Medication Sig Start Date End Date Taking? Authorizing Provider  amoxicillin (AMOXIL) 500 MG capsule Take 1 capsule (500 mg total) by mouth 3 (three) times daily. 06/12/16   Hope Orlene Och, NP  aspirin 81 MG EC tablet Take 1 tablet (81 mg total) by mouth daily. 03/17/16   Bhavinkumar Bhagat, PA  atorvastatin (LIPITOR) 80 MG tablet Take 1 tablet (80 mg total) by mouth daily at 6 PM. 09/11/15   Leone Brand, NP  carvedilol (COREG) 12.5 MG tablet Take 1 tablet (12.5 mg total) by mouth 2 (two) times daily. 04/12/16 07/11/16  Duke Salvia, MD  fenofibrate (TRICOR) 48 MG tablet Take 1 tablet (48 mg total) by mouth daily. 07/08/15   Dayna N Dunn, PA-C  HYDROcodone-acetaminophen (NORCO) 5-325 MG tablet Take 1 tablet by mouth every 6 (six) hours as needed. 06/12/16   Hope Orlene Och, NP  isosorbide  mononitrate (IMDUR) 30 MG 24 hr tablet Take 1 tablet (30 mg total) by mouth daily. 03/26/16   Ok Anis, NP  nitroGLYCERIN (NITROSTAT) 0.4 MG SL tablet Place 0.4 mg under the tongue every 5 (five) minutes as needed for chest pain (3 x).    Historical Provider, MD  sacubitril-valsartan (ENTRESTO) 24-26 MG Take 1 tablet by mouth 2 (two) times daily. 04/12/16   Duke Salvia, MD  sertraline (ZOLOFT) 100 MG tablet Take 1 tablet (100 mg total) by mouth daily. 04/19/16   Jaclyn Shaggy, MD  spironolactone (ALDACTONE) 25 MG tablet Take 1 tablet (25 mg total) by mouth daily. 03/26/16   Ok Anis, NP  ticagrelor (BRILINTA) 90 MG TABS tablet Take 1 tablet (90 mg total) by mouth 2 (two) times daily. 03/31/16   Jaclyn Shaggy, MD    Family History Family History  Problem Relation Age of Onset  . Arrhythmia Mother     MOTHER LIVED TO BE 89  . Coronary artery  disease Mother   . Heart attack Mother   . Hypertension Mother   . Coronary artery disease Father [id number]  . Heart disease Father   . Heart attack Father   . Heart attack Brother   . Stroke Neg Hx     Social History Social History  Substance Use Topics  . Smoking status: Former Smoker    Packs/day: 1.00    Years: 24.00    Types: Cigarettes  . Smokeless tobacco: Never Used     Comment: quit on 03/15/16  . Alcohol use No     Allergies   Review of patient's allergies indicates no known allergies.   Review of Systems Review of Systems  Constitutional: Positive for chills. Negative for fever.  HENT: Positive for dental problem. Negative for ear pain.   Neurological: Positive for headaches.     Physical Exam Updated Vital Signs BP 146/80 (BP Location: Left Arm)   Pulse 73   Temp 97.9 F (36.6 C) (Oral)   Resp 20   Ht 5\' 7"  (1.702 m)   Wt 119.3 kg   SpO2 95%   BMI 41.19 kg/m   Physical Exam  Constitutional: He is oriented to person, place, and time. He appears well-developed and well-nourished. No distress.  HENT:    Head: Normocephalic and atraumatic.  Mouth/Throat: Uvula is midline and oropharynx is clear and moist. No trismus in the jaw. Dental caries present.    There is decay to the right upper first molar. The area is tender on exam. There is erythema of the gum surrounding the tooth.   Eyes: EOM are normal.  Neck: Normal range of motion.  Cardiovascular: Normal rate, regular rhythm, normal heart sounds and intact distal pulses.   Pulmonary/Chest: Effort normal and breath sounds normal. No respiratory distress.  Abdominal: Soft. He exhibits no distension. There is no tenderness.  Musculoskeletal: Normal range of motion.  Lymphadenopathy:    He has cervical adenopathy (right side).  Neurological: He is alert and oriented to person, place, and time.  Skin: Skin is warm and dry.  Psychiatric: He has a normal mood and affect. Judgment normal.  Nursing note and vitals reviewed.    ED Treatments / Results  DIAGNOSTIC STUDIES: Oxygen Saturation is 100% on RA, normal by my interpretation.    COORDINATION OF CARE: 10:16 PM Discussed treatment plan with pt at bedside and pt agreed to plan.    Labs (all labs ordered are listed, but only abnormal results are displayed) Labs Reviewed - No data to display  Radiology No results found.  Procedures Procedures (including critical care time)  Medications Ordered in ED Medications  amoxicillin (AMOXIL) capsule 500 mg (500 mg Oral Given 06/12/16 2115)     Initial Impression / Assessment and Plan / ED Course  I have reviewed the triage vital signs and the nursing notes.   Clinical Course    Patient with dentalgia.  No abscess requiring immediate incision and drainage.  Exam not concerning for Ludwig's angina or pharyngeal abscess.  Will treat with Amoxicillin. Pt instructed to follow-up with dentist.  Discussed return precautions. Pt safe for discharge.   Final Clinical Impressions(s) / ED Diagnoses   Final diagnoses:  Pain, dental   Dental infection    New Prescriptions Discharge Medication List as of 06/12/2016  9:13 PM    START taking these medications   Details  amoxicillin (AMOXIL) 500 MG capsule Take 1 capsule (500 mg total) by mouth 3 (three) times daily., Starting Sun 06/12/2016,  Print    HYDROcodone-acetaminophen (NORCO) 5-325 MG tablet Take 1 tablet by mouth every 6 (six) hours as needed., Starting Sun 06/12/2016, Print      *I personally performed the services described in this documentation, which was scribed in my presence. The recorded information has been reviewed and is accurate.     [address] [place], [place] 06/13/16 7829    Lyndal Pulley, MD 06/13/16 [phone]

## 2016-06-17 ENCOUNTER — Telehealth: Payer: Self-pay | Admitting: Family Medicine

## 2016-06-17 NOTE — Telephone Encounter (Signed)
Writer called Morrie Sheldonshley back from [facility] who stated that patient needs a tooth extracted and a deep dental cleaning.  She will fax a release form to Dr. Venetia NightAmao to clear him for this procedure.

## 2016-06-17 NOTE — Telephone Encounter (Signed)
Patient called the office to follow up on the document that was faxed from the dentist ([facility]) to have procedure done and that needs to be signed by PCP. Pt also stated that he is experiencing a lot of pain due to tooth ache. Please follow up.  Thank you

## 2016-06-17 NOTE — Telephone Encounter (Signed)
Writer called patient back and informed him that Clinical research associatewriter spoke with [facility] and they were faxing a form over for Dr. Venetia NightAmao to review for her to sign off so patient is able to have to tooth extraction.  Writer also mentioned to him that he would need to see MD in order for her to prescribe any medication.  Writer encouraged patient to call dentist for pain advice.

## 2016-06-17 NOTE — Telephone Encounter (Signed)
Morrie SheldonAshley from [facility] called requesting a medical clearance for patient. Please follow up.   Morrie Sheldonshley [phone]

## 2016-06-18 ENCOUNTER — Emergency Department (HOSPITAL_COMMUNITY)
Admission: EM | Admit: 2016-06-18 | Discharge: 2016-06-18 | Disposition: A | Discharge status: Home / Self Care | Payer: Medicaid Other | Source: Home / Self Care | Attending: Emergency Medicine | Admitting: Emergency Medicine

## 2016-06-18 ENCOUNTER — Encounter (HOSPITAL_COMMUNITY): Payer: Self-pay | Admitting: *Deleted

## 2016-06-18 DIAGNOSIS — Z79899 Other long term (current) drug therapy: Secondary | ICD-10-CM

## 2016-06-18 DIAGNOSIS — E119 Type 2 diabetes mellitus without complications: Secondary | ICD-10-CM | POA: Diagnosis not present

## 2016-06-18 DIAGNOSIS — I252 Old myocardial infarction: Secondary | ICD-10-CM

## 2016-06-18 DIAGNOSIS — Z87891 Personal history of nicotine dependence: Secondary | ICD-10-CM | POA: Insufficient documentation

## 2016-06-18 DIAGNOSIS — I2510 Atherosclerotic heart disease of native coronary artery without angina pectoris: Secondary | ICD-10-CM

## 2016-06-18 DIAGNOSIS — I110 Hypertensive heart disease with heart failure: Secondary | ICD-10-CM | POA: Insufficient documentation

## 2016-06-18 DIAGNOSIS — I10 Essential (primary) hypertension: Secondary | ICD-10-CM | POA: Insufficient documentation

## 2016-06-18 DIAGNOSIS — Z7982 Long term (current) use of aspirin: Secondary | ICD-10-CM | POA: Insufficient documentation

## 2016-06-18 DIAGNOSIS — I5042 Chronic combined systolic (congestive) and diastolic (congestive) heart failure: Secondary | ICD-10-CM

## 2016-06-18 DIAGNOSIS — Z5321 Procedure and treatment not carried out due to patient leaving prior to being seen by health care provider: Secondary | ICD-10-CM | POA: Insufficient documentation

## 2016-06-18 DIAGNOSIS — K0889 Other specified disorders of teeth and supporting structures: Secondary | ICD-10-CM | POA: Insufficient documentation

## 2016-06-18 DIAGNOSIS — Z955 Presence of coronary angioplasty implant and graft: Secondary | ICD-10-CM | POA: Insufficient documentation

## 2016-06-18 NOTE — ED Triage Notes (Signed)
Pt c/o R upper toothache x 1 week. Had a dentist appt but needed his cardiologist to clear him before the dentist could see him. Pt reports having a second dentist appt but has unbearable pain

## 2016-06-18 NOTE — ED Notes (Signed)
Pt came out of room to triage desk to ask if the doctor was coming to see him soon. Pt states he will be leaving if the doctor does not see him soon. Pt was explained what the process is in the ED and that a provider will be with him as soon as possible

## 2016-06-18 NOTE — ED Provider Notes (Signed)
This patient presented to the ED for dental pain. I sent my PA student to see the patient and the PA student told me he complained of dental pain and was requesting more narcotic pain medications. The patient walked out of the ER before I could see the patient. He walked out in the time for my PA student to tell me about the patient and me to walk up to the room. He told nursing staff he did not want to wait any longer and walked out. Patient eloped.    Everlene FarrierWilliam Jeroline Wolbert, PA-C 06/18/16 2347    Nira ConnPedro Eduardo Cardama, MD 06/20/16 [phone]

## 2016-06-18 NOTE — ED Notes (Signed)
Pt didn't want to wait any longer Pt left after traige

## 2016-06-18 NOTE — ED Triage Notes (Signed)
Pt states that he lost a feeling to his tooth approx a month ago; pt states that the pain is to the rt upper jaw area; pt was seen on 10/29 for the same thing and was prescribed pain medication and antibiotics; pt states that he saw a dentist yesterday and needed a release from his PCP to have his tooth extracted; pt states that it was not done prior to the end of the day; pt states that he is still having pain and wants some more pain medication; pt states that he still antibiotics but states 'I need some more pain pills"

## 2016-06-18 NOTE — ED Notes (Signed)
Pt left AMA. Pt refused to stop to sign out AMA and refused to have his vital signs taken

## 2016-06-19 ENCOUNTER — Emergency Department (HOSPITAL_COMMUNITY)
Admission: EM | Admit: 2016-06-19 | Discharge: 2016-06-19 | Disposition: A | Discharge status: Home / Self Care | Payer: Medicaid Other | Attending: Emergency Medicine | Admitting: Emergency Medicine

## 2016-06-19 DIAGNOSIS — K0889 Other specified disorders of teeth and supporting structures: Secondary | ICD-10-CM

## 2016-06-19 MED ORDER — HYDROCODONE-ACETAMINOPHEN 5-325 MG PO TABS: 1.0000 | ORAL_TABLET | Freq: Four times a day (QID) | ORAL | 0 refills | Status: DC | PRN

## 2016-06-19 MED ORDER — IBUPROFEN 800 MG PO TABS: 800.0000 mg | ORAL_TABLET | Freq: Three times a day (TID) | ORAL | 0 refills | Status: DC

## 2016-06-19 NOTE — ED Notes (Signed)
Pt departed in NAD.  

## 2016-06-19 NOTE — Discharge Instructions (Signed)
Medications: Norco, ibuprofen  Treatment: Take ibuprofen every 8 hours as needed for moderate pain. You can take 1 Norco every 4-6 hours for severe pain. Continue taking your antibiotic as prescribed and make sure to finish.  Follow-up: Please call your dentist on Monday for follow up and further evaluation and treatment as soon as possible. Please return to the emergency department if you develop any new or worsening symptoms.

## 2016-06-20 NOTE — ED Provider Notes (Signed)
[facility]-EMERGENCY DEPT Provider Note   CSN: [id number] Arrival date & time: 06/18/16  2347     History   Chief Complaint Chief Complaint  Patient presents with  . Dental Pain    HPI Peter Russo is a 40 y.o. male patient presenting with a one-week history of dental pain. Patient reports that the filling came out. He was seen by dentist yesterday, however he could not have the tooth extracted without cardiology clearance. His dentist could not give him any pain medication until the tooth extraction. Patient is taking Tylenol at home without relief. Patient is currently taking amoxicillin per dentist. Patient denies any fevers, neck pain, chest pain, shortness of breath, abdominal pain, nausea, vomiting.  HPI  Past Medical History:  Diagnosis Date  . Anxiety   . CAD S/P percutaneous coronary angioplasty    a. s/p BMS-mLAD 2010. b. DES to RCA 2012. c. 04/2015: DES to LCx and PCI to LAD c/b dissection with subsequent overlapping DES to mLAD - r/i for NSTEMI afterwards; d. 08/2015 Ant STEMI in setting of noncompliance->1300mLAD ISR (DES); e. 03/2016 PCI of apical LAD; f. 03/2016 Relook Cath: patent LAD stents, D1 25, D2 45, LCX 10p/m, RCA 100p CTO-->Med Rx.  . Chronic combined systolic and diastolic CHF (congestive heart failure) (HCC)    a. 03/2016 Echo: EF 30-35%, Gr2 DD  . Depression   . Essential hypertension   . GAD (generalized anxiety disorder)   . GERD (gastroesophageal reflux disease)   . Hypercholesteremia   . Ischemic cardiomyopathy 09/14/2015   a. prior EF 25-35 percent at cath, 35-40 percent by echo; b. 03/2016 Echo: EF 30-35%, diff HK, Gr2 DD.  . Morbid obesity (HCC)   . Pre-diabetes   . ST elevation (STEMI) myocardial infarction involving left anterior descending coronary artery (HCC) 09/14/2015   3.0 x 16 mm Synergy DES to the LAD for ISR BMS  . SVT (supraventricular tachycardia) (HCC) APRROX 10 YRS AGO   PRESUMED AVNRT, ECHO 12/07 WITH EF 65%, MILD LAE  . Tobacco abuse      Patient Active Problem List   Diagnosis Date Noted  . Chronic combined systolic and diastolic CHF (congestive heart failure) (HCC)   . Hypercholesteremia   . Chest pain with high risk for cardiac etiology 03/24/2016  . Coronary artery disease due to lipid rich plaque   . Hypertensive heart disease without CHF   . Hyperlipidemia   . Ischemic cardiomyopathy   . H/O medication noncompliance   . Chest pain with moderate risk for cardiac etiology 03/16/2016  . ACS (acute coronary syndrome) (HCC) 03/16/2016  . Chest pain 03/16/2016  . Coronary artery disease involving native coronary artery of native heart with angina pectoris with documented spasm (HCC)   . Cardiomyopathy, ischemic 09/22/2015  . ST elevation (STEMI) myocardial infarction involving left anterior descending coronary artery (HCC) 09/14/2015  . Ischemic chest pain (HCC)   . Morbid obesity (HCC)   . Pre-diabetes   . Tobacco abuse   . CAD S/P percutaneous coronary angioplasty   . Essential hypertension   . Cellulitis of hand 05/27/2015  . 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 05/26/2015  . NSTEMI (non-ST elevated myocardial infarction) (HCC) 04/24/2015  . Depression 12/26/2014  . GAD (generalized anxiety disorder) 12/26/2014  . GERD (gastroesophageal reflux disease) 03/01/2013  . Mixed hyperlipidemia 03/14/2011    Past Surgical History:  Procedure Laterality Date  . CARDIAC CATHETERIZATION N/A 04/23/2015   Procedure: Left Heart Cath and Coronary Angiography;  Surgeon:  Wendall Stade, MD;  Location: [facility] INVASIVE CV LAB;  Service: Cardiovascular;  Laterality: N/A;  . CARDIAC CATHETERIZATION N/A 04/23/2015   Procedure: Coronary Stent Intervention;  Surgeon: Tonny Bollman, MD;  Location: [facility] INVASIVE CV LAB;  Service: Cardiovascular;  Laterality: N/A;  . CARDIAC CATHETERIZATION N/A 09/14/2015   Procedure: Left Heart Cath and Coronary Angiography;  Surgeon: Lyn Records, MD;  LAD 100%, CFX 10% ISR, RCA 100%  (chronic), EF 25-35%  . CARDIAC CATHETERIZATION N/A 09/14/2015   Procedure: Coronary Stent Intervention;  Surgeon: Lyn Records, MD;  Location: [facility] INVASIVE CV LAB;  Service: Cardiovascular;  Laterality: N/A; 3.0 x 16 mm Synergy DES LAD  . CARDIAC CATHETERIZATION  03/16/2016  . CARDIAC CATHETERIZATION N/A 03/16/2016   Procedure: Left Heart Cath and Coronary Angiography;  Surgeon: Lyn Records, MD;  Location: [facility] INVASIVE CV LAB;  Service: Cardiovascular;  Laterality: N/A;  . CARDIAC CATHETERIZATION N/A 03/16/2016   Procedure: Coronary Balloon Angioplasty;  Surgeon: Lyn Records, MD;  Location: [facility] INVASIVE CV LAB;  Service: Cardiovascular;  Laterality: N/A;  . CARDIAC CATHETERIZATION N/A 03/25/2016   Procedure: Left Heart Cath and Coronary Angiography;  Surgeon: Peter M Swaziland, MD;  Location: [facility] INVASIVE CV LAB;  Service: Cardiovascular;  Laterality: N/A;  . CORONARY ANGIOPLASTY WITH STENT PLACEMENT  2010; 2013       Home Medications    Prior to Admission medications   Medication Sig Start Date End Date Taking? Authorizing Provider  amoxicillin (AMOXIL) 500 MG capsule Take 1 capsule (500 mg total) by mouth 3 (three) times daily. 06/12/16   Hope Orlene Och, NP  aspirin 81 MG EC tablet Take 1 tablet (81 mg total) by mouth daily. 03/17/16   Bhavinkumar Bhagat, PA  atorvastatin (LIPITOR) 80 MG tablet Take 1 tablet (80 mg total) by mouth daily at 6 PM. 09/11/15   Leone Brand, NP  carvedilol (COREG) 12.5 MG tablet Take 1 tablet (12.5 mg total) by mouth 2 (two) times daily. 04/12/16 07/11/16  Duke Salvia, MD  fenofibrate (TRICOR) 48 MG tablet Take 1 tablet (48 mg total) by mouth daily. 07/08/15   Dayna N Dunn, PA-C  HYDROcodone-acetaminophen (NORCO) 5-325 MG tablet Take 1 tablet by mouth every 6 (six) hours as needed for severe pain. 06/19/16   Emi Holes, PA-C  ibuprofen (ADVIL,MOTRIN) 800 MG tablet Take 1 tablet (800 mg total) by mouth 3 (three) times daily. 06/19/16   Emi Holes, PA-C    isosorbide mononitrate (IMDUR) 30 MG 24 hr tablet Take 1 tablet (30 mg total) by mouth daily. 03/26/16   Ok Anis, NP  nitroGLYCERIN (NITROSTAT) 0.4 MG SL tablet Place 0.4 mg under the tongue every 5 (five) minutes as needed for chest pain (3 x).    Historical Provider, MD  sacubitril-valsartan (ENTRESTO) 24-26 MG Take 1 tablet by mouth 2 (two) times daily. 04/12/16   Duke Salvia, MD  sertraline (ZOLOFT) 100 MG tablet Take 1 tablet (100 mg total) by mouth daily. 04/19/16   Jaclyn Shaggy, MD  spironolactone (ALDACTONE) 25 MG tablet Take 1 tablet (25 mg total) by mouth daily. 03/26/16   Ok Anis, NP  ticagrelor (BRILINTA) 90 MG TABS tablet Take 1 tablet (90 mg total) by mouth 2 (two) times daily. 03/31/16   Jaclyn Shaggy, MD    Family History Family History  Problem Relation Age of Onset  . Arrhythmia Mother     MOTHER LIVED TO BE 89  . Coronary artery disease  Mother   . Heart attack Mother   . Hypertension Mother   . Coronary artery disease Father [id number]  . Heart disease Father   . Heart attack Father   . Heart attack Brother   . Stroke Neg Hx     Social History Social History  Substance Use Topics  . Smoking status: Former Smoker    Packs/day: 1.00    Years: 24.00    Types: Cigarettes  . Smokeless tobacco: Never Used     Comment: quit on 03/15/16  . Alcohol use No     Allergies   Patient has no known allergies.   Review of Systems Review of Systems  Constitutional: Negative for fever.  HENT: Positive for dental problem.   Respiratory: Negative for shortness of breath.   Cardiovascular: Negative for chest pain.  Gastrointestinal: Negative for abdominal pain, nausea and vomiting.  Musculoskeletal: Negative for neck pain.     Physical Exam Updated Vital Signs BP 156/81 (BP Location: Right Arm)   Pulse (!) 58   Temp 97.9 F (36.6 C) (Oral)   Resp 18   SpO2 98%   Physical Exam  Constitutional: He appears well-developed and well-nourished. No  distress.  HENT:  Head: Normocephalic and atraumatic.  Mouth/Throat: Oropharynx is clear and moist. No trismus in the jaw. No dental abscesses or uvula swelling. No oropharyngeal exudate.    Eyes: Conjunctivae are normal. Pupils are equal, round, and reactive to light. Right eye exhibits no discharge. Left eye exhibits no discharge. No scleral icterus.  Neck: Normal range of motion. Neck supple. No thyromegaly present.  Cardiovascular: Normal rate, regular rhythm, normal heart sounds and intact distal pulses.  Exam reveals no gallop and no friction rub.   No murmur heard. Pulmonary/Chest: Effort normal and breath sounds normal. No stridor. No respiratory distress. He has no wheezes. He has no rales.  Abdominal: Soft. Bowel sounds are normal. He exhibits no distension. There is no tenderness. There is no rebound and no guarding.  Musculoskeletal: He exhibits no edema.  Lymphadenopathy:    He has no cervical adenopathy.  Neurological: He is alert. Coordination normal.  Skin: Skin is warm and dry. No rash noted. He is not diaphoretic. No pallor.  Psychiatric: He has a normal mood and affect.  Nursing note and vitals reviewed.    ED Treatments / Results  Labs (all labs ordered are listed, but only abnormal results are displayed) Labs Reviewed - No data to display  EKG  EKG Interpretation None       Radiology No results found.  Procedures Procedures (including critical care time)  Medications Ordered in ED Medications - No data to display   Initial Impression / Assessment and Plan / ED Course  I have reviewed the triage vital signs and the nursing notes.  Pertinent labs & imaging results that were available during my care of the patient were reviewed by me and considered in my medical decision making (see chart for details).  Clinical Course     MDM Number of Diagnoses or Management Options Pain, dental:   Patient with dentalgia.  No abscess requiring immediate  incision and drainage.  Exam not concerning for Ludwig's angina or pharyngeal abscess.  Will treat with ibuprofen, short course of Norco for breakthrough pain. Patient advised to continue taking amoxicillin. Pt instructed to follow-up with dentist as soon as possible for treatment..  Discussed return precautions. Pt safe for discharge.  Final Clinical Impressions(s) / ED Diagnoses   Final diagnoses:  Pain, dental    New Prescriptions Discharge Medication List as of 06/19/2016 12:51 AM    START taking these medications   Details  ibuprofen (ADVIL,MOTRIN) 800 MG tablet Take 1 tablet (800 mg total) by mouth 3 (three) times daily., Starting Sun 06/19/2016, Print      ,   Emi Holes, PA-C 06/20/16 0030    Shaune Pollack, MD 06/20/16 [id number]

## 2016-07-04 ENCOUNTER — Other Ambulatory Visit (HOSPITAL_COMMUNITY): Payer: Self-pay

## 2016-07-20 ENCOUNTER — Encounter: Payer: Self-pay | Admitting: Internal Medicine

## 2016-07-26 ENCOUNTER — Ambulatory Visit: Payer: Self-pay | Admitting: Internal Medicine

## 2016-08-17 ENCOUNTER — Encounter: Payer: Self-pay | Admitting: Internal Medicine

## 2016-08-17 ENCOUNTER — Encounter (INDEPENDENT_AMBULATORY_CARE_PROVIDER_SITE_OTHER): Payer: Self-pay

## 2016-08-17 ENCOUNTER — Ambulatory Visit (INDEPENDENT_AMBULATORY_CARE_PROVIDER_SITE_OTHER): Payer: Medicaid Other | Admitting: Internal Medicine

## 2016-08-17 VITALS — BP 140/96 | HR 71 | Ht 67.0 in | Wt 272.4 lb

## 2016-08-17 DIAGNOSIS — I5022 Chronic systolic (congestive) heart failure: Secondary | ICD-10-CM | POA: Diagnosis not present

## 2016-08-17 DIAGNOSIS — I255 Ischemic cardiomyopathy: Secondary | ICD-10-CM | POA: Diagnosis not present

## 2016-08-17 NOTE — Patient Instructions (Addendum)
Medication Instructions: - Your physician recommends that you continue on your current medications as directed. Please refer to the Current Medication list given to you today.  Labwork: - Your physician recommends that you have lab work today: [employer]  Procedures/Testing: - Your physician has requested that you have an echocardiogram. Echocardiography is a painless test that uses sound waves to create images of your heart. It provides your doctor with information about the size and shape of your heart and how well your heart's chambers and valves are working. This procedure takes approximately one hour. There are no restrictions for this procedure.  Follow-Up: - pending results of your echo  Any Additional Special Instructions Will Be Listed Below (If Applicable).     If you need a refill on your cardiac medications before your next appointment, please call your pharmacy.

## 2016-08-17 NOTE — Progress Notes (Signed)
Patient Care Team: Jaclyn ShaggyEnobong Amao, MD as PCP - General (Family Medicine)   HPI  Peter BloomMark E Russo is a 41 y.o. male Seen in follow-up regarding previous consultation for consideration of an ICD. He has ischemic heart disease and has had borderline ejection fraction.  When seen previously-8/17-medication options including up titration of beta blockers and the initiation of Entresto were elected with the anticipation that we would reassess LV function.   The patient denies chest pain, shortness of breath, nocturnal dyspnea, orthopnea or peripheral edema.  There have been no palpitations, lightheadedness or syncope.    Past Medical History:  Diagnosis Date  . Anxiety   . CAD S/P percutaneous coronary angioplasty    a. s/p BMS-mLAD 2010. b. DES to RCA 2012. c. 04/2015: DES to LCx and PCI to LAD c/b dissection with subsequent overlapping DES to mLAD - r/i for NSTEMI afterwards; d. 08/2015 Ant STEMI in setting of noncompliance->16300mLAD ISR (DES); e. 03/2016 PCI of apical LAD; f. 03/2016 Relook Cath: patent LAD stents, D1 25, D2 45, LCX 10p/m, RCA 100p CTO-->Med Rx.  . Chronic combined systolic and diastolic CHF (congestive heart failure) (HCC)    a. 03/2016 Echo: EF 30-35%, Gr2 DD  . Depression   . Essential hypertension   . GAD (generalized anxiety disorder)   . GERD (gastroesophageal reflux disease)   . Hypercholesteremia   . Ischemic cardiomyopathy 09/14/2015   a. prior EF 25-35 percent at cath, 35-40 percent by echo; b. 03/2016 Echo: EF 30-35%, diff HK, Gr2 DD.  . Morbid obesity (HCC)   . Pre-diabetes   . ST elevation (STEMI) myocardial infarction involving left anterior descending coronary artery (HCC) 09/14/2015   3.0 x 16 mm Synergy DES to the LAD for ISR BMS  . SVT (supraventricular tachycardia) (HCC) APRROX 10 YRS AGO   PRESUMED AVNRT, ECHO 12/07 WITH EF 65%, MILD LAE  . Tobacco abuse     Past Surgical History:  Procedure Laterality Date  . CARDIAC CATHETERIZATION N/A  04/23/2015   Procedure: Left Heart Cath and Coronary Angiography;  Surgeon: Wendall StadePeter C Nishan, MD;  [facility];  Service: Cardiovascular;  Laterality: N/A;  . CARDIAC CATHETERIZATION N/A 04/23/2015   Procedure: Coronary Stent Intervention;  Surgeon: Tonny BollmanMichael Cooper, MD;  [facility];  Service: Cardiovascular;  Laterality: N/A;  . CARDIAC CATHETERIZATION N/A 09/14/2015   Procedure: Left Heart Cath and Coronary Angiography;  Surgeon: Lyn RecordsHenry W Smith, MD;  LAD 100%, CFX 10% ISR, RCA 100% (chronic), EF 25-35%  . CARDIAC CATHETERIZATION N/A 09/14/2015   Procedure: Coronary Stent Intervention;  Surgeon: Lyn RecordsHenry W Smith, MD;  [facility];  Service: Cardiovascular;  Laterality: N/A; 3.0 x 16 mm Synergy DES LAD  . CARDIAC CATHETERIZATION  03/16/2016  . CARDIAC CATHETERIZATION N/A 03/16/2016   Procedure: Left Heart Cath and Coronary Angiography;  Surgeon: Lyn RecordsHenry W Smith, MD;  [facility];  Service: Cardiovascular;  Laterality: N/A;  . CARDIAC CATHETERIZATION N/A 03/16/2016   Procedure: Coronary Balloon Angioplasty;  Surgeon: Lyn RecordsHenry W Smith, MD;  [facility];  Service: Cardiovascular;  Laterality: N/A;  . CARDIAC CATHETERIZATION N/A 03/25/2016   Procedure: Left Heart Cath and Coronary Angiography;  Surgeon: Peter M SwazilandJordan, MD;  [facility];  Service: Cardiovascular;  Laterality: N/A;  . CORONARY ANGIOPLASTY WITH STENT PLACEMENT  2010; 2013    Current Outpatient Prescriptions  Medication Sig Dispense Refill  . amoxicillin (AMOXIL) 500  MG capsule Take 1 capsule (500 mg total) by mouth 3 (three) times daily. 30 capsule 0  . aspirin 81 MG EC tablet Take 1 tablet (81 mg total) by mouth daily. 30 tablet 11  . atorvastatin (LIPITOR) 80 MG tablet Take 1 tablet (80 mg total) by mouth daily at 6 PM. 90 tablet 3  . fenofibrate (TRICOR) 48 MG tablet Take 1 tablet (48 mg total) by mouth daily. 30 tablet 11  . HYDROcodone-acetaminophen  (NORCO) 5-325 MG tablet Take 1 tablet by mouth every 6 (six) hours as needed for severe pain. 8 tablet 0  . ibuprofen (ADVIL,MOTRIN) 800 MG tablet Take 1 tablet (800 mg total) by mouth 3 (three) times daily. 21 tablet 0  . isosorbide mononitrate (IMDUR) 30 MG 24 hr tablet Take 1 tablet (30 mg total) by mouth daily. 30 tablet 6  . nitroGLYCERIN (NITROSTAT) 0.4 MG SL tablet Place 0.4 mg under the tongue every 5 (five) minutes as needed for chest pain (3 x).    . sacubitril-valsartan (ENTRESTO) 24-26 MG Take 1 tablet by mouth 2 (two) times daily. 60 tablet 6  . sertraline (ZOLOFT) 100 MG tablet Take 1 tablet (100 mg total) by mouth daily. 30 tablet 3  . spironolactone (ALDACTONE) 25 MG tablet Take 1 tablet (25 mg total) by mouth daily. 30 tablet 6  . ticagrelor (BRILINTA) 90 MG TABS tablet Take 1 tablet (90 mg total) by mouth 2 (two) times daily. 180 tablet 3  . carvedilol (COREG) 12.5 MG tablet Take 1 tablet (12.5 mg total) by mouth 2 (two) times daily. 60 tablet 6   No current facility-administered medications for this visit.     No Known Allergies    Review of Systems negative except from HPI and PMH  Physical Exam BP (!) 140/96   Pulse 71   Ht 5\' 7"  (1.702 m)   Wt 272 lb 6.4 oz (123.6 kg)   SpO2 98%   BMI 42.66 kg/m  Well developed and well nourished in no acute distress HENT normal E scleral and icterus clear Neck Supple JVP flat; carotids brisk and full Clear to ausculation  Regular rate and rhythm, no murmurs gallops or rub Soft with active bowel sounds No clubbing cyanosis  Edema Alert and oriented, grossly normal motor and sensory function Skin Warm and Dry  ECG  Sinus @ 76 15/08/40 IMI Anterior MI olds  Assessment and  Plan  ICM  CHF chronic systolic  Depression   HTN  Obesity  Pt is doing much better,  Mood is better  Functional status improved  Euvolemic continue current meds  With BP elevated today, but not having taken meds this am, will not  increase meds  Needs echo to evaluation indication for an ICD         Current medicines are reviewed at length with the patient today .  The patient does not  have concerns regarding medicines.

## 2016-08-18 LAB — BASIC METABOLIC PANEL
BUN/Creatinine Ratio: 9 (ref 9–20)
BUN: 9 mg/dL (ref 6–24)
CO2: 26 mmol/L (ref 18–29)
Calcium: 9.5 mg/dL (ref 8.7–10.2)
Chloride: 101 mmol/L (ref 96–106)
Creatinine, Ser: 0.96 mg/dL (ref 0.76–1.27)
GFR calc Af Amer: 112 mL/min/{1.73_m2} (ref >59)
GFR calc non Af Amer: 97 mL/min/{1.73_m2} (ref >59)
Glucose: 87 mg/dL (ref 65–99)
Potassium: 4.5 mmol/L (ref 3.5–5.2)
Sodium: 142 mmol/L (ref 134–144)

## 2016-08-24 MED FILL — ?SPIRONOLACTONE 25 MG TABLE: 25 MG | 30 days supply | Qty: 30 | Fill #1

## 2016-08-24 MED FILL — ?CARVEDILOL 12.5 MG TABLET: 12.5 | 30 days supply | Qty: 60 | Fill #1

## 2016-08-24 MED FILL — SERTRALINE HCL 100 MG TAB: 100 | 30 days supply | Qty: 30 | Fill #0

## 2016-08-24 MED FILL — ISOSORBIDE MN ER 30 MG TAB: 30 | 30 days supply | Qty: 30 | Fill #1

## 2016-08-31 ENCOUNTER — Other Ambulatory Visit (HOSPITAL_COMMUNITY): Payer: Medicaid Other

## 2016-09-08 ENCOUNTER — Ambulatory Visit (HOSPITAL_COMMUNITY): Payer: Medicaid Other | Attending: Internal Medicine

## 2016-09-08 DIAGNOSIS — I5022 Chronic systolic (congestive) heart failure: Secondary | ICD-10-CM | POA: Insufficient documentation

## 2016-09-08 DIAGNOSIS — E785 Hyperlipidemia, unspecified: Secondary | ICD-10-CM | POA: Insufficient documentation

## 2016-09-08 DIAGNOSIS — Z955 Presence of coronary angioplasty implant and graft: Secondary | ICD-10-CM | POA: Diagnosis not present

## 2016-09-08 DIAGNOSIS — I110 Hypertensive heart disease with heart failure: Secondary | ICD-10-CM | POA: Diagnosis not present

## 2016-09-08 DIAGNOSIS — I255 Ischemic cardiomyopathy: Secondary | ICD-10-CM | POA: Diagnosis present

## 2016-09-08 DIAGNOSIS — I2510 Atherosclerotic heart disease of native coronary artery without angina pectoris: Secondary | ICD-10-CM | POA: Insufficient documentation

## 2016-09-08 LAB — ECHOCARDIOGRAM COMPLETE
E decel time: 155 msec
E/e' ratio: 16.42
FS: 25 % — AB (ref 28–44)
IVS/LV PW RATIO, ED: 0.91
LA ID, A-P, ES: 38 mm
LA diam end sys: 38 mm
LA diam index: 1.65 cm/m2
LA vol A4C: 28 ml
LA vol index: 13 mL/m2
LA vol: 30 mL
LV E/e' medial: 16.42
LV E/e'average: 16.42
LV PW d: 10.1 mm — AB (ref 0.6–1.1)
LV e' LATERAL: 5.59 cm/s
LVOT SV: 58 mL
LVOT VTI: 13.9 cm
LVOT area: 4.15 cm2
LVOT diameter: 23 mm
LVOT peak grad rest: 2 mmHg
LVOT peak vel: 79 cm/s
MV Dec: 155
MV Peak grad: 3 mmHg
MV pk A vel: 66.1 m/s
MV pk E vel: 91.8 m/s
TDI e' lateral: 5.59
TDI e' medial: 6.69

## 2016-09-08 MED ORDER — PERFLUTREN LIPID MICROSPHERE
1.0000 mL | INTRAVENOUS | Status: AC | PRN
  Administered 2016-09-08: 3 mL via INTRAVENOUS

## 2016-10-19 ENCOUNTER — Inpatient Hospital Stay (HOSPITAL_COMMUNITY)
Admission: EM | Admit: 2016-10-19 | Discharge: 2016-10-21 | DRG: 251 | Disposition: A | Discharge status: Home / Self Care | Payer: Medicare Other | Source: Ambulatory Visit | Attending: Interventional Cardiology | Admitting: Interventional Cardiology

## 2016-10-19 ENCOUNTER — Encounter (HOSPITAL_COMMUNITY): Payer: Self-pay | Admitting: Interventional Cardiology

## 2016-10-19 ENCOUNTER — Encounter (HOSPITAL_COMMUNITY)
Admission: EM | Disposition: A | Discharge status: Home / Self Care | Payer: Self-pay | Source: Ambulatory Visit | Attending: Interventional Cardiology

## 2016-10-19 DIAGNOSIS — I5042 Chronic combined systolic (congestive) and diastolic (congestive) heart failure: Secondary | ICD-10-CM | POA: Diagnosis present

## 2016-10-19 DIAGNOSIS — Z9114 Patient's other noncompliance with medication regimen: Secondary | ICD-10-CM

## 2016-10-19 DIAGNOSIS — I255 Ischemic cardiomyopathy: Secondary | ICD-10-CM | POA: Diagnosis present

## 2016-10-19 DIAGNOSIS — Z955 Presence of coronary angioplasty implant and graft: Secondary | ICD-10-CM

## 2016-10-19 DIAGNOSIS — I2510 Atherosclerotic heart disease of native coronary artery without angina pectoris: Secondary | ICD-10-CM | POA: Diagnosis not present

## 2016-10-19 DIAGNOSIS — I2109 ST elevation (STEMI) myocardial infarction involving other coronary artery of anterior wall: Secondary | ICD-10-CM

## 2016-10-19 DIAGNOSIS — E782 Mixed hyperlipidemia: Secondary | ICD-10-CM | POA: Diagnosis not present

## 2016-10-19 DIAGNOSIS — I10 Essential (primary) hypertension: Secondary | ICD-10-CM

## 2016-10-19 DIAGNOSIS — Z79899 Other long term (current) drug therapy: Secondary | ICD-10-CM | POA: Diagnosis not present

## 2016-10-19 DIAGNOSIS — R7303 Prediabetes: Secondary | ICD-10-CM | POA: Diagnosis present

## 2016-10-19 DIAGNOSIS — I472 Ventricular tachycardia: Secondary | ICD-10-CM | POA: Diagnosis not present

## 2016-10-19 DIAGNOSIS — I110 Hypertensive heart disease with heart failure: Secondary | ICD-10-CM | POA: Diagnosis present

## 2016-10-19 DIAGNOSIS — Z791 Long term (current) use of non-steroidal anti-inflammatories (NSAID): Secondary | ICD-10-CM

## 2016-10-19 DIAGNOSIS — Z6841 Body Mass Index (BMI) 40.0 and over, adult: Secondary | ICD-10-CM | POA: Diagnosis not present

## 2016-10-19 DIAGNOSIS — K219 Gastro-esophageal reflux disease without esophagitis: Secondary | ICD-10-CM | POA: Diagnosis present

## 2016-10-19 DIAGNOSIS — F411 Generalized anxiety disorder: Secondary | ICD-10-CM | POA: Diagnosis present

## 2016-10-19 DIAGNOSIS — E785 Hyperlipidemia, unspecified: Secondary | ICD-10-CM | POA: Diagnosis present

## 2016-10-19 DIAGNOSIS — Z9861 Coronary angioplasty status: Secondary | ICD-10-CM

## 2016-10-19 DIAGNOSIS — I2102 ST elevation (STEMI) myocardial infarction involving left anterior descending coronary artery: Secondary | ICD-10-CM | POA: Diagnosis not present

## 2016-10-19 DIAGNOSIS — I252 Old myocardial infarction: Secondary | ICD-10-CM | POA: Diagnosis not present

## 2016-10-19 DIAGNOSIS — Z720 Tobacco use: Secondary | ICD-10-CM | POA: Diagnosis present

## 2016-10-19 DIAGNOSIS — R0789 Other chest pain: Secondary | ICD-10-CM | POA: Diagnosis not present

## 2016-10-19 DIAGNOSIS — Z8249 Family history of ischemic heart disease and other diseases of the circulatory system: Secondary | ICD-10-CM

## 2016-10-19 DIAGNOSIS — Z7982 Long term (current) use of aspirin: Secondary | ICD-10-CM

## 2016-10-19 DIAGNOSIS — Z9119 Patient's noncompliance with other medical treatment and regimen: Secondary | ICD-10-CM | POA: Diagnosis not present

## 2016-10-19 HISTORY — PX: CORONARY ULTRASOUND/IVUS: CATH118244

## 2016-10-19 HISTORY — PX: LEFT HEART CATH AND CORONARY ANGIOGRAPHY: CATH118249

## 2016-10-19 HISTORY — PX: CORONARY BALLOON ANGIOPLASTY: CATH118233

## 2016-10-19 LAB — COMPREHENSIVE METABOLIC PANEL
ALT: 38 U/L (ref 17–63)
AST: 39 U/L (ref 15–41)
Albumin: 3.7 g/dL (ref 3.5–5.0)
Alkaline Phosphatase: 71 U/L (ref 38–126)
Anion gap: 11 (ref 5–15)
BUN: 14 mg/dL (ref 6–20)
CO2: 22 mmol/L (ref 22–32)
Calcium: 9.2 mg/dL (ref 8.9–10.3)
Chloride: 106 mmol/L (ref 101–111)
Creatinine, Ser: 1.11 mg/dL (ref 0.61–1.24)
GFR calc Af Amer: >60 mL/min (ref >60)
GFR calc non Af Amer: >60 mL/min (ref >60)
Glucose, Bld: 137 mg/dL — ABNORMAL HIGH (ref 65–99)
Potassium: 3.6 mmol/L (ref 3.5–5.1)
Sodium: 139 mmol/L (ref 135–145)
Total Bilirubin: 0.3 mg/dL (ref 0.3–1.2)
Total Protein: 6.8 g/dL (ref 6.5–8.1)

## 2016-10-19 LAB — CBC
HCT: 39.8 % (ref 39.0–52.0)
Hemoglobin: 13.8 g/dL (ref 13.0–17.0)
MCH: 29.4 pg (ref 26.0–34.0)
MCHC: 34.7 g/dL (ref 30.0–36.0)
MCV: 84.7 fL (ref 78.0–100.0)
Platelets: 200 10*3/uL (ref 150–400)
RBC: 4.7 MIL/uL (ref 4.22–5.81)
RDW: 14 % (ref 11.5–15.5)
WBC: 10 10*3/uL (ref 4.0–10.5)

## 2016-10-19 LAB — POCT I-STAT, CHEM 8
BUN: 15 mg/dL (ref 6–20)
Calcium, Ion: 1.16 mmol/L (ref 1.15–1.40)
Chloride: 106 mmol/L (ref 101–111)
Creatinine, Ser: 1.1 mg/dL (ref 0.61–1.24)
Glucose, Bld: 144 mg/dL — ABNORMAL HIGH (ref 65–99)
HCT: 41 % (ref 39.0–52.0)
Hemoglobin: 13.9 g/dL (ref 13.0–17.0)
Potassium: 3.7 mmol/L (ref 3.5–5.1)
Sodium: 141 mmol/L (ref 135–145)
TCO2: 23 mmol/L (ref 0–100)

## 2016-10-19 LAB — MRSA PCR SCREENING: MRSA by PCR: NEGATIVE

## 2016-10-19 LAB — LIPID PANEL
Cholesterol: 279 mg/dL — ABNORMAL HIGH (ref 0–200)
HDL: 33 mg/dL — ABNORMAL LOW (ref >40)
LDL Cholesterol: 183 mg/dL — ABNORMAL HIGH (ref 0–99)
Total CHOL/HDL Ratio: 8.5 RATIO
Triglycerides: 313 mg/dL — ABNORMAL HIGH (ref <150)
VLDL: 63 mg/dL — ABNORMAL HIGH (ref 0–40)

## 2016-10-19 LAB — PROTIME-INR
INR: 0.97
Prothrombin Time: 12.9 s (ref 11.4–15.2)

## 2016-10-19 LAB — APTT: aPTT: 33 s (ref 24–36)

## 2016-10-19 LAB — POCT ACTIVATED CLOTTING TIME: Activated Clotting Time: 318 seconds

## 2016-10-19 LAB — TROPONIN I: Troponin I: 0.65 ng/mL (ref <0.03)

## 2016-10-19 SURGERY — LEFT HEART CATH AND CORONARY ANGIOGRAPHY

## 2016-10-19 MED ORDER — NITROGLYCERIN 1 MG/10 ML FOR IR/CATH LAB
INTRA_ARTERIAL | Status: DC | PRN
  Administered 2016-10-19: 200 ug via INTRACORONARY

## 2016-10-19 MED ORDER — TIROFIBAN (AGGRASTAT) BOLUS VIA INFUSION
INTRAVENOUS | Status: DC | PRN
  Administered 2016-10-19: 3100 ug via INTRAVENOUS

## 2016-10-19 MED ORDER — SODIUM CHLORIDE 0.9% FLUSH
3.0000 mL | Freq: Two times a day (BID) | INTRAVENOUS | Status: DC
  Administered 2016-10-19 – 2016-10-21 (×4): 3 mL via INTRAVENOUS

## 2016-10-19 MED ORDER — ONDANSETRON HCL 4 MG/2ML IJ SOLN
4.0000 mg | Freq: Four times a day (QID) | INTRAMUSCULAR | Status: DC | PRN
  Administered 2016-10-20: 4 mg via INTRAVENOUS
  Filled 2016-10-19: qty 2

## 2016-10-19 MED ORDER — ATORVASTATIN CALCIUM 80 MG PO TABS
80.0000 mg | ORAL_TABLET | Freq: Every day | ORAL | Status: DC
  Administered 2016-10-19 – 2016-10-20 (×2): 80 mg via ORAL
  Filled 2016-10-19 (×3): qty 1

## 2016-10-19 MED ORDER — NITROGLYCERIN 1 MG/10 ML FOR IR/CATH LAB
INTRA_ARTERIAL | Status: AC
  Filled 2016-10-19: qty 10

## 2016-10-19 MED ORDER — NITROGLYCERIN 0.4 MG SL SUBL
0.4000 mg | SUBLINGUAL_TABLET | SUBLINGUAL | Status: DC | PRN
  Administered 2016-10-20 (×2): 0.4 mg via SUBLINGUAL
  Filled 2016-10-19: qty 1

## 2016-10-19 MED ORDER — TICAGRELOR 90 MG PO TABS
ORAL_TABLET | ORAL | Status: DC | PRN
  Administered 2016-10-19: 180 mg via ORAL

## 2016-10-19 MED ORDER — TIROFIBAN HCL IN NACL 5-0.9 MG/100ML-% IV SOLN
INTRAVENOUS | Status: AC
  Filled 2016-10-19: qty 100

## 2016-10-19 MED ORDER — ASPIRIN 81 MG PO TBEC: 81.0000 mg | DELAYED_RELEASE_TABLET | Freq: Every day | ORAL | Status: DC

## 2016-10-19 MED ORDER — LABETALOL HCL 5 MG/ML IV SOLN: 10.0000 mg | INTRAVENOUS | Status: AC | PRN

## 2016-10-19 MED ORDER — MIDAZOLAM HCL 2 MG/2ML IJ SOLN
INTRAMUSCULAR | Status: DC | PRN
  Administered 2016-10-19: 2 mg via INTRAVENOUS

## 2016-10-19 MED ORDER — TICAGRELOR 90 MG PO TABS
ORAL_TABLET | ORAL | Status: AC
  Filled 2016-10-19: qty 1

## 2016-10-19 MED ORDER — HYDRALAZINE HCL 20 MG/ML IJ SOLN: 5.0000 mg | INTRAMUSCULAR | Status: AC | PRN

## 2016-10-19 MED ORDER — FENOFIBRATE 54 MG PO TABS
54.0000 mg | ORAL_TABLET | Freq: Every day | ORAL | Status: DC
  Administered 2016-10-19 – 2016-10-21 (×3): 54 mg via ORAL
  Filled 2016-10-19 (×3): qty 1

## 2016-10-19 MED ORDER — TICAGRELOR 90 MG PO TABS
90.0000 mg | ORAL_TABLET | Freq: Two times a day (BID) | ORAL | Status: DC
  Administered 2016-10-19 – 2016-10-21 (×4): 90 mg via ORAL
  Filled 2016-10-19 (×4): qty 1

## 2016-10-19 MED ORDER — HEPARIN SODIUM (PORCINE) 1000 UNIT/ML IJ SOLN
INTRAMUSCULAR | Status: AC
  Filled 2016-10-19: qty 1

## 2016-10-19 MED ORDER — ISOSORBIDE MONONITRATE ER 30 MG PO TB24
30.0000 mg | ORAL_TABLET | Freq: Every day | ORAL | Status: DC
  Administered 2016-10-20 – 2016-10-21 (×2): 30 mg via ORAL
  Filled 2016-10-19 (×2): qty 1

## 2016-10-19 MED ORDER — HEPARIN SODIUM (PORCINE) 1000 UNIT/ML IJ SOLN
INTRAMUSCULAR | Status: DC | PRN
Start: 2016-10-19 — End: 2016-10-19
  Administered 2016-10-19: 10000 [IU] via INTRAVENOUS

## 2016-10-19 MED ORDER — HYDROCODONE-ACETAMINOPHEN 5-325 MG PO TABS
1.0000 | ORAL_TABLET | Freq: Four times a day (QID) | ORAL | Status: DC | PRN
  Administered 2016-10-19: 1 via ORAL
  Filled 2016-10-19: qty 1

## 2016-10-19 MED ORDER — ASPIRIN 81 MG PO CHEW
81.0000 mg | CHEWABLE_TABLET | Freq: Every day | ORAL | Status: DC
  Administered 2016-10-20 – 2016-10-21 (×2): 81 mg via ORAL
  Filled 2016-10-19 (×2): qty 1

## 2016-10-19 MED ORDER — SERTRALINE HCL 100 MG PO TABS
100.0000 mg | ORAL_TABLET | Freq: Every day | ORAL | Status: DC
  Administered 2016-10-19 – 2016-10-21 (×3): 100 mg via ORAL
  Filled 2016-10-19 (×3): qty 1

## 2016-10-19 MED ORDER — SODIUM CHLORIDE 0.9% FLUSH: 3.0000 mL | INTRAVENOUS | Status: DC | PRN

## 2016-10-19 MED ORDER — IOPAMIDOL (ISOVUE-370) INJECTION 76%
INTRAVENOUS | Status: AC
  Filled 2016-10-19: qty 125

## 2016-10-19 MED ORDER — SODIUM CHLORIDE 0.9 % IV SOLN
INTRAVENOUS | Status: DC | PRN
  Administered 2016-10-19: 100 mL/h via INTRAVENOUS

## 2016-10-19 MED ORDER — FENTANYL CITRATE (PF) 100 MCG/2ML IJ SOLN
INTRAMUSCULAR | Status: AC
  Filled 2016-10-19: qty 2

## 2016-10-19 MED ORDER — LIDOCAINE HCL (PF) 1 % IJ SOLN
INTRAMUSCULAR | Status: AC
  Filled 2016-10-19: qty 30

## 2016-10-19 MED ORDER — LIDOCAINE HCL (PF) 1 % IJ SOLN
INTRAMUSCULAR | Status: DC | PRN
  Administered 2016-10-19: 2 mL via SUBCUTANEOUS

## 2016-10-19 MED ORDER — SODIUM CHLORIDE 0.9 % IV SOLN: 250.0000 mL | INTRAVENOUS | Status: DC | PRN

## 2016-10-19 MED ORDER — HEPARIN (PORCINE) IN NACL 2-0.9 UNIT/ML-% IJ SOLN
INTRAMUSCULAR | Status: DC | PRN
  Administered 2016-10-19: 10 mL via INTRA_ARTERIAL

## 2016-10-19 MED ORDER — VERAPAMIL HCL 2.5 MG/ML IV SOLN
INTRAVENOUS | Status: AC
  Filled 2016-10-19: qty 2

## 2016-10-19 MED ORDER — MIDAZOLAM HCL 2 MG/2ML IJ SOLN
INTRAMUSCULAR | Status: AC
  Filled 2016-10-19: qty 2

## 2016-10-19 MED ORDER — CARVEDILOL 12.5 MG PO TABS
12.5000 mg | ORAL_TABLET | Freq: Two times a day (BID) | ORAL | Status: DC
  Administered 2016-10-19 – 2016-10-21 (×4): 12.5 mg via ORAL
  Filled 2016-10-19 (×4): qty 1

## 2016-10-19 MED ORDER — FENTANYL CITRATE (PF) 100 MCG/2ML IJ SOLN
INTRAMUSCULAR | Status: DC | PRN
  Administered 2016-10-19: 25 ug via INTRAVENOUS
  Administered 2016-10-19: 50 ug via INTRAVENOUS
  Administered 2016-10-19: 25 ug via INTRAVENOUS

## 2016-10-19 MED ORDER — ACETAMINOPHEN 325 MG PO TABS
650.0000 mg | ORAL_TABLET | ORAL | Status: DC | PRN
  Administered 2016-10-19: 650 mg via ORAL
  Filled 2016-10-19: qty 2

## 2016-10-19 MED ORDER — IOPAMIDOL (ISOVUE-370) INJECTION 76%
INTRAVENOUS | Status: DC | PRN
Start: 2016-10-19 — End: 2016-10-19
  Administered 2016-10-19: 100 mL via INTRA_ARTERIAL

## 2016-10-19 MED ORDER — HEPARIN (PORCINE) IN NACL 2-0.9 UNIT/ML-% IJ SOLN
INTRAMUSCULAR | Status: AC
  Filled 2016-10-19: qty 1000

## 2016-10-19 MED ORDER — HEPARIN (PORCINE) IN NACL 2-0.9 UNIT/ML-% IJ SOLN
INTRAMUSCULAR | Status: DC | PRN
  Administered 2016-10-19: 1000 mL

## 2016-10-19 MED ORDER — TICAGRELOR 90 MG PO TABS: 90.0000 mg | ORAL_TABLET | Freq: Two times a day (BID) | ORAL | Status: DC

## 2016-10-19 SURGICAL SUPPLY — 22 items
BALLN ANGIOSCULPT RX 3.0X15 (BALLOONS) ×3
BALLN EMERGE MR 2.5X12 (BALLOONS) ×3
BALLN ~~LOC~~ EMERGE MR 4.0X12 (BALLOONS) ×3
BALLOON ANGIOSCULPT RX 3.0X15 (BALLOONS) IMPLANT
BALLOON EMERGE MR 2.5X12 (BALLOONS) IMPLANT
BALLOON ~~LOC~~ EMERGE MR 4.0X12 (BALLOONS) IMPLANT
CATH EXPO 5FR FR4 (CATHETERS) ×2 IMPLANT
CATH EXTRAC PRONTO 5.5F 138CM (CATHETERS) ×2 IMPLANT
CATH OPTICROSS 40MHZ (CATHETERS) ×2 IMPLANT
DEVICE RAD COMP TR BAND LRG (VASCULAR PRODUCTS) ×2 IMPLANT
GLIDESHEATH SLEND SS 6F .021 (SHEATH) ×2 IMPLANT
GUIDEWIRE INQWIRE 1.5J.035X260 (WIRE) IMPLANT
INQWIRE 1.5J .035X260CM (WIRE) ×3
KIT ENCORE 26 ADVANTAGE (KITS) ×2 IMPLANT
KIT HEART LEFT (KITS) ×3 IMPLANT
PACK CARDIAC CATHETERIZATION (CUSTOM PROCEDURE TRAY) ×3 IMPLANT
SLED PULL BACK IVUS (MISCELLANEOUS) ×2 IMPLANT
TRANSDUCER W/STOPCOCK (MISCELLANEOUS) ×3 IMPLANT
TUBING CIL FLEX 10 FLL-RA (TUBING) ×3 IMPLANT
VALVE GUARDIAN II ~~LOC~~ HEMO (MISCELLANEOUS) ×2 IMPLANT
WIRE ASAHI PROWATER 180CM (WIRE) ×2 IMPLANT
WIRE HI TORQ BMW 190CM (WIRE) ×2 IMPLANT

## 2016-10-19 NOTE — H&P (Signed)
History & Physical    Patient ID: Peter Russo MRN: [id number], DOB/AGE: 02-09-1976   Admit date: 10/19/2016   Primary Physician: Jaclyn Shaggy, MD Primary Cardiologist: Eden Emms  Patient Profile    41 yo male with PMH of CAD s/p multiple previous PCIs to LAD and LCx and known CTO to RCA, ICM, HTN, HLD, obesity, GERD and pre diabetes who presented with STEMI.  Past Medical History    Past Medical History:  Diagnosis Date  . Anxiety   . CAD S/P percutaneous coronary angioplasty    a. s/p BMS-mLAD 2010. b. DES to RCA 2012. c. 04/2015: DES to LCx and PCI to LAD c/b dissection with subsequent overlapping DES to mLAD - r/i for NSTEMI afterwards; d. 08/2015 Ant STEMI in setting of noncompliance->111mLAD ISR (DES); e. 03/2016 PCI of apical LAD; f. 03/2016 Relook Cath: patent LAD stents, D1 25, D2 45, LCX 10p/m, RCA 100p CTO-->Med Rx.  . Chronic combined systolic and diastolic CHF (congestive heart failure) (HCC)    a. 03/2016 Echo: EF 30-35%, Gr2 DD  . Depression   . Essential hypertension   . GAD (generalized anxiety disorder)   . GERD (gastroesophageal reflux disease)   . Hypercholesteremia   . Ischemic cardiomyopathy 09/14/2015   a. prior EF 25-35 percent at cath, 35-40 percent by echo; b. 03/2016 Echo: EF 30-35%, diff HK, Gr2 DD.  . Morbid obesity (HCC)   . Pre-diabetes   . ST elevation (STEMI) myocardial infarction involving left anterior descending coronary artery (HCC) 09/14/2015   3.0 x 16 mm Synergy DES to the LAD for ISR BMS  . SVT (supraventricular tachycardia) (HCC) APRROX 10 YRS AGO   PRESUMED AVNRT, ECHO 12/07 WITH EF 65%, MILD LAE  . Tobacco abuse     Past Surgical History:  Procedure Laterality Date  . CARDIAC CATHETERIZATION N/A 04/23/2015   Procedure: Left Heart Cath and Coronary Angiography;  Surgeon: Wendall Stade, MD;  Location: [facility] INVASIVE CV LAB;  Service: Cardiovascular;  Laterality: N/A;  . CARDIAC CATHETERIZATION N/A 04/23/2015   Procedure: Coronary Stent  Intervention;  Surgeon: Tonny Bollman, MD;  Location: [facility] INVASIVE CV LAB;  Service: Cardiovascular;  Laterality: N/A;  . CARDIAC CATHETERIZATION N/A 09/14/2015   Procedure: Left Heart Cath and Coronary Angiography;  Surgeon: Lyn Records, MD;  LAD 100%, CFX 10% ISR, RCA 100% (chronic), EF 25-35%  . CARDIAC CATHETERIZATION N/A 09/14/2015   Procedure: Coronary Stent Intervention;  Surgeon: Lyn Records, MD;  Location: [facility] INVASIVE CV LAB;  Service: Cardiovascular;  Laterality: N/A; 3.0 x 16 mm Synergy DES LAD  . CARDIAC CATHETERIZATION  03/16/2016  . CARDIAC CATHETERIZATION N/A 03/16/2016   Procedure: Left Heart Cath and Coronary Angiography;  Surgeon: Lyn Records, MD;  Location: [facility] INVASIVE CV LAB;  Service: Cardiovascular;  Laterality: N/A;  . CARDIAC CATHETERIZATION N/A 03/16/2016   Procedure: Coronary Balloon Angioplasty;  Surgeon: Lyn Records, MD;  Location: [facility] INVASIVE CV LAB;  Service: Cardiovascular;  Laterality: N/A;  . CARDIAC CATHETERIZATION N/A 03/25/2016   Procedure: Left Heart Cath and Coronary Angiography;  Surgeon: Peter M Swaziland, MD;  Location: [facility] INVASIVE CV LAB;  Service: Cardiovascular;  Laterality: N/A;  . CORONARY ANGIOPLASTY WITH STENT PLACEMENT  2010; 2013     Allergies  No Known Allergies  History of Present Illness    Peter Russo is a 41 yo male with PMH of CAD s/p multiple previous PCIs to LAD and LCx and known CTO to RCA, ICM, HTN, HLD, obesity,  GERD and pre diabetes. He has a long hx of CAD with previous stenting of the LAD that was complicated by in stent restenosis with anterior STEMI in 1/07 requiring repeat intervention.  Most recently underwent cath with intervention to the apical LAD in early 8/17. Known to have chronic occlusion of the RCA with left to right collaterals with medical management. Presented back 03/24/16 with chest pain and peak troponin of 3.38. Underwent cardiac cath showing patent stents with no new culprits for his reported symptoms. Echo at  that time showed EF of 30-35% with diffuse hypokinesis and G2DD. Was maintained on ASA, statin, BB, ACEi, and Brilinta, with Imdur/spironolactone.   He was seen by Dr. Eden Emms 9/17 and reported doing well, along with being compliant with home medications. Sherryll Burger was added to his medications. He was seen by Dr. Graciela Husbands in consideration for possible ICD placement. Planned for outpatient echo showed improved EF to 40%.   Was in his usual state of health today when he developed a sudden onset of centralized chest pain, no radiation or associated symptoms. 7/10, took 2 SL nitro without relief. Called EMS. On arrival he was given 324 ASA and 2 additional SL nitro with improvement in pain to 4/10. EKG showed SR with anteroseptal ST elevation. Also noted to be hypertensive, but improved with SL nitro.   Home Medications    Prior to Admission medications   Medication Sig Start Date End Date Taking? Authorizing Provider  amoxicillin (AMOXIL) 500 MG capsule Take 1 capsule (500 mg total) by mouth 3 (three) times daily. 06/12/16   Hope Orlene Och, NP  aspirin 81 MG EC tablet Take 1 tablet (81 mg total) by mouth daily. 03/17/16   Bhavinkumar Bhagat, PA  atorvastatin (LIPITOR) 80 MG tablet Take 1 tablet (80 mg total) by mouth daily at 6 PM. 09/11/15   Leone Brand, NP  carvedilol (COREG) 12.5 MG tablet Take 1 tablet (12.5 mg total) by mouth 2 (two) times daily. 04/12/16 07/11/16  Duke Salvia, MD  fenofibrate (TRICOR) 48 MG tablet Take 1 tablet (48 mg total) by mouth daily. 07/08/15   Dayna N Dunn, PA-C  HYDROcodone-acetaminophen (NORCO) 5-325 MG tablet Take 1 tablet by mouth every 6 (six) hours as needed for severe pain. 06/19/16   Emi Holes, PA-C  ibuprofen (ADVIL,MOTRIN) 800 MG tablet Take 1 tablet (800 mg total) by mouth 3 (three) times daily. 06/19/16   Emi Holes, PA-C  isosorbide mononitrate (IMDUR) 30 MG 24 hr tablet Take 1 tablet (30 mg total) by mouth daily. 03/26/16   Ok Anis, NP    nitroGLYCERIN (NITROSTAT) 0.4 MG SL tablet Place 0.4 mg under the tongue every 5 (five) minutes as needed for chest pain (3 x).    Historical Provider, MD  sacubitril-valsartan (ENTRESTO) 24-26 MG Take 1 tablet by mouth 2 (two) times daily. 04/12/16   Duke Salvia, MD  sertraline (ZOLOFT) 100 MG tablet Take 1 tablet (100 mg total) by mouth daily. 04/19/16   Jaclyn Shaggy, MD  spironolactone (ALDACTONE) 25 MG tablet Take 1 tablet (25 mg total) by mouth daily. 03/26/16   Ok Anis, NP  ticagrelor (BRILINTA) 90 MG TABS tablet Take 1 tablet (90 mg total) by mouth 2 (two) times daily. 03/31/16   Jaclyn Shaggy, MD    Family History    Family History  Problem Relation Age of Onset  . Arrhythmia Mother     MOTHER LIVED TO BE 89  . Coronary artery disease  Mother   . Heart attack Mother   . Hypertension Mother   . Coronary artery disease Father [id number]  . Heart disease Father   . Heart attack Father   . Heart attack Brother   . Stroke Neg Hx     Social History    Social History   Social History  . Marital status: Single    Spouse name: N/A  . Number of children: N/A  . 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 Smoking status: Former Smoker    Packs/day: 1.00    Years: 24.00    Types: Cigarettes  . Smokeless tobacco: Never Used     Comment: quit on 03/15/16  . Alcohol use No  . Drug use: Yes    Types: Marijuana     Comment: last time 02/2016  . Sexual activity: Yes   Other Topics Concern  . Not on file   Social History Narrative   REGULAR EXERCISE     Review of Systems    General:  No chills, fever, night sweats or weight changes.  Cardiovascular: See HPI Dermatological: No rash, lesions/masses Respiratory: No cough, dyspnea Urologic: No hematuria, dysuria Abdominal:   No nausea, vomiting, diarrhea, bright red blood per rectum, melena, or hematemesis Neurologic:  No visual changes, wkns, changes in  mental status. All other systems reviewed and are otherwise negative except as noted above.  Physical Exam    There were no vitals taken for this visit.  General: Pleasant younger AAM, NAD Psych: Normal affect. Neuro: Alert and oriented X 3. Moves all extremities spontaneously. HEENT: Normal  Neck: Supple without bruits or JVD. Lungs:  Resp regular and unlabored, CTA. Heart: RRR no s3, s4, or murmurs. Abdomen: Soft, non-tender, non-distended, BS + x 4.  Extremities: No clubbing, cyanosis or edema. DP/PT/Radials 2+ and equal bilaterally.  Labs    Troponin (Point of Care Test) No results for input(s): TROPIPOC in the last 72 hours. No results for input(s): CKTOTAL, CKMB, TROPONINI in the last 72 hours. Lab Results  Component Value Date   WBC 11.1 (H) 03/25/2016   HGB 14.1 03/25/2016   HCT 41.6 03/25/2016   MCV 86.5 03/25/2016   PLT 244 03/25/2016   No results for input(s): NA, K, CL, CO2, BUN, CREATININE, CALCIUM, PROT, BILITOT, ALKPHOS, ALT, AST, GLUCOSE in the last 168 hours.  Invalid input(s): LABALBU Lab Results  Component Value Date   CHOL 177 04/05/2016   HDL 29 (L) 04/05/2016   LDLCALC 83 04/05/2016   TRIG 327 (H) 04/05/2016   No results found for: [facility]   Radiology Studies    No results found.  ECG & Cardiac Imaging    EKG: SR with anteroseptal ST elevation  Cath: 8/17  Conclusion     Prox Cx to Mid Cx lesion, 10 %stenosed.  Prox LAD to Dist LAD lesion, 0 %stenosed.  Prox LAD lesion, 0 %stenosed.  Prox RCA lesion, 100 %stenosed.  Dist LAD lesion, 25 %stenosed.  2nd Diag lesion, 45 %stenosed.   1. Single vessel obstructive CAD with CTO of the proximal RCA 2. Continued patency of stents in the LAD and LCx 3. Chronic dissection of second diagonal with TIMI 3 flow.  Plan: continue medical therapy. No clear reason for recent NSTEMI. If patient has refractory angina despite optimal medical therapy could consider CTO PCI of the RCA but patient  is not ideal due to history of noncompliance.  Echo: 1/18  ------------------------------------------------------------------- Study Conclusions  - Left ventricle: LVEF Is approximately 40% with hypokinesis of the   base / mid inerrior / inferoseptal walls akinesis of the distal   inferior/inferoseptal walls and apex.   No significant difference from echo of fall 2017 The cavity size   was moderately dilated. Wall thickness was normal. - Left atrium: The atrium was mildly dilated.  Impressions:  - Poor acoustic windows limit study  Assessment & Plan    41 yo male with PMH of CAD s/p multiple previous PCIs to LAD and LCx and known CTO to RCA, ICM, HTN, HLD, obesity, GERD and pre diabetes who presented with STEMI.  1. STEMI: Developed a sudden onset of centralized chest pain, similar to that he experienced with previous stent placement. Took 2SL nitro without relief. Called EMS. EKG showed anteroseptal ST elevation. He was brought directly to the cath lab for cardiac cath. Further recommendations pending cath -- cycle troponins  2. HTN: Hypertensive on EMS arrival, improved with nitro.  -- continue BB, entresto, spirono  3. HL: on high dose statin, and fenofibrate -- check lipids  4. Pre DM: check HgbA1c   5. ICM: Last echo 1/18 showed improvement in EF to 40%  Signed, Laverda Page, NP-C Pager [phone] 10/19/2016, 12:47 PM   I have examined the patient and reviewed assessment and plan and discussed with patient.  Agree with above as stated.  I personally reviewed the ECG and made the decision to bring him for emergent cath.  He had an occluded LAD opened with PTCA.  He states he takes his medicines.  He states he gets his Brilinta without problems, but he has a prescription bottle from August 2017 that he states he fills with samples from the office.  He appears to have significant instent restenosis with some thrombus.  Increased LVEDP.  He needs aggressive medical  therapy for volume overload and systolic dysfunction.     Lance Muss

## 2016-10-19 NOTE — Progress Notes (Signed)
eLink Physician-Brief Progress Note Patient Name: Peter Russo DOB: Apr 20, 1976 MRN: [id number]   Date of Service  10/19/2016  HPI/Events of Note  stemi Cath done Thrombectomy, angio No distress Some HTN, anti HTN on order  eICU Interventions       Intervention Category Evaluation Type: New Patient Evaluation  Nelda BucksFEINSTEIN,Vendetta Pittinger J. 10/19/2016, 6:22 PM

## 2016-10-20 ENCOUNTER — Other Ambulatory Visit: Payer: Self-pay

## 2016-10-20 ENCOUNTER — Encounter (HOSPITAL_COMMUNITY): Payer: Self-pay | Admitting: *Deleted

## 2016-10-20 DIAGNOSIS — Z9119 Patient's noncompliance with other medical treatment and regimen: Secondary | ICD-10-CM

## 2016-10-20 LAB — BASIC METABOLIC PANEL
Anion gap: 10 (ref 5–15)
BUN: 11 mg/dL (ref 6–20)
CO2: 25 mmol/L (ref 22–32)
Calcium: 8.8 mg/dL — ABNORMAL LOW (ref 8.9–10.3)
Chloride: 104 mmol/L (ref 101–111)
Creatinine, Ser: 1.13 mg/dL (ref 0.61–1.24)
GFR calc Af Amer: >60 mL/min (ref >60)
GFR calc non Af Amer: >60 mL/min (ref >60)
Glucose, Bld: 133 mg/dL — ABNORMAL HIGH (ref 65–99)
Potassium: 3.8 mmol/L (ref 3.5–5.1)
Sodium: 139 mmol/L (ref 135–145)

## 2016-10-20 LAB — CBC
HCT: 39.8 % (ref 39.0–52.0)
Hemoglobin: 13.6 g/dL (ref 13.0–17.0)
MCH: 28.9 pg (ref 26.0–34.0)
MCHC: 34.2 g/dL (ref 30.0–36.0)
MCV: 84.5 fL (ref 78.0–100.0)
Platelets: 206 10*3/uL (ref 150–400)
RBC: 4.71 MIL/uL (ref 4.22–5.81)
RDW: 14 % (ref 11.5–15.5)
WBC: 12 10*3/uL — ABNORMAL HIGH (ref 4.0–10.5)

## 2016-10-20 LAB — HEMOGLOBIN A1C
Hgb A1c MFr Bld: 6.1 % — ABNORMAL HIGH (ref 4.8–5.6)
Mean Plasma Glucose: 128 mg/dL

## 2016-10-20 MED FILL — Tirofiban HCl in NaCl 0.9% IV Soln 5 MG/100ML (Base Equiv): INTRAVENOUS | Qty: 100 | Status: AC

## 2016-10-20 NOTE — Progress Notes (Signed)
Pt transferred to 3e12 at this time.

## 2016-10-20 NOTE — Progress Notes (Signed)
Pt received from 4N via w/c.  Oriented to room. Call bell. Instructed to call for assistance as needed. Will  Continue to monitor.  Amanda PeaNellie Jabreel Chimento, Charity fundraiserN.

## 2016-10-20 NOTE — Progress Notes (Signed)
CARDIAC REHAB PHASE I   PRE:  Rate/Rhythm: 85 SR    BP: sitting 122/62    SaO2: 98 RA  MODE:  Ambulation: 700 ft   POST:  Rate/Rhythm: 102 ST    BP: sitting 126/90     SaO2:   Pt got dressed in bathroom then walked. VSS, tolerated well, no c/o. Long discussion of meds and risk factors (well known to me). He is stating he takes his meds but should have refilled 09/24/16 and he still has some left from [facility]. I'm not sure about his Brilinta as he sts he gets samples. He does not have any statin and sts "they didn't give me any". I reviewed what he has and told him he was getting a text tomorrow. Also discussed ex, diet, CRPII, NTG. Declined MI book. He is scared of another MI but this all seems to be his way of coping. I am not sure of his academic ability. He now has Medicaid so can do CRPII. Will refer to [facility]. Will f/u tomorrow. [phone]   Harriet MassonRandi Kristan Markanthony Gedney CES, ACSM 10/20/2016 3:11 PM

## 2016-10-20 NOTE — Progress Notes (Signed)
[facility] at this [facility].  Pt has no c/o pain or s/s of any acute distress.

## 2016-10-20 NOTE — Progress Notes (Signed)
Patient suddenly  woke up with 10/10 chest pain and started vomiting. Zofran and sublingual nitro given to patient. ekg was done ans results called to oncall physician. Chest pain is relieved now and patient has drifted of into a deep slumber

## 2016-10-20 NOTE — Progress Notes (Signed)
Progress Note  Patient Name: Peter Russo Date of Encounter: 10/20/2016  Primary Cardiologist: Dr. Eden Emms   Subjective   Sleeping. Denies CP. No issues with radial cath sight. Hasn't been out of bed yet. A bit nonchalant regarding questions pertaining to his heath and compliance with meds at home.   Inpatient Medications    Scheduled Meds: . aspirin  81 mg Oral Daily  . atorvastatin  80 mg Oral q1800  . carvedilol  12.5 mg Oral BID  . fenofibrate  54 mg Oral Daily  . isosorbide mononitrate  30 mg Oral Daily  . sertraline  100 mg Oral Daily  . sodium chloride flush  3 mL Intravenous Q12H  . ticagrelor  90 mg Oral BID   Continuous Infusions:  PRN Meds: sodium chloride, acetaminophen, HYDROcodone-acetaminophen, nitroGLYCERIN, ondansetron (ZOFRAN) IV, sodium chloride flush   Vital Signs    Vitals:   10/20/16 0600 10/20/16 0700 10/20/16 0747 10/20/16 0800  BP: 124/82 130/83  112/63  Pulse: 72 63  78  Resp: 20 (!) 25  (!) 23  Temp:   98.8 F (37.1 C)   TempSrc:   Oral   SpO2: 97% 100%  93%  Weight:      Height:        Intake/Output Summary (Last 24 hours) at 10/20/16 0948 Last data filed at 10/20/16 0900  Gross per 24 hour  Intake             1162 ml  Output             1100 ml  Net               62 ml   Filed Weights   10/19/16 1700  Weight: 264 lb 1.8 oz (119.8 kg)    Telemetry    NSR - Personally Reviewed  Physical Exam   GEN: No acute distress.  Moderately obese  Neck: No JVD Cardiac: RRR, no murmurs, rubs, or gallops.  Respiratory: Clear to auscultation bilaterally. GI: Soft, nontender, non-distended  MS: No edema; No deformity. Neuro:  Nonfocal  Psych: Normal affect   Labs    Chemistry Recent Labs Lab 10/19/16 1305 10/19/16 1307 10/20/16 0213  NA 139 141 139  K 3.6 3.7 3.8  CL 106 106 104  CO2 22  --  25  GLUCOSE 137* 144* 133*  BUN 14 15 11   CREATININE 1.11 1.10 1.13  CALCIUM 9.2  --  8.8*  PROT 6.8  --   --   ALBUMIN 3.7   --   --   AST 39  --   --   ALT 38  --   --   ALKPHOS 71  --   --   BILITOT 0.3  --   --   GFRNONAA >60  --  >60  GFRAA >60  --  >60  ANIONGAP 11  --  10     Hematology Recent Labs Lab 10/19/16 1305 10/19/16 1307 10/20/16 0213  WBC 10.0  --  12.0*  RBC 4.70  --  4.71  HGB 13.8 13.9 13.6  HCT 39.8 41.0 39.8  MCV 84.7  --  84.5  MCH 29.4  --  28.9  MCHC 34.7  --  34.2  RDW 14.0  --  14.0  PLT 200  --  206    Cardiac Enzymes Recent Labs Lab 10/19/16 1305  TROPONINI 0.65*   No results for input(s): TROPIPOC in the last 168 hours.   BNPNo results  for input(s): BNP, PROBNP in the last 168 hours.   DDimer No results for input(s): DDIMER in the last 168 hours.   Radiology    No results found.  Cardiac Studies   [facility] 10/19/16 Procedures   Coronary Balloon Angioplasty  Intravascular Ultrasound/IVUS  Left Heart Cath and Coronary Angiography  Conclusion     Prox Cx to Mid Cx lesion, 10 %stenosed.  Prox RCA lesion, 100 %stenosed. Chronic total occlusion.  2nd Diag lesion, 45 %stenosed.  Prox LAD lesion, 100 %stenosed. This was the culprit lesion and treated with thrombectomy and angioplasty, > 4 mm. IVUS showed the vessel diameter at approximaltely 4 mm.  Post intervention, there is a 0% residual stenosis.  There is no aortic valve stenosis.  LV end diastolic pressure is severely elevated.  He needs aggressive risk factor modification. He needs to be compliant with dual antiplatelet therapy. We elected not to place any additional stents since there were questions about compliance. There was inadequate result with just balloon dilatation with intravascular ultrasound guidance. He states he was taking his Brilinta. In the event that he had missed some doses, we gave a dose of intravenous tirofiban. He was again loaded with oral Brilinta. He'll need aggressive medical therapy for his systolic dysfunction. Elevated LVEDP. Hold off on IV fluids at this  point.   Patient Profile     41 yo AAM with PMH of CAD s/p multiple previous PCIs to LAD and LCx and known CTO to RCA, ICM, HTN, HLD, obesity, GERD, pre diabetes as well as h/o medication noncompliance, who presented 10/20/16 with CP and meet criteria for STEMI.  Assessment & Plan    1. CAD/ STEMI: Initial troponin was 0.65. Cyclic troponins not ordered. S/p emergent cath. Culprit lesion found to be a 100% stenosed proximal LAD treated with thrombectomy and angioplasty. Post intervention, there was 0% restenosis. Dr. Eldridge DaceVaranasi elected not to place a stent since there were questions regarding compliance. He was also noted to have CTO of his prox RCA, previously known, 10% prox-mid Cx and 45% 2nd diagonal, to be treated medically. He denies any recurrent CP. Continue ASA, Brilinta, Coreg and Lipitor.     2. Ischemic Cardiomyopathy/ Chronic Systolic HF: EF was as low as 96%35% in August 2017. Repeat echo Jan. 2018 showed slight improvement up to 40%. LVEF not assessed by cath yesterday, however LVEDP was noted to be severely elevated. Continue medical therapy. Continue Coreg. Nelva Nayntreso was listed in home med list but patient not taking. ? Issues with cost. May need to consider $4 lisinopril.    3. HLD: LDL yesterday was 183 mg/dL. TG 313. Total Cholesterol 279 mg/dL. Non compliant with statin at home. Lipitor 80 mg restarted. I stressed the importance of strict compliance. Reassess compliance at time of office f/u. If compliant, recheck FLP and HFTs in 6 weeks.   4. HTN: BP is controlled this morning. Continue Coreg and Imdur.   5. Pre-Diabetes: Hgb A1c this admit 6.1.   6. NSVT: brief run noted on telemetry in the setting of recent STEMI s/p reperfusion of an occluded LAD + reduced EF. Continue BB therapy with Coreg. Monitor on telemetry.   7. Medication Noncompliance: Pt appears to have poor insight regarding his medical conditions. Also a bit nonchalant regarding questions pertaining to his heath and  compliance with meds at home. He brought his home meds with him in a bag. Some of his listed home meds, including Entresto and Lipitor, are not in his bag. Some  pill bottles are empty (spironolactone). I will place consult for case management for medication assistance. If cost is a barrier to Entresto compliance, recommend switching to $4 dollar lisinopril. He may also benefit from a home health RN, for a few weeks, to help with medication assistance at home, which will hopefully help with compliance.   Dispo: We will transfer to Tele today. Ambulate with cardiac rehab. Possible d/c home in the next 24-48 hrs if stable.   Signed, Robbie Lis, PA-C  10/20/2016, 9:48 AM    Personally seen and examined. Agree with above. No complaints currently. No CP. Was resting when I came into room.  RRR, obese, CTAB STEMI of LAD -proximal instent restenosis. NO NEW STENT placed. DAPT needs to be strongly encouraged.  He is at high risk for readmission given prior non compliance.  NSVT noted. Brief. Coreg.  Donato Schultz, MD   OK with transfer to tele.

## 2016-10-21 ENCOUNTER — Telehealth: Payer: Self-pay | Admitting: Cardiovascular Disease

## 2016-10-21 ENCOUNTER — Encounter (HOSPITAL_COMMUNITY): Payer: Self-pay | Admitting: Physician Assistant

## 2016-10-21 MED ORDER — ISOSORBIDE MONONITRATE ER 30 MG PO TB24: 30.0000 mg | ORAL_TABLET | Freq: Every day | ORAL | 6 refills | Status: DC

## 2016-10-21 MED ORDER — CARVEDILOL 12.5 MG PO TABS: 12.5000 mg | ORAL_TABLET | Freq: Two times a day (BID) | ORAL | 6 refills | Status: DC

## 2016-10-21 MED ORDER — ASPIRIN 81 MG PO TBEC: 81.0000 mg | DELAYED_RELEASE_TABLET | Freq: Every day | ORAL | 3 refills | Status: AC

## 2016-10-21 MED ORDER — NITROGLYCERIN 0.4 MG SL SUBL: 0.4000 mg | SUBLINGUAL_TABLET | SUBLINGUAL | 2 refills | Status: DC | PRN

## 2016-10-21 MED ORDER — LOSARTAN POTASSIUM 25 MG PO TABS: 25.0000 mg | ORAL_TABLET | Freq: Every day | ORAL | 11 refills | Status: DC

## 2016-10-21 MED ORDER — FENOFIBRATE 48 MG PO TABS: 48.0000 mg | ORAL_TABLET | Freq: Every day | ORAL | 11 refills | Status: DC

## 2016-10-21 MED ORDER — ATORVASTATIN CALCIUM 80 MG PO TABS: 80.0000 mg | ORAL_TABLET | Freq: Every day | ORAL | 3 refills | Status: DC

## 2016-10-21 MED ORDER — TICAGRELOR 90 MG PO TABS: 90.0000 mg | ORAL_TABLET | Freq: Two times a day (BID) | ORAL | 3 refills | Status: DC

## 2016-10-21 MED ORDER — LOSARTAN POTASSIUM 25 MG PO TABS
25.0000 mg | ORAL_TABLET | Freq: Every day | ORAL | Status: DC
  Administered 2016-10-21: 25 mg via ORAL
  Filled 2016-10-21: qty 1

## 2016-10-21 NOTE — Progress Notes (Signed)
CARDIAC REHAB PHASE I   PRE:  Rate/Rhythm: 71 SR    BP: sitting 115/66    SaO2:   MODE:  Ambulation: 760 ft   POST:  Rate/Rhythm: 91 SR    BP: sitting 144/91     SaO2:   Tolerated well, denied CP/SOB. Feels good. BP is elevated after walk. Pt today admits to struggling with second dose of meds at home (Brilinta and Coreg). We discussed his schedule and different techniques to help him. He feels if he could take most of his meds at 9 pm, it would help him. He feels he wouldn't feel tired and nauseated as he would be asleep. Encouraged him to discuss this with MD[facility]PA or [facility]. Pt then would take his Brilinta and Coreg at around 9 am. We reviewed all of his meds (what I knew of) and what they are for. Good discussion. He is planning on CRPII and increasing exercise.  [phone]   Harriet MassonRandi Kristan Kaitlynd Phillips CES, ACSM 10/21/2016 11:41 AM

## 2016-10-21 NOTE — Care Management Note (Signed)
Case Management Note  Patient Details  Name: Peter Russo MRN: [id number] Date of Birth: 04-05-1976  Subjective/Objective:     Admitted with Acute Anterior Wall MI              Action/Plan: Patient lives at home with his sig other /children; Independent of his ADL's. 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 CM talked to patient at length about taking care of himself and being responsible for his health. Patient stated that he was going to take his health serious and take his medication as ordered.  Expected Discharge Date:   possibly 10/23/2016               Expected Discharge Plan:  Home/Self Care  Discharge planning Services  CM Consult  Status of Service:  In process, will continue to follow  Reola MosherChandler, Jamarea Selner L, RN,MHA,BSN 10/21/2016, 2:22 PM

## 2016-10-21 NOTE — Telephone Encounter (Signed)
TOC Phone Call . Appt is on 11/01/16 at 3pm w/ Cline CrockKathryn Thompson . Thanks

## 2016-10-21 NOTE — Discharge Summary (Signed)
Discharge Summary    Patient ID: Peter Russo,  MRN: [id number], DOB/AGE: Jan 01, 1976 41 y.o.  Admit date: 10/19/2016 Discharge date: 10/21/2016   Primary Care Provider: Jaclyn ShaggyEnobong, Amao Primary Cardiologist: Dr. Eden EmmsNishan  Discharge Diagnoses    Principal Problem:   ST elevation (STEMI) myocardial infarction involving left anterior descending coronary artery [facility](HCC) Active Problems:   Mixed hyperlipidemia   GERD (gastroesophageal reflux disease)   Morbid obesity (HCC)   Pre-diabetes   Tobacco abuse   CAD S/P percutaneous coronary angioplasty   Acute anterior wall MI (HCC)   Allergies No Known Allergies   History of Present Illness     Peter Russo is a 41 yo male with PMH of CAD s/p multiple previous PCIs to LAD and LCx and known CTO to RCA, ICM, HTN, HLD, obesity, GERD and pre diabetes. He has a long hx of CAD with previous stenting of the LAD that was complicated by in stent restenosis with anterior STEMI in 1/07 requiring repeat intervention.  Most recently underwent cath with intervention to the apical LAD in early 8/17. Known to have chronic occlusion of the RCA with left to right collaterals with medical management. Presented back 03/24/16 with chest pain and peak troponin of 3.38. Underwent cardiac cath showing patent stents with no new culprits for his reported symptoms. Echo at that time showed EF of 30-35% with diffuse hypokinesis and G2DD. Was maintained on ASA, statin, BB, ACEi, and Brilinta, with Imdur/spironolactone.   He was seen by Dr. Eden EmmsNishan 9/17 and reported doing well, along with being compliant with home medications. Sherryll Burgerntresto was added to his medications. He was seen by Dr. Graciela HusbandsKlein in consideration for possible ICD placement. Planned for outpatient echo showed improved EF to 40%.   Was in his usual state of health today when he developed a sudden onset of centralized chest pain, no radiation or associated symptoms. 7/10, took 2 SL nitro without relief. Called  EMS. On arrival he was given 324 ASA and 2 additional SL nitro with improvement in pain to 4/10. EKG showed SR with anteroseptal ST elevation. Also noted to be hypertensive, but improved with SL nitro.    Hospital Course     Consultants: none  On arrival, he was taken emergently to the cath lab. Culprit lesion, proximal LAD, was 100% stenosed. Balloon angioplasty alone was performed on the proximal LAD; no stents were placed given questionable medication compliance. He was also noted to have CTO of the RCA, 10% prox-mid Cx and 45% 2nd diagonal. Decision was made to treat him medically.  He was observed overnight with no bleeding at radial cath site. Following balloon angioplasty, he denies chest pain, shortness of breath, and palpitations.   He was discharged on his home ASA and brilinta, lipitor, and coreg He was discharged on losartan and imdur.   Patient seen and examined by Dr. Anne FuSkains today and was stable for discharge. All follow up has been arranged.  _____________  Discharge Vitals Blood pressure 125/62, pulse 69, temperature 98.5 F (36.9 C), temperature source Oral, resp. rate 18, height 5\' 7"  (1.702 m), weight 260 lb 1.6 oz (118 kg), SpO2 96 %.  Filed Weights   10/19/16 1700 10/20/16 1629 10/21/16 0649  Weight: 264 lb 1.8 oz (119.8 kg) 262 lb 11.2 oz (119.2 kg) 260 lb 1.6 oz (118 kg)    Labs & Radiologic Studies    CBC  Recent Labs  10/19/16 1305 10/19/16 1307 10/20/16 0213  WBC 10.0  --  12.0*  HGB 13.8 13.9 13.6  HCT 39.8 41.0 39.8  MCV 84.7  --  84.5  PLT 200  --  206   Basic Metabolic Panel  Recent Labs  10/19/16 1305 10/19/16 1307 10/20/16 0213  NA 139 141 139  K 3.6 3.7 3.8  CL 106 106 104  CO2 22  --  25  GLUCOSE 137* 144* 133*  BUN 14 15 11   CREATININE 1.11 1.10 1.13  CALCIUM 9.2  --  8.8*   Liver Function Tests  Recent Labs  10/19/16 1305  AST 39  ALT 38  ALKPHOS 71  BILITOT 0.3  PROT 6.8  ALBUMIN 3.7   No results for input(s):  LIPASE, AMYLASE in the last 72 hours. Cardiac Enzymes  Recent Labs  10/19/16 1305  TROPONINI 0.65*   BNP Invalid input(s): POCBNP D-Dimer No results for input(s): DDIMER in the last 72 hours. Hemoglobin A1C  Recent Labs  10/19/16 1305  HGBA1C 6.1*   Fasting Lipid Panel  Recent Labs  10/19/16 1312  CHOL 279*  HDL 33*  LDLCALC 183*  TRIG 313*  CHOLHDL 8.5   Thyroid Function Tests No results for input(s): TSH, T4TOTAL, T3FREE, THYROIDAB in the last 72 hours.  Invalid input(s): FREET3 _____________  No results found.   Diagnostic Studies/Procedures    Left Heart Catheterization 10/19/16:  Prox Cx to Mid Cx lesion, 10 %stenosed.  Prox RCA lesion, 100 %stenosed. Chronic total occlusion.  2nd Diag lesion, 45 %stenosed.  Prox LAD lesion, 100 %stenosed. This was the culprit lesion and treated with thrombectomy and angioplasty, > 4 mm. IVUS showed the vessel diameter at approximaltely 4 mm.  Post intervention, there is a 0% residual stenosis.  There is no aortic valve stenosis.  LV end diastolic pressure is severely elevated.   He needs aggressive risk factor modification. He needs to be compliant with dual antiplatelet therapy. We elected not to place any additional stents since there were questions about compliance. There was inadequate result with just balloon dilatation with intravascular ultrasound guidance. He states he was taking his Brilinta. In the event that he had missed some doses, we gave a dose of intravenous tirofiban. He was again loaded with oral Brilinta. He'll need aggressive medical therapy for his systolic dysfunction. Elevated LVEDP. Hold off on IV fluids at this point.    Echocardiogram 09/08/16: Study Conclusions - Left ventricle: LVEF Is approximately 40% with hypokinesis of the   base / mid inerrior / inferoseptal walls akinesis of the distal   inferior/inferoseptal walls and apex.   No significant difference from echo of fall 2017 The  cavity size   was moderately dilated. Wall thickness was normal. - Left atrium: The atrium was mildly dilated.  Impressions: - Poor acoustic windows limit study   Disposition   Pt is being discharged home today in good condition.  Follow-up Plans & Appointments    Follow-up Information    Cline Crock, PA-C Follow up on 11/01/2016.   Specialties:  Cardiology, Radiology Why:  3:00pm  for [facility] and hospital follow up appt Contact information: [address] [place] [address] [phone]          Discharge Instructions    Amb Referral to Cardiac Rehabilitation    Complete by:  As directed    Diagnosis:   PTCA STEMI     Diet - low sodium heart healthy    Complete by:  As directed    Discharge instructions    Complete  by:  As directed    PLEASE REMEMBER TO BRING ALL OF YOUR MEDICATIONS TO EACH OF YOUR FOLLOW-UP OFFICE VISITS.  PLEASE ATTEND ALL SCHEDULED FOLLOW-UP APPOINTMENTS.   Activity: Increase activity slowly as tolerated. You may shower, but no soaking baths (or swimming) for 1 week. You may not return to work until cleared by your cardiologist. No lifting over 10 lbs for 4 weeks. No sexual activity for 4 weeks.   Wound Care: You may wash cath site gently with soap and water. Keep cath site clean and dry. If you notice pain, swelling, bleeding or pus at your cath site, please call [phone].   Increase activity slowly    Complete by:  As directed       Discharge Medications   Current Discharge Medication List    START taking these medications   Details  losartan (COZAAR) 25 MG tablet Take 1 tablet (25 mg total) by mouth daily. Qty: 30 tablet, Refills: 11      CONTINUE these medications which have CHANGED   Details  aspirin 81 MG EC tablet Take 1 tablet (81 mg total) by mouth daily. Qty: 90 tablet, Refills: 3    atorvastatin (LIPITOR) 80 MG tablet Take 1 tablet (80 mg total) by mouth daily at 6 PM. Qty: 90 tablet, Refills: 3      carvedilol (COREG) 12.5 MG tablet Take 1 tablet (12.5 mg total) by mouth 2 (two) times daily. Qty: 60 tablet, Refills: 6    fenofibrate (TRICOR) 48 MG tablet Take 1 tablet (48 mg total) by mouth daily. Qty: 30 tablet, Refills: 11   Associated Diagnoses: Elevated triglycerides with high cholesterol; Medication management    isosorbide mononitrate (IMDUR) 30 MG 24 hr tablet Take 1 tablet (30 mg total) by mouth daily. Qty: 30 tablet, Refills: 6    nitroGLYCERIN (NITROSTAT) 0.4 MG SL tablet Place 1 tablet (0.4 mg total) under the tongue every 5 (five) minutes as needed for chest pain (3 x). Qty: 25 tablet, Refills: 2    ticagrelor (BRILINTA) 90 MG TABS tablet Take 1 tablet (90 mg total) by mouth 2 (two) times daily. Qty: 180 tablet, Refills: 3      CONTINUE these medications which have NOT CHANGED   Details  sertraline (ZOLOFT) 100 MG tablet Take 1 tablet (100 mg total) by mouth daily. Qty: 30 tablet, Refills: 3      STOP taking these medications     spironolactone (ALDACTONE) 25 MG tablet      sacubitril-valsartan (ENTRESTO) 24-26 MG      HYDROcodone-acetaminophen (NORCO) 5-325 MG tablet          Aspirin prescribed at discharge?  Yes High Intensity Statin Prescribed? (Lipitor 40-80mg  or Crestor 20-40mg ): Yes Beta Blocker Prescribed? Yes For EF <40%, was ACEI/ARB Prescribed? Yes ADP Receptor Inhibitor Prescribed? (i.e. Plavix etc.-Includes Medically Managed Patients): Yes For EF <40%, Aldosterone Inhibitor Prescribed? No, EF greater than 40% Was EF assessed during THIS hospitalization? No Was Cardiac Rehab II ordered? (Included Medically managed Patients): Yes   Outstanding Labs/Studies   None  Duration of Discharge Encounter   Greater than 30 minutes including physician time.  Signed, Roe Rutherford Duke PA-C 10/21/2016, 3:47 PM  Personally seen and examined. Agree with above.  1. CAD/ STEMI: Culprit lesion found to be a 100% stenosed proximal LAD treated with  thrombectomy and angioplasty. Post intervention, there was 0% restenosis. Dr. Eldridge Dace elected not to place a stent since there were questions regarding compliance. He was also noted to  have CTO of his prox RCA, previously known, 10% prox-mid Cx and 45% 2nd diagonal, to be treated medically. He denies any recurrent CP. Continue ASA, Brilinta, Coreg and Lipitor.  Troponin 0.65   2. Ischemic Cardiomyopathy/ Chronic Systolic HF: EF was as low as 40% in August 2017. Repeat echo Jan. 2018 showed slight improvement up to 40%. LVEF not assessed by cath yesterday, however LVEDP was noted to be severely elevated. This will be assessed as an outpatient. Continue medical therapy. Continue Coreg. Nelva Nay was listed in home med list but patient not taking. We will give him losartan 25 mg.    3. HLD: LDL 183 mg/dL. TG 313. Total Cholesterol 279 mg/dL. Non compliant with statin at home. Lipitor 80 mg restarted. I stressed the importance of strict compliance. Reassess compliance at time of office f/u. If compliant, recheck FLP and HFTs in 6 weeks.   4. HTN: BP is controlled this morning. Continue Coreg and Imdur.   5. Pre-Diabetes: Hgb A1c this admit 6.1.   6. NSVT: brief run noted on telemetry in the setting of recent STEMI s/p reperfusion of an occluded LAD + reduced EF. Continue BB therapy with Coreg.    7. Medication Noncompliance  Dispo: cardiac rehab.  d/c home. He admits that he has been fairly noncompliant with his medications at times. He is going to try to do better he states. He is quite kind about this.  Signed, Donato Schultz, MD  10/21/2016, 2:14 PM

## 2016-10-21 NOTE — Progress Notes (Signed)
Progress Note  Patient Name: Peter Russo Date of Encounter: 10/21/2016  Primary Cardiologist: Dr. Eden EmmsNishan   Subjective   Sitting up in the chair, no chest pain, no shortness of breath. Feels well, ambulating well  Inpatient Medications    Scheduled Meds: . aspirin  81 mg Oral Daily  . atorvastatin  80 mg Oral q1800  . carvedilol  12.5 mg Oral BID  . fenofibrate  54 mg Oral Daily  . isosorbide mononitrate  30 mg Oral Daily  . sertraline  100 mg Oral Daily  . sodium chloride flush  3 mL Intravenous Q12H  . ticagrelor  90 mg Oral BID   Continuous Infusions:  PRN Meds: sodium chloride, acetaminophen, HYDROcodone-acetaminophen, nitroGLYCERIN, ondansetron (ZOFRAN) IV, sodium chloride flush   Vital Signs    Vitals:   10/21/16 0100 10/21/16 0649 10/21/16 1022 10/21/16 1158  BP: 120/72 126/77 122/75 125/62  Pulse: 72 (!) 1 78 69  Resp: 18 18  18   Temp: 98.4 F (36.9 C) 98.2 F (36.8 C)  98.5 F (36.9 C)  TempSrc: Oral Oral  Oral  SpO2: 96% 99%  96%  Weight:  260 lb 1.6 oz (118 kg)    Height:        Intake/Output Summary (Last 24 hours) at 10/21/16 1414 Last data filed at 10/21/16 0931  Gross per 24 hour  Intake              603 ml  Output                0 ml  Net              603 ml   Filed Weights   10/19/16 1700 10/20/16 1629 10/21/16 0649  Weight: 264 lb 1.8 oz (119.8 kg) 262 lb 11.2 oz (119.2 kg) 260 lb 1.6 oz (118 kg)    Telemetry    NSR - Personally Reviewed  Physical Exam   GEN: No acute distress.  Moderately obese  Neck: No JVD Cardiac: RRR, no murmurs, rubs, or gallops.  Respiratory: Clear to auscultation bilaterally. GI: Soft, nontender, non-distended  MS: No edema; No deformity. Neuro:  Nonfocal  Psych: Normal affect   Labs    Chemistry  Recent Labs Lab 10/19/16 1305 10/19/16 1307 10/20/16 0213  NA 139 141 139  K 3.6 3.7 3.8  CL 106 106 104  CO2 22  --  25  GLUCOSE 137* 144* 133*  BUN 14 15 11   CREATININE 1.11 1.10 1.13    CALCIUM 9.2  --  8.8*  PROT 6.8  --   --   ALBUMIN 3.7  --   --   AST 39  --   --   ALT 38  --   --   ALKPHOS 71  --   --   BILITOT 0.3  --   --   GFRNONAA >60  --  >60  GFRAA >60  --  >60  ANIONGAP 11  --  10     Hematology  Recent Labs Lab 10/19/16 1305 10/19/16 1307 10/20/16 0213  WBC 10.0  --  12.0*  RBC 4.70  --  4.71  HGB 13.8 13.9 13.6  HCT 39.8 41.0 39.8  MCV 84.7  --  84.5  MCH 29.4  --  28.9  MCHC 34.7  --  34.2  RDW 14.0  --  14.0  PLT 200  --  206    Cardiac Enzymes  Recent Labs Lab 10/19/16 1305  TROPONINI 0.65*   No results for input(s): TROPIPOC in the last 168 hours.   BNPNo results for input(s): BNP, PROBNP in the last 168 hours.   DDimer No results for input(s): DDIMER in the last 168 hours.   Radiology    No results found.  Cardiac Studies   [facility] 10/19/16 Procedures   Coronary Balloon Angioplasty  Intravascular Ultrasound/IVUS  Left Heart Cath and Coronary Angiography  Conclusion     Prox Cx to Mid Cx lesion, 10 %stenosed.  Prox RCA lesion, 100 %stenosed. Chronic total occlusion.  2nd Diag lesion, 45 %stenosed.  Prox LAD lesion, 100 %stenosed. This was the culprit lesion and treated with thrombectomy and angioplasty, > 4 mm. IVUS showed the vessel diameter at approximaltely 4 mm.  Post intervention, there is a 0% residual stenosis.  There is no aortic valve stenosis.  LV end diastolic pressure is severely elevated.  He needs aggressive risk factor modification. He needs to be compliant with dual antiplatelet therapy. We elected not to place any additional stents since there were questions about compliance. There was inadequate result with just balloon dilatation with intravascular ultrasound guidance. He states he was taking his Brilinta. In the event that he had missed some doses, we gave a dose of intravenous tirofiban. He was again loaded with oral Brilinta. He'll need aggressive medical therapy for his systolic dysfunction.  Elevated LVEDP. Hold off on IV fluids at this point.   Patient Profile     41 yo AAM with PMH of CAD s/p multiple previous PCIs to LAD and LCx and known CTO to RCA, ICM, HTN, HLD, obesity, GERD, pre diabetes as well as h/o medication noncompliance, who presented 10/20/16 with CP and meet criteria for STEMI.  Assessment & Plan    1. CAD/ STEMI: Culprit lesion found to be a 100% stenosed proximal LAD treated with thrombectomy and angioplasty. Post intervention, there was 0% restenosis. Dr. Eldridge Dace elected not to place a stent since there were questions regarding compliance. He was also noted to have CTO of his prox RCA, previously known, 10% prox-mid Cx and 45% 2nd diagonal, to be treated medically. He denies any recurrent CP. Continue ASA, Brilinta, Coreg and Lipitor.  Troponin 0.65   2. Ischemic Cardiomyopathy/ Chronic Systolic HF: EF was as low as 16% in August 2017. Repeat echo Jan. 2018 showed slight improvement up to 40%. LVEF not assessed by cath yesterday, however LVEDP was noted to be severely elevated. This will be assessed as an outpatient. Continue medical therapy. Continue Coreg. Nelva Nay was listed in home med list but patient not taking. We will give him losartan 25 mg.    3. HLD: LDL 183 mg/dL. TG 313. Total Cholesterol 279 mg/dL. Non compliant with statin at home. Lipitor 80 mg restarted. I stressed the importance of strict compliance. Reassess compliance at time of office f/u. If compliant, recheck FLP and HFTs in 6 weeks.   4. HTN: BP is controlled this morning. Continue Coreg and Imdur.   5. Pre-Diabetes: Hgb A1c this admit 6.1.   6. NSVT: brief run noted on telemetry in the setting of recent STEMI s/p reperfusion of an occluded LAD + reduced EF. Continue BB therapy with Coreg.    7. Medication Noncompliance  Dispo: cardiac rehab.  d/c home. He admits that he has been fairly noncompliant with his medications at times. He is going to try to do better he states. He is quite kind  about this.  Signed, Donato Schultz, MD  10/21/2016, 2:14  PM

## 2016-10-21 NOTE — Progress Notes (Signed)
At 1812 all d/c instructions explained ang given to pt.  Money collected from security and given to pt.  Pt counted and sign his paper verifying correct amount. D/c off floor via ambulatory.  Declined w/c service.   Amanda PeaNellie Karson Chicas, Charity fundraiserN.

## 2016-10-24 MED FILL — BRILINTA 90 MG TABLET: 90 | 30 days supply | Qty: 60 | Fill #0 | Status: TO

## 2016-10-24 NOTE — Telephone Encounter (Signed)
Follow Up: ° ° ° °Returning your call from this morning. °

## 2016-10-24 NOTE — Telephone Encounter (Signed)
Patient contacted [facility] on 10/21/16.  Patient understands to follow up with provider Bary CastillaKaty Thompson on 11/01/16 [facility] Patient understands discharge instructions? yes Patient understands medications and regiment? yes Patient understands to bring all medications to this visit? yes

## 2016-10-24 NOTE — Telephone Encounter (Signed)
Left a message for the pt to call back.  

## 2016-10-31 NOTE — Progress Notes (Signed)
Cardiology Office Note    Date:  11/01/2016   ID:  Peter Russo, DOB 24-Apr-1976, MRN [id number]  PCP:  Jaclyn Shaggy, MD  Cardiologist:  Dr. Eden Emms / Dr. Graciela Husbands (EP)  CC: post hosp fu - STEMI   History of Present Illness:  Peter Russo is a 41 y.o. male with a history of CAD s/p multiple previous PCIs to LAD and LCx and known CTO to RCA, ICM, HTN, HLD, obesity, GERD and pre diabetes who presents to clinic for post hospital follow up after recent admission for STEMI.   He has a long hx of CAD with previous stenting of the LAD that was complicated by in stent restenosis with anterior STEMI in 1/07 requiring repeat intervention.He represented with chest pain in 03/16/2016 and underwent cath with intervention to the apical LAD. He then presented back 03/24/16 with chest pain and peak troponin of 3.38. Underwent cardiac cath showing patent stents with no new culprits for his reported symptoms. Echo at that time showed EF of 30-35% with diffuse hypokinesis and G2DD. Was maintained on ASA, statin, BB, ACEi, and Brilinta, with Imdur/spironolactone.   He was seen by Dr. Eden Emms 9/17 and reported doing well, along with being compliant with home medications. Peter Russo was added to his medications. He was seen by Dr. Graciela Husbands in consideration for possible ICD placement but repeat echo showed improved EF to 40%.   He was recently admitted to [facility] 3/7-10/21/16 for anterior STEMI. On arrival, he was taken emergently to the cath lab. Culprit lesion, proximal LAD, was 100% stenosed. Balloon angioplasty alone was performed on the proximal LAD; no stents were placed given questionable medication compliance. He was also noted to have CTO of the RCA, 10% prox-mid Cx and 45% 2nd diagonal. Decision was made to treat him medically. He was continued on ASA/Brilitna, lipitor, coreg, losartan and imdur. Per discharge summary Entresto and spironolactone were discontinued.   Today he presents to clinic for follow up. He  brings in his bag of medicine today and doesn't have any medicine except ASA, Brilinta and Coreg. He has empty bottles of Zoloft, imdur, entresto and atorvastatin. He has no clue what he is supposed to be taking. He has not had any chest pain but always has SOB. He requests a handicap sticker. No LE edema. He has chronic orthopnea and intermittent PND. Still smoking intermittently. Emotional today and starts to cry. Feels overwhelmed with his own health issues and family history. His father passed in his 45s and he feels like he is on the same path. Has medicaid now and willing to participate in cardiac rehab.    Past Medical History:  Diagnosis Date  . Anxiety   . CAD S/P percutaneous coronary angioplasty    a. s/p BMS-mLAD 2010. b. DES to RCA 2012. c. 04/2015: DES to LCx and PCI to LAD c/b dissection with subsequent overlapping DES to mLAD - r/i for NSTEMI afterwards; d. 08/2015 Ant STEMI in setting of noncompliance->132mLAD ISR (DES); e. 03/2016 PCI of apical LAD; f. 03/2016 Relook Cath: patent LAD stents, D1 25, D2 45, LCX 10p/m, RCA 100p CTO-->Med Rx.  . Chronic combined systolic and diastolic CHF (congestive heart failure) (HCC)    a. 03/2016 Echo: EF 30-35%, Gr2 DD  . Depression   . Essential hypertension   . GAD (generalized anxiety disorder)   . GERD (gastroesophageal reflux disease)   . Hypercholesteremia   . Ischemic cardiomyopathy 09/14/2015   a. prior EF 25-35 percent at cath, 35-40  percent by echo; b. 03/2016 Echo: EF 30-35%, diff HK, Gr2 DD.  . Morbid obesity (HCC)   . Pre-diabetes   . ST elevation (STEMI) myocardial infarction involving left anterior descending coronary artery (HCC) 09/14/2015   3.0 x 16 mm Synergy DES to the LAD for ISR BMS  . STEMI involving left anterior descending coronary artery (HCC) 10/19/2016   balloon angioplasty to proximal LAD, no PCI  . SVT (supraventricular tachycardia) (HCC) APRROX 10 YRS AGO   PRESUMED AVNRT, ECHO 12/07 WITH EF 65%, MILD LAE  .  Tobacco abuse     Past Surgical History:  Procedure Laterality Date  . CARDIAC CATHETERIZATION N/A 04/23/2015   Procedure: Left Heart Cath and Coronary Angiography;  Surgeon: Wendall StadePeter C Nishan, MD;  [facility];  Service: Cardiovascular;  Laterality: N/A;  . CARDIAC CATHETERIZATION N/A 04/23/2015   Procedure: Coronary Stent Intervention;  Surgeon: Tonny BollmanMichael Cooper, MD;  [facility];  Service: Cardiovascular;  Laterality: N/A;  . CARDIAC CATHETERIZATION N/A 09/14/2015   Procedure: Left Heart Cath and Coronary Angiography;  Surgeon: Lyn RecordsHenry W Smith, MD;  LAD 100%, CFX 10% ISR, RCA 100% (chronic), EF 25-35%  . CARDIAC CATHETERIZATION N/A 09/14/2015   Procedure: Coronary Stent Intervention;  Surgeon: Lyn RecordsHenry W Smith, MD;  [facility];  Service: Cardiovascular;  Laterality: N/A; 3.0 x 16 mm Synergy DES LAD  . CARDIAC CATHETERIZATION  03/16/2016  . CARDIAC CATHETERIZATION N/A 03/16/2016   Procedure: Left Heart Cath and Coronary Angiography;  Surgeon: Lyn RecordsHenry W Smith, MD;  [facility];  Service: Cardiovascular;  Laterality: N/A;  . CARDIAC CATHETERIZATION N/A 03/16/2016   Procedure: Coronary Balloon Angioplasty;  Surgeon: Lyn RecordsHenry W Smith, MD;  [facility];  Service: Cardiovascular;  Laterality: N/A;  . CARDIAC CATHETERIZATION N/A 03/25/2016   Procedure: Left Heart Cath and Coronary Angiography;  Surgeon: Peter M SwazilandJordan, MD;  [facility];  Service: Cardiovascular;  Laterality: N/A;  . CORONARY ANGIOPLASTY WITH STENT PLACEMENT  2010; 2013  . CORONARY BALLOON ANGIOPLASTY N/A 10/19/2016   Procedure: Coronary Balloon Angioplasty;  Surgeon: Corky CraftsJayadeep S Varanasi, MD;  [facility];  Service: Cardiovascular;  Laterality: N/A;  . INTRAVASCULAR ULTRASOUND/IVUS N/A 10/19/2016   Procedure: Intravascular Ultrasound/IVUS;  Surgeon: Corky CraftsJayadeep S Varanasi, MD;  [facility];  Service: Cardiovascular;  Laterality:  N/A;  . LEFT HEART CATH AND CORONARY ANGIOGRAPHY N/A 10/19/2016   Procedure: Left Heart Cath and Coronary Angiography;  Surgeon: Corky CraftsJayadeep S Varanasi, MD;  [facility];  Service: Cardiovascular;  Laterality: N/A;    Current Medications: Outpatient Medications Prior to Visit  Medication Sig Dispense Refill  . aspirin 81 MG EC tablet Take 1 tablet (81 mg total) by mouth daily. 90 tablet 3  . atorvastatin (LIPITOR) 80 MG tablet Take 1 tablet (80 mg total) by mouth daily at 6 PM. 90 tablet 3  . carvedilol (COREG) 12.5 MG tablet Take 1 tablet (12.5 mg total) by mouth 2 (two) times daily. 60 tablet 6  . fenofibrate (TRICOR) 48 MG tablet Take 1 tablet (48 mg total) by mouth daily. 30 tablet 11  . isosorbide mononitrate (IMDUR) 30 MG 24 hr tablet Take 1 tablet (30 mg total) by mouth daily. 30 tablet 6  . losartan (COZAAR) 25 MG tablet Take 1 tablet (25 mg total) by mouth daily. 30 tablet 11  . nitroGLYCERIN (NITROSTAT) 0.4 MG SL tablet Place 1 tablet (0.4 mg  total) under the tongue every 5 (five) minutes as needed for chest pain (3 x). 25 tablet 2  . sertraline (ZOLOFT) 100 MG tablet Take 1 tablet (100 mg total) by mouth daily. 30 tablet 3  . ticagrelor (BRILINTA) 90 MG TABS tablet Take 1 tablet (90 mg total) by mouth 2 (two) times daily. 180 tablet 3   No facility-administered medications prior to visit.      Allergies:   Patient has no known allergies.   Social History   Social History  . Marital status: Single    Spouse name: N/A  . Number of children: N/A  . 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 Smoking status: Former Smoker    Packs/day: 1.00    Years: 24.00    Types: Cigarettes  . Smokeless tobacco: Never Used     Comment: quit on 03/15/16  . Alcohol use No  . Drug use: Yes    Types: Marijuana     Comment: last time 02/2016  . Sexual activity: Yes   Other Topics Concern  . None   Social  History Narrative   REGULAR EXERCISE     Family History:  The patient's family history includes Arrhythmia in his mother; Coronary artery disease in his mother; Coronary artery disease (age of onset: 22) in his father; Heart attack in his brother, father, and mother; Heart disease in his father; Hypertension in his mother.      ROS:   Please see the history of present illness.    ROS All other systems reviewed and are negative.   PHYSICAL EXAM:   VS:  Ht 5\' 7"  (1.702 m)   Wt 275 lb 12.8 oz (125.1 kg)   BMI 43.20 kg/m    GEN: Well nourished, well developed, in no acute distress  HEENT: normal  Neck: no JVD, carotid bruits, or masses Cardiac: RRR; no murmurs, rubs, or gallops,no edema  Respiratory:  clear to auscultation bilaterally, normal work of breathing GI: soft, nontender, nondistended, + BS MS: no deformity or atrophy  Skin: warm and dry, no rash Neuro:  Alert and Oriented x 3, Strength and sensation are intact Psych: euthymic mood, full affect   Wt Readings from Last 3 Encounters:  11/01/16 275 lb 12.8 oz (125.1 kg)  10/21/16 260 lb 1.6 oz (118 kg)  08/17/16 272 lb 6.4 oz (123.6 kg)      Studies/Labs Reviewed:   EKG:  EKG is NOT ordered today.  Recent Labs: 03/17/2016: Magnesium 1.8 10/19/2016: ALT 38 10/20/2016: BUN 11; Creatinine, Ser 1.13; Hemoglobin 13.6; Platelets 206; Potassium 3.8; Sodium 139   Lipid Panel    Component Value Date/Time   CHOL 279 (H) 10/19/2016 1312   TRIG 313 (H) 10/19/2016 1312   HDL 33 (L) 10/19/2016 1312   CHOLHDL 8.5 10/19/2016 1312   VLDL 63 (H) 10/19/2016 1312   LDLCALC 183 (H) 10/19/2016 1312    Additional studies/ records that were reviewed today include:  2D Echocardiogram8.11.2017 Study Conclusions - Left ventricle: The cavity size was normal. Wall thickness was increased in a pattern of mild LVH. Systolic function was moderately to severely reduced. The estimated ejection fraction was in the range of 30% to 35%.  Diffuse hypokinesis. There is akinesis of the apical myocardium. Features are consistent with a pseudonormal left ventricular filling pattern, with concomitant abnormal relaxation and increased filling pressure (grade 2 diastolic dysfunction). Impressions: - Technically difficult; definity used;  diffuse hypokinesis with apical akinesis; overall moderate to severe LV dysfunction; grade 2 diastolic dysfunction; indeterminant LV filling pressure. _____________  Cardiac Catheterization8.11.2017  Coronary Findings  Dominance: Right  Left Anterior Descending  Prox LAD lesion, 0% stenosed. Prox LAD lesion with no stenosis was previously treated.  Prox LAD to Dist LAD lesion, 0% stenosed. Prox LAD to Dist LAD lesion with no stenosis was previously treated.  Dist LAD lesion, 25% stenosed.  First Diagonal Branch  Vessel is small in size.  Second Diagonal Branch  2nd Diag lesion, 45% stenosed. The lesion is dissected.  Left Circumflex  Prox Cx to Mid Cx lesion, 10% stenosed. The lesion was previously treated.  Third Obtuse Marginal Branch  Vessel is small in size.  Right Coronary Artery  Dist RCA filled by collaterals from Prox RCA.  Prox RCA lesion, 100% stenosed.   Coronary Diagrams     _____________  10/19/16 Coronary Balloon Angioplasty  Intravascular Ultrasound/IVUS  Left Heart Cath and Coronary Angiography  Conclusion     Prox Cx to Mid Cx lesion, 10 %stenosed.  Prox RCA lesion, 100 %stenosed. Chronic total occlusion.  2nd Diag lesion, 45 %stenosed.  Prox LAD lesion, 100 %stenosed. This was the culprit lesion and treated with thrombectomy and angioplasty, > 4 mm. IVUS showed the vessel diameter at approximaltely 4 mm.  Post intervention, there is a 0% residual stenosis.  There is no aortic valve stenosis.  LV end diastolic pressure is severely elevated.   He needs aggressive risk factor modification. He needs to be compliant with dual antiplatelet  therapy. We elected not to place any additional stents since there were questions about compliance. There was inadequate result with just balloon dilatation with intravascular ultrasound guidance. He states he was taking his Brilinta. In the event that he had missed some doses, we gave a dose of intravenous tirofiban. He was again loaded with oral Brilinta. He'll need aggressive medical therapy for his systolic dysfunction. Elevated LVEDP. Hold off on IV fluids at this point.  He will be watched in the CCU.     ASSESSMENT & PLAN:   CAD: with recent anterior STEMI. Emergent cath showed 100% prox LAD occlusion s/p thrombectomy and PCTA. Dr. Eldridge Dace elected not to place any additional stents since there were questions about compliance. He was continued on ASA/Brilnta, atorvastatin 80mg  daily, Coreg 12.5mg  BID and imdur 30mg  daily. He is out of imdur, Coreg and atorvastatin. [facility] pharmacy said he didn't have any. Fortunately, he has been taking ASA and Brilinta. I have prescribed all of these medications and have asked him to go the pharmacy today to pick them up. He will come back to see me in 2 weeks and bring all of his medications again.  Ischemic CM: EF 40 % in 08/2016 and ICD placement deferred. He was on [employer]; however he ran out of [place]. At discharge Entresto and spiro were discontinued and he was restarted on Losartan and continued on Coreg. I have asked him to pick up Losartan today and continue on Coreg.  HTN: BP elevated today, but he does not have any of his losartan or imdur. Will have him pick these up and see me back in 2 weeks.  HLD: LDL 183 on most recent lipid panel. He has not been taking his atorvastatin 80mg  dialy. He will pick up today  Obesity: Body mass index is 43.2 kg/m. Weight loss recommended  Pre diabetes: Hg A1c 6.1. Counseled on importance of diet and exercise.  Tobacco abuse: he still smokes a few times a week when he gets stressed.    Depression: patient broke down in tears today and fears he will be like his mother and father who are very sick. His mother was recently admitted for CHF and his father died in early 7s from a MI. I was very frank that he is heading down that path currently and that without strict compliance with medication, lifestyle modifications with diet and exercise and complete smoking cessation he may have similar outcomes. He was previously on Zoloft but ran out. I have prescribed Wellbutrin in the hopes that it may increase energy and help him quit smoking completely.   I filled out handicap parking paper work for him, but I have told him that eventually I want him to be walking more. He now has medicaid and is willing to participate in cardiac rehab.    Total time spent with patient was over 40 minutes which included evaluating patient, reviewing record and coordinating care. Face to face time >50%. It took me at least 20 minutes to go through all of his medications and figure out what he is taking and what he isn't and what he should be on. I spent a lot of time counseling him on the importance of compliance and going through all of his medications and what they are for.     Medication Adjustments/Labs and Tests Ordered: Current medicines are reviewed at length with the patient today.  Concerns regarding medicines are outlined above.  Medication changes, Labs and Tests ordered today are listed in the Patient Instructions below. There are no Patient Instructions on file for this visit.   Signed, Cline Crock, PA-C  11/01/2016 3:26 PM    [facility] [address] [place], [place], [place]  [address] Phone: [phone]; Fax: [phone]

## 2016-11-01 ENCOUNTER — Ambulatory Visit (INDEPENDENT_AMBULATORY_CARE_PROVIDER_SITE_OTHER): Payer: Medicaid Other | Admitting: Physician Assistant

## 2016-11-01 ENCOUNTER — Encounter: Payer: Self-pay | Admitting: Physician Assistant

## 2016-11-01 ENCOUNTER — Encounter (INDEPENDENT_AMBULATORY_CARE_PROVIDER_SITE_OTHER): Payer: Self-pay

## 2016-11-01 VITALS — BP 162/84 | HR 97 | Ht 67.0 in | Wt 275.8 lb

## 2016-11-01 DIAGNOSIS — Z79899 Other long term (current) drug therapy: Secondary | ICD-10-CM

## 2016-11-01 DIAGNOSIS — I5042 Chronic combined systolic (congestive) and diastolic (congestive) heart failure: Secondary | ICD-10-CM | POA: Diagnosis not present

## 2016-11-01 DIAGNOSIS — I2102 ST elevation (STEMI) myocardial infarction involving left anterior descending coronary artery: Secondary | ICD-10-CM | POA: Diagnosis not present

## 2016-11-01 DIAGNOSIS — E782 Mixed hyperlipidemia: Secondary | ICD-10-CM | POA: Diagnosis not present

## 2016-11-01 DIAGNOSIS — I255 Ischemic cardiomyopathy: Secondary | ICD-10-CM | POA: Diagnosis not present

## 2016-11-01 DIAGNOSIS — E785 Hyperlipidemia, unspecified: Secondary | ICD-10-CM

## 2016-11-01 MED ORDER — ATORVASTATIN CALCIUM 80 MG PO TABS: 80.0000 mg | ORAL_TABLET | Freq: Every day | ORAL | 3 refills | Status: DC

## 2016-11-01 MED ORDER — BUPROPION HCL ER (SR) 150 MG PO TB12: 150.0000 mg | ORAL_TABLET | Freq: Two times a day (BID) | ORAL | 11 refills | Status: DC

## 2016-11-01 MED ORDER — FENOFIBRATE 48 MG PO TABS: 48.0000 mg | ORAL_TABLET | Freq: Every day | ORAL | 11 refills | Status: DC

## 2016-11-01 MED ORDER — ISOSORBIDE MONONITRATE ER 30 MG PO TB24: 30.0000 mg | ORAL_TABLET | Freq: Every day | ORAL | 6 refills | Status: DC

## 2016-11-01 MED ORDER — CARVEDILOL 12.5 MG PO TABS: 12.5000 mg | ORAL_TABLET | Freq: Two times a day (BID) | ORAL | 6 refills | Status: DC

## 2016-11-01 MED ORDER — LOSARTAN POTASSIUM 25 MG PO TABS: 25.0000 mg | ORAL_TABLET | Freq: Every day | ORAL | 11 refills | Status: DC

## 2016-11-01 MED FILL — SERTRALINE HCL 100 MG TAB: 100 | 30 days supply | Qty: 30 | Fill #1 | Status: TO

## 2016-11-01 MED FILL — ATORVASTATIN 80 MG TABLET: 80 | 30 days supply | Qty: 30 | Fill #0

## 2016-11-01 MED FILL — SPIRONOLACTONE 25 MG TABLET: 25 | 30 days supply | Qty: 30 | Fill #2

## 2016-11-01 MED FILL — ISOSORBIDE MN ER 30 MG TAB: 30 | 30 days supply | Qty: 30 | Fill #2

## 2016-11-01 MED FILL — FENOFIBRATE 48 MG TABLET: 48 | 30 days supply | Qty: 30 | Fill #0

## 2016-11-01 MED FILL — CARVEDILOL 12.5 MG TABLET: 12.5 | 30 days supply | Qty: 60 | Fill #0

## 2016-11-01 NOTE — Patient Instructions (Addendum)
Medication Instructions:  Your physician has recommended you make the following change in your medication:  1.  STOP the Zoloft 2.  START the Wellbutrin SR 150 mg taking 1 tablet twice a day    Labwork: None ordered  Testing/Procedures: None ordered  Follow-Up: Your physician recommends that you schedule a follow-up appointment in: 2 WEEKS WITH KATIE THOMPSON, PA-C BRING ALL YOUR MEDICATIONS WITH YOU TO THIS APPOINTMENT   Any Other Special Instructions Will Be Listed Below (If Applicable).   If you need a refill on your cardiac medications before your next appointment, please call your pharmacy.

## 2016-11-02 ENCOUNTER — Telehealth: Payer: Self-pay | Admitting: Physician Assistant

## 2016-11-02 MED FILL — BUPROPION SR 150 MG TABLET: 150 | 30 days supply | Qty: 60 | Fill #0 | Status: TO

## 2016-11-02 MED FILL — LOSARTAN POTASSIUM 25 MG TA: 25 | 30 days supply | Qty: 30 | Fill #0

## 2016-11-02 NOTE — Telephone Encounter (Signed)
I spoke to patient in regards to medication questions.  He states when he picked his medication up from the pharmacy they filled sertraline and spironolactone.  He states he did not think he was supposed to be taking those medications. I advised him according to yesterday's OV w/ Cline CrockKathryn Thompson, PA-C sertraline was dc'd and Wellbutrin was rx'd, spironolactone was dc'd at hospital discharge.  At that point our call was disconnected.

## 2016-11-02 NOTE — Telephone Encounter (Signed)
Patient called back. He apologized for the disconnection.  He states he did not think he was supposed to be taking sertraline or spironolactone, but wanted to verify.  I advised him not to take those medications. I reviewed patient's medication list. He voiced understanding and agreed with plan.

## 2016-11-02 NOTE — Telephone Encounter (Signed)
New message   Pt is asking for call from RN about his medications.

## 2016-11-04 ENCOUNTER — Encounter: Payer: Self-pay | Admitting: Physician Assistant

## 2016-11-11 ENCOUNTER — Inpatient Hospital Stay (HOSPITAL_COMMUNITY)
Admission: EM | Admit: 2016-11-11 | Discharge: 2016-11-13 | DRG: 281 | Disposition: A | Discharge status: Home / Self Care | Payer: Medicare Other | Attending: Cardiology | Admitting: Cardiology

## 2016-11-11 ENCOUNTER — Encounter (HOSPITAL_COMMUNITY): Payer: Self-pay

## 2016-11-11 ENCOUNTER — Emergency Department (HOSPITAL_COMMUNITY): Payer: Medicare Other

## 2016-11-11 DIAGNOSIS — Z9114 Patient's other noncompliance with medication regimen: Secondary | ICD-10-CM

## 2016-11-11 DIAGNOSIS — I2510 Atherosclerotic heart disease of native coronary artery without angina pectoris: Secondary | ICD-10-CM | POA: Diagnosis present

## 2016-11-11 DIAGNOSIS — Z720 Tobacco use: Secondary | ICD-10-CM | POA: Diagnosis present

## 2016-11-11 DIAGNOSIS — I214 Non-ST elevation (NSTEMI) myocardial infarction: Secondary | ICD-10-CM | POA: Diagnosis present

## 2016-11-11 DIAGNOSIS — I2109 ST elevation (STEMI) myocardial infarction involving other coronary artery of anterior wall: Secondary | ICD-10-CM | POA: Diagnosis present

## 2016-11-11 DIAGNOSIS — R079 Chest pain, unspecified: Secondary | ICD-10-CM | POA: Diagnosis not present

## 2016-11-11 DIAGNOSIS — E785 Hyperlipidemia, unspecified: Secondary | ICD-10-CM | POA: Diagnosis present

## 2016-11-11 DIAGNOSIS — K219 Gastro-esophageal reflux disease without esophagitis: Secondary | ICD-10-CM | POA: Diagnosis present

## 2016-11-11 DIAGNOSIS — Z91148 Patient's other noncompliance with medication regimen for other reason: Secondary | ICD-10-CM

## 2016-11-11 DIAGNOSIS — Z6841 Body Mass Index (BMI) 40.0 and over, adult: Secondary | ICD-10-CM | POA: Diagnosis not present

## 2016-11-11 DIAGNOSIS — N179 Acute kidney failure, unspecified: Secondary | ICD-10-CM | POA: Diagnosis present

## 2016-11-11 DIAGNOSIS — I10 Essential (primary) hypertension: Secondary | ICD-10-CM | POA: Diagnosis present

## 2016-11-11 DIAGNOSIS — I252 Old myocardial infarction: Secondary | ICD-10-CM

## 2016-11-11 DIAGNOSIS — R7303 Prediabetes: Secondary | ICD-10-CM | POA: Diagnosis present

## 2016-11-11 DIAGNOSIS — F32A Depression, unspecified: Secondary | ICD-10-CM | POA: Diagnosis present

## 2016-11-11 DIAGNOSIS — I209 Angina pectoris, unspecified: Secondary | ICD-10-CM | POA: Diagnosis present

## 2016-11-11 DIAGNOSIS — Z8679 Personal history of other diseases of the circulatory system: Secondary | ICD-10-CM

## 2016-11-11 DIAGNOSIS — I222 Subsequent non-ST elevation (NSTEMI) myocardial infarction: Secondary | ICD-10-CM | POA: Diagnosis not present

## 2016-11-11 DIAGNOSIS — I255 Ischemic cardiomyopathy: Secondary | ICD-10-CM | POA: Diagnosis present

## 2016-11-11 DIAGNOSIS — Z87891 Personal history of nicotine dependence: Secondary | ICD-10-CM

## 2016-11-11 DIAGNOSIS — E7800 Pure hypercholesterolemia, unspecified: Secondary | ICD-10-CM | POA: Diagnosis present

## 2016-11-11 DIAGNOSIS — I5042 Chronic combined systolic (congestive) and diastolic (congestive) heart failure: Secondary | ICD-10-CM | POA: Diagnosis not present

## 2016-11-11 DIAGNOSIS — Z9861 Coronary angioplasty status: Secondary | ICD-10-CM

## 2016-11-11 DIAGNOSIS — I5043 Acute on chronic combined systolic (congestive) and diastolic (congestive) heart failure: Secondary | ICD-10-CM | POA: Diagnosis present

## 2016-11-11 DIAGNOSIS — F419 Anxiety disorder, unspecified: Secondary | ICD-10-CM | POA: Diagnosis not present

## 2016-11-11 DIAGNOSIS — F329 Major depressive disorder, single episode, unspecified: Secondary | ICD-10-CM | POA: Diagnosis present

## 2016-11-11 DIAGNOSIS — I110 Hypertensive heart disease with heart failure: Secondary | ICD-10-CM | POA: Diagnosis present

## 2016-11-11 LAB — BASIC METABOLIC PANEL
Anion gap: 9 (ref 5–15)
BUN: 9 mg/dL (ref 6–20)
CO2: 24 mmol/L (ref 22–32)
Calcium: 9 mg/dL (ref 8.9–10.3)
Chloride: 106 mmol/L (ref 101–111)
Creatinine, Ser: 1.29 mg/dL — ABNORMAL HIGH (ref 0.61–1.24)
GFR calc Af Amer: >60 mL/min (ref >60)
GFR calc non Af Amer: >60 mL/min (ref >60)
Glucose, Bld: 154 mg/dL — ABNORMAL HIGH (ref 65–99)
Potassium: 3.6 mmol/L (ref 3.5–5.1)
Sodium: 139 mmol/L (ref 135–145)

## 2016-11-11 LAB — BRAIN NATRIURETIC PEPTIDE: B Natriuretic Peptide: 190 pg/mL — ABNORMAL HIGH (ref 0.0–100.0)

## 2016-11-11 LAB — CBC
HCT: 37.8 % — ABNORMAL LOW (ref 39.0–52.0)
Hemoglobin: 12.8 g/dL — ABNORMAL LOW (ref 13.0–17.0)
MCH: 28.8 pg (ref 26.0–34.0)
MCHC: 33.9 g/dL (ref 30.0–36.0)
MCV: 85.1 fL (ref 78.0–100.0)
Platelets: 213 10*3/uL (ref 150–400)
RBC: 4.44 MIL/uL (ref 4.22–5.81)
RDW: 14.1 % (ref 11.5–15.5)
WBC: 11.5 10*3/uL — ABNORMAL HIGH (ref 4.0–10.5)

## 2016-11-11 LAB — I-STAT TROPONIN, ED: Troponin i, poc: 0.03 ng/mL (ref 0.00–0.08)

## 2016-11-11 NOTE — H&P (Signed)
History and Physical   PCP:  Arnoldo Morale, MD     Cardiologist:  Dr. Johnsie Cancel / Dr. Caryl Comes (EP)  Chief Complaint: Chest Pain  HPI: Peter Russo is a 41 y.o. male with a history of CAD s/p multiple previous PCIs to LAD and LCx and known CTO to RCA, ICM, HTN, HLD, obesity, GERD and pre diabetes who presents to the ED with chest pain.  He has extensive CAD history including anterior STEMI with multiple subsequent LAD interventions.  He was recently admitted to [facility] 3/7-10/21/16 for anterior STEMI. On arrival, he was taken emergently to the cath lab. Culprit lesion, proximal LAD, was 100% stenosed. Balloon angioplasty alone was performed on the proximal LAD; no stents were placed given questionable medication compliance. He was also noted to have CTO of the RCA, 10% prox-mid Cx and 45% 2nd diagonal. Decision was made to treat him medically. He was continued on ASA/Brilitna, lipitor, coreg, losartan and imdur. Per discharge summary Entresto and spironolactone were discontinued.  He was seen in follow up 11/01/16 by Angelena Form who noted the patient was doing well and ensured compliance.  Prior to ED presentation; he was sitting on his cough at 8 PM and noted acute onset chest pressure.  Pain did not abate with 1 SL NTG.  The second SL NTG improved his symptoms, but it did not resolve it and he came to the ED.  By the time he arrived, his symptoms had resolved without other medications.  On interview, pain free.  No acute change to ECG.  Initial troponin is negative.  Labs notable for CRT elevation, BNP pending.    Otherwise, he notes that he hasn't had issues with chest pain since discharge and these symptoms reminded him of his previous symptoms prior to recent anterior event.  He has stable SOB which hasn't changed.  No edema, orthopnea, PND, bleeding/bruising.  Right wrist has healed well post cath without issues.  He ensures that he has been compliant with all medications since discharge,  especially Brillinta and ASA.  Prior Studies 2D Echocardiogram8.11.2017 Study Conclusions - Left ventricle: The cavity size was normal. Wall thickness was increased in a pattern of mild LVH. Systolic function was moderately to severely reduced. The estimated ejection fraction was in the range of 30% to 35%. Diffuse hypokinesis. There is akinesis of the apical myocardium. Features are consistent with a pseudonormal left ventricular filling pattern, with concomitant abnormal relaxation and increased filling pressure (grade 2 diastolic dysfunction). Impressions: - Technically difficult; definity used; diffuse hypokinesis with apical akinesis; overall moderate to severe LV dysfunction; grade 2 diastolic dysfunction; indeterminant LV filling pressure. _____________  Cardiac Catheterization8.11.2017  Coronary Findings  Dominance: Right  Left Anterior Descending  Prox LAD lesion, 0% stenosed. Prox LAD lesion with no stenosis was previously treated.  Prox LAD to Dist LAD lesion, 0% stenosed. Prox LAD to Dist LAD lesion with no stenosis was previously treated.  Dist LAD lesion, 25% stenosed.  First Diagonal Branch  Vessel is small in size.  Second Diagonal Branch  2nd Diag lesion, 45% stenosed. The lesion is dissected.  Left Circumflex  Prox Cx to Mid Cx lesion, 10% stenosed. The lesion was previously treated.  Third Obtuse Marginal Branch  Vessel is small in size.  Right Coronary Artery  Dist RCA filled by collaterals from Prox RCA.  Prox RCA lesion, 100% stenosed.   Coronary Diagrams     _____________  10/19/16 Coronary Balloon Angioplasty  Intravascular Ultrasound/IVUS  Left Heart Cath  and Coronary Angiography  Conclusion     Prox Cx to Mid Cx lesion, 10 %stenosed.  Prox RCA lesion, 100 %stenosed. Chronic total occlusion.  2nd Diag lesion, 45 %stenosed.  Prox LAD lesion, 100 %stenosed. This was the culprit lesion and treated with  thrombectomy and angioplasty, > 4 mm. IVUS showed the vessel diameter at approximaltely 4 mm.  Post intervention, there is a 0% residual stenosis.  There is no aortic valve stenosis.  LV end diastolic pressure is severely elevated.  He needs aggressive risk factor modification. He needs to be compliant with dual antiplatelet therapy. We elected not to place any additional stents since there were questions about compliance. There was inadequate result with just balloon dilatation with intravascular ultrasound guidance. He states he was taking his Brilinta. In the event that he had missed some doses, we gave a dose of intravenous tirofiban. He was again loaded with oral Brilinta. He'll need aggressive medical therapy for his systolic dysfunction. Elevated LVEDP. Hold off on IV fluids at this point.    Past Medical History:  Diagnosis Date  . Anxiety   . CAD S/P percutaneous coronary angioplasty    a. s/p BMS-mLAD 2010. b. DES to RCA 2012. c. 04/2015: DES to LCx and PCI to LAD c/b dissection with subsequent overlapping DES to mLAD - r/i for NSTEMI afterwards; d. 08/2015 Ant STEMI in setting of noncompliance->182mAD ISR (DES); e. 03/2016 PCI of apical LAD; f. 03/2016 Relook Cath: patent LAD stents, D1 25, D2 45, LCX 10p/m, RCA 100p CTO-->Med Rx.  . Chronic combined systolic and diastolic CHF (congestive heart failure) ([facility]    a. 03/2016 Echo: EF 30-35%, Gr2 DD  . Depression   . Essential hypertension   . GAD (generalized anxiety disorder)   . GERD (gastroesophageal reflux disease)   . Hypercholesteremia   . Ischemic cardiomyopathy 09/14/2015   a. prior EF 25-35 percent at cath, 35-40 percent by echo; b. 03/2016 Echo: EF 30-35%, diff HK, Gr2 DD.  . Morbid obesity ([facility]   . Pre-diabetes   . ST elevation (STEMI) myocardial infarction involving left anterior descending coronary artery ([facility] 09/14/2015   3.0 x 16 mm Synergy DES to the LAD for ISR BMS  . STEMI involving left anterior descending  coronary artery ([facility] 10/19/2016   balloon angioplasty to proximal LAD, no PCI  . SVT (supraventricular tachycardia) (HCC) APRROX 10 YRS AGO   PRESUMED AVNRT, ECHO 12/07 WITH EF 65%, MILD LAE  . Tobacco abuse     Past Surgical History:  Procedure Laterality Date  . CARDIAC CATHETERIZATION N/A 04/23/2015   Procedure: Left Heart Cath and Coronary Angiography;  Surgeon: PJosue Hector MD;  [facility];  Service: Cardiovascular;  Laterality: N/A;  . CARDIAC CATHETERIZATION N/A 04/23/2015   Procedure: Coronary Stent Intervention;  Surgeon: MSherren Mocha MD;  [facility];  Service: Cardiovascular;  Laterality: N/A;  . CARDIAC CATHETERIZATION N/A 09/14/2015   Procedure: Left Heart Cath and Coronary Angiography;  Surgeon: HBelva Crome MD;  LAD 100%, CFX 10% ISR, RCA 100% (chronic), EF 25-35%  . CARDIAC CATHETERIZATION N/A 09/14/2015   Procedure: Coronary Stent Intervention;  Surgeon: HBelva Crome MD;  [facility];  Service: Cardiovascular;  Laterality: N/A; 3.0 x 16 mm Synergy DES LAD  . CARDIAC CATHETERIZATION  03/16/2016  . CARDIAC CATHETERIZATION N/A 03/16/2016   Procedure: Left Heart Cath and Coronary Angiography;  Surgeon: HBelva Crome MD;  [facility];  Service: Cardiovascular;  Laterality: N/A;  . CARDIAC CATHETERIZATION N/A 03/16/2016   Procedure: Coronary Balloon Angioplasty;  Surgeon: Belva Crome, MD;  Location: [place] [facility];  Service: Cardiovascular;  Laterality: N/A;  . CARDIAC CATHETERIZATION N/A 03/25/2016   Procedure: Left Heart Cath and Coronary Angiography;  Surgeon: Peter M Martinique, MD;  Location: [place] [facility];  Service: Cardiovascular;  Laterality: N/A;  . CORONARY ANGIOPLASTY WITH STENT PLACEMENT  2010; 2013  . CORONARY BALLOON ANGIOPLASTY N/A 10/19/2016   Procedure: Coronary Balloon Angioplasty;  Surgeon: Jettie Booze, MD;  Location: [place] [facility];  Service: Cardiovascular;  Laterality:  N/A;  . INTRAVASCULAR ULTRASOUND/IVUS N/A 10/19/2016   Procedure: Intravascular Ultrasound/IVUS;  Surgeon: Jettie Booze, MD;  Location: [place] [facility];  Service: Cardiovascular;  Laterality: N/A;  . LEFT HEART CATH AND CORONARY ANGIOGRAPHY N/A 10/19/2016   Procedure: Left Heart Cath and Coronary Angiography;  Surgeon: Jettie Booze, MD;  Location: [place] [facility];  Service: Cardiovascular;  Laterality: N/A;    Family History  Problem Relation Age of Onset  . Arrhythmia Mother     MOTHER LIVED TO BE 89  . Coronary artery disease Mother   . Heart attack Mother   . Hypertension Mother   . Coronary artery disease Father 7  . Heart disease Father   . Heart attack Father   . Heart attack Brother   . Stroke Neg Hx    Social History:  reports that he has quit smoking. His smoking use included Cigarettes. He has a 24.00 pack-year smoking history. He has never used smokeless tobacco. He reports that he uses drugs, including Marijuana. He reports that he does not drink alcohol.  Allergies: No Known Allergies  No current facility-administered medications on file prior to encounter.    Current Outpatient Prescriptions on File Prior to Encounter  Medication Sig Dispense Refill  . aspirin 81 MG EC tablet Take 1 tablet (81 mg total) by mouth daily. 90 tablet 3  . atorvastatin (LIPITOR) 80 MG tablet Take 1 tablet (80 mg total) by mouth daily at 6 PM. 90 tablet 3  . buPROPion (WELLBUTRIN SR) 150 MG 12 hr tablet Take 1 tablet (150 mg total) by mouth 2 (two) times daily. 60 tablet 11  . carvedilol (COREG) 12.5 MG tablet Take 1 tablet (12.5 mg total) by mouth 2 (two) times daily. 60 tablet 6  . fenofibrate (TRICOR) 48 MG tablet Take 1 tablet (48 mg total) by mouth daily. 30 tablet 11  . isosorbide mononitrate (IMDUR) 30 MG 24 hr tablet Take 1 tablet (30 mg total) by mouth daily. 30 tablet 6  . losartan (COZAAR) 25 MG tablet Take 1 tablet (25 mg total) by mouth daily. 30 tablet 11  .  nitroGLYCERIN (NITROSTAT) 0.4 MG SL tablet Place 1 tablet (0.4 mg total) under the tongue every 5 (five) minutes as needed for chest pain (3 x). 25 tablet 2  . ticagrelor (BRILINTA) 90 MG TABS tablet Take 1 tablet (90 mg total) by mouth 2 (two) times daily. 180 tablet 3    @medshecduled @ @medinfusions @  Results for orders placed or performed during the hospital encounter of 11/11/16 (from the past 48 hour(s))  Basic metabolic panel     Status: Abnormal   Collection Time: 11/11/16  9:27 PM  Result Value Ref Range   Sodium 139 135 - 145 mmol/L   Potassium 3.6 3.5 - 5.1 mmol/L   Chloride 106 101 - 111 mmol/L  CO2 24 22 - 32 mmol/L   Glucose, Bld 154 (H) 65 - 99 mg/dL   BUN 9 6 - 20 mg/dL   Creatinine, Ser 1.29 (H) 0.61 - 1.24 mg/dL   Calcium 9.0 8.9 - 10.3 mg/dL   GFR calc non Af Amer >60 >60 mL/min   GFR calc Af Amer >60 >60 mL/min    Comment: (NOTE) The eGFR has been calculated using the CKD EPI equation. This calculation has not been validated in all clinical situations. eGFR's persistently <60 mL/min signify possible Chronic Kidney Disease.    Anion gap 9 5 - 15  CBC     Status: Abnormal   Collection Time: 11/11/16  9:27 PM  Result Value Ref Range   WBC 11.5 (H) 4.0 - 10.5 K/uL   RBC 4.44 4.22 - 5.81 MIL/uL   Hemoglobin 12.8 (L) 13.0 - 17.0 g/dL   HCT 37.8 (L) 39.0 - 52.0 %   MCV 85.1 78.0 - 100.0 fL   MCH 28.8 26.0 - 34.0 pg   MCHC 33.9 30.0 - 36.0 g/dL   RDW 14.1 11.5 - 15.5 %   Platelets 213 150 - 400 K/uL  I-stat troponin, ED     Status: None   Collection Time: 11/11/16  9:43 PM  Result Value Ref Range   Troponin i, poc 0.03 0.00 - 0.08 ng/mL   Comment 3            Comment: Due to the release kinetics of cTnI, a negative result within the first hours of the onset of symptoms does not rule out myocardial infarction with certainty. If myocardial infarction is still suspected, repeat the test at appropriate intervals.    Dg Chest 2 View  Result Date:  11/11/2016 CLINICAL DATA:  Acute onset of left-sided chest pain, radiating to the left jaw. Shortness of breath. Initial encounter. EXAM: CHEST  2 VIEW COMPARISON:  Chest radiograph performed 03/24/2016 FINDINGS: The lungs are well-aerated. Vascular congestion is noted. Increased interstitial markings raise concern for pulmonary edema. There is no evidence of pleural effusion or pneumothorax. The heart is normal in size; the mediastinal contour is within normal limits. No acute osseous abnormalities are seen. IMPRESSION: Vascular congestion. Increased interstitial markings raise concern for pulmonary edema. Electronically Signed   By: Garald Balding M.D.   On: 11/11/2016 21:57    ECG/Tele: NSR with poor R wave progression unchanged from prior RAD  ROS: As above. Otherwise, review of systems is negative unless per above HPI  Vitals:   11/11/16 2129  BP: (!) 149/80  Pulse: 86  Resp: 18  Temp: 98.6 F (37 C)  TempSrc: Oral  SpO2: 95%   Wt Readings from Last 10 Encounters:  11/01/16 125.1 kg (275 lb 12.8 oz)  10/21/16 118 kg (260 lb 1.6 oz)  08/17/16 123.6 kg (272 lb 6.4 oz)  06/12/16 119.3 kg (263 lb)  04/25/16 121.1 kg (267 lb)  04/19/16 120.7 kg (266 lb 3.2 oz)  04/12/16 119.9 kg (264 lb 6.4 oz)  04/05/16 119.9 kg (264 lb 6.4 oz)  03/30/16 121.3 kg (267 lb 6.4 oz)  03/26/16 119 kg (262 lb 6.4 oz)    PE:  General: No acute distress HEENT: Atraumatic, EOMI, mucous membranes moist CV: RRR no murmurs, gallops. No JVD at 45 degrees. No HJR. Respiratory: Clear, no crackles. Normal work of breathing ABD: Non-distended and non-tender. No palpable organomegaly.  Extremities: 2+ radial pulses bilaterally. No lower extremity edema. Neuro/Psych: CN grossly intact, alert and oriented  Assessment/Plan 41 YO with extensive CAD history and multiple LAD interventions presenting with classic concerning chest pain story, possibly reflective of worsening obstructive CAD.  HEART score 5, warrants  observation, serial troponin, and possible ischemic w/u pending clinical course. Currently chest pain free.   Chest Pain/CAD: Recent anterior STEMi with 100% prox LAD occlusion s/p thrombectomy and PCTA, no stent due to compliance concerns. He appears to be fully compliant with ASA/Brillinta mor recently.   - Cycle troponin, AM ECG. If repeat symptoms or elevated troponin, will start ACS heparin. - Further need for ischemic evaluation pending symptoms, labs, course.  - Continue ASA/Brilnta, atorvastatin 40m daily, Coreg 12.544mBID and imdur 309maily.   Chronic stable systolic and diastolic CHF: EF 40 % in 09/08/4923  Euvolemic.  Hold AM Losartan until repeat CRT given mild elevation.  Continue BB; restart ARB pending repeat CRt.  Mild AKI: repeat AM CRT, losartan on hold pending this  HTN: - Continue above medications  HLD: - LDL >180s recently, continue statin  Pre diabetes: Not treated with medications  Tobacco abuse: Advised cessation  Depression: Continue wellbutrin    GreLolita Cramans  MD 11/11/2016, 11:43 PM

## 2016-11-11 NOTE — ED Provider Notes (Signed)
[facility]-EMERGENCY DEPT Provider Note   CSN: [id number] Arrival date & time: 11/11/16  2121     History   Chief Complaint Chief Complaint  Patient presents with  . Chest Pain    HPI Peter Russo is a 41 y.o. male.  Patient is a 41 year old male with extensive past cardiac history including multiple MIs with stents, ischemic cardiomyopathy, congestive heart failure. He presents today for evaluation of chest pain. He was just admitted earlier this month for acute MI with 100% occlusion of the LAD. This was treated with angioplasty. He reports developing chest pain earlier this evening approximately 2 hours prior to arrival. He described this as a pressure in the front of his chest that radiates up into his neck. He felt short of breath and nauseated. He tells me that these symptoms are similar to his prior heart pains. He took 2 nitroglycerin with little relief, then presents here to be evaluated.   The history is provided by the patient.  Chest Pain   This is a recurrent problem. The current episode started 1 to 2 hours ago. The problem occurs constantly. The problem has not changed since onset.The pain is moderate. The pain radiates to the left neck. He has tried nitroglycerin for the symptoms. The treatment provided mild relief.    Past Medical History:  Diagnosis Date  . Anxiety   . CAD S/P percutaneous coronary angioplasty    a. s/p BMS-mLAD 2010. b. DES to RCA 2012. c. 04/2015: DES to LCx and PCI to LAD c/b dissection with subsequent overlapping DES to mLAD - r/i for NSTEMI afterwards; d. 08/2015 Ant STEMI in setting of noncompliance->175mLAD ISR (DES); e. 03/2016 PCI of apical LAD; f. 03/2016 Relook Cath: patent LAD stents, D1 25, D2 45, LCX 10p/m, RCA 100p CTO-->Med Rx.  . Chronic combined systolic and diastolic CHF (congestive heart failure) (HCC)    a. 03/2016 Echo: EF 30-35%, Gr2 DD  . Depression   . Essential hypertension   . GAD (generalized anxiety disorder)   . GERD  (gastroesophageal reflux disease)   . Hypercholesteremia   . Ischemic cardiomyopathy 09/14/2015   a. prior EF 25-35 percent at cath, 35-40 percent by echo; b. 03/2016 Echo: EF 30-35%, diff HK, Gr2 DD.  . Morbid obesity (HCC)   . Pre-diabetes   . ST elevation (STEMI) myocardial infarction involving left anterior descending coronary artery (HCC) 09/14/2015   3.0 x 16 mm Synergy DES to the LAD for ISR BMS  . STEMI involving left anterior descending coronary artery (HCC) 10/19/2016   balloon angioplasty to proximal LAD, no PCI  . SVT (supraventricular tachycardia) (HCC) APRROX 10 YRS AGO   PRESUMED AVNRT, ECHO 12/07 WITH EF 65%, MILD LAE  . Tobacco abuse     Patient Active Problem List   Diagnosis Date Noted  . Acute ST elevation myocardial infarction (STEMI) of anterior wall (HCC) 10/19/2016  . Acute anterior wall MI (HCC)   . Chronic combined systolic and diastolic CHF (congestive heart failure) (HCC)   . Hypercholesteremia   . Chest pain with high risk for cardiac etiology 03/24/2016  . Coronary artery disease due to lipid rich plaque   . Hypertensive heart disease without CHF   . Hyperlipidemia   . Ischemic cardiomyopathy   . H/O medication noncompliance   . Chest pain with moderate risk for cardiac etiology 03/16/2016  . ACS (acute coronary syndrome) (HCC) 03/16/2016  . Chest pain 03/16/2016  . Coronary artery disease involving native coronary artery of  native heart with angina pectoris with documented spasm (HCC)   . Cardiomyopathy, ischemic 09/22/2015  . ST elevation (STEMI) myocardial infarction involving left anterior descending coronary artery (HCC) 09/14/2015  . Ischemic chest pain (HCC)   . Morbid obesity (HCC)   . Pre-diabetes   . Tobacco abuse   . CAD S/P percutaneous coronary angioplasty   . Essential hypertension   . Cellulitis of hand 05/27/2015  . 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 05/26/2015  . NSTEMI (non-ST elevated myocardial infarction) (HCC)  04/24/2015  . Depression 12/26/2014  . GAD (generalized anxiety disorder) 12/26/2014  . GERD (gastroesophageal reflux disease) 03/01/2013  . Mixed hyperlipidemia 03/14/2011    Past Surgical History:  Procedure Laterality Date  . CARDIAC CATHETERIZATION N/A 04/23/2015   Procedure: Left Heart Cath and Coronary Angiography;  Surgeon: Wendall Stade, MD;  Location: [facility] INVASIVE CV LAB;  Service: Cardiovascular;  Laterality: N/A;  . CARDIAC CATHETERIZATION N/A 04/23/2015   Procedure: Coronary Stent Intervention;  Surgeon: Tonny Bollman, MD;  Location: [facility] INVASIVE CV LAB;  Service: Cardiovascular;  Laterality: N/A;  . CARDIAC CATHETERIZATION N/A 09/14/2015   Procedure: Left Heart Cath and Coronary Angiography;  Surgeon: Lyn Records, MD;  LAD 100%, CFX 10% ISR, RCA 100% (chronic), EF 25-35%  . CARDIAC CATHETERIZATION N/A 09/14/2015   Procedure: Coronary Stent Intervention;  Surgeon: Lyn Records, MD;  Location: [facility] INVASIVE CV LAB;  Service: Cardiovascular;  Laterality: N/A; 3.0 x 16 mm Synergy DES LAD  . CARDIAC CATHETERIZATION  03/16/2016  . CARDIAC CATHETERIZATION N/A 03/16/2016   Procedure: Left Heart Cath and Coronary Angiography;  Surgeon: Lyn Records, MD;  Location: [facility] INVASIVE CV LAB;  Service: Cardiovascular;  Laterality: N/A;  . CARDIAC CATHETERIZATION N/A 03/16/2016   Procedure: Coronary Balloon Angioplasty;  Surgeon: Lyn Records, MD;  Location: [facility] INVASIVE CV LAB;  Service: Cardiovascular;  Laterality: N/A;  . CARDIAC CATHETERIZATION N/A 03/25/2016   Procedure: Left Heart Cath and Coronary Angiography;  Surgeon: Peter M Swaziland, MD;  Location: [facility] INVASIVE CV LAB;  Service: Cardiovascular;  Laterality: N/A;  . CORONARY ANGIOPLASTY WITH STENT PLACEMENT  2010; 2013  . CORONARY BALLOON ANGIOPLASTY N/A 10/19/2016   Procedure: Coronary Balloon Angioplasty;  Surgeon: Corky Crafts, MD;  Location: [facility] INVASIVE CV LAB;  Service: Cardiovascular;  Laterality: N/A;  . INTRAVASCULAR ULTRASOUND/IVUS  N/A 10/19/2016   Procedure: Intravascular Ultrasound/IVUS;  Surgeon: Corky Crafts, MD;  Location: [facility] INVASIVE CV LAB;  Service: Cardiovascular;  Laterality: N/A;  . LEFT HEART CATH AND CORONARY ANGIOGRAPHY N/A 10/19/2016   Procedure: Left Heart Cath and Coronary Angiography;  Surgeon: Corky Crafts, MD;  Location: [facility] INVASIVE CV LAB;  Service: Cardiovascular;  Laterality: N/A;       Home Medications    Prior to Admission medications   Medication Sig Start Date End Date Taking? Authorizing Provider  aspirin 81 MG EC tablet Take 1 tablet (81 mg total) by mouth daily. 10/21/16   Marcelino Duster, PA  atorvastatin (LIPITOR) 80 MG tablet Take 1 tablet (80 mg total) by mouth daily at 6 PM. 11/01/16   Janetta Hora, PA-C  buPROPion [facility] SR) 150 MG 12 hr tablet Take 1 tablet (150 mg total) by mouth 2 (two) times daily. 11/01/16   Janetta Hora, PA-C  carvedilol (COREG) 12.5 MG tablet Take 1 tablet (12.5 mg total) by mouth 2 (two) times daily. 11/01/16 01/30/17  Janetta Hora, PA-C  fenofibrate (TRICOR) 48 MG tablet Take 1  tablet (48 mg total) by mouth daily. 11/01/16   Janetta Hora, PA-C  isosorbide mononitrate (IMDUR) 30 MG 24 hr tablet Take 1 tablet (30 mg total) by mouth daily. 11/01/16   Janetta Hora, PA-C  losartan (COZAAR) 25 MG tablet Take 1 tablet (25 mg total) by mouth daily. 11/01/16   Janetta Hora, PA-C  nitroGLYCERIN (NITROSTAT) 0.4 MG SL tablet Place 1 tablet (0.4 mg total) under the tongue every 5 (five) minutes as needed for chest pain (3 x). 10/21/16   Roe Rutherford Duke, PA  ticagrelor (BRILINTA) 90 MG TABS tablet Take 1 tablet (90 mg total) by mouth 2 (two) times daily. 10/21/16   Marcelino Duster, PA    Family History Family History  Problem Relation Age of Onset  . Arrhythmia Mother     MOTHER LIVED TO BE 89  . Coronary artery disease Mother   . Heart attack Mother   . Hypertension Mother   . Coronary artery disease Father 32  .  Heart disease Father   . Heart attack Father   . Heart attack Brother   . Stroke Neg Hx     Social History Social History  Substance Use Topics  . Smoking status: Former Smoker    Packs/day: 1.00    Years: 24.00    Types: Cigarettes  . Smokeless tobacco: Never Used     Comment: quit on 03/15/16  . Alcohol use No     Allergies   Patient has no known allergies.   Review of Systems Review of Systems  Cardiovascular: Positive for chest pain.  All other systems reviewed and are negative.    Physical Exam Updated Vital Signs BP (!) 149/80   Pulse 86   Temp 98.6 F (37 C) (Oral)   Resp 18   SpO2 95%   Physical Exam  Constitutional: He is oriented to person, place, and time. He appears well-developed and well-nourished. No distress.  HENT:  Head: Normocephalic and atraumatic.  Mouth/Throat: Oropharynx is clear and moist.  Neck: Normal range of motion. Neck supple.  Cardiovascular: Normal rate and regular rhythm.  Exam reveals no friction rub.   No murmur heard. Pulmonary/Chest: Effort normal and breath sounds normal. No respiratory distress. He has no wheezes. He has no rales.  Abdominal: Soft. Bowel sounds are normal. He exhibits no distension. There is no tenderness.  Musculoskeletal: Normal range of motion. He exhibits no edema.  Neurological: He is alert and oriented to person, place, and time. Coordination normal.  Skin: Skin is warm and dry. He is not diaphoretic.  Nursing note and vitals reviewed.    ED Treatments / Results  Labs (all labs ordered are listed, but only abnormal results are displayed) Labs Reviewed  BASIC METABOLIC PANEL - Abnormal; Notable for the following:       Result Value   Glucose, Bld 154 (*)    Creatinine, Ser 1.29 (*)    All other components within normal limits  CBC - Abnormal; Notable for the following:    WBC 11.5 (*)    Hemoglobin 12.8 (*)    HCT 37.8 (*)    All other components within normal limits  I-STAT TROPOININ, ED      EKG  EKG Interpretation  Date/Time:  Friday November 11 2016 21:27:51 EDT Ventricular Rate:  79 PR Interval:  152 QRS Duration: 96 QT Interval:  388 QTC Calculation: 444 R Axis:   100 Text Interpretation:  Normal sinus rhythm Possible Left atrial enlargement  Rightward axis Anteroseptal infarct , age undetermined Abnormal ECG Confirmed by Breton Berns  MD, Tomasita Beevers ([id number]) on 11/11/2016 11:02:17 PM       Radiology Dg Chest 2 View  Result Date: 11/11/2016 CLINICAL DATA:  Acute onset of left-sided chest pain, radiating to the left jaw. Shortness of breath. Initial encounter. EXAM: CHEST  2 VIEW COMPARISON:  Chest radiograph performed 03/24/2016 FINDINGS: The lungs are well-aerated. Vascular congestion is noted. Increased interstitial markings raise concern for pulmonary edema. There is no evidence of pleural effusion or pneumothorax. The heart is normal in size; the mediastinal contour is within normal limits. No acute osseous abnormalities are seen. IMPRESSION: Vascular congestion. Increased interstitial markings raise concern for pulmonary edema. Electronically Signed   By: Roanna Raider M.D.   On: 11/11/2016 21:57    Procedures Procedures (including critical care time)  Medications Ordered in ED Medications - No data to display   Initial Impression / Assessment and Plan / ED Course  I have reviewed the triage vital signs and the nursing notes.  Pertinent labs & imaging results that were available during my care of the patient were reviewed by me and considered in my medical decision making (see chart for details).  Patient with extensive cardiac history presents with chest pain. His workup reveals no EKG changes and negative initial troponin. I've discussed this patient with Dr. means from cardiology who will evaluate patient in the ER and determine the final disposition.  Final Clinical Impressions(s) / ED Diagnoses   Final diagnoses:  None    New Prescriptions New Prescriptions    No medications on file     Geoffery Lyons, MD 11/11/16 2327

## 2016-11-11 NOTE — ED Triage Notes (Signed)
Pt complaining L sided chest pain that radiates to L jaw. Pt states hx of MI, states feels same. Pt states took 2 nitro PTA, no pain relief. Pt states also took  ASA pta.

## 2016-11-12 DIAGNOSIS — F419 Anxiety disorder, unspecified: Secondary | ICD-10-CM | POA: Diagnosis not present

## 2016-11-12 DIAGNOSIS — E119 Type 2 diabetes mellitus without complications: Secondary | ICD-10-CM | POA: Diagnosis not present

## 2016-11-12 DIAGNOSIS — I214 Non-ST elevation (NSTEMI) myocardial infarction: Secondary | ICD-10-CM | POA: Diagnosis not present

## 2016-11-12 DIAGNOSIS — Z87891 Personal history of nicotine dependence: Secondary | ICD-10-CM | POA: Diagnosis not present

## 2016-11-12 DIAGNOSIS — I110 Hypertensive heart disease with heart failure: Secondary | ICD-10-CM | POA: Diagnosis not present

## 2016-11-12 DIAGNOSIS — Z9114 Patient's other noncompliance with medication regimen: Secondary | ICD-10-CM | POA: Diagnosis not present

## 2016-11-12 DIAGNOSIS — R7303 Prediabetes: Secondary | ICD-10-CM | POA: Diagnosis not present

## 2016-11-12 DIAGNOSIS — I2109 ST elevation (STEMI) myocardial infarction involving other coronary artery of anterior wall: Secondary | ICD-10-CM | POA: Diagnosis not present

## 2016-11-12 DIAGNOSIS — I255 Ischemic cardiomyopathy: Secondary | ICD-10-CM | POA: Diagnosis not present

## 2016-11-12 DIAGNOSIS — Z6841 Body Mass Index (BMI) 40.0 and over, adult: Secondary | ICD-10-CM | POA: Diagnosis not present

## 2016-11-12 DIAGNOSIS — I5042 Chronic combined systolic (congestive) and diastolic (congestive) heart failure: Secondary | ICD-10-CM | POA: Diagnosis not present

## 2016-11-12 DIAGNOSIS — E785 Hyperlipidemia, unspecified: Secondary | ICD-10-CM | POA: Diagnosis present

## 2016-11-12 DIAGNOSIS — I222 Subsequent non-ST elevation (NSTEMI) myocardial infarction: Secondary | ICD-10-CM | POA: Diagnosis not present

## 2016-11-12 DIAGNOSIS — K219 Gastro-esophageal reflux disease without esophagitis: Secondary | ICD-10-CM | POA: Diagnosis not present

## 2016-11-12 DIAGNOSIS — I2510 Atherosclerotic heart disease of native coronary artery without angina pectoris: Secondary | ICD-10-CM | POA: Diagnosis not present

## 2016-11-12 DIAGNOSIS — I10 Essential (primary) hypertension: Secondary | ICD-10-CM | POA: Diagnosis not present

## 2016-11-12 DIAGNOSIS — Z9861 Coronary angioplasty status: Secondary | ICD-10-CM | POA: Diagnosis not present

## 2016-11-12 DIAGNOSIS — R079 Chest pain, unspecified: Secondary | ICD-10-CM | POA: Diagnosis not present

## 2016-11-12 DIAGNOSIS — I252 Old myocardial infarction: Secondary | ICD-10-CM | POA: Diagnosis not present

## 2016-11-12 DIAGNOSIS — E7800 Pure hypercholesterolemia, unspecified: Secondary | ICD-10-CM | POA: Diagnosis not present

## 2016-11-12 DIAGNOSIS — F329 Major depressive disorder, single episode, unspecified: Secondary | ICD-10-CM | POA: Diagnosis present

## 2016-11-12 LAB — TROPONIN I
Troponin I: 0.23 ng/mL (ref <0.03)
Troponin I: 0.23 ng/mL (ref <0.03)
Troponin I: 0.3 ng/mL (ref <0.03)

## 2016-11-12 LAB — CBC
HCT: 35 % — ABNORMAL LOW (ref 39.0–52.0)
Hemoglobin: 11.8 g/dL — ABNORMAL LOW (ref 13.0–17.0)
MCH: 28.6 pg (ref 26.0–34.0)
MCHC: 33.7 g/dL (ref 30.0–36.0)
MCV: 85 fL (ref 78.0–100.0)
Platelets: 190 10*3/uL (ref 150–400)
RBC: 4.12 MIL/uL — ABNORMAL LOW (ref 4.22–5.81)
RDW: 14 % (ref 11.5–15.5)
WBC: 8.9 10*3/uL (ref 4.0–10.5)

## 2016-11-12 LAB — BASIC METABOLIC PANEL
Anion gap: 9 (ref 5–15)
BUN: 9 mg/dL (ref 6–20)
CO2: 25 mmol/L (ref 22–32)
Calcium: 8.7 mg/dL — ABNORMAL LOW (ref 8.9–10.3)
Chloride: 106 mmol/L (ref 101–111)
Creatinine, Ser: 1.19 mg/dL (ref 0.61–1.24)
GFR calc Af Amer: >60 mL/min (ref >60)
GFR calc non Af Amer: >60 mL/min (ref >60)
Glucose, Bld: 140 mg/dL — ABNORMAL HIGH (ref 65–99)
Potassium: 3.5 mmol/L (ref 3.5–5.1)
Sodium: 140 mmol/L (ref 135–145)

## 2016-11-12 LAB — HIV ANTIBODY (ROUTINE TESTING W REFLEX): HIV Screen 4th Generation wRfx: NONREACTIVE

## 2016-11-12 LAB — HEPARIN LEVEL (UNFRACTIONATED)
Heparin Unfractionated: 0.26 IU/mL — ABNORMAL LOW (ref 0.30–0.70)
Heparin Unfractionated: <0.1 IU/mL — ABNORMAL LOW (ref 0.30–0.70)

## 2016-11-12 MED ORDER — POTASSIUM CHLORIDE CRYS ER 20 MEQ PO TBCR
20.0000 meq | EXTENDED_RELEASE_TABLET | Freq: Once | ORAL | Status: AC
  Administered 2016-11-12: 20 meq via ORAL
  Filled 2016-11-12: qty 1

## 2016-11-12 MED ORDER — CARVEDILOL 12.5 MG PO TABS
12.5000 mg | ORAL_TABLET | Freq: Two times a day (BID) | ORAL | Status: DC
  Administered 2016-11-12 (×2): 12.5 mg via ORAL
  Filled 2016-11-12 (×3): qty 1

## 2016-11-12 MED ORDER — ISOSORBIDE MONONITRATE ER 30 MG PO TB24
30.0000 mg | ORAL_TABLET | Freq: Every day | ORAL | Status: DC
  Administered 2016-11-12: 30 mg via ORAL
  Filled 2016-11-12 (×2): qty 1

## 2016-11-12 MED ORDER — TICAGRELOR 90 MG PO TABS
90.0000 mg | ORAL_TABLET | Freq: Two times a day (BID) | ORAL | Status: DC
  Administered 2016-11-12 (×2): 90 mg via ORAL
  Filled 2016-11-12 (×3): qty 1

## 2016-11-12 MED ORDER — ONDANSETRON HCL 4 MG/2ML IJ SOLN: 4.0000 mg | Freq: Four times a day (QID) | INTRAMUSCULAR | Status: DC | PRN

## 2016-11-12 MED ORDER — HEPARIN BOLUS VIA INFUSION
4000.0000 [IU] | Freq: Once | INTRAVENOUS | Status: AC
  Administered 2016-11-12: 4000 [IU] via INTRAVENOUS
  Filled 2016-11-12: qty 4000

## 2016-11-12 MED ORDER — FENOFIBRATE 54 MG PO TABS
54.0000 mg | ORAL_TABLET | Freq: Every day | ORAL | Status: DC
  Administered 2016-11-12: 54 mg via ORAL
  Filled 2016-11-12 (×2): qty 1

## 2016-11-12 MED ORDER — HEPARIN (PORCINE) IN NACL 100-0.45 UNIT/ML-% IJ SOLN
1800.0000 [IU]/h | INTRAMUSCULAR | Status: DC
  Administered 2016-11-12: 1500 [IU]/h via INTRAVENOUS
  Administered 2016-11-12: 1650 [IU]/h via INTRAVENOUS
  Filled 2016-11-12 (×2): qty 250

## 2016-11-12 MED ORDER — BUPROPION HCL ER (SR) 150 MG PO TB12
150.0000 mg | ORAL_TABLET | Freq: Two times a day (BID) | ORAL | Status: DC
  Administered 2016-11-12 (×2): 150 mg via ORAL
  Filled 2016-11-12 (×3): qty 1

## 2016-11-12 MED ORDER — ASPIRIN EC 81 MG PO TBEC
81.0000 mg | DELAYED_RELEASE_TABLET | Freq: Every day | ORAL | Status: DC
  Administered 2016-11-12: 81 mg via ORAL
  Filled 2016-11-12 (×2): qty 1

## 2016-11-12 MED ORDER — ACETAMINOPHEN 325 MG PO TABS: 650.0000 mg | ORAL_TABLET | ORAL | Status: DC | PRN

## 2016-11-12 MED ORDER — ATORVASTATIN CALCIUM 80 MG PO TABS
80.0000 mg | ORAL_TABLET | Freq: Every day | ORAL | Status: DC
  Administered 2016-11-12: 80 mg via ORAL
  Filled 2016-11-12: qty 1

## 2016-11-12 MED ORDER — HEPARIN SODIUM (PORCINE) 5000 UNIT/ML IJ SOLN: 5000.0000 [IU] | Freq: Three times a day (TID) | INTRAMUSCULAR | Status: DC

## 2016-11-12 NOTE — Progress Notes (Signed)
Progress Note  Patient Name: Peter Russo Date of Encounter: 11/12/2016  Primary Cardiologist: Nishan/Klein  Subjective   CP resolved, no SOB  Inpatient Medications    Scheduled Meds: . aspirin EC  81 mg Oral Daily  . atorvastatin  80 mg Oral q1800  . buPROPion  150 mg Oral BID WC  . carvedilol  12.5 mg Oral BID WC  . fenofibrate  54 mg Oral Daily  . isosorbide mononitrate  30 mg Oral Daily  . ticagrelor  90 mg Oral BID   Continuous Infusions: . heparin 1,650 Units/hr (11/12/16 1106)   PRN Meds: acetaminophen, ondansetron (ZOFRAN) IV   Vital Signs    Vitals:   11/11/16 2129 11/12/16 0053  BP: (!) 149/80 134/77  Pulse: 86 83  Resp: 18 18  Temp: 98.6 F (37 C) 97.7 F (36.5 C)  TempSrc: Oral Oral  SpO2: 95% 100%  Weight:  274 lb 8 oz (124.5 kg)  Height:   (1.702 m)    Intake/Output Summary (Last 24 hours) at 11/12/16 1146 Last data filed at 11/12/16 0200  Gross per 24 hour  Intake              120 ml  Output                0 ml  Net              120 ml   Filed Weights   11/12/16 0053  Weight: 274 lb 8 oz (124.5 kg)    Telemetry    No adverse rhythms - Personally Reviewed  ECG    NSR right axis, old septal infarct pattern with PRWP - Personally Reviewed  Physical Exam   GEN: No acute distress.   Neck: No JVD Cardiac: RRR, no murmurs, rubs, or gallops.  Respiratory: Clear to auscultation bilaterally. GI: Soft, nontender, non-distended  MS: No edema; No deformity. Neuro:  Nonfocal  Psych: Normal affect   Labs    Chemistry Recent Labs Lab 11/11/16 2127 11/12/16 0404  NA 139 140  K 3.6 3.5  CL 106 106  CO2 24 25  GLUCOSE 154* 140*  BUN 9 9  CREATININE 1.29* 1.19  CALCIUM 9.0 8.7*  GFRNONAA >60 >60  GFRAA >60 >60  ANIONGAP 9 9     Hematology Recent Labs Lab 11/11/16 2127 11/12/16 0404  WBC 11.5* 8.9  RBC 4.44 4.12*  HGB 12.8* 11.8*  HCT 37.8* 35.0*  MCV 85.1 85.0  MCH 28.8 28.6  MCHC 33.9 33.7  RDW 14.1  14.0  PLT 213 190    Cardiac Enzymes Recent Labs Lab 11/12/16 0103 11/12/16 0404 11/12/16 0752  TROPONINI 0.23* 0.23* 0.30*    Recent Labs Lab 11/11/16 2143  TROPIPOC 0.03     BNP Recent Labs Lab 11/11/16 2127  BNP 190.0*     DDimer No results for input(s): DDIMER in the last 168 hours.   Radiology    Dg Chest 2 View  Result Date: 11/11/2016 CLINICAL DATA:  Acute onset of left-sided chest pain, radiating to the left jaw. Shortness of breath. Initial encounter. EXAM: CHEST  2 VIEW COMPARISON:  Chest radiograph performed 03/24/2016 FINDINGS: The lungs are well-aerated. Vascular congestion is noted. Increased interstitial markings raise concern for pulmonary edema. There is no evidence of pleural effusion or pneumothorax. The heart is normal in size; the mediastinal contour is within normal limits. No acute osseous abnormalities are seen. IMPRESSION: Vascular congestion. Increased interstitial markings raise concern for pulmonary  edema. Electronically Signed   By: Roanna Raider M.D.   On: 11/11/2016 21:57    Cardiac Studies   2D Echocardiogram8.11.2017 Study Conclusions - Left ventricle: The cavity size was normal. Wall thickness was increased in a pattern of mild LVH. Systolic function was moderately to severely reduced. The estimated ejection fraction was in the range of 30% to 35%. Diffuse hypokinesis. There is akinesis of the apical myocardium. Features are consistent with a pseudonormal left ventricular filling pattern, with concomitant abnormal relaxation and increased filling pressure (grade 2 diastolic dysfunction). Impressions: - Technically difficult; definity used; diffuse hypokinesis with apical akinesis; overall moderate to severe LV dysfunction; grade 2 diastolic dysfunction; indeterminant LV filling pressure. _____________  Cardiac Catheterization8.11.2017  Coronary Findings  Dominance: Right  Left Anterior Descending  Prox  LAD lesion, 0% stenosed. Prox LAD lesion with no stenosis was previously treated.  Prox LAD to Dist LAD lesion, 0% stenosed. Prox LAD to Dist LAD lesion with no stenosis was previously treated.  Dist LAD lesion, 25% stenosed.  First Diagonal Branch  Vessel is small in size.  Second Diagonal Branch  2nd Diag lesion, 45% stenosed. The lesion is dissected.  Left Circumflex  Prox Cx to Mid Cx lesion, 10% stenosed. The lesion was previously treated.  Third Obtuse Marginal Branch  Vessel is small in size.  Right Coronary Artery  Dist RCA filled by collaterals from Prox RCA.  Prox RCA lesion, 100% stenosed.   Coronary Diagrams     _____________  10/19/16 Coronary Balloon Angioplasty  Intravascular Ultrasound/IVUS  Left Heart Cath and Coronary Angiography  Conclusion     Prox Cx to Mid Cx lesion, 10 %stenosed.  Prox RCA lesion, 100 %stenosed. Chronic total occlusion.  2nd Diag lesion, 45 %stenosed.  Prox LAD lesion, 100 %stenosed. This was the culprit lesion and treated with thrombectomy and angioplasty, > 4 mm. IVUS showed the vessel diameter at approximaltely 4 mm.  Post intervention, there is a 0% residual stenosis.  There is no aortic valve stenosis.  LV end diastolic pressure is severely elevated.  He needs aggressive risk factor modification. He needs to be compliant with dual antiplatelet therapy. We elected not to place any additional stents since there were questions about compliance. There was inadequate result with just balloon dilatation with intravascular ultrasound guidance. He states he was taking his Brilinta. In the event that he had missed some doses, we gave a dose of intravenous tirofiban. He was again loaded with oral Brilinta. He'll need aggressive medical therapy for his systolic dysfunction. Elevated LVEDP. Hold off on IV fluids at this point.     Patient Profile     41 y.o. male with known complex CAD with last cath on 10/19/16 POBA of LAD with no  additional stent placement in the setting of STEMI (worried about compliance) here with mildly elevated troponin 0.23-0.3, NSTEMI (on 10/19/16 was 0.65)  Assessment & Plan    NSTEMI  - there is no evidence of STEMI on ECG  - Prior cath 2-3 weeks ago as above. Med mgt,  - Will continue med mgt for now. Would not bring back to cath lab unless symptoms worsen.  - Has increased BP, so will increase coreg to 25 BID and continue Imdur 30.  - He admits to being compliant with meds.   - Now pain free  - Will keep 24 hours to make sure no VT.   - States drank a Coke, belched, and pain resolved  If stable home tomorrow. He  really wants to go home today.  Hopeful early DC Relayed to Dr. Eden Emms as well.   HL  - statin  Chronic systolic HF  - NYHA 1-2 - comfortable  Tobacco cessation  Donato Schultz, MD    Signed, Donato Schultz, MD  11/12/2016, 11:46 AM

## 2016-11-12 NOTE — Progress Notes (Signed)
ANTICOAGULATION CONSULT NOTE - Initial Consult  Pharmacy Consult for Heparin Indication: chest pain/ACS  No Known Allergies  Patient Measurements: Height:  (170.2 cm) Weight: 274 lb 8 oz (124.5 kg) IBW/kg (Calculated) : 66.1 Heparin Dosing Weight: 95 kg  Vital Signs: Temp: 97.7 F (36.5 C) (03/31 0053) Temp Source: Oral (03/31 0053) BP: 134/77 (03/31 0053) Pulse Rate: 83 (03/31 0053)  Labs:  Recent Labs  11/11/16 2127 11/12/16 0103 11/12/16 0404 11/12/16 0752 11/12/16 0946  HGB 12.8*  --  11.8*  --   --   HCT 37.8*  --  35.0*  --   --   PLT 213  --  190  --   --   HEPARINUNFRC  --   --   --   --  0.26*  CREATININE 1.29*  --  1.19  --   --   TROPONINI  --  0.23* 0.23* 0.30*  --     Estimated Creatinine Clearance: 102.4 mL/min (by C-G formula based on SCr of 1.19 mg/dL).   Medical History: Past Medical History:  Diagnosis Date  . Anxiety   . CAD S/P percutaneous coronary angioplasty    a. s/p BMS-mLAD 2010. b. DES to RCA 2012. c. 04/2015: DES to LCx and PCI to LAD c/b dissection with subsequent overlapping DES to mLAD - r/i for NSTEMI afterwards; d. 08/2015 Ant STEMI in setting of noncompliance->135mLAD ISR (DES); e. 03/2016 PCI of apical LAD; f. 03/2016 Relook Cath: patent LAD stents, D1 25, D2 45, LCX 10p/m, RCA 100p CTO-->Med Rx.  . Chronic combined systolic and diastolic CHF (congestive heart failure) (HCC)    a. 03/2016 Echo: EF 30-35%, Gr2 DD  . Depression   . Essential hypertension   . GAD (generalized anxiety disorder)   . GERD (gastroesophageal reflux disease)   . Hypercholesteremia   . Ischemic cardiomyopathy 09/14/2015   a. prior EF 25-35 percent at cath, 35-40 percent by echo; b. 03/2016 Echo: EF 30-35%, diff HK, Gr2 DD.  . Morbid obesity (HCC)   . Pre-diabetes   . ST elevation (STEMI) myocardial infarction involving left anterior descending coronary artery (HCC) 09/14/2015   3.0 x 16 mm Synergy DES to the LAD for ISR BMS  . STEMI involving left  anterior descending coronary artery (HCC) 10/19/2016   balloon angioplasty to proximal LAD, no PCI  . SVT (supraventricular tachycardia) (HCC) APRROX 10 YRS AGO   PRESUMED AVNRT, ECHO 12/07 WITH EF 65%, MILD LAE  . Tobacco abuse     Medications:  Prescriptions Prior to Admission  Medication Sig Dispense Refill Last Dose  . aspirin 81 MG EC tablet Take 1 tablet (81 mg total) by mouth daily. 90 tablet 3 11/11/2016 at Unknown time  . atorvastatin (LIPITOR) 80 MG tablet Take 1 tablet (80 mg total) by mouth daily at 6 PM. 90 tablet 3 11/11/2016 at Unknown time  . buPROPion (WELLBUTRIN SR) 150 MG 12 hr tablet Take 1 tablet (150 mg total) by mouth 2 (two) times daily. 60 tablet 11 11/11/2016 at Unknown time  . carvedilol (COREG) 12.5 MG tablet Take 1 tablet (12.5 mg total) by mouth 2 (two) times daily. 60 tablet 6 11/11/2016 at 1800  . fenofibrate (TRICOR) 48 MG tablet Take 1 tablet (48 mg total) by mouth daily. 30 tablet 11 11/11/2016 at Unknown time  . isosorbide mononitrate (IMDUR) 30 MG 24 hr tablet Take 1 tablet (30 mg total) by mouth daily. 30 tablet 6 11/11/2016 at Unknown time  . losartan (COZAAR) 25  MG tablet Take 1 tablet (25 mg total) by mouth daily. 30 tablet 11 11/11/2016 at Unknown time  . nitroGLYCERIN (NITROSTAT) 0.4 MG SL tablet Place 1 tablet (0.4 mg total) under the tongue every 5 (five) minutes as needed for chest pain (3 x). 25 tablet 2 unk  . ticagrelor (BRILINTA) 90 MG TABS tablet Take 1 tablet (90 mg total) by mouth 2 (two) times daily. 180 tablet 3 11/11/2016 at Unknown time    Assessment: 41 y.o. male with chest pain, elevated cardiac markers, for heparin. No anticoagulation PTA; Hgb down slightly 11.8; PLT stable WNL; no bleeding documented.  Heparin level subtherapeutic: 0.26  Goal of Therapy:  Heparin level 0.3-0.7 units/ml Monitor platelets by anticoagulation protocol: Yes   Plan:  Increase heparin gtt to 1650 units/hr (~17 units/kg) Check heparin level in 6  hours Daily heparin level and CBC  Monitor for s/sx of bleeding  Ruben Im, PharmD Clinical Pharmacist Pager: [phone] 11/12/2016 11:00 AM

## 2016-11-12 NOTE — Significant Event (Signed)
Paged by nurse to inquire about NPO status, they had received word from night shift about possible stress test. I had spoken with fellow this AM who signed out that he suspected patient would need repeat cath this admission and consideration of stenting instead of prior POBA given new uptrend in troponin. Nurse reports patient has been pain free this AM, will resume diet. Pending MD round this AM. Ronie Spies PA-C

## 2016-11-12 NOTE — Progress Notes (Signed)
   11/12/16 0053  Vitals  Temp 97.7 F (36.5 C)  Temp Source Oral  BP 134/77  BP Location Left Arm  BP Method Automatic  Patient Position (if appropriate) Sitting  Pulse Rate 83  Pulse Rate Source Dinamap  Resp 18  Oxygen Therapy  SpO2 100 %  O2 Device Room Air  Pain Assessment  Pain Assessment No/denies pain  Pain Score 0  Height and Weight  Height  (1.702 m)  Weight 124.5 kg (274 lb 8 oz)  Type of Scale Used Standing  Type of Weight Actual  BSA (Calculated - sq m) 2.43 sq meters  BMI (Calculated) 43.1  Weight in (lb) to have BMI = 25 159.3   Pt arrived to unit. No complaints of pain. VSS. Pt oriented to room. Pt advised of belongings policy. Money sent to security office. Copy of belongings form in chart. Will continue to monitor.

## 2016-11-12 NOTE — Significant Event (Signed)
Cardiology Cross Cover Note  Subsequent troponin elevated to 0.2.  Starting heparin drip, ACS nomogram.  NPO.   Tammy Sours Concetta Guion, MD 11/12/2016, 3:06 AM

## 2016-11-12 NOTE — Progress Notes (Addendum)
Heparin found to be off. Restarted to previous order. Pt. Reported that it wasn't running for at least 2 hours. Pharmacist made aware. Will monitor.

## 2016-11-12 NOTE — Progress Notes (Signed)
ANTICOAGULATION CONSULT NOTE - Initial Consult  Pharmacy Consult for Heparin Indication: chest pain/ACS  No Known Allergies  Patient Measurements: Height:  (170.2 cm) Weight: 274 lb 8 oz (124.5 kg) IBW/kg (Calculated) : 66.1 Heparin Dosing Weight: 95 kg  Vital Signs: Temp: 97.7 F (36.5 C) (03/31 0053) Temp Source: Oral (03/31 0053) BP: 134/77 (03/31 0053) Pulse Rate: 83 (03/31 0053)  Labs:  Recent Labs  11/11/16 2127 11/12/16 0103  HGB 12.8*  --   HCT 37.8*  --   PLT 213  --   CREATININE 1.29*  --   TROPONINI  --  0.23*    Estimated Creatinine Clearance: 94.4 mL/min (A) (by C-G formula based on SCr of 1.29 mg/dL (H)).   Medical History: Past Medical History:  Diagnosis Date  . Anxiety   . CAD S/P percutaneous coronary angioplasty    a. s/p BMS-mLAD 2010. b. DES to RCA 2012. c. 04/2015: DES to LCx and PCI to LAD c/b dissection with subsequent overlapping DES to mLAD - r/i for NSTEMI afterwards; d. 08/2015 Ant STEMI in setting of noncompliance->186mLAD ISR (DES); e. 03/2016 PCI of apical LAD; f. 03/2016 Relook Cath: patent LAD stents, D1 25, D2 45, LCX 10p/m, RCA 100p CTO-->Med Rx.  . Chronic combined systolic and diastolic CHF (congestive heart failure) (HCC)    a. 03/2016 Echo: EF 30-35%, Gr2 DD  . Depression   . Essential hypertension   . GAD (generalized anxiety disorder)   . GERD (gastroesophageal reflux disease)   . Hypercholesteremia   . Ischemic cardiomyopathy 09/14/2015   a. prior EF 25-35 percent at cath, 35-40 percent by echo; b. 03/2016 Echo: EF 30-35%, diff HK, Gr2 DD.  . Morbid obesity (HCC)   . Pre-diabetes   . ST elevation (STEMI) myocardial infarction involving left anterior descending coronary artery (HCC) 09/14/2015   3.0 x 16 mm Synergy DES to the LAD for ISR BMS  . STEMI involving left anterior descending coronary artery (HCC) 10/19/2016   balloon angioplasty to proximal LAD, no PCI  . SVT (supraventricular tachycardia) (HCC) APRROX 10 YRS  AGO   PRESUMED AVNRT, ECHO 12/07 WITH EF 65%, MILD LAE  . Tobacco abuse     Medications:  Prescriptions Prior to Admission  Medication Sig Dispense Refill Last Dose  . aspirin 81 MG EC tablet Take 1 tablet (81 mg total) by mouth daily. 90 tablet 3 11/11/2016 at Unknown time  . atorvastatin (LIPITOR) 80 MG tablet Take 1 tablet (80 mg total) by mouth daily at 6 PM. 90 tablet 3 11/11/2016 at Unknown time  . buPROPion (WELLBUTRIN SR) 150 MG 12 hr tablet Take 1 tablet (150 mg total) by mouth 2 (two) times daily. 60 tablet 11 11/11/2016 at Unknown time  . carvedilol (COREG) 12.5 MG tablet Take 1 tablet (12.5 mg total) by mouth 2 (two) times daily. 60 tablet 6 11/11/2016 at 1800  . fenofibrate (TRICOR) 48 MG tablet Take 1 tablet (48 mg total) by mouth daily. 30 tablet 11 11/11/2016 at Unknown time  . isosorbide mononitrate (IMDUR) 30 MG 24 hr tablet Take 1 tablet (30 mg total) by mouth daily. 30 tablet 6 11/11/2016 at Unknown time  . losartan (COZAAR) 25 MG tablet Take 1 tablet (25 mg total) by mouth daily. 30 tablet 11 11/11/2016 at Unknown time  . nitroGLYCERIN (NITROSTAT) 0.4 MG SL tablet Place 1 tablet (0.4 mg total) under the tongue every 5 (five) minutes as needed for chest pain (3 x). 25 tablet 2 unk  .  ticagrelor (BRILINTA) 90 MG TABS tablet Take 1 tablet (90 mg total) by mouth 2 (two) times daily. 180 tablet 3 11/11/2016 at Unknown time    Assessment: 41 y.o. male with chest pain, elevated cardiac markers, for heparin  Goal of Therapy:  Heparin level 0.3-0.7 units/ml Monitor platelets by anticoagulation protocol: Yes   Plan:  Heparin 4000 units IV bolus, then start heparin 1500 units/hr Check heparin level in 6 hours.   Eddie Candle 11/12/2016,3:11 AM

## 2016-11-12 NOTE — Progress Notes (Signed)
ANTICOAGULATION CONSULT NOTE - Initial Consult  Pharmacy Consult for Heparin Indication: chest pain/ACS  No Known Allergies  Patient Measurements: Height:  (170.2 cm) Weight: 274 lb 8 oz (124.5 kg) IBW/kg (Calculated) : 66.1 Heparin Dosing Weight: 95 kg  Vital Signs: BP: 124/78 (03/31 1404) Pulse Rate: 74 (03/31 1404)  Labs:  Recent Labs  11/11/16 2127 11/12/16 0103 11/12/16 0404 11/12/16 0752 11/12/16 0946 11/12/16 1823  HGB 12.8*  --  11.8*  --   --   --   HCT 37.8*  --  35.0*  --   --   --   PLT 213  --  190  --   --   --   HEPARINUNFRC  --   --   --   --  0.26* <0.10*  CREATININE 1.29*  --  1.19  --   --   --   TROPONINI  --  0.23* 0.23* 0.30*  --   --    Estimated Creatinine Clearance: 102.4 mL/min (by C-G formula based on SCr of 1.19 mg/dL).  Medical History: Past Medical History:  Diagnosis Date  . Anxiety   . CAD S/P percutaneous coronary angioplasty    a. s/p BMS-mLAD 2010. b. DES to RCA 2012. c. 04/2015: DES to LCx and PCI to LAD c/b dissection with subsequent overlapping DES to mLAD - r/i for NSTEMI afterwards; d. 08/2015 Ant STEMI in setting of noncompliance->159mLAD ISR (DES); e. 03/2016 PCI of apical LAD; f. 03/2016 Relook Cath: patent LAD stents, D1 25, D2 45, LCX 10p/m, RCA 100p CTO-->Med Rx.  . Chronic combined systolic and diastolic CHF (congestive heart failure) (HCC)    a. 03/2016 Echo: EF 30-35%, Gr2 DD  . Depression   . Essential hypertension   . GAD (generalized anxiety disorder)   . GERD (gastroesophageal reflux disease)   . Hypercholesteremia   . Ischemic cardiomyopathy 09/14/2015   a. prior EF 25-35 percent at cath, 35-40 percent by echo; b. 03/2016 Echo: EF 30-35%, diff HK, Gr2 DD.  . Morbid obesity (HCC)   . Pre-diabetes   . ST elevation (STEMI) myocardial infarction involving left anterior descending coronary artery (HCC) 09/14/2015   3.0 x 16 mm Synergy DES to the LAD for ISR BMS  . STEMI involving left anterior descending coronary  artery (HCC) 10/19/2016   balloon angioplasty to proximal LAD, no PCI  . SVT (supraventricular tachycardia) (HCC) APRROX 10 YRS AGO   PRESUMED AVNRT, ECHO 12/07 WITH EF 65%, MILD LAE  . Tobacco abuse    Medications:  Prescriptions Prior to Admission  Medication Sig Dispense Refill Last Dose  . aspirin 81 MG EC tablet Take 1 tablet (81 mg total) by mouth daily. 90 tablet 3 11/11/2016 at Unknown time  . atorvastatin (LIPITOR) 80 MG tablet Take 1 tablet (80 mg total) by mouth daily at 6 PM. 90 tablet 3 11/11/2016 at Unknown time  . buPROPion (WELLBUTRIN SR) 150 MG 12 hr tablet Take 1 tablet (150 mg total) by mouth 2 (two) times daily. 60 tablet 11 11/11/2016 at Unknown time  . carvedilol (COREG) 12.5 MG tablet Take 1 tablet (12.5 mg total) by mouth 2 (two) times daily. 60 tablet 6 11/11/2016 at 1800  . fenofibrate (TRICOR) 48 MG tablet Take 1 tablet (48 mg total) by mouth daily. 30 tablet 11 11/11/2016 at Unknown time  . isosorbide mononitrate (IMDUR) 30 MG 24 hr tablet Take 1 tablet (30 mg total) by mouth daily. 30 tablet 6 11/11/2016 at Unknown time  .  losartan (COZAAR) 25 MG tablet Take 1 tablet (25 mg total) by mouth daily. 30 tablet 11 11/11/2016 at Unknown time  . nitroGLYCERIN (NITROSTAT) 0.4 MG SL tablet Place 1 tablet (0.4 mg total) under the tongue every 5 (five) minutes as needed for chest pain (3 x). 25 tablet 2 unk  . ticagrelor (BRILINTA) 90 MG TABS tablet Take 1 tablet (90 mg total) by mouth 2 (two) times daily. 180 tablet 3 11/11/2016 at Unknown time   Assessment: 41 y.o. male with chest pain, elevated cardiac markers, for heparin. No anticoagulation PTA; Hgb down slightly 11.8; PLT stable WNL; no bleeding documented. RN reports pump was found turned. Will restart at earlier rate for now.   Heparin level subtherapeutic: <0.10   Goal of Therapy:  Heparin level 0.3-0.7 units/ml Monitor platelets by anticoagulation protocol: Yes   Plan:  Restart heparin gtt at 1650 units/hr (~17  units/kg) Check heparin level with morning labs Daily heparin level and CBC  Monitor for s/sx of bleeding  Ruben Im, PharmD Clinical Pharmacist Pager: [phone] 11/12/2016 7:49 PM

## 2016-11-13 DIAGNOSIS — I10 Essential (primary) hypertension: Secondary | ICD-10-CM

## 2016-11-13 DIAGNOSIS — Z9861 Coronary angioplasty status: Secondary | ICD-10-CM

## 2016-11-13 DIAGNOSIS — E7800 Pure hypercholesterolemia, unspecified: Secondary | ICD-10-CM

## 2016-11-13 DIAGNOSIS — E119 Type 2 diabetes mellitus without complications: Secondary | ICD-10-CM

## 2016-11-13 LAB — CBC
HCT: 37.5 % — ABNORMAL LOW (ref 39.0–52.0)
Hemoglobin: 12.6 g/dL — ABNORMAL LOW (ref 13.0–17.0)
MCH: 28.8 pg (ref 26.0–34.0)
MCHC: 33.6 g/dL (ref 30.0–36.0)
MCV: 85.8 fL (ref 78.0–100.0)
Platelets: 206 10*3/uL (ref 150–400)
RBC: 4.37 MIL/uL (ref 4.22–5.81)
RDW: 14.2 % (ref 11.5–15.5)
WBC: 10.6 10*3/uL — ABNORMAL HIGH (ref 4.0–10.5)

## 2016-11-13 LAB — HEPARIN LEVEL (UNFRACTIONATED): Heparin Unfractionated: 0.21 IU/mL — ABNORMAL LOW (ref 0.30–0.70)

## 2016-11-13 MED ORDER — ACETAMINOPHEN 325 MG PO TABS: 650.0000 mg | ORAL_TABLET | ORAL | Status: AC | PRN

## 2016-11-13 NOTE — Progress Notes (Signed)
Progress Note  Patient Name: Peter Russo Date of Encounter: 11/13/2016  Primary Cardiologist: Nishan/Klein  Subjective   Feels well, wants to go. No CP, no SOB.   Inpatient Medications    Scheduled Meds: . aspirin EC  81 mg Oral Daily  . atorvastatin  80 mg Oral q1800  . buPROPion  150 mg Oral BID WC  . carvedilol  12.5 mg Oral BID WC  . fenofibrate  54 mg Oral Daily  . isosorbide mononitrate  30 mg Oral Daily  . ticagrelor  90 mg Oral BID   Continuous Infusions: . heparin 1,800 Units/hr (11/13/16 0346)   PRN Meds: acetaminophen, ondansetron (ZOFRAN) IV   Vital Signs    Vitals:   11/12/16 0053 11/12/16 1404 11/12/16 2027 11/13/16 0500  BP: 134/77 124/78 123/83 115/83  Pulse: 83 74 92 80  Resp: (!) 22  Temp: 97.7 F (36.5 C)  97.9 F (36.6 C) 97.6 F (36.4 C)  TempSrc: Oral  Oral Oral  SpO2: 100% 100% 98% 95%  Weight: 274 lb 8 oz (124.5 kg)     Height:  (1.702 m)       Intake/Output Summary (Last 24 hours) at 11/13/16 0809 Last data filed at 11/13/16 0346  Gross per 24 hour  Intake           373.37 ml  Output                1 ml  Net           372.37 ml   Filed Weights   11/12/16 0053  Weight: 274 lb 8 oz (124.5 kg)    Telemetry    No adverse rhythms - Personally Reviewed  ECG    NSR right axis, old septal infarct pattern with PRWP - Personally Reviewed  Physical Exam   GEN: No acute distress.   Neck: No JVD Cardiac: RRR, no murmurs, rubs, or gallops.  Respiratory: Clear to auscultation bilaterally. GI: Soft, nontender, non-distended  MS: No edema; No deformity. Neuro:  Nonfocal  Psych: Normal affect  Unchanged. RRR, CTAB, obese, no edema  Labs    Chemistry  Recent Labs Lab 11/11/16 2127 11/12/16 0404  NA 139 140  K 3.6 3.5  CL 106 106  CO2 24 25  GLUCOSE 154* 140*  BUN 9 9  CREATININE 1.29* 1.19  CALCIUM 9.0 8.7*  GFRNONAA >60 >60  GFRAA >60 >60  ANIONGAP 9 9     Hematology  Recent Labs Lab  11/11/16 2127 11/12/16 0404 11/13/16 0211  WBC 11.5* 8.9 10.6*  RBC 4.44 4.12* 4.37  HGB 12.8* 11.8* 12.6*  HCT 37.8* 35.0* 37.5*  MCV 85.1 85.0 85.8  MCH 28.8 28.6 28.8  MCHC 33.9 33.7 33.6  RDW 14.1 14.0 14.2  PLT 213 190 206    Cardiac Enzymes  Recent Labs Lab 11/12/16 0103 11/12/16 0404 11/12/16 0752  TROPONINI 0.23* 0.23* 0.30*     Recent Labs Lab 11/11/16 2143  TROPIPOC 0.03     BNP  Recent Labs Lab 11/11/16 2127  BNP 190.0*     DDimer No results for input(s): DDIMER in the last 168 hours.   Radiology    Dg Chest 2 View  Result Date: 11/11/2016 CLINICAL DATA:  Acute onset of left-sided chest pain, radiating to the left jaw. Shortness of breath. Initial encounter. EXAM: CHEST  2 VIEW COMPARISON:  Chest radiograph performed 03/24/2016 FINDINGS: The lungs are well-aerated. Vascular congestion is noted. Increased  interstitial markings raise concern for pulmonary edema. There is no evidence of pleural effusion or pneumothorax. The heart is normal in size; the mediastinal contour is within normal limits. No acute osseous abnormalities are seen. IMPRESSION: Vascular congestion. Increased interstitial markings raise concern for pulmonary edema. Electronically Signed   By: Roanna Raider M.D.   On: 11/11/2016 21:57    Cardiac Studies   2D Echocardiogram8.11.2017 Study Conclusions - Left ventricle: The cavity size was normal. Wall thickness was increased in a pattern of mild LVH. Systolic function was moderately to severely reduced. The estimated ejection fraction was in the range of 30% to 35%. Diffuse hypokinesis. There is akinesis of the apical myocardium. Features are consistent with a pseudonormal left ventricular filling pattern, with concomitant abnormal relaxation and increased filling pressure (grade 2 diastolic dysfunction). Impressions: - Technically difficult; definity used; diffuse hypokinesis with apical akinesis; overall  moderate to severe LV dysfunction; grade 2 diastolic dysfunction; indeterminant LV filling pressure. _____________  Cardiac Catheterization8.11.2017  Coronary Findings  Dominance: Right  Left Anterior Descending  Prox LAD lesion, 0% stenosed. Prox LAD lesion with no stenosis was previously treated.  Prox LAD to Dist LAD lesion, 0% stenosed. Prox LAD to Dist LAD lesion with no stenosis was previously treated.  Dist LAD lesion, 25% stenosed.  First Diagonal Branch  Vessel is small in size.  Second Diagonal Branch  2nd Diag lesion, 45% stenosed. The lesion is dissected.  Left Circumflex  Prox Cx to Mid Cx lesion, 10% stenosed. The lesion was previously treated.  Third Obtuse Marginal Branch  Vessel is small in size.  Right Coronary Artery  Dist RCA filled by collaterals from Prox RCA.  Prox RCA lesion, 100% stenosed.   Coronary Diagrams     _____________  10/19/16 Coronary Balloon Angioplasty  Intravascular Ultrasound/IVUS  Left Heart Cath and Coronary Angiography  Conclusion     Prox Cx to Mid Cx lesion, 10 %stenosed.  Prox RCA lesion, 100 %stenosed. Chronic total occlusion.  2nd Diag lesion, 45 %stenosed.  Prox LAD lesion, 100 %stenosed. This was the culprit lesion and treated with thrombectomy and angioplasty, > 4 mm. IVUS showed the vessel diameter at approximaltely 4 mm.  Post intervention, there is a 0% residual stenosis.  There is no aortic valve stenosis.  LV end diastolic pressure is severely elevated.  He needs aggressive risk factor modification. He needs to be compliant with dual antiplatelet therapy. We elected not to place any additional stents since there were questions about compliance. There was inadequate result with just balloon dilatation with intravascular ultrasound guidance. He states he was taking his Brilinta. In the event that he had missed some doses, we gave a dose of intravenous tirofiban. He was again loaded with oral Brilinta.  He'll need aggressive medical therapy for his systolic dysfunction. Elevated LVEDP. Hold off on IV fluids at this point.     Patient Profile     41 y.o. male with known complex CAD with last cath on 10/19/16 POBA of LAD with no additional stent placement in the setting of STEMI (worried about compliance) here with mildly elevated troponin 0.23-0.3, NSTEMI (on 10/19/16 was 0.65)  Assessment & Plan    NSTEMI  - No STEMI on ECG  - No CP, actually quite atypical, Coke..belch, relieved pain he states  - Trop was elevated mild 0.23-0.3 (could have been residual from prior STEMI??)  - Last cath as above, POBA with MED mgt  - No VT on monitor.   - Keep Coreg  12.5 BID. BP improved this admit.   HL  - statin  Chronic systolic HF  - NYHA 1-2 - comfortable  - No symptoms.   Tobacco cessation  - strongly encouraged.   - Significantly increases risk.   OK with DC with close follow up.   Donato Schultz, MD    Signed, Donato Schultz, MD  11/13/2016, 8:09 AM

## 2016-11-13 NOTE — Progress Notes (Signed)
ANTICOAGULATION CONSULT NOTE - Follow Up Consult  Pharmacy Consult for Heparin  Indication: chest pain/ACS  No Known Allergies  Patient Measurements: Height:  (170.2 cm) Weight: 274 lb 8 oz (124.5 kg) IBW/kg (Calculated) : 66.1  Vital Signs: Temp: 97.9 F (36.6 C) (03/31 2027) Temp Source: Oral (03/31 2027) BP: 123/83 (03/31 2027) Pulse Rate: 92 (03/31 2027)  Labs:  Recent Labs  11/11/16 2127 11/12/16 0103 11/12/16 0404 11/12/16 0752 11/12/16 0946 11/12/16 1823 11/13/16 0211  HGB 12.8*  --  11.8*  --   --   --  12.6*  HCT 37.8*  --  35.0*  --   --   --  37.5*  PLT 213  --  190  --   --   --  206  HEPARINUNFRC  --   --   --   --  0.26* <0.10* 0.21*  CREATININE 1.29*  --  1.19  --   --   --   --   TROPONINI  --  0.23* 0.23* 0.30*  --   --   --     Estimated Creatinine Clearance: 102.4 mL/min (by C-G formula based on SCr of 1.19 mg/dL).  Assessment: Heparin for CP, heparin level sub-therapeutic, no issues per RN.  Goal of Therapy:  Heparin level 0.3-0.7 units/ml Monitor platelets by anticoagulation protocol: Yes   Plan:  -Inc heparin to 1800 units/hr -1200 HL  Peter Russo 11/13/2016,3:38 AM

## 2016-11-13 NOTE — Progress Notes (Signed)
Discharged to home with family office visits in place teaching done  

## 2016-11-13 NOTE — Discharge Summary (Signed)
Discharge Summary    Patient ID: Peter Russo,  MRN: [id number], DOB/AGE: August 29, 1975 42 y.o.  Admit date: 11/11/2016 Discharge date: 11/13/2016  Primary Care Provider: Jaclyn Shaggy Primary Cardiologist: Dr Eden Emms  Discharge Diagnoses    Principal Problem:   Ischemic chest pain [facility])  Active Problems:   NSTEMI- Trop 0.3   History of acute anterior wall MI-10/19/16   CAD S/P LAD POBA 10/19/16   Morbid obesity-BMI 43   Pre-diabetes   Tobacco abuse   Essential hypertension   Ischemic cardiomyopathy-EF 40% Jan 2018   H/O medication noncompliance   Chronic combined systolic and diastolic CHF    History of acute anterior wall MI-10/19/16   AKI (acute kidney injury)- SCr 1.29   CAD -residual CTO RCA   Dyslipidemia   Depression   Allergies No Known Allergies  Diagnostic Studies/Procedures    None _____________   History of Present Illness     41 y/o male with history of recent AWMI and LAD PCI 10/19/16- admitted 11/11/16 with chest pain.  Hospital Course      41 y.o.malewith a history of CAD s/p multiple previous PCIs to LAD and LCx and known CTO to RCA, ICM, HTN, HLD, obesity, GERD, non compliance, and pre diabetes who presented to the ED 11/11/16 with chest pain. He was recently admitted 3/7-10/21/16 with an anterior STEMI. He was taken emergently to the cath lab. Culprit lesion was proximal LAD, which was 100% stenosed. Balloon angioplasty alone was performed on the proximal LAD; no stents were placed given questionable medication compliance. He was also noted to have CTO of the RCA, 10% prox-mid Cx and 45% 2nd diagonal. Decision was made to treat his residual CAD medically. He was continued on ASA/Brilitna, lipitor, coreg, losartan and imdur. Per discharge summary Entresto and spironolactone were discontinued.  He was seen in follow up 11/01/16 by Cline Crock who noted the patient was partially compliant- taking his ASA and Brilinta but not Coreg, Lipitor and Imdur.  These were all prescribed again and he was to f/u in two weeks.   He came back to the ED 11/11/16 with chest pain. He was sitting on his couch at 8 PM and noted acute onset chest pressure.  Pain did not abate with 1 SL NTG.  The second SL NTG improved his symptoms, but it did not resolve it and he came to the ED.  By the time he arrived, his symptoms had resolved without other medications.  On interview, pain free.  No acute change to ECG.  Initial troponin was negative.    He was observed overnight and had no further chest pain,. His Troponin did peak at 0.3. He has a follow up in the office 4/5 and will keep that. His SCr was slightly elevated on adm at 1.29 but normal at discharge-1.19. His Cozaar was held on admission but I resumed at discharge. He'll need a f/u BMP in a few weeks.   Discharge Vitals Blood pressure 115/83, pulse 80, temperature 97.6 F (36.4 C), temperature source Oral, resp. rate (!) 22, height  (1.702 m), weight 274 lb 8 oz (124.5 kg), SpO2 95 %.  Filed Weights   11/12/16 0053  Weight: 274 lb 8 oz (124.5 kg)    Labs & Radiologic Studies    CBC  Recent Labs  11/12/16 0404 11/13/16 0211  WBC 8.9 10.6*  HGB 11.8* 12.6*  HCT 35.0* 37.5*  MCV 85.0 85.8  PLT 190 206   Basic Metabolic  Panel  Recent Labs  11/11/16 2127 11/12/16 0404  NA 139 140  K 3.6 3.5  CL 106 106  CO2 24 25  GLUCOSE 154* 140*  BUN 9 9  CREATININE 1.29* 1.19  CALCIUM 9.0 8.7*   Liver Function Tests No results for input(s): AST, ALT, ALKPHOS, BILITOT, PROT, ALBUMIN in the last 72 hours. No results for input(s): LIPASE, AMYLASE in the last 72 hours. Cardiac Enzymes  Recent Labs  11/12/16 0103 11/12/16 0404 11/12/16 0752  TROPONINI 0.23* 0.23* 0.30*   _____________  Dg Chest 2 View  Result Date: 11/11/2016 CLINICAL DATA:  Acute onset of left-sided chest pain, radiating to the left jaw. Shortness of breath. Initial encounter. EXAM: CHEST  2 VIEW COMPARISON:  Chest  radiograph performed 03/24/2016 FINDINGS: The lungs are well-aerated. Vascular congestion is noted. Increased interstitial markings raise concern for pulmonary edema. There is no evidence of pleural effusion or pneumothorax. The heart is normal in size; the mediastinal contour is within normal limits. No acute osseous abnormalities are seen. IMPRESSION: Vascular congestion. Increased interstitial markings raise concern for pulmonary edema. Electronically Signed   By: Roanna Raider M.D.   On: 11/11/2016 21:57   Disposition   Pt is being discharged home today in good condition.  Follow-up Plans & Appointments    Follow-up Information    Cline Crock, PA-C Follow up on 11/17/2016.   Specialties:  Cardiology, Radiology Why:  3:30 pm Contact information: [address] [place] [address] [phone]            Discharge Medications   Current Discharge Medication List    START taking these medications   Details  acetaminophen (TYLENOL) 325 MG tablet Take 2 tablets (650 mg total) by mouth every 4 (four) hours as needed for headache or mild pain.      CONTINUE these medications which have NOT CHANGED   Details  aspirin 81 MG EC tablet Take 1 tablet (81 mg total) by mouth daily. Qty: 90 tablet, Refills: 3    atorvastatin (LIPITOR) 80 MG tablet Take 1 tablet (80 mg total) by mouth daily at 6 PM. Qty: 90 tablet, Refills: 3    buPROPion (WELLBUTRIN SR) 150 MG 12 hr tablet Take 1 tablet (150 mg total) by mouth 2 (two) times daily. Qty: 60 tablet, Refills: 11    carvedilol (COREG) 12.5 MG tablet Take 1 tablet (12.5 mg total) by mouth 2 (two) times daily. Qty: 60 tablet, Refills: 6    fenofibrate (TRICOR) 48 MG tablet Take 1 tablet (48 mg total) by mouth daily. Qty: 30 tablet, Refills: 11   Associated Diagnoses: Elevated triglycerides with high cholesterol; Medication management    isosorbide mononitrate (IMDUR) 30 MG 24 hr tablet Take 1 tablet (30 mg total)  by mouth daily. Qty: 30 tablet, Refills: 6    losartan (COZAAR) 25 MG tablet Take 1 tablet (25 mg total) by mouth daily. Qty: 30 tablet, Refills: 11    nitroGLYCERIN (NITROSTAT) 0.4 MG SL tablet Place 1 tablet (0.4 mg total) under the tongue every 5 (five) minutes as needed for chest pain (3 x). Qty: 25 tablet, Refills: 2    ticagrelor (BRILINTA) 90 MG TABS tablet Take 1 tablet (90 mg total) by mouth 2 (two) times daily. Qty: 180 tablet, Refills: 3           Outstanding Labs/Studies     Duration of Discharge Encounter   Greater than 30 minutes including physician time.  Signed, Corine Shelter PA  11/13/2016, 9:43 AM  Personally seen and examined. Agree with above. Primary Cardiologist: Nishan/Klein  Subjective   Feels well, wants to go. No CP, no SOB.   Inpatient Medications    Scheduled Meds: . aspirin EC  81 mg Oral Daily  . atorvastatin  80 mg Oral q1800  . buPROPion  150 mg Oral BID WC  . carvedilol  12.5 mg Oral BID WC  . fenofibrate  54 mg Oral Daily  . isosorbide mononitrate  30 mg Oral Daily  . ticagrelor  90 mg Oral BID   Continuous Infusions: . heparin 1,800 Units/hr (11/13/16 0346)   PRN Meds: acetaminophen, ondansetron (ZOFRAN) IV   Vital Signs          Vitals:   11/12/16 0053 11/12/16 1404 11/12/16 2027 11/13/16 0500  BP: 134/77 124/78 123/83 115/83  Pulse: 83 74 92 80  Resp: 18 18 20  (!) 22  Temp: 97.7 F (36.5 C)  97.9 F (36.6 C) 97.6 F (36.4 C)  TempSrc: Oral  Oral Oral  SpO2: 100% 100% 98% 95%  Weight: 274 lb 8 oz (124.5 kg)     Height: 5\' 7"  (1.702 m)       Intake/Output Summary (Last 24 hours) at 11/13/16 0809 Last data filed at 11/13/16 0346  Gross per 24 hour  Intake           373.37 ml  Output                1 ml  Net           372.37 ml      Filed Weights   11/12/16 0053  Weight: 274 lb 8 oz (124.5 kg)    Telemetry    No adverse rhythms - Personally Reviewed  ECG    NSR right axis, old  septal infarct pattern with PRWP - Personally Reviewed  Physical Exam   GEN:No acute distress.   Neck:No JVD Cardiac:RRR, no murmurs, rubs, or gallops.  Respiratory:Clear to auscultation bilaterally. WU:JWJX, nontender, non-distended  MS:No edema; No deformity. Neuro:Nonfocal  Psych: Normal affect  Unchanged. RRR, CTAB, obese, no edema  Labs    Chemistry  Last Labs    Recent Labs Lab 11/11/16 2127 11/12/16 0404  NA 139 140  K 3.6 3.5  CL 106 106  CO2 24 25  GLUCOSE 154* 140*  BUN 9 9  CREATININE 1.29* 1.19  CALCIUM 9.0 8.7*  GFRNONAA >60 >60  GFRAA >60 >60  ANIONGAP 9 9       Hematology  Last Labs    Recent Labs Lab 11/11/16 2127 11/12/16 0404 11/13/16 0211  WBC 11.5* 8.9 10.6*  RBC 4.44 4.12* 4.37  HGB 12.8* 11.8* 12.6*  HCT 37.8* 35.0* 37.5*  MCV 85.1 85.0 85.8  MCH 28.8 28.6 28.8  MCHC 33.9 33.7 33.6  RDW 14.1 14.0 14.2  PLT 213 190 206      Cardiac Enzymes  Last Labs    Recent Labs Lab 11/12/16 0103 11/12/16 0404 11/12/16 0752  TROPONINI 0.23* 0.23* 0.30*       Last Labs    Recent Labs Lab 11/11/16 2143  TROPIPOC 0.03       BNP  Last Labs    Recent Labs Lab 11/11/16 2127  BNP 190.0*       DDimer  Last Labs   No results for input(s): DDIMER in the last 168 hours.     Radiology     Imaging Results (Last 48 hours)  Dg  Chest 2 View  Result Date: 11/11/2016 CLINICAL DATA:  Acute onset of left-sided chest pain, radiating to the left jaw. Shortness of breath. Initial encounter. EXAM: CHEST  2 VIEW COMPARISON:  Chest radiograph performed 03/24/2016 FINDINGS: The lungs are well-aerated. Vascular congestion is noted. Increased interstitial markings raise concern for pulmonary edema. There is no evidence of pleural effusion or pneumothorax. The heart is normal in size; the mediastinal contour is within normal limits. No acute osseous abnormalities are seen. IMPRESSION: Vascular congestion. Increased  interstitial markings raise concern for pulmonary edema. Electronically Signed   By: Roanna Raider M.D.   On: 11/11/2016 21:57     Cardiac Studies   2D Echocardiogram8.11.2017 Study Conclusions - Left ventricle: The cavity size was normal. Wall thickness was increased in a pattern of mild LVH. Systolic function was moderately to severely reduced. The estimated ejection fraction was in the range of 30% to 35%. Diffuse hypokinesis. There is akinesis of the apical myocardium. Features are consistent with a pseudonormal left ventricular filling pattern, with concomitant abnormal relaxation and increased filling pressure (grade 2 diastolic dysfunction). Impressions: - Technically difficult; definity used; diffuse hypokinesis with apical akinesis; overall moderate to severe LV dysfunction; grade 2 diastolic dysfunction; indeterminant LV filling pressure. _____________  Cardiac Catheterization8.11.2017  Coronary Findings  Dominance: Right  Left Anterior Descending  Prox LAD lesion, 0% stenosed. Prox LAD lesion with no stenosis was previously treated.  Prox LAD to Dist LAD lesion, 0% stenosed. Prox LAD to Dist LAD lesion with no stenosis was previously treated.  Dist LAD lesion, 25% stenosed.  First Diagonal Branch  Vessel is small in size.  Second Diagonal Branch  2nd Diag lesion, 45% stenosed. The lesion is dissected.  Left Circumflex  Prox Cx to Mid Cx lesion, 10% stenosed. The lesion was previously treated.  Third Obtuse Marginal Branch  Vessel is small in size.  Right Coronary Artery  Dist RCA filled by collaterals from Prox RCA.  Prox RCA lesion, 100% stenosed.   Coronary Diagrams     _____________  10/19/16 Coronary Balloon Angioplasty  Intravascular Ultrasound/IVUS  Left Heart Cath and Coronary Angiography  Conclusion     Prox Cx to Mid Cx lesion, 10 %stenosed.  Prox RCA lesion, 100 %stenosed. Chronic total occlusion.  2nd Diag  lesion, 45 %stenosed.  Prox LAD lesion, 100 %stenosed. This was the culprit lesion and treated with thrombectomy and angioplasty, > 4 mm. IVUS showed the vessel diameter at approximaltely 4 mm.  Post intervention, there is a 0% residual stenosis.  There is no aortic valve stenosis.  LV end diastolic pressure is severely elevated.  He needs aggressive risk factor modification. He needs to be compliant with dual antiplatelet therapy. We elected not to place any additional stents since there were questions about compliance. There was inadequate result with just balloon dilatation with intravascular ultrasound guidance. He states he was taking his Brilinta. In the event that he had missed some doses, we gave a dose of intravenous tirofiban. He was again loaded with oral Brilinta. He'll need aggressive medical therapy for his systolic dysfunction. Elevated LVEDP. Hold off on IV fluids at this point.     Patient Profile     41 y.o. male with known complex CAD with last cath on 10/19/16 POBA of LAD with no additional stent placement in the setting of STEMI (worried about compliance) here with mildly elevated troponin 0.23-0.3, NSTEMI (on 10/19/16 was 0.65)  Assessment & Plan    NSTEMI  - No STEMI on  ECG  - No CP, actually quite atypical, Coke..belch, relieved pain he states  - Trop was elevated mild 0.23-0.3 (could have been residual from prior STEMI??)  - Last cath as above, POBA with MED mgt  - No VT on monitor.   - Keep Coreg 12.5 BID. BP improved this admit.   HL  - statin  Chronic systolic HF  - NYHA 1-2 - comfortable  - No symptoms.   Tobacco cessation  - strongly encouraged.   - Significantly increases risk.   OK with DC with close follow up.   Donato Schultz, MD

## 2016-11-13 NOTE — Discharge Instructions (Signed)
Angina Pectoris Angina pectoris is a very bad feeling in the chest, neck, or arm. Your doctor may call it angina. There are four types of angina. Angina is caused by a lack of blood in the middle and thickest layer of the heart wall (myocardium). Angina may feel like a crushing or squeezing pain in the chest. It may feel like tightness or heavy pressure in the chest. Some people say it feels like gas, heartburn, or indigestion. Some people have symptoms other than pain. These include:  Shortness of breath.  Cold sweats.  Feeling sick to your stomach (nausea).  Feeling light-headed.  Many women have chest discomfort and some of the other symptoms. However, women often have different symptoms, such as:  Feeling tired (fatigue).  Feeling nervous for no reason.  Feeling weak for no reason.  Dizziness or fainting.  Women may have angina without any symptoms. Follow these instructions at home:  Take medicines only as told by your doctor.  Take care of other health issues as told by your doctor. These include: ? High blood pressure (hypertension). ? Diabetes.  Follow a heart-healthy diet. Your doctor can help you to choose healthy food options and make changes.  Talk to your doctor to learn more about healthy cooking methods and use them. These include: ? Roasting. ? Grilling. ? Broiling. ? Baking. ? Poaching. ? Steaming. ? Stir-frying.  Follow an exercise program approved by your doctor.  Keep a healthy weight. Lose weight as told by your doctor.  Rest when you are tired.  Learn to manage stress.  Do not use any tobacco, such as cigarettes, chewing tobacco, or electronic cigarettes. If you need help quitting, ask your doctor.  If you drink alcohol, and your doctor says it is okay, limit yourself to no more than 1 drink per day. One drink equals 12 ounces of beer, 5 ounces of wine, or 1 ounces of hard liquor.  Stop illegal drug use.  Keep all follow-up visits as told  by your doctor. This is important. Do not take these medicines unless your doctor says that you can:  Nonsteroidal anti-inflammatory drugs (NSAIDs). These include: ? Ibuprofen. ? Naproxen. ? Celecoxib.  Vitamin supplements that have vitamin A, vitamin E, or both.  Hormone therapy that contains estrogen with or without progestin.  Get help right away if:  You have pain in your chest, neck, arm, jaw, stomach, or back that: ? Lasts more than a few minutes. ? Comes back. ? Does not get better after you take medicine under your tongue (sublingual nitroglycerin).  You have any of these symptoms for no reason: ? Gas, heartburn, or indigestion. ? Sweating a lot. ? Shortness of breath or trouble breathing. ? Feeling sick to your stomach or throwing up. ? Feeling more tired than usual. ? Feeling nervous or worrying more than usual. ? Feeling weak. ? Diarrhea.  You are suddenly dizzy or light-headed.  You faint or pass out. These symptoms may be an emergency. Do not wait to see if the symptoms will go away. Get medical help right away. Call your local emergency services (911 in the [place] Do not drive yourself to the hospital. This information is not intended to replace advice given to you by your health care provider. Make sure you discuss any questions you have with your health care provider. Document Released: 01/18/2008 Document Revised: 01/07/2016 Document Reviewed: 12/03/2013 Elsevier Interactive Patient Education  2017 [employer]  

## 2016-11-16 ENCOUNTER — Emergency Department (HOSPITAL_COMMUNITY): Payer: Medicaid Other

## 2016-11-16 ENCOUNTER — Emergency Department (HOSPITAL_COMMUNITY)
Admission: EM | Admit: 2016-11-16 | Discharge: 2016-11-16 | Disposition: A | Discharge status: Home / Self Care | Payer: Medicaid Other | Attending: Dermatology | Admitting: Dermatology

## 2016-11-16 ENCOUNTER — Encounter (HOSPITAL_COMMUNITY): Payer: Self-pay | Admitting: *Deleted

## 2016-11-16 DIAGNOSIS — Z5321 Procedure and treatment not carried out due to patient leaving prior to being seen by health care provider: Secondary | ICD-10-CM | POA: Insufficient documentation

## 2016-11-16 DIAGNOSIS — R079 Chest pain, unspecified: Secondary | ICD-10-CM | POA: Insufficient documentation

## 2016-11-16 LAB — BASIC METABOLIC PANEL
Anion gap: 11 (ref 5–15)
BUN: 13 mg/dL (ref 6–20)
CO2: 25 mmol/L (ref 22–32)
Calcium: 9.3 mg/dL (ref 8.9–10.3)
Chloride: 101 mmol/L (ref 101–111)
Creatinine, Ser: 1.27 mg/dL — ABNORMAL HIGH (ref 0.61–1.24)
GFR calc Af Amer: >60 mL/min (ref >60)
GFR calc non Af Amer: >60 mL/min (ref >60)
Glucose, Bld: 156 mg/dL — ABNORMAL HIGH (ref 65–99)
Potassium: 3.3 mmol/L — ABNORMAL LOW (ref 3.5–5.1)
Sodium: 137 mmol/L (ref 135–145)

## 2016-11-16 LAB — CBC
HCT: 38.8 % — ABNORMAL LOW (ref 39.0–52.0)
Hemoglobin: 13.4 g/dL (ref 13.0–17.0)
MCH: 29.3 pg (ref 26.0–34.0)
MCHC: 34.5 g/dL (ref 30.0–36.0)
MCV: 84.9 fL (ref 78.0–100.0)
Platelets: 231 10*3/uL (ref 150–400)
RBC: 4.57 MIL/uL (ref 4.22–5.81)
RDW: 13.8 % (ref 11.5–15.5)
WBC: 10.7 10*3/uL — ABNORMAL HIGH (ref 4.0–10.5)

## 2016-11-16 LAB — I-STAT TROPONIN, ED: Troponin i, poc: 0.05 ng/mL (ref 0.00–0.08)

## 2016-11-16 NOTE — ED Triage Notes (Signed)
Pt to ED c/o centralized chest pain onset an hour ago while driving. Pt took two nitro and is pain free at present, does feel a little dizzy after taking nitro. Pt denies diaphoresis or sob. Has significant cardiac hx.

## 2016-11-16 NOTE — ED Notes (Signed)
Pt reports feeling better while sleeping in the waiting room. Encouraged pt to stay to be seen by a doctor. Pt sitting with girlfriend in waiting room

## 2016-11-16 NOTE — ED Notes (Signed)
Pt stated that he was feeling better and was going to leave. Pt advised that he should stay to be seen. Pt refused and left waiting area.

## 2016-11-16 NOTE — Progress Notes (Deleted)
Cardiology Office Note    Date:  11/16/2016   ID:  Peter Russo, DOB 05-Jul-1976, MRN [id number]  PCP:  Jaclyn Shaggy, MD  Cardiologist:  Dr. Eden Emms / Dr. Graciela Husbands (EP)  CC: post hosp fu - STEMI   History of Present Illness:  Peter Russo is a 41 y.o. male with a history of CAD s/p multiple previous PCIs to LAD and LCx and known CTO to RCA, ICM, HTN, HLD, obesity, GERD and pre diabetes who presents to clinic for post hospital follow up after recent admission for STEMI.   He has a long hx of CAD with previous stenting of the LAD that was complicated by in stent restenosis with anterior STEMI in 1/07 requiring repeat intervention.He represented with chest pain in 03/16/2016 and underwent cath with intervention to the apical LAD. He then presented back 03/24/16 with chest pain and peak troponin of 3.38. Underwent cardiac cath showing patent stents with no new culprits for his reported symptoms. Echo at that time showed EF of 30-35% with diffuse hypokinesis and G2DD. Was maintained on ASA, statin, BB, ACEi, and Brilinta, with Imdur/spironolactone.   He was seen by Dr. Eden Emms 9/17 and reported doing well, along with being compliant with home medications. Sherryll Burger was added to his medications. He was seen by Dr. Graciela Husbands in consideration for possible ICD placement but repeat echo showed improved EF to 40%.   He was recently admitted to [facility] 3/7-10/21/16 for anterior STEMI. On arrival, he was taken emergently to the cath lab. Culprit lesion, proximal LAD, was 100% stenosed. Balloon angioplasty alone was performed on the proximal LAD; no stents were placed given questionable medication compliance. He was also noted to have CTO of the RCA, 10% prox-mid Cx and 45% 2nd diagonal. Decision was made to treat him medically. He was continued on ASA/Brilitna, lipitor, coreg, losartan and imdur. Per discharge summary Entresto and spironolactone were discontinued.   I saw him for follow up on 11/01/16. He was only  taking ASA/Brilinta. Didn't have any other medications. He broke down in tears and felt overwhelmed. I re-prescribed Coreg, Losartan and atorvastatin. We stopped his Zoloft (had run out anyway) and I prescribed Wellbutrin with the hopes that it may help treat his depression and smoking cessation.   Today he presents to clinic for follow up.    Past Medical History:  Diagnosis Date  . Anxiety   . CAD S/P percutaneous coronary angioplasty    a. s/p BMS-mLAD 2010. b. DES to RCA 2012. c. 04/2015: DES to LCx and PCI to LAD c/b dissection with subsequent overlapping DES to mLAD - r/i for NSTEMI afterwards; d. 08/2015 Ant STEMI in setting of noncompliance->117mLAD ISR (DES); e. 03/2016 PCI of apical LAD; f. 03/2016 Relook Cath: patent LAD stents, D1 25, D2 45, LCX 10p/m, RCA 100p CTO-->Med Rx.  . Chronic combined systolic and diastolic CHF (congestive heart failure) (HCC)    a. 03/2016 Echo: EF 30-35%, Gr2 DD  . Depression   . Essential hypertension   . GAD (generalized anxiety disorder)   . GERD (gastroesophageal reflux disease)   . Hypercholesteremia   . Ischemic cardiomyopathy 09/14/2015   a. prior EF 25-35 percent at cath, 35-40 percent by echo; b. 03/2016 Echo: EF 30-35%, diff HK, Gr2 DD.  . Morbid obesity (HCC)   . Pre-diabetes   . ST elevation (STEMI) myocardial infarction involving left anterior descending coronary artery (HCC) 09/14/2015   3.0 x 16 mm Synergy DES to the LAD for ISR BMS  .  STEMI involving left anterior descending coronary artery (HCC) 10/19/2016   balloon angioplasty to proximal LAD, no PCI  . SVT (supraventricular tachycardia) (HCC) APRROX 10 YRS AGO   PRESUMED AVNRT, ECHO 12/07 WITH EF 65%, MILD LAE  . Tobacco abuse     Past Surgical History:  Procedure Laterality Date  . CARDIAC CATHETERIZATION N/A 04/23/2015   Procedure: Left Heart Cath and Coronary Angiography;  Surgeon: Wendall Stade, MD;  Location: [facility] INVASIVE CV LAB;  Service: Cardiovascular;  Laterality: N/A;  .  CARDIAC CATHETERIZATION N/A 04/23/2015   Procedure: Coronary Stent Intervention;  Surgeon: Tonny Bollman, MD;  Location: [facility] INVASIVE CV LAB;  Service: Cardiovascular;  Laterality: N/A;  . CARDIAC CATHETERIZATION N/A 09/14/2015   Procedure: Left Heart Cath and Coronary Angiography;  Surgeon: Lyn Records, MD;  LAD 100%, CFX 10% ISR, RCA 100% (chronic), EF 25-35%  . CARDIAC CATHETERIZATION N/A 09/14/2015   Procedure: Coronary Stent Intervention;  Surgeon: Lyn Records, MD;  Location: [facility] INVASIVE CV LAB;  Service: Cardiovascular;  Laterality: N/A; 3.0 x 16 mm Synergy DES LAD  . CARDIAC CATHETERIZATION  03/16/2016  . CARDIAC CATHETERIZATION N/A 03/16/2016   Procedure: Left Heart Cath and Coronary Angiography;  Surgeon: Lyn Records, MD;  Location: [facility] INVASIVE CV LAB;  Service: Cardiovascular;  Laterality: N/A;  . CARDIAC CATHETERIZATION N/A 03/16/2016   Procedure: Coronary Balloon Angioplasty;  Surgeon: Lyn Records, MD;  Location: [facility] INVASIVE CV LAB;  Service: Cardiovascular;  Laterality: N/A;  . CARDIAC CATHETERIZATION N/A 03/25/2016   Procedure: Left Heart Cath and Coronary Angiography;  Surgeon: Peter M Swaziland, MD;  Location: [facility] INVASIVE CV LAB;  Service: Cardiovascular;  Laterality: N/A;  . CORONARY ANGIOPLASTY WITH STENT PLACEMENT  2010; 2013  . CORONARY BALLOON ANGIOPLASTY N/A 10/19/2016   Procedure: Coronary Balloon Angioplasty;  Surgeon: Corky Crafts, MD;  Location: [facility] INVASIVE CV LAB;  Service: Cardiovascular;  Laterality: N/A;  . INTRAVASCULAR ULTRASOUND/IVUS N/A 10/19/2016   Procedure: Intravascular Ultrasound/IVUS;  Surgeon: Corky Crafts, MD;  Location: [facility] INVASIVE CV LAB;  Service: Cardiovascular;  Laterality: N/A;  . LEFT HEART CATH AND CORONARY ANGIOGRAPHY N/A 10/19/2016   Procedure: Left Heart Cath and Coronary Angiography;  Surgeon: Corky Crafts, MD;  Location: [facility] INVASIVE CV LAB;  Service: Cardiovascular;  Laterality: N/A;    Current Medications: Outpatient  Medications Prior to Visit  Medication Sig Dispense Refill  . acetaminophen (TYLENOL) 325 MG tablet Take 2 tablets (650 mg total) by mouth every 4 (four) hours as needed for headache or mild pain.    Marland Kitchen aspirin 81 MG EC tablet Take 1 tablet (81 mg total) by mouth daily. 90 tablet 3  . atorvastatin (LIPITOR) 80 MG tablet Take 1 tablet (80 mg total) by mouth daily at 6 PM. 90 tablet 3  . buPROPion (WELLBUTRIN SR) 150 MG 12 hr tablet Take 1 tablet (150 mg total) by mouth 2 (two) times daily. 60 tablet 11  . carvedilol (COREG) 12.5 MG tablet Take 1 tablet (12.5 mg total) by mouth 2 (two) times daily. 60 tablet 6  . fenofibrate (TRICOR) 48 MG tablet Take 1 tablet (48 mg total) by mouth daily. 30 tablet 11  . isosorbide mononitrate (IMDUR) 30 MG 24 hr tablet Take 1 tablet (30 mg total) by mouth daily. 30 tablet 6  . losartan (COZAAR) 25 MG tablet Take 1 tablet (25 mg total) by mouth daily. 30 tablet 11  . nitroGLYCERIN (NITROSTAT) 0.4 MG SL tablet Place 1 tablet (0.4 mg  total) under the tongue every 5 (five) minutes as needed for chest pain (3 x). 25 tablet 2  . ticagrelor (BRILINTA) 90 MG TABS tablet Take 1 tablet (90 mg total) by mouth 2 (two) times daily. 180 tablet 3   No facility-administered medications prior to visit.      Allergies:   Patient has no known allergies.   Social History   Social History  . Marital status: Single    Spouse name: N/A  . Number of children: N/A  . 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 Smoking status: Current Some Day Smoker    Packs/day: 1.00    Years: 24.00    Types: Cigarettes  . Smokeless tobacco: Never Used     Comment: quit on 03/15/16  . Alcohol use No  . Drug use: Yes    Types: Marijuana     Comment: last time 02/2016  . Sexual activity: Yes   Other Topics Concern  . Not on file   Social History Narrative   REGULAR EXERCISE     Family History:  The patient's family  history includes Arrhythmia in his mother; Coronary artery disease in his mother; Coronary artery disease (age of onset: 26) in his father; Heart attack in his brother, father, and mother; Heart disease in his father; Hypertension in his mother.      ROS:   Please see the history of present illness.    ROS All other systems reviewed and are negative.   PHYSICAL EXAM:   VS:  There were no vitals taken for this visit.   GEN: Well nourished, well developed, in no acute distress  HEENT: normal  Neck: no JVD, carotid bruits, or masses Cardiac: RRR; no murmurs, rubs, or gallops,no edema  Respiratory:  clear to auscultation bilaterally, normal work of breathing GI: soft, nontender, nondistended, + BS MS: no deformity or atrophy  Skin: warm and dry, no rash Neuro:  Alert and Oriented x 3, Strength and sensation are intact Psych: euthymic mood, full affect   Wt Readings from Last 3 Encounters:  11/12/16 274 lb 8 oz (124.5 kg)  11/01/16 275 lb 12.8 oz (125.1 kg)  10/21/16 260 lb 1.6 oz (118 kg)      Studies/Labs Reviewed:   EKG:  EKG is NOT ordered today.  Recent Labs: 03/17/2016: Magnesium 1.8 10/19/2016: ALT 38 11/11/2016: B Natriuretic Peptide 190.0 11/16/2016: BUN 13; Creatinine, Ser 1.27; Hemoglobin 13.4; Platelets 231; Potassium 3.3; Sodium 137   Lipid Panel    Component Value Date/Time   CHOL 279 (H) 10/19/2016 1312   TRIG 313 (H) 10/19/2016 1312   HDL 33 (L) 10/19/2016 1312   CHOLHDL 8.5 10/19/2016 1312   VLDL 63 (H) 10/19/2016 1312   LDLCALC 183 (H) 10/19/2016 1312    Additional studies/ records that were reviewed today include:  2D Echocardiogram8.11.2017 Study Conclusions - Left ventricle: The cavity size was normal. Wall thickness was increased in a pattern of mild LVH. Systolic function was moderately to severely reduced. The estimated ejection fraction was in the range of 30% to 35%. Diffuse hypokinesis. There is akinesis of the apical myocardium.  Features are consistent with a pseudonormal left ventricular filling pattern, with concomitant abnormal relaxation and increased filling pressure (grade 2 diastolic dysfunction). Impressions: - Technically difficult; definity used; diffuse hypokinesis with apical akinesis; overall moderate to severe LV dysfunction; grade 2 diastolic dysfunction; indeterminant LV filling pressure. _____________  Cardiac  Catheterization8.11.2017  Coronary Findings  Dominance: Right  Left Anterior Descending  Prox LAD lesion, 0% stenosed. Prox LAD lesion with no stenosis was previously treated.  Prox LAD to Dist LAD lesion, 0% stenosed. Prox LAD to Dist LAD lesion with no stenosis was previously treated.  Dist LAD lesion, 25% stenosed.  First Diagonal Branch  Vessel is small in size.  Second Diagonal Branch  2nd Diag lesion, 45% stenosed. The lesion is dissected.  Left Circumflex  Prox Cx to Mid Cx lesion, 10% stenosed. The lesion was previously treated.  Third Obtuse Marginal Branch  Vessel is small in size.  Right Coronary Artery  Dist RCA filled by collaterals from Prox RCA.  Prox RCA lesion, 100% stenosed.   Coronary Diagrams     _____________  10/19/16 Coronary Balloon Angioplasty  Intravascular Ultrasound/IVUS  Left Heart Cath and Coronary Angiography  Conclusion     Prox Cx to Mid Cx lesion, 10 %stenosed.  Prox RCA lesion, 100 %stenosed. Chronic total occlusion.  2nd Diag lesion, 45 %stenosed.  Prox LAD lesion, 100 %stenosed. This was the culprit lesion and treated with thrombectomy and angioplasty, > 4 mm. IVUS showed the vessel diameter at approximaltely 4 mm.  Post intervention, there is a 0% residual stenosis.  There is no aortic valve stenosis.  LV end diastolic pressure is severely elevated.   He needs aggressive risk factor modification. He needs to be compliant with dual antiplatelet therapy. We elected not to place any additional stents since there  were questions about compliance. There was inadequate result with just balloon dilatation with intravascular ultrasound guidance. He states he was taking his Brilinta. In the event that he had missed some doses, we gave a dose of intravenous tirofiban. He was again loaded with oral Brilinta. He'll need aggressive medical therapy for his systolic dysfunction. Elevated LVEDP. Hold off on IV fluids at this point.  He will be watched in the CCU.     ASSESSMENT & PLAN:   CAD:   Ischemic CM:   HTN:   HLD:   Obesity:   Pre diabetes:    Tobacco abuse:    Depression:   Medication Adjustments/Labs and Tests Ordered: Current medicines are reviewed at length with the patient today.  Concerns regarding medicines are outlined above.  Medication changes, Labs and Tests ordered today are listed in the Patient Instructions below. There are no Patient Instructions on file for this visit.   Signed, Cline Crock, PA-C  11/16/2016 8:25 PM    [facility] [address] [place], [place], [place]  [address] Phone: [phone]; Fax: [phone]

## 2016-11-17 ENCOUNTER — Inpatient Hospital Stay
Admission: EM | Admit: 2016-11-17 | Discharge: 2016-11-18 | DRG: 303 | Disposition: A | Discharge status: Home / Self Care | Payer: Medicare Other | Attending: Internal Medicine | Admitting: Internal Medicine

## 2016-11-17 ENCOUNTER — Encounter: Payer: Self-pay | Admitting: *Deleted

## 2016-11-17 ENCOUNTER — Emergency Department: Payer: Medicare Other

## 2016-11-17 ENCOUNTER — Ambulatory Visit: Payer: Medicaid Other | Admitting: Physician Assistant

## 2016-11-17 ENCOUNTER — Telehealth: Payer: Self-pay

## 2016-11-17 DIAGNOSIS — I252 Old myocardial infarction: Secondary | ICD-10-CM

## 2016-11-17 DIAGNOSIS — Z955 Presence of coronary angioplasty implant and graft: Secondary | ICD-10-CM

## 2016-11-17 DIAGNOSIS — K219 Gastro-esophageal reflux disease without esophagitis: Secondary | ICD-10-CM | POA: Diagnosis present

## 2016-11-17 DIAGNOSIS — Z8249 Family history of ischemic heart disease and other diseases of the circulatory system: Secondary | ICD-10-CM | POA: Diagnosis not present

## 2016-11-17 DIAGNOSIS — Z7982 Long term (current) use of aspirin: Secondary | ICD-10-CM

## 2016-11-17 DIAGNOSIS — Z9114 Patient's other noncompliance with medication regimen: Secondary | ICD-10-CM | POA: Diagnosis not present

## 2016-11-17 DIAGNOSIS — R0789 Other chest pain: Secondary | ICD-10-CM

## 2016-11-17 DIAGNOSIS — E7800 Pure hypercholesterolemia, unspecified: Secondary | ICD-10-CM | POA: Diagnosis present

## 2016-11-17 DIAGNOSIS — E785 Hyperlipidemia, unspecified: Secondary | ICD-10-CM | POA: Diagnosis present

## 2016-11-17 DIAGNOSIS — R079 Chest pain, unspecified: Secondary | ICD-10-CM | POA: Diagnosis not present

## 2016-11-17 DIAGNOSIS — I25110 Atherosclerotic heart disease of native coronary artery with unstable angina pectoris: Secondary | ICD-10-CM | POA: Diagnosis present

## 2016-11-17 DIAGNOSIS — I208 Other forms of angina pectoris: Secondary | ICD-10-CM | POA: Diagnosis not present

## 2016-11-17 DIAGNOSIS — F329 Major depressive disorder, single episode, unspecified: Secondary | ICD-10-CM | POA: Diagnosis present

## 2016-11-17 DIAGNOSIS — Z87891 Personal history of nicotine dependence: Secondary | ICD-10-CM

## 2016-11-17 DIAGNOSIS — I110 Hypertensive heart disease with heart failure: Secondary | ICD-10-CM | POA: Diagnosis present

## 2016-11-17 DIAGNOSIS — Z79899 Other long term (current) drug therapy: Secondary | ICD-10-CM | POA: Diagnosis not present

## 2016-11-17 DIAGNOSIS — I255 Ischemic cardiomyopathy: Secondary | ICD-10-CM | POA: Diagnosis not present

## 2016-11-17 DIAGNOSIS — I25118 Atherosclerotic heart disease of native coronary artery with other forms of angina pectoris: Secondary | ICD-10-CM | POA: Diagnosis not present

## 2016-11-17 DIAGNOSIS — R7989 Other specified abnormal findings of blood chemistry: Secondary | ICD-10-CM

## 2016-11-17 DIAGNOSIS — R778 Other specified abnormalities of plasma proteins: Secondary | ICD-10-CM

## 2016-11-17 DIAGNOSIS — I5042 Chronic combined systolic (congestive) and diastolic (congestive) heart failure: Secondary | ICD-10-CM | POA: Diagnosis present

## 2016-11-17 DIAGNOSIS — Z6841 Body Mass Index (BMI) 40.0 and over, adult: Secondary | ICD-10-CM

## 2016-11-17 DIAGNOSIS — R748 Abnormal levels of other serum enzymes: Secondary | ICD-10-CM | POA: Diagnosis not present

## 2016-11-17 DIAGNOSIS — F411 Generalized anxiety disorder: Secondary | ICD-10-CM | POA: Diagnosis present

## 2016-11-17 DIAGNOSIS — E782 Mixed hyperlipidemia: Secondary | ICD-10-CM

## 2016-11-17 DIAGNOSIS — I2089 Other forms of angina pectoris: Secondary | ICD-10-CM

## 2016-11-17 LAB — CBC
HCT: 41.1 % (ref 40.0–52.0)
Hemoglobin: 14 g/dL (ref 13.0–18.0)
MCH: 29.3 pg (ref 26.0–34.0)
MCHC: 34.1 g/dL (ref 32.0–36.0)
MCV: 85.9 fL (ref 80.0–100.0)
Platelets: 227 10*3/uL (ref 150–440)
RBC: 4.78 MIL/uL (ref 4.40–5.90)
RDW: 14.7 % — ABNORMAL HIGH (ref 11.5–14.5)
WBC: 11.5 10*3/uL — ABNORMAL HIGH (ref 3.8–10.6)

## 2016-11-17 LAB — BASIC METABOLIC PANEL
Anion gap: 6 (ref 5–15)
BUN: 12 mg/dL (ref 6–20)
CO2: 25 mmol/L (ref 22–32)
Calcium: 9.2 mg/dL (ref 8.9–10.3)
Chloride: 106 mmol/L (ref 101–111)
Creatinine, Ser: 1.14 mg/dL (ref 0.61–1.24)
GFR calc Af Amer: >60 mL/min (ref >60)
GFR calc non Af Amer: >60 mL/min (ref >60)
Glucose, Bld: 187 mg/dL — ABNORMAL HIGH (ref 65–99)
Potassium: 3.6 mmol/L (ref 3.5–5.1)
Sodium: 137 mmol/L (ref 135–145)

## 2016-11-17 LAB — TSH: TSH: 1.92 u[IU]/mL (ref 0.350–4.500)

## 2016-11-17 LAB — TROPONIN I
Troponin I: 0.61 ng/mL (ref <0.03)
Troponin I: 0.56 ng/mL (ref <0.03)
Troponin I: 0.57 ng/mL (ref <0.03)
Troponin I: 0.58 ng/mL (ref <0.03)

## 2016-11-17 MED ORDER — LOSARTAN POTASSIUM 25 MG PO TABS
25.0000 mg | ORAL_TABLET | Freq: Every day | ORAL | Status: DC
  Administered 2016-11-17 – 2016-11-18 (×2): 25 mg via ORAL
  Filled 2016-11-17 (×2): qty 1

## 2016-11-17 MED ORDER — ATORVASTATIN CALCIUM 20 MG PO TABS
80.0000 mg | ORAL_TABLET | Freq: Every day | ORAL | Status: DC
  Administered 2016-11-17: 80 mg via ORAL
  Filled 2016-11-17: qty 4

## 2016-11-17 MED ORDER — FENOFIBRATE 54 MG PO TABS
54.0000 mg | ORAL_TABLET | Freq: Every day | ORAL | Status: DC
  Administered 2016-11-17 – 2016-11-18 (×2): 54 mg via ORAL
  Filled 2016-11-17 (×2): qty 1

## 2016-11-17 MED ORDER — ISOSORBIDE MONONITRATE ER 30 MG PO TB24
30.0000 mg | ORAL_TABLET | Freq: Every day | ORAL | Status: DC
  Administered 2016-11-17: 30 mg via ORAL
  Filled 2016-11-17: qty 1

## 2016-11-17 MED ORDER — SODIUM CHLORIDE 0.9 % IV SOLN
INTRAVENOUS | Status: DC
  Administered 2016-11-17: 04:00:00 via INTRAVENOUS

## 2016-11-17 MED ORDER — DOCUSATE SODIUM 100 MG PO CAPS
100.0000 mg | ORAL_CAPSULE | Freq: Two times a day (BID) | ORAL | Status: DC
  Administered 2016-11-17: 100 mg via ORAL
  Filled 2016-11-17 (×3): qty 1

## 2016-11-17 MED ORDER — ISOSORBIDE MONONITRATE ER 30 MG PO TB24
30.0000 mg | ORAL_TABLET | Freq: Every day | ORAL | Status: DC
  Administered 2016-11-18: 30 mg via ORAL
  Filled 2016-11-17: qty 1

## 2016-11-17 MED ORDER — ACETAMINOPHEN 325 MG PO TABS: 650.0000 mg | ORAL_TABLET | Freq: Four times a day (QID) | ORAL | Status: DC | PRN

## 2016-11-17 MED ORDER — ENOXAPARIN SODIUM 40 MG/0.4ML ~~LOC~~ SOLN
40.0000 mg | Freq: Two times a day (BID) | SUBCUTANEOUS | Status: DC
  Administered 2016-11-17 – 2016-11-18 (×3): 40 mg via SUBCUTANEOUS
  Filled 2016-11-17 (×3): qty 0.4

## 2016-11-17 MED ORDER — ASPIRIN EC 81 MG PO TBEC
81.0000 mg | DELAYED_RELEASE_TABLET | Freq: Every day | ORAL | Status: DC
  Administered 2016-11-17 – 2016-11-18 (×2): 81 mg via ORAL
  Filled 2016-11-17 (×2): qty 1

## 2016-11-17 MED ORDER — NITROGLYCERIN 0.4 MG SL SUBL: 0.4000 mg | SUBLINGUAL_TABLET | SUBLINGUAL | Status: DC | PRN

## 2016-11-17 MED ORDER — ACETAMINOPHEN 650 MG RE SUPP: 650.0000 mg | Freq: Four times a day (QID) | RECTAL | Status: DC | PRN

## 2016-11-17 MED ORDER — TICAGRELOR 90 MG PO TABS
90.0000 mg | ORAL_TABLET | Freq: Two times a day (BID) | ORAL | Status: DC
  Administered 2016-11-17 – 2016-11-18 (×3): 90 mg via ORAL
  Filled 2016-11-17 (×3): qty 1

## 2016-11-17 MED ORDER — BUPROPION HCL ER (SR) 150 MG PO TB12
150.0000 mg | ORAL_TABLET | Freq: Two times a day (BID) | ORAL | Status: DC
  Administered 2016-11-17 – 2016-11-18 (×3): 150 mg via ORAL
  Filled 2016-11-17 (×4): qty 1

## 2016-11-17 MED ORDER — ACETAMINOPHEN 325 MG PO TABS: 650.0000 mg | ORAL_TABLET | ORAL | Status: DC | PRN

## 2016-11-17 MED ORDER — CARVEDILOL 12.5 MG PO TABS
12.5000 mg | ORAL_TABLET | Freq: Two times a day (BID) | ORAL | Status: DC
  Administered 2016-11-17 – 2016-11-18 (×3): 12.5 mg via ORAL
  Filled 2016-11-17 (×3): qty 1

## 2016-11-17 MED ORDER — REGADENOSON 0.4 MG/5ML IV SOLN
0.4000 mg | Freq: Once | INTRAVENOUS | Status: AC
  Administered 2016-11-18: 0.4 mg via INTRAVENOUS
  Filled 2016-11-17: qty 5

## 2016-11-17 MED ORDER — ONDANSETRON HCL 4 MG/2ML IJ SOLN: 4.0000 mg | Freq: Four times a day (QID) | INTRAMUSCULAR | Status: DC | PRN

## 2016-11-17 MED ORDER — ONDANSETRON HCL 4 MG PO TABS: 4.0000 mg | ORAL_TABLET | Freq: Four times a day (QID) | ORAL | Status: DC | PRN

## 2016-11-17 NOTE — H&P (Signed)
Peter Russo is an 41 y.o. male.   Chief Complaint: Chest pain HPI: The patient with past medical history of coronary artery disease and hyperlipidemia presents to the emergency department complaining of pain in his chest and neck. The patient states that he awoke this morning with substernal pain that radiated into his neck. He took 2 nitroglycerin tablets which eventually made the pain to ease off and allowed the patient to fall back to sleep. He woke again this afternoon and immediately felt some discomfort in his chest. He went to dinner and only ate one slice of pizza because the discomfort was growing in severity. Once he returned home he took 2 nitroglycerin tablets yet again without relief which prompted him to call EMS for evaluation. In the emergency department the patient endorses sensation similar to reflux or heartburn. However laboratory evaluation showed elevated troponin. The patient's EKG is unchanged from the last study which was 6 days ago at which time he ruled out for myocardial ischemia. Notably, the patient ruled in for ST elevation MI 4 weeks ago at which time he received 4 stents. Due to his cardiac risk factors and ongoing yet intermittent chest pain the emergency department staff called the hospitalist service for admission.  Past Medical History:  Diagnosis Date  . Anxiety   . CAD S/P percutaneous coronary angioplasty    a. s/p BMS-mLAD 2010. b. DES to RCA 2012. c. 04/2015: DES to LCx and PCI to LAD c/b dissection with subsequent overlapping DES to mLAD - r/i for NSTEMI afterwards; d. 08/2015 Ant STEMI in setting of noncompliance->141mAD ISR (DES); e. 03/2016 PCI of apical LAD; f. 03/2016 Relook Cath: patent LAD stents, D1 25, D2 45, LCX 10p/m, RCA 100p CTO-->Med Rx.  . Chronic combined systolic and diastolic CHF (congestive heart failure) [facility]    a. 03/2016 Echo: EF 30-35%, Gr2 DD  . Depression   . Essential hypertension   . GAD (generalized anxiety disorder)   . GERD  (gastroesophageal reflux disease)   . Hypercholesteremia   . Ischemic cardiomyopathy 09/14/2015   a. prior EF 25-35 percent at cath, 35-40 percent by echo; b. 03/2016 Echo: EF 30-35%, diff HK, Gr2 DD.  . Morbid [place] ([facility]   . Pre-diabetes   . ST elevation (STEMI) myocardial infarction involving left anterior descending coronary artery ([place] 09/14/2015   3.0 x 16 mm Synergy DES to the LAD for ISR BMS  . STEM[place] involving [place] anterior descending coronary artery [place] 10/19[place]   balloon angioplasty to proximal LAD, no PCI  . SVT (supraventricular tachycardia) (HCC) APRROX 10 YRS AGO   PRESUMED AVNRT, ECHO 12/07 WITH EF 65%, MILD LAE  . Tobacco abuse     Past Surgical History:  Procedure Laterality Date  . CARDIAC CATHETERIZATION N/A 04/23/2015   Procedure: Left Heart Cath and Coronary Angiography;  Surgeon: PJosue Hector MD;  [facility];  Service: Cardiovascular;  Laterality: N/A;  . CARDIAC CATHETERIZATION N/A 04/23/2015   Procedure: Coronary Stent Intervention;  Surgeon: MSherren Mocha MD;  [facility];  Service: Cardiovascular;  Laterality: N/A;  . CARDIAC CATHETERIZATION N/A 09/14/2015   Procedure: Left Heart Cath and Coronary Angiography;  Surgeon: HBelva Crome MD;  LAD 100%, CFX 10% ISR, RCA 100% (chronic), EF 25-35%  . CARDIAC CATHETERIZATION N/A 09/14/2015   Procedure: Coronary Stent Intervention;  Surgeon: HBelva Crome MD;  [facility];  Service: Cardiovascular;  Laterality: N/A; 3.0 x 16 mm Synergy DES  LAD  . CARDIAC CATHETERIZATION  03/16/2016  . CARDIAC CATHETERIZATION N/A 03/16/2016   Procedure: Left Heart Cath and Coronary Angiography;  Surgeon: Belva Crome, MD;  Location: [place] [facility];  Service: Cardiovascular;  Laterality: N/A;  . CARDIAC CATHETERIZATION N/A 03/16/2016   Procedure: Coronary Balloon Angioplasty;  Surgeon: Belva Crome, MD;  Location: [place] [facility];  Service: Cardiovascular;  Laterality: N/A;   . CARDIAC CATHETERIZATION N/A 03/25/2016   Procedure: Left Heart Cath and Coronary Angiography;  Surgeon: Peter M Martinique, MD;  Location: [place] [facility];  Service: Cardiovascular;  Laterality: N/A;  . CORONARY ANGIOPLASTY WITH STENT PLACEMENT  2010; 2013  . CORONARY BALLOON ANGIOPLASTY N/A 10/19/2016   Procedure: Coronary Balloon Angioplasty;  Surgeon: Jettie Booze, MD;  Location: [place] [facility];  Service: Cardiovascular;  Laterality: N/A;  . INTRAVASCULAR ULTRASOUND/IVUS N/A 10/19/2016   Procedure: Intravascular Ultrasound/IVUS;  Surgeon: Jettie Booze, MD;  Location: [place] [facility];  Service: Cardiovascular;  Laterality: N/A;  . LEFT HEART CATH AND CORONARY ANGIOGRAPHY N/A 10/19/2016   Procedure: Left Heart Cath and Coronary Angiography;  Surgeon: Jettie Booze, MD;  Location: [place] [facility];  Service: Cardiovascular;  Laterality: N/A;    Family History  Problem Relation Age of Onset  . Arrhythmia Mother     MOTHER LIVED TO BE 89  . Coronary artery disease Mother   . Heart attack Mother   . Hypertension Mother   . Coronary artery disease Father 68  . Heart disease Father   . Heart attack Father   . Heart attack Brother   . Stroke Neg Hx    Social History:  reports that he quit smoking about 2 weeks ago. He has a 24.00 pack-year smoking history. He has never used smokeless tobacco. He reports that he uses drugs, including Marijuana. He reports that he does not drink alcohol.  Allergies: No Known Allergies  Medications Prior to Admission  Medication Sig Dispense Refill  . acetaminophen (TYLENOL) 325 MG tablet Take 2 tablets (650 mg total) by mouth every 4 (four) hours as needed for headache or mild pain.    Marland Kitchen aspirin 81 MG EC tablet Take 1 tablet (81 mg total) by mouth daily. 90 tablet 3  . atorvastatin (LIPITOR) 80 MG tablet Take 1 tablet (80 mg total) by mouth daily at 6 PM. 90 tablet 3  . buPROPion (WELLBUTRIN SR) 150 MG 12 hr tablet Take 1 tablet  (150 mg total) by mouth 2 (two) times daily. 60 tablet 11  . carvedilol (COREG) 12.5 MG tablet Take 1 tablet (12.5 mg total) by mouth 2 (two) times daily. 60 tablet 6  . fenofibrate (TRICOR) 48 MG tablet Take 1 tablet (48 mg total) by mouth daily. 30 tablet 11  . isosorbide mononitrate (IMDUR) 30 MG 24 hr tablet Take 1 tablet (30 mg total) by mouth daily. 30 tablet 6  . losartan (COZAAR) 25 MG tablet Take 1 tablet (25 mg total) by mouth daily. 30 tablet 11  . nitroGLYCERIN (NITROSTAT) 0.4 MG SL tablet Place 1 tablet (0.4 mg total) under the tongue every 5 (five) minutes as needed for chest pain (3 x). 25 tablet 2  . ticagrelor (BRILINTA) 90 MG TABS tablet Take 1 tablet (90 mg total) by mouth 2 (two) times daily. 180 tablet 3    Results for orders placed or performed during the hospital encounter of 11/17/16 (from the past 48 hour(s))  Basic metabolic panel  Status: Abnormal   Collection Time: 11/17/16  1:15 AM  Result Value Ref Range   Sodium 137 135 - 145 mmol/L   Potassium 3.6 3.5 - 5.1 mmol/L   Chloride 106 101 - 111 mmol/L   CO2 25 22 - 32 mmol/L   Glucose, Bld 187 (H) 65 - 99 mg/dL   BUN 12 6 - 20 mg/dL   Creatinine, Ser 1.14 0.61 - 1.24 mg/dL   Calcium 9.2 8.9 - 10.3 mg/dL   GFR calc non Af Amer >60 >60 mL/min   GFR calc Af Amer >60 >60 mL/min    Comment: (NOTE) The eGFR has been calculated using the CKD EPI equation. This calculation has not been validated in all clinical situations. eGFR's persistently <60 mL/min signify possible Chronic Kidney Disease.    Anion gap 6 5 - 15  CBC     Status: Abnormal   Collection Time: 11/17/16  1:15 AM  Result Value Ref Range   WBC 11.5 (H) 3.8 - 10.6 K/uL   RBC 4.78 4.40 - 5.90 MIL/uL   Hemoglobin 14.0 13.0 - 18.0 g/dL   HCT 41.1 40.0 - 52.0 %   MCV 85.9 80.0 - 100.0 fL   MCH 29.3 26.0 - 34.0 pg   MCHC 34.1 32.0 - 36.0 g/dL   RDW 14.7 (H) 11.5 - 14.5 %   Platelets 227 150 - 440 K/uL  Troponin I     Status: Abnormal   Collection  Time: 11/17/16  1:15 AM  Result Value Ref Range   Troponin I 0.61 (HH) <0.03 ng/mL    Comment: CRITICAL RESULT CALLED TO, READ BACK BY AND VERIFIED WITH AMY COYNE ON 11/17/16 AT 0217 BY TLB    Dg Chest 2 View  Result Date: 11/17/2016 CLINICAL DATA:  41 y/o  M; chest pain. EXAM: CHEST  2 VIEW COMPARISON:  11/16/2016 chest radiograph. FINDINGS: Stable cardiac silhouette. Linear opacities in the left mid lung zone are stable and likely represent atelectasis or scarring. No focal consolidation. No pleural effusion or pneumothorax. Bones are unremarkable. IMPRESSION: No active cardiopulmonary disease. Electronically Signed   By: Kristine Garbe M.D.   On: 11/17/2016 02:52   Dg Chest 2 View  Result Date: 11/16/2016 CLINICAL DATA:  Chest pain tonight, resolved with nitroglycerin. EXAM: CHEST  2 VIEW COMPARISON:  11/11/2016 FINDINGS: The lungs are clear. The pulmonary vasculature is normal. Unchanged borderline heart size. Hilar and mediastinal contours are unremarkable. There is no pleural effusion. IMPRESSION: No active cardiopulmonary disease. Electronically Signed   By: Andreas Newport M.D.   On: 11/16/2016 03:58    Review of Systems  Constitutional: Negative for chills and fever.  HENT: Negative for sore throat and tinnitus.   Eyes: Negative for blurred vision and redness.  Respiratory: Negative for cough and shortness of breath.   Cardiovascular: Negative for chest pain, palpitations, orthopnea and PND.  Gastrointestinal: Positive for heartburn. Negative for abdominal pain, diarrhea, nausea and vomiting.  Genitourinary: Negative for dysuria, frequency and urgency.  Musculoskeletal: Negative for joint pain and myalgias.  Skin: Negative for rash.       No lesions  Neurological: Positive for dizziness. Negative for speech change, focal weakness and weakness.  Endo/Heme/Allergies: Does not bruise/bleed easily.       No temperature intolerance  Psychiatric/Behavioral: Negative for  depression and suicidal ideas.    Blood pressure 116/75, pulse 79, temperature 97.8 F (36.6 C), temperature source Oral, resp. rate 18, height 5' 7"  (1.702 m), weight 122.5 kg (270  lb), SpO2 98 %. Physical Exam  Vitals reviewed. Constitutional: He is oriented to person, place, and time. He appears well-developed and well-nourished. No distress.  HENT:  Head: Normocephalic and atraumatic.  Mouth/Throat: Oropharynx is clear and moist.  Eyes: Conjunctivae and EOM are normal. Pupils are equal, round, and reactive to light. Right eye exhibits no discharge. Left eye exhibits no discharge. No scleral icterus.  Neck: Normal range of motion. Neck supple. No JVD present. No tracheal deviation present. No thyromegaly present.  Cardiovascular: Normal rate, regular rhythm and normal heart sounds.  Exam reveals no gallop and no friction rub.   No murmur heard. Respiratory: Effort normal and breath sounds normal. No respiratory distress.  GI: Soft. Bowel sounds are normal. He exhibits no distension. There is no tenderness.  Genitourinary:  Genitourinary Comments: Deferred  Musculoskeletal: Normal range of motion. He exhibits no edema.  Lymphadenopathy:    He has no cervical adenopathy.  Neurological: He is alert and oriented to person, place, and time. No cranial nerve deficit.  Skin: Skin is warm and dry. No rash noted. No erythema.  Psychiatric: He has a normal mood and affect. His behavior is normal. Judgment and thought content normal.     Assessment/Plan This is a 41 year old male admitted for chest pain. 1. Chest pain: Atypical, however the patient does have elevated troponin. Continue to follow cardiac biomarkers. Monitor telemetry. Recheck EKG. Nitroglycerin as needed. Consult cardiology. The patient is nothing by mouth except for meds and sips 2. CAD: Status post PCI. Continue Brilinta and aspirin. Manage blood pressure. Continue Imdur 3. Hypertension: Controlled; continue Coreg and  losartan 4. Hyperlipidemia: Continue statin therapy as well as fibrate therapy 5. Depression: Continue Wellbutrin 6. DVT prophylaxis: Lovenox 7. GI prophylaxis: None The patient is a full code. Time spent on admission orders and patient care approximately 45 minutes  Harrie Foreman, MD 11/17/2016, 4:50 AM

## 2016-11-17 NOTE — Progress Notes (Signed)
Lovenox changed to 40 mg BID for BMI >40 qand CrCl >30.

## 2016-11-17 NOTE — ED Notes (Signed)
ED Provider at bedside. 

## 2016-11-17 NOTE — Progress Notes (Signed)
Inpatient Diabetes Program Recommendations  AACE/ADA: New Consensus Statement on Inpatient Glycemic Control (2015)  Target Ranges:  Prepandial:   less than 140 mg/dL      Peak postprandial:   less than 180 mg/dL (1-2 hours)      Critically ill patients:  140 - 180 mg/dL   Lab Results  Component Value Date   GLUCAP 97 03/16/2016   HGBA1C 6.1 (H) 10/19/2016    Review of Glycemic Control  Lab glucose-- Results for BREAKER, SPRINGER (MRN [id number]) as of 11/17/2016 13:02  Ref. Range 11/16/2016 03:26 11/16/2016 03:41 11/17/2016 01:15 11/17/2016 03:56 11/17/2016 09:51  Glucose Latest Ref Range: 65 - 99 mg/dL 621 (H)  308 (H)      Diabetes history: pre-diabetes Outpatient Diabetes medications: none Current orders for Inpatient glycemic control: none  Inpatient Diabetes Program Recommendations:  Elevated lab glucose this admission- consider checking CBG tid and hs and order Novolog 0-9 units tid and Novolog 0-5 units qhs  Susette Racer, RN, [place], [place], CDE Diabetes Coordinator Inpatient Diabetes Program  [phone] (Team Pager) [phone] [facility] Office) 11/17/2016 1:08 PM

## 2016-11-17 NOTE — ED Triage Notes (Signed)
Pt brought in via ems from home.  Pt reports chest pain since 1000am today.   Pt took 4 ntg through out the day with some relief.  No n/v/d.  Pt alert.   md at bedside.

## 2016-11-17 NOTE — Consult Note (Signed)
Cardiology Consult    Patient ID: Peter Russo MRN: [id number], DOB/AGE: 04-18-1976   Admit date: 11/17/2016 Date of Consult: 11/17/2016  Primary Physician: Jaclyn Shaggy, MD Primary Cardiologist: P. Eden Emms, MD  Requesting Provider: Q. Imogene Burn, MD  Patient Profile    Peter Russo is a 41 y.o. male w/ a h/o CAD s/p multiple PCI's, HTN, HL, obesity, ICM, and tobacco abuse, who is being seen today for the evaluation of NSTEMI at the request of Dr. Laban Emperor.  Past Medical History   Past Medical History:  Diagnosis Date  . Anxiety   . CAD S/P percutaneous coronary angioplasty    a. s/p BMS-mLAD 2010. b. DES to RCA 2012. c. 04/2015: DES to LCx and PCI to LAD c/b dissection with subsequent overlapping DES to mLAD - r/i for NSTEMI afterwards; d. 08/2015 Ant STEMI in setting of noncompliance->134mLAD ISR (3.0x16 Synergy DES); e. 03/2016 PCI of apical LAD; f. 03/2016 Relook Cath: patent LAD stents, RCA 100p CTO-->Med Rx; g. 10/2016 Ant STEMI: PTCA 100p LAD.  Marland Kitchen Chronic combined systolic and diastolic CHF (congestive heart failure) (HCC)    a. 03/2016 Echo: EF 30-35%, Gr2 DD;  b. 08/2016 Echo: EF 40%.  . Depression   . Essential hypertension   . GAD (generalized anxiety disorder)   . GERD (gastroesophageal reflux disease)   . Hypercholesteremia   . Ischemic cardiomyopathy    a. prior EF 25-35 percent at cath, 35-40 percent by echo; b. 03/2016 Echo: EF 30-35%, diff HK, Gr2 DD; c. 08/2016 Echo: EF 40%, base/mid inferior/inferowseptal AK, mildly dil LA.  . Morbid obesity (HCC)   . Pre-diabetes   . SVT (supraventricular tachycardia) (HCC) APRROX 10 YRS AGO   PRESUMED AVNRT, ECHO 12/07 WITH EF 65%, MILD LAE  . Tobacco abuse    a. 20 + pack years, quit 10/2016.    Past Surgical History:  Procedure Laterality Date  . CARDIAC CATHETERIZATION N/A 04/23/2015   Procedure: Left Heart Cath and Coronary Angiography;  Surgeon: Wendall Stade, MD;  Location: [facility] INVASIVE CV LAB;  Service: Cardiovascular;   Laterality: N/A;  . CARDIAC CATHETERIZATION N/A 04/23/2015   Procedure: Coronary Stent Intervention;  Surgeon: Tonny Bollman, MD;  Location: [facility] INVASIVE CV LAB;  Service: Cardiovascular;  Laterality: N/A;  . CARDIAC CATHETERIZATION N/A 09/14/2015   Procedure: Left Heart Cath and Coronary Angiography;  Surgeon: Lyn Records, MD;  LAD 100%, CFX 10% ISR, RCA 100% (chronic), EF 25-35%  . CARDIAC CATHETERIZATION N/A 09/14/2015   Procedure: Coronary Stent Intervention;  Surgeon: Lyn Records, MD;  Location: [facility] INVASIVE CV LAB;  Service: Cardiovascular;  Laterality: N/A; 3.0 x 16 mm Synergy DES LAD  . CARDIAC CATHETERIZATION  03/16/2016  . CARDIAC CATHETERIZATION N/A 03/16/2016   Procedure: Left Heart Cath and Coronary Angiography;  Surgeon: Lyn Records, MD;  Location: [facility] INVASIVE CV LAB;  Service: Cardiovascular;  Laterality: N/A;  . CARDIAC CATHETERIZATION N/A 03/16/2016   Procedure: Coronary Balloon Angioplasty;  Surgeon: Lyn Records, MD;  Location: [facility] INVASIVE CV LAB;  Service: Cardiovascular;  Laterality: N/A;  . CARDIAC CATHETERIZATION N/A 03/25/2016   Procedure: Left Heart Cath and Coronary Angiography;  Surgeon: Peter M Swaziland, MD;  Location: [facility] INVASIVE CV LAB;  Service: Cardiovascular;  Laterality: N/A;  . CORONARY ANGIOPLASTY WITH STENT PLACEMENT  2010; 2013  . CORONARY BALLOON ANGIOPLASTY N/A 10/19/2016   Procedure: Coronary Balloon Angioplasty;  Surgeon: Corky Crafts, MD;  Location: [facility] INVASIVE CV LAB;  Service: Cardiovascular;  Laterality: N/A;  . INTRAVASCULAR ULTRASOUND/IVUS N/A 10/19/2016   Procedure: Intravascular Ultrasound/IVUS;  Surgeon: Corky Crafts, MD;  Location: [facility] INVASIVE CV LAB;  Service: Cardiovascular;  Laterality: N/A;  . LEFT HEART CATH AND CORONARY ANGIOGRAPHY N/A 10/19/2016   Procedure: Left Heart Cath and Coronary Angiography;  Surgeon: Corky Crafts, MD;  Location: [facility] INVASIVE CV LAB;  Service: Cardiovascular;  Laterality: N/A;     Allergies  No Known  Allergies  History of Present Illness    41 y/o ? with the above complex PMH including premature CAD with multiple PCI's involving all three major vessels, ICM (most recent EF 40%), HFrEF, HTN, HL, obesity, prediabetes, tob abuse, and noncompliance.  Though cardiac hx dates back to 2010, over the past year, he suffered an anterior STEMI in 08/2015 requiring DES  LAD (in setting of noncompliance), PCI of the apical LAD in 03/2016, and anterior STEMI in 10/2016 with an occluded proximal LAD (ISR) requiring balloon angioplasty.  He followed up in clinic a week later and was doing reasonably well.  He was readmitted to [facility] 3/30 with chest pain and had mild trop elevation to 0.30.  Med Rx was recommended and he was d/c'd on 4/1.    He says that since that hospitalization, he has continued to have intermittent chest discomfort, predominantly occurring @ rest.  When this occurs, he will take ntg w/ relief.  On 4/4, he had recurrent c/p lasting about 30 mins to an hour, and this eventually resolved after taking sl ntg x 2.  He fell asleep for a few hrs and when he awoke some time around dinner time, he felt well.  He ate dinner and then later in the evening while watching TV, he developed recurrent sscp w/o assoc Ss.  He took ntg x 3 w/o relief and then called EMS.  Following EMS arrival, O2 was placed and he had complete resolution of c/p.  Total duration was about 30 mins.  He was taken to the [facility] ED where ECG was non-acute.  Initial troponin was elevated @ 0.61.  This has since trended down @ 0.56 and 0.57.  He is c/p free this AM. He says that he has been compliant with all meds.  Inpatient Medications    . aspirin EC  81 mg Oral Daily  . atorvastatin  80 mg Oral q1800  . buPROPion  150 mg Oral BID  . carvedilol  12.5 mg Oral BID  . docusate sodium  100 mg Oral BID  . enoxaparin (LOVENOX) injection  40 mg Subcutaneous BID  . fenofibrate  54 mg Oral Daily  . isosorbide mononitrate  30 mg Oral Daily  .  losartan  25 mg Oral Daily  . ticagrelor  90 mg Oral BID    Family History    Family History  Problem Relation Age of Onset  . Arrhythmia Mother     MOTHER LIVED TO BE 102  . Coronary artery disease Mother   . Heart attack Mother   . Hypertension Mother   . Coronary artery disease Father 46  . Heart disease Father   . Heart attack Father   . Heart attack Brother   . Stroke Neg Hx     Social History    Social History   Social History  . Marital status: Single    Spouse name: N/A  . Number of children: N/A  . Years of education: N/A   Occupational History  . ACCOUNTANT [employer]  Social History Main Topics  . Smoking status: Former Smoker    Packs/day: 1.00    Years: 24.00    Quit date: 11/03/2016  . Smokeless tobacco: Never Used     Comment: quit on 03/15/16  . Alcohol use No  . Drug use: Yes    Types: Marijuana     Comment: last time 02/2016  . Sexual activity: Yes   Other Topics Concern  . Not on file   Social History Narrative   Lives in [place] by himself.  Sometimes stays with girlfriend in [place].  Does not routinely exercise.     Review of Systems    General:  No chills, fever, night sweats or weight changes.  Cardiovascular:  +++ chest pain, no dyspnea on exertion, edema, orthopnea, palpitations, paroxysmal nocturnal dyspnea. Dermatological: No rash, lesions/masses Respiratory: No cough, dyspnea Urologic: No hematuria, dysuria Abdominal:   No nausea, vomiting, diarrhea, bright red blood per rectum, melena, or hematemesis Neurologic:  No visual changes, wkns, changes in mental status. All other systems reviewed and are otherwise negative except as noted above.  Physical Exam    Blood pressure (!) 85/44, pulse 74, temperature 97.6 F (36.4 C), temperature source Oral, resp. rate 16, height  (1.702 m), weight 265 lb 4.8 oz (120.3 kg), SpO2 96 %.  General: Pleasant, NAD Psych: Normal affect. Neuro: Alert and oriented X 3. Moves all  extremities spontaneously. HEENT: Normal  Neck: Supple without bruits or JVD. Lungs:  Resp regular and unlabored, CTA. Heart: RRR no s3, s4, or murmurs. Abdomen: Soft, non-tender, non-distended, BS + x 4.  Extremities: No clubbing, cyanosis or edema. DP/PT/Radials 2+ and equal bilaterally.  Labs    Troponin [facility] Test)  Recent Labs  11/16/16 0341  TROPIPOC 0.05    Recent Labs  11/17/16 0115 11/17/16 0356 11/17/16 0951  TROPONINI 0.61* 0.56* 0.57*   Lab Results  Component Value Date   WBC 11.5 (H) 11/17/2016   HGB 14.0 11/17/2016   HCT 41.1 11/17/2016   MCV 85.9 11/17/2016   PLT 227 11/17/2016     Recent Labs Lab 11/17/16 0115  NA 137  K 3.6  CL 106  CO2 25  BUN 12  CREATININE 1.14  CALCIUM 9.2  GLUCOSE 187*   Lab Results  Component Value Date   CHOL 279 (H) 10/19/2016   HDL 33 (L) 10/19/2016   LDLCALC 183 (H) 10/19/2016   TRIG 313 (H) 10/19/2016    Radiology Studies    Dg Chest 2 View  Result Date: 11/17/2016 CLINICAL DATA:  41 y/o  M; chest pain. EXAM: CHEST  2 VIEW COMPARISON:  11/16/2016 chest radiograph. FINDINGS: Stable cardiac silhouette. Linear opacities in the left mid lung zone are stable and likely represent atelectasis or scarring. No focal consolidation. No pleural effusion or pneumothorax. Bones are unremarkable. IMPRESSION: No active cardiopulmonary disease. Electronically Signed   By: Mitzi Hansen M.D.   On: 11/17/2016 02:52   Dg Chest 2 View  Result Date: 11/16/2016 CLINICAL DATA:  Chest pain tonight, resolved with nitroglycerin. EXAM: CHEST  2 VIEW COMPARISON:  11/11/2016 FINDINGS: The lungs are clear. The pulmonary vasculature is normal. Unchanged borderline heart size. Hilar and mediastinal contours are unremarkable. There is no pleural effusion. IMPRESSION: No active cardiopulmonary disease. Electronically Signed   By: Ellery Plunk M.D.   On: 11/16/2016 03:58   Dg Chest 2 View  Result Date:  11/11/2016 CLINICAL DATA:  Acute onset of left-sided chest pain, radiating to the  left jaw. Shortness of breath. Initial encounter. EXAM: CHEST  2 VIEW COMPARISON:  Chest radiograph performed 03/24/2016 FINDINGS: The lungs are well-aerated. Vascular congestion is noted. Increased interstitial markings raise concern for pulmonary edema. There is no evidence of pleural effusion or pneumothorax. The heart is normal in size; the mediastinal contour is within normal limits. No acute osseous abnormalities are seen. IMPRESSION: Vascular congestion. Increased interstitial markings raise concern for pulmonary edema. Electronically Signed   By: Roanna Raider M.D.   On: 11/11/2016 21:57    ECG & Cardiac Imaging    RSR, 83, LAE, old anterolateral infarct.  Assessment & Plan    1.  Unstable Angina/Elevated troponin:  Pt with an extensive h/o CAD s/p multiple PCI's to all major vessels with known CTO of the RCA and recent anterior STEMI with PTCA of the proximal LAD 2/2 ISR in early March 2018.  He is now admitted for the third time in the past month with recurrent chest pain.  He was just discharged from [facility] on 4/1 after admission for c/p and mild trop elevation (peak 0.3).  He has continued to have intermittent c/p, predominantly @ rest and had two prolonged episodes on 4/4.  ECG was non-acute on arrival.  Trop was elevated @ 0.61 and has since trended down slightly (0.56  0.57).  He is c/p free this  AM.  Difficult situation given recent event w/ recurrent c/p and admissions.  Pt prefers more conservative approach, which is reasonable given lack of substantial troponin elevation.  Ambulate today.  If he remains c/p free, we will plan on a lexiscan MV in the am to r/o ischemia.  If +  cath.  If neg, plan d/c home.  Ideally, would like to titrate imdur to 60 mg, however soft bp's are prohibitive.  Don't really want to back down on  blocker or ARB given HFrEF.  Cont statin and brilinta.  Stressed the importance of  compliance.  2.  ICM/chronic HFrEF w/ diast failure as well:  euvolemic on exam.  HR/BP stable.  Cont  blocker and ARB.  No BP room to titrate or consider spiro/entresto.  3.  Essential HTN:  As above, BP has been soft.  Follow.  4.  HL/HTG:  LDL 183, TG 313, 10/19/2016 in setting of noncompliance w/ statin/fibrate.  He says that he is now taking both statin and fibrate.  Plan to f/u lipids/lft's in a few wks.  5.  Tob Abuse:  Says he quit 2 wks ago.  Encouraged him to remain off of cigarettes.  Signed, [name], NP 11/17/2016, 12:31 PM

## 2016-11-17 NOTE — ED Provider Notes (Signed)
[facility] Emergency Department Provider Note    First MD Initiated Contact with Patient 11/17/16 0117     (approximate)  I have reviewed the triage vital signs and the nursing notes.   HISTORY  Chief Complaint Chest Pain    HPI Peter Russo is a 41 y.o. male with history of 4 myocardial infarctions and 5 indwelling cardiac stents presents to the emergency department with chest pain since 10 AM yesterday morning. Patient states that the pain recurred tonight for which she is taking a total of 4 sublingual nitroglycerin with no pain at present. Patient denies any dyspnea no dizziness no diaphoresis nausea or vomiting. Patient describes the pain as located in the central chest and burning in nature.   Past Medical History:  Diagnosis Date  . Anxiety   . CAD S/P percutaneous coronary angioplasty    a. s/p BMS-mLAD 2010. b. DES to RCA 2012. c. 04/2015: DES to LCx and PCI to LAD c/b dissection with subsequent overlapping DES to mLAD - r/i for NSTEMI afterwards; d. 08/2015 Ant STEMI in setting of noncompliance->184mLAD ISR (DES); e. 03/2016 PCI of apical LAD; f. 03/2016 Relook Cath: patent LAD stents, D1 25, D2 45, LCX 10p/m, RCA 100p CTO-->Med Rx.  . Chronic combined systolic and diastolic CHF (congestive heart failure) (HCC)    a. 03/2016 Echo: EF 30-35%, Gr2 DD  . Depression   . Essential hypertension   . GAD (generalized anxiety disorder)   . GERD (gastroesophageal reflux disease)   . Hypercholesteremia   . Ischemic cardiomyopathy 09/14/2015   a. prior EF 25-35 percent at cath, 35-40 percent by echo; b. 03/2016 Echo: EF 30-35%, diff HK, Gr2 DD.  . Morbid obesity (HCC)   . Pre-diabetes   . ST elevation (STEMI) myocardial infarction involving left anterior descending coronary artery (HCC) 09/14/2015   3.0 x 16 mm Synergy DES to the LAD for ISR BMS  . STEMI involving left anterior descending coronary artery (HCC) 10/19/2016   balloon angioplasty to proximal  LAD, no PCI  . SVT (supraventricular tachycardia) (HCC) APRROX 10 YRS AGO   PRESUMED AVNRT, ECHO 12/07 WITH EF 65%, MILD LAE  . Tobacco abuse     Patient Active Problem List   Diagnosis Date Noted  . CAD (coronary artery disease) 11/12/2016  . AKI (acute kidney injury) (HCC) 11/11/2016  . History of acute anterior wall MI 10/19/2016  . Chronic combined systolic and diastolic CHF (congestive heart failure) (HCC)   . Ischemic cardiomyopathy   . H/O medication noncompliance   . Ischemic chest pain (HCC)   . Morbid obesity (HCC)   . Pre-diabetes   . Tobacco abuse   . CAD S/P percutaneous coronary angioplasty   . Essential hypertension   . Cellulitis of hand 05/27/2015  . 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 05/26/2015  . NSTEMI (non-ST elevated myocardial infarction) (HCC) 04/24/2015  . Depression 12/26/2014  . GAD (generalized anxiety disorder) 12/26/2014  . GERD (gastroesophageal reflux disease) 03/01/2013  . Dyslipidemia 03/14/2011    Past Surgical History:  Procedure Laterality Date  . CARDIAC CATHETERIZATION N/A 04/23/2015   Procedure: Left Heart Cath and Coronary Angiography;  Surgeon: Wendall Stade, MD;  Location: [facility] INVASIVE CV LAB;  Service: Cardiovascular;  Laterality: N/A;  . CARDIAC CATHETERIZATION N/A 04/23/2015   Procedure: Coronary Stent Intervention;  Surgeon: Tonny Bollman, MD;  Location: [facility] INVASIVE CV LAB;  Service: Cardiovascular;  Laterality: N/A;  . CARDIAC CATHETERIZATION N/A 09/14/2015  Procedure: Left Heart Cath and Coronary Angiography;  Surgeon: Lyn Records, MD;  LAD 100%, CFX 10% ISR, RCA 100% (chronic), EF 25-35%  . CARDIAC CATHETERIZATION N/A 09/14/2015   Procedure: Coronary Stent Intervention;  Surgeon: Lyn Records, MD;  Location: [facility] INVASIVE CV LAB;  Service: Cardiovascular;  Laterality: N/A; 3.0 x 16 mm Synergy DES LAD  . CARDIAC CATHETERIZATION  03/16/2016  . CARDIAC CATHETERIZATION N/A 03/16/2016   Procedure: Left Heart Cath and  Coronary Angiography;  Surgeon: Lyn Records, MD;  Location: [facility] INVASIVE CV LAB;  Service: Cardiovascular;  Laterality: N/A;  . CARDIAC CATHETERIZATION N/A 03/16/2016   Procedure: Coronary Balloon Angioplasty;  Surgeon: Lyn Records, MD;  Location: [facility] INVASIVE CV LAB;  Service: Cardiovascular;  Laterality: N/A;  . CARDIAC CATHETERIZATION N/A 03/25/2016   Procedure: Left Heart Cath and Coronary Angiography;  Surgeon: Peter M Swaziland, MD;  Location: [facility] INVASIVE CV LAB;  Service: Cardiovascular;  Laterality: N/A;  . CORONARY ANGIOPLASTY WITH STENT PLACEMENT  2010; 2013  . CORONARY BALLOON ANGIOPLASTY N/A 10/19/2016   Procedure: Coronary Balloon Angioplasty;  Surgeon: Corky Crafts, MD;  Location: [facility] INVASIVE CV LAB;  Service: Cardiovascular;  Laterality: N/A;  . INTRAVASCULAR ULTRASOUND/IVUS N/A 10/19/2016   Procedure: Intravascular Ultrasound/IVUS;  Surgeon: Corky Crafts, MD;  Location: [facility] INVASIVE CV LAB;  Service: Cardiovascular;  Laterality: N/A;  . LEFT HEART CATH AND CORONARY ANGIOGRAPHY N/A 10/19/2016   Procedure: Left Heart Cath and Coronary Angiography;  Surgeon: Corky Crafts, MD;  Location: [facility] INVASIVE CV LAB;  Service: Cardiovascular;  Laterality: N/A;    Prior to Admission medications   Medication Sig Start Date End Date Taking? Authorizing Provider  acetaminophen (TYLENOL) 325 MG tablet Take 2 tablets (650 mg total) by mouth every 4 (four) hours as needed for headache or mild pain. 11/13/16   Abelino Derrick, PA-C  aspirin 81 MG EC tablet Take 1 tablet (81 mg total) by mouth daily. 10/21/16   Marcelino Duster, PA  atorvastatin (LIPITOR) 80 MG tablet Take 1 tablet (80 mg total) by mouth daily at 6 PM. 11/01/16   Janetta Hora, PA-C  buPROPion [facility] SR) 150 MG 12 hr tablet Take 1 tablet (150 mg total) by mouth 2 (two) times daily. 11/01/16   Janetta Hora, PA-C  carvedilol (COREG) 12.5 MG tablet Take 1 tablet (12.5 mg total) by mouth 2 (two) times daily. 11/01/16  01/30/17  Janetta Hora, PA-C  fenofibrate (TRICOR) 48 MG tablet Take 1 tablet (48 mg total) by mouth daily. 11/01/16   Janetta Hora, PA-C  isosorbide mononitrate (IMDUR) 30 MG 24 hr tablet Take 1 tablet (30 mg total) by mouth daily. 11/01/16   Janetta Hora, PA-C  losartan (COZAAR) 25 MG tablet Take 1 tablet (25 mg total) by mouth daily. 11/01/16   Janetta Hora, PA-C  nitroGLYCERIN (NITROSTAT) 0.4 MG SL tablet Place 1 tablet (0.4 mg total) under the tongue every 5 (five) minutes as needed for chest pain (3 x). 10/21/16   Roe Rutherford Duke, PA  ticagrelor (BRILINTA) 90 MG TABS tablet Take 1 tablet (90 mg total) by mouth 2 (two) times daily. 10/21/16   Marcelino Duster, PA    Allergies Patient has no known allergies.  Family History  Problem Relation Age of Onset  . Arrhythmia Mother     MOTHER LIVED TO BE 102  . Coronary artery disease Mother   . Heart attack Mother   . Hypertension Mother   .  Coronary artery disease Father 64  . Heart disease Father   . Heart attack Father   . Heart attack Brother   . Stroke Neg Hx     Social History Social History  Substance Use Topics  . Smoking status: Former Smoker    Packs/day: 1.00    Years: 24.00    Types: Cigarettes  . Smokeless tobacco: Never Used     Comment: quit on 03/15/16  . Alcohol use No    Review of Systems Constitutional: No fever/chills Eyes: No visual changes. ENT: No sore throat. Cardiovascular:Positive for chest pain. Respiratory: Denies shortness of breath. Gastrointestinal: No abdominal pain.  No nausea, no vomiting.  No diarrhea.  No constipation. Genitourinary: Negative for dysuria. Musculoskeletal: Negative for back pain. Skin: Negative for rash. Neurological: Negative for headaches, focal weakness or numbness.  10-point ROS otherwise negative.  ____________________________________________   PHYSICAL EXAM:  VITAL SIGNS: ED Triage Vitals [11/17/16 0111]  Enc Vitals Group     BP  123/90     Pulse Rate 88     Resp 20     Temp 98.1 F (36.7 C)     Temp Source Oral     SpO2 99 %     Weight 270 lb (122.5 kg)     Height  (1.702 m)     Head Circumference      Peak Flow      Pain Score      Pain Loc      Pain Edu?      Excl. in GC?     Constitutional: Alert and oriented. Well appearing and in no acute distress. Eyes: Conjunctivae are normal. PERRL. EOMI. Head: Atraumatic. Mouth/Throat: Mucous membranes are moist.  Oropharynx non-erythematous. Neck: No stridor. Cardiovascular: Normal rate, regular rhythm. Good peripheral circulation. Grossly normal heart sounds. Respiratory: Normal respiratory effort.  No retractions. Lungs CTAB. Gastrointestinal: Soft and nontender. No distention.  Musculoskeletal: No lower extremity tenderness nor edema. No gross deformities of extremities. Neurologic:  Normal speech and language. No gross focal neurologic deficits are appreciated.  Skin:  Skin is warm, dry and intact. No rash noted. Psychiatric: Mood and affect are normal. Speech and behavior are normal.  ____________________________________________   LABS (all labs ordered are listed, but only abnormal results are displayed)  Labs Reviewed  CBC - Abnormal; Notable for the following:       Result Value   WBC 11.5 (*)    RDW 14.7 (*)    All other components within normal limits  BASIC METABOLIC PANEL  TROPONIN I   ____________________________________________  EKG  ED ECG REPORT I, [place] N Felis Quillin, the attending physician, personally viewed and interpreted this ECG.   Date: 11/18/2016  EKG Time: 1:11 AM  Rate: 83  Rhythm: Normal sinus rhythm  Axis: Normal  Intervals: Normal  ST&T Change: None  ____________________________________________  RADIOLOGY I,  N Tehilla Coffel, personally viewed and evaluated these images (plain radiographs) as part of my medical decision making, as well as reviewing the written report by the radiologist.  Dg Chest 2  View  Result Date: 11/16/2016 CLINICAL DATA:  Chest pain tonight, resolved with nitroglycerin. EXAM: CHEST  2 VIEW COMPARISON:  11/11/2016 FINDINGS: The lungs are clear. The pulmonary vasculature is normal. Unchanged borderline heart size. Hilar and mediastinal contours are unremarkable. There is no pleural effusion. IMPRESSION: No active cardiopulmonary disease. Electronically Signed   By: Ellery Plunk M.D.   On: 11/16/2016 03:58    ____________________________________________   PROCEDURES  Critical Care performed:     Procedures   ____________________________________________   INITIAL IMPRESSION / ASSESSMENT AND PLAN / ED COURSE  Pertinent labs & imaging results that were available during my care of the patient were reviewed by me and considered in my medical decision making (see chart for details).  41 year old male presenting with central chest discomfort relieved after 4 sublingual nitroglycerin. Patient troponin on 11/16/2016 was 0.05 which is normal however patient troponin today 0.61 raising concern for NSTEMI. Patient given aspirin Patient discussed with Dr. Sheryle Hail for hospital admission for further evaluation and management      ____________________________________________  FINAL CLINICAL IMPRESSION(S) / ED DIAGNOSES  Final diagnoses:  Chest pain  Nstemi   MEDICATIONS GIVEN DURING THIS VISIT:  Medications - No data to display   NEW OUTPATIENT MEDICATIONS STARTED DURING THIS VISIT:  New Prescriptions   No medications on file    Modified Medications   No medications on file    Discontinued Medications   No medications on file     Note:  This document was prepared using Dragon voice recognition software and may include unintentional dictation errors.    Darci Current, MD 11/18/16 [phone]

## 2016-11-17 NOTE — Telephone Encounter (Signed)
Initial appointment made with [facility] on 11/22/2016 at 10:20 AM

## 2016-11-17 NOTE — ED Notes (Signed)
Pt on cell phone.  nsr on monitor.  No chest pain.

## 2016-11-17 NOTE — ED Notes (Signed)
Pt brought in via ems from home with chest pain since this am.  Pt states he took 4 ntg today with some pain relief.  No chest pain on arrival to treatment room.  No sob.  No cough  No fever .  Pt alert.  Hx cardiac stents.

## 2016-11-17 NOTE — ED Notes (Signed)
Report off to dawn rn  

## 2016-11-17 NOTE — ED Notes (Signed)
No chest pain. Sinus at 73.  Pt sleepy.

## 2016-11-17 NOTE — Care Management (Signed)
Patient admitted with chest pain.  Stemi with 4 stents one month ago.  Informed during progression that stress test scheduled for 4/6.  Cardiology consult is pending

## 2016-11-17 NOTE — ED Notes (Signed)
Breathing treatment given to child by RT  Child fussy

## 2016-11-17 NOTE — Progress Notes (Signed)
This is a 41 year old male admitted for chest pain.  The patient has no more chest pain. Vital sign is stable. Physical examination is unremarkable.  A/P 1. Chest pain with elevated troponin.  Initial troponin 0.6, repeat troponin with little change 0.57, 0.56 Nitroglycerin as needed.  Per Dr. Mariah Milling, the patient prefers noninvasive imaging study such as Myoview tomorrow. If he does have recurrent symptoms, we could change the plan to cardiac catheterization tomorrow.  2. CAD: Status post PCI. Continue Brilinta and aspirin. Continue Imdur.  3. Hypertension: Blood pressure is still low side, continue Coreg and losartan, hold if SBP<110 or DBP<65.  4. Hyperlipidemia: Continue statin therapy as well as fibrate therapy 5. Depression: Continue Wellbutrin  I discussed with Dr. Mariah Milling, RN and case manager..  Time spent about 36 minutes.

## 2016-11-18 ENCOUNTER — Inpatient Hospital Stay (HOSPITAL_BASED_OUTPATIENT_CLINIC_OR_DEPARTMENT_OTHER): Payer: Medicare Other

## 2016-11-18 ENCOUNTER — Encounter: Payer: Self-pay | Admitting: Radiology

## 2016-11-18 DIAGNOSIS — R079 Chest pain, unspecified: Secondary | ICD-10-CM

## 2016-11-18 DIAGNOSIS — I208 Other forms of angina pectoris: Secondary | ICD-10-CM

## 2016-11-18 DIAGNOSIS — E782 Mixed hyperlipidemia: Secondary | ICD-10-CM

## 2016-11-18 DIAGNOSIS — R748 Abnormal levels of other serum enzymes: Secondary | ICD-10-CM

## 2016-11-18 DIAGNOSIS — R778 Other specified abnormalities of plasma proteins: Secondary | ICD-10-CM

## 2016-11-18 DIAGNOSIS — R7989 Other specified abnormal findings of blood chemistry: Secondary | ICD-10-CM

## 2016-11-18 LAB — NM MYOCAR MULTI W/SPECT W/WALL MOTION / EF
Estimated workload: 1 METS
Exercise duration (min): 0 min
Exercise duration (sec): 0 s
LV dias vol: 288 mL (ref 62–150)
LV sys vol: 201 mL
MPHR: 179 {beats}/min
Peak HR: 112 {beats}/min
Percent HR: 62 %
Rest HR: 64 {beats}/min
SDS: 6
SRS: 23
SSS: 16
TID: 1.15

## 2016-11-18 MED ORDER — TECHNETIUM TC 99M TETROFOSMIN IV KIT
30.0000 | PACK | Freq: Once | INTRAVENOUS | Status: AC | PRN
  Administered 2016-11-18: 30.11 via INTRAVENOUS

## 2016-11-18 MED ORDER — TECHNETIUM TC 99M TETROFOSMIN IV KIT
12.5100 | PACK | Freq: Once | INTRAVENOUS | Status: AC | PRN
  Administered 2016-11-18: 12.51 via INTRAVENOUS

## 2016-11-18 MED ORDER — REGADENOSON 0.4 MG/5ML IV SOLN
0.4000 mg | Freq: Once | INTRAVENOUS | Status: DC
  Filled 2016-11-18: qty 5

## 2016-11-18 NOTE — Progress Notes (Signed)
Patient discharged with friend to home.  Cholesterol prevention stressed with discharge instructions.  Dr. Mariah Milling did so also.  Medications and appointments reviewed with patient.

## 2016-11-18 NOTE — Progress Notes (Signed)
To nuc med for [facility]

## 2016-11-18 NOTE — Discharge Summary (Signed)
[employer] [facility] - [place] at [facility]   PATIENT NAME: Peter Russo    MR#:  [id number]  DATE OF BIRTH:  Jun 02, 1976  DATE OF ADMISSION:  11/17/2016   ADMITTING PHYSICIAN: Arnaldo Natal, MD  DATE OF DISCHARGE:  11/18/2016  PRIMARY CARE PHYSICIAN: Jaclyn Shaggy, MD   ADMISSION DIAGNOSIS:  chest pain DISCHARGE DIAGNOSIS:  Active Problems:   Atypical chest pain Unstable angina with elevated troponin.  SECONDARY DIAGNOSIS:   Past Medical History:  Diagnosis Date  . Anxiety   . CAD S/P percutaneous coronary angioplasty    a. s/p BMS-mLAD 2010. b. DES to RCA 2012. c. 04/2015: DES to LCx and PCI to LAD c/b dissection with subsequent overlapping DES to mLAD - r/i for NSTEMI afterwards; d. 08/2015 Ant STEMI in setting of noncompliance->129mLAD ISR (3.0x16 Synergy DES); e. 03/2016 PCI of apical LAD; f. 03/2016 Relook Cath: patent LAD stents, RCA 100p CTO-->Med Rx; g. 10/2016 Ant STEMI: PTCA 100p LAD.  Marland Kitchen Chronic combined systolic and diastolic CHF (congestive heart failure) (HCC)    a. 03/2016 Echo: EF 30-35%, Gr2 DD;  b. 08/2016 Echo: EF 40%.  . Depression   . Essential hypertension   . GAD (generalized anxiety disorder)   . GERD (gastroesophageal reflux disease)   . Hypercholesteremia   . Ischemic cardiomyopathy    a. prior EF 25-35 percent at cath, 35-40 percent by echo; b. 03/2016 Echo: EF 30-35%, diff HK, Gr2 DD; c. 08/2016 Echo: EF 40%, base/mid inferior/inferowseptal AK, mildly dil LA.  . Morbid obesity (HCC)   . Pre-diabetes   . SVT (supraventricular tachycardia) (HCC) APRROX 10 YRS AGO   PRESUMED AVNRT, ECHO 12/07 WITH EF 65%, MILD LAE  . Tobacco abuse    a. 20 + pack years, quit 10/2016.   HOSPITAL COURSE:  This is a 41 year old male admitted for chest pain.  The patient has no more chest pain since admission . 1. Unstable angina with elevated troponin.  Initial troponin 0.6, repeat troponin with little change 0.57, 0.56 Nitroglycerin as needed.  Per Dr.  Mariah Milling, the patient got Myoview with no significant  ischemia today.  2. CAD: Status post PCI. Continue Brilinta and aspirin. Continue Imdur.  3. Hypertension: Blood pressure is normal, continue Coreg and losartan, hold if SBP<110 or DBP<65.  4. Hyperlipidemia: Continue statin therapy as well as fibrate therapy 5. Depression: Continue Wellbutrin  DISCHARGE CONDITIONS:  Stable, discharge to home today. CONSULTS OBTAINED:  Treatment Team:  Antonieta Iba, MD DRUG ALLERGIES:  No Known Allergies DISCHARGE MEDICATIONS:   Allergies as of 11/18/2016   No Known Allergies     Medication List    TAKE these medications   acetaminophen 325 MG tablet Commonly known as:  TYLENOL Take 2 tablets (650 mg total) by mouth every 4 (four) hours as needed for headache or mild pain.   aspirin 81 MG EC tablet Take 1 tablet (81 mg total) by mouth daily.   atorvastatin 80 MG tablet Commonly known as:  LIPITOR Take 1 tablet (80 mg total) by mouth daily at 6 PM.   buPROPion 150 MG 12 hr tablet Commonly known as:  WELLBUTRIN SR Take 1 tablet (150 mg total) by mouth 2 (two) times daily.   carvedilol 12.5 MG tablet Commonly known as:  COREG Take 1 tablet (12.5 mg total) by mouth 2 (two) times daily.   fenofibrate 48 MG tablet Commonly known as:  TRICOR Take 1 tablet (48 mg total) by mouth daily.   isosorbide  mononitrate 30 MG 24 hr tablet Commonly known as:  IMDUR Take 1 tablet (30 mg total) by mouth daily.   losartan 25 MG tablet Commonly known as:  COZAAR Take 1 tablet (25 mg total) by mouth daily.   nitroGLYCERIN 0.4 MG SL tablet Commonly known as:  NITROSTAT Place 1 tablet (0.4 mg total) under the tongue every 5 (five) minutes as needed for chest pain (3 x).   ticagrelor 90 MG Tabs tablet Commonly known as:  BRILINTA Take 1 tablet (90 mg total) by mouth 2 (two) times daily.        DISCHARGE INSTRUCTIONS:  See AVS.  If you experience worsening of your admission symptoms,  develop shortness of breath, life threatening emergency, suicidal or homicidal thoughts you must seek medical attention immediately by calling 911 or calling your MD immediately  if symptoms less severe.  You Must read complete instructions/literature along with all the possible adverse reactions/side effects for all the Medicines you take and that have been prescribed to you. Take any new Medicines after you have completely understood and accpet all the possible adverse reactions/side effects.   Please note  You were cared for by a hospitalist during your hospital stay. If you have any questions about your discharge medications or the care you received while you were in the hospital after you are discharged, you can call the unit and asked to speak with the hospitalist on call if the hospitalist that took care of you is not available. Once you are discharged, your primary care physician will handle any further medical issues. Please note that NO REFILLS for any discharge medications will be authorized once you are discharged, as it is imperative that you return to your primary care physician (or establish a relationship with a primary care physician if you do not have one) for your aftercare needs so that they can reassess your need for medications and monitor your lab values.    On the day of Discharge:  VITAL SIGNS:  Blood pressure 120/82, pulse 81, temperature 98.4 F (36.9 C), temperature source Oral, resp. rate 16, height  (1.702 m), weight 268 lb (121.6 kg), SpO2 100 %. PHYSICAL EXAMINATION:  GENERAL:  41 y.o.-year-old patient lying in the bed with no acute distress.  EYES: Pupils equal, round, reactive to light and accommodation. No scleral icterus. Extraocular muscles intact.  HEENT: Head atraumatic, normocephalic. Oropharynx and nasopharynx clear.  NECK:  Supple, no jugular venous distention. No thyroid enlargement, no tenderness.  LUNGS: Normal breath sounds bilaterally, no  wheezing, rales,rhonchi or crepitation. No use of accessory muscles of respiration.  CARDIOVASCULAR: S1, S2 normal. No murmurs, rubs, or gallops.  ABDOMEN: Soft, non-tender, non-distended. Bowel sounds present. No organomegaly or mass.  EXTREMITIES: No pedal edema, cyanosis, or clubbing.  NEUROLOGIC: Cranial nerves II through XII are intact. Muscle strength 5/5 in all extremities. Sensation intact. Gait not checked.  PSYCHIATRIC: The patient is alert and oriented x 3.  SKIN: No obvious rash, lesion, or ulcer.  DATA REVIEW:   CBC  Recent Labs Lab 11/17/16 0115  WBC 11.5*  HGB 14.0  HCT 41.1  PLT 227    Chemistries   Recent Labs Lab 11/17/16 0115  NA 137  K 3.6  CL 106  CO2 25  GLUCOSE 187*  BUN 12  CREATININE 1.14  CALCIUM 9.2     Microbiology Results  Results for orders placed or performed during the hospital encounter of 10/19/16  MRSA PCR Screening  Status: None   Collection Time: 10/19/16  2:41 PM  Result Value Ref Range Status   MRSA by PCR NEGATIVE NEGATIVE Final    Comment:        The GeneXpert MRSA Assay (FDA approved for NASAL specimens only), is one component of a comprehensive MRSA colonization surveillance program. It is not intended to diagnose MRSA infection nor to guide or monitor treatment for MRSA infections.     RADIOLOGY:  Nm Myocar Multi W/spect W/wall Motion / Ef  Result Date: 11/18/2016 Pharmacological myocardial perfusion imaging study with no significant  ischemia Large region of fixed perfusion defect in the mid to distal anterior, septal, apical and periapical region, consistent with previous MI Hypokinesis of the mid to distal anterior, septal and apical region, EF estimated at 20% No EKG changes concerning for ischemia at peak stress or in recovery. Baseline EKG showing NSR with old anterior MI Moderate to high risk scan given severely depressed EF, size of prior MI. Signed, Dossie Arbour, MD, Ph.D [facility]     Management  plans discussed with the patient, family and they are in agreement.  CODE STATUS: Full Code   TOTAL TIME TAKING CARE OF THIS PATIENT: 26 minutes.    Shaune Pollack M.D on 11/18/2016 at 1:27 PM  Between 7am to 6pm - Pager - [phone]  After 6pm go to [email or web address] - Social research officer, government  [facility] [place] [facility]  Office  [phone]  CC: Primary care physician; Jaclyn Shaggy, MD   Note: This dictation was prepared with Dragon dictation along with smaller phrase technology. Any transcriptional errors that result from this process are unintentional.

## 2016-11-18 NOTE — Progress Notes (Addendum)
Progress Note  Patient Name: Peter Russo Date of Encounter: 11/18/2016  Primary Cardiologist: P. Eden Emms, MD   Subjective   No chest pain yesterday or overnight.  Trop trend remained flat.  For myoview this am to assess for high risk features.  Inpatient Medications    Scheduled Meds: . aspirin EC  81 mg Oral Daily  . atorvastatin  80 mg Oral q1800  . buPROPion  150 mg Oral BID  . carvedilol  12.5 mg Oral BID  . docusate sodium  100 mg Oral BID  . enoxaparin (LOVENOX) injection  40 mg Subcutaneous BID  . fenofibrate  54 mg Oral Daily  . isosorbide mononitrate  30 mg Oral Daily  . losartan  25 mg Oral Daily  . regadenoson  0.4 mg Intravenous Once  . ticagrelor  90 mg Oral BID   Continuous Infusions:  PRN Meds: acetaminophen **OR** acetaminophen, nitroGLYCERIN, ondansetron **OR** ondansetron (ZOFRAN) IV   Vital Signs    Vitals:   11/17/16 1004 11/17/16 1221 11/17/16 1948 11/18/16 0314  BP: 114/69 (!) 85/44 127/66 (!) 93/50  Pulse: 69 74 85 72  Resp: Temp: 97.6 F (36.4 C)  99.1 F (37.3 C) 97.7 F (36.5 C)  TempSrc: Oral  Oral Oral  SpO2: 100% 96% 100% 99%  Weight:    268 lb (121.6 kg)  Height:        Intake/Output Summary (Last 24 hours) at 11/18/16 0806 Last data filed at 11/18/16 0500  Gross per 24 hour  Intake              480 ml  Output              400 ml  Net               80 ml   Filed Weights   11/17/16 0111 11/17/16 0343 11/18/16 0314  Weight: 270 lb (122.5 kg) 265 lb 4.8 oz (120.3 kg) 268 lb (121.6 kg)    Physical Exam   GEN: Well nourished, well developed, in no acute distress.  HEENT: Grossly normal.  Neck: Supple, no JVD, carotid bruits, or masses. Cardiac: RRR, no murmurs, rubs, or gallops. No clubbing, cyanosis, edema.  Radials/DP/PT 2+ and equal bilaterally.  Respiratory:  Respirations regular and unlabored, clear to auscultation bilaterally. GI: Soft, nontender, nondistended, BS + x 4. MS: no deformity or  atrophy. Skin: warm and dry, no rash. Neuro:  Strength and sensation are intact. Psych: AAOx3.  Normal affect.  Labs    Chemistry Recent Labs Lab 11/12/16 0404 11/16/16 0326 11/17/16 0115  NA 140 137 137  K 3.5 3.3* 3.6  CL 106 101 106  CO2 GLUCOSE 140* 156* 187*  BUN CREATININE 1.19 1.27* 1.14  CALCIUM 8.7* 9.3 9.2  GFRNONAA >60 >60 >60  GFRAA >60 >60 >60  ANIONGAP Hematology Recent Labs Lab 11/13/16 0211 11/16/16 0326 11/17/16 0115  WBC 10.6* 10.7* 11.5*  RBC 4.37 4.57 4.78  HGB 12.6* 13.4 14.0  HCT 37.5* 38.8* 41.1  MCV 85.8 84.9 85.9  MCH 28.8 29.3 29.3  MCHC 33.6 34.5 34.1  RDW 14.2 13.8 14.7*  PLT 206 231 227    Cardiac Enzymes Recent Labs Lab 11/17/16 0115 11/17/16 0356 11/17/16 0951 11/17/16 1538  TROPONINI 0.61* 0.56* 0.57* 0.58*    Recent Labs Lab 11/11/16 2143 11/16/16 0341  TROPIPOC 0.03 0.05  BNP Recent Labs Lab 11/11/16 2127  BNP 190.0*     Radiology    Dg Chest 2 View  Result Date: 11/17/2016 CLINICAL DATA:  41 y/o  M; chest pain. EXAM: CHEST  2 VIEW COMPARISON:  11/16/2016 chest radiograph. FINDINGS: Stable cardiac silhouette. Linear opacities in the left mid lung zone are stable and likely represent atelectasis or scarring. No focal consolidation. No pleural effusion or pneumothorax. Bones are unremarkable. IMPRESSION: No active cardiopulmonary disease. Electronically Signed   By: Mitzi Hansen M.D.   On: 11/17/2016 02:52    Telemetry    RSR, ventricular couplet - Personally Reviewed  Cardiac Studies   Lexiscan myoview pending.  Patient Profile     41 y/o ? with a h/o premature CAD with multiple PCI's involving all three major vessels - most recently ant MI req PTCA of the prox LAD, ICM (most recent EF 40%), HFrEF, HTN, HL, obesity, prediabetes, tob abuse, and noncompliance, who was admitted 4/4 with recurrent c/p and mild troponin elevation.  Assessment & Plan    1.   USA/Elevated troponin/CAD:  Extensive CAD hx s/p multiple PCI's to all major vessels w/ known CTO of the RCA.  Recent anterior STEMI 2/2 ISR of LAD with PTCA of prox LAD performed.  Admitted to [facility] 3/30 with c/p and mild trop elevation  medically managed.  Recurrent c/p since with readmission to [facility] on the evening of 4/4.  Trop 0.61  0.56  0.57.  No c/p yesterday or overnight.  Will plan on myoview this am to risk stratify.  If low risk, would not pursue cath, however if high risk/+ for large area of ischemia, will need to pursue cath to re-eval LAD.  BP remains soft, thus titrating imdur not really an option.  Ranexa is a possibility though may be cost prohibitive.  Cont asa, statin, brilinta,  blocker, and ARB.  2.  ICM/Chronic HFrEF w/ diast failure:  EF 40%.  Volume stable.  HR/BP stable.  Cont  blocker and ARB.  No BP room for titration or initiation of spiro/entresto.  3.  Essential HTN:  BP soft/stable.  4.  HL/HTG:  LDL 183, TG 313 in March in setting of noncompliance.  Cont statin/fibrate.  5.  Tob Abuse:  Quit 2 wks ago.  Advised to remain off of cigarettes.  Signed, [name], NP  11/18/2016, 8:06 AM     Attending Note Patient seen and examined, agree with detailed note above,  Patient presentation and plan discussed on rounds.   Stress test This morning showing fixed defects Inferior wall and anterior wall. Otherwise no ischemia Severely depressed ejection fraction  Results discussed with the patient in detail Little room for changes to his medications Strongly recommended medication compliance. This has been an issue in the past. In particular strongly recommended he take his Lipitor 80 mg daily. Previous LDL 177. Discussed implications of this with him in detail. Unable to exclude small vessel disease as a cause of his problems Given medication noncompliance not a good candidate for ranexa  On physical exam lungs clear to auscultation, heart sounds regular, no  JVD, abdomen soft nontender, no significant lower extremity edema  ----Stable angina This is not a non-STEMI Non-trending troponin As above, recommended medication compliance No further testing needed, Nitroglycerin in the future for chest pain Elevated troponin non-trending from underlying severe diffuse coronary disease with no ischemia on stress test   Greater than 50% was spent in counseling and coordination of care with patient Total  encounter time 25 minutes or more   Signed: Dossie Arbour  M.D., Ph.D. [facility]

## 2016-11-18 NOTE — Discharge Instructions (Signed)
Heart healthy diet. °Smoking cessation. °

## 2016-11-22 ENCOUNTER — Ambulatory Visit: Payer: Medicaid Other | Admitting: Family

## 2016-11-24 ENCOUNTER — Encounter: Payer: Medicaid Other | Admitting: Cardiovascular Disease

## 2016-11-29 ENCOUNTER — Telehealth: Payer: Self-pay | Admitting: Family Medicine

## 2016-11-29 NOTE — Telephone Encounter (Signed)
[facility] Peter Russo call to report that the patient it look like is not take some medication, she want to inform the doctor of this an also she would like to speak with the nurse please call her @ [phone] The medication are  ticagrelor (BRILINTA) 90 MG TABS tablet buPROPion (WELLBUTRIN SR) 150 MG 12 hr tablet   Please follow up

## 2016-11-30 NOTE — Telephone Encounter (Signed)
Writer called Mrs Von back from [facility] L[facility] informing her that patient per Dr. Venetia Night should be taking both medications in question.  Patient also has 3 f/u appts that he needs to keep including his cardiology.

## 2016-11-30 NOTE — Telephone Encounter (Signed)
He needs to keep his upcoming appointment with cardiology so his medications can be reviewed with him. He needs to be on Brilinta and Wellbutrin.

## 2016-12-02 ENCOUNTER — Encounter: Payer: Self-pay | Admitting: Cardiovascular Disease

## 2016-12-02 NOTE — Progress Notes (Signed)
Cardiology Office Note    Date:  12/05/2016   ID:  Peter Russo, DOB Nov 29, 1975, MRN [id number]  PCP:  Jaclyn Shaggy, MD  Cardiologist:  Dr. Eden Emms / Dr. Graciela Husbands (EP)  CC: post hosp fu - STEMI   History of Present Illness:  Peter Russo is a 41 y.o. male with a history of CAD s/p multiple previous PCIs to LAD and LCx and known CTO to RCA, ICM, HTN, HLD, obesity, GERD and pre diabetes who presents to clinic for post hospital follow up after recent admission for STEMI.   He has a long hx of CAD with previous stenting of the LAD that was complicated by in stent restenosis with anterior STEMI in 1/07 requiring repeat intervention.He represented with chest pain in 03/16/2016 and underwent cath with intervention to the apical LAD. He then presented back 03/24/16 with chest pain and peak troponin of 3.38. Underwent cardiac cath showing patent stents with no new culprits for his reported symptoms. Echo at that time showed EF of 30-35% with diffuse hypokinesis and G2DD. Was maintained on ASA, statin, BB, ACEi, and Brilinta, with Imdur/spironolactone.   He was seen by Dr. Eden Emms 9/17 and reported doing well, along with being compliant with home medications. Sherryll Burger was added to his medications. He was seen by Dr. Graciela Husbands in consideration for possible ICD placement but repeat echo showed improved EF to 40%.   He was recently admitted to [facility] 3/7-10/21/16 for anterior STEMI. On arrival, he was taken emergently to the cath lab. Culprit lesion, proximal LAD, was 100% stenosed. Balloon angioplasty alone was performed on the proximal LAD; no stents were placed given questionable medication compliance. He was also noted to have CTO of the RCA, 10% prox-mid Cx and 45% 2nd diagonal. Decision was made to treat him medically. He was continued on ASA/Brilitna, lipitor, coreg, losartan and imdur. Per discharge summary Entresto and spironolactone were discontinued.   Feels overwhelmed with his own health issues and  family history. His father passed in his 19s and he feels like he is on the same path. Has medicaid now and willing to participate in cardiac rehab.    Past Medical History:  Diagnosis Date  . Anxiety   . CAD S/P percutaneous coronary angioplasty    a. s/p BMS-mLAD 2010. b. DES to RCA 2012. c. 04/2015: DES to LCx and PCI to LAD c/b dissection with subsequent overlapping DES to mLAD - r/i for NSTEMI afterwards; d. 08/2015 Ant STEMI in setting of noncompliance->160mLAD ISR (3.0x16 Synergy DES); e. 03/2016 PCI of apical LAD; f. 03/2016 Relook Cath: patent LAD stents, RCA 100p CTO-->Med Rx; g. 10/2016 Ant STEMI: PTCA 100p LAD.  Marland Kitchen Chronic combined systolic and diastolic CHF (congestive heart failure) (HCC)    a. 03/2016 Echo: EF 30-35%, Gr2 DD;  b. 08/2016 Echo: EF 40%.  . Depression   . Essential hypertension   . GAD (generalized anxiety disorder)   . GERD (gastroesophageal reflux disease)   . Hypercholesteremia   . Ischemic cardiomyopathy    a. prior EF 25-35 percent at cath, 35-40 percent by echo; b. 03/2016 Echo: EF 30-35%, diff HK, Gr2 DD; c. 08/2016 Echo: EF 40%, base/mid inferior/inferowseptal AK, mildly dil LA.  . Morbid obesity (HCC)   . Pre-diabetes   . SVT (supraventricular tachycardia) (HCC) APRROX 10 YRS AGO   PRESUMED AVNRT, ECHO 12/07 WITH EF 65%, MILD LAE  . Tobacco abuse    a. 20 + pack years, quit 10/2016.    Past Surgical  History:  Procedure Laterality Date  . CARDIAC CATHETERIZATION N/A 04/23/2015   Procedure: Left Heart Cath and Coronary Angiography;  Surgeon: Wendall Stade, MD;  Location: [facility] INVASIVE CV LAB;  Service: Cardiovascular;  Laterality: N/A;  . CARDIAC CATHETERIZATION N/A 04/23/2015   Procedure: Coronary Stent Intervention;  Surgeon: Tonny Bollman, MD;  Location: [facility] INVASIVE CV LAB;  Service: Cardiovascular;  Laterality: N/A;  . CARDIAC CATHETERIZATION N/A 09/14/2015   Procedure: Left Heart Cath and Coronary Angiography;  Surgeon: Lyn Records, MD;  LAD 100%, CFX 10%  ISR, RCA 100% (chronic), EF 25-35%  . CARDIAC CATHETERIZATION N/A 09/14/2015   Procedure: Coronary Stent Intervention;  Surgeon: Lyn Records, MD;  Location: [facility] INVASIVE CV LAB;  Service: Cardiovascular;  Laterality: N/A; 3.0 x 16 mm Synergy DES LAD  . CARDIAC CATHETERIZATION  03/16/2016  . CARDIAC CATHETERIZATION N/A 03/16/2016   Procedure: Left Heart Cath and Coronary Angiography;  Surgeon: Lyn Records, MD;  Location: [facility] INVASIVE CV LAB;  Service: Cardiovascular;  Laterality: N/A;  . CARDIAC CATHETERIZATION N/A 03/16/2016   Procedure: Coronary Balloon Angioplasty;  Surgeon: Lyn Records, MD;  Location: [facility] INVASIVE CV LAB;  Service: Cardiovascular;  Laterality: N/A;  . CARDIAC CATHETERIZATION N/A 03/25/2016   Procedure: Left Heart Cath and Coronary Angiography;  Surgeon: Bellanie Matthew M Swaziland, MD;  Location: [facility] INVASIVE CV LAB;  Service: Cardiovascular;  Laterality: N/A;  . CORONARY ANGIOPLASTY WITH STENT PLACEMENT  2010; 2013  . CORONARY BALLOON ANGIOPLASTY N/A 10/19/2016   Procedure: Coronary Balloon Angioplasty;  Surgeon: Corky Crafts, MD;  Location: [facility] INVASIVE CV LAB;  Service: Cardiovascular;  Laterality: N/A;  . INTRAVASCULAR ULTRASOUND/IVUS N/A 10/19/2016   Procedure: Intravascular Ultrasound/IVUS;  Surgeon: Corky Crafts, MD;  Location: [facility] INVASIVE CV LAB;  Service: Cardiovascular;  Laterality: N/A;  . LEFT HEART CATH AND CORONARY ANGIOGRAPHY N/A 10/19/2016   Procedure: Left Heart Cath and Coronary Angiography;  Surgeon: Corky Crafts, MD;  Location: [facility] INVASIVE CV LAB;  Service: Cardiovascular;  Laterality: N/A;    Current Medications: Outpatient Medications Prior to Visit  Medication Sig Dispense Refill  . acetaminophen (TYLENOL) 325 MG tablet Take 2 tablets (650 mg total) by mouth every 4 (four) hours as needed for headache or mild pain.    Marland Kitchen aspirin 81 MG EC tablet Take 1 tablet (81 mg total) by mouth daily. 90 tablet 3  . atorvastatin (LIPITOR) 80 MG tablet Take 1 tablet  (80 mg total) by mouth daily at 6 PM. 90 tablet 3  . buPROPion (WELLBUTRIN SR) 150 MG 12 hr tablet Take 1 tablet (150 mg total) by mouth 2 (two) times daily. 60 tablet 11  . carvedilol (COREG) 12.5 MG tablet Take 1 tablet (12.5 mg total) by mouth 2 (two) times daily. 60 tablet 6  . fenofibrate (TRICOR) 48 MG tablet Take 1 tablet (48 mg total) by mouth daily. 30 tablet 11  . isosorbide mononitrate (IMDUR) 30 MG 24 hr tablet Take 1 tablet (30 mg total) by mouth daily. 30 tablet 6  . losartan (COZAAR) 25 MG tablet Take 1 tablet (25 mg total) by mouth daily. 30 tablet 11  . nitroGLYCERIN (NITROSTAT) 0.4 MG SL tablet Place 1 tablet (0.4 mg total) under the tongue every 5 (five) minutes as needed for chest pain (3 x). 25 tablet 2  . ticagrelor (BRILINTA) 90 MG TABS tablet Take 1 tablet (90 mg total) by mouth 2 (two) times daily. 180 tablet 3   No facility-administered medications prior to visit.  Allergies:   Patient has no known allergies.   Social History   Social History  . Marital status: Single    Spouse name: N/A  . Number of children: N/A  . 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 Smoking status: Current Some Day Smoker    Packs/day: 1.00    Years: 24.00    Last attempt to quit: 11/03/2016  . Smokeless tobacco: Never Used     Comment: quit on 03/15/16  . Alcohol use No  . Drug use: Yes    Types: Marijuana     Comment: last time 02/2016  . Sexual activity: Yes   Other Topics Concern  . None   Social History Narrative   Lives in [place] by himself.  Sometimes stays with girlfriend in [place].  Does not routinely exercise.     Family History:  The patient's family history includes Arrhythmia in his mother; Coronary artery disease in his mother; Coronary artery disease (age of onset: 11) in his father; Heart attack in his brother, father, and mother; Heart disease in his father; Hypertension in his mother.        ROS:   Please see the history of present illness.    ROS All other systems reviewed and are negative.   PHYSICAL EXAM:   VS:  BP 110/80   Pulse 80   Ht  (1.702 m)   Wt 276 lb 1.9 oz (125.2 kg)   SpO2 97%   BMI 43.25 kg/m     Affect appropriate Chronically ill black male  HEENT: normal Neck supple with no adenopathy JVP normal no bruits no thyromegaly Lungs clear with no wheezing and good diaphragmatic motion Heart:  S1/S2 no murmur, no rub, gallop or click PMI normal Abdomen: benighn, BS positve, no tenderness, no AAA no bruit.  No HSM or HJR Distal pulses intact with no bruits No edema Neuro non-focal Skin warm and dry No muscular weakness    Wt Readings from Last 3 Encounters:  12/05/16 276 lb 1.9 oz (125.2 kg)  11/18/16 268 lb (121.6 kg)  11/12/16 274 lb 8 oz (124.5 kg)      Studies/Labs Reviewed:   EKG:  EKG is NOT ordered today.  Recent Labs: 03/17/2016: Magnesium 1.8 10/19/2016: ALT 38 11/11/2016: B Natriuretic Peptide 190.0 11/17/2016: BUN 12; Creatinine, Ser 1.14; Hemoglobin 14.0; Platelets 227; Potassium 3.6; Sodium 137; TSH 1.920   Lipid Panel    Component Value Date/Time   CHOL 279 (H) 10/19/2016 1312   TRIG 313 (H) 10/19/2016 1312   HDL 33 (L) 10/19/2016 1312   CHOLHDL 8.5 10/19/2016 1312   VLDL 63 (H) 10/19/2016 1312   LDLCALC 183 (H) 10/19/2016 1312    Additional studies/ records that were reviewed today include:  2D Echocardiogram8.11.2017 Study Conclusions - Left ventricle: The cavity size was normal. Wall thickness was increased in a pattern of mild LVH. Systolic function was moderately to severely reduced. The estimated ejection fraction was in the range of 30% to 35%. Diffuse hypokinesis. There is akinesis of the apical myocardium. Features are consistent with a pseudonormal left ventricular filling pattern, with concomitant abnormal relaxation and increased filling pressure (grade 2 diastolic  dysfunction). Impressions: - Technically difficult; definity used; diffuse hypokinesis with apical akinesis; overall moderate to severe LV dysfunction; grade 2 diastolic dysfunction; indeterminant LV filling pressure. _____________  Cardiac Catheterization8.11.2017  Coronary Findings  Dominance: Right  Left Anterior Descending  Prox LAD lesion, 0% stenosed. Prox LAD lesion with no stenosis was previously treated.  Prox LAD to Dist LAD lesion, 0% stenosed. Prox LAD to Dist LAD lesion with no stenosis was previously treated.  Dist LAD lesion, 25% stenosed.  First Diagonal Branch  Vessel is small in size.  Second Diagonal Branch  2nd Diag lesion, 45% stenosed. The lesion is dissected.  Left Circumflex  Prox Cx to Mid Cx lesion, 10% stenosed. The lesion was previously treated.  Third Obtuse Marginal Branch  Vessel is small in size.  Right Coronary Artery  Dist RCA filled by collaterals from Prox RCA.  Prox RCA lesion, 100% stenosed.   Coronary Diagrams     _____________  10/19/16 Coronary Balloon Angioplasty  Intravascular Ultrasound/IVUS  Left Heart Cath and Coronary Angiography  Conclusion     Prox Cx to Mid Cx lesion, 10 %stenosed.  Prox RCA lesion, 100 %stenosed. Chronic total occlusion.  2nd Diag lesion, 45 %stenosed.  Prox LAD lesion, 100 %stenosed. This was the culprit lesion and treated with thrombectomy and angioplasty, > 4 mm. IVUS showed the vessel diameter at approximaltely 4 mm.  Post intervention, there is a 0% residual stenosis.  There is no aortic valve stenosis.  LV end diastolic pressure is severely elevated.   He needs aggressive risk factor modification. He needs to be compliant with dual antiplatelet therapy. We elected not to place any additional stents since there were questions about compliance. There was inadequate result with just balloon dilatation with intravascular ultrasound guidance. He states he was taking his Brilinta. In the  event that he had missed some doses, we gave a dose of intravenous tirofiban. He was again loaded with oral Brilinta. He'll need aggressive medical therapy for his systolic dysfunction. Elevated LVEDP. Hold off on IV fluids at this point.  He will be watched in the CCU.     ASSESSMENT & PLAN:   CAD: with recent anterior STEMI. Emergent cath showed 100% prox LAD occlusion s/p thrombectomy and PCTA. Dr. Eldridge Dace elected not to place any additional stents since there were questions about compliance. He was continued on ASA/Brilnta, atorvastatin  daily, Coreg 12.5mg  BID and imdur  daily.   Ischemic CM: EF 40 % in 08/2016 and ICD placement deferred.    HTN: Well controlled.  Continue current medications and low sodium Dash type diet.    HLD: LDL 183 on most recent lipid panel. Compliance with statin also and issue  Obesity: Body mass index is 43.25 kg/m. Weight loss recommended  Pre diabetes: Hg A1c 6.1. Counseled on importance of diet and exercise.   Tobacco abuse: he still smokes a few times a week when he gets stressed.   Depression: on Welbutrin f/u primary      [employer]

## 2016-12-05 ENCOUNTER — Ambulatory Visit (INDEPENDENT_AMBULATORY_CARE_PROVIDER_SITE_OTHER): Payer: Medicaid Other | Admitting: Cardiovascular Disease

## 2016-12-05 ENCOUNTER — Encounter (INDEPENDENT_AMBULATORY_CARE_PROVIDER_SITE_OTHER): Payer: Self-pay

## 2016-12-05 ENCOUNTER — Encounter: Payer: Self-pay | Admitting: Cardiovascular Disease

## 2016-12-05 VITALS — BP 110/80 | HR 80 | Ht 67.0 in | Wt 276.1 lb

## 2016-12-05 DIAGNOSIS — Z8679 Personal history of other diseases of the circulatory system: Secondary | ICD-10-CM | POA: Diagnosis not present

## 2016-12-05 NOTE — Patient Instructions (Signed)

## 2016-12-14 ENCOUNTER — Telehealth (HOSPITAL_COMMUNITY): Payer: Self-pay | Admitting: Family Medicine

## 2016-12-14 NOTE — Telephone Encounter (Signed)
Update: Verification on Medicaid insurance benefits through [employer]port [employer] [phone]...Marland KitchenMarland KitchenMarland Kitchen KJ

## 2016-12-15 ENCOUNTER — Inpatient Hospital Stay (HOSPITAL_COMMUNITY): Admission: RE | Admit: 2016-12-15 | Payer: Medicaid Other | Source: Ambulatory Visit

## 2016-12-15 ENCOUNTER — Telehealth (HOSPITAL_COMMUNITY): Payer: Self-pay | Admitting: Family Medicine

## 2016-12-15 NOTE — Telephone Encounter (Signed)
I called and left message on patient voicemail to call office to reschedule orientation and cardiac rehab class. I left my contact information on patient voicemail to return call.  °

## 2016-12-19 ENCOUNTER — Ambulatory Visit (HOSPITAL_COMMUNITY): Payer: Medicaid Other

## 2016-12-21 ENCOUNTER — Ambulatory Visit (HOSPITAL_COMMUNITY): Payer: Medicaid Other

## 2016-12-23 ENCOUNTER — Ambulatory Visit (HOSPITAL_COMMUNITY): Payer: Medicaid Other

## 2016-12-26 ENCOUNTER — Ambulatory Visit (HOSPITAL_COMMUNITY): Payer: Medicaid Other

## 2016-12-28 ENCOUNTER — Ambulatory Visit (HOSPITAL_COMMUNITY): Payer: Medicaid Other

## 2016-12-28 ENCOUNTER — Encounter (HOSPITAL_COMMUNITY): Payer: Self-pay | Admitting: *Deleted

## 2016-12-28 ENCOUNTER — Ambulatory Visit (HOSPITAL_COMMUNITY)
Admission: EM | Admit: 2016-12-28 | Discharge: 2016-12-28 | Disposition: A | Discharge status: Home / Self Care | Payer: Medicare Other | Attending: Family Medicine | Admitting: Family Medicine

## 2016-12-28 ENCOUNTER — Telehealth (HOSPITAL_COMMUNITY): Payer: Self-pay

## 2016-12-28 DIAGNOSIS — K047 Periapical abscess without sinus: Secondary | ICD-10-CM | POA: Diagnosis not present

## 2016-12-28 DIAGNOSIS — K0889 Other specified disorders of teeth and supporting structures: Secondary | ICD-10-CM | POA: Diagnosis not present

## 2016-12-28 MED ORDER — IBUPROFEN 600 MG PO TABS: 600.0000 mg | ORAL_TABLET | Freq: Four times a day (QID) | ORAL | 0 refills | Status: DC | PRN

## 2016-12-28 MED ORDER — AMOXICILLIN 500 MG PO CAPS: 1000.0000 mg | ORAL_CAPSULE | Freq: Two times a day (BID) | ORAL | 0 refills | Status: DC

## 2016-12-28 MED ORDER — HYDROCODONE-ACETAMINOPHEN 5-325 MG PO TABS: 1.0000 | ORAL_TABLET | ORAL | 0 refills | Status: DC | PRN

## 2016-12-28 NOTE — Telephone Encounter (Signed)
I called and left message on patient voicemail to call office to reschedule orientation and cardiac rehab class. I left my contact information on patient voicemail to return call.

## 2016-12-28 NOTE — Discharge Instructions (Signed)
Take medications as directed. Use a fluoride dental rinse daily 2-3 times. Call your dentist tomorrow for an appointment to be set up and sent is possible. Make you take all of your antibiotic as directed.

## 2016-12-28 NOTE — ED Triage Notes (Signed)
Pt reports     Pain  r  Lower    Side    Of    Mouth        Started  Yesterday

## 2016-12-28 NOTE — ED Provider Notes (Signed)
CSN: [id number]     Arrival date & time 12/28/16  1932 History   First MD Initiated Contact with Patient 12/28/16 2014     Chief Complaint  Patient presents with  . Dental Problem   (Consider location/radiation/quality/duration/timing/severity/associated sxs/prior Treatment) 41 year old male presents to the urgent care tonight with toothache on the right lower third molar. Started yesterday. He states it hurts bad but he has not taken any medicine for it. He points to the right lower third molar. He also states that his gums are sore.      Past Medical History:  Diagnosis Date  . Anxiety   . CAD S/P percutaneous coronary angioplasty    a. s/p BMS-mLAD 2010. b. DES to RCA 2012. c. 04/2015: DES to LCx and PCI to LAD c/b dissection with subsequent overlapping DES to mLAD - r/i for NSTEMI afterwards; d. 08/2015 Ant STEMI in setting of noncompliance->145mLAD ISR (3.0x16 Synergy DES); e. 03/2016 PCI of apical LAD; f. 03/2016 Relook Cath: patent LAD stents, RCA 100p CTO-->Med Rx; g. 10/2016 Ant STEMI: PTCA 100p LAD.  Marland Kitchen Chronic combined systolic and diastolic CHF (congestive heart failure) (HCC)    a. 03/2016 Echo: EF 30-35%, Gr2 DD;  b. 08/2016 Echo: EF 40%.  . Depression   . Essential hypertension   . GAD (generalized anxiety disorder)   . GERD (gastroesophageal reflux disease)   . Hypercholesteremia   . Ischemic cardiomyopathy    a. prior EF 25-35 percent at cath, 35-40 percent by echo; b. 03/2016 Echo: EF 30-35%, diff HK, Gr2 DD; c. 08/2016 Echo: EF 40%, base/mid inferior/inferowseptal AK, mildly dil LA.  . Morbid obesity (HCC)   . Pre-diabetes   . SVT (supraventricular tachycardia) (HCC) APRROX 10 YRS AGO   PRESUMED AVNRT, ECHO 12/07 WITH EF 65%, MILD LAE  . Tobacco abuse    a. 20 + pack years, quit 10/2016.   Past Surgical History:  Procedure Laterality Date  . CARDIAC CATHETERIZATION N/A 04/23/2015   Procedure: Left Heart Cath and Coronary Angiography;  Surgeon: Wendall Stade, MD;   Location: [facility] INVASIVE CV LAB;  Service: Cardiovascular;  Laterality: N/A;  . CARDIAC CATHETERIZATION N/A 04/23/2015   Procedure: Coronary Stent Intervention;  Surgeon: Tonny Bollman, MD;  Location: [facility] INVASIVE CV LAB;  Service: Cardiovascular;  Laterality: N/A;  . CARDIAC CATHETERIZATION N/A 09/14/2015   Procedure: Left Heart Cath and Coronary Angiography;  Surgeon: Lyn Records, MD;  LAD 100%, CFX 10% ISR, RCA 100% (chronic), EF 25-35%  . CARDIAC CATHETERIZATION N/A 09/14/2015   Procedure: Coronary Stent Intervention;  Surgeon: Lyn Records, MD;  Location: [facility] INVASIVE CV LAB;  Service: Cardiovascular;  Laterality: N/A; 3.0 x 16 mm Synergy DES LAD  . CARDIAC CATHETERIZATION  03/16/2016  . CARDIAC CATHETERIZATION N/A 03/16/2016   Procedure: Left Heart Cath and Coronary Angiography;  Surgeon: Lyn Records, MD;  Location: [facility] INVASIVE CV LAB;  Service: Cardiovascular;  Laterality: N/A;  . CARDIAC CATHETERIZATION N/A 03/16/2016   Procedure: Coronary Balloon Angioplasty;  Surgeon: Lyn Records, MD;  Location: [facility] INVASIVE CV LAB;  Service: Cardiovascular;  Laterality: N/A;  . CARDIAC CATHETERIZATION N/A 03/25/2016   Procedure: Left Heart Cath and Coronary Angiography;  Surgeon: Peter M Swaziland, MD;  Location: [facility] INVASIVE CV LAB;  Service: Cardiovascular;  Laterality: N/A;  . CORONARY ANGIOPLASTY WITH STENT PLACEMENT  2010; 2013  . CORONARY BALLOON ANGIOPLASTY N/A 10/19/2016   Procedure: Coronary Balloon Angioplasty;  Surgeon: Corky Crafts, MD;  Location: [facility] INVASIVE CV LAB;  Service: Cardiovascular;  Laterality: N/A;  . INTRAVASCULAR ULTRASOUND/IVUS N/A 10/19/2016   Procedure: Intravascular Ultrasound/IVUS;  Surgeon: Corky CraftsJayadeep S Varanasi, MD;  Location: [facility] INVASIVE CV LAB;  Service: Cardiovascular;  Laterality: N/A;  . LEFT HEART CATH AND CORONARY ANGIOGRAPHY N/A 10/19/2016   Procedure: Left Heart Cath and Coronary Angiography;  Surgeon: Corky CraftsJayadeep S Varanasi, MD;  [facility];  Service:  Cardiovascular;  Laterality: N/A;   Family History  Problem Relation Age of Onset  . Arrhythmia Mother        MOTHER LIVED TO BE 89  . Coronary artery disease Mother   . Heart attack Mother   . Hypertension Mother   . Coronary artery disease Father [phone]  . Heart disease Father   . Heart attack Father   . Heart attack Brother   . Stroke Neg Hx    Social History  Substance Use Topics  . Smoking status: Current Some Day Smoker    Packs/day: 1.00    Years: 24.00    Last attempt to quit: 11/03/2016  . Smokeless tobacco: Never Used     Comment: quit on 03/15/16  . Alcohol use No    Review of Systems  Constitutional: Negative.   HENT: Positive for dental problem.   All other systems reviewed and are negative.   Allergies  Patient has no known allergies.  Home Medications   Prior to Admission medications   Medication Sig Start Date End Date Taking? Authorizing Provider  acetaminophen (TYLENOL) 325 MG tablet Take 2 tablets (650 mg total) by mouth every 4 (four) hours as needed for headache or mild pain. 11/13/16   Abelino DerrickKilroy, Luke K, PA-C  amoxicillin (AMOXIL) 500 MG capsule Take 2 capsules (1,000 mg total) by mouth 2 (two) times daily. 12/28/16   Hayden RasmussenMabe, Moneka Mcquinn, NP  aspirin 81 MG EC tablet Take 1 tablet (81 mg total) by mouth daily. 10/21/16   [facility]uke, Roe RutherfordAngela Nicole, PA  atorvastatin (LIPITOR) 80 MG tablet Take 1 tablet (80 mg total) by mouth daily at 6 PM. 11/01/16   Janetta Horahompson, Kathryn R, PA-C  buPROPion [facility](WELLBUTRIN SR) 150 MG 12 hr tablet Take 1 tablet (150 mg total) by mouth 2 (two) times daily. 11/01/16   Janetta Horahompson, Kathryn R, PA-C  carvedilol (COREG) 12.5 MG tablet Take 1 tablet (12.5 mg total) by mouth 2 (two) times daily. 11/01/16 01/30/17  Janetta Horahompson, Kathryn R, PA-C  fenofibrate (TRICOR) 48 MG tablet Take 1 tablet (48 mg total) by mouth daily. 11/01/16   Janetta Horahompson, Kathryn R, PA-C  HYDROcodone-acetaminophen (NORCO/VICODIN) 5-325 MG tablet Take 1 tablet by mouth every 4 (four) hours as needed.  12/28/16   Hayden RasmussenMabe, Yony Roulston, NP  ibuprofen (ADVIL,MOTRIN) 600 MG tablet Take 1 tablet (600 mg total) by mouth every 6 (six) hours as needed. 12/28/16   Hayden RasmussenMabe, Orla Estrin, NP  isosorbide mononitrate (IMDUR) 30 MG 24 hr tablet Take 1 tablet (30 mg total) by mouth daily. 11/01/16   Janetta Horahompson, Kathryn R, PA-C  losartan (COZAAR) 25 MG tablet Take 1 tablet (25 mg total) by mouth daily. 11/01/16   Janetta Horahompson, Kathryn R, PA-C  nitroGLYCERIN (NITROSTAT) 0.4 MG SL tablet Place 1 tablet (0.4 mg total) under the tongue every 5 (five) minutes as needed for chest pain (3 x). 10/21/16   [facility], Roe RutherfordAngela Nicole, PA  ticagrelor (BRILINTA) 90 MG TABS tablet Take 1 tablet (90 mg total) by mouth 2 (two) times daily. 10/21/16   Duke, Roe RutherfordAngela Nicole, PA   Meds Ordered and Administered this Visit  Medications -  No data to display  BP (!) 148/84 (BP Location: Right Arm)   Pulse 78   Temp 98.6 F (37 C) (Oral)   Resp 18   SpO2 99%  No data found.   Physical Exam  Constitutional: He is oriented to person, place, and time. He appears well-developed and well-nourished.  HENT:  Head: Normocephalic and atraumatic.  Mouth/Throat: Oropharynx is clear and moist.  Right lower first second and third molars have dental tenderness with the third being the greatest. There is mild swelling of the buccal mucosa adjacent to the molars. Scattered gingivitis. No purulence seen.  Neck: Normal range of motion. Neck supple.  Cardiovascular: Normal rate.   Lymphadenopathy:    He has no cervical adenopathy.  Neurological: He is alert and oriented to person, place, and time.  Skin: Skin is warm and dry.  Nursing note and vitals reviewed.   Urgent Care Course     Procedures (including critical care time)  Labs Review Labs Reviewed - No data to display  Imaging Review No results found.   Visual Acuity Review  Right Eye Distance:   Left Eye Distance:   Bilateral Distance:    Right Eye Near:   Left Eye Near:    Bilateral Near:          MDM   1. Toothache   2. Dental infection    Take medications as directed. Use a fluoride dental rinse daily 2-3 times. Call your dentist tomorrow for an appointment to be set up and sent is possible. Make sure you take all of your antibiotic as directed. Meds ordered this encounter  Medications  . amoxicillin (AMOXIL) 500 MG capsule    Sig: Take 2 capsules (1,000 mg total) by mouth 2 (two) times daily.    Dispense:  30 capsule    Refill:  0    Order Specific Question:   Supervising Provider    Answer:   Elvina Sidle [[id number]]  . ibuprofen (ADVIL,MOTRIN) 600 MG tablet    Sig: Take 1 tablet (600 mg total) by mouth every 6 (six) hours as needed.    Dispense:  15 tablet    Refill:  0    Order Specific Question:   Supervising Provider    Answer:   Elvina Sidle [[id number]]  . HYDROcodone-acetaminophen (NORCO/VICODIN) 5-325 MG tablet    Sig: Take 1 tablet by mouth every 4 (four) hours as needed.    Dispense:  15 tablet    Refill:  0    Order Specific Question:   Supervising Provider    Answer:   Maudry Diego, NP 12/28/16 2023

## 2016-12-30 ENCOUNTER — Ambulatory Visit (HOSPITAL_COMMUNITY): Payer: Medicaid Other

## 2017-01-02 ENCOUNTER — Ambulatory Visit (HOSPITAL_COMMUNITY): Payer: Medicaid Other

## 2017-01-04 ENCOUNTER — Ambulatory Visit (HOSPITAL_COMMUNITY): Payer: Medicaid Other

## 2017-01-06 ENCOUNTER — Ambulatory Visit (HOSPITAL_COMMUNITY): Payer: Medicaid Other

## 2017-01-11 ENCOUNTER — Ambulatory Visit (HOSPITAL_COMMUNITY): Payer: Medicaid Other

## 2017-01-13 ENCOUNTER — Ambulatory Visit (HOSPITAL_COMMUNITY): Payer: Medicaid Other

## 2017-01-16 ENCOUNTER — Ambulatory Visit (HOSPITAL_COMMUNITY): Payer: Medicaid Other

## 2017-01-18 ENCOUNTER — Ambulatory Visit (HOSPITAL_COMMUNITY): Payer: Medicaid Other

## 2017-01-20 ENCOUNTER — Ambulatory Visit (HOSPITAL_COMMUNITY): Payer: Medicaid Other

## 2017-01-23 ENCOUNTER — Ambulatory Visit (HOSPITAL_COMMUNITY): Payer: Medicaid Other

## 2017-01-25 ENCOUNTER — Ambulatory Visit (HOSPITAL_COMMUNITY): Payer: Medicaid Other

## 2017-01-27 ENCOUNTER — Ambulatory Visit (HOSPITAL_COMMUNITY): Payer: Medicaid Other

## 2017-01-30 ENCOUNTER — Ambulatory Visit (HOSPITAL_COMMUNITY): Payer: Medicaid Other

## 2017-02-01 ENCOUNTER — Ambulatory Visit (HOSPITAL_COMMUNITY): Payer: Medicaid Other

## 2017-02-03 ENCOUNTER — Ambulatory Visit (HOSPITAL_COMMUNITY): Payer: Medicaid Other

## 2017-02-06 ENCOUNTER — Ambulatory Visit (HOSPITAL_COMMUNITY): Payer: Medicaid Other

## 2017-02-08 ENCOUNTER — Ambulatory Visit (HOSPITAL_COMMUNITY): Payer: Medicaid Other

## 2017-02-10 ENCOUNTER — Ambulatory Visit (HOSPITAL_COMMUNITY): Payer: Medicaid Other

## 2017-02-13 ENCOUNTER — Ambulatory Visit (HOSPITAL_COMMUNITY): Payer: Medicaid Other

## 2017-02-17 ENCOUNTER — Ambulatory Visit (HOSPITAL_COMMUNITY): Payer: Medicaid Other

## 2017-02-20 ENCOUNTER — Ambulatory Visit (HOSPITAL_COMMUNITY): Payer: Medicaid Other

## 2017-02-22 ENCOUNTER — Ambulatory Visit (HOSPITAL_COMMUNITY): Payer: Medicaid Other

## 2017-02-24 ENCOUNTER — Ambulatory Visit (HOSPITAL_COMMUNITY): Payer: Medicaid Other

## 2017-02-27 ENCOUNTER — Ambulatory Visit (HOSPITAL_COMMUNITY): Payer: Medicaid Other

## 2017-03-01 ENCOUNTER — Ambulatory Visit (HOSPITAL_COMMUNITY): Payer: Medicaid Other

## 2017-03-03 ENCOUNTER — Ambulatory Visit (HOSPITAL_COMMUNITY): Payer: Medicaid Other

## 2017-03-06 ENCOUNTER — Ambulatory Visit (HOSPITAL_COMMUNITY): Payer: Medicaid Other

## 2017-03-08 ENCOUNTER — Ambulatory Visit (HOSPITAL_COMMUNITY): Payer: Medicaid Other

## 2017-04-20 ENCOUNTER — Telehealth: Payer: Self-pay | Admitting: Cardiovascular Disease

## 2017-04-20 NOTE — Telephone Encounter (Signed)
Called patient back. Patient is complaining of SOB at night. Patient stated that he stopped his once a day medications and his SOB at night stopped. Patient stated he had no issues with Brilinta, Carvedilol, and Wellbutrin. That leaves Losartan, Imdur, Tricor, and Lipitor as possibly causing his SOB. Patient stated his BP and HR are good. Will forward to Dr. Eden EmmsNishan for advisement.

## 2017-04-20 NOTE — Telephone Encounter (Signed)
Can try to hold losartan and see if it is causing dyspnea

## 2017-04-20 NOTE — Telephone Encounter (Signed)
Called patient back about Dr. Fabio BeringNishan's recommendations. Patient will hold his losartan to see if this helps with his dyspnea. Patient verbalized understanding.

## 2017-04-20 NOTE — Telephone Encounter (Signed)
New message   Pt c/o Shortness Of Breath: STAT if SOB developed within the last 24 hours or pt is noticeably SOB on the phone  1. Are you currently SOB (can you hear that pt is SOB on the phone)? no  2. How long have you been experiencing SOB? Pt states this has happened for awhile  3. Are you SOB when sitting or when up moving around? Happens at night in the middle of his sleep and he cannot catch his breath  4. Are you currently experiencing any other symptoms? Pt notices when he stops taking all his meds he no longer has sob but when he takes them he has it

## 2017-07-05 ENCOUNTER — Encounter: Payer: Self-pay | Admitting: Cardiovascular Disease

## 2017-07-17 ENCOUNTER — Ambulatory Visit: Payer: Medicare Other | Admitting: Cardiovascular Disease

## 2017-07-20 ENCOUNTER — Encounter: Payer: Self-pay | Admitting: Cardiovascular Disease

## 2017-09-02 NOTE — Progress Notes (Signed)
Cardiology Office Note    Date:  09/04/2017   ID:  Peter Russo, DOB 1976/03/20, MRN [id number]  PCP:  Jaclyn Shaggy, MD  Cardiologist:  Dr. Eden Emms / Dr. Graciela Husbands (EP)  CC: post hosp fu - STEMI   History of Present Illness:  Peter Russo is a 42 y.o. male with a history of CAD s/p multiple previous PCIs to LAD and LCx and known CTO to RCA, ICM, HTN, HLD, obesity, GERD and pre diabetes.   He has a long hx of CAD with previous stenting of the LAD that was complicated by in stent restenosis with anterior STEMI in 1/07 requiring repeat intervention.He represented with chest pain in 03/16/2016 and underwent cath with intervention to the apical LAD. He then presented back 03/24/16 with chest pain and peak troponin of 3.38. Underwent cardiac cath showing patent stents with no new culprits for his reported symptoms. Echo at that time showed EF of 30-35% with diffuse hypokinesis and G2DD. Was maintained on ASA, statin, BB, ACEi, and Brilinta, with Imdur/spironolactone.   He was seen by me  9/17 and reported doing well, along with being compliant with home medications. Sherryll Burger was added to his medications. He was seen by Dr. Graciela Husbands in consideration for possible ICD placement but repeat echo showed improved EF to 40%.   He was admitted to [facility] 3/7-10/21/16 for anterior STEMI. On arrival, he was taken emergently to the cath lab. Culprit lesion, proximal LAD, was 100% stenosed. Balloon angioplasty alone was performed on the proximal LAD; no stents were placed given questionable medication compliance. He was also noted to have CTO of the RCA, 10% prox-mid Cx and 45% 2nd diagonal. Decision was made to treat him medically. He was continued on ASA/Brilitna, lipitor, coreg, losartan and imdur. Per discharge summary Entresto and spironolactone were discontinued.   He has had issues with dyspnea and brilinta stopped Recently had ARB stopped to see if it helps Dyspnea does not sound like CHF no better off ARB  and BP up    Past Medical History:  Diagnosis Date  . Anxiety   . CAD S/P percutaneous coronary angioplasty    a. s/p BMS-mLAD 2010. b. DES to RCA 2012. c. 04/2015: DES to LCx and PCI to LAD c/b dissection with subsequent overlapping DES to mLAD - r/i for NSTEMI afterwards; d. 08/2015 Ant STEMI in setting of noncompliance->136mLAD ISR (3.0x16 Synergy DES); e. 03/2016 PCI of apical LAD; f. 03/2016 Relook Cath: patent LAD stents, RCA 100p CTO-->Med Rx; g. 10/2016 Ant STEMI: PTCA 100p LAD.  Marland Kitchen Chronic combined systolic and diastolic CHF (congestive heart failure) (HCC)    a. 03/2016 Echo: EF 30-35%, Gr2 DD;  b. 08/2016 Echo: EF 40%.  . Depression   . Essential hypertension   . GAD (generalized anxiety disorder)   . GERD (gastroesophageal reflux disease)   . Hypercholesteremia   . Ischemic cardiomyopathy    a. prior EF 25-35 percent at cath, 35-40 percent by echo; b. 03/2016 Echo: EF 30-35%, diff HK, Gr2 DD; c. 08/2016 Echo: EF 40%, base/mid inferior/inferowseptal AK, mildly dil LA.  . Morbid obesity (HCC)   . Pre-diabetes   . SVT (supraventricular tachycardia) (HCC) APRROX 10 YRS AGO   PRESUMED AVNRT, ECHO 12/07 WITH EF 65%, MILD LAE  . Tobacco abuse    a. 20 + pack years, quit 10/2016.    Past Surgical History:  Procedure Laterality Date  . CARDIAC CATHETERIZATION N/A 04/23/2015   Procedure: Left Heart Cath and Coronary Angiography;  Surgeon: Wendall StadePeter C Marquite Attwood, MD;  [facility];  Service: Cardiovascular;  Laterality: N/A;  . CARDIAC CATHETERIZATION N/A 04/23/2015   Procedure: Coronary Stent Intervention;  Surgeon: Tonny BollmanMichael Cooper, MD;  [facility];  Service: Cardiovascular;  Laterality: N/A;  . CARDIAC CATHETERIZATION N/A 09/14/2015   Procedure: Left Heart Cath and Coronary Angiography;  Surgeon: Lyn RecordsHenry W Smith, MD;  LAD 100%, CFX 10% ISR, RCA 100% (chronic), EF 25-35%  . CARDIAC CATHETERIZATION N/A 09/14/2015   Procedure: Coronary Stent Intervention;  Surgeon: Lyn RecordsHenry W  Smith, MD;  [facility];  Service: Cardiovascular;  Laterality: N/A; 3.0 x 16 mm Synergy DES LAD  . CARDIAC CATHETERIZATION  03/16/2016  . CARDIAC CATHETERIZATION N/A 03/16/2016   Procedure: Left Heart Cath and Coronary Angiography;  Surgeon: Lyn RecordsHenry W Smith, MD;  [facility];  Service: Cardiovascular;  Laterality: N/A;  . CARDIAC CATHETERIZATION N/A 03/16/2016   Procedure: Coronary Balloon Angioplasty;  Surgeon: Lyn RecordsHenry W Smith, MD;  [facility];  Service: Cardiovascular;  Laterality: N/A;  . CARDIAC CATHETERIZATION N/A 03/25/2016   Procedure: Left Heart Cath and Coronary Angiography;  Surgeon: Tarquin Welcher M SwazilandJordan, MD;  [facility];  Service: Cardiovascular;  Laterality: N/A;  . CORONARY ANGIOPLASTY WITH STENT PLACEMENT  2010; 2013  . CORONARY BALLOON ANGIOPLASTY N/A 10/19/2016   Procedure: Coronary Balloon Angioplasty;  Surgeon: Corky CraftsJayadeep S Varanasi, MD;  [facility];  Service: Cardiovascular;  Laterality: N/A;  . INTRAVASCULAR ULTRASOUND/IVUS N/A 10/19/2016   Procedure: Intravascular Ultrasound/IVUS;  Surgeon: Corky CraftsJayadeep S Varanasi, MD;  [facility];  Service: Cardiovascular;  Laterality: N/A;  . LEFT HEART CATH AND CORONARY ANGIOGRAPHY N/A 10/19/2016   Procedure: Left Heart Cath and Coronary Angiography;  Surgeon: Corky CraftsJayadeep S Varanasi, MD;  [facility];  Service: Cardiovascular;  Laterality: N/A;    Current Medications: Outpatient Medications Prior to Visit  Medication Sig Dispense Refill  . acetaminophen (TYLENOL) 325 MG tablet Take 2 tablets (650 mg total) by mouth every 4 (four) hours as needed for headache or mild pain.    Marland Kitchen. aspirin 81 MG EC tablet Take 1 tablet (81 mg total) by mouth daily. 90 tablet 3  . atorvastatin (LIPITOR) 80 MG tablet Take 1 tablet (80 mg total) by mouth daily at 6 PM. 90 tablet 3  . buPROPion (WELLBUTRIN SR) 150 MG 12 hr tablet Take 1 tablet (150 mg total) by mouth 2  (two) times daily. 60 tablet 11  . fenofibrate (TRICOR) 48 MG tablet Take 1 tablet (48 mg total) by mouth daily. 30 tablet 11  . ibuprofen (ADVIL,MOTRIN) 600 MG tablet Take 1 tablet (600 mg total) by mouth every 6 (six) hours as needed. 15 tablet 0  . isosorbide mononitrate (IMDUR) 30 MG 24 hr tablet Take 1 tablet (30 mg total) by mouth daily. 30 tablet 6  . nitroGLYCERIN (NITROSTAT) 0.4 MG SL tablet Place 1 tablet (0.4 mg total) under the tongue every 5 (five) minutes as needed for chest pain (3 x). 25 tablet 2  . ticagrelor (BRILINTA) 90 MG TABS tablet Take 1 tablet (90 mg total) by mouth 2 (two) times daily. 180 tablet 3  . carvedilol (COREG) 12.5 MG tablet Take 1 tablet (12.5 mg total) by mouth 2 (two) times daily. 60 tablet 6  . losartan (COZAAR) 25 MG tablet Take 1 tablet (25 mg total) by mouth daily. 30 tablet 11   No facility-administered medications  prior to visit.      Allergies:   Patient has no known allergies.   Social History   Socioeconomic History  . Marital status: Single    Spouse name: None  . Number of children: None  . Years of education: None  . Highest education level: None  Social Needs  . Financial resource strain: None  . Food insecurity - worry: None  . Food insecurity - inability: None  . Transportation needs - medical: None  . Transportation needs - non-medical: None  Occupational History  . Occupation: Agricultural engineer: [employer]  Tobacco Use  . Smoking status: Current Some Day Smoker    Packs/day: 1.00    Years: 24.00    Pack years: 24.00    Last attempt to quit: 11/03/2016    Years since quitting: 0.8  . Smokeless tobacco: Never Used  . Tobacco comment: quit on 03/15/16  Substance and Sexual Activity  . Alcohol use: No  . Drug use: Yes    Types: Marijuana    Comment: last time 02/2016  . Sexual activity: Yes  Other Topics Concern  . None  Social History Narrative   Lives in [place] by himself.  Sometimes stays with girlfriend in  [place].  Does not routinely exercise.     Family History:  The patient's family history includes Arrhythmia in his mother; Coronary artery disease in his mother; Coronary artery disease (age of onset: 30) in his father; Heart attack in his brother, father, and mother; Heart disease in his father; Hypertension in his mother.      ROS:   Please see the history of present illness.    ROS All other systems reviewed and are negative.   PHYSICAL EXAM:   VS:  BP (!) 155/103   Pulse 96   Ht 5\' 7"  (1.702 m)   Wt 272 lb 3.2 oz (123.5 kg)   SpO2 96%   BMI 42.63 kg/m     Affect appropriate Black male obese  HEENT: normal Neck supple with no adenopathy JVP normal no bruits no thyromegaly Lungs clear with no wheezing and good diaphragmatic motion Heart:  S1/S2 no murmur, no rub, gallop or click PMI increased  Abdomen: benighn, BS positve, no tenderness, no AAA no bruit.  No HSM or HJR Distal pulses intact with no bruits No edema Neuro non-focal Skin warm and dry No muscular weakness     Wt Readings from Last 3 Encounters:  09/04/17 272 lb 3.2 oz (123.5 kg)  12/05/16 276 lb 1.9 oz (125.2 kg)  11/18/16 268 lb (121.6 kg)      Studies/Labs Reviewed:   EKG:   11/17/16 SR rate 85 old anterolateral MI LAE   Recent Labs: 10/19/2016: ALT 38 11/11/2016: B Natriuretic Peptide 190.0 11/17/2016: BUN 12; Creatinine, Ser 1.14; Hemoglobin 14.0; Platelets 227; Potassium 3.6; Sodium 137; TSH 1.920   Lipid Panel    Component Value Date/Time   CHOL 279 (H) 10/19/2016 1312   TRIG 313 (H) 10/19/2016 1312   HDL 33 (L) 10/19/2016 1312   CHOLHDL 8.5 10/19/2016 1312   VLDL 63 (H) 10/19/2016 1312   LDLCALC 183 (H) 10/19/2016 1312    Additional studies/ records that were reviewed today include:  2D Echocardiogram 09/08/16  Moderate LVE EF 40% inferior , apical and septal RWMAls   _____________  Cardiac Catheterization8.11.2017  Coronary Findings  Dominance: Right  Left Anterior  Descending  Prox LAD lesion, 0% stenosed. Prox LAD lesion with no stenosis was  previously treated.  Prox LAD to Dist LAD lesion, 0% stenosed. Prox LAD to Dist LAD lesion with no stenosis was previously treated.  Dist LAD lesion, 25% stenosed.  First Diagonal Branch  Vessel is small in size.  Second Diagonal Branch  2nd Diag lesion, 45% stenosed. The lesion is dissected.  Left Circumflex  Prox Cx to Mid Cx lesion, 10% stenosed. The lesion was previously treated.  Third Obtuse Marginal Branch  Vessel is small in size.  Right Coronary Artery  Dist RCA filled by collaterals from Prox RCA.  Prox RCA lesion, 100% stenosed.   Coronary Diagrams     _____________  10/19/16 Coronary Balloon Angioplasty  Intravascular Ultrasound/IVUS  Left Heart Cath and Coronary Angiography  Conclusion     Prox Cx to Mid Cx lesion, 10 %stenosed.  Prox RCA lesion, 100 %stenosed. Chronic total occlusion.  2nd Diag lesion, 45 %stenosed.  Prox LAD lesion, 100 %stenosed. This was the culprit lesion and treated with thrombectomy and angioplasty, > 4 mm. IVUS showed the vessel diameter at approximaltely 4 mm.  Post intervention, there is a 0% residual stenosis.  There is no aortic valve stenosis.  LV end diastolic pressure is severely elevated.   He needs aggressive risk factor modification. He needs to be compliant with dual antiplatelet therapy. We elected not to place any additional stents since there were questions about compliance. There was inadequate result with just balloon dilatation with intravascular ultrasound guidance. He states he was taking his Brilinta. In the event that he had missed some doses, we gave a dose of intravenous tirofiban. He was again loaded with oral Brilinta. He'll need aggressive medical therapy for his systolic dysfunction. Elevated LVEDP. Hold off on IV fluids at this point.  He will be watched in the CCU.     ASSESSMENT & PLAN:   CAD: with anterior STEMI.  Emergent cath 10/19/16  showed 100% prox LAD occlusion s/p thrombectomy and PCTA. Dr. Eldridge Dace elected not to place any additional stents since there were questions about compliance Known CTO RCA  No angina has nitro continue medical RX  .   Ischemic CM: Last echo 10/19/16 EF 40% will call in PRN aldactone see if this helps    HTN: back on ARB increase cozaar to 50 mg    HLD: LDL 183 on most recent lipid panel. Compliance with statin also and issue  Obesity: Body mass index is 42.63 kg/m. Weight loss recommended  Pre diabetes: Hg A1c 6.1. Counseled on importance of diet and exercise.   Tobacco abuse: he still smokes a few times a week when he gets stressed.   Depression: on Welbutrin f/u primary      [employer]

## 2017-09-04 ENCOUNTER — Encounter: Payer: Self-pay | Admitting: Cardiovascular Disease

## 2017-09-04 ENCOUNTER — Ambulatory Visit (INDEPENDENT_AMBULATORY_CARE_PROVIDER_SITE_OTHER): Payer: Medicare Other | Admitting: Cardiovascular Disease

## 2017-09-04 VITALS — BP 155/103 | HR 96 | Ht 67.0 in | Wt 272.2 lb

## 2017-09-04 DIAGNOSIS — I2510 Atherosclerotic heart disease of native coronary artery without angina pectoris: Secondary | ICD-10-CM | POA: Diagnosis not present

## 2017-09-04 MED ORDER — LOSARTAN POTASSIUM 50 MG PO TABS: 50.0000 mg | ORAL_TABLET | Freq: Every day | ORAL | 3 refills | Status: DC

## 2017-09-04 MED ORDER — SPIRONOLACTONE 25 MG PO TABS: 25.0000 mg | ORAL_TABLET | Freq: Every day | ORAL | 3 refills | Status: DC | PRN

## 2017-09-04 NOTE — Patient Instructions (Addendum)
Medication Instructions:  Your physician has recommended you make the following change in your medication:  1-Losartan Cozaar 50 mg by mouth daily 2-Aldactone 25 mg by mouth as needed for edema and shortness of breath.   Labwork: NONE  Testing/Procedures: NONE  Follow-Up: Your physician wants you to follow-up in: 6 months with Dr. Eden Emms. You will receive a reminder letter in the mail two months in advance. If you don't receive a letter, please call our office to schedule the follow-up appointment.   If you need a refill on your cardiac medications before your next appointment, please call your pharmacy.   Losartan tablets What is this medicine? LOSARTAN (loe SAR tan) is used to treat high blood pressure and to reduce the risk of stroke in certain patients. This drug also slows the progression of kidney disease in patients with diabetes. This medicine may be used for other purposes; ask your health care provider or pharmacist if you have questions. COMMON BRAND NAME(S): Cozaar What should I tell my health care provider before I take this medicine? They need to know if you have any of these conditions: -heart failure -kidney or liver disease -an unusual or allergic reaction to losartan, other medicines, foods, dyes, or preservatives -pregnant or trying to get pregnant -breast-feeding How should I use this medicine? Take this medicine by mouth with a glass of water. Follow the directions on the prescription label. This medicine can be taken with or without food. Take your doses at regular intervals. Do not take your medicine more often than directed. Talk to your pediatrician regarding the use of this medicine in children. Special care may be needed. Overdosage: If you think you have taken too much of this medicine contact a poison control center or emergency room at once. NOTE: This medicine is only for you. Do not share this medicine with others. What if I miss a dose? If you miss  a dose, take it as soon as you can. If it is almost time for your next dose, take only that dose. Do not take double or extra doses. What may interact with this medicine? -blood pressure medicines -diuretics, especially triamterene, spironolactone, or amiloride -fluconazole -NSAIDs, medicines for pain and inflammation, like ibuprofen or naproxen -potassium salts or potassium supplements -rifampin This list may not describe all possible interactions. Give your health care provider a list of all the medicines, herbs, non-prescription drugs, or dietary supplements you use. Also tell them if you smoke, drink alcohol, or use illegal drugs. Some items may interact with your medicine. What should I watch for while using this medicine? Visit your doctor or health care professional for regular checks on your progress. Check your blood pressure as directed. Ask your doctor or health care professional what your blood pressure should be and when you should contact him or her. Call your doctor or health care professional if you notice an irregular or fast heart beat. Women should inform their doctor if they wish to become pregnant or think they might be pregnant. There is a potential for serious side effects to an unborn child, particularly in the second or third trimester. Talk to your health care professional or pharmacist for more information. You may get drowsy or dizzy. Do not drive, use machinery, or do anything that needs mental alertness until you know how this drug affects you. Do not stand or sit up quickly, especially if you are an older patient. This reduces the risk of dizzy or fainting spells. Alcohol can make  you more drowsy and dizzy. Avoid alcoholic drinks. Avoid salt substitutes unless you are told otherwise by your doctor or health care professional. Do not treat yourself for coughs, colds, or pain while you are taking this medicine without asking your doctor or health care professional for  advice. Some ingredients may increase your blood pressure. What side effects may I notice from receiving this medicine? Side effects that you should report to your doctor or health care professional as soon as possible: -confusion, dizziness, light headedness or fainting spells -decreased amount of urine passed -difficulty breathing or swallowing, hoarseness, or tightening of the throat -fast or irregular heart beat, palpitations, or chest pain -skin rash, itching -swelling of your face, lips, tongue, hands, or feet Side effects that usually do not require medical attention (report to your doctor or health care professional if they continue or are bothersome): -cough -decreased sexual function or desire -headache -nasal congestion or stuffiness -nausea or stomach pain -sore or cramping muscles This list may not describe all possible side effects. Call your doctor for medical advice about side effects. You may report side effects to FDA at 1-800-FDA-1088. Where should I keep my medicine? Keep out of the reach of children. Store at room temperature between 15 and 30 degrees C (59 and 86 degrees F). Protect from light. Keep container tightly closed. Throw away any unused medicine after the expiration date. NOTE: This sheet is a summary. It may not cover all possible information. If you have questions about this medicine, talk to your doctor, pharmacist, or health care provider.  2018 Elsevier/Gold Standard (2007-10-12 16:42:18) Spironolactone tablets What is this medicine? SPIRONOLACTONE (speer on oh LAK tone) is a diuretic. It helps you make more urine and to lose excess water from your body. This medicine is used to treat high blood pressure, and edema or swelling from heart, kidney, or liver disease. It is also used to treat patients who make too much aldosterone or have low potassium. This medicine may be used for other purposes; ask your health care provider or pharmacist if you have  questions. COMMON BRAND NAME(S): Aldactone What should I tell my health care provider before I take this medicine? They need to know if you have any of these conditions: -high blood level of potassium -kidney disease or trouble making urine -liver disease -an unusual or allergic reaction to spironolactone, other medicines, foods, dyes, or preservatives -pregnant or trying to get pregnant -breast-feeding How should I use this medicine? Take this medicine by mouth with a drink of water. Follow the directions on your prescription label. You can take it with or without food. If it upsets your stomach, take it with food. Do not take your medicine more often than directed. Remember that you will need to pass more urine after taking this medicine. Do not take your doses at a time of day that will cause you problems. Do not take at bedtime. Talk to your pediatrician regarding the use of this medicine in children. While this drug may be prescribed for selected conditions, precautions do apply. Overdosage: If you think you have taken too much of this medicine contact a poison control center or emergency room at once. NOTE: This medicine is only for you. Do not share this medicine with others. What if I miss a dose? If you miss a dose, take it as soon as you can. If it is almost time for your next dose, take only that dose. Do not take double or extra doses. What  may interact with this medicine? Do not take this medicine with any of the following medications: -eplerenone This medicine may also interact with the following medications: -corticosteroids -digoxin -lithium -medicines for high blood pressure like ACE inhibitors -skeletal muscle relaxants like tubocurarine -NSAIDs, medicines for pain and inflammation, like ibuprofen or naproxen -potassium products like salt substitute or supplements -pressor amines like norepinephrine -some diuretics This list may not describe all possible interactions.  Give your health care provider a list of all the medicines, herbs, non-prescription drugs, or dietary supplements you use. Also tell them if you smoke, drink alcohol, or use illegal drugs. Some items may interact with your medicine. What should I watch for while using this medicine? Visit your doctor or health care professional for regular checks on your progress. Check your blood pressure as directed. Ask your doctor what your blood pressure should be, and when you should contact them. You may need to be on a special diet while taking this medicine. Ask your doctor. Also, ask how many glasses of fluid you need to drink a day. You must not get dehydrated. This medicine may make you feel confused, dizzy or lightheaded. Drinking alcohol and taking some medicines can make this worse. Do not drive, use machinery, or do anything that needs mental alertness until you know how this medicine affects you. Do not sit or stand up quickly. What side effects may I notice from receiving this medicine? Side effects that you should report to your doctor or health care professional as soon as possible: -allergic reactions such as skin rash or itching, hives, swelling of the lips, mouth, tongue, or throat -black or tarry stools -fast, irregular heartbeat -fever -muscle pain, cramps -numbness, tingling in hands or feet -trouble breathing -trouble passing urine -unusual bleeding -unusually weak or tired Side effects that usually do not require medical attention (report to your doctor or health care professional if they continue or are bothersome): -change in voice or hair growth -confusion -dizzy, drowsy -dry mouth, increased thirst -enlarged or tender breasts -headache -irregular menstrual periods -sexual difficulty, unable to have an erection -stomach upset This list may not describe all possible side effects. Call your doctor for medical advice about side effects. You may report side effects to FDA at  1-800-FDA-1088. Where should I keep my medicine? Keep out of the reach of children. Store below 25 degrees C (77 degrees F). Throw away any unused medicine after the expiration date. NOTE: This sheet is a summary. It may not cover all possible information. If you have questions about this medicine, talk to your doctor, pharmacist, or health care provider.  2018 Elsevier/Gold Standard (2010-04-13 12:51:30)

## 2017-10-13 ENCOUNTER — Other Ambulatory Visit: Payer: Self-pay | Admitting: Physician Assistant

## 2017-10-13 DIAGNOSIS — Z79899 Other long term (current) drug therapy: Secondary | ICD-10-CM

## 2017-10-13 DIAGNOSIS — E782 Mixed hyperlipidemia: Secondary | ICD-10-CM

## 2017-12-25 ENCOUNTER — Emergency Department (HOSPITAL_COMMUNITY): Payer: Medicare Other

## 2017-12-25 ENCOUNTER — Other Ambulatory Visit: Payer: Self-pay

## 2017-12-25 ENCOUNTER — Encounter (HOSPITAL_COMMUNITY): Payer: Self-pay

## 2017-12-25 ENCOUNTER — Emergency Department (HOSPITAL_COMMUNITY)
Admission: EM | Admit: 2017-12-25 | Discharge: 2017-12-25 | Disposition: A | Discharge status: Home / Self Care | Payer: Medicare Other | Attending: Emergency Medicine | Admitting: Emergency Medicine

## 2017-12-25 DIAGNOSIS — Z720 Tobacco use: Secondary | ICD-10-CM

## 2017-12-25 DIAGNOSIS — F17210 Nicotine dependence, cigarettes, uncomplicated: Secondary | ICD-10-CM | POA: Insufficient documentation

## 2017-12-25 DIAGNOSIS — R0602 Shortness of breath: Secondary | ICD-10-CM | POA: Insufficient documentation

## 2017-12-25 DIAGNOSIS — R05 Cough: Secondary | ICD-10-CM | POA: Insufficient documentation

## 2017-12-25 DIAGNOSIS — R059 Cough, unspecified: Secondary | ICD-10-CM

## 2017-12-25 DIAGNOSIS — I5043 Acute on chronic combined systolic (congestive) and diastolic (congestive) heart failure: Secondary | ICD-10-CM | POA: Insufficient documentation

## 2017-12-25 DIAGNOSIS — I2510 Atherosclerotic heart disease of native coronary artery without angina pectoris: Secondary | ICD-10-CM | POA: Insufficient documentation

## 2017-12-25 DIAGNOSIS — I110 Hypertensive heart disease with heart failure: Secondary | ICD-10-CM | POA: Insufficient documentation

## 2017-12-25 DIAGNOSIS — Z79899 Other long term (current) drug therapy: Secondary | ICD-10-CM | POA: Insufficient documentation

## 2017-12-25 DIAGNOSIS — Z7982 Long term (current) use of aspirin: Secondary | ICD-10-CM | POA: Insufficient documentation

## 2017-12-25 DIAGNOSIS — Z9114 Patient's other noncompliance with medication regimen: Secondary | ICD-10-CM

## 2017-12-25 LAB — BASIC METABOLIC PANEL
Anion gap: 8 (ref 5–15)
BUN: 9 mg/dL (ref 6–20)
CO2: 26 mmol/L (ref 22–32)
Calcium: 9.4 mg/dL (ref 8.9–10.3)
Chloride: 105 mmol/L (ref 101–111)
Creatinine, Ser: 1.27 mg/dL — ABNORMAL HIGH (ref 0.61–1.24)
GFR calc Af Amer: >60 mL/min (ref >60)
GFR calc non Af Amer: >60 mL/min (ref >60)
Glucose, Bld: 109 mg/dL — ABNORMAL HIGH (ref 65–99)
Potassium: 3.7 mmol/L (ref 3.5–5.1)
Sodium: 139 mmol/L (ref 135–145)

## 2017-12-25 LAB — I-STAT TROPONIN, ED: Troponin i, poc: 0 ng/mL (ref 0.00–0.08)

## 2017-12-25 LAB — CBC
HCT: 40.3 % (ref 39.0–52.0)
Hemoglobin: 13.8 g/dL (ref 13.0–17.0)
MCH: 29.4 pg (ref 26.0–34.0)
MCHC: 34.2 g/dL (ref 30.0–36.0)
MCV: 85.9 fL (ref 78.0–100.0)
Platelets: 222 10*3/uL (ref 150–400)
RBC: 4.69 MIL/uL (ref 4.22–5.81)
RDW: 14.1 % (ref 11.5–15.5)
WBC: 10.3 10*3/uL (ref 4.0–10.5)

## 2017-12-25 LAB — BRAIN NATRIURETIC PEPTIDE: B Natriuretic Peptide: 222.4 pg/mL — ABNORMAL HIGH (ref 0.0–100.0)

## 2017-12-25 MED ORDER — FUROSEMIDE 10 MG/ML IJ SOLN
20.0000 mg | Freq: Once | INTRAMUSCULAR | Status: AC
  Administered 2017-12-25: 20 mg via INTRAVENOUS
  Filled 2017-12-25: qty 2

## 2017-12-25 MED ORDER — SPIRONOLACTONE 25 MG PO TABS: ORAL_TABLET | ORAL | 0 refills | Status: DC

## 2017-12-25 NOTE — ED Notes (Signed)
ED Provider at bedside. 

## 2017-12-25 NOTE — ED Provider Notes (Signed)
[facility] EMERGENCY DEPARTMENT Provider Note   CSN: [id number] Arrival date & time: 12/25/17  1003     History   Chief Complaint Chief Complaint  Patient presents with  . Shortness of Breath    HPI Peter Russo is a 42 y.o. male with a PMHx of CAD/MI s/p DES/PCI multiple times, CHF (EF 40% in 08/2016), HTN, GERD, HLD, ischemic cardiomyopathy, borderline diabetes, DVT, tobacco abuse, and other medical conditions listed below, who presents to the ED with complaints of shortness of breath and dry cough that began around 8 AM today.  Patient states that he recently ran out of some of his medications including the one he takes for shortness of breath, he cannot recall the name of the ones he's out of but spironolactone  daily PRN is listed in his chart as being "for edema or SOB" so he thinks that's the one he's out of; states that the pharmacy wasn't open for him to get a refill so he hasn't had it in 4 days.  He states that his shortness of breath worsens with exertion and improves with rest, no other treatments were tried prior to arrival.  He admits to being a cigarette smoker.  Both his mother and father have had MIs and CHF.  His cardiologist is Dr. Eden Emms of [facility].  He denies ever being told he has COPD.   He denies diaphoresis, lightheadedness, fevers, chills, URI symptoms, wheezing, hemoptysis, CP, LE swelling, recent travel/surgery/immobilization, personal/family hx of DVT/PE, abd pain, N/V/D/C, hematuria, dysuria, myalgias, arthralgias, claudication, orthopnea, numbness, tingling, focal weakness, or any other complaints at this time.   The history is provided by the patient and medical records. No language interpreter was used.    Past Medical History:  Diagnosis Date  . Anxiety   . CAD S/P percutaneous coronary angioplasty    a. s/p BMS-mLAD 2010. b. DES to RCA 2012. c. 04/2015: DES to LCx and PCI to LAD c/b dissection with subsequent  overlapping DES to mLAD - r/i for NSTEMI afterwards; d. 08/2015 Ant STEMI in setting of noncompliance->18mLAD ISR (3.0x16 Synergy DES); e. 03/2016 PCI of apical LAD; f. 03/2016 Relook Cath: patent LAD stents, RCA 100p CTO-->Med Rx; g. 10/2016 Ant STEMI: PTCA 100p LAD.  Marland Kitchen Chronic combined systolic and diastolic CHF (congestive heart failure) (HCC)    a. 03/2016 Echo: EF 30-35%, Gr2 DD;  b. 08/2016 Echo: EF 40%.  . Depression   . Essential hypertension   . GAD (generalized anxiety disorder)   . GERD (gastroesophageal reflux disease)   . Hypercholesteremia   . Ischemic cardiomyopathy    a. prior EF 25-35 percent at cath, 35-40 percent by echo; b. 03/2016 Echo: EF 30-35%, diff HK, Gr2 DD; c. 08/2016 Echo: EF 40%, base/mid inferior/inferowseptal AK, mildly dil LA.  . Morbid obesity (HCC)   . Pre-diabetes   . SVT (supraventricular tachycardia) (HCC) APRROX 10 YRS AGO   PRESUMED AVNRT, ECHO 12/07 WITH EF 65%, MILD LAE  . Tobacco abuse    a. 20 + pack years, quit 10/2016.    Patient Active Problem List   Diagnosis Date Noted  . Stable angina (HCC)   . Elevated troponin   . Mixed hyperlipidemia   . Chest pain 11/17/2016  . CAD (coronary artery disease) 11/12/2016  . AKI (acute kidney injury) (HCC) 11/11/2016  . History of acute anterior wall MI 10/19/2016  . Chronic combined systolic and diastolic CHF (congestive heart failure) (HCC)   . Ischemic cardiomyopathy   .  H/O medication noncompliance   . Ischemic chest pain   . Morbid obesity (HCC)   . Pre-diabetes   . Tobacco abuse   . CAD S/P percutaneous coronary angioplasty   . Essential hypertension   . Cellulitis of hand 05/27/2015  . 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 05/26/2015  . NSTEMI (non-ST elevated myocardial infarction) (HCC) 04/24/2015  . Depression 12/26/2014  . GAD (generalized anxiety disorder) 12/26/2014  . GERD (gastroesophageal reflux disease) 03/01/2013  . Dyslipidemia 03/14/2011    Past Surgical History:    Procedure Laterality Date  . CARDIAC CATHETERIZATION N/A 04/23/2015   Procedure: Left Heart Cath and Coronary Angiography;  Surgeon: Wendall Stade, MD;  Location: [facility] INVASIVE CV LAB;  Service: Cardiovascular;  Laterality: N/A;  . CARDIAC CATHETERIZATION N/A 04/23/2015   Procedure: Coronary Stent Intervention;  Surgeon: Tonny Bollman, MD;  Location: [facility] INVASIVE CV LAB;  Service: Cardiovascular;  Laterality: N/A;  . CARDIAC CATHETERIZATION N/A 09/14/2015   Procedure: Left Heart Cath and Coronary Angiography;  Surgeon: Lyn Records, MD;  LAD 100%, CFX 10% ISR, RCA 100% (chronic), EF 25-35%  . CARDIAC CATHETERIZATION N/A 09/14/2015   Procedure: Coronary Stent Intervention;  Surgeon: Lyn Records, MD;  Location: [facility] INVASIVE CV LAB;  Service: Cardiovascular;  Laterality: N/A; 3.0 x 16 mm Synergy DES LAD  . CARDIAC CATHETERIZATION  03/16/2016  . CARDIAC CATHETERIZATION N/A 03/16/2016   Procedure: Left Heart Cath and Coronary Angiography;  Surgeon: Lyn Records, MD;  Location: [facility] INVASIVE CV LAB;  Service: Cardiovascular;  Laterality: N/A;  . CARDIAC CATHETERIZATION N/A 03/16/2016   Procedure: Coronary Balloon Angioplasty;  Surgeon: Lyn Records, MD;  Location: [facility] INVASIVE CV LAB;  Service: Cardiovascular;  Laterality: N/A;  . CARDIAC CATHETERIZATION N/A 03/25/2016   Procedure: Left Heart Cath and Coronary Angiography;  Surgeon: Peter M Swaziland, MD;  Location: [facility] INVASIVE CV LAB;  Service: Cardiovascular;  Laterality: N/A;  . CORONARY ANGIOPLASTY WITH STENT PLACEMENT  2010; 2013  . CORONARY BALLOON ANGIOPLASTY N/A 10/19/2016   Procedure: Coronary Balloon Angioplasty;  Surgeon: Corky Crafts, MD;  Location: [facility] INVASIVE CV LAB;  Service: Cardiovascular;  Laterality: N/A;  . INTRAVASCULAR ULTRASOUND/IVUS N/A 10/19/2016   Procedure: Intravascular Ultrasound/IVUS;  Surgeon: Corky Crafts, MD;  Location: [facility] INVASIVE CV LAB;  Service: Cardiovascular;  Laterality: N/A;  . LEFT HEART CATH AND CORONARY  ANGIOGRAPHY N/A 10/19/2016   Procedure: Left Heart Cath and Coronary Angiography;  Surgeon: Corky Crafts, MD;  Location: [facility] INVASIVE CV LAB;  Service: Cardiovascular;  Laterality: N/A;        Home Medications    Prior to Admission medications   Medication Sig Start Date End Date Taking? Authorizing Provider  acetaminophen (TYLENOL) 325 MG tablet Take 2 tablets (650 mg total) by mouth every 4 (four) hours as needed for headache or mild pain. 11/13/16   Abelino Derrick, PA-C  aspirin 81 MG EC tablet Take 1 tablet (81 mg total) by mouth daily. 10/21/16   [facility], Roe Rutherford, PA  atorvastatin (LIPITOR) 80 MG tablet TAKE ONE TABLET BY MOUTH ONCE DAILY AT 6 PM 10/13/17   Wendall Stade, MD  buPROPion [facility] SR) 150 MG 12 hr tablet TAKE ONE TABLET BY MOUTH TWICE DAILY 10/13/17   Wendall Stade, MD  carvedilol (COREG) 12.5 MG tablet Take 1 tablet (12.5 mg total) by mouth 2 (two) times daily. 11/01/16 01/30/17  Janetta Hora, PA-C  fenofibrate (TRICOR) 48 MG tablet TAKE ONE TABLET  BY MOUTH ONCE DAILY 10/13/17   Wendall Stade, MD  ibuprofen (ADVIL,MOTRIN) 600 MG tablet Take 1 tablet (600 mg total) by mouth every 6 (six) hours as needed. 12/28/16   Hayden Rasmussen, NP  isosorbide mononitrate (IMDUR) 30 MG 24 hr tablet TAKE ONE TABLET BY MOUTH ONCE DAILY 10/13/17   Wendall Stade, MD  losartan (COZAAR) 50 MG tablet Take 1 tablet (50 mg total) by mouth daily. 09/04/17   Wendall Stade, MD  nitroGLYCERIN (NITROSTAT) 0.4 MG SL tablet Place 1 tablet (0.4 mg total) under the tongue every 5 (five) minutes as needed for chest pain (3 x). 10/21/16   [facility], Roe Rutherford, PA  spironolactone (ALDACTONE) 25 MG tablet Take 1 tablet (25 mg total) by mouth daily as needed (for edema and shortness of breath). 09/04/17   Wendall Stade, MD  ticagrelor (BRILINTA) 90 MG TABS tablet Take 1 tablet (90 mg total) by mouth 2 (two) times daily. 10/21/16   [facility], Roe Rutherford, PA    Family History Family History  Problem Relation  Age of Onset  . Arrhythmia Mother        MOTHER LIVED TO BE 102  . Coronary artery disease Mother   . Heart attack Mother   . Hypertension Mother   . Coronary artery disease Father 34  . Heart disease Father   . Heart attack Father   . Heart attack Brother   . Stroke Neg Hx     Social History Social History   Tobacco Use  . Smoking status: Current Some Day Smoker    Packs/day: 1.00    Years: 24.00    Pack years: 24.00    Last attempt to quit: 11/03/2016    Years since quitting: 1.1  . Smokeless tobacco: Never Used  . Tobacco comment: quit on 03/15/16  Substance Use Topics  . Alcohol use: No  . Drug use: Yes    Types: Marijuana    Comment: last time 02/2016     Allergies   Patient has no known allergies.   Review of Systems Review of Systems  Constitutional: Negative for chills, diaphoresis, fever and unexpected weight change.  HENT: Negative for rhinorrhea and sore throat.   Respiratory: Positive for cough and shortness of breath. Negative for wheezing.   Cardiovascular: Negative for chest pain and leg swelling.  Gastrointestinal: Negative for abdominal pain, constipation, diarrhea, nausea and vomiting.  Genitourinary: Negative for dysuria and hematuria.  Musculoskeletal: Negative for arthralgias and myalgias.  Skin: Negative for color change.  Allergic/Immunologic: Negative for immunocompromised state.  Neurological: Negative for weakness, light-headedness and numbness.  Psychiatric/Behavioral: Negative for confusion.   All other systems reviewed and are negative for acute change except as noted in the HPI.    Physical Exam Updated Vital Signs BP (!) 143/94 (BP Location: Right Arm)   Pulse 75   Temp 98 F (36.7 C) (Oral)   Resp 18   SpO2 100%   Physical Exam  Constitutional: He is oriented to person, place, and time. Vital signs are normal. He appears well-developed and well-nourished.  Non-toxic appearance. No distress.  Afebrile, nontoxic, NAD  HENT:    Head: Normocephalic and atraumatic.  Mouth/Throat: Oropharynx is clear and moist and mucous membranes are normal.  Eyes: Conjunctivae and EOM are normal. Right eye exhibits no discharge. Left eye exhibits no discharge.  Neck: Normal range of motion. Neck supple.  Cardiovascular: Normal rate, regular rhythm, normal heart sounds and intact distal pulses. Exam reveals no gallop  and no friction rub.  No murmur heard. RRR, nl s1/s2, no m/r/g, distal pulses intact, trace b/l pedal edema   Pulmonary/Chest: Effort normal and breath sounds normal. No respiratory distress. He has no decreased breath sounds. He has no wheezes. He has no rhonchi. He has no rales.  CTAB in all lung fields, no w/r/r, no hypoxia or increased WOB, speaking in full sentences, SpO2 100% on RA   Abdominal: Soft. Normal appearance and bowel sounds are normal. He exhibits no distension. There is no tenderness. There is no rigidity, no rebound, no guarding, no CVA tenderness, no tenderness at McBurney's point and negative Murphy's sign.  Musculoskeletal: Normal range of motion.  MAE x4 Strength and sensation grossly intact in all extremities Distal pulses intact Gait steady Trace b/l pedal edema, neg homan's bilaterally   Neurological: He is alert and oriented to person, place, and time. He has normal strength. No sensory deficit.  Skin: Skin is warm, dry and intact. No rash noted.  Psychiatric: He has a normal mood and affect.  Nursing note and vitals reviewed.    ED Treatments / Results  Labs (all labs ordered are listed, but only abnormal results are displayed) Labs Reviewed  BASIC METABOLIC PANEL - Abnormal; Notable for the following components:      Result Value   Glucose, Bld 109 (*)    Creatinine, Ser 1.27 (*)    All other components within normal limits  BRAIN NATRIURETIC PEPTIDE - Abnormal; Notable for the following components:   B Natriuretic Peptide 222.4 (*)    All other components within normal limits   CBC  I-STAT TROPONIN, ED    EKG EKG Interpretation  Date/Time:  Monday Dec 25 2017 17:49:27 EDT Ventricular Rate:  83 PR Interval:    QRS Duration: 92 QT Interval:  372 QTC Calculation: 438 R Axis:   30 Text Interpretation:  Sinus rhythm LAE, consider biatrial enlargement Probable left ventricular hypertrophy Confirmed by Kristine Royal [phone]) on 12/25/2017 5:53:08 PM   Radiology Dg Chest 2 View  Result Date: 12/25/2017 CLINICAL DATA:  Acute onset of shortness of breath today associated with cough. History of CHF, coronary artery disease, previous MI, current smoker. EXAM: CHEST - 2 VIEW COMPARISON:  Chest x-ray of November 17, 2016 FINDINGS: The lungs are well-expanded. The interstitial markings are increased especially in the mid and lower lungs. The heart is top-normal in size. The pulmonary vascularity is mildly prominent. There is no pleural effusion. The bony thorax is unremarkable. IMPRESSION: Probable mild CHF superimposed upon COPD. There is no discrete pneumonia but early interstitial pneumonia is not excluded. Electronically Signed   By: David  Swaziland M.D.   On: 12/25/2017 10:46    Echo 09/08/16: Study Conclusions - Left ventricle: LVEF Is approximately 40% with hypokinesis of the   base / mid inerrior / inferoseptal walls akinesis of the distal   inferior/inferoseptal walls and apex.   No significant difference from echo of fall 2017 The cavity size   was moderately dilated. Wall thickness was normal. - Left atrium: The atrium was mildly dilated. Impressions: - Poor acoustic windows limit study   Procedures Procedures (including critical care time)  Medications Ordered in ED Medications  furosemide (LASIX) injection 20 mg (20 mg Intravenous Given 12/25/17 1747)     Initial Impression / Assessment and Plan / ED Course  I have reviewed the triage vital signs and the nursing notes.  Pertinent labs & imaging results that were available during my care of the  patient  were reviewed by me and considered in my medical decision making (see chart for details).     42 y.o. male here with SOB and cough that began today. On exam, no hypoxia or increased WOB, lung sounds clear, trace pedal edema, neg homan's sign, no tachycardia. Doubt PE. Has been out of multiple meds recently, can't remember which ones but states that he's out of the one for his SOB (looks like it'd be spironolactone since that one lists "for edema or SOB" on the rx). Work up thus far reveals: EKG fairly similar to prior, no acute ischemic findings. CXR with probable mild CHF superimposed on COPD. BMP with stable kidney function and otherwise unremarkable. CBC WNL. Troponin was not done initially, will get this done now; will also add-on BNP. Doubt PE, likely just mild CHF exacerbation, but pt ambulatory without increased WOB or any significant hypoxia on exam so doubt need for admission at this point. Will give low dose lasix since he doesn't appear to be on that at home, and then likely restart his spironolactone going home. Will reassess shortly.   8:55 PM Trop negative at 10hr Deagan, doubt need for second one. BNP elevated at 222.4 which is similar to prior value in 2018 (190.0 at that time). Pt feeling better and having good UOP response after lasix, symptoms resolved at this time. Overall, reassuring work up, symptoms likely from mild volume overload/CHF exacerbation from noncompliance on medication regimen. No hypoxia or other acutely concerning findings, doubt need for admission. Will have pt start back on spironolactone daily x4 days then as previously prescribed thereafter, advised importance of use of home medications. DASH diet advised. Discussed importance of smoking cessation. Advised other OTC remedies for symptomatic relief. F/up with PCP in 1wk for recheck. I explained the diagnosis and have given explicit precautions to return to the ER including for any other new or worsening symptoms. The  patient understands and accepts the medical plan as it's been dictated and I have answered their questions. Discharge instructions concerning home care and prescriptions have been given. The patient is STABLE and is discharged to home in good condition.    Final Clinical Impressions(s) / ED Diagnoses   Final diagnoses:  SOB (shortness of breath)  Cough  Acute on chronic combined systolic and diastolic congestive heart failure (HCC)  Noncompliance with medications  Tobacco user    ED Discharge Orders        Ordered    spironolactone (ALDACTONE) 25 MG tablet     12/25/17 [address], [place], [place] 12/25/17 2056    Wynetta Fines, MD 12/27/17 1718

## 2017-12-25 NOTE — ED Triage Notes (Addendum)
Pt states he had acute onset of shortness of breath today. Pt reports some cough. Pt appears to be short of breath in triage. Pt states hx of CHF. Pt states being out of meds for 3 days. Speaking in complete sentences.

## 2017-12-25 NOTE — Discharge Instructions (Addendum)
Your work up today showed that you had some fluid build up in your lungs, which is probably why you're having shortness of breath. This is likely because you ran out of  your medications. It's very important that you take all of your home medications as directed. Use spironolactone as directed to help with excess fluid, taking it every day starting tomorrow and for the next 4 days, then going back to as needed thereafter. Continue to stay well-hydrated. STOP SMOKING! Eat a low-salt/sodium diet. Continue to alternate between Tylenol and Ibuprofen for pain or fever. Use Mucinex for cough suppression/expectoration of mucus. May consider over-the-counter Benadryl or other antihistamine to decrease secretions and for help with your symptoms. Follow up with your primary care doctor in 5-7 days for recheck of ongoing symptoms. Return to emergency department for emergent changing or worsening of symptoms.

## 2018-01-29 ENCOUNTER — Telehealth: Payer: Self-pay | Admitting: Cardiovascular Disease

## 2018-01-29 ENCOUNTER — Other Ambulatory Visit: Payer: Self-pay

## 2018-01-29 MED ORDER — CARVEDILOL 12.5 MG PO TABS: 12.5000 mg | ORAL_TABLET | Freq: Two times a day (BID) | ORAL | 7 refills | Status: DC

## 2018-01-29 MED ORDER — NITROGLYCERIN 0.4 MG SL SUBL: 0.4000 mg | SUBLINGUAL_TABLET | SUBLINGUAL | 5 refills | Status: DC | PRN

## 2018-01-29 MED ORDER — FENOFIBRATE 160 MG PO TABS: 160.0000 mg | ORAL_TABLET | Freq: Every day | ORAL | 3 refills | Status: DC

## 2018-01-29 MED ORDER — TICAGRELOR 90 MG PO TABS: 90.0000 mg | ORAL_TABLET | Freq: Two times a day (BID) | ORAL | 1 refills | Status: DC

## 2018-01-29 NOTE — Telephone Encounter (Signed)
That's fine

## 2018-01-29 NOTE — Telephone Encounter (Signed)
Sent electronic prescription for fenofibrate 160 mg .

## 2018-01-29 NOTE — Telephone Encounter (Signed)
Victor/Pharamist is calling and stated pt's insurance doesn't cover the Fenofibrate 48 mg can pt be prescribe 160 mg instead. Please advise and send electronically  for new prescription.

## 2018-01-29 NOTE — Telephone Encounter (Signed)
Will send to Dr. Nishan for advisement. 

## 2018-04-19 ENCOUNTER — Telehealth: Payer: Self-pay | Admitting: *Deleted

## 2018-04-19 NOTE — Telephone Encounter (Signed)
Prior authorization faxed to [facility] for FENOFIBRATE as requested by [facility].

## 2018-04-24 NOTE — Telephone Encounter (Signed)
**Note De-Identified Lani Mendiola Obfuscation** Letter received from [employer] stating that Fenofibrate is on the pts plan's list of covered drugs.  I have notified the pts pharmacy.

## 2018-05-28 ENCOUNTER — Encounter: Payer: Self-pay | Admitting: Emergency Medicine

## 2018-05-28 ENCOUNTER — Emergency Department
Admission: EM | Admit: 2018-05-28 | Discharge: 2018-05-28 | Disposition: A | Discharge status: Home / Self Care | Payer: Medicare Other | Attending: Emergency Medicine | Admitting: Emergency Medicine

## 2018-05-28 ENCOUNTER — Emergency Department: Payer: Medicare Other

## 2018-05-28 ENCOUNTER — Other Ambulatory Visit: Payer: Self-pay

## 2018-05-28 DIAGNOSIS — I2510 Atherosclerotic heart disease of native coronary artery without angina pectoris: Secondary | ICD-10-CM | POA: Diagnosis not present

## 2018-05-28 DIAGNOSIS — F17200 Nicotine dependence, unspecified, uncomplicated: Secondary | ICD-10-CM | POA: Diagnosis not present

## 2018-05-28 DIAGNOSIS — I509 Heart failure, unspecified: Secondary | ICD-10-CM

## 2018-05-28 DIAGNOSIS — I5043 Acute on chronic combined systolic (congestive) and diastolic (congestive) heart failure: Secondary | ICD-10-CM | POA: Diagnosis not present

## 2018-05-28 DIAGNOSIS — Z7982 Long term (current) use of aspirin: Secondary | ICD-10-CM | POA: Insufficient documentation

## 2018-05-28 DIAGNOSIS — R0602 Shortness of breath: Secondary | ICD-10-CM

## 2018-05-28 DIAGNOSIS — Z79899 Other long term (current) drug therapy: Secondary | ICD-10-CM | POA: Insufficient documentation

## 2018-05-28 DIAGNOSIS — I110 Hypertensive heart disease with heart failure: Secondary | ICD-10-CM | POA: Diagnosis not present

## 2018-05-28 LAB — TROPONIN I
Troponin I: 0.03 ng/mL (ref <0.03)
Troponin I: <0.03 ng/mL (ref <0.03)

## 2018-05-28 LAB — CBC
HCT: 41.8 % (ref 39.0–52.0)
Hemoglobin: 14 g/dL (ref 13.0–17.0)
MCH: 28.5 pg (ref 26.0–34.0)
MCHC: 33.5 g/dL (ref 30.0–36.0)
MCV: 85 fL (ref 80.0–100.0)
Platelets: 248 10*3/uL (ref 150–400)
RBC: 4.92 MIL/uL (ref 4.22–5.81)
RDW: 14.2 % (ref 11.5–15.5)
WBC: 9.8 10*3/uL (ref 4.0–10.5)
nRBC: 0 % (ref 0.0–0.2)

## 2018-05-28 LAB — COMPREHENSIVE METABOLIC PANEL
ALT: 20 U/L (ref 0–44)
AST: 25 U/L (ref 15–41)
Albumin: 3.7 g/dL (ref 3.5–5.0)
Alkaline Phosphatase: 67 U/L (ref 38–126)
Anion gap: 9 (ref 5–15)
BUN: 14 mg/dL (ref 6–20)
CO2: 27 mmol/L (ref 22–32)
Calcium: 9.2 mg/dL (ref 8.9–10.3)
Chloride: 103 mmol/L (ref 98–111)
Creatinine, Ser: 1.12 mg/dL (ref 0.61–1.24)
GFR calc Af Amer: >60 mL/min (ref >60)
GFR calc non Af Amer: >60 mL/min (ref >60)
Glucose, Bld: 137 mg/dL — ABNORMAL HIGH (ref 70–99)
Potassium: 4.4 mmol/L (ref 3.5–5.1)
Sodium: 139 mmol/L (ref 135–145)
Total Bilirubin: 0.7 mg/dL (ref 0.3–1.2)
Total Protein: 7.8 g/dL (ref 6.5–8.1)

## 2018-05-28 LAB — BRAIN NATRIURETIC PEPTIDE: B Natriuretic Peptide: 99 pg/mL (ref 0.0–100.0)

## 2018-05-28 MED ORDER — FUROSEMIDE 20 MG PO TABS: 20.0000 mg | ORAL_TABLET | Freq: Every day | ORAL | 0 refills | Status: DC

## 2018-05-28 NOTE — ED Notes (Signed)
Critical value troponin 0.03

## 2018-05-28 NOTE — ED Provider Notes (Signed)
[facility] Emergency Department Provider Note  Time seen: 1:21 PM  I have reviewed the triage vital signs and the nursing notes.   HISTORY  Chief Complaint Shortness of Breath    HPI Peter Russo is a 42 y.o. male with a past medical history of anxiety, CAD with 4 prior myocardial infarctions with several stents, last of which was approximately 2 years ago, presents to the emergency department for shortness of breath.  According to the patient since this morning he has been feeling somewhat more short of breath somewhat worse with exertion.  Describes shortness of breath is mild.  Denies any chest pain.  Denies any leg swelling, nausea or diaphoresis.  Patient states this is happened before and he was put on fluid pills which helped but does not typically take a fluid pill at baseline.   Past Medical History:  Diagnosis Date  . Anxiety   . CAD S/P percutaneous coronary angioplasty    a. s/p BMS-mLAD 2010. b. DES to RCA 2012. c. 04/2015: DES to LCx and PCI to LAD c/b dissection with subsequent overlapping DES to mLAD - r/i for NSTEMI afterwards; d. 08/2015 Ant STEMI in setting of noncompliance->131mLAD ISR (3.0x16 Synergy DES); e. 03/2016 PCI of apical LAD; f. 03/2016 Relook Cath: patent LAD stents, RCA 100p CTO-->Med Rx; g. 10/2016 Ant STEMI: PTCA 100p LAD.  Marland Kitchen Chronic combined systolic and diastolic CHF (congestive heart failure) (HCC)    a. 03/2016 Echo: EF 30-35%, Gr2 DD;  b. 08/2016 Echo: EF 40%.  . Depression   . Essential hypertension   . GAD (generalized anxiety disorder)   . GERD (gastroesophageal reflux disease)   . Hypercholesteremia   . Ischemic cardiomyopathy    a. prior EF 25-35 percent at cath, 35-40 percent by echo; b. 03/2016 Echo: EF 30-35%, diff HK, Gr2 DD; c. 08/2016 Echo: EF 40%, base/mid inferior/inferowseptal AK, mildly dil LA.  . Morbid obesity (HCC)   . Pre-diabetes   . SVT (supraventricular tachycardia) (HCC) APRROX 10 YRS AGO   PRESUMED  AVNRT, ECHO 12/07 WITH EF 65%, MILD LAE  . Tobacco abuse    a. 20 + pack years, quit 10/2016.    Patient Active Problem List   Diagnosis Date Noted  . Stable angina (HCC)   . Elevated troponin   . Mixed hyperlipidemia   . Chest pain 11/17/2016  . CAD (coronary artery disease) 11/12/2016  . AKI (acute kidney injury) (HCC) 11/11/2016  . History of acute anterior wall MI 10/19/2016  . Chronic combined systolic and diastolic CHF (congestive heart failure) (HCC)   . Ischemic cardiomyopathy   . H/O medication noncompliance   . Ischemic chest pain (HCC)   . Morbid obesity (HCC)   . Pre-diabetes   . Tobacco abuse   . CAD S/P percutaneous coronary angioplasty   . Essential hypertension   . Cellulitis of hand 05/27/2015  . 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 05/26/2015  . NSTEMI (non-ST elevated myocardial infarction) (HCC) 04/24/2015  . Depression 12/26/2014  . GAD (generalized anxiety disorder) 12/26/2014  . GERD (gastroesophageal reflux disease) 03/01/2013  . Dyslipidemia 03/14/2011    Past Surgical History:  Procedure Laterality Date  . CARDIAC CATHETERIZATION N/A 04/23/2015   Procedure: Left Heart Cath and Coronary Angiography;  Surgeon: Wendall Stade, MD;  Location: [facility] INVASIVE CV LAB;  Service: Cardiovascular;  Laterality: N/A;  . CARDIAC CATHETERIZATION N/A 04/23/2015   Procedure: Coronary Stent Intervention;  Surgeon: Tonny Bollman, MD;  Location:  [facility] INVASIVE CV LAB;  Service: Cardiovascular;  Laterality: N/A;  . CARDIAC CATHETERIZATION N/A 09/14/2015   Procedure: Left Heart Cath and Coronary Angiography;  Surgeon: Lyn Records, MD;  LAD 100%, CFX 10% ISR, RCA 100% (chronic), EF 25-35%  . CARDIAC CATHETERIZATION N/A 09/14/2015   Procedure: Coronary Stent Intervention;  Surgeon: Lyn Records, MD;  Location: [facility] INVASIVE CV LAB;  Service: Cardiovascular;  Laterality: N/A; 3.0 x 16 mm Synergy DES LAD  . CARDIAC CATHETERIZATION  03/16/2016  . CARDIAC CATHETERIZATION N/A  03/16/2016   Procedure: Left Heart Cath and Coronary Angiography;  Surgeon: Lyn Records, MD;  Location: [facility] INVASIVE CV LAB;  Service: Cardiovascular;  Laterality: N/A;  . CARDIAC CATHETERIZATION N/A 03/16/2016   Procedure: Coronary Balloon Angioplasty;  Surgeon: Lyn Records, MD;  Location: [facility] INVASIVE CV LAB;  Service: Cardiovascular;  Laterality: N/A;  . CARDIAC CATHETERIZATION N/A 03/25/2016   Procedure: Left Heart Cath and Coronary Angiography;  Surgeon: Peter M Swaziland, MD;  Location: [facility] INVASIVE CV LAB;  Service: Cardiovascular;  Laterality: N/A;  . CORONARY ANGIOPLASTY WITH STENT PLACEMENT  2010; 2013  . CORONARY BALLOON ANGIOPLASTY N/A 10/19/2016   Procedure: Coronary Balloon Angioplasty;  Surgeon: Corky Crafts, MD;  Location: [facility] INVASIVE CV LAB;  Service: Cardiovascular;  Laterality: N/A;  . INTRAVASCULAR ULTRASOUND/IVUS N/A 10/19/2016   Procedure: Intravascular Ultrasound/IVUS;  Surgeon: Corky Crafts, MD;  Location: [facility] INVASIVE CV LAB;  Service: Cardiovascular;  Laterality: N/A;  . LEFT HEART CATH AND CORONARY ANGIOGRAPHY N/A 10/19/2016   Procedure: Left Heart Cath and Coronary Angiography;  Surgeon: Corky Crafts, MD;  Location: [facility] INVASIVE CV LAB;  Service: Cardiovascular;  Laterality: N/A;    Prior to Admission medications   Medication Sig Start Date End Date Taking? Authorizing Provider  acetaminophen (TYLENOL) 325 MG tablet Take 2 tablets (650 mg total) by mouth every 4 (four) hours as needed for headache or mild pain. 11/13/16   Abelino Derrick, PA-C  aspirin 81 MG EC tablet Take 1 tablet (81 mg total) by mouth daily. 10/21/16   [facility], Roe Rutherford, PA  atorvastatin (LIPITOR) 80 MG tablet TAKE ONE TABLET BY MOUTH ONCE DAILY AT 6 PM 10/13/17   Wendall Stade, MD  buPROPion [facility] SR) 150 MG 12 hr tablet TAKE ONE TABLET BY MOUTH TWICE DAILY 10/13/17   Wendall Stade, MD  carvedilol (COREG) 12.5 MG tablet Take 1 tablet (12.5 mg total) by mouth 2 (two) times daily. 01/29/18  04/29/18  Wendall Stade, MD  fenofibrate 160 MG tablet Take 1 tablet (160 mg total) by mouth daily. 01/29/18   Wendall Stade, MD  isosorbide mononitrate (IMDUR) 30 MG 24 hr tablet TAKE ONE TABLET BY MOUTH ONCE DAILY 10/13/17   Wendall Stade, MD  losartan (COZAAR) 50 MG tablet Take 1 tablet (50 mg total) by mouth daily. 09/04/17   Wendall Stade, MD  nitroGLYCERIN (NITROSTAT) 0.4 MG SL tablet Place 1 tablet (0.4 mg total) under the tongue every 5 (five) minutes as needed for chest pain (3 x). 01/29/18   Wendall Stade, MD  spironolactone (ALDACTONE) 25 MG tablet Take 1 tablet (25 mg total) by mouth daily as needed (for edema and shortness of breath). 09/04/17   Wendall Stade, MD  spironolactone (ALDACTONE) 25 MG tablet Take 1 tablet (25mg ) by mouth daily for the next 4 days starting 12/26/17, then after that take 1 tablet (25mg ) by mouth daily as needed for edema/fluid retention or shortness  of breath. 12/25/17   Street, [place], PA-C  ticagrelor (BRILINTA) 90 MG TABS tablet Take 1 tablet (90 mg total) by mouth 2 (two) times daily. 01/29/18   Wendall Stade, MD    No Known Allergies  Family History  Problem Relation Age of Onset  . Arrhythmia Mother        MOTHER LIVED TO BE 89  . Coronary artery disease Mother   . Heart attack Mother   . Hypertension Mother   . Coronary artery disease Father 22  . Heart disease Father   . Heart attack Father   . Heart attack Brother   . Stroke Neg Hx     Social History Social History   Tobacco Use  . Smoking status: Current Some Day Smoker    Packs/day: 1.00    Years: 24.00    Pack years: 24.00    Last attempt to quit: 11/03/2016    Years since quitting: 1.5  . Smokeless tobacco: Never Used  . Tobacco comment: quit on 03/15/16  Substance Use Topics  . Alcohol use: No  . Drug use: Yes    Types: Marijuana    Comment: last time 02/2016    Review of Systems Constitutional: Negative for fever. Cardiovascular: Negative for chest  pain. Respiratory: Positive for intermittent shortness of breath Gastrointestinal: Negative for abdominal pain, vomiting  Genitourinary: Negative for urinary compaints Musculoskeletal: Negative for musculoskeletal complaints Skin: Negative for skin complaints  Neurological: Negative for headache All other ROS negative  ____________________________________________   PHYSICAL EXAM:  VITAL SIGNS: ED Triage Vitals  Enc Vitals Group     BP 05/28/18 1001 138/75     Pulse Rate 05/28/18 1001 83     Resp 05/28/18 1001 (!) 22     Temp 05/28/18 1001 98.2 F (36.8 C)     Temp Source 05/28/18 1001 Oral     SpO2 05/28/18 1001 100 %     Weight 05/28/18 1005 263 lb (119.3 kg)     Height 05/28/18 1005 5\' 7"  (1.702 m)     Head Circumference --      Peak Flow --      Pain Score 05/28/18 1004 0     Pain Loc --      Pain Edu? --      Excl. in GC? --    Constitutional: Alert and oriented. Well appearing and in no distress. Eyes: Normal exam ENT   Head: Normocephalic and atraumatic.   Mouth/Throat: Mucous membranes are moist. Cardiovascular: Normal rate, regular rhythm. No murmur Respiratory: Normal respiratory effort without tachypnea nor retractions. Breath sounds are clear  Gastrointestinal: Soft and nontender. No distention.   Musculoskeletal: Nontender with normal range of motion in all extremities.  Neurologic:  Normal speech and language. No gross focal neurologic deficits  Skin:  Skin is warm, dry and intact.  Psychiatric: Mood and affect are normal.   ____________________________________________    EKG  EKG reviewed and interpreted by myself shows a normal sinus rhythm 82 bpm with a narrow QRS, left axis deviation, largely normal intervals with nonspecific ST changes.  ____________________________________________    RADIOLOGY  Cardiomegaly with interstitial prominence mild CHF.  ____________________________________________   INITIAL IMPRESSION / ASSESSMENT AND  PLAN / ED COURSE  Pertinent labs & imaging results that were available during my care of the patient were reviewed by me and considered in my medical decision making (see chart for details).  Patient presents to the emergency department for shortness of breath since this  morning.  States mild shortness of breath currently satting 100% on room air respiratory rate around 20 breaths/min with otherwise normal/reassuring vitals.  Denies any cough or fever.  Denies any chest pain.  Differential this time would include ACS, CHF, pneumonia, pulmonary edema.  Patient's labs are troponin 0.03 otherwise reassuring, chest x-ray shows vascular congestion but no significant pulmonary edema.  EKG overall reassuring as well.  We will repeat a troponin at the patient's repeat troponin remains normal anticipate likely discharge home and a course of furosemide and have the patient follow-up with his cardiologist in [place].  Patient agreeable to plan of care.  Patient's repeat troponin is reassuring.  Chest x-ray shows mild CHF.  We will discharge patient on furosemide have the patient follow-up with his cardiologist tomorrow.  Patient agreeable to plan of care.  Discussed my normal chest pain return precautions.  ____________________________________________   FINAL CLINICAL IMPRESSION(S) / ED DIAGNOSES  Dyspnea    Minna Antis, MD 05/28/18 1427

## 2018-05-28 NOTE — ED Triage Notes (Signed)
SOB began 8 am this am. States was trying to rest at time. Had eaten breakfast.

## 2018-05-28 NOTE — Discharge Instructions (Addendum)
Please take your furosemide/Lasix as prescribed each morning for the next 10 days.  Please call your cardiologist to arrange a follow-up appointment within the next several days for recheck/reevaluation.  Return to the emergency department for any worsening shortness of breath, any chest pain, or any other symptom personally concerning to yourself.

## 2018-06-19 ENCOUNTER — Other Ambulatory Visit: Payer: Self-pay | Admitting: Cardiovascular Disease

## 2018-06-27 ENCOUNTER — Emergency Department
Admission: EM | Admit: 2018-06-27 | Discharge: 2018-06-27 | Disposition: A | Discharge status: Home / Self Care | Payer: Medicare Other | Attending: Emergency Medicine | Admitting: Emergency Medicine

## 2018-06-27 ENCOUNTER — Emergency Department: Payer: Medicare Other

## 2018-06-27 ENCOUNTER — Other Ambulatory Visit: Payer: Self-pay

## 2018-06-27 DIAGNOSIS — Z79899 Other long term (current) drug therapy: Secondary | ICD-10-CM | POA: Insufficient documentation

## 2018-06-27 DIAGNOSIS — I2510 Atherosclerotic heart disease of native coronary artery without angina pectoris: Secondary | ICD-10-CM | POA: Diagnosis not present

## 2018-06-27 DIAGNOSIS — I5042 Chronic combined systolic (congestive) and diastolic (congestive) heart failure: Secondary | ICD-10-CM | POA: Insufficient documentation

## 2018-06-27 DIAGNOSIS — I110 Hypertensive heart disease with heart failure: Secondary | ICD-10-CM | POA: Insufficient documentation

## 2018-06-27 DIAGNOSIS — R0602 Shortness of breath: Secondary | ICD-10-CM | POA: Diagnosis not present

## 2018-06-27 DIAGNOSIS — F17210 Nicotine dependence, cigarettes, uncomplicated: Secondary | ICD-10-CM | POA: Diagnosis not present

## 2018-06-27 DIAGNOSIS — Z7982 Long term (current) use of aspirin: Secondary | ICD-10-CM | POA: Insufficient documentation

## 2018-06-27 DIAGNOSIS — I509 Heart failure, unspecified: Secondary | ICD-10-CM

## 2018-06-27 LAB — CBC WITH DIFFERENTIAL/PLATELET
Abs Immature Granulocytes: 0.11 10*3/uL — ABNORMAL HIGH (ref 0.00–0.07)
Basophils Absolute: 0.1 10*3/uL (ref 0.0–0.1)
Basophils Relative: 1 %
Eosinophils Absolute: 0.3 10*3/uL (ref 0.0–0.5)
Eosinophils Relative: 3 %
HCT: 42 % (ref 39.0–52.0)
Hemoglobin: 14.1 g/dL (ref 13.0–17.0)
Immature Granulocytes: 1 %
Lymphocytes Relative: 17 %
Lymphs Abs: 1.6 10*3/uL (ref 0.7–4.0)
MCH: 28.5 pg (ref 26.0–34.0)
MCHC: 33.6 g/dL (ref 30.0–36.0)
MCV: 85 fL (ref 80.0–100.0)
Monocytes Absolute: 0.5 10*3/uL (ref 0.1–1.0)
Monocytes Relative: 5 %
Neutro Abs: 6.5 10*3/uL (ref 1.7–7.7)
Neutrophils Relative %: 73 %
Platelets: 238 10*3/uL (ref 150–400)
RBC: 4.94 MIL/uL (ref 4.22–5.81)
RDW: 14.6 % (ref 11.5–15.5)
WBC: 9 10*3/uL (ref 4.0–10.5)
nRBC: 0 % (ref 0.0–0.2)

## 2018-06-27 LAB — BASIC METABOLIC PANEL
Anion gap: 12 (ref 5–15)
BUN: 11 mg/dL (ref 6–20)
CO2: 23 mmol/L (ref 22–32)
Calcium: 9.1 mg/dL (ref 8.9–10.3)
Chloride: 108 mmol/L (ref 98–111)
Creatinine, Ser: 1.08 mg/dL (ref 0.61–1.24)
GFR calc Af Amer: >60 mL/min (ref >60)
GFR calc non Af Amer: >60 mL/min (ref >60)
Glucose, Bld: 151 mg/dL — ABNORMAL HIGH (ref 70–99)
Potassium: 3.8 mmol/L (ref 3.5–5.1)
Sodium: 143 mmol/L (ref 135–145)

## 2018-06-27 LAB — TROPONIN I
Troponin I: 0.03 ng/mL (ref <0.03)
Troponin I: 0.03 ng/mL (ref <0.03)

## 2018-06-27 LAB — BRAIN NATRIURETIC PEPTIDE: B Natriuretic Peptide: 222 pg/mL — ABNORMAL HIGH (ref 0.0–100.0)

## 2018-06-27 MED ORDER — IPRATROPIUM-ALBUTEROL 0.5-2.5 (3) MG/3ML IN SOLN
3.0000 mL | Freq: Once | RESPIRATORY_TRACT | Status: AC
  Administered 2018-06-27: 3 mL via RESPIRATORY_TRACT
  Filled 2018-06-27: qty 3

## 2018-06-27 MED ORDER — FUROSEMIDE 10 MG/ML IJ SOLN
80.0000 mg | Freq: Once | INTRAMUSCULAR | Status: AC
  Administered 2018-06-27: 80 mg via INTRAVENOUS
  Filled 2018-06-27: qty 8

## 2018-06-27 MED ORDER — FUROSEMIDE 40 MG PO TABS: 40.0000 mg | ORAL_TABLET | Freq: Every day | ORAL | 0 refills | Status: DC

## 2018-06-27 NOTE — Discharge Instructions (Signed)
Take the Lasix as prescribed over the next 2 weeks.  Make an appointment to follow-up with your cardiologist as soon as possible.  Return to the ER immediately for new, worsening, persistent severe shortness of breath, chest pain, weakness or lightheadedness, leg swelling, or any other new or worsening symptoms that concern you.

## 2018-06-27 NOTE — ED Notes (Signed)
After breathing treatment pt sats at 98% RA

## 2018-06-27 NOTE — ED Triage Notes (Signed)
Pt to ED via ems. Pt c/o shortness of breath, pt states he was driving to work this am when he became short of breath. Pt has hx of CHF. Per ems pt had diminished breath sounds in all fields and was placed on cpap. Pt was given 1 albuterol and 1 duoneb by ems. Pt sats at 100% on cpap.

## 2018-06-27 NOTE — ED Provider Notes (Signed)
[facility] Emergency Department Provider Note ____________________________________________   First MD Initiated Contact with Patient 06/27/18 [phone]     (approximate)  I have reviewed the triage vital signs and the nursing notes.   HISTORY  Chief Complaint Shortness of Breath    HPI Peter Russo is a 42 y.o. male with PMH as noted below including CAD and CHF who presents with shortness of breath, acute onset approximately one hour prior to arrival, now improved after CPAP and nebs by EMS.  He denies associated chest pain, recent cough, fever, or leg swelling.  He states that he felt fine until this morning.    Past Medical History:  Diagnosis Date  . Anxiety   . CAD S/P percutaneous coronary angioplasty    a. s/p BMS-mLAD 2010. b. DES to RCA 2012. c. 04/2015: DES to LCx and PCI to LAD c/b dissection with subsequent overlapping DES to mLAD - r/i for NSTEMI afterwards; d. 08/2015 Ant STEMI in setting of noncompliance->187mLAD ISR (3.0x16 Synergy DES); e. 03/2016 PCI of apical LAD; f. 03/2016 Relook Cath: patent LAD stents, RCA 100p CTO-->Med Rx; g. 10/2016 Ant STEMI: PTCA 100p LAD.  Marland Kitchen Chronic combined systolic and diastolic CHF (congestive heart failure) (HCC)    a. 03/2016 Echo: EF 30-35%, Gr2 DD;  b. 08/2016 Echo: EF 40%.  . Depression   . Essential hypertension   . GAD (generalized anxiety disorder)   . GERD (gastroesophageal reflux disease)   . Hypercholesteremia   . Ischemic cardiomyopathy    a. prior EF 25-35 percent at cath, 35-40 percent by echo; b. 03/2016 Echo: EF 30-35%, diff HK, Gr2 DD; c. 08/2016 Echo: EF 40%, base/mid inferior/inferowseptal AK, mildly dil LA.  . Morbid obesity (HCC)   . Pre-diabetes   . SVT (supraventricular tachycardia) (HCC) APRROX 10 YRS AGO   PRESUMED AVNRT, ECHO 12/07 WITH EF 65%, MILD LAE  . Tobacco abuse    a. 20 + pack years, quit 10/2016.    Patient Active Problem List   Diagnosis Date Noted  . Stable angina (HCC)     . Elevated troponin   . Mixed hyperlipidemia   . Chest pain 11/17/2016  . CAD (coronary artery disease) 11/12/2016  . AKI (acute kidney injury) (HCC) 11/11/2016  . History of acute anterior wall MI 10/19/2016  . Chronic combined systolic and diastolic CHF (congestive heart failure) (HCC)   . Ischemic cardiomyopathy   . H/O medication noncompliance   . Ischemic chest pain (HCC)   . Morbid obesity (HCC)   . Pre-diabetes   . Tobacco abuse   . CAD S/P percutaneous coronary angioplasty   . Essential hypertension   . Cellulitis of hand 05/27/2015  . 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 05/26/2015  . NSTEMI (non-ST elevated myocardial infarction) (HCC) 04/24/2015  . Depression 12/26/2014  . GAD (generalized anxiety disorder) 12/26/2014  . GERD (gastroesophageal reflux disease) 03/01/2013  . Dyslipidemia 03/14/2011    Past Surgical History:  Procedure Laterality Date  . CARDIAC CATHETERIZATION N/A 04/23/2015   Procedure: Left Heart Cath and Coronary Angiography;  Surgeon: Wendall Stade, MD;  Location: [facility] INVASIVE CV LAB;  Service: Cardiovascular;  Laterality: N/A;  . CARDIAC CATHETERIZATION N/A 04/23/2015   Procedure: Coronary Stent Intervention;  Surgeon: Tonny Bollman, MD;  Location: [facility] INVASIVE CV LAB;  Service: Cardiovascular;  Laterality: N/A;  . CARDIAC CATHETERIZATION N/A 09/14/2015   Procedure: Left Heart Cath and Coronary Angiography;  Surgeon: Lyn Records, MD;  LAD  100%, CFX 10% ISR, RCA 100% (chronic), EF 25-35%  . CARDIAC CATHETERIZATION N/A 09/14/2015   Procedure: Coronary Stent Intervention;  Surgeon: Lyn Records, MD;  Location: [facility] INVASIVE CV LAB;  Service: Cardiovascular;  Laterality: N/A; 3.0 x 16 mm Synergy DES LAD  . CARDIAC CATHETERIZATION  03/16/2016  . CARDIAC CATHETERIZATION N/A 03/16/2016   Procedure: Left Heart Cath and Coronary Angiography;  Surgeon: Lyn Records, MD;  Location: [facility] INVASIVE CV LAB;  Service: Cardiovascular;  Laterality: N/A;  .  CARDIAC CATHETERIZATION N/A 03/16/2016   Procedure: Coronary Balloon Angioplasty;  Surgeon: Lyn Records, MD;  Location: [facility] INVASIVE CV LAB;  Service: Cardiovascular;  Laterality: N/A;  . CARDIAC CATHETERIZATION N/A 03/25/2016   Procedure: Left Heart Cath and Coronary Angiography;  Surgeon: Peter M Swaziland, MD;  Location: [facility] INVASIVE CV LAB;  Service: Cardiovascular;  Laterality: N/A;  . CORONARY ANGIOPLASTY WITH STENT PLACEMENT  2010; 2013  . CORONARY BALLOON ANGIOPLASTY N/A 10/19/2016   Procedure: Coronary Balloon Angioplasty;  Surgeon: Corky Crafts, MD;  Location: [facility] INVASIVE CV LAB;  Service: Cardiovascular;  Laterality: N/A;  . INTRAVASCULAR ULTRASOUND/IVUS N/A 10/19/2016   Procedure: Intravascular Ultrasound/IVUS;  Surgeon: Corky Crafts, MD;  Location: [facility] INVASIVE CV LAB;  Service: Cardiovascular;  Laterality: N/A;  . LEFT HEART CATH AND CORONARY ANGIOGRAPHY N/A 10/19/2016   Procedure: Left Heart Cath and Coronary Angiography;  Surgeon: Corky Crafts, MD;  Location: [facility] INVASIVE CV LAB;  Service: Cardiovascular;  Laterality: N/A;    Prior to Admission medications   Medication Sig Start Date End Date Taking? Authorizing Provider  aspirin 81 MG EC tablet Take 1 tablet (81 mg total) by mouth daily. 10/21/16  Yes Duke, Roe Rutherford, PA  atorvastatin (LIPITOR) 80 MG tablet TAKE ONE TABLET BY MOUTH ONCE DAILY AT 6 PM Patient taking differently: Take 80 mg by mouth daily at 6 PM.  10/13/17  Yes Wendall Stade, MD  buPROPion (WELLBUTRIN SR) 150 MG 12 hr tablet TAKE ONE TABLET BY MOUTH TWICE DAILY Patient taking differently: Take 150 mg by mouth 2 (two) times daily.  10/13/17  Yes Wendall Stade, MD  carvedilol (COREG) 12.5 MG tablet Take 1 tablet (12.5 mg total) by mouth 2 (two) times daily. 01/29/18 06/27/18 Yes Wendall Stade, MD  fenofibrate 160 MG tablet Take 1 tablet (160 mg total) by mouth daily. 01/29/18  Yes Wendall Stade, MD  isosorbide mononitrate (IMDUR) 30 MG 24 hr tablet TAKE  ONE TABLET BY MOUTH ONCE DAILY Patient taking differently: Take 30 mg by mouth daily.  10/13/17  Yes Wendall Stade, MD  losartan (COZAAR) 50 MG tablet Take 1 tablet (50 mg total) by mouth daily. 09/04/17  Yes Wendall Stade, MD  nitroGLYCERIN (NITROSTAT) 0.4 MG SL tablet Place 1 tablet (0.4 mg total) under the tongue every 5 (five) minutes as needed for chest pain (3 x). 01/29/18  Yes Wendall Stade, MD  spironolactone (ALDACTONE) 25 MG tablet Take 1 tablet by mouth daily as needed for edema and shortness of breath. Please make yearly appt with Dr. Eden Emms for January. 1st attempt 06/19/18  Yes Wendall Stade, MD  ticagrelor (BRILINTA) 90 MG TABS tablet Take 1 tablet (90 mg total) by mouth 2 (two) times daily. 01/29/18  Yes Wendall Stade, MD  acetaminophen (TYLENOL) 325 MG tablet Take 2 tablets (650 mg total) by mouth every 4 (four) hours as needed for headache or mild pain. Patient not taking: Reported on 06/27/2018 11/13/16  Corine ShelterKilroy, Luke K, PA-C  furosemide (LASIX) 40 MG tablet Take 1 tablet (40 mg total) by mouth daily for 15 days. 06/27/18 07/12/18  Dionne BucySiadecki, Majesty Stehlin, MD    Allergies Patient has no known allergies.  Family History  Problem Relation Age of Onset  . Arrhythmia Mother        MOTHER LIVED TO BE 89  . Coronary artery disease Mother   . Heart attack Mother   . Hypertension Mother   . Coronary artery disease Father [id number]  . Heart disease Father   . Heart attack Father   . Heart attack Brother   . Stroke Neg Hx     Social History Social History   Tobacco Use  . Smoking status: Current Some Day Smoker    Packs/day: 1.00    Years: 24.00    Pack years: 24.00    Last attempt to quit: 11/03/2016    Years since quitting: 1.6  . Smokeless tobacco: Never Used  . Tobacco comment: quit on 03/15/16  Substance Use Topics  . Alcohol use: No  . Drug use: Yes    Types: Marijuana    Comment: last time 02/2016    Review of Systems  Constitutional: No fever. Eyes: No  redness. ENT: No neck pain. Cardiovascular: Denies chest pain. Respiratory: Positive for shortness of breath. Gastrointestinal: No vomiting. Genitourinary: Negative for flank pain.  Musculoskeletal: Negative for back pain. Skin: Negative for rash. Neurological: Negative for headache.   ____________________________________________   PHYSICAL EXAM:  VITAL SIGNS: ED Triage Vitals  Enc Vitals Group     BP 06/27/18 0831 (!) 162/96     Pulse Rate 06/27/18 0831 (!) 101     Resp 06/27/18 0834 (!) 29     Temp 06/27/18 0834 97.6 F (36.4 C)     Temp Source 06/27/18 0834 Oral     SpO2 06/27/18 0831 100 %     Weight 06/27/18 0832 263 lb (119.3 kg)     Height 06/27/18 0832 5\' 7"  (1.702 m)     Head Circumference --      Peak Flow --      Pain Score 06/27/18 0832 0     Pain Loc --      Pain Edu? --      Excl. in GC? --     Constitutional: Alert and oriented.  Slightly uncomfortable appearing but in no acute distress. Eyes: Conjunctivae are normal.  Head: Atraumatic. Nose: No congestion/rhinnorhea. Mouth/Throat: Mucous membranes are moist.   Neck: Normal range of motion.  Cardiovascular: Borderline tachycardic, regular rhythm. Grossly normal heart sounds.  Good peripheral circulation. Respiratory: Somewhat increased respiratory effort.  No retractions.  Coarse breath sounds bilaterally.  No wheezes. Gastrointestinal: No distention.  Musculoskeletal: No lower extremity edema.  Extremities warm and well perfused.  Neurologic:  Normal speech and language. No gross focal neurologic deficits are appreciated.  Skin:  Skin is warm and dry. No rash noted. Psychiatric: Mood and affect are normal. Speech and behavior are normal.  ____________________________________________   LABS (all labs ordered are listed, but only abnormal results are displayed)  Labs Reviewed  BASIC METABOLIC PANEL - Abnormal; Notable for the following components:      Result Value   Glucose, Bld 151 (*)    All  other components within normal limits  CBC WITH DIFFERENTIAL/PLATELET - Abnormal; Notable for the following components:   Abs Immature Granulocytes 0.11 (*)    All other components within normal limits  TROPONIN I - Abnormal;  Notable for the following components:   Troponin I 0.03 (*)    All other components within normal limits  BRAIN NATRIURETIC PEPTIDE - Abnormal; Notable for the following components:   B Natriuretic Peptide 222.0 (*)    All other components within normal limits  TROPONIN I - Abnormal; Notable for the following components:   Troponin I 0.03 (*)    All other components within normal limits   ____________________________________________  EKG  ED ECG REPORT I, Dionne Bucy, the attending physician, personally viewed and interpreted this ECG.  Date: 06/27/2018 EKG Time: 0835 Rate: 100 Rhythm: normal sinus rhythm QRS Axis: normal Intervals: normal ST/T Wave abnormalities: Nonspecific T wave flattening laterally Narrative Interpretation: no evidence of acute ischemia  ED ECG REPORT I, Dionne Bucy, the attending physician, personally viewed and interpreted this ECG.  Date: 06/27/2018 EKG Time: 1157 Rate: 82 Rhythm: normal sinus rhythm QRS Axis: normal Intervals: normal ST/T Wave abnormalities: Nonspecific lateral T wave abnormalities Narrative Interpretation: No dynamic changes when compared to EKG of 0835 today  ____________________________________________  RADIOLOGY  CXR: Cardiomegaly with pulmonary edema  ____________________________________________   PROCEDURES  Procedure(s) performed: No  Procedures  Critical Care performed: No ____________________________________________   INITIAL IMPRESSION / ASSESSMENT AND PLAN / ED COURSE  Pertinent labs & imaging results that were available during my care of the patient were reviewed by me and considered in my medical decision making (see chart for details).  42 year old male with  PMH as noted above presents with acute onset of shortness of breath this morning.  The patient was given albuterol nebs and CPAP by EMS with improvement.  On ED arrival his O2 saturation was in the high 90s and he had slightly increased respiratory effort but no acute distress.  His exam revealed coarse breath sounds bilaterally, and no significant peripheral edema.  I reviewed the past medical records in Epic; the patient was last seen in the ED about 1 month ago with likely mild CHF and was placed on a course of furosemide.  Differential for this presentation is acute CHF, bronchitis, pneumonia, less likely new onset COPD.  I do not suspect PE given the fact that the patient is not hypoxic and has no chest pain or leg swelling.  We will obtain labs, x-ray, give additional nebs, and reassess.  ----------------------------------------- 12:48 PM on 06/27/2018 -----------------------------------------  Chest x-ray was consistent with acute CHF.  The patient's BNP is only mildly elevated.  His troponin was minimally elevated and he has had similar troponin in the past.  I ordered IV Lasix.  The patient continued to improve and has had good urine output.  He is now resting comfortably and has no increased respiratory effort even with exertion.  O2 saturation is 100% on room air.  We obtained a repeat troponin after 3 hours which was unchanged.  There is no evidence of acute ischemia.  The patient had a brief episode of chest pain although he actually described to me as an epigastric area cramp when he tried to move in the bed.  It was very brief and has resolved.  The patient's presentation is consistent with acute CHF.  However given his normal oxygenation, resolved respiratory symptoms, otherwise normal vital signs, and reassuring lab work-up, there is no indication for admission at this time.  The patient himself would strongly prefer to go home.  I will prescribe another course of a higher dose  of Lasix for 2 weeks.  The patient's creatinine and potassium are normal.  However I strongly emphasized to the patient the importance of following up with his cardiologist in [place] soon as possible, as he will require further outpatient management to prevent recurrent CHF exacerbations.  The patient agrees with this plan.  Return precautions given, and he expresses understanding. ____________________________________________   FINAL CLINICAL IMPRESSION(S) / ED DIAGNOSES  Final diagnoses:  Acute congestive heart failure, unspecified heart failure type (HCC)      NEW MEDICATIONS STARTED DURING THIS VISIT:  New Prescriptions   FUROSEMIDE (LASIX) 40 MG TABLET    Take 1 tablet (40 mg total) by mouth daily for 15 days.     Note:  This document was prepared using Dragon voice recognition software and may include unintentional dictation errors.    Dionne Bucy, MD 06/27/18 1250

## 2018-06-27 NOTE — ED Notes (Signed)
Date and time results received: 06/27/18 9:24 AM  (use smartphrase ".now" to insert current time)  Test: troponin  Critical Value: 0.03  Name of Provider Notified: siadecki  Orders Received? Or Actions Taken?: MD aware

## 2018-06-27 NOTE — ED Notes (Signed)
PT complaining of new chest pain, RN took EKG and had Dr. Don PerkingVeronese verify no abnormalities. Dr. Marisa SeverinSiadecki made aware of EKG and chest pain along with stable vitals.

## 2018-06-27 NOTE — ED Notes (Signed)
Pt ambulatory to restroom

## 2018-07-02 ENCOUNTER — Ambulatory Visit: Payer: Medicare Other | Admitting: Nurse Practitioner

## 2018-07-02 ENCOUNTER — Ambulatory Visit (INDEPENDENT_AMBULATORY_CARE_PROVIDER_SITE_OTHER): Payer: Medicare Other | Admitting: Nurse Practitioner

## 2018-07-02 ENCOUNTER — Encounter: Payer: Self-pay | Admitting: Nurse Practitioner

## 2018-07-02 VITALS — BP 120/80 | HR 86 | Ht 67.0 in | Wt 279.4 lb

## 2018-07-02 DIAGNOSIS — I5042 Chronic combined systolic (congestive) and diastolic (congestive) heart failure: Secondary | ICD-10-CM | POA: Diagnosis not present

## 2018-07-02 DIAGNOSIS — Z79899 Other long term (current) drug therapy: Secondary | ICD-10-CM

## 2018-07-02 DIAGNOSIS — I255 Ischemic cardiomyopathy: Secondary | ICD-10-CM

## 2018-07-02 DIAGNOSIS — Z8679 Personal history of other diseases of the circulatory system: Secondary | ICD-10-CM

## 2018-07-02 LAB — LIPID PANEL
Chol/HDL Ratio: 5.5 ratio — ABNORMAL HIGH (ref 0.0–5.0)
Cholesterol, Total: 241 mg/dL — ABNORMAL HIGH (ref 100–199)
HDL: 44 mg/dL (ref >39)
LDL Calculated: 164 mg/dL — ABNORMAL HIGH (ref 0–99)
Triglycerides: 164 mg/dL — ABNORMAL HIGH (ref 0–149)
VLDL Cholesterol Cal: 33 mg/dL (ref 5–40)

## 2018-07-02 LAB — HEPATIC FUNCTION PANEL
ALT: 22 IU/L (ref 0–44)
AST: 18 IU/L (ref 0–40)
Albumin: 4.5 g/dL (ref 3.5–5.5)
Alkaline Phosphatase: 91 IU/L (ref 39–117)
Bilirubin Total: 0.3 mg/dL (ref 0.0–1.2)
Bilirubin, Direct: 0.11 mg/dL (ref 0.00–0.40)
Total Protein: 7.7 g/dL (ref 6.0–8.5)

## 2018-07-02 LAB — CBC
Hematocrit: 44.5 % (ref 37.5–51.0)
Hemoglobin: 15.3 g/dL (ref 13.0–17.7)
MCH: 29.1 pg (ref 26.6–33.0)
MCHC: 34.4 g/dL (ref 31.5–35.7)
MCV: 85 fL (ref 79–97)
Platelets: 270 10*3/uL (ref 150–450)
RBC: 5.26 x10E6/uL (ref 4.14–5.80)
RDW: 14.7 % (ref 12.3–15.4)
WBC: 8.9 10*3/uL (ref 3.4–10.8)

## 2018-07-02 LAB — BASIC METABOLIC PANEL
BUN/Creatinine Ratio: 11 (ref 9–20)
BUN: 14 mg/dL (ref 6–24)
CO2: 25 mmol/L (ref 20–29)
Calcium: 9.8 mg/dL (ref 8.7–10.2)
Chloride: 98 mmol/L (ref 96–106)
Creatinine, Ser: 1.23 mg/dL (ref 0.76–1.27)
GFR calc Af Amer: 82 mL/min/{1.73_m2} (ref >59)
GFR calc non Af Amer: 71 mL/min/{1.73_m2} (ref >59)
Glucose: 160 mg/dL — ABNORMAL HIGH (ref 65–99)
Potassium: 4.2 mmol/L (ref 3.5–5.2)
Sodium: 140 mmol/L (ref 134–144)

## 2018-07-02 LAB — TSH: TSH: 1.44 u[IU]/mL (ref 0.450–4.500)

## 2018-07-02 LAB — PRO B NATRIURETIC PEPTIDE: NT-Pro BNP: 285 pg/mL — ABNORMAL HIGH (ref 0–86)

## 2018-07-02 MED ORDER — CARVEDILOL 12.5 MG PO TABS: 12.5000 mg | ORAL_TABLET | Freq: Two times a day (BID) | ORAL | 3 refills | Status: DC

## 2018-07-02 MED ORDER — LOSARTAN POTASSIUM 50 MG PO TABS: 50.0000 mg | ORAL_TABLET | Freq: Every day | ORAL | 3 refills | Status: DC

## 2018-07-02 MED ORDER — FENOFIBRATE 160 MG PO TABS: 160.0000 mg | ORAL_TABLET | Freq: Every day | ORAL | 3 refills | Status: DC

## 2018-07-02 MED ORDER — TICAGRELOR 90 MG PO TABS: 90.0000 mg | ORAL_TABLET | Freq: Two times a day (BID) | ORAL | 3 refills | Status: DC

## 2018-07-02 MED ORDER — FUROSEMIDE 40 MG PO TABS: 40.0000 mg | ORAL_TABLET | Freq: Every day | ORAL | 3 refills | Status: DC

## 2018-07-02 MED ORDER — SPIRONOLACTONE 25 MG PO TABS: ORAL_TABLET | ORAL | 3 refills | Status: DC

## 2018-07-02 NOTE — Addendum Note (Signed)
Addended by: Rosalio MacadamiaGERHARDT, Hajar Penninger C on: 07/02/2018 10:13 AM   Modules accepted: Orders

## 2018-07-02 NOTE — Patient Instructions (Addendum)
We will be checking the following labs today - BMET, CBC, HPF, Lipids, BNP and TSH    Medication Instructions:    Continue with your current medicines. BUT  I have sent in multiple medicines today for refill - go by the list you are given today and to match up with.   I am leaving you on the Lasix/Furosemide   If you need a refill on your cardiac medications before your next appointment, please call your pharmacy.     Testing/Procedures To Be Arranged:  Echocardiogram  Follow-Up:   See Dr. Eden EmmsNishan in about 2 months    At [facility], you and your health needs are our priority.  As part of our continuing mission to provide you with exceptional heart care, we have created designated Provider Care Teams.  These Care Teams include your primary Cardiologist (physician) and Advanced Practice Providers (APPs -  Physician Assistants and Nurse Practitioners) who all work together to provide you with the care you need, when you need it.  Special Instructions:  . Salt restriction is imperative . Try to get a set of scales and weigh daily   Low-Sodium Eating Plan Sodium, which is an element that makes up salt, helps you maintain a healthy balance of fluids in your body. Too much sodium can increase your blood pressure and cause fluid and waste to be held in your body. Your health care provider or dietitian may recommend following this plan if you have high blood pressure (hypertension), kidney disease, liver disease, or heart failure. Eating less sodium can help lower your blood pressure, reduce swelling, and protect your heart, liver, and kidneys. What are tips for following this plan? General guidelines  Most people on this plan should limit their sodium intake to 1,500-2,000 mg (milligrams) of sodium each day. Reading food labels  The Nutrition Facts label lists the amount of sodium in one serving of the food. If you eat more than one serving, you must multiply the listed amount  of sodium by the number of servings.  Choose foods with less than 140 mg of sodium per serving.  Avoid foods with 300 mg of sodium or more per serving. Shopping  Look for lower-sodium products, often labeled as "low-sodium" or "no salt added."  Always check the sodium content even if foods are labeled as "unsalted" or "no salt added".  Buy fresh foods. ? Avoid canned foods and premade or frozen meals. ? Avoid canned, cured, or processed meats  Buy breads that have less than 80 mg of sodium per slice. Cooking  Eat more home-cooked food and less restaurant, buffet, and fast food.  Avoid adding salt when cooking. Use salt-free seasonings or herbs instead of table salt or sea salt. Check with your health care provider or pharmacist before using salt substitutes.  Cook with plant-based oils, such as canola, sunflower, or olive oil. Meal planning  When eating at a restaurant, ask that your food be prepared with less salt or no salt, if possible.  Avoid foods that contain MSG (monosodium glutamate). MSG is sometimes added to Cong[place]inese food, bouillon, and some canned foods. What foods are recommended? The items listed may not be a complete list. Talk with your dietitian about what dietary choices are best for you. Grains Low-sodium cereals, including oats, puffed wheat and rice, and shredded wheat. Low-sodium crackers. Unsalted rice. Unsalted pasta. Low-sodium bread. Whole-grain breads and whole-grain pasta. Vegetables Fresh or frozen vegetables. "No salt added" canned vegetables. "No salt added" tomato sauce  and paste. Low-sodium or reduced-sodium tomato and vegetable juice. Fruits Fresh, frozen, or canned fruit. Fruit juice. Meats and other protein foods Fresh or frozen (no salt added) meat, poultry, seafood, and fish. Low-sodium canned tuna and salmon. Unsalted nuts. Dried peas, beans, and lentils without added salt. Unsalted canned beans. Eggs. Unsalted nut butters. Dairy Milk. Soy  milk. Cheese that is naturally low in sodium, such as ricotta cheese, fresh mozzarella, or Swiss cheese Low-sodium or reduced-sodium cheese. Cream cheese. Yogurt. Fats and oils Unsalted butter. Unsalted margarine with no trans fat. Vegetable oils such as canola or olive oils. Seasonings and other foods Fresh and dried herbs and spices. Salt-free seasonings. Low-sodium mustard and ketchup. Sodium-free salad dressing. Sodium-free light mayonnaise. Fresh or refrigerated horseradish. Lemon juice. Vinegar. Homemade, reduced-sodium, or low-sodium soups. Unsalted popcorn and pretzels. Low-salt or salt-free chips. What foods are not recommended? The items listed may not be a complete list. Talk with your dietitian about what dietary choices are best for you. Grains Instant hot cereals. Bread stuffing, pancake, and biscuit mixes. Croutons. Seasoned rice or pasta mixes. Noodle soup cups. Boxed or frozen macaroni and cheese. Regular salted crackers. Self-rising flour. Vegetables Sauerkraut, pickled vegetables, and relishes. Olives. [place] fries. Onion rings. Regular canned vegetables (not low-sodium or reduced-sodium). Regular canned tomato sauce and paste (not low-sodium or reduced-sodium). Regular tomato and vegetable juice (not low-sodium or reduced-sodium). Frozen vegetables in sauces. Meats and other protein foods Meat or fish that is salted, canned, smoked, spiced, or pickled. Bacon, ham, sausage, hotdogs, corned beef, chipped beef, packaged lunch meats, salt pork, jerky, pickled herring, anchovies, regular canned tuna, sardines, salted nuts. Dairy Processed cheese and cheese spreads. Cheese curds. Blue cheese. Feta cheese. String cheese. Regular cottage cheese. Buttermilk. Canned milk. Fats and oils Salted butter. Regular margarine. Ghee. Bacon fat. Seasonings and other foods Onion salt, garlic salt, seasoned salt, table salt, and sea salt. Canned and packaged gravies. Worcestershire sauce. Tartar  sauce. Barbecue sauce. Teriyaki sauce. Soy sauce, including reduced-sodium. Steak sauce. Fish sauce. Oyster sauce. Cocktail sauce. Horseradish that you find on the shelf. Regular ketchup and mustard. Meat flavorings and tenderizers. Bouillon cubes. Hot sauce and Tabasco sauce. Premade or packaged marinades. Premade or packaged taco seasonings. Relishes. Regular salad dressings. Salsa. Potato and tortilla chips. Corn chips and puffs. Salted popcorn and pretzels. Canned or dried soups. Pizza. Frozen entrees and pot pies. Summary  Eating less sodium can help lower your blood pressure, reduce swelling, and protect your heart, liver, and kidneys.  Most people on this plan should limit their sodium intake to 1,500-2,000 mg (milligrams) of sodium each day.  Canned, boxed, and frozen foods are high in sodium. Restaurant foods, fast foods, and pizza are also very high in sodium. You also get sodium by adding salt to food.  Try to cook at home, eat more fresh fruits and vegetables, and eat less fast food, canned, processed, or prepared foods. This information is not intended to replace advice given to you by your health care provider. Make sure you discuss any questions you have with your health care provider. Document Released: 01/21/2002 Document Revised: 07/25/2016 Document Reviewed: 07/25/2016 Elsevier Interactive Patient Education  2018 [employer].   Call the [facility] office at [phone] if you have any questions, problems or concerns.

## 2018-07-02 NOTE — Progress Notes (Signed)
CARDIOLOGY OFFICE NOTE  Date:  07/02/2018    [name] Date of Birth: [birth year] Medical Record #[id number]  PCP:  Hoy Register, MD  Cardiologist:  Eden Emms    Chief Complaint  Patient presents with  . Follow-up    Post ER visit - seen for Dr. Eden Emms    History of Present Illness: Peter Russo is a 42 y.o. male who presents today for a post ER visit. Seen for Dr. Eden Emms.   He has a history of CAD s/p multiple previous PCIs to LAD and LCx and known CTO to RCA, ICM, HTN, HLD, obesity, GERD and pre diabetes.   He has a long hx of CAD with previous stenting of the LAD that was complicated by in stent restenosis with anterior STEMI in 1/07 requiring repeat intervention.He represented with chest pain in 03/16/2016 and underwent cath with intervention to the apical LAD. He then presented back 03/24/16 with chest pain and peak troponin of 3.38. Underwent cardiac cath showing patent stents with no new culprits for his reported symptoms. Echo at that time showed EF of 30-35% with diffuse hypokinesis and G2DD. Was maintained on ASA, statin, BB, ACEi, and Brilinta, with Imdur/spironolactone. He was on Entresto for a short period. Saw EP for consideration of ICD but EF had improved to 40%.   He was admitted to [facility] 3/7-10/21/16 for anterior STEMI. On arrival, he was taken emergently to the cath lab. Culprit lesion, proximal LAD, was 100% stenosed. Balloon angioplasty alone was performed on the proximal LAD; no stents were placed given - there was concern for questionable medication compliance. He was also noted to have CTO of the RCA, 10% prox-mid Cx and 45% 2nd diagonal. Decision was made to treat him medically. He was continued on ASA/Brilitna, lipitor, coreg, losartan and imdur. Per discharge summary at that time - his Entresto and spironolactone were discontinued.   Chart notes issues with dyspnea and that his Brilinta had been stopped - but he is taking.   In the ER back in  October and November - both for shortness of breath.   Comes in today. Here with his fiance'. He does not know any of his medicines but is here to "see what I am suppose to be on".  Says he has been to the ER twice due to shortness of breath - says that is better now. He had bloating of his abdomen as well - no real peripheral edema. He is on Lasix. He brought in his medicines that he says he is taking. Does not correlate with our list. Tells me he is taking his Aldactone every day - not "prn". He has a bottle of Brilinta. Not on fenofibrate - unclear why. He denies chest pain. Smoking "some". No real exercise.   Past Medical History:  Diagnosis Date  . Anxiety   . CAD S/P percutaneous coronary angioplasty    a. s/p BMS-mLAD 2010. b. DES to RCA 2012. c. 04/2015: DES to LCx and PCI to LAD c/b dissection with subsequent overlapping DES to mLAD - r/i for NSTEMI afterwards; d. 08/2015 Ant STEMI in setting of noncompliance->130mLAD ISR (3.0x16 Synergy DES); e. 03/2016 PCI of apical LAD; f. 03/2016 Relook Cath: patent LAD stents, RCA 100p CTO-->Med Rx; g. 10/2016 Ant STEMI: PTCA 100p LAD.  Marland Kitchen Chronic combined systolic and diastolic CHF (congestive heart failure) (HCC)    a. 03/2016 Echo: EF 30-35%, Gr2 DD;  b. 08/2016 Echo: EF 40%.  . Depression   .  Essential hypertension   . GAD (generalized anxiety disorder)   . GERD (gastroesophageal reflux disease)   . Hypercholesteremia   . Ischemic cardiomyopathy    a. prior EF 25-35 percent at cath, 35-40 percent by echo; b. 03/2016 Echo: EF 30-35%, diff HK, Gr2 DD; c. 08/2016 Echo: EF 40%, base/mid inferior/inferowseptal AK, mildly dil LA.  . Morbid obesity (HCC)   . Pre-diabetes   . SVT (supraventricular tachycardia) (HCC) APRROX 10 YRS AGO   PRESUMED AVNRT, ECHO 12/07 WITH EF 65%, MILD LAE  . Tobacco abuse    a. 20 + pack years, quit 10/2016.    Past Surgical History:  Procedure Laterality Date  . CARDIAC CATHETERIZATION N/A 04/23/2015   Procedure: Left Heart  Cath and Coronary Angiography;  Surgeon: Wendall StadePeter C Nishan, MD;  [facility];  Service: Cardiovascular;  Laterality: N/A;  . CARDIAC CATHETERIZATION N/A 04/23/2015   Procedure: Coronary Stent Intervention;  Surgeon: Tonny BollmanMichael Cooper, MD;  [facility];  Service: Cardiovascular;  Laterality: N/A;  . CARDIAC CATHETERIZATION N/A 09/14/2015   Procedure: Left Heart Cath and Coronary Angiography;  Surgeon: Lyn RecordsHenry W Smith, MD;  LAD 100%, CFX 10% ISR, RCA 100% (chronic), EF 25-35%  . CARDIAC CATHETERIZATION N/A 09/14/2015   Procedure: Coronary Stent Intervention;  Surgeon: Lyn RecordsHenry W Smith, MD;  [facility];  Service: Cardiovascular;  Laterality: N/A; 3.0 x 16 mm Synergy DES LAD  . CARDIAC CATHETERIZATION  03/16/2016  . CARDIAC CATHETERIZATION N/A 03/16/2016   Procedure: Left Heart Cath and Coronary Angiography;  Surgeon: Lyn RecordsHenry W Smith, MD;  [facility];  Service: Cardiovascular;  Laterality: N/A;  . CARDIAC CATHETERIZATION N/A 03/16/2016   Procedure: Coronary Balloon Angioplasty;  Surgeon: Lyn RecordsHenry W Smith, MD;  [facility];  Service: Cardiovascular;  Laterality: N/A;  . CARDIAC CATHETERIZATION N/A 03/25/2016   Procedure: Left Heart Cath and Coronary Angiography;  Surgeon: Peter M SwazilandJordan, MD;  [facility];  Service: Cardiovascular;  Laterality: N/A;  . CORONARY ANGIOPLASTY WITH STENT PLACEMENT  2010; 2013  . CORONARY BALLOON ANGIOPLASTY N/A 10/19/2016   Procedure: Coronary Balloon Angioplasty;  Surgeon: Corky CraftsJayadeep S Varanasi, MD;  [facility];  Service: Cardiovascular;  Laterality: N/A;  . INTRAVASCULAR ULTRASOUND/IVUS N/A 10/19/2016   Procedure: Intravascular Ultrasound/IVUS;  Surgeon: Corky CraftsJayadeep S Varanasi, MD;  Location: [facility] CV LAB;  Service: Cardiovascular;  Laterality: N/A;  . LEFT HEART CATH AND CORONARY ANGIOGRAPHY N/A 10/19/2016   Procedure: Left Heart Cath and Coronary Angiography;  Surgeon: Corky CraftsJayadeep  S Varanasi, MD;  [facility];  Service: Cardiovascular;  Laterality: N/A;     Medications: Current Meds  Medication Sig  . acetaminophen (TYLENOL) 325 MG tablet Take 2 tablets (650 mg total) by mouth every 4 (four) hours as needed for headache or mild pain.  Marland Kitchen. aspirin 81 MG EC tablet Take 1 tablet (81 mg total) by mouth daily.  Marland Kitchen. atorvastatin (LIPITOR) 80 MG tablet TAKE ONE TABLET BY MOUTH ONCE DAILY AT 6 PM (Patient taking differently: Take 80 mg by mouth daily at 6 PM. )  . buPROPion (WELLBUTRIN SR) 150 MG 12 hr tablet TAKE ONE TABLET BY MOUTH TWICE DAILY (Patient taking differently: Take 150 mg by mouth 2 (two) times daily. )  . furosemide (LASIX) 40 MG tablet Take 1 tablet (40 mg total) by mouth daily for 15 days.  Marland Kitchen. losartan (COZAAR) 50 MG tablet Take 1 tablet (50 mg total)  by mouth daily.  . nitroGLYCERIN (NITROSTAT) 0.4 MG SL tablet Place 1 tablet (0.4 mg total) under the tongue every 5 (five) minutes as needed for chest pain (3 x).  . spironolactone (ALDACTONE) 25 MG tablet Take 1 tablet by mouth daily  . ticagrelor (BRILINTA) 90 MG TABS tablet Take 1 tablet (90 mg total) by mouth 2 (two) times daily.  . [DISCONTINUED] furosemide (LASIX) 40 MG tablet Take 1 tablet (40 mg total) by mouth daily for 15 days.  . [DISCONTINUED] losartan (COZAAR) 50 MG tablet Take 1 tablet (50 mg total) by mouth daily.  . [DISCONTINUED] spironolactone (ALDACTONE) 25 MG tablet Take 1 tablet by mouth daily as needed for edema and shortness of breath. Please make yearly appt with Dr. Eden Emms for January. 1st attempt (Patient taking differently: Take 1 tablet by mouth daily as needed for edema and shortness of breath.)  . [DISCONTINUED] ticagrelor (BRILINTA) 90 MG TABS tablet Take 1 tablet (90 mg total) by mouth 2 (two) times daily.     Allergies: No Known Allergies  Social History: The patient  reports that he has been smoking. He has a 24.00 pack-year smoking history. He has never used  smokeless tobacco. He reports that he has current or past drug history. Drug: Marijuana. He reports that he does not drink alcohol.   Family History: The patient's family history includes Arrhythmia in his mother; Coronary artery disease in his mother; Coronary artery disease (age of onset: 76) in his father; Heart attack in his brother, father, and mother; Heart disease in his father; Hypertension in his mother.   Review of Systems: Please see the history of present illness.   Otherwise, the review of systems is positive for none.   All other systems are reviewed and negative.   Physical Exam: VS:  BP 120/80 (BP Location: Left Arm, Patient Position: Sitting, Cuff Size: Normal)   Pulse 86   Ht 5\' 7"  (1.702 m)   Wt 279 lb 6.4 oz (126.7 kg)   SpO2 95% Comment: at rest  BMI 43.76 kg/m  .  BMI Body mass index is 43.76 kg/m.  Wt Readings from Last 3 Encounters:  07/02/18 279 lb 6.4 oz (126.7 kg)  06/27/18 263 lb (119.3 kg)  05/28/18 263 lb (119.3 kg)    General: Looks older than his stated age. Alert and in no acute distress. Smells of tobacco.  Weight still up from his last visit here.  HEENT: Normal.  Neck: Supple, no JVD, carotid bruits, or masses noted.  Cardiac: Heart tones are distant.  No edema.  Respiratory:  Lungs are coarse bilaterally with normal work of breathing.  GI: Soft and nontender.  MS: No deformity or atrophy. Gait and ROM intact.  Skin: Warm and dry. Color is normal.  Neuro:  Strength and sensation are intact and no gross focal deficits noted.  Psych: Alert, appropriate and with normal affect.   LABORATORY DATA:  EKG:  EKG is not ordered today.  Lab Results  Component Value Date   WBC 9.0 06/27/2018   HGB 14.1 06/27/2018   HCT 42.0 06/27/2018   PLT 238 06/27/2018   GLUCOSE 151 (H) 06/27/2018   CHOL 279 (H) 10/19/2016   TRIG 313 (H) 10/19/2016   HDL 33 (L) 10/19/2016   LDLCALC 183 (H) 10/19/2016   ALT 20 05/28/2018   AST 25 05/28/2018   NA 143  06/27/2018   K 3.8 06/27/2018   CL 108 06/27/2018   CREATININE 1.08 06/27/2018   BUN  11 06/27/2018   CO2 23 06/27/2018   TSH 1.920 11/17/2016   INR 0.97 10/19/2016   HGBA1C 6.1 (H) 10/19/2016     BNP (last 3 results) Recent Labs    12/25/17 1750 05/28/18 1013 06/27/18 0841  BNP 222.4* 99.0 222.0*    ProBNP (last 3 results) No results for input(s): PROBNP in the last 8760 hours.   Other Studies Reviewed Today: Coronary Balloon Angioplasty 10/2016  Intravascular Ultrasound/IVUS  Left Heart Cath and Coronary Angiography  Conclusion     Prox Cx to Mid Cx lesion, 10 %stenosed.  Prox RCA lesion, 100 %stenosed. Chronic total occlusion.  2nd Diag lesion, 45 %stenosed.  Prox LAD lesion, 100 %stenosed. This was the culprit lesion and treated with thrombectomy and angioplasty, > 4 mm. IVUS showed the vessel diameter at approximaltely 4 mm.  Post intervention, there is a 0% residual stenosis.  There is no aortic valve stenosis.  LV end diastolic pressure is severely elevated.   He needs aggressive risk factor modification. He needs to be compliant with dual antiplatelet therapy. We elected not to place any additional stents since there were questions about compliance. There was inadequate result with just balloon dilatation with intravascular ultrasound guidance. He states he was taking his Brilinta. In the event that he had missed some doses, we gave a dose of intravenous tirofiban. He was again loaded with oral Brilinta. He'll need aggressive medical therapy for his systolic dysfunction. Elevated LVEDP. Hold off on IV fluids at this point.  He will be watched in the CCU.    Echo Study Conclusions 08/2016  - Left ventricle: LVEF Is approximately 40% with hypokinesis of the   base / mid inerrior / inferoseptal walls akinesis of the distal   inferior/inferoseptal walls and apex.   No significant difference from echo of fall 2017 The cavity size   was moderately dilated.  Wall thickness was normal. - Left atrium: The atrium was mildly dilated.  Impressions:  - Poor acoustic windows limit study  Assessment/Plan:  1. Ischemic CM - last EF > 40%. Will get updated. Has had 2 ER visits for dyspnea/acute CHF. Compliance seems to be the issue - admits to too much salt use.   2. CAD: with anterior STEMI. Emergent cath 10/19/16  showed 100% prox LAD occlusion s/p thrombectomy and PCTA. Dr. Eldridge Dace elected not to place any additional stents since there were questions about compliance at that time.  Known CTO RCA. Fortunately no chest pain.     .   3. HTN: on ARB and beta blocker.   4. HLD - lab today - restarting Fenofibrate.     5. Tobacco abuse: continues to smoke - does not seem ready to stop.   6. Depression: on Welbutrin per PCP.   7. Non compliance - main issue - reminded about salt restriction and daily weights. Medicines are refilled today as well.    Current medicines are reviewed with the patient today.  The patient does not have concerns regarding medicines other than what has been noted above.  The following changes have been made:  See above.  Labs/ tests ordered today include:    Orders Placed This Encounter  Procedures  . Basic metabolic panel  . CBC  . Hepatic function panel  . Lipid panel  . Pro b natriuretic peptide (BNP)  . ECHOCARDIOGRAM COMPLETE     Disposition:   FU with me or Dr. Eden Emms in about 2 months. May need to send back to EP  based on echo findings.    Patient is agreeable to this plan and will call if any problems develop in the interim.   SignedNorma Fredrickson, NP  07/02/2018 10:01 AM  [facility] [address] [place], [place]  [address] Phone: [phone] Fax: [phone]

## 2018-07-10 ENCOUNTER — Other Ambulatory Visit (HOSPITAL_COMMUNITY): Payer: Medicare Other

## 2018-07-25 ENCOUNTER — Telehealth: Payer: Self-pay | Admitting: Cardiovascular Disease

## 2018-07-25 NOTE — Telephone Encounter (Signed)
CXR and BNP ok recently make sure he is taking his meds appropriately if still on Brillinta can change to plavix

## 2018-07-25 NOTE — Telephone Encounter (Signed)
New message   Patient would like to know if any of the medications that he is taking would cause sob? Please call to discuss.

## 2018-07-25 NOTE — Telephone Encounter (Signed)
Called patient back with Dr. Fabio BeringNishan's message. Went over patient's medications again to make sure he is taking them appropriately. Patient is being compliant with medications. Informed patient that he would see how he feels the next couple of days to see if his symptoms persist and he can let me know if he wants to change to plavix. Patient agreed to plan and will let us know by Monday if his symptoms come back.

## 2018-07-25 NOTE — Telephone Encounter (Signed)
Called patient back about his message. Patient complaining of SOB. Patient stated he is fine when he is active, but feels like he is catching his breath when he is just sitting around, like a skipped beat. Patient denies any chest pain. Patient is taking all his medications as directed. Patient was thinking his symptoms might be from one of his medications. Informed patient if he was taking a new medication that might be the case, but he has been on all his medications for a long while. Patient is having an echo on Monday and has f/u with Dr. Eden EmmsNishan in January. Will forward to Dr. Eden EmmsNishan for advisement.

## 2018-07-30 ENCOUNTER — Encounter (INDEPENDENT_AMBULATORY_CARE_PROVIDER_SITE_OTHER): Payer: Self-pay

## 2018-07-30 ENCOUNTER — Ambulatory Visit (HOSPITAL_COMMUNITY): Payer: Medicare Other | Attending: Cardiology

## 2018-07-30 ENCOUNTER — Other Ambulatory Visit: Payer: Self-pay

## 2018-07-30 DIAGNOSIS — I255 Ischemic cardiomyopathy: Secondary | ICD-10-CM | POA: Insufficient documentation

## 2018-07-30 MED ORDER — PERFLUTREN LIPID MICROSPHERE
1.0000 mL | INTRAVENOUS | Status: AC | PRN
  Administered 2018-07-30: 1 mL via INTRAVENOUS

## 2018-07-31 ENCOUNTER — Telehealth: Payer: Self-pay | Admitting: Nurse Practitioner

## 2018-07-31 ENCOUNTER — Other Ambulatory Visit: Payer: Self-pay | Admitting: *Deleted

## 2018-07-31 ENCOUNTER — Ambulatory Visit: Payer: Medicare Other | Admitting: Nurse Practitioner

## 2018-07-31 MED ORDER — LOSARTAN POTASSIUM 100 MG PO TABS: 100.0000 mg | ORAL_TABLET | Freq: Every day | ORAL | 0 refills | Status: DC

## 2018-07-31 NOTE — Telephone Encounter (Signed)
New message ° ° °Patient is returning call for echo results.  °

## 2018-09-03 NOTE — Progress Notes (Signed)
CARDIOLOGY OFFICE NOTE  Date:  09/05/2018    Peter Russo Date of Birth: February 08, 1976 Medical Record #[id number]  PCP:  Hoy Register, MD  Cardiologist:  Eden Emms    No chief complaint on file.   History of Present Illness:  43 y.o. f/u for CAD and Ischemic DCM.  CRF;s include HTN, HLD and DM-2. Has had multiple interventions to LAD most recently apical LAD in 2017. Last cath August 2017 with SEMI troponin 3.38 patent stents no culprits and known CTO of RCA. Sedentary and still smoking Has had dyspnea over the winter   TTE 07/30/18 EF 20-25% grade 2 diastolic  Mild MR   Seen by NP and losartan increased to 100 mg  QRS is narrow and he would not benefit fro BiV pacing   He still is functional class 3 Not working Has medicare/medicaid and not clear that he can afford Entresto Has no scale at home   Comes in today. Here with his fiance'. He does not know any of his medicines but is here to "see what I am suppose to be on".  Says he has been to the ER twice due to shortness of breath - says that is better now. He had bloating of his abdomen as well - no real peripheral edema. He is on Lasix. He brought in his medicines that he says he is taking. Does not correlate with our list. Tells me he is taking his Aldactone every day - not "prn". He has a bottle of Brilinta. Not on fenofibrate - unclear why. He denies chest pain. Smoking "some". No real exercise. November BNP was 285 CXR CE no acute CHF   Past Medical History:  Diagnosis Date  . Anxiety   . CAD S/P percutaneous coronary angioplasty    a. s/p BMS-mLAD 2010. b. DES to RCA 2012. c. 04/2015: DES to LCx and PCI to LAD c/b dissection with subsequent overlapping DES to mLAD - r/i for NSTEMI afterwards; d. 08/2015 Ant STEMI in setting of noncompliance->123mLAD ISR (3.0x16 Synergy DES); e. 03/2016 PCI of apical LAD; f. 03/2016 Relook Cath: patent LAD stents, RCA 100p CTO-->Med Rx; g. 10/2016 Ant STEMI: PTCA 100p LAD.  Marland Kitchen Chronic  combined systolic and diastolic CHF (congestive heart failure) (HCC)    a. 03/2016 Echo: EF 30-35%, Gr2 DD;  b. 08/2016 Echo: EF 40%.  . Depression   . Essential hypertension   . GAD (generalized anxiety disorder)   . GERD (gastroesophageal reflux disease)   . Hypercholesteremia   . Ischemic cardiomyopathy    a. prior EF 25-35 percent at cath, 35-40 percent by echo; b. 03/2016 Echo: EF 30-35%, diff HK, Gr2 DD; c. 08/2016 Echo: EF 40%, base/mid inferior/inferowseptal AK, mildly dil LA.  . Morbid obesity (HCC)   . Pre-diabetes   . SVT (supraventricular tachycardia) (HCC) APRROX 10 YRS AGO   PRESUMED AVNRT, ECHO 12/07 WITH EF 65%, MILD LAE  . Tobacco abuse    a. 20 + pack years, quit 10/2016.    Past Surgical History:  Procedure Laterality Date  . CARDIAC CATHETERIZATION N/A 04/23/2015   Procedure: Left Heart Cath and Coronary Angiography;  Surgeon: Wendall Stade, MD;  Location: [facility] INVASIVE CV LAB;  Service: Cardiovascular;  Laterality: N/A;  . CARDIAC CATHETERIZATION N/A 04/23/2015   Procedure: Coronary Stent Intervention;  Surgeon: Tonny Bollman, MD;  Location: [facility] INVASIVE CV LAB;  Service: Cardiovascular;  Laterality: N/A;  . CARDIAC CATHETERIZATION N/A 09/14/2015   Procedure: Left Heart Cath and  Coronary Angiography;  Surgeon: Lyn Records, MD;  LAD 100%, CFX 10% ISR, RCA 100% (chronic), EF 25-35%  . CARDIAC CATHETERIZATION N/A 09/14/2015   Procedure: Coronary Stent Intervention;  Surgeon: Lyn Records, MD;  Location: [facility] INVASIVE CV LAB;  Service: Cardiovascular;  Laterality: N/A; 3.0 x 16 mm Synergy DES LAD  . CARDIAC CATHETERIZATION  03/16/2016  . CARDIAC CATHETERIZATION N/A 03/16/2016   Procedure: Left Heart Cath and Coronary Angiography;  Surgeon: Lyn Records, MD;  Location: [facility] INVASIVE CV LAB;  Service: Cardiovascular;  Laterality: N/A;  . CARDIAC CATHETERIZATION N/A 03/16/2016   Procedure: Coronary Balloon Angioplasty;  Surgeon: Lyn Records, MD;  Location: [facility] INVASIVE CV LAB;  Service:  Cardiovascular;  Laterality: N/A;  . CARDIAC CATHETERIZATION N/A 03/25/2016   Procedure: Left Heart Cath and Coronary Angiography;  Surgeon: Jamekia Gannett M Swaz[place], MD;  Location: [facility] INVASIVE CV LAB;  Service: Cardiovascular;  Laterality: N/A;  . CORONARY ANGIOPLASTY WITH STENT PLACEMENT  2010; 2013  . CORONARY BALLOON ANGIOPLASTY N/A 10/19/2016   Procedure: Coronary Balloon Angioplasty;  Surgeon: Corky Crafts, MD;  Location: [facility] INVASIVE CV LAB;  Service: Cardiovascular;  Laterality: N/A;  . INTRAVASCULAR ULTRASOUND/IVUS N/A 10/19/2016   Procedure: Intravascular Ultrasound/IVUS;  Surgeon: Corky Crafts, MD;  Location: [facility] INVASIVE CV LAB;  Service: Cardiovascular;  Laterality: N/A;  . LEFT HEART CATH AND CORONARY ANGIOGRAPHY N/A 10/19/2016   Procedure: Left Heart Cath and Coronary Angiography;  Surgeon: Corky Crafts, MD;  Location: [facility] INVASIVE CV LAB;  Service: Cardiovascular;  Laterality: N/A;     Medications: Current Meds  Medication Sig  . acetaminophen (TYLENOL) 325 MG tablet Take 2 tablets (650 mg total) by mouth every 4 (four) hours as needed for headache or mild pain.  Marland Kitchen aspirin 81 MG EC tablet Take 1 tablet (81 mg total) by mouth daily.  Marland Kitchen atorvastatin (LIPITOR) 80 MG tablet Take 80 mg by mouth every evening.  Marland Kitchen buPROPion (WELLBUTRIN XL) 150 MG 24 hr tablet Take 150 mg by mouth 2 (two) times daily.  . carvedilol (COREG) 12.5 MG tablet Take 1 tablet (12.5 mg total) by mouth 2 (two) times daily.  . fenofibrate 160 MG tablet Take 1 tablet (160 mg total) by mouth daily.  . furosemide (LASIX) 40 MG tablet Take 40 mg by mouth.  . losartan (COZAAR) 100 MG tablet Take 1 tablet (100 mg total) by mouth daily.  . nitroGLYCERIN (NITROSTAT) 0.4 MG SL tablet Place 1 tablet (0.4 mg total) under the tongue every 5 (five) minutes as needed for chest pain (3 x).  . spironolactone (ALDACTONE) 25 MG tablet Take 1 tablet by mouth daily  . ticagrelor (BRILINTA) 90 MG TABS tablet Take 1 tablet (90 mg  total) by mouth 2 (two) times daily.     Allergies: No Known Allergies  Social History: The patient  reports that he has been smoking. He has a 24.00 pack-year smoking history. He has never used smokeless tobacco. He reports current drug use. Drug: Marijuana. He reports that he does not drink alcohol.   Family History: The patient's family history includes Arrhythmia in his mother; Coronary artery disease in his mother; Coronary artery disease (age of onset: 54) in his father; Heart attack in his brother, father, and mother; Heart disease in his father; Hypertension in his mother.   Review of Systems: Please see the history of present illness.   Otherwise, the review of systems is positive for none.   All other systems are reviewed and  negative.   Physical Exam: VS:  BP 122/84   Pulse 82   Ht 5\' 7"  (1.702 m)   Wt 282 lb (127.9 kg)   SpO2 97%   BMI 44.17 kg/m  .  BMI Body mass index is 44.17 kg/m.  Wt Readings from Last 3 Encounters:  09/05/18 282 lb (127.9 kg)  07/02/18 279 lb 6.4 oz (126.7 kg)  06/27/18 263 lb (119.3 kg)    Affect appropriate Chronically ill black male  HEENT: normal Neck supple with no adenopathy JVP normal no bruits no thyromegaly Lungs clear with no wheezing and good diaphragmatic motion Heart:  S1/S2 no murmur, no rub, gallop or click PMI enlarged  Abdomen: benighn, BS positve, no tenderness, no AAA no bruit.  No HSM or HJR Distal pulses intact with no bruits No edema Neuro non-focal Skin warm and dry No muscular weakness    LABORATORY DATA:  EKG:  SR rate 104 narrow complex 06/28/18   Lab Results  Component Value Date   WBC 8.9 07/02/2018   HGB 15.3 07/02/2018   HCT 44.5 07/02/2018   PLT 270 07/02/2018   GLUCOSE 160 (H) 07/02/2018   CHOL 241 (H) 07/02/2018   TRIG 164 (H) 07/02/2018   HDL 44 07/02/2018   LDLCALC 164 (H) 07/02/2018   ALT 22 07/02/2018   AST 18 07/02/2018   NA 140 07/02/2018   K 4.2 07/02/2018   CL 98 07/02/2018    CREATININE 1.23 07/02/2018   BUN 14 07/02/2018   CO2 25 07/02/2018   TSH 1.440 07/02/2018   INR 0.97 10/19/2016   HGBA1C 6.1 (H) 10/19/2016     BNP (last 3 results) Recent Labs    12/25/17 1750 05/28/18 1013 06/27/18 0841  BNP 222.4* 99.0 222.0*    ProBNP (last 3 results) Recent Labs    07/02/18 1011  PROBNP 285*     Other Studies Reviewed Today: Coronary Balloon Angioplasty 10/2016  Intravascular Ultrasound/IVUS  Left Heart Cath and Coronary Angiography  Conclusion     Prox Cx to Mid Cx lesion, 10 %stenosed.  Prox RCA lesion, 100 %stenosed. Chronic total occlusion.  2nd Diag lesion, 45 %stenosed.  Prox LAD lesion, 100 %stenosed. This was the culprit lesion and treated with thrombectomy and angioplasty, > 4 mm. IVUS showed the vessel diameter at approximaltely 4 mm.  Post intervention, there is a 0% residual stenosis.  There is no aortic valve stenosis.  LV end diastolic pressure is severely elevated.   He needs aggressive risk factor modification. He needs to be compliant with dual antiplatelet therapy. We elected not to place any additional stents since there were questions about compliance. There was inadequate result with just balloon dilatation with intravascular ultrasound guidance. He states he was taking his Brilinta. In the event that he had missed some doses, we gave a dose of intravenous tirofiban. He was again loaded with oral Brilinta. He'll need aggressive medical therapy for his systolic dysfunction. Elevated LVEDP. Hold off on IV fluids at this point.  He will be watched in the CCU.    Echo Study Conclusions 08/2016  - Left ventricle: LVEF Is approximately 40% with hypokinesis of the   base / mid inerrior / inferoseptal walls akinesis of the distal   inferior/inferoseptal walls and apex.   No significant difference from echo of fall 2017 The cavity size   was moderately dilated. Wall thickness was normal. - Left atrium: The atrium was  mildly dilated.  Impressions:  - Poor acoustic windows limit  study  Assessment/Plan:  1. Ischemic CM - with elevated filling pressures EF 20-25% TTE 07/30/18  At high risk for repeated hospital Admissions Will check BMET/BNP f/u CHF clinic to see if he can get a scale and start on Entresto If BNP Elevated will increase aldactone to 50 mg daily   2. CAD: with anterior STEMI. Emergent cath 10/19/16  showed 100% prox LAD occlusion s/p thrombectomy and PCTA. Dr. Eldridge DaceVaranasi elected not to place any additional stents since there were questions about compliance at that time.  Known CTO RCA. Fortunately no chest pain. MRI in 3 months after optimum medial Rx to assess EF and scar burden regarding AICD    .   3. HTN: on ARB and beta blocker.   4. HLD - lab today - restarting Fenofibrate.     5. Tobacco abuse: continues to smoke - does not seem ready to stop.   6. Depression: on Welbutrin per PCP.   7. Non compliance - main issue - reminded about salt restriction and daily weights. Medicines are refilled today as well.    Current medicines are reviewed with the patient today.  The patient does not have concerns regarding medicines other than what has been noted above.  The following changes have been made:  See above.  Labs/ tests ordered today BNP/BMET   No orders of the defined types were placed in this encounter.    Disposition:   FU CHF clinic and me in 6 months    Patient is agreeable to this plan and will call if any problems develop in the interim.   Signed: Charlton HawsPeter Alaylah Heatherington, MD  09/05/2018 10:23 AM  [facility] [address] [place], [place]  [address] Phone: [phone] Fax: [phone]

## 2018-09-05 ENCOUNTER — Telehealth: Payer: Self-pay

## 2018-09-05 ENCOUNTER — Ambulatory Visit (INDEPENDENT_AMBULATORY_CARE_PROVIDER_SITE_OTHER): Payer: Medicare Other | Admitting: Cardiovascular Disease

## 2018-09-05 ENCOUNTER — Encounter: Payer: Self-pay | Admitting: Cardiovascular Disease

## 2018-09-05 VITALS — BP 122/84 | HR 82 | Ht 67.0 in | Wt 282.0 lb

## 2018-09-05 DIAGNOSIS — R931 Abnormal findings on diagnostic imaging of heart and coronary circulation: Secondary | ICD-10-CM

## 2018-09-05 DIAGNOSIS — R9430 Abnormal result of cardiovascular function study, unspecified: Secondary | ICD-10-CM

## 2018-09-05 DIAGNOSIS — E785 Hyperlipidemia, unspecified: Secondary | ICD-10-CM

## 2018-09-05 DIAGNOSIS — I255 Ischemic cardiomyopathy: Secondary | ICD-10-CM

## 2018-09-05 DIAGNOSIS — I10 Essential (primary) hypertension: Secondary | ICD-10-CM

## 2018-09-05 DIAGNOSIS — Z79899 Other long term (current) drug therapy: Secondary | ICD-10-CM

## 2018-09-05 DIAGNOSIS — R0602 Shortness of breath: Secondary | ICD-10-CM

## 2018-09-05 DIAGNOSIS — I2510 Atherosclerotic heart disease of native coronary artery without angina pectoris: Secondary | ICD-10-CM | POA: Diagnosis not present

## 2018-09-05 DIAGNOSIS — R7989 Other specified abnormal findings of blood chemistry: Secondary | ICD-10-CM

## 2018-09-05 LAB — BASIC METABOLIC PANEL
BUN/Creatinine Ratio: 9 (ref 9–20)
BUN: 10 mg/dL (ref 6–24)
CO2: 23 mmol/L (ref 20–29)
Calcium: 9 mg/dL (ref 8.7–10.2)
Chloride: 100 mmol/L (ref 96–106)
Creatinine, Ser: 1.15 mg/dL (ref 0.76–1.27)
GFR calc Af Amer: 89 mL/min/{1.73_m2} (ref >59)
GFR calc non Af Amer: 77 mL/min/{1.73_m2} (ref >59)
Glucose: 135 mg/dL — ABNORMAL HIGH (ref 65–99)
Potassium: 4.1 mmol/L (ref 3.5–5.2)
Sodium: 139 mmol/L (ref 134–144)

## 2018-09-05 LAB — PRO B NATRIURETIC PEPTIDE: NT-Pro BNP: 305 pg/mL — ABNORMAL HIGH (ref 0–86)

## 2018-09-05 MED ORDER — SPIRONOLACTONE 25 MG PO TABS: 25.0000 mg | ORAL_TABLET | Freq: Two times a day (BID) | ORAL | 3 refills | Status: DC

## 2018-09-05 NOTE — Telephone Encounter (Signed)
-----   Message from Wendall Stade, MD sent at 09/05/2018  5:01 PM EST ----- BNP mildly elevated increase aldactone to 25 bid and check his labs in 3 weeks BMET and BNP

## 2018-09-05 NOTE — Patient Instructions (Addendum)
Medication Instructions:   If you need a refill on your cardiac medications before your next appointment, please call your pharmacy.   Lab work: Your physician recommends that you have lab work today- BMET and BNP  If you have labs (blood work) drawn today and your tests are completely normal, you will receive your results only by: Marland Kitchen MyChart Message (if you have MyChart) OR . A paper copy in the mail If you have any lab test that is abnormal or we need to change your treatment, we will call you to review the results.  Testing/Procedures: None ordered today.  Follow-Up: At [facility], you and your health needs are our priority.  As part of our continuing mission to provide you with exceptional heart care, we have created designated Provider Care Teams.  These Care Teams include your primary Cardiologist (physician) and Advanced Practice Providers (APPs -  Physician Assistants and Nurse Practitioners) who all work together to provide you with the care you need, when you need it. Your physician recommends that you schedule a new patient appointment with heart failure to start Entresto.  You will need a follow up appointment in 6 months.  Please call our office 2 months in advance to schedule this appointment.  You may see Dr. Eden Emms or one of the following Advanced Practice Providers on your designated Care Team:   Norma Fredrickson, NP [name], NP . Georgie Chard, NP

## 2018-09-05 NOTE — Telephone Encounter (Signed)
Patient aware of lab results. Per Dr. Eden Emms, BNP mildly elevated increase aldactone to 25 bid and check his labs in 3 weeks BMET and BNP. Patient verbalized understanding and will come in on 09/26/18 for repeat lab work.

## 2018-09-26 ENCOUNTER — Other Ambulatory Visit: Payer: Medicare Other | Admitting: *Deleted

## 2018-09-26 DIAGNOSIS — Z79899 Other long term (current) drug therapy: Secondary | ICD-10-CM | POA: Diagnosis not present

## 2018-09-26 DIAGNOSIS — R7989 Other specified abnormal findings of blood chemistry: Secondary | ICD-10-CM

## 2018-09-26 DIAGNOSIS — R0602 Shortness of breath: Secondary | ICD-10-CM | POA: Diagnosis not present

## 2018-09-26 LAB — BASIC METABOLIC PANEL
BUN/Creatinine Ratio: 9 (ref 9–20)
BUN: 11 mg/dL (ref 6–24)
CO2: 24 mmol/L (ref 20–29)
Calcium: 9.1 mg/dL (ref 8.7–10.2)
Chloride: 100 mmol/L (ref 96–106)
Creatinine, Ser: 1.22 mg/dL (ref 0.76–1.27)
GFR calc Af Amer: 83 mL/min/{1.73_m2} (ref >59)
GFR calc non Af Amer: 72 mL/min/{1.73_m2} (ref >59)
Glucose: 123 mg/dL — ABNORMAL HIGH (ref 65–99)
Potassium: 4.4 mmol/L (ref 3.5–5.2)
Sodium: 138 mmol/L (ref 134–144)

## 2018-09-26 LAB — PRO B NATRIURETIC PEPTIDE: NT-Pro BNP: 246 pg/mL — ABNORMAL HIGH (ref 0–86)

## 2018-10-08 ENCOUNTER — Encounter (HOSPITAL_COMMUNITY): Payer: Medicare Other

## 2018-11-27 ENCOUNTER — Other Ambulatory Visit: Payer: Self-pay | Admitting: Cardiovascular Disease

## 2018-11-28 ENCOUNTER — Telehealth: Payer: Self-pay | Admitting: *Deleted

## 2018-11-28 NOTE — Telephone Encounter (Signed)
[facility] request refill for Wellbutrin

## 2018-11-28 NOTE — Telephone Encounter (Signed)
°*  STAT* If patient is at the pharmacy, call can be transferred to refill team.   1. Which medications need to be refilled? (please list name of each medication and dose if known)  Need a new  Bupropion  2. Which pharmacy/location (including street and city if local pharmacy) is medication to be sent to? [facility] [phone]   3. Do they need a 30 day or 90 day supply?180 and refills

## 2018-11-28 NOTE — Telephone Encounter (Signed)
[facility], is requesting a refill for Wellbutrin 150 mg, is this ok to refill or send to PCP? pls advise thanks

## 2018-11-28 NOTE — Telephone Encounter (Signed)
[facility] request Wellbutrin 150 mg, is this ok to refill or PCP? pls advise. thanks

## 2018-11-29 NOTE — Telephone Encounter (Signed)
Already sent in for refill.

## 2018-12-22 ENCOUNTER — Other Ambulatory Visit: Payer: Self-pay

## 2018-12-22 ENCOUNTER — Emergency Department: Payer: Medicare Other

## 2018-12-22 ENCOUNTER — Encounter: Payer: Self-pay | Admitting: Emergency Medicine

## 2018-12-22 DIAGNOSIS — R079 Chest pain, unspecified: Secondary | ICD-10-CM | POA: Insufficient documentation

## 2018-12-22 DIAGNOSIS — I110 Hypertensive heart disease with heart failure: Secondary | ICD-10-CM | POA: Diagnosis not present

## 2018-12-22 DIAGNOSIS — I252 Old myocardial infarction: Secondary | ICD-10-CM | POA: Insufficient documentation

## 2018-12-22 DIAGNOSIS — Z7982 Long term (current) use of aspirin: Secondary | ICD-10-CM | POA: Insufficient documentation

## 2018-12-22 DIAGNOSIS — Z79899 Other long term (current) drug therapy: Secondary | ICD-10-CM | POA: Diagnosis not present

## 2018-12-22 DIAGNOSIS — I5042 Chronic combined systolic (congestive) and diastolic (congestive) heart failure: Secondary | ICD-10-CM | POA: Insufficient documentation

## 2018-12-22 DIAGNOSIS — I2510 Atherosclerotic heart disease of native coronary artery without angina pectoris: Secondary | ICD-10-CM | POA: Diagnosis not present

## 2018-12-22 DIAGNOSIS — Z87891 Personal history of nicotine dependence: Secondary | ICD-10-CM | POA: Diagnosis not present

## 2018-12-22 LAB — BASIC METABOLIC PANEL
Anion gap: 9 (ref 5–15)
BUN: 15 mg/dL (ref 6–20)
CO2: 25 mmol/L (ref 22–32)
Calcium: 9.4 mg/dL (ref 8.9–10.3)
Chloride: 105 mmol/L (ref 98–111)
Creatinine, Ser: 1.14 mg/dL (ref 0.61–1.24)
GFR calc Af Amer: >60 mL/min (ref >60)
GFR calc non Af Amer: >60 mL/min (ref >60)
Glucose, Bld: 180 mg/dL — ABNORMAL HIGH (ref 70–99)
Potassium: 4 mmol/L (ref 3.5–5.1)
Sodium: 139 mmol/L (ref 135–145)

## 2018-12-22 LAB — CBC
HCT: 43.3 % (ref 39.0–52.0)
Hemoglobin: 15 g/dL (ref 13.0–17.0)
MCH: 29.5 pg (ref 26.0–34.0)
MCHC: 34.6 g/dL (ref 30.0–36.0)
MCV: 85.2 fL (ref 80.0–100.0)
Platelets: 231 10*3/uL (ref 150–400)
RBC: 5.08 MIL/uL (ref 4.22–5.81)
RDW: 13.4 % (ref 11.5–15.5)
WBC: 9.8 10*3/uL (ref 4.0–10.5)
nRBC: 0 % (ref 0.0–0.2)

## 2018-12-22 LAB — TROPONIN I: Troponin I: <0.03 ng/mL (ref <0.03)

## 2018-12-22 MED ORDER — SODIUM CHLORIDE 0.9% FLUSH: 3.0000 mL | Freq: Once | INTRAVENOUS | Status: DC

## 2018-12-22 NOTE — ED Notes (Signed)
Patient given update on wait time. Patient verbalizes understanding.  

## 2018-12-22 NOTE — ED Triage Notes (Signed)
Pt to ED via POV c/o chest pain that started yesterday. Pt states that the pain is located in the center of his chest, does not radiate. Pt states that the pain comes and goes. Pt denies any associated symptoms with the chest pain. Pt has hx/o MI x 4. Pt is currently in NAD.

## 2018-12-23 ENCOUNTER — Emergency Department
Admission: EM | Admit: 2018-12-23 | Discharge: 2018-12-23 | Disposition: A | Discharge status: Home / Self Care | Payer: Medicare Other | Attending: Emergency Medicine | Admitting: Emergency Medicine

## 2018-12-23 DIAGNOSIS — R079 Chest pain, unspecified: Secondary | ICD-10-CM

## 2018-12-23 NOTE — ED Provider Notes (Signed)
[facility] Emergency Department Provider Note  Time seen: 12:17 AM  I have reviewed the triage vital signs and the nursing notes.   HISTORY  Chief Complaint Chest Pain    HPI Peter Russo is a 43 y.o. male with a past medical history of anxiety, CAD, 4 MIs, gastric reflux, hyperlipidemia, cardiomyopathy, obesity, presents to the emergency department for intermittent chest discomfort.  According to the patient over the past 2 to 3 days he has been experiencing intermittent chest discomfort.  Also states mild shortness of breath at times but states this is fairly chronic for him.  Denies any nausea or diaphoresis.  Denies any cough or fever.  No recent sick contacts.  Patient denies any discomfort currently.  However he states given his history he wanted to be safe.    Past Medical History:  Diagnosis Date  . Anxiety   . CAD S/P percutaneous coronary angioplasty    a. s/p BMS-mLAD 2010. b. DES to RCA 2012. c. 04/2015: DES to LCx and PCI to LAD c/b dissection with subsequent overlapping DES to mLAD - r/i for NSTEMI afterwards; d. 08/2015 Ant STEMI in setting of noncompliance->15mLAD ISR (3.0x16 Synergy DES); e. 03/2016 PCI of apical LAD; f. 03/2016 Relook Cath: patent LAD stents, RCA 100p CTO-->Med Rx; g. 10/2016 Ant STEMI: PTCA 100p LAD.  Marland Kitchen Chronic combined systolic and diastolic CHF (congestive heart failure) (HCC)    a. 03/2016 Echo: EF 30-35%, Gr2 DD;  b. 08/2016 Echo: EF 40%.  . Depression   . Essential hypertension   . GAD (generalized anxiety disorder)   . GERD (gastroesophageal reflux disease)   . Hypercholesteremia   . Ischemic cardiomyopathy    a. prior EF 25-35 percent at cath, 35-40 percent by echo; b. 03/2016 Echo: EF 30-35%, diff HK, Gr2 DD; c. 08/2016 Echo: EF 40%, base/mid inferior/inferowseptal AK, mildly dil LA.  . Morbid obesity (HCC)   . Pre-diabetes   . SVT (supraventricular tachycardia) (HCC) APRROX 10 YRS AGO   PRESUMED AVNRT, ECHO 12/07 WITH  EF 65%, MILD LAE  . Tobacco abuse    a. 20 + pack years, quit 10/2016.    Patient Active Problem List   Diagnosis Date Noted  . Stable angina (HCC)   . Elevated troponin   . Mixed hyperlipidemia   . Chest pain 11/17/2016  . CAD (coronary artery disease) 11/12/2016  . AKI (acute kidney injury) (HCC) 11/11/2016  . History of acute anterior wall MI 10/19/2016  . Chronic combined systolic and diastolic CHF (congestive heart failure) (HCC)   . Ischemic cardiomyopathy   . H/O medication noncompliance   . Ischemic chest pain (HCC)   . Morbid obesity (HCC)   . Pre-diabetes   . Tobacco abuse   . CAD S/P percutaneous coronary angioplasty   . Essential hypertension   . Cellulitis of hand 05/27/2015  . 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 05/26/2015  . NSTEMI (non-ST elevated myocardial infarction) (HCC) 04/24/2015  . Depression 12/26/2014  . GAD (generalized anxiety disorder) 12/26/2014  . GERD (gastroesophageal reflux disease) 03/01/2013  . Dyslipidemia 03/14/2011    Past Surgical History:  Procedure Laterality Date  . CARDIAC CATHETERIZATION N/A 04/23/2015   Procedure: Left Heart Cath and Coronary Angiography;  Surgeon: Wendall Stade, MD;  [facility] INVASIVE CV LAB;  Service: Cardiovascular;  Laterality: N/A;  . CARDIAC CATHETERIZATION N/A 04/23/2015   Procedure: Coronary Stent Intervention;  Surgeon: Tonny Bollman, MD;  Location: [facility] INVASIVE CV LAB;  Service: Cardiovascular;  Laterality: N/A;  . CARDIAC CATHETERIZATION N/A 09/14/2015   Procedure: Left Heart Cath and Coronary Angiography;  Surgeon: Lyn RecordsHenry W Smith, MD;  LAD 100%, CFX 10% ISR, RCA 100% (chronic), EF 25-35%  . CARDIAC CATHETERIZATION N/A 09/14/2015   Procedure: Coronary Stent Intervention;  Surgeon: Lyn RecordsHenry W Smith, MD;  [facility];  Service: Cardiovascular;  Laterality: N/A; 3.0 x 16 mm Synergy DES LAD  . CARDIAC CATHETERIZATION  03/16/2016  . CARDIAC CATHETERIZATION N/A 03/16/2016   Procedure:  Left Heart Cath and Coronary Angiography;  Surgeon: Lyn RecordsHenry W Smith, MD;  [facility];  Service: Cardiovascular;  Laterality: N/A;  . CARDIAC CATHETERIZATION N/A 03/16/2016   Procedure: Coronary Balloon Angioplasty;  Surgeon: Lyn RecordsHenry W Smith, MD;  [facility];  Service: Cardiovascular;  Laterality: N/A;  . CARDIAC CATHETERIZATION N/A 03/25/2016   Procedure: Left Heart Cath and Coronary Angiography;  Surgeon: Peter M SwazilandJordan, MD;  [facility];  Service: Cardiovascular;  Laterality: N/A;  . CORONARY ANGIOPLASTY WITH STENT PLACEMENT  2010; 2013  . CORONARY BALLOON ANGIOPLASTY N/A 10/19/2016   Procedure: Coronary Balloon Angioplasty;  Surgeon: Corky CraftsJayadeep S Varanasi, MD;  [facility];  Service: Cardiovascular;  Laterality: N/A;  . INTRAVASCULAR ULTRASOUND/IVUS N/A 10/19/2016   Procedure: Intravascular Ultrasound/IVUS;  Surgeon: Corky CraftsJayadeep S Varanasi, MD;  [facility]: [facility] INVAS[facility] CV LAB;  Service: Cardiovascular;  Laterality: N/A;  . LEFT HEART CATH AND CORONARY ANGIOGRAPHY N/A 10/19/2016   Procedure: Left Heart Cath and Coronary Angiography;  Surgeon: Corky CraftsJayadeep S Varanasi, MD;  [facility];  Service: Cardiovascular;  Laterality: N/A;    Prior to Admission medications   Medication Sig Start Date End Date Taking? Authorizing Provider  acetaminophen (TYLENOL) 325 MG tablet Take 2 tablets (650 mg total) by mouth every 4 (four) hours as needed for headache or mild pain. 11/13/16   Abelino DerrickKilroy, Luke K, PA-C  aspirin 81 MG EC tablet Take 1 tablet (81 mg total) by mouth daily. 10/21/16   Duke, Roe RutherfordAngela Nicole, PA  atorvastatin (LIPITOR) 80 MG tablet Take 80 mg by mouth every evening.    [provider]  buPROPion (WELLBUTRIN XL) 150 MG 24 hr tablet Take 150 mg by mouth 2 (two) times daily.    [provider]  buPROPion (ZYBAN) 150 MG 12 hr tablet TAKE 1 TABLET BY MOUTH TWICE A DAY 11/28/18   Wendall StadeNishan, Peter C, MD  carvedilol (COREG) 12.5 MG  tablet Take 1 tablet (12.5 mg total) by mouth 2 (two) times daily. 07/02/18 09/30/18  Rosalio MacadamiaGerhardt, Lori C, NP  fenofibrate 160 MG tablet Take 1 tablet (160 mg total) by mouth daily. 07/02/18   Rosalio MacadamiaGerhardt, Lori C, NP  furosemide (LASIX) 40 MG tablet Take 40 mg by mouth.    [provider]  losartan (COZAAR) 100 MG tablet Take 1 tablet (100 mg total) by mouth daily. 07/31/18   Rosalio MacadamiaGerhardt, Lori C, NP  nitroGLYCERIN (NITROSTAT) 0.4 MG SL tablet Place 1 tablet (0.4 mg total) under the tongue every 5 (five) minutes as needed for chest pain (3 x). 01/29/18   Wendall StadeNishan, Peter C, MD  spironolactone (ALDACTONE) 25 MG tablet Take 1 tablet (25 mg total) by mouth 2 (two) times daily. 09/05/18   Wendall StadeNishan, Peter C, MD  ticagrelor (BRILINTA) 90 MG TABS tablet Take 1 tablet (90 mg total) by mouth 2 (two) times daily. 07/02/18   Rosalio MacadamiaGerhardt, Lori C, NP    No Known Allergies  Family History  Problem Relation Age of Onset  . Arrhythmia Mother        MOTHER LIVED TO BE 89  . Coronary artery disease Mother   . Heart attack Mother   . Hypertension Mother   . Coronary artery disease Father 72  . Heart disease Father   . Heart attack Father   . Heart attack Brother   . Stroke Neg Hx     Social History Social History   Tobacco Use  . Smoking status: Former Smoker    Packs/day: 1.00    Years: 24.00    Pack years: 24.00    Last attempt to quit: 11/03/2016    Years since quitting: 2.1  . Smokeless tobacco: Never Used  . Tobacco comment: quit on 03/15/16  Substance Use Topics  . Alcohol use: No  . Drug use: Yes    Types: Marijuana    Comment: last time 02/2016    Review of Systems Constitutional: Negative for fever Cardiovascular: Intermittent chest discomfort, none currently Respiratory: Intermittent mild shortness of breath, none currently Gastrointestinal: Negative for abdominal pain, vomiting Musculoskeletal: Negative for musculoskeletal complaints Skin: Negative for skin complaints  Neurological:  Negative for headache All other ROS negative  ____________________________________________   PHYSICAL EXAM:  VITAL SIGNS: ED Triage Vitals  Enc Vitals Group     BP 12/22/18 1800 134/82     Pulse Rate 12/22/18 1800 77     Resp 12/22/18 1800 16     Temp 12/22/18 1800 98.3 F (36.8 C)     Temp Source 12/22/18 1800 Oral     SpO2 12/22/18 1800 100 %     Weight 12/22/18 1756 280 lb (127 kg)     Height 12/22/18 1756 5\' 7"  (1.702 m)     Head Circumference --      Peak Flow --      Pain Score 12/22/18 1756 0     Pain Loc --      Pain Edu? --      Excl. in GC? --    Constitutional: Alert and oriented. Well appearing and in no distress. Eyes: Normal exam ENT      Head: Normocephalic and atraumatic.      Mouth/Throat: Mucous membranes are moist. Cardiovascular: Normal rate, regular rhythm.  Respiratory: Normal respiratory effort without tachypnea nor retractions. Breath sounds are clear Gastrointestinal: Soft and nontender. No distention.  Musculoskeletal: Nontender with normal range of motion in all extremities. No lower extremity tenderness or edema. Neurologic:  Normal speech and language. No gross focal neurologic deficits Skin:  Skin is warm, dry and intact.  Psychiatric: Mood and affect are normal.  ____________________________________________    EKG  EKG viewed and interpreted by myself shows a sinus rhythm at 74 bpm with a narrow QRS, right axis deviation, normal intervals.  Nonspecific ST changes no ST elevation.  Occasional PVC.  ____________________________________________    RADIOLOGY  Chest x-ray is negative  ____________________________________________   INITIAL IMPRESSION / ASSESSMENT AND PLAN / ED COURSE  Pertinent labs & imaging results that were available during my care of the patient were reviewed by me and considered in my medical decision making (see chart for details).   Patient presents to the emergency department for intermittent chest  discomfort over the past 2 to 3 days, denies any currently.  However given the patient's history he was concerned so he came to the emergency department for evaluation.  Overall the patient appears very well, no distress, denies any chest  discomfort currently.  Denies any fever cough or congestion at any point.  Patient's labs have resulted largely within normal limits, troponin is negative, chest x-ray and EKG are reassuring.  I discussed with the patient following up with his cardiologist on Monday but with a negative work-up and asymptomatic in the emergency department I believe the patient is safe for discharge home with cardiology follow-up in the near future.  I provided my normal chest pain return precautions.  MUSTAF ANTONACCI was evaluated in Emergency Department on 12/23/2018 for the symptoms described in the history of present illness. He was evaluated in the context of the global COVID-19 pandemic, which necessitated consideration that the patient might be at risk for infection with the SARS-CoV-2 virus that causes COVID-19. Institutional protocols and algorithms that pertain to the evaluation of patients at risk for COVID-19 are in a state of rapid change based on information released by regulatory bodies including the CDC and federal and state organizations. These policies and algorithms were followed during the patient's care in the ED.  ____________________________________________   FINAL CLINICAL IMPRESSION(S) / ED DIAGNOSES  Chest pain   Minna Antis, MD 12/23/18 [phone]

## 2018-12-23 NOTE — ED Notes (Signed)
MD at bedside. 

## 2019-03-19 NOTE — Progress Notes (Deleted)
Cardiology Office Note   Date:  03/19/2019   ID:  Peter Russo Selvy, DOB [birth year], MRN [id number]  PCP:  Hoy RegisterNewlin, Enobong, MD  Cardiologist:  Dr. Eden EmmsNishan    No chief complaint on file.     History of Present Illness: Peter Russo Betker is a 43 y.o. male who presents for ***  CAD and Ischemic DCM.  CRF;s include HTN, HLD and DM-2. Has had multiple interventions to LAD most recently apical LAD in 2017. Last cath August 2017 with SEMI troponin 3.38 patent stents no culprits and known CTO of RCA. Sedentary and still smoking Has had dyspnea over the winter   TTE 07/30/18 EF 20-25% grade 2 diastolic  Mild MR   Seen by NP and losartan increased to 100 mg  QRS is narrow and he would not benefit fro BiV pacing   He still is functional class 3 Not working Has medicare/medicaid and not clear that he can afford Entresto Has no scale at home   Comes in today. Here with his fiance'. He does not know any of his medicines but is here to "see what I am suppose to be on".  Says he has been to the ER twice due to shortness of breath - says that is better now. He had bloating of his abdomen as well - no real peripheral edema. He is on Lasix. He brought in his medicines that he says he is taking. Does not correlate with our list. Tells me he is taking his Aldactone every day - not "prn". He has a bottle of Brilinta. Not on fenofibrate - unclear why. He denies chest pain. Smoking "some". No real exercise. November BNP was 285 CXR CE no acute CHF   In Jan entresto and aldactone.  MRI in 3 months    Past Medical History:  Diagnosis Date  . Anxiety   . CAD S/P percutaneous coronary angioplasty    a. s/p BMS-mLAD 2010. b. DES to RCA 2012. c. 04/2015: DES to LCx and PCI to LAD c/b dissection with subsequent overlapping DES to mLAD - r/i for NSTEMI afterwards; d. 08/2015 Ant STEMI in setting of noncompliance->16600mLAD ISR (3.0x16 Synergy DES); Russo. 03/2016 PCI of apical LAD; f. 03/2016 Relook Cath: patent  LAD stents, RCA 100p CTO-->Med Rx; g. 10/2016 Ant STEMI: PTCA 100p LAD.  Marland Kitchen. Chronic combined systolic and diastolic CHF (congestive heart failure) (HCC)    a. 03/2016 Echo: EF 30-35%, Gr2 DD;  b. 08/2016 Echo: EF 40%.  . Depression   . Essential hypertension   . GAD (generalized anxiety disorder)   . GERD (gastroesophageal reflux disease)   . Hypercholesteremia   . Ischemic cardiomyopathy    a. prior EF 25-35 percent at cath, 35-40 percent by echo; b. 03/2016 Echo: EF 30-35%, diff HK, Gr2 DD; c. 08/2016 Echo: EF 40%, base/mid inferior/inferowseptal AK, mildly dil LA.  . Morbid obesity (HCC)   . Pre-diabetes   . SVT (supraventricular tachycardia) (HCC) APRROX 10 YRS AGO   PRESUMED AVNRT, ECHO 12/07 WITH EF 65%, MILD LAE  . Tobacco abuse    a. 20 + pack years, quit 10/2016.    Past Surgical History:  Procedure Laterality Date  . CARDIAC CATHETERIZATION N/A 04/23/2015   Procedure: Left Heart Cath and Coronary Angiography;  Surgeon: Wendall StadePeter C Nishan, MD;  [facility];  Service: Cardiovascular;  Laterality: N/A;  . CARDIAC CATHETERIZATION N/A 04/23/2015   Procedure: Coronary Stent Intervention;  Surgeon: Tonny BollmanMichael Cooper, MD;  [facility];  Service: Cardiovascular;  Laterality: N/A;  . CARDIAC CATHETERIZATION N/A 09/14/2015   Procedure: Left Heart Cath and Coronary Angiography;  Surgeon: Belva Crome, MD;  LAD 100%, CFX 10% ISR, RCA 100% (chronic), EF 25-35%  . CARDIAC CATHETERIZATION N/A 09/14/2015   Procedure: Coronary Stent Intervention;  Surgeon: Belva Crome, MD;  Location: [place] CV LAB;  Service: Cardiovascular;  Laterality: N/A; 3.0 x 16 mm Synergy DES LAD  . CARDIAC CATHETERIZATION  03/16/2016  . CARDIAC CATHETERIZATION N/A 03/16/2016   Procedure: Left Heart Cath and Coronary Angiography;  Surgeon: Belva Crome, MD;  Location: [place] CV LAB;  Service: Cardiovascular;  Laterality: N/A;  . CARDIAC CATHETERIZATION N/A 03/16/2016   Procedure: Coronary Balloon  Angioplasty;  Surgeon: Belva Crome, MD;  Location: [place] CV LAB;  Service: Cardiovascular;  Laterality: N/A;  . CARDIAC CATHETERIZATION N/A 03/25/2016   Procedure: Left Heart Cath and Coronary Angiography;  Surgeon: Peter M Martinique, MD;  Location: [place] CV LAB;  Service: Cardiovascular;  Laterality: N/A;  . CORONARY ANGIOPLASTY WITH STENT PLACEMENT  2010; 2013  . CORONARY BALLOON ANGIOPLASTY N/A 10/19/2016   Procedure: Coronary Balloon Angioplasty;  Surgeon: Jettie Booze, MD;  Location: [place] CV LAB;  Service: Cardiovascular;  Laterality: N/A;  . INTRAVASCULAR ULTRASOUND/IVUS N/A 10/19/2016   Procedure: Intravascular Ultrasound/IVUS;  Surgeon: Jettie Booze, MD;  Location: [place] CV LAB;  Service: Cardiovascular;  Laterality: N/A;  . LEFT HEART CATH AND CORONARY ANGIOGRAPHY N/A 10/19/2016   Procedure: Left Heart Cath and Coronary Angiography;  Surgeon: Jettie Booze, MD;  Location: [place] CV LAB;  Service: Cardiovascular;  Laterality: N/A;     Current Outpatient Medications  Medication Sig Dispense Refill  . acetaminophen (TYLENOL) 325 MG tablet Take 2 tablets (650 mg total) by mouth every 4 (four) hours as needed for headache or mild pain.    Marland Kitchen aspirin 81 MG EC tablet Take 1 tablet (81 mg total) by mouth daily. 90 tablet 3  . atorvastatin (LIPITOR) 80 MG tablet Take 80 mg by mouth every evening.    Marland Kitchen buPROPion (WELLBUTRIN XL) 150 MG 24 hr tablet Take 150 mg by mouth 2 (two) times daily.    Marland Kitchen buPROPion (ZYBAN) 150 MG 12 hr tablet TAKE 1 TABLET BY MOUTH TWICE A DAY 60 tablet 10  . carvedilol (COREG) 12.5 MG tablet Take 1 tablet (12.5 mg total) by mouth 2 (two) times daily. 180 tablet 3  . fenofibrate 160 MG tablet Take 1 tablet (160 mg total) by mouth daily. 90 tablet 3  . furosemide (LASIX) 40 MG tablet Take 40 mg by mouth.    . losartan (COZAAR) 100 MG tablet Take 1 tablet (100 mg total) by mouth daily. 90 tablet 0  . nitroGLYCERIN (NITROSTAT) 0.4 MG  SL tablet Place 1 tablet (0.4 mg total) under the tongue every 5 (five) minutes as needed for chest pain (3 x). 25 tablet 5  . spironolactone (ALDACTONE) 25 MG tablet Take 1 tablet (25 mg total) by mouth 2 (two) times daily. 180 tablet 3  . ticagrelor (BRILINTA) 90 MG TABS tablet Take 1 tablet (90 mg total) by mouth 2 (two) times daily. 180 tablet 3   No current facility-administered medications for this visit.     Allergies:   Patient has no known allergies.    Social History:  The patient  reports that he quit smoking about 2 years ago. He has a 24.00 pack-year smoking history. He  has never used smokeless tobacco. He reports current drug use. Drug: Marijuana. He reports that he does not drink alcohol.   Family History:  The patient's ***family history includes Arrhythmia in his mother; Coronary artery disease in his mother; Coronary artery disease (age of onset: 4754) in his father; Heart attack in his brother, father, and mother; Heart disease in his father; Hypertension in his mother.    ROS:  General:no colds or fevers, no weight changes Skin:no rashes or ulcers HEENT:no blurred vision, no congestion CV:see HPI PUL:see HPI GI:no diarrhea constipation or melena, no indigestion GU:no hematuria, no dysuria MS:no joint pain, no claudication Neuro:no syncope, no lightheadedness Endo:no diabetes, no thyroid disease Wt Readings from Last 3 Encounters:  12/22/18 280 lb (127 kg)  09/05/18 282 lb (127.9 kg)  07/02/18 279 lb 6.4 oz (126.7 kg)     PHYSICAL EXAM: VS:  There were no vitals taken for this visit. , BMI There is no height or weight on file to calculate BMI. General:Pleasant affect, NAD Skin:Warm and dry, brisk capillary refill HEENT:normocephalic, sclera clear, mucus membranes moist Neck:supple, no JVD, no bruits  Heart:S1S2 RRR without murmur, gallup, rub or click Lungs:clear without rales, rhonchi, or wheezes WUJ:WJXBAbd:soft, non tender, + BS, do not palpate liver spleen or  masses Ext:no lower ext edema, 2+ pedal pulses, 2+ radial pulses Neuro:alert and oriented, MAE, follows commands, + facial symmetry    EKG:  EKG is ordered today. The ekg ordered today demonstrates ***   Recent Labs: 06/27/2018: B Natriuretic Peptide 222.0 07/02/2018: ALT 22; TSH 1.440 09/26/2018: NT-Pro BNP 246 12/22/2018: BUN 15; Creatinine, Ser 1.14; Hemoglobin 15.0; Platelets 231; Potassium 4.0; Sodium 139    Lipid Panel    Component Value Date/Time   CHOL 241 (H) 07/02/2018 1011   TRIG 164 (H) 07/02/2018 1011   HDL 44 07/02/2018 1011   CHOLHDL 5.5 (H) 07/02/2018 1011   CHOLHDL 8.5 10/19/2016 1312   VLDL 63 (H) 10/19/2016 1312   LDLCALC 164 (H) 07/02/2018 1011       Other studies Reviewed: Additional studies/ records that were reviewed today include: ***.   ASSESSMENT AND PLAN:  1.  ***   Current medicines are reviewed with the patient today.  The patient Has no concerns regarding medicines.  The following changes have been made:  See above Labs/ tests ordered today include:see above  Disposition:   FU:  see above  Signed, Nada BoozerLaura Nephi Savage, NP  03/19/2019 8:39 AM    [facility] [address], [place], [place]  [address]/ [address] [place], [place] Phone: [phone]; Fax: [phone]  [phone]

## 2019-03-21 ENCOUNTER — Ambulatory Visit: Payer: Medicare Other | Admitting: Cardiology

## 2019-03-23 ENCOUNTER — Other Ambulatory Visit: Payer: Self-pay | Admitting: Nurse Practitioner

## 2019-03-23 ENCOUNTER — Other Ambulatory Visit: Payer: Self-pay | Admitting: Cardiovascular Disease

## 2019-03-25 ENCOUNTER — Encounter: Payer: Self-pay | Admitting: Family Medicine

## 2019-03-25 ENCOUNTER — Ambulatory Visit: Payer: Medicare Other | Attending: Family Medicine | Admitting: Family Medicine

## 2019-03-25 ENCOUNTER — Other Ambulatory Visit: Payer: Self-pay

## 2019-03-25 DIAGNOSIS — I2510 Atherosclerotic heart disease of native coronary artery without angina pectoris: Secondary | ICD-10-CM

## 2019-03-25 DIAGNOSIS — R7303 Prediabetes: Secondary | ICD-10-CM

## 2019-03-25 DIAGNOSIS — F411 Generalized anxiety disorder: Secondary | ICD-10-CM | POA: Diagnosis not present

## 2019-03-25 DIAGNOSIS — F17210 Nicotine dependence, cigarettes, uncomplicated: Secondary | ICD-10-CM

## 2019-03-25 DIAGNOSIS — I255 Ischemic cardiomyopathy: Secondary | ICD-10-CM | POA: Diagnosis not present

## 2019-03-25 DIAGNOSIS — Z9861 Coronary angioplasty status: Secondary | ICD-10-CM

## 2019-03-25 DIAGNOSIS — Z720 Tobacco use: Secondary | ICD-10-CM

## 2019-03-25 NOTE — Progress Notes (Signed)
Patient has been called and DOB has been verified. Patient has been screened and transferred to PCP to start phone visit.     

## 2019-03-25 NOTE — Progress Notes (Signed)
Virtual Visit via Telephone Note  I connected with Peter Russo, on 03/25/2019 at 10:07 AM by telephone due to the COVID-19 pandemic and verified that I am speaking with the correct person using two identifiers.   Consent: I discussed the limitations, risks, security and privacy concerns of performing an evaluation and management service by telephone and the availability of in person appointments. I also discussed with the patient that there may be a patient responsible charge related to this service. The patient expressed understanding and agreed to proceed.   Location of Patient: In the car  Location of Provider: Clinic   Persons participating in Telemedicine visit: Dason Mosley Farrington-CMA Dr. Felecia Shelling     History of Present Illness: Peter Russo is a 43 year old male with a history of CHF (EF 20-25% from 07/2018), coronary artery disease (s/p stent), prediabetes, hyperlipidemia, obesity. He was seen at the ED at [facility] with chest pains, troponins were negative, EKG revealed sinus rhythm, PVCs, biatrial enlargement right axis deviation. He was diagnosed with anxiety attack but states he has not had any repeat episodes then. On further questioning he endorses worrying a lot as he has a lot on his mind and would like to have a counseling session but is not open to medications.  Last visit with cardiology was in 08/2018 and he denies chest pain, dyspnea, paroxysmal nocturnal dyspnea, weight gain. He tries to exercise at the gyms have been closed. He smokes half a pack of cigarettes a day (he had previously quit smoking But then resumed smoking again after his mother passed away in 12-19-2018).  Past Medical History:  Diagnosis Date  . Anxiety   . CAD S/P percutaneous coronary angioplasty    a. s/p BMS-mLAD 2008/12/18. b. DES to RCA 19-Dec-2010. c. 04/2015: DES to LCx and PCI to LAD c/b dissection with subsequent overlapping DES to mLAD - r/i for  NSTEMI afterwards; d. 08/2015 Ant STEMI in setting of noncompliance->187mLAD ISR (3.0x16 Synergy DES); e. 03/2016 PCI of apical LAD; f. 03/2016 Relook Cath: patent LAD stents, RCA 100p CTO-->Med Rx; g. 10/2016 Ant STEMI: PTCA 100p LAD.  Marland Kitchen Chronic combined systolic and diastolic CHF (congestive heart failure) ([place])    a. 03/2016 Echo: EF 30-35%, Gr2 DD;  b. 08/2016 Echo: EF 40%.  . Depression   . Essential hypertension   . GAD (generalized anxiety disorder)   . GERD (gastroesophageal reflux disease)   . Hypercholesteremia   . Ischemic cardiomyopathy    a. prior EF 25-35 percent at cath, 35-40 percent by echo; b. 03/2016 Echo: EF 30-35%, diff HK, Gr2 DD; c. 08/2016 Echo: EF 40%, base/mid inferior/inferowseptal AK, mildly dil LA.  . Morbid obesity ([place])   . Pre-diabetes   . SVT (supraventricular tachycardia) (H[facility]) APRROX 10 YRS AGO   PRESUMED AVNRT, ECHO 12/07 WITH EF 65%, MILD LAE  . Tobacco abuse    a. 20 + pack years, quit 10/2016.   No Known Allergies  Current Outpatient Medications on File Prior to Visit  Medication Sig Dispense Refill  . acetaminophen (TYLENOL) 325 MG tablet Take 2 tablets (650 mg total) by mouth every 4 (four) hours as needed for headache or mild pain.    Marland Kitchen aspirin 81 MG EC tablet Take 1 tablet (81 mg total) by mouth daily. 90 tablet 3  . atorvastatin (LIPITOR) 80 MG tablet Take 80 mg by mouth every evening.    Marland Kitchen buPROPion (WELLBUTRIN XL) 150 MG 24 hr tablet Take 150 mg  by mouth 2 (two) times daily.    Marland Kitchen. buPROPion (ZYBAN) 150 MG 12 hr tablet TAKE 1 TABLET BY MOUTH TWICE A DAY 60 tablet 10  . fenofibrate 160 MG tablet Take 1 tablet (160 mg total) by mouth daily. 90 tablet 3  . furosemide (LASIX) 40 MG tablet Take 40 mg by mouth.    . losartan (COZAAR) 100 MG tablet TAKE 1 TABLET BY MOUTH EVERY DAY 90 tablet 1  . nitroGLYCERIN (NITROSTAT) 0.4 MG SL tablet Place 1 tablet (0.4 mg total) under the tongue every 5 (five) minutes as needed for chest pain (3 x). 25 tablet 5  .  spironolactone (ALDACTONE) 25 MG tablet Take 1 tablet (25 mg total) by mouth 2 (two) times daily. 180 tablet 3  . ticagrelor (BRILINTA) 90 MG TABS tablet Take 1 tablet (90 mg total) by mouth 2 (two) times daily. 180 tablet 3  . carvedilol (COREG) 12.5 MG tablet Take 1 tablet (12.5 mg total) by mouth 2 (two) times daily. 180 tablet 3   No current facility-administered medications on file prior to visit.     Observations/Objective: Awake, alert, oriented x3 Not in acute distress  CMP Latest Ref Rng & Units 12/22/2018 09/26/2018 09/05/2018  Glucose 70 - 99 mg/dL 161(W180(H) 960(A123(H) 540(J135(H)  BUN 6 - 20 mg/dL 15 11 10   Creatinine 0.61 - 1.24 mg/dL 8.111.14 9.141.22 7.821.15  Sodium 135 - 145 mmol/L 139 138 139  Potassium 3.5 - 5.1 mmol/L 4.0 4.4 4.1  Chloride 98 - 111 mmol/L 105 100 100  CO2 22 - 32 mmol/L 25 24 23   Calcium 8.9 - 10.3 mg/dL 9.4 9.1 9.0  Total Protein 6.0 - 8.5 g/dL - - -  Total Bilirubin 0.0 - 1.2 mg/dL - - -  Alkaline Phos 39 - 117 IU/L - - -  AST 0 - 40 IU/L - - -  ALT 0 - 44 IU/L - - -    Lab Results  Component Value Date   HGBA1C 6.1 (H) 10/19/2016    Assessment and Plan: 1. Ischemic cardiomyopathy EF of 20 to 25% Euvolemic at this time Continue ARB, beta-blocker, Aldactone Daily weights Follow-up with cardiology  2. Tobacco abuse Spent 3 minutes counseling on cessation and he is not ready to quit at this time  3. GAD (generalized anxiety disorder) Declines initiation of medications Will benefit from psychotherapy - Ambulatory referral to Social Work  4. CAD S/P percutaneous coronary angioplasty No angina at this time Risk factor modification  5. Pre-diabetes Last A1c was 6.1 in 10/2016 He needs to come in for an office visit    Follow Up Instructions: Return in about 3 months (around 06/25/2019) for Medical conditions.    I discussed the assessment and treatment plan with the patient. The patient was provided an opportunity to ask questions and all were answered.  The patient agreed with the plan and demonstrated an understanding of the instructions.   The patient was advised to call back or seek an in-person evaluation if the symptoms worsen or if the condition fails to improve as anticipated.     I provided 14 minutes total of non-face-to-face time during this encounter including median intraservice time, reviewing previous notes, labs, imaging, medications, management and patient verbalized understanding.     Hoy RegisterEnobong Hever Castilleja, MD, FAAFP. [facility] [place], [place] [phone]   03/25/2019, 10:07 AM

## 2019-04-09 ENCOUNTER — Other Ambulatory Visit: Payer: Self-pay | Admitting: Cardiovascular Disease

## 2019-04-16 ENCOUNTER — Encounter: Payer: Self-pay | Admitting: Licensed Clinical Social Worker

## 2019-04-16 NOTE — Progress Notes (Signed)
Call placed to patient. LCSW introduced self and explained role at [facility]. Pt was informed of consult from PCP to assess for psychotherapy.   Pt reports chronic medical conditions that have negatively impacted his mental health. Symptoms of anxiety include racing thoughts, panic attacks, and worry about the future. Pt reports extensive family hx of heart conditions and loved ones passing around the age of 74.   LCSW discussed correlation between one's physical and mental health. Validation and support was provided. Pt has a strong support system that consists of wife and two daughters (71 and 50 yo)  Healthy coping skills and strategies to improve motivation were discussed. Pt is open to scheduling an appointment with LCSW in a few weeks to assist in the management of mental health. LCSW encouraged pt to review his schedule and will contact pt in a week to schedule appointment. No additional concerns noted.

## 2019-07-04 ENCOUNTER — Telehealth: Payer: Self-pay | Admitting: Cardiovascular Disease

## 2019-07-04 NOTE — Telephone Encounter (Signed)
Informed Peter Russo that [employer] are not able to release Hospital Records, and for Peter Russo to call the hospital Med Rec Dept. He is aware and has left message with that dept. Peter Russo will be helping with his med rec from this office asap.

## 2019-07-04 NOTE — Telephone Encounter (Signed)
Patient is requesting medical records for his disability application. Also, he would like to know if he can get his medical records from the hospital through [place]. Please advise.

## 2019-08-17 ENCOUNTER — Other Ambulatory Visit: Payer: Self-pay | Admitting: Nurse Practitioner

## 2019-08-22 ENCOUNTER — Telehealth: Payer: Self-pay | Admitting: Cardiovascular Disease

## 2019-08-22 MED ORDER — FUROSEMIDE 40 MG PO TABS: 40.0000 mg | ORAL_TABLET | Freq: Every day | ORAL | 0 refills | Status: DC

## 2019-08-22 NOTE — Telephone Encounter (Signed)
New Message   *STAT* If patient is at the pharmacy, call can be transferred to refill team.   1. Which medications need to be refilled? (please list name of each medication and dose if known) furosemide (LASIX) 40 MG tablet  2. Which pharmacy/location (including street and city if local pharmacy) is medication to be sent to? [id number] - [place], [place] - [address]  3. Do they need a 30 day or 90 day supply? 30  Patient is out of medication and needs today.

## 2019-08-22 NOTE — Telephone Encounter (Signed)
Pt's medication was sent to pt's pharmacy as requested. Confirmation received.  °

## 2019-08-26 ENCOUNTER — Telehealth: Payer: Self-pay | Admitting: *Deleted

## 2019-08-26 NOTE — Progress Notes (Signed)
Virtual Visit via Telephone Note   This visit type was conducted due to national recommendations for restrictions regarding the COVID-19 Pandemic (e.g. social distancing) in an effort to limit this patient's exposure and mitigate transmission in our community.  Due to his co-morbid illnesses, this patient is at least at moderate risk for complications without adequate follow up.  This format is felt to be most appropriate for this patient at this time.  The patient did not have access to video technology/had technical difficulties with video requiring transitioning to audio format only (telephone).  All issues noted in this document were discussed and addressed.  No physical exam could be performed with this format.  Please refer to the patient's chart for his  consent to telehealth for [facility].   Date:  08/27/2019   ID:  Peter BloomMark E Russo, DOB 1975/11/23, MRN [id number]  Patient Location: Home Provider Location: Office  PCP:  Hoy RegisterNewlin, Enobong, MD  Cardiologist:  Charlton HawsPeter Nishan, MD  Electrophysiologist:  None   Evaluation Performed:  Follow-Up Visit  Chief Complaint:  Cardiomyopathy and CAD.  History of Present Illness:    Peter BloomMark E Russo is a 44 y.o. male with cardiomyopathy   He has a hx of CAD and Ischemic DCM.  CRF;s include HTN, HLD and DM-2. Has had multiple interventions to LAD most recently apical LAD in 2017. Last cath August 2017 with  SEMI troponin 3.38 patent stents no culprits and known CTO of RCA. Sedentary and still smoking Has had dyspnea over the winter   TTE 07/30/18 EF 20-25% grade 2 diastolic  Mild MR   Seen by NP and losartan increased to 100 mg  QRS is narrow and he would not benefit fro BiV pacing   He still is functional class 3 Not working Has medicare/medicaid and not clear that he can afford Entresto Has no scale at home or BP cuff.   Last visit 08/2018.  He does not know any of his medicines but is here to "see what I am suppose to be on".   Says he has been to the ER twice due to shortness of breath - says that is better now. He had bloating of his abdomen as well - no real peripheral edema. He is on Lasix. He brought in his medicines that he says he is taking. Does not correlate with our list. Tells me he is taking his Aldactone every day - not "prn". He has a bottle of Brilinta. Not on fenofibrate - unclear why. He denies chest pain. Smoking "some". No real exercise. November BNP was 285 CXR CE no acute CHF   MRI cardiac to assess EF and scar burden regarding AICD  Tobacco use down to 2-3 per day, he had stopped but with his mother's death he started back.    In ER in 12/2018 with chest pain, neg troponin pro BNP 246 - he tells me today it was a panic attack due to his mother's death.  He has had 2 significant attacks and some mild once but now he knows what it is, he is doing better.    Today no angina and no SOB unless he climbs steps at times.  No wt gain but he tells me this is an estimate. At the dentist they told him his BP was good.   He tells me he is taking his meds.  He was exercising but with COVID this has decreased.    The patient does not have symptoms concerning for COVID-19  infection (fever, chills, cough, or new shortness of breath).    Past Medical History:  Diagnosis Date   Anxiety    CAD S/P percutaneous coronary angioplasty    a. s/p BMS-mLAD 2010. b. DES to RCA 2012. c. 04/2015: DES to LCx and PCI to LAD c/b dissection with subsequent overlapping DES to mLAD - r/i for NSTEMI afterwards; d. 08/2015 Ant STEMI in setting of noncompliance->179mLAD ISR (3.0x16 Synergy DES); e. 03/2016 PCI of apical LAD; f. 03/2016 Relook Cath: patent LAD stents, RCA 100p CTO-->Med Rx; g. 10/2016 Ant STEMI: PTCA 100p LAD.   Chronic combined systolic and diastolic CHF (congestive heart failure) ([place])    a. 03/2016 Echo: EF 30-35%, Gr2 DD;  b. 08/2016 Echo: EF 40%.   Depression    Essential hypertension    GAD (generalized anxiety  disorder)    GERD (gastroesophageal reflux disease)    Hypercholesteremia    Ischemic cardiomyopathy    a. prior EF 25-35 percent at cath, 35-40 percent by echo; b. 03/2016 Echo: EF 30-35%, diff HK, Gr2 DD; c. 08/2016 Echo: EF 40%, base/mid inferior/inferowseptal AK, mildly dil LA.   Morbid obesity ([place])    Pre-diabetes    SVT (supraventricular tachycardia) ([place]) APRROX 10 YRS AGO   PRESUMED AVNRT, ECHO 12/07 WITH EF 65%, MILD LAE   Tobacco abuse    a. 20 + pack years, quit 10/2016.   Past Surgical History:  Procedure Laterality Date   CARDIAC CATHETERIZATION N/A 04/23/2015   Procedure: Left Heart Cath and Coronary Angiography;  Surgeon: Josue Hector, MD;  Location: [place] [facility];  Service: Cardiovascular;  Laterality: N/A;   CARDIAC CATHETERIZATION N/A 04/23/2015   Procedure: Coronary Stent Intervention;  Surgeon: Sherren Mocha, MD;  Location: [place] [facility];  Service: Cardiovascular;  Laterality: N/A;   CARDIAC CATHETERIZATION N/A 09/14/2015   Procedure: Left Heart Cath and Coronary Angiography;  Surgeon: Belva Crome, MD;  LAD 100%, CFX 10% ISR, RCA 100% (chronic), EF 25-35%   CARDIAC CATHETERIZATION N/A 09/14/2015   Procedure: Coronary Stent Intervention;  Surgeon: Belva Crome, MD;  Location: [place] [facility];  Service: Cardiovascular;  Laterality: N/A; 3.0 x 16 mm Synergy DES LAD   CARDIAC CATHETERIZATION  03/16/2016   CARDIAC CATHETERIZATION N/A 03/16/2016   Procedure: Left Heart Cath and Coronary Angiography;  Surgeon: Belva Crome, MD;  Location: [place] [facility];  Service: Cardiovascular;  Laterality: N/A;   CARDIAC CATHETERIZATION N/A 03/16/2016   Procedure: Coronary Balloon Angioplasty;  Surgeon: Belva Crome, MD;  Location: [place] [facility];  Service: Cardiovascular;  Laterality: N/A;   CARDIAC CATHETERIZATION N/A 03/25/2016   Procedure: Left Heart Cath and Coronary Angiography;  Surgeon: Peter M Martinique, MD;  Location: [place] [facility];  Service:  Cardiovascular;  Laterality: N/A;   CORONARY ANGIOPLASTY WITH STENT PLACEMENT  2010; 2013   CORONARY BALLOON ANGIOPLASTY N/A 10/19/2016   Procedure: Coronary Balloon Angioplasty;  Surgeon: Jettie Booze, MD;  Location: [place] [facility];  Service: Cardiovascular;  Laterality: N/A;   INTRAVASCULAR ULTRASOUND/IVUS N/A 10/19/2016   Procedure: Intravascular Ultrasound/IVUS;  Surgeon: Jettie Booze, MD;  Location: [place] [facility];  Service: Cardiovascular;  Laterality: N/A;   LEFT HEART CATH AND CORONARY ANGIOGRAPHY N/A 10/19/2016   Procedure: Left Heart Cath and Coronary Angiography;  Surgeon: Jettie Booze, MD;  Location: [place] [facility];  Service: Cardiovascular;  Laterality: N/A;     Current Meds  Medication Sig   acetaminophen (  TYLENOL) 325 MG tablet Take 2 tablets (650 mg total) by mouth every 4 (four) hours as needed for headache or mild pain.   aspirin 81 MG EC tablet Take 1 tablet (81 mg total) by mouth daily.   atorvastatin (LIPITOR) 80 MG tablet TAKE 1 TABLET BY MOUTH EVERY DAY AT 6 PM   buPROPion (WELLBUTRIN XL) 150 MG 24 hr tablet Take 150 mg by mouth 2 (two) times daily.   carvedilol (COREG) 12.5 MG tablet Take 1 tablet (12.5 mg total) by mouth 2 (two) times daily.   furosemide (LASIX) 40 MG tablet Take 1 tablet (40 mg total) by mouth daily. Please make overdue appt with Dr. Eden Emms before anymore refills. 1st attempt   losartan (COZAAR) 100 MG tablet TAKE 1 TABLET BY MOUTH EVERY DAY   nitroGLYCERIN (NITROSTAT) 0.4 MG SL tablet PLACE 1 TABLET UNDER TONGUE EVERY 5 MINS, UP TO 3 DOSES AS NEEDED FOR CHEST PAIN   spironolactone (ALDACTONE) 25 MG tablet TAKE 1 TABLET BY MOUTH EVERY DAY   ticagrelor (BRILINTA) 90 MG TABS tablet Take 1 tablet (90 mg total) by mouth 2 (two) times daily.     Allergies:   Patient has no known allergies.   Social History   Tobacco Use   Smoking status: Former Smoker    Packs/day: 1.00    Years: 24.00    Pack years: 24.00     Quit date: 11/03/2016    Years since quitting: 2.8   Smokeless tobacco: Never Used   Tobacco comment: quit on 03/15/16  Substance Use Topics   Alcohol use: No   Drug use: Yes    Types: Marijuana    Comment: last time 02/2016     Family Hx: The patient's family history includes Arrhythmia in his mother; Coronary artery disease in his mother; Coronary artery disease (age of onset: 31) in his father; Heart attack in his brother, father, and mother; Heart disease in his father; Hypertension in his mother. There is no history of Stroke.  ROS:   Please see the history of present illness.    General:no colds or fevers, no weight changes Skin:no rashes or ulcers HEENT:no blurred vision, no congestion CV:see HPI PUL:see HPI GI:no diarrhea constipation or melena, no indigestion GU:no hematuria, no dysuria MS:no joint pain, no claudication Neuro:no syncope, no lightheadedness Endo:no diabetes, no thyroid disease  All other systems reviewed and are negative.   Prior CV studies:   The following studies were reviewed today:  Cardiac cath 10/19/16   Prox Cx to Mid Cx lesion, 10 %stenosed.  Prox RCA lesion, 100 %stenosed. Chronic total occlusion.  2nd Diag lesion, 45 %stenosed.  Prox LAD lesion, 100 %stenosed. This was the culprit lesion and treated with thrombectomy and angioplasty, > 4 mm. IVUS showed the vessel diameter at approximaltely 4 mm.  Post intervention, there is a 0% residual stenosis.  There is no aortic valve stenosis.  LV end diastolic pressure is severely elevated.   He needs aggressive risk factor modification. He needs to be compliant with dual antiplatelet therapy. We elected not to place any additional stents since there were questions about compliance. There was inadequate result with just balloon dilatation with intravascular ultrasound guidance. He states he was taking his Brilinta. In the event that he had missed some doses, we gave a dose of intravenous  tirofiban. He was again loaded with oral Brilinta. He'll need aggressive medical therapy for his systolic dysfunction. Elevated LVEDP. Hold off on IV fluids at this point.  ECHO 07/30/18 Study Conclusions  - Left ventricle: The cavity size was moderately dilated. Systolic   function was severely reduced. The estimated ejection fraction   was in the range of 20% to 25%. Diffuse hypokinesis. Features are   consistent with a pseudonormal left ventricular filling pattern,   with concomitant abnormal relaxation and increased filling   pressure (grade 2 diastolic dysfunction). Doppler parameters are   consistent with elevated ventricular end-diastolic filling   pressure. - Aortic valve: There was no regurgitation. - Mitral valve: There was mild regurgitation. - Left atrium: The atrium was mildly dilated. - Right ventricle: The cavity size was moderately dilated. Wall   thickness was normal. Systolic function was severely reduced. - Tricuspid valve: There was mild regurgitation. - Pulmonary arteries: Systolic pressure was mildly increased. PA   peak pressure: 36 mm Hg (S).  Impressions:  - Moderate biventricular dilatation with severe biventricular   systolic dysfunction. Severely elevated LVEDP. LVEF decreased   from 40% on 09/08/2016 to 20-25%.  Labs/Other Tests and Data Reviewed:    EKG:  An ECG dated 12/22/18 was personally reviewed today and demonstrated:  SR with PVC and non specific ST abnormalities  Recent Labs: 09/26/2018: NT-Pro BNP 246 12/22/2018: BUN 15; Creatinine, Ser 1.14; Hemoglobin 15.0; Platelets 231; Potassium 4.0; Sodium 139   Recent Lipid Panel Lab Results  Component Value Date/Time   CHOL 241 (H) 07/02/2018 10:11 AM   TRIG 164 (H) 07/02/2018 10:11 AM   HDL 44 07/02/2018 10:11 AM   CHOLHDL 5.5 (H) 07/02/2018 10:11 AM   CHOLHDL 8.5 10/19/2016 01:12 PM   LDLCALC 164 (H) 07/02/2018 10:11 AM    Wt Readings from Last 3 Encounters:  08/27/19 280 lb (127 kg)    12/22/18 280 lb (127 kg)  09/05/18 282 lb (127.9 kg)     Objective:    Vital Signs:  Ht 5\' 7"  (1.702 m)    Wt 280 lb (127 kg)    BMI 43.85 kg/m    VITAL SIGNS:  reviewed General NAD Pulmonary can speak in complete sentences without SOB Neuro A&O X 3  Psych: pleasant affect, grieving his mother's death.   ASSESSMENT & PLAN:    1. Ischemic CM with EF 20-25%, seems euvolemic by phone denies edema.  No SOB.  Will proceed with Cardiac MRI for EF and scar burden.  Follow up with Dr. in 2 months, will adjust meds depending on EF 2. CAD, with hx of ant MI (STEMI) no angina  - with cath 10/2016 100% prox LAD occlusion s/p thrombectomy and PCTA. Dr. 11/2016 elected not to place any additional stents since there were questions about compliance at that time.  Known CTO RCA see above for MRI 3. HTN no BP today will try to obtain BP cuff for pt and if not then check BP with labs.  4. HLD check labs 5. Tobacco use, asked him not to increase and over next few months to decrease.     COVID-19 Education: The signs and symptoms of COVID-19 were discussed with the patient and how to seek care for testing (follow up with PCP or arrange E-visit).  The importance of social distancing was discussed today.  Time:   Today, I have spent 20 minutes with the patient with telehealth technology discussing the above problems.     Medication Adjustments/Labs and Tests Ordered: Current medicines are reviewed at length with the patient today.  Concerns regarding medicines are outlined above.   Tests Ordered: No orders of  the defined types were placed in this encounter.   Medication Changes: No orders of the defined types were placed in this encounter.   Follow Up:  In Person in 2 month(s)  Signed, [name], NP  08/27/2019 8:21 AM    [facility]

## 2019-08-26 NOTE — Telephone Encounter (Signed)
Virtual Visit Pre-Appointment Phone Call  "(Name), I am calling you today to discuss your upcoming appointment. We are currently trying to limit exposure to the virus that causes COVID-19 by seeing patients at home rather than in the office."  1. "What is the BEST phone number to call the day of the visit?" - include this in appointment [phone]  2. "Do you have or have access to (through a family member/friend) a smartphone with video capability that we can use for your visit?" yes a. If yes - list this number in appt notes as "cell" (if different from BEST phone #) and list the appointment type as a VIDEO visit in appointment notes b. If no - list the appointment type as a PHONE visit in appointment notes  3. Confirm consent - "In the setting of the current Covid19 crisis, you are scheduled for a  phone visit with your provider on January 12,2021.  Just as we do with many in-office visits, in order for you to participate in this visit, we must obtain consent.  If you'd like, I can send this to your mychart (if signed up) or email for you to review.  Otherwise, I can obtain your verbal consent now.  All virtual visits are billed to your insurance company just like a normal visit would be.  By agreeing to a virtual visit, we'd like you to understand that the technology does not allow for your provider to perform an examination, and thus may limit your provider's ability to fully assess your condition. If your provider identifies any concerns that need to be evaluated in person, we will make arrangements to do so.  Finally, though the technology is pretty good, we cannot assure that it will always work on either your or our end, and in the setting of a video visit, we may have to convert it to a phone-only visit.  In either situation, we cannot ensure that we have a secure connection.  Are you willing to proceed?" STAFF: Did the patient verbally acknowledge consent to telehealth visit?  Document YES/NO here: yes  4. Advise patient to be prepared - "Two hours prior to your appointment, go ahead and check your blood pressure, pulse, oxygen saturation, and your weight (if you have the equipment to check those) and write them all down. When your visit starts, your provider will ask you for this information. If you have an Apple Watch or Kardia device, please plan to have heart rate information ready on the day of your appointment. Please have a pen and paper handy nearby the day of the visit as well."  5. Give patient instructions for MyChart download to smartphone OR Doximity/Doxy.me as below if video visit (depending on what platform provider is using)  6. Inform patient they will receive a phone call 15 minutes prior to their appointment time (may be from unknown caller ID) so they should be prepared to answer    [place] has been deemed a candidate for a follow-up tele-health visit to limit community exposure during the Covid-19 pandemic. I spoke with the patient via phone to ensure availability of phone/video source, confirm preferred email & phone number, and discuss instructions and expectations.  I reminded Peter Russo to be prepared with any vital sign and/or heart rhythm information that could potentially be obtained via home monitoring, at the time of his visit. I reminded Peter Russo to expect a phone call prior to his  visit.  Genice Rouge, RN 08/26/2019 5:28 PM   INSTRUCTIONS FOR DOWNLOADING THE MYCHART APP TO SMARTPHONE  - The patient must first make sure to have activated MyChart and know their login information - If Apple, go to [employer] and type in MyChart in the search bar and download the app. If Android, ask patient to go to [employer] and type in [place] in the search bar and download the app. The app is free but as with any other app downloads, their phone may require them to verify saved payment  information or Apple/Android password.  - The patient will need to then log into the app with their MyChart username and password, and select [facility] as their healthcare provider to link the account. When it is time for your visit, go to the MyChart app, find appointments, and click Begin Video Visit. Be sure to Select Allow for your device to access the Microphone and Camera for your visit. You will then be connected, and your provider will be with you shortly.  **If they have any issues connecting, or need assistance please contact MyChart service desk [phone])**  **If using a computer, in order to ensure the best quality for their visit they will need to use either of the following Internet Browsers: [employer] or Google Chrome**  IF USING DOXIMITY or DOXY.ME - The patient will receive a link just prior to their visit by text.     FULL LENGTH CONSENT FOR TELE-HEALTH VISIT   I hereby voluntarily request, consent and authorize [facility] and its employed or contracted physicians, physician assistants, nurse practitioners or other licensed health care professionals (the Practitioner), to provide me with telemedicine health care services (the "Services") as deemed necessary by the treating Practitioner. I acknowledge and consent to receive the Services by the Practitioner via telemedicine. I understand that the telemedicine visit will involve communicating with the Practitioner through live audiovisual communication technology and the disclosure of certain medical information by electronic transmission. I acknowledge that I have been given the opportunity to request an in-person assessment or other available alternative prior to the telemedicine visit and am voluntarily participating in the telemedicine visit.  I understand that I have the right to withhold or withdraw my consent to the use of telemedicine in the course of my care at any time, without affecting my right  to future care or treatment, and that the Practitioner or I may terminate the telemedicine visit at any time. I understand that I have the right to inspect all information obtained and/or recorded in the course of the telemedicine visit and may receive copies of available information for a reasonable fee.  I understand that some of the potential risks of receiving the Services via telemedicine include:  Marland Kitchen Delay or interruption in medical evaluation due to technological equipment failure or disruption; . Information transmitted may not be sufficient (e.g. poor resolution of images) to allow for appropriate medical decision making by the Practitioner; and/or  . In rare instances, security protocols could fail, causing a breach of personal health information.  Furthermore, I acknowledge that it is my responsibility to provide information about my medical history, conditions and care that is complete and accurate to the best of my ability. I acknowledge that Practitioner's advice, recommendations, and/or decision may be based on factors not within their control, such as incomplete or inaccurate data provided by me or distortions of diagnostic images or specimens that may result from electronic transmissions. I understand  that the practice of medicine is not an exact science and that Practitioner makes no warranties or guarantees regarding treatment outcomes. I acknowledge that I will receive a copy of this consent concurrently upon execution via email to the email address I last provided but may also request a printed copy by calling the office of [place].    I understand that my insurance will be billed for this visit.   I have read or had this consent read to me. . I understand the contents of this consent, which adequately explains the benefits and risks of the Services being provided via telemedicine.  . I have been provided ample opportunity to ask questions regarding this consent and the Services  and have had my questions answered to my satisfaction. . I give my informed consent for the services to be provided through the use of telemedicine in my medical care  By participating in this telemedicine visit I agree to the above.

## 2019-08-27 ENCOUNTER — Telehealth (INDEPENDENT_AMBULATORY_CARE_PROVIDER_SITE_OTHER): Payer: Medicare Other | Admitting: Cardiology

## 2019-08-27 ENCOUNTER — Other Ambulatory Visit: Payer: Self-pay

## 2019-08-27 ENCOUNTER — Telehealth: Payer: Self-pay | Admitting: Licensed Clinical Social Worker

## 2019-08-27 ENCOUNTER — Encounter: Payer: Self-pay | Admitting: Cardiology

## 2019-08-27 VITALS — Ht 67.0 in | Wt 280.0 lb

## 2019-08-27 DIAGNOSIS — I10 Essential (primary) hypertension: Secondary | ICD-10-CM

## 2019-08-27 DIAGNOSIS — I255 Ischemic cardiomyopathy: Secondary | ICD-10-CM

## 2019-08-27 DIAGNOSIS — I2510 Atherosclerotic heart disease of native coronary artery without angina pectoris: Secondary | ICD-10-CM

## 2019-08-27 DIAGNOSIS — Z79899 Other long term (current) drug therapy: Secondary | ICD-10-CM

## 2019-08-27 DIAGNOSIS — E785 Hyperlipidemia, unspecified: Secondary | ICD-10-CM

## 2019-08-27 NOTE — Patient Instructions (Addendum)
Medication Instructions:  Your physician recommends that you continue on your current medications as directed. Please refer to the Current Medication list given to you today.  *If you need a refill on your cardiac medications before your next appointment, please call your pharmacy*  Lab Work: 08/30/2019:  Come to the office at 11:00 for fasitng Lipid / CMP, you will also see a Nurse for a bp check  If you have labs (blood work) drawn today and your tests are completely normal, you will receive your results only by: Marland Kitchen MyChart Message (if you have MyChart) OR . A paper copy in the mail If you have any lab test that is abnormal or we need to change your treatment, we will call you to review the results.  Testing/Procedures: Your physician has requested that you have a cardiac MRI. (SOMEONE WILL CALL YOU TO SCHEDULE)  Cardiac MRI uses a computer to create images of your heart as its beating, producing both still and moving pictures of your heart and major blood vessels. For further information please visit [email or web address]. Please follow the instruction sheet given to you today for more information.    Follow-Up: At [facility], you and your health needs are our priority.  As part of our continuing mission to provide you with exceptional heart care, we have created designated Provider Care Teams.  These Care Teams include your primary Cardiologist (physician) and Advanced Practice Providers (APPs -  Physician Assistants and Nurse Practitioners) who all work together to provide you with the care you need, when you need it.  Your next appointment:   08/30/2019 ARRIVE AT 10:45 FOR A NURSE VISIT FOR A BLOOD PRESSURE CHECK.  MAKE SURE YOU GO TO THE LAB WHILE YOU ARE AT THE OFFICE  I have asked our office to mail you out a blood pressure cuff to your home.  Your next appointment:   11/04/2019 ARRIVE AT 7:45 FOR REGISTRATION  The format for your next appointment:   In Person  Provider:   Charlton Haws, MD  Other Instructions  Magnetic Resonance Imaging Magnetic resonance imaging (MRI) is a painless test that takes pictures of the inside of your body. This test uses a strong magnet. This test does not use X-rays. What happens before the procedure?  You will be asked about any metal you may have in your body. This includes: ? Any joint replacement (prosthesis), such as an artificial knee or hip. ? Any implanted devices or ports. ? A metallic ear implant (cochlear implant). ? An artificial heart valve. ? A metallic object in the eye. ? Metal splinters. ? Bullet fragments. ? Any tattoos. Some of the darker inks can cause problems with testing.  You will be asked to take off all metal. This includes: ? Your watch, jewelry, and other metal objects. ? Hearing aids. ? Dentures. ? Underwire bra. ? Makeup. Some kinds of makeup contain small amounts of metal. ? Braces and fillings normally are not a problem.  If you are breastfeeding, ask your doctor if you need to pump before your test and then stop breastfeeding for a short time. You may need to do this if dye (contrast material) will be used during your MRI.  If you have a fear of cramped spaces (claustrophobia), you may be given medicines prior to the MRI. What happens during the procedure?   You may be given earplugs or headphones to listen to music. The MRI machine can be noisy.  You will lie down on a  platform. It looks like a long table.  If dye will be used, an IV tube will be placed into one of your veins. Dye will be given through your IV tube at a certain time as images are taken.  The platform will slide into a tunnel. The tunnel has magnets inside of it. When you are inside the tunnel, you will still be able to talk to your doctor.  The tunnel will scan your body and make images. You will be asked to lie very still. Your doctor will tell you when you can move. You may have to wait a few minutes to make sure that  the images from the test are clear.  When all images are taken, the platform will slide out of the tunnel. The procedure may vary among doctors and hospitals. What happens after the procedure?  You may be taken to a recovery area if sedation medicines were used. Your blood pressure, heart rate, breathing rate, and blood oxygen level will be monitored until you leave the hospital or clinic.  If dye was used: ? It will leave your body through your pee (urine). It takes about a day for all of the dye to leave the body. ? You may be told to drink plenty of fluids. This helps your body get rid of the dye. ? If you are breastfeeding, do not breastfeed your child until your doctor says that this is safe.  You may go back to your normal activities right away, or as told by your doctor.  It is up to you to get your test results. Ask your doctor, or the department that is doing the test, when your results will be ready. Summary  Magnetic resonance imaging (MRI) is a test that takes pictures of the inside of your body.  Before your MRI, be sure to tell your doctor about any metal you may have in your body.  You may go back to your normal activities right away, or as told by your doctor. This information is not intended to replace advice given to you by your health care provider. Make sure you discuss any questions you have with your health care provider. Document Revised: 06/26/2017 Document Reviewed: 06/26/2017 Elsevier Patient Education  2020 [employer].

## 2019-08-27 NOTE — Telephone Encounter (Signed)
CSW referred to assist patient with obtaining a BP cuff. CSW contacted patient to inform cuff will be delivered to home. Patient grateful for support and assistance. CSW available as needed. Jackie Neri Samek, LCSW, CCSW-MCS [phone]  

## 2019-08-30 ENCOUNTER — Other Ambulatory Visit: Payer: Self-pay

## 2019-08-30 ENCOUNTER — Ambulatory Visit (INDEPENDENT_AMBULATORY_CARE_PROVIDER_SITE_OTHER): Payer: Medicare Other

## 2019-08-30 ENCOUNTER — Other Ambulatory Visit: Payer: Medicare Other | Admitting: *Deleted

## 2019-08-30 VITALS — BP 138/72 | HR 78 | Wt 278.5 lb

## 2019-08-30 DIAGNOSIS — I255 Ischemic cardiomyopathy: Secondary | ICD-10-CM | POA: Diagnosis not present

## 2019-08-30 DIAGNOSIS — I2510 Atherosclerotic heart disease of native coronary artery without angina pectoris: Secondary | ICD-10-CM

## 2019-08-30 DIAGNOSIS — Z79899 Other long term (current) drug therapy: Secondary | ICD-10-CM | POA: Diagnosis not present

## 2019-08-30 DIAGNOSIS — I10 Essential (primary) hypertension: Secondary | ICD-10-CM

## 2019-08-30 DIAGNOSIS — E785 Hyperlipidemia, unspecified: Secondary | ICD-10-CM | POA: Diagnosis not present

## 2019-08-30 NOTE — Progress Notes (Signed)
1.) Reason for visit: BP check  2.) Name of MD requesting visit: [name], NP *  3.) H&P: History of CAD and Ischemic CM, recent telemed visit reflects pt not checking BP at home, Provider requested he come for BP check and labwork**  4.) ROS related to problem: BP today 138/72, HR 78.  Pt reports he just receieved BP cuff at homebut has not started checking BP.  Pt reportedly feels good, no cardiac complaints**  5.) Assessment and plan per MD: Current BP reviewed by Dr. Elease Hashimoto, DOD.  No changes report results to [name].*

## 2019-08-30 NOTE — Patient Instructions (Signed)
Discharge Instructions   None       

## 2019-08-31 LAB — LIPID PANEL
Chol/HDL Ratio: 5.7 ratio — ABNORMAL HIGH (ref 0.0–5.0)
Cholesterol, Total: 154 mg/dL (ref 100–199)
HDL: 27 mg/dL — ABNORMAL LOW (ref >39)
LDL Chol Calc (NIH): 74 mg/dL (ref 0–99)
Triglycerides: 330 mg/dL — ABNORMAL HIGH (ref 0–149)
VLDL Cholesterol Cal: 53 mg/dL — ABNORMAL HIGH (ref 5–40)

## 2019-08-31 LAB — COMPREHENSIVE METABOLIC PANEL
ALT: 25 IU/L (ref 0–44)
AST: 20 IU/L (ref 0–40)
Albumin/Globulin Ratio: 1.2 (ref 1.2–2.2)
Albumin: 4 g/dL (ref 4.0–5.0)
Alkaline Phosphatase: 106 IU/L (ref 39–117)
BUN/Creatinine Ratio: 12 (ref 9–20)
BUN: 15 mg/dL (ref 6–24)
Bilirubin Total: 0.4 mg/dL (ref 0.0–1.2)
CO2: 26 mmol/L (ref 20–29)
Calcium: 9.1 mg/dL (ref 8.7–10.2)
Chloride: 99 mmol/L (ref 96–106)
Creatinine, Ser: 1.27 mg/dL (ref 0.76–1.27)
GFR calc Af Amer: 78 mL/min/{1.73_m2} (ref >59)
GFR calc non Af Amer: 68 mL/min/{1.73_m2} (ref >59)
Globulin, Total: 3.3 g/dL (ref 1.5–4.5)
Glucose: 127 mg/dL — ABNORMAL HIGH (ref 65–99)
Potassium: 4.4 mmol/L (ref 3.5–5.2)
Sodium: 139 mmol/L (ref 134–144)
Total Protein: 7.3 g/dL (ref 6.0–8.5)

## 2019-09-02 NOTE — Progress Notes (Signed)
Pt has been made aware of normal result and verbalized understanding.  jw  09/02/19

## 2019-09-05 ENCOUNTER — Ambulatory Visit (HOSPITAL_COMMUNITY)
Admission: EM | Admit: 2019-09-05 | Discharge: 2019-09-05 | Disposition: A | Discharge status: Home / Self Care | Payer: Medicare Other | Attending: Family Medicine | Admitting: Family Medicine

## 2019-09-05 ENCOUNTER — Encounter (HOSPITAL_COMMUNITY): Payer: Self-pay | Admitting: Emergency Medicine

## 2019-09-05 ENCOUNTER — Other Ambulatory Visit: Payer: Self-pay

## 2019-09-05 DIAGNOSIS — L089 Local infection of the skin and subcutaneous tissue, unspecified: Secondary | ICD-10-CM | POA: Diagnosis not present

## 2019-09-05 DIAGNOSIS — B9689 Other specified bacterial agents as the cause of diseases classified elsewhere: Secondary | ICD-10-CM

## 2019-09-05 DIAGNOSIS — I10 Essential (primary) hypertension: Secondary | ICD-10-CM

## 2019-09-05 MED ORDER — HYDROCODONE-ACETAMINOPHEN 5-325 MG PO TABS: 1.0000 | ORAL_TABLET | Freq: Four times a day (QID) | ORAL | 0 refills | Status: DC | PRN

## 2019-09-05 MED ORDER — DOXYCYCLINE HYCLATE 100 MG PO CAPS: 100.0000 mg | ORAL_CAPSULE | Freq: Two times a day (BID) | ORAL | 0 refills | Status: DC

## 2019-09-05 NOTE — ED Triage Notes (Signed)
Pt has some redness and swelling to his left lower abdomen and groin that he first noticed two days ago.

## 2019-09-05 NOTE — Discharge Instructions (Addendum)
Be aware, pain medications may cause drowsiness. Please do not drive, operate heavy machinery or make important decisions while on this medication, it can cloud your judgement.  Your blood pressure was noted to be elevated during your visit today. You may return here within the next few days to recheck if unable to see your primary care doctor.  BP (!) 149/82 (BP Location: Right Arm)   Pulse 94   Temp 99 F (37.2 C) (Oral)   Resp (!) 24   SpO2 100%

## 2019-09-05 NOTE — ED Provider Notes (Addendum)
[place]   [id number] 09/05/19 Arrival Time: [place]:  1. Localized bacterial skin infection   2. Essential hypertension     No indication for I&D at this time. Discussed.   Meds ordered this encounter  Medications  . doxycycline (VIBRAMYCIN) 100 MG capsule    Sig: Take 1 capsule (100 mg total) by mouth 2 (two) times daily.    Dispense:  20 capsule    Refill:  0  . HYDROcodone-acetaminophen (NORCO/VICODIN) 5-325 MG tablet    Sig: Take 1 tablet by mouth every 6 (six) hours as needed for moderate pain or severe pain.    Dispense:  8 tablet    Refill:  0     Discharge Instructions     Be aware, pain medications may cause drowsiness. Please do not drive, operate heavy machinery or make important decisions while on this medication, it can cloud your judgement.  Your blood pressure was noted to be elevated during your visit today. You may return here within the next few days to recheck if unable to see your primary care doctor.  BP (!) 149/82 (BP Location: Right Arm)   Pulse 94   Temp 99 F (37.2 C) (Oral)   Resp (!) 24   SpO2 100%       Follow-up Information    Protection.   Specialty: Urgent Care Why: If worsening or failing to improve as anticipated over the next 2-3 days. Contact information: [place]          Will follow up with PCP or here if worsening or failing to improve as anticipated. Reviewed expectations re: course of current medical issues. Questions answered. Outlined signs and symptoms indicating need for more acute intervention. Patient verbalized understanding. After Visit Summary given.   SUBJECTIVE:  Peter Russo is a 44 y.o. male who presents with a skin complaint. Area of redness and pain; L inguinal; noticed 2 d ago; afebrile; no drainage or bleeding; OTC analgesics without relief. No h/o recurrent skin  infections; no injury to area.  Increased blood pressure noted today. Reports that he is treated for HTN. He reports taking medications as instructed, no chest pain on exertion, no dyspnea on exertion, no swelling of ankles, no orthostatic dizziness or lightheadedness, no orthopnea or paroxysmal nocturnal dyspnea, no palpitations and no intermittent claudication symptoms.   OBJECTIVE: Vitals:   09/05/19 1838  BP: (!) 149/82  Pulse: 94  Resp: (!) 24  Temp: 99 F (37.2 C)  TempSrc: Oral  SpO2: 100%    General appearance: alert; no distress HEENT: [place]; AT Neck: supple with FROM Lungs: clear to auscultation bilaterally Heart: regular rate and rhythm Extremities: no edema; moves all extremities normally Skin: warm and dry; approx 2x3 cm area of skin erythema over L inguinal area at waistline; no fluctuance; feels warm; TTP; no drainage or bleeding Psychological: alert and cooperative; normal mood and affect  No Known Allergies  Past Medical History:  Diagnosis Date  . Anxiety   . CAD S/P percutaneous coronary angioplasty    a. s/p BMS-mLAD 2010. b. DES to RCA 2012. c. 04/2015: DES to LCx and PCI to LAD c/b dissection with subsequent overlapping DES to mLAD - r/i for NSTEMI afterwards; d. 08/2015 Ant STEMI in setting of noncompliance->136mLAD ISR (3.0x16 Synergy DES); e. 03/2016 PCI of apical LAD; f. 03/2016 Relook Cath: patent LAD stents, RCA 100p CTO-->Med Rx; g.  10/2016 Ant STEMI: PTCA 100p LAD.  Marland Kitchen Chronic combined systolic and diastolic CHF (congestive heart failure) (HCC)    a. 03/2016 Echo: EF 30-35%, Gr2 DD;  b. 08/2016 Echo: EF 40%.  . Depression   . Essential hypertension   . GAD (generalized anxiety disorder)   . GERD (gastroesophageal reflux disease)   . Hypercholesteremia   . Ischemic cardiomyopathy    a. prior EF 25-35 percent at cath, 35-40 percent by echo; b. 03/2016 Echo: EF 30-35%, diff HK, Gr2 DD; c. 08/2016 Echo: EF 40%, base/mid inferior/inferowseptal AK, mildly dil LA.   . Morbid obesity (HCC)   . Pre-diabetes   . SVT (supraventricular tachycardia) (HCC) APRROX 10 YRS AGO   PRESUMED AVNRT, ECHO 12/07 WITH EF 65%, MILD LAE  . Tobacco abuse    a. 20 + pack years, quit 10/2016.   Social History   Socioeconomic History  . Marital status: Significant Other    Spouse name: Not on file  . Number of children: Not on file  . Years of education: Not on file  . Highest education level: Not on file  Occupational History  . Occupation: Agricultural engineer: [employer]  Tobacco Use  . Smoking status: Former Smoker    Packs/day: 1.00    Years: 24.00    Pack years: 24.00    Quit date: 11/03/2016    Years since quitting: 2.8  . Smokeless tobacco: Never Used  . Tobacco comment: quit on 03/15/16  Substance and Sexual Activity  . Alcohol use: No  . Drug use: Yes    Types: Marijuana    Comment: last time 02/2016  . Sexual activity: Yes  Other Topics Concern  . Not on file  Social History Narrative   Lives in [place] by himself.  Sometimes stays with girlfriend in [place].  Does not routinely exercise.   Social Determinants of Health   Financial Resource Strain:   . Difficulty of Paying Living Expenses: Not on file  Food Insecurity:   . Worried About Programme researcher, broadcasting/film/video in the Last Year: Not on file  . Ran Out of Food in the Last Year: Not on file  Transportation Needs:   . Lack of Transportation (Medical): Not on file  . Lack of Transportation (Non-Medical): Not on file  Physical Activity:   . Days of Exercise per Week: Not on file  . Minutes of Exercise per Session: Not on file  Stress:   . Feeling of Stress : Not on file  Social Connections:   . Frequency of Communication with Friends and Family: Not on file  . Frequency of Social Gatherings with Friends and Family: Not on file  . Attends Religious Services: Not on file  . Active Member of Clubs or Organizations: Not on file  . Attends Banker Meetings: Not on file  . Marital  Status: Not on file  Intimate Partner Violence:   . Fear of Current or Ex-Partner: Not on file  . Emotionally Abused: Not on file  . Physically Abused: Not on file  . Sexually Abused: Not on file   Family History  Problem Relation Age of Onset  . Arrhythmia Mother        MOTHER LIVED TO BE 89  . Coronary artery disease Mother   . Heart attack Mother   . Hypertension Mother   . Coronary artery disease Father 19  . Heart disease Father   . Heart attack Father   . Heart attack  Brother   . Stroke Neg Hx    Past Surgical History:  Procedure Laterality Date  . CARDIAC CATHETERIZATION N/A 04/23/2015   Procedure: Left Heart Cath and Coronary Angiography;  Surgeon: Wendall Stade, MD;  Location: [facility] INVASIVE CV LAB;  Service: Cardiovascular;  Laterality: N/A;  . CARDIAC CATHETERIZATION N/A 04/23/2015   Procedure: Coronary Stent Intervention;  Surgeon: Tonny Bollman, MD;  Location: [facility] INVASIVE CV LAB;  Service: Cardiovascular;  Laterality: N/A;  . CARDIAC CATHETERIZATION N/A 09/14/2015   Procedure: Left Heart Cath and Coronary Angiography;  Surgeon: Lyn Records, MD;  LAD 100%, CFX 10% ISR, RCA 100% (chronic), EF 25-35%  . CARDIAC CATHETERIZATION N/A 09/14/2015   Procedure: Coronary Stent Intervention;  Surgeon: Lyn Records, MD;  Location: [facility] INVASIVE CV LAB;  Service: Cardiovascular;  Laterality: N/A; 3.0 x 16 mm Synergy DES LAD  . CARDIAC CATHETERIZATION  03/16/2016  . CARDIAC CATHETERIZATION N/A 03/16/2016   Procedure: Left Heart Cath and Coronary Angiography;  Surgeon: Lyn Records, MD;  Location: [facility] INVASIVE CV LAB;  Service: Cardiovascular;  Laterality: N/A;  . CARDIAC CATHETERIZATION N/A 03/16/2016   Procedure: Coronary Balloon Angioplasty;  Surgeon: Lyn Records, MD;  Location: [facility] INVASIVE CV LAB;  Service: Cardiovascular;  Laterality: N/A;  . CARDIAC CATHETERIZATION N/A 03/25/2016   Procedure: Left Heart Cath and Coronary Angiography;  Surgeon: Peter M Swaziland, MD;  Location: [facility] INVASIVE  CV LAB;  Service: Cardiovascular;  Laterality: N/A;  . CORONARY ANGIOPLASTY WITH STENT PLACEMENT  2010; 2013  . CORONARY BALLOON ANGIOPLASTY N/A 10/19/2016   Procedure: Coronary Balloon Angioplasty;  Surgeon: Corky Crafts, MD;  Location: [facility] INVASIVE CV LAB;  Service: Cardiovascular;  Laterality: N/A;  . INTRAVASCULAR ULTRASOUND/IVUS N/A 10/19/2016   Procedure: Intravascular Ultrasound/IVUS;  Surgeon: Corky Crafts, MD;  Location: [facility] INVASIVE CV LAB;  Service: Cardiovascular;  Laterality: N/A;  . LEFT HEART CATH AND CORONARY ANGIOGRAPHY N/A 10/19/2016   Procedure: Left Heart Cath and Coronary Angiography;  Surgeon: Corky Crafts, MD;  Location: [facility] INVASIVE CV LAB;  Service: Cardiovascular;  Laterality: N/A;     Mardella Layman, MD 09/05/19 1919    Mardella Layman, MD 09/05/19 Jerene Bears

## 2019-09-15 ENCOUNTER — Other Ambulatory Visit: Payer: Self-pay | Admitting: Cardiovascular Disease

## 2019-09-17 ENCOUNTER — Other Ambulatory Visit: Payer: Self-pay

## 2019-09-17 ENCOUNTER — Emergency Department (HOSPITAL_COMMUNITY)
Admission: EM | Admit: 2019-09-17 | Discharge: 2019-09-17 | Disposition: A | Discharge status: Home / Self Care | Payer: Medicare Other | Attending: Emergency Medicine | Admitting: Emergency Medicine

## 2019-09-17 ENCOUNTER — Encounter (HOSPITAL_COMMUNITY): Payer: Self-pay | Admitting: Emergency Medicine

## 2019-09-17 DIAGNOSIS — R42 Dizziness and giddiness: Secondary | ICD-10-CM | POA: Diagnosis not present

## 2019-09-17 DIAGNOSIS — I252 Old myocardial infarction: Secondary | ICD-10-CM | POA: Insufficient documentation

## 2019-09-17 DIAGNOSIS — I5042 Chronic combined systolic (congestive) and diastolic (congestive) heart failure: Secondary | ICD-10-CM | POA: Diagnosis not present

## 2019-09-17 DIAGNOSIS — Z79899 Other long term (current) drug therapy: Secondary | ICD-10-CM | POA: Insufficient documentation

## 2019-09-17 DIAGNOSIS — Z87891 Personal history of nicotine dependence: Secondary | ICD-10-CM | POA: Diagnosis not present

## 2019-09-17 DIAGNOSIS — I2510 Atherosclerotic heart disease of native coronary artery without angina pectoris: Secondary | ICD-10-CM | POA: Insufficient documentation

## 2019-09-17 DIAGNOSIS — Z7982 Long term (current) use of aspirin: Secondary | ICD-10-CM | POA: Diagnosis not present

## 2019-09-17 DIAGNOSIS — I110 Hypertensive heart disease with heart failure: Secondary | ICD-10-CM | POA: Insufficient documentation

## 2019-09-17 LAB — URINALYSIS, ROUTINE W REFLEX MICROSCOPIC
Bilirubin Urine: NEGATIVE
Glucose, UA: NEGATIVE mg/dL
Ketones, ur: NEGATIVE mg/dL
Leukocytes,Ua: NEGATIVE
Nitrite: NEGATIVE
Protein, ur: 100 mg/dL — AB
Specific Gravity, Urine: 1.013 (ref 1.005–1.030)
pH: 6 (ref 5.0–8.0)

## 2019-09-17 LAB — CBC
HCT: 40.8 % (ref 39.0–52.0)
Hemoglobin: 13.8 g/dL (ref 13.0–17.0)
MCH: 29 pg (ref 26.0–34.0)
MCHC: 33.8 g/dL (ref 30.0–36.0)
MCV: 85.7 fL (ref 80.0–100.0)
Platelets: 242 10*3/uL (ref 150–400)
RBC: 4.76 MIL/uL (ref 4.22–5.81)
RDW: 13.7 % (ref 11.5–15.5)
WBC: 7 10*3/uL (ref 4.0–10.5)
nRBC: 0 % (ref 0.0–0.2)

## 2019-09-17 LAB — BASIC METABOLIC PANEL
Anion gap: 12 (ref 5–15)
BUN: 9 mg/dL (ref 6–20)
CO2: 26 mmol/L (ref 22–32)
Calcium: 8.8 mg/dL — ABNORMAL LOW (ref 8.9–10.3)
Chloride: 101 mmol/L (ref 98–111)
Creatinine, Ser: 1.16 mg/dL (ref 0.61–1.24)
GFR calc Af Amer: >60 mL/min (ref >60)
GFR calc non Af Amer: >60 mL/min (ref >60)
Glucose, Bld: 144 mg/dL — ABNORMAL HIGH (ref 70–99)
Potassium: 3.6 mmol/L (ref 3.5–5.1)
Sodium: 139 mmol/L (ref 135–145)

## 2019-09-17 LAB — CBG MONITORING, ED: Glucose-Capillary: 139 mg/dL — ABNORMAL HIGH (ref 70–99)

## 2019-09-17 LAB — TROPONIN I (HIGH SENSITIVITY): Troponin I (High Sensitivity): 4 ng/L (ref <18)

## 2019-09-17 NOTE — ED Triage Notes (Signed)
Pt states he came from the dentist office and felt dizzy when leaving. States he hasn't eaten anything and also endorses anxiety.

## 2019-09-17 NOTE — ED Provider Notes (Signed)
[facility] EMERGENCY DEPARTMENT Provider Note   CSN: [id number] Arrival date & time: 09/17/19  1242     History Chief Complaint  Patient presents with  . Dizziness    Peter Russo is a 44 y.o. male with PMH significant for anxiety, HLD, HTN, tobacco use, obesity, multiple myocardial infarctions who presents to the ED with complaints of lightheadedness.  Patient reports that he was at the dentist and had received numerous shots of Novocain.  Shortly after getting up and going to his car, he became acutely lightheaded.  He believes it began shortly after he stood up from his chair.  He states that he did not eat or drink anything prior to his dentist appointment and is very hungry and thirsty.  Despite feeling lightheaded, he drove to the ED without difficulty.  He endorses feeling mildly short of breath, but attributes it to his significant anxiety related to his poor health.  He broke down in tears during my examination and states that he frequently suffers from anxiety attacks, more recently since his mother passed away last year.  He denies any room spinning dizziness, headache, chest pain or difficulty breathing, abdominal pain, nausea or vomiting, diaphoresis, weakness, numbness, ataxic gait, or other symptoms.   HPI     Past Medical History:  Diagnosis Date  . Anxiety   . CAD S/P percutaneous coronary angioplasty    a. s/p BMS-mLAD 2010. b. DES to RCA 2012. c. 04/2015: DES to LCx and PCI to LAD c/b dissection with subsequent overlapping DES to mLAD - r/i for NSTEMI afterwards; d. 08/2015 Ant STEMI in setting of noncompliance->193mLAD ISR (3.0x16 Synergy DES); e. 03/2016 PCI of apical LAD; f. 03/2016 Relook Cath: patent LAD stents, RCA 100p CTO-->Med Rx; g. 10/2016 Ant STEMI: PTCA 100p LAD.  Marland Kitchen Chronic combined systolic and diastolic CHF (congestive heart failure) (HCC)    a. 03/2016 Echo: EF 30-35%, Gr2 DD;  b. 08/2016 Echo: EF 40%.  . Depression   . Essential  hypertension   . GAD (generalized anxiety disorder)   . GERD (gastroesophageal reflux disease)   . Hypercholesteremia   . Ischemic cardiomyopathy    a. prior EF 25-35 percent at cath, 35-40 percent by echo; b. 03/2016 Echo: EF 30-35%, diff HK, Gr2 DD; c. 08/2016 Echo: EF 40%, base/mid inferior/inferowseptal AK, mildly dil LA.  . Morbid obesity (HCC)   . Pre-diabetes   . SVT (supraventricular tachycardia) (HCC) APRROX 10 YRS AGO   PRESUMED AVNRT, ECHO 12/07 WITH EF 65%, MILD LAE  . Tobacco abuse    a. 20 + pack years, quit 10/2016.    Patient Active Problem List   Diagnosis Date Noted  . Stable angina (HCC)   . Elevated troponin   . Mixed hyperlipidemia   . Chest pain 11/17/2016  . CAD (coronary artery disease) 11/12/2016  . AKI (acute kidney injury) (HCC) 11/11/2016  . History of acute anterior wall MI 10/19/2016  . Chronic combined systolic and diastolic CHF (congestive heart failure) (HCC)   . Ischemic cardiomyopathy   . H/O medication noncompliance   . Ischemic chest pain (HCC)   . Morbid obesity (HCC)   . Pre-diabetes   . Tobacco abuse   . CAD S/P percutaneous coronary angioplasty   . Essential hypertension   . Cellulitis of hand 05/27/2015  . 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 05/26/2015  . NSTEMI (non-ST elevated myocardial infarction) (HCC) 04/24/2015  . Depression 12/26/2014  . GAD (generalized anxiety disorder)  12/26/2014  . GERD (gastroesophageal reflux disease) 03/01/2013  . Dyslipidemia 03/14/2011    Past Surgical History:  Procedure Laterality Date  . CARDIAC CATHETERIZATION N/A 04/23/2015   Procedure: Left Heart Cath and Coronary Angiography;  Surgeon: Josue Hector, MD;  Location: [place] [facility];  Service: Cardiovascular;  Laterality: N/A;  . CARDIAC CATHETERIZATION N/A 04/23/2015   Procedure: Coronary Stent Intervention;  Surgeon: Sherren Mocha, MD;  Location: [place] [facility];  Service: Cardiovascular;  Laterality: N/A;  . CARDIAC  CATHETERIZATION N/A 09/14/2015   Procedure: Left Heart Cath and Coronary Angiography;  Surgeon: Belva Crome, MD;  LAD 100%, CFX 10% ISR, RCA 100% (chronic), EF 25-35%  . CARDIAC CATHETERIZATION N/A 09/14/2015   Procedure: Coronary Stent Intervention;  Surgeon: Belva Crome, MD;  Location: [place] [facility];  Service: Cardiovascular;  Laterality: N/A; 3.0 x 16 mm Synergy DES LAD  . CARDIAC CATHETERIZATION  03/16/2016  . CARDIAC CATHETERIZATION N/A 03/16/2016   Procedure: Left Heart Cath and Coronary Angiography;  Surgeon: Belva Crome, MD;  Location: [place] [facility];  Service: Cardiovascular;  Laterality: N/A;  . CARDIAC CATHETERIZATION N/A 03/16/2016   Procedure: Coronary Balloon Angioplasty;  Surgeon: Belva Crome, MD;  Location: [place] [facility];  Service: Cardiovascular;  Laterality: N/A;  . CARDIAC CATHETERIZATION N/A 03/25/2016   Procedure: Left Heart Cath and Coronary Angiography;  Surgeon: Peter M Martinique, MD;  Location: [place] [facility];  Service: Cardiovascular;  Laterality: N/A;  . CORONARY ANGIOPLASTY WITH STENT PLACEMENT  2010; 2013  . CORONARY BALLOON ANGIOPLASTY N/A 10/19/2016   Procedure: Coronary Balloon Angioplasty;  Surgeon: Jettie Booze, MD;  Location: [place] [facility];  Service: Cardiovascular;  Laterality: N/A;  . INTRAVASCULAR ULTRASOUND/IVUS N/A 10/19/2016   Procedure: Intravascular Ultrasound/IVUS;  Surgeon: Jettie Booze, MD;  Location: [place] [facility];  Service: Cardiovascular;  Laterality: N/A;  . LEFT HEART CATH AND CORONARY ANGIOGRAPHY N/A 10/19/2016   Procedure: Left Heart Cath and Coronary Angiography;  Surgeon: Jettie Booze, MD;  Location: [place] [facility];  Service: Cardiovascular;  Laterality: N/A;       Family History  Problem Relation Age of Onset  . Arrhythmia Mother        MOTHER LIVED TO BE 102  . Coronary artery disease Mother   . Heart attack Mother   . Hypertension Mother   . Coronary artery disease Father 72  . Heart  disease Father   . Heart attack Father   . Heart attack Brother   . Stroke Neg Hx     Social History   Tobacco Use  . Smoking status: Former Smoker    Packs/day: 1.00    Years: 24.00    Pack years: 24.00    Quit date: 11/03/2016    Years since quitting: 2.8  . Smokeless tobacco: Never Used  . Tobacco comment: quit on 03/15/16  Substance Use Topics  . Alcohol use: No  . Drug use: Yes    Types: Marijuana    Comment: last time 02/2016    Home Medications Prior to Admission medications   Medication Sig Start Date End Date Taking? Authorizing Provider  acetaminophen (TYLENOL) 325 MG tablet Take 2 tablets (650 mg total) by mouth every 4 (four) hours as needed for headache or mild pain. 11/13/16   Erlene Quan, PA-C  aspirin 81 MG EC tablet Take 1 tablet (81 mg total) by mouth daily. 10/21/16   Ledora Bottcher, PA  atorvastatin (LIPITOR) 80 MG tablet TAKE 1 TABLET BY MOUTH EVERY DAY AT 6 PM 03/26/19   Wendall Stade, MD  buPROPion (WELLBUTRIN XL) 150 MG 24 hr tablet Take 150 mg by mouth 2 (two) times daily.    [provider]  carvedilol (COREG) 12.5 MG tablet Take 1 tablet (12.5 mg total) by mouth 2 (two) times daily. 07/02/18 09/05/19  Rosalio Macadamia, NP  doxycycline (VIBRAMYCIN) 100 MG capsule Take 1 capsule (100 mg total) by mouth 2 (two) times daily. 09/05/19   Mardella Layman, MD  furosemide (LASIX) 40 MG tablet Take 1 tablet (40 mg total) by mouth daily. 09/16/19   Wendall Stade, MD  HYDROcodone-acetaminophen (NORCO/VICODIN) 5-325 MG tablet Take 1 tablet by mouth every 6 (six) hours as needed for moderate pain or severe pain. 09/05/19   Mardella Layman, MD  losartan (COZAAR) 100 MG tablet TAKE 1 TABLET BY MOUTH EVERY DAY 03/25/19   Rosalio Macadamia, NP  nitroGLYCERIN (NITROSTAT) 0.4 MG SL tablet PLACE 1 TABLET UNDER TONGUE EVERY 5 MINS, UP TO 3 DOSES AS NEEDED FOR CHEST PAIN 04/10/19   Wendall Stade, MD  spironolactone (ALDACTONE) 25 MG tablet TAKE 1 TABLET BY MOUTH EVERY DAY  08/19/19   Wendall Stade, MD  ticagrelor (BRILINTA) 90 MG TABS tablet Take 1 tablet (90 mg total) by mouth 2 (two) times daily. 07/02/18   Rosalio Macadamia, NP    Allergies    Patient has no known allergies.  Review of Systems   Review of Systems  All other systems reviewed and are negative.   Physical Exam Updated Vital Signs BP 129/78   Pulse 81   Temp 98 F (36.7 C) (Oral)   Resp (!) 29   SpO2 98%   Physical Exam Vitals and nursing note reviewed. Exam conducted with a chaperone present.  Constitutional:      General: He is not in acute distress.    Appearance: Normal appearance. He is not diaphoretic.     Comments: Tearful.  HENT:     Head: Normocephalic and atraumatic.  Eyes:     General: No scleral icterus.    Extraocular Movements: Extraocular movements intact.     Conjunctiva/sclera: Conjunctivae normal.     Pupils: Pupils are equal, round, and reactive to light.  Cardiovascular:     Rate and Rhythm: Normal rate and regular rhythm.     Pulses: Normal pulses.     Heart sounds: Normal heart sounds.  Pulmonary:     Effort: Pulmonary effort is normal. No respiratory distress.     Breath sounds: Normal breath sounds.  Abdominal:     General: Abdomen is flat. There is no distension.     Palpations: Abdomen is soft.     Tenderness: There is no abdominal tenderness. There is no guarding.  Musculoskeletal:        General: Normal range of motion.     Cervical back: Normal range of motion and neck supple. No rigidity.  Skin:    General: Skin is dry.     Capillary Refill: Capillary refill takes less than 2 seconds.  Neurological:     Mental Status: He is alert.     GCS: GCS eye subscore is 4. GCS verbal subscore is 5. GCS motor subscore is 6.     Comments: CN II to XII grossly intact.  Negative Romberg and cerebellar exams.  Sensation intact throughout.  Normal gait.  Smile symmetrically.  Alert and oriented x4.  PERRL and EOM intact.  Psychiatric:        Mood and  Affect: Mood normal.        Behavior: Behavior normal.        Thought Content: Thought content normal.     Comments: Anxious.     ED Results / Procedures / Treatments   Labs (all labs ordered are listed, but only abnormal results are displayed) Labs Reviewed  BASIC METABOLIC PANEL - Abnormal; Notable for the following components:      Result Value   Glucose, Bld 144 (*)    Calcium 8.8 (*)    All other components within normal limits  URINALYSIS, ROUTINE W REFLEX MICROSCOPIC - Abnormal; Notable for the following components:   Hgb urine dipstick SMALL (*)    Protein, ur 100 (*)    Bacteria, UA RARE (*)    All other components within normal limits  CBG MONITORING, ED - Abnormal; Notable for the following components:   Glucose-Capillary 139 (*)    All other components within normal limits  CBC  CBG MONITORING, ED  TROPONIN I (HIGH SENSITIVITY)    EKG EKG Interpretation  Date/Time:  Tuesday September 17 2019 14:13:49 EST Ventricular Rate:  81 PR Interval:    QRS Duration: 103 QT Interval:  402 QTC Calculation: 467 R Axis:   15 Text Interpretation: Sinus rhythm Biatrial enlargement Abnormal T, consider ischemia, diffuse leads Confirmed by Benjiman Core [phone]) on 09/17/2019 2:44:01 PM   Radiology No results found.  Procedures Procedures (including critical care time)  Medications Ordered in ED Medications - No data to display  ED Course  I have reviewed the triage vital signs and the nursing notes.  Pertinent labs & imaging results that were available during my care of the patient were reviewed by me and considered in my medical decision making (see chart for details).    MDM Rules/Calculators/A&P                      Patient states that this feels nothing like his four prior MIs.  EKG obtained demonstrates T wave inversion possible ischemia, will obtain a troponin.  Do not feel as though we need to trend given that his lightheadedness began approximately 3+  hours ago.  Will obtain basic laboratory work-up.  Suspect that his acute onset lightheadedness is attributable to his anxiety in conjunction with lack of eating/drinking at all today.  Orthostatic vital signs were within normal limits.  UA, BMP, and CBC are unremarkable.  On reevaluation, he is not complaining of any symptoms and feels as though he is at baseline.  He is resting comfortably in bed watching television.  He feels significantly improved since eating and drinking and feels cleared for discharge.  Initial troponin was within normal limits at 4.  Discussed with Dr. Rubin Payor and he feels that we do not need to trend troponin at this time.  Recommending that he follow-up with his primary care provider as well as his cardiologist regarding today's encounter.  Strict return precautions discussed.  All of the evaluation and work-up results were discussed with the patient and any family at bedside. They were provided opportunity to ask any additional questions and have none at this time. They have expressed understanding of verbal discharge instructions as well as return precautions and are agreeable to the plan.    Final Clinical Impression(s) / ED Diagnoses Final diagnoses:  Lightheadedness    Rx / DC Orders ED Discharge Orders  None       Elvera MariaGreen, Madoc Holquin L, PA-C 09/17/19 1657    Benjiman CorePickering, Nathan, MD 09/18/19 [phone]

## 2019-09-17 NOTE — Discharge Instructions (Signed)
Please follow-up with your primary care provider as well as cardiology regarding today's encounter.  Your lightheadedness is likely attributable to not eating or drinking today in conjunction with your anxiety.  You may want to consider further discussing your anxiety with your primary care provider.  You may benefit from therapy.    Please return to the ED or seek immediate medical attention for any new or worsening symptoms.

## 2019-09-17 NOTE — ED Notes (Signed)
Patient verbalizes understanding of discharge instructions. Opportunity for questioning and answers were provided. Armband removed by staff, pt discharged from ED.  

## 2019-09-20 ENCOUNTER — Ambulatory Visit: Payer: Medicare Other | Attending: Internal Medicine

## 2019-09-20 DIAGNOSIS — Z20822 Contact with and (suspected) exposure to covid-19: Secondary | ICD-10-CM | POA: Diagnosis not present

## 2019-09-22 ENCOUNTER — Telehealth: Payer: Self-pay | Admitting: Family Medicine

## 2019-09-22 LAB — NOVEL CORONAVIRUS, NAA: SARS-CoV-2, NAA: DETECTED — AB

## 2019-09-22 NOTE — Telephone Encounter (Signed)
Pt calling back for covid test results. Please call back.  Pt is positive.

## 2019-09-23 ENCOUNTER — Encounter: Payer: Self-pay | Admitting: *Deleted

## 2019-09-23 ENCOUNTER — Telehealth: Payer: Self-pay | Admitting: Nurse Practitioner

## 2019-09-23 ENCOUNTER — Telehealth: Payer: Self-pay | Admitting: *Deleted

## 2019-09-23 ENCOUNTER — Other Ambulatory Visit: Payer: Medicare Other

## 2019-09-23 NOTE — Telephone Encounter (Signed)
Called to Discuss with patient about Covid symptoms and the use of bamlanivimab, a monoclonal antibody infusion for those with mild to moderate Covid symptoms and at a high risk of hospitalization.     Pt is qualified for this infusion at the [facility] center due to co-morbid conditions and/or a member of an at-risk group.     Unable to reach pt  

## 2019-09-23 NOTE — Telephone Encounter (Signed)
Pt notified of positive COVID-19 test results. Pt verbalized understanding. Pt reports that they are feeling "fine".Pt advised to remain in self quarantine until at least 10 days since symptom onset And 3 consecutive days fever free without antipyretics And improvement in respiratory symptoms. Patient advised to utilize over the counter medications to treat symptoms. Pt advised to seek treatment in the ED if respiratory issues/distress develops.Pt advised they should only leave home to seek and medical care and must wear a mask in public. Pt instructed to limit contact with family members or caregivers in the home. Pt advised to practice social distancing and to continue to use good preventative care measures such has frequent hand washing, staying out of crowds and cleaning hard surfaces frequently touched in the home.Pt informed that the health department will likely follow up and may have additional recommendations. Will notify [place] [facility].  

## 2019-09-23 NOTE — Telephone Encounter (Signed)
Spoke with patient regarding appointment for cardiac mri scheduled 10/04/19 at 9:00 am at [facility]---arrival time is 8:15 am 1st floor admissions office---will mail information to patient and it is also in My Chart

## 2019-10-02 ENCOUNTER — Encounter (HOSPITAL_COMMUNITY): Payer: Self-pay

## 2019-10-03 ENCOUNTER — Telehealth (HOSPITAL_COMMUNITY): Payer: Self-pay | Admitting: Emergency Medicine

## 2019-10-03 NOTE — Telephone Encounter (Signed)
Reaching out to patient to offer assistance regarding upcoming cardiac imaging study; pt verbalizes understanding of appt date/time, parking situation and where to check in, and verified current allergies; name and call back number provided for further questions should they arise Rockwell Alexandria RN Navigator Cardiac Imaging [facility] [phone] office [phone] cell  Pt denies metal implants, denies claustrophobia.

## 2019-10-03 NOTE — Telephone Encounter (Signed)
Left message on voicemail with name and callback number Copeland Lapier RN Navigator Cardiac Imaging [facility] [phone] Office [phone] Cell  

## 2019-10-04 ENCOUNTER — Other Ambulatory Visit: Payer: Self-pay

## 2019-10-04 ENCOUNTER — Ambulatory Visit (HOSPITAL_COMMUNITY)
Admission: RE | Admit: 2019-10-04 | Discharge: 2019-10-04 | Disposition: A | Discharge status: Home / Self Care | Payer: Medicare Other | Source: Ambulatory Visit | Attending: Cardiology | Admitting: Cardiology

## 2019-10-04 DIAGNOSIS — I10 Essential (primary) hypertension: Secondary | ICD-10-CM | POA: Diagnosis not present

## 2019-10-04 DIAGNOSIS — I255 Ischemic cardiomyopathy: Secondary | ICD-10-CM | POA: Insufficient documentation

## 2019-10-04 DIAGNOSIS — E785 Hyperlipidemia, unspecified: Secondary | ICD-10-CM | POA: Diagnosis not present

## 2019-10-04 DIAGNOSIS — Z79899 Other long term (current) drug therapy: Secondary | ICD-10-CM | POA: Diagnosis not present

## 2019-10-04 DIAGNOSIS — I2510 Atherosclerotic heart disease of native coronary artery without angina pectoris: Secondary | ICD-10-CM | POA: Diagnosis not present

## 2019-10-04 MED ORDER — GADOBUTROL 1 MMOL/ML IV SOLN
14.0000 mL | Freq: Once | INTRAVENOUS | Status: AC | PRN
  Administered 2019-10-04: 14 mL via INTRAVENOUS

## 2019-10-07 ENCOUNTER — Telehealth: Payer: Self-pay

## 2019-10-07 DIAGNOSIS — R9389 Abnormal findings on diagnostic imaging of other specified body structures: Secondary | ICD-10-CM

## 2019-10-07 DIAGNOSIS — I420 Dilated cardiomyopathy: Secondary | ICD-10-CM

## 2019-10-07 DIAGNOSIS — I255 Ischemic cardiomyopathy: Secondary | ICD-10-CM

## 2019-10-07 NOTE — Telephone Encounter (Signed)
Called patient with MRI results. Put in referrals for patient to see CHF clinic and EP.

## 2019-10-07 NOTE — Telephone Encounter (Signed)
-----   Message from Wendall Stade, MD sent at 10/07/2019  2:35 PM EST ----- Severe ischemic DCM f/u with CHF clinic to optimize meds then EP to discuss AICD

## 2019-10-21 ENCOUNTER — Ambulatory Visit (INDEPENDENT_AMBULATORY_CARE_PROVIDER_SITE_OTHER): Payer: Medicare Other | Admitting: Internal Medicine

## 2019-10-21 ENCOUNTER — Other Ambulatory Visit: Payer: Self-pay

## 2019-10-21 ENCOUNTER — Telehealth: Payer: Self-pay | Admitting: *Deleted

## 2019-10-21 ENCOUNTER — Encounter: Payer: Self-pay | Admitting: Internal Medicine

## 2019-10-21 VITALS — BP 142/68 | HR 84 | Ht 67.0 in | Wt 273.0 lb

## 2019-10-21 DIAGNOSIS — R0683 Snoring: Secondary | ICD-10-CM

## 2019-10-21 DIAGNOSIS — I255 Ischemic cardiomyopathy: Secondary | ICD-10-CM

## 2019-10-21 MED ORDER — SACUBITRIL-VALSARTAN 97-103 MG PO TABS: 1.0000 | ORAL_TABLET | Freq: Two times a day (BID) | ORAL | 3 refills | Status: DC

## 2019-10-21 NOTE — Progress Notes (Signed)
ELECTROPHYSIOLOGY CONSULT NOTE  Patient ID: Peter Russo, MRN: [id number], DOB/AGE: 01/10/1976 44 y.o. Admit date: (Not on file) Date of Consult: 10/21/2019  Primary Physician: Hoy Register, MD Primary Cardiologist: Massey Ruhland is a 44 y.o. male who is being seen today for the evaluation of ICD  at the request of [name].    HPI Peter Russo is a 44 y.o. male referred for consideration of an ICD in the context of ischemic cardiomyopathy.  History of premature coronary artery disease with LAD stenting 2010, RCA stenting 2012 with issues of noncompliance related at least in part to affordability of medications.  He has had no history of syncope or tachypalpitations.  No recent chest pain.  Dyspnea on exertion.  Some peripheral edema.  No nocturnal dyspnea orthopnea.  Sleep disordered breathing and daytime somnolence.  He is intermittently tearful.  He discusses his fear of dying, worried about his 2 children (14 and 83)  DATE TEST EF   2/17 Echo  35-40%   3/18 LHC*   LADp T-PCI; CXp-10%; RCAp-T (CTO)  12/19 Echo   20.25 %   2/21 cMRI 26% Anteroapical fullthickness LGE Inferior HK     Date Cr K LDL Hgb  2/21 1.16 3.6  74 13.8             Past Medical History:  Diagnosis Date  . Anxiety   . CAD S/P percutaneous coronary angioplasty    a. s/p BMS-mLAD 2010. b. DES to RCA 2012. c. 04/2015: DES to LCx and PCI to LAD c/b dissection with subsequent overlapping DES to mLAD - r/i for NSTEMI afterwards; d. 08/2015 Ant STEMI in setting of noncompliance->118mLAD ISR (3.0x16 Synergy DES); e. 03/2016 PCI of apical LAD; f. 03/2016 Relook Cath: patent LAD stents, RCA 100p CTO-->Med Rx; g. 10/2016 Ant STEMI: PTCA 100p LAD.  Marland Kitchen Chronic combined systolic and diastolic CHF (congestive heart failure) (HCC)    a. 03/2016 Echo: EF 30-35%, Gr2 DD;  b. 08/2016 Echo: EF 40%.  . Depression   . Essential hypertension   . GAD (generalized anxiety disorder)   . GERD  (gastroesophageal reflux disease)   . Hypercholesteremia   . Ischemic cardiomyopathy    a. prior EF 25-35 percent at cath, 35-40 percent by echo; b. 03/2016 Echo: EF 30-35%, diff HK, Gr2 DD; c. 08/2016 Echo: EF 40%, base/mid inferior/inferowseptal AK, mildly dil LA.  . Morbid obesity (HCC)   . Pre-diabetes   . SVT (supraventricular tachycardia) (HCC) APRROX 10 YRS AGO   PRESUMED AVNRT, ECHO 12/07 WITH EF 65%, MILD LAE  . Tobacco abuse    a. 20 + pack years, quit 10/2016.      Surgical History:  Past Surgical History:  Procedure Laterality Date  . CARDIAC CATHETERIZATION N/A 04/23/2015   Procedure: Left Heart Cath and Coronary Angiography;  Surgeon: Wendall Stade, MD;  Location: [facility] INVASIVE CV LAB;  Service: Cardiovascular;  Laterality: N/A;  . CARDIAC CATHETERIZATION N/A 04/23/2015   Procedure: Coronary Stent Intervention;  Surgeon: Tonny Bollman, MD;  Location: [facility] INVASIVE CV LAB;  Service: Cardiovascular;  Laterality: N/A;  . CARDIAC CATHETERIZATION N/A 09/14/2015   Procedure: Left Heart Cath and Coronary Angiography;  Surgeon: Lyn Records, MD;  LAD 100%, CFX 10% ISR, RCA 100% (chronic), EF 25-35%  . CARDIAC CATHETERIZATION N/A 09/14/2015   Procedure: Coronary Stent Intervention;  Surgeon: Lyn Records, MD;  Location: [facility] INVASIVE CV LAB;  Service:  Cardiovascular;  Laterality: N/A; 3.0 x 16 mm Synergy DES LAD  . CARDIAC CATHETERIZATION  03/16/2016  . CARDIAC CATHETERIZATION N/A 03/16/2016   Procedure: Left Heart Cath and Coronary Angiography;  Surgeon: Belva Crome, MD;  Location: [place] CV LAB;  Service: Cardiovascular;  Laterality: N/A;  . CARDIAC CATHETERIZATION N/A 03/16/2016   Procedure: Coronary Balloon Angioplasty;  Surgeon: Belva Crome, MD;  Location: [place] CV LAB;  Service: Cardiovascular;  Laterality: N/A;  . CARDIAC CATHETERIZATION N/A 03/25/2016   Procedure: Left Heart Cath and Coronary Angiography;  Surgeon: Peter M Martinique, MD;  Location: [place] CV LAB;  Service:  Cardiovascular;  Laterality: N/A;  . CORONARY ANGIOPLASTY WITH STENT PLACEMENT  2010; 2013  . CORONARY BALLOON ANGIOPLASTY N/A 10/19/2016   Procedure: Coronary Balloon Angioplasty;  Surgeon: Jettie Booze, MD;  Location: [place] CV LAB;  Service: Cardiovascular;  Laterality: N/A;  . INTRAVASCULAR ULTRASOUND/IVUS N/A 10/19/2016   Procedure: Intravascular Ultrasound/IVUS;  Surgeon: Jettie Booze, MD;  Location: [place] CV LAB;  Service: Cardiovascular;  Laterality: N/A;  . LEFT HEART CATH AND CORONARY ANGIOGRAPHY N/A 10/19/2016   Procedure: Left Heart Cath and Coronary Angiography;  Surgeon: Jettie Booze, MD;  Location: [place] CV LAB;  Service: Cardiovascular;  Laterality: N/A;     Home Meds: Current Meds  Medication Sig  . acetaminophen (TYLENOL) 325 MG tablet Take 2 tablets (650 mg total) by mouth every 4 (four) hours as needed for headache or mild pain.  Marland Kitchen aspirin 81 MG EC tablet Take 1 tablet (81 mg total) by mouth daily.  Marland Kitchen atorvastatin (LIPITOR) 80 MG tablet TAKE 1 TABLET BY MOUTH EVERY DAY AT 6 PM  . buPROPion (WELLBUTRIN XL) 150 MG 24 hr tablet Take 150 mg by mouth 2 (two) times daily.  . furosemide (LASIX) 40 MG tablet Take 1 tablet (40 mg total) by mouth daily.  Marland Kitchen HYDROcodone-acetaminophen (NORCO/VICODIN) 5-325 MG tablet Take 1 tablet by mouth every 6 (six) hours as needed for moderate pain or severe pain.  Marland Kitchen losartan (COZAAR) 100 MG tablet TAKE 1 TABLET BY MOUTH EVERY DAY  . nitroGLYCERIN (NITROSTAT) 0.4 MG SL tablet PLACE 1 TABLET UNDER TONGUE EVERY 5 MINS, UP TO 3 DOSES AS NEEDED FOR CHEST PAIN  . spironolactone (ALDACTONE) 25 MG tablet TAKE 1 TABLET BY MOUTH EVERY DAY  . ticagrelor (BRILINTA) 90 MG TABS tablet Take 1 tablet (90 mg total) by mouth 2 (two) times daily.    Allergies: No Known Allergies  Social History   Socioeconomic History  . Marital status: Significant Other    Spouse name: Not on file  . Number of children: Not on file  . Years  of education: Not on file  . Highest education level: Not on file  Occupational History  . Occupation: Aeronautical engineer: [employer]  Tobacco Use  . Smoking status: Former Smoker    Packs/day: 1.00    Years: 24.00    Pack years: 24.00    Quit date: 11/03/2016    Years since quitting: 2.9  . Smokeless tobacco: Never Used  . Tobacco comment: quit on 03/15/16  Substance and Sexual Activity  . Alcohol use: No  . Drug use: Yes    Types: Marijuana    Comment: last time 02/2016  . Sexual activity: Yes  Other Topics Concern  . Not on file  Social History Narrative   Lives in [place] by himself.  Sometimes stays with girlfriend in [place].  Does not routinely exercise.   Social Determinants of Health   Financial Resource Strain:   . Difficulty of Paying Living Expenses: Not on file  Food Insecurity:   . Worried About Programme researcher, broadcasting/film/videounning Out of Food in the Last Year: Not on file  . Ran Out of Food in the Last Year: Not on file  Transportation Needs:   . Lack of Transportation (Medical): Not on file  . Lack of Transportation (Non-Medical): Not on file  Physical Activity:   . Days of Exercise per Week: Not on file  . Minutes of Exercise per Session: Not on file  Stress:   . Feeling of Stress : Not on file  Social Connections:   . Frequency of Communication with Friends and Family: Not on file  . Frequency of Social Gatherings with Friends and Family: Not on file  . Attends Religious Services: Not on file  . Active Member of Clubs or Organizations: Not on file  . Attends BankerClub or Organization Meetings: Not on file  . Marital Status: Not on file  Intimate Partner Violence:   . Fear of Current or Ex-Partner: Not on file  . Emotionally Abused: Not on file  . Physically Abused: Not on file  . Sexually Abused: Not on file     Family History  Problem Relation Age of Onset  . Arrhythmia Mother        MOTHER LIVED TO BE 102  . Coronary artery disease Mother   . Heart attack Mother   .  Hypertension Mother   . Coronary artery disease Father [id number]  . Heart disease Father   . Heart attack Father   . Heart attack Brother   . Stroke Neg Hx      ROS:  Please see the history of present illness.     All other systems reviewed and negative.    Physical Exam:  Blood pressure (!) 142/68, pulse 84, height 5\' 7"  (1.702 m), weight 273 lb (123.8 kg), SpO2 99 %. General: Well developed, Morbidly obese  male in no acute distress. Head: Normocephalic, atraumatic, sclera non-icteric, no xanthomas, nares are without discharge. EENT: normal  Lymph Nodes:  none Neck: Negative for carotid bruits. JVD 8-10  + HJR Back:without scoliosis kyphosis  Lungs: Clear bilaterally to auscultation without wheezes, rales, or rhonchi. Breathing is unlabored. Heart: RRR with S1 S2  + S4 murmur . No rubs, or gallops appreciated. Abdomen: Soft, non-tender, non-distended with normoactive bowel sounds. No hepatomegaly. No rebound/guarding. No obvious abdominal masses. Msk:  Strength and tone appear normal for age. Extremities: No clubbing or cyanosis. No edema.  Distal pedal pulses are 2+ and equal bilaterally. Skin: Warm and Dry Neuro: Alert and oriented X 3. CN III-XII intact Grossly normal sensory and motor function . Psych:  Responds to questions appropriately with a normal affect.      Labs: Cardiac Enzymes No results for input(s): CKTOTAL, CKMB, TROPONINI in the last 72 hours. CBC Lab Results  Component Value Date   WBC 7.0 09/17/2019   HGB 13.8 09/17/2019   HCT 40.8 09/17/2019   MCV 85.7 09/17/2019   PLT 242 09/17/2019   PROTIME: No results for input(s): LABPROT, INR in the last 72 hours. Chemistry No results for input(s): NA, K, CL, CO2, BUN, CREATININE, CALCIUM, PROT, BILITOT, ALKPHOS, ALT, AST, GLUCOSE in the last 168 hours.  Invalid input(s): LABALBU Lipids Lab Results  Component Value Date   CHOL 154 08/30/2019   HDL 27 (L) 08/30/2019   LDLCALC 74  08/30/2019   TRIG 330 (H)  08/30/2019   BNP NT-Pro BNP  Date/Time Value Ref Range Status  09/26/2018 11:32 AM 246 (H) 0 - 86 pg/mL Final    Comment:    The following cut-points have been suggested for the use of proBNP for the diagnostic evaluation of heart failure (HF) in patients with acute dyspnea: Modality                     Age           Optimal Cut                            (years)            Point ------------------------------------------------------ Diagnosis (rule in HF)        <50            450 pg/mL                           50 - 75            900 pg/mL                               >75           1800 pg/mL Exclusion (rule out HF)  Age independent     300 pg/mL   09/05/2018 10:47 AM 305 (H) 0 - 86 pg/mL Final    Comment:    The following cut-points have been suggested for the use of proBNP for the diagnostic evaluation of heart failure (HF) in patients with acute dyspnea: Modality                     Age           Optimal Cut                            (years)            Point ------------------------------------------------------ Diagnosis (rule in HF)        <50            450 pg/mL                           50 - 75            900 pg/mL                               >75           1800 pg/mL Exclusion (rule out HF)  Age independent     300 pg/mL   07/02/2018 10:11 AM 285 (H) 0 - 86 pg/mL Final    Comment:    The following cut-points have been suggested for the use of proBNP for the diagnostic evaluation of heart failure (HF) in patients with acute dyspnea: Modality                     Age           Optimal Cut                            (  years)            Point ------------------------------------------------------ Diagnosis (rule in HF)        <50            450 pg/mL                           50 - 75            900 pg/mL                               >75           1800 pg/mL Exclusion (rule out HF)  Age independent     300 pg/mL    Thyroid Function Tests: No results for input(s):  TSH, T4TOTAL, T3FREE, THYROIDAB in the last 72 hours.  Invalid input(s): FREET3 Miscellaneous No results found for: DDIMER  Radiology/Studies:  MR Card Morphology Wo/W Cm  Result Date: 10/07/2019 CLINICAL DATA:  Cardiomyopathy EXAM: CARDIAC MRI TECHNIQUE: The patient was scanned on a 1.5 Tesla GE magnet. A dedicated cardiac coil was used. Functional imaging was done using Fiesta sequences. 2,3, and 4 chamber views were done to assess for RWMA's. Modified Simpson's rule using a short axis stack was used to calculate an ejection fraction on a dedicated work Research officer, trade [employer]. The patient received 14 cc of Gadavist. After 10 minutes inversion recovery sequences were used to assess for infiltration and scar tissue. CONTRAST:  14 cc Gadavist FINDINGS: Limited images of the lung fields showed no gross abnormalities. Severely dilated left ventricle. Normal LV wall thickness. Akinesis of the mid to apical anterior, anteroseptal, and inferoseptal walls. Akinesis of the apical inferior wall. Severe hypokinesis of the inferior wall, the basal anteroseptal/inferoseptal walls, and the apical lateral wall. Calculated EF 26%. No LV thrombus visualized. The right ventricle was mildly dilated with mildly decreased systolic function, EF 44%. Moderate left atrial enlargement. Normal right atrium. Trileaflet aortic valve, no significant regurgitation or stenosis noted. Mitral regurgitation looks mild. Delayed enhancement: Mid anterior, mid anteroseptal, mid inferoseptal walls with about 50% wall thickness subendocardial late gadolinium enhancement (LGE). Apical septal and apical anterior walls and the true apex with 76-99% wall thickness subendocardial LGE. Measurements: LVEDV 336 LVSV 87 LVEF 26% RVEDV 213 mL RVSV 93 mL RVEF 44% IMPRESSION: 1. Severely dilated LV with wall motion abnormalities noted above, EF 26%. 2.  Mildly dilated RV with EF 44%. 3.  LGE as noted above in LAD infarction pattern. Consider CHF  clinic evaluation. Dalton Mclean Electronically Signed   By: Marca Ancona M.D.   On: 10/07/2019 08:44   EKG: sinus @  84 18/10/38 RAD RAE    Assessment and Plan:  Ischemic cardiomyopathy  Congestive heart failure chronic systolic class IIb-IIIa  Obesity  Sleep disordered breathing  Abnormal ECG  Anxiety   The patient has persistent left ventricular dysfunction despite guideline directed therapy.  There are some drug options of which we should avail ourselves.  We will begin today by changing him from losartan--Entresto.  When he goes to the heart failure clinic in a couple of weeks they can begin him on the SGLT2 inhibitors.  His carvedilol dose can be uptitrated when next seen.  He is disinclined towards an ICD although he is quite anxious about survival.  He would like to see if his heart muscle improves prior to making a decision regarding  ICD implantation.  This would be quite appropriate.  Indeed there is an ongoing randomized trial as part of the major protocol looking at the impact of SGLT2 inhibitors on obviating the need for ICD implantation.  I wonder also whether he would benefit from assessment of his coronary anatomy given the interval deterioration of LV function and certainly prior to implantation of an ICD.  This could either be done by CTA or by catheterization.  Will defer to Dr. Genene Churn or the heart failure clinic.  Sleep disordered  breathing.  Needs an outpatient sleep study.  His right heart by cMRI was surprisingly good.  Anxiety is overwhelming.  Will reach out to his PCP regarding pharmacotherapy.  Also encouraged him to seek counseling support. Sleep   Sherryl Manges

## 2019-10-21 NOTE — Progress Notes (Deleted)
Virtual Visit via Telephone Note   This visit type was conducted due to national recommendations for restrictions regarding the COVID-19 Pandemic (e.g. social distancing) in an effort to limit this patient's exposure and mitigate transmission in our community.  Due to his co-morbid illnesses, this patient is at least at moderate risk for complications without adequate follow up.  This format is felt to be most appropriate for this patient at this time.  The patient did not have access to video technology/had technical difficulties with video requiring transitioning to audio format only (telephone).  All issues noted in this document were discussed and addressed.  No physical exam could be performed with this format.  Please refer to the patient's chart for his  consent to telehealth for [facility].   Date:  10/21/2019   ID:  Peter Russo, DOB 02-12-1976, MRN [id number]  Patient Location: Home Provider Location: Office  PCP:  Hoy Register, MD  Cardiologist:  Charlton Haws, MD  Electrophysiologist:  Graciela Husbands    Evaluation Performed:  Follow-Up Visit  Chief Complaint:  Cardiomyopathy and CAD.  History of Present Illness:    Peter Russo is a 44 y.o. male with cardiomyopathy   He has a hx of CAD and Ischemic DCM.  CRF;s include HTN, HLD and DM-2. Has had multiple interventions to LAD most recently apical LAD in 2017. Last cath August 2017 with  SEMI troponin 3.38 patent stents no culprits and known CTO of RCA. Sedentary and still smoking Has had dyspnea over the winter   TTE 07/30/18 EF 20-25% grade 2 diastolic  Mild MR   QRS is narrow and he would not benefit fro BiV pacing   He still is functional class 3 Not working Has medicare/medicaid and not clear that he can afford EntrestoHas no scale at home or BP cuff.   09/20/19 Tested positive for COVID Unable to be reached regarding outpatient Rx With bamlanivimab monoclonal antibody  No severe symptoms and contacted by RN  regarding self quarantine   Cardiac MRI done 10/07/19 reviewed anterior wall scar EF 26% Seen by Dr Graciela Husbands 10/21/19 and AICD deferred. Told to start Entresto and SGLT2 Inhibitor Has f/u with CHF clinic and may required right and left heart cath Prior to entertaining idea of AICD again. Noted severe anxiety suggested primary start him on anxiolytic Rx.  Anxiety worse since mother passed last year He was also suggested to arrange an outpatient sleep study   ***   Past Medical History:  Diagnosis Date  . Anxiety   . CAD S/P percutaneous coronary angioplasty    a. s/p BMS-mLAD 2010. b. DES to RCA 2012. c. 04/2015: DES to LCx and PCI to LAD c/b dissection with subsequent overlapping DES to mLAD - r/i for NSTEMI afterwards; d. 08/2015 Ant STEMI in setting of noncompliance->142mLAD ISR (3.0x16 Synergy DES); e. 03/2016 PCI of apical LAD; f. 03/2016 Relook Cath: patent LAD stents, RCA 100p CTO-->Med Rx; g. 10/2016 Ant STEMI: PTCA 100p LAD.  Marland Kitchen Chronic combined systolic and diastolic CHF (congestive heart failure) (HCC)    a. 03/2016 Echo: EF 30-35%, Gr2 DD;  b. 08/2016 Echo: EF 40%.  . Depression   . Essential hypertension   . GAD (generalized anxiety disorder)   . GERD (gastroesophageal reflux disease)   . Hypercholesteremia   . Ischemic cardiomyopathy    a. prior EF 25-35 percent at cath, 35-40 percent by echo; b. 03/2016 Echo: EF 30-35%, diff HK, Gr2 DD; c. 08/2016 Echo: EF 40%, base/mid  inferior/inferowseptal AK, mildly dil LA.  . Morbid obesity (HCC)   . Pre-diabetes   . SVT (supraventricular tachycardia) (HCC) APRROX 10 YRS AGO   PRESUMED AVNRT, ECHO 12/07 WITH EF 65%, MILD LAE  . Tobacco abuse    a. 20 + pack years, quit 10/2016.   Past Surgical History:  Procedure Laterality Date  . CARDIAC CATHETERIZATION N/A 04/23/2015   Procedure: Left Heart Cath and Coronary Angiography;  Surgeon: Wendall Stade, MD;  Location: [facility] INVASIVE CV LAB;  Service: Cardiovascular;  Laterality: N/A;  . CARDIAC  CATHETERIZATION N/A 04/23/2015   Procedure: Coronary Stent Intervention;  Surgeon: Tonny Bollman, MD;  Location: [facility] INVASIVE CV LAB;  Service: Cardiovascular;  Laterality: N/A;  . CARDIAC CATHETERIZATION N/A 09/14/2015   Procedure: Left Heart Cath and Coronary Angiography;  Surgeon: Lyn Records, MD;  LAD 100%, CFX 10% ISR, RCA 100% (chronic), EF 25-35%  . CARDIAC CATHETERIZATION N/A 09/14/2015   Procedure: Coronary Stent Intervention;  Surgeon: Lyn Records, MD;  Location: [facility] INVASIVE CV LAB;  Service: Cardiovascular;  Laterality: N/A; 3.0 x 16 mm Synergy DES LAD  . CARDIAC CATHETERIZATION  03/16/2016  . CARDIAC CATHETERIZATION N/A 03/16/2016   Procedure: Left Heart Cath and Coronary Angiography;  Surgeon: Lyn Records, MD;  Location: [facility] INVASIVE CV LAB;  Service: Cardiovascular;  Laterality: N/A;  . CARDIAC CATHETERIZATION N/A 03/16/2016   Procedure: Coronary Balloon Angioplasty;  Surgeon: Lyn Records, MD;  Location: [facility] INVASIVE CV LAB;  Service: Cardiovascular;  Laterality: N/A;  . CARDIAC CATHETERIZATION N/A 03/25/2016   Procedure: Left Heart Cath and Coronary Angiography;  Surgeon: Osiris Odriscoll M [place], MD;  Location: [facility] INVASIVE CV LAB;  Service: Cardiovascular;  Laterality: N/A;  . CORONARY ANGIOPLASTY WITH STENT PLACEMENT  2010; 2013  . CORONARY BALLOON ANGIOPLASTY N/A 10/19/2016   Procedure: Coronary Balloon Angioplasty;  Surgeon: Corky Crafts, MD;  Location: [facility] INVASIVE CV LAB;  Service: Cardiovascular;  Laterality: N/A;  . INTRAVASCULAR ULTRASOUND/IVUS N/A 10/19/2016   Procedure: Intravascular Ultrasound/IVUS;  Surgeon: Corky Crafts, MD;  Location: [facility] INVASIVE CV LAB;  Service: Cardiovascular;  Laterality: N/A;  . LEFT HEART CATH AND CORONARY ANGIOGRAPHY N/A 10/19/2016   Procedure: Left Heart Cath and Coronary Angiography;  Surgeon: Corky Crafts, MD;  Location: [facility] INVASIVE CV LAB;  Service: Cardiovascular;  Laterality: N/A;     No outpatient medications have been marked as  taking for the 11/04/19 encounter (Appointment) with Wendall Stade, MD.     Allergies:   Patient has no known allergies.   Social History   Tobacco Use  . Smoking status: Former Smoker    Packs/day: 1.00    Years: 24.00    Pack years: 24.00    Quit date: 11/03/2016    Years since quitting: 2.9  . Smokeless tobacco: Never Used  . Tobacco comment: quit on 03/15/16  Substance Use Topics  . Alcohol use: No  . Drug use: Yes    Types: Marijuana    Comment: last time 02/2016     Family Hx: The patient's family history includes Arrhythmia in his mother; Coronary artery disease in his mother; Coronary artery disease (age of onset: 60) in his father; Heart attack in his brother, father, and mother; Heart disease in his father; Hypertension in his mother. There is no history of Stroke.  ROS:   Please see the history of present illness.    General:no colds or fevers, no weight changes Skin:no rashes or ulcers HEENT:no blurred vision, no  congestion CV:see HPI PUL:see HPI GI:no diarrhea constipation or melena, no indigestion GU:no hematuria, no dysuria MS:no joint pain, no claudication Neuro:no syncope, no lightheadedness Endo:no diabetes, no thyroid disease  All other systems reviewed and are negative.   Prior CV studies:   The following studies were reviewed today:  Cardiac MRI:  10/04/19    EF 26% anterior MI  Cardiac cath 10/19/16   Prox Cx to Mid Cx lesion, 10 %stenosed.  Prox RCA lesion, 100 %stenosed. Chronic total occlusion.  2nd Diag lesion, 45 %stenosed.  Prox LAD lesion, 100 %stenosed. This was the culprit lesion and treated with thrombectomy and angioplasty, > 4 mm. IVUS showed the vessel diameter at approximaltely 4 mm.  Post intervention, there is a 0% residual stenosis.  There is no aortic valve stenosis.  LV end diastolic pressure is severely elevated.   He needs aggressive risk factor modification. He needs to be compliant with dual antiplatelet therapy.  We elected not to place any additional stents since there were questions about compliance. There was inadequate result with just balloon dilatation with intravascular ultrasound guidance. He states he was taking his Brilinta. In the event that he had missed some doses, we gave a dose of intravenous tirofiban. He was again loaded with oral Brilinta. He'll need aggressive medical therapy for his systolic dysfunction. Elevated LVEDP. Hold off on IV fluids at this point.  ECHO 07/30/18 Study Conclusions  - Left ventricle: The cavity size was moderately dilated. Systolic   function was severely reduced. The estimated ejection fraction   was in the range of 20% to 25%. Diffuse hypokinesis. Features are   consistent with a pseudonormal left ventricular filling pattern,   with concomitant abnormal relaxation and increased filling   pressure (grade 2 diastolic dysfunction). Doppler parameters are   consistent with elevated ventricular end-diastolic filling   pressure. - Aortic valve: There was no regurgitation. - Mitral valve: There was mild regurgitation. - Left atrium: The atrium was mildly dilated. - Right ventricle: The cavity size was moderately dilated. Wall   thickness was normal. Systolic function was severely reduced. - Tricuspid valve: There was mild regurgitation. - Pulmonary arteries: Systolic pressure was mildly increased. PA   peak pressure: 36 mm Hg (S).  Impressions:  - Moderate biventricular dilatation with severe biventricular   systolic dysfunction. Severely elevated LVEDP. LVEF decreased   from 40% on 09/08/2016 to 20-25%.  Labs/Other Tests and Data Reviewed:    EKG: SR old anterior MI LAE 09/18/19   Recent Labs: 08/30/2019: ALT 25 09/17/2019: BUN 9; Creatinine, Ser 1.16; Hemoglobin 13.8; Platelets 242; Potassium 3.6; Sodium 139   Recent Lipid Panel Lab Results  Component Value Date/Time   CHOL 154 08/30/2019 11:32 AM   TRIG 330 (H) 08/30/2019 11:32 AM   HDL 27 (L)  08/30/2019 11:32 AM   CHOLHDL 5.7 (H) 08/30/2019 11:32 AM   CHOLHDL 8.5 10/19/2016 01:12 PM   LDLCALC 74 08/30/2019 11:32 AM    Wt Readings from Last 3 Encounters:  10/21/19 273 lb (123.8 kg)  08/30/19 278 lb 8 oz (126.3 kg)  08/27/19 280 lb (127 kg)     Objective:    Vital Signs:  There were no vitals taken for this visit.   VITAL SIGNS:  reviewed  Obese black male No respiratory distress JVP not visible No tachypnea   ASSESSMENT & PLAN:    1. Ischemic CM with EF 20-25% :  Cardiac MRI 26% with anterior wall scar 10/07/19  started on entresto 10/21/19  to have f/u in CHF clinic Possible right and left cath prior to entertaining idea of AICD  2. CAD, with hx of ant MI (STEMI) no angina  - with cath 10/2016 100% prox LAD occlusion s/p thrombectomy and PCTA. Dr. Irish Lack elected not to place any additional stents since there were questions about compliance at that time.  Known CTO RCA  See above   3. HTN Well controlled.  Continue current medications and low sodium Dash type diet.    4. HLD LDL 74 continue statin 08/30/19   5. Smoking:  Counseled on cessation no motivation to quit   6. OSA:  Sleep study pending   7. COVID:  Noted positive test 09/20/19  ***   Time:   Today, I have spent 30 minutes with the patient with telehealth technology discussing the above problems.     Medication Adjustments/Labs and Tests Ordered: Current medicines are reviewed at length with the patient today.  Concerns regarding medicines are outlined above.   Tests Ordered:  ***  Medication Changes:  ***  F/U :  CHF clinic and EP after that with me in 6 months   Signed, Jenkins Rouge, MD  10/21/2019 3:35 PM    [place]

## 2019-10-21 NOTE — Telephone Encounter (Signed)
Staff message sent to [place]. Ok to schedule sleep study. No PA is required. Patient has Medicare and Medicaid.

## 2019-10-21 NOTE — Patient Instructions (Signed)
Medication Instructions:  Your physician has recommended you make the following change in your medication:   **Stop Losartan  **Start Entresto 97/103mg  1 tablet by mouth twice daily  *If you need a refill on your cardiac medications before your next appointment, please call your pharmacy*   Lab Work: None ordered.  If you have labs (blood work) drawn today and your tests are completely normal, you will receive your results only by: Marland Kitchen MyChart Message (if you have MyChart) OR . A paper copy in the mail If you have any lab test that is abnormal or we need to change your treatment, we will call you to review the results.   Testing/Procedures: Your physician has recommended that you have a sleep study. This test records several body functions during sleep, including: brain activity, eye movement, oxygen and carbon dioxide blood levels, heart rate and rhythm, breathing rate and rhythm, the flow of air through your mouth and nose, snoring, body muscle movements, and chest and belly movement.   Follow-Up: At [facility], you and your health needs are our priority.  As part of our continuing mission to provide you with exceptional heart care, we have created designated Provider Care Teams.  These Care Teams include your primary Cardiologist (physician) and Advanced Practice Providers (APPs -  Physician Assistants and Nurse Practitioners) who all work together to provide you with the care you need, when you need it.  We recommend signing up for the patient portal called "MyChart".  Sign up information is provided on this After Visit Summary.  MyChart is used to connect with patients for Virtual Visits (Telemedicine).  Patients are able to view lab/test results, encounter notes, upcoming appointments, etc.  Non-urgent messages can be sent to your provider as well.   To learn more about what you can do with MyChart, go to [email or web address].    Your next appointment:   Monday June 21,2021  at 1045am  The format for your next appointment:   In person   Provider:   Dr Graciela Husbands

## 2019-10-22 ENCOUNTER — Telehealth: Payer: Self-pay | Admitting: *Deleted

## 2019-10-22 NOTE — Telephone Encounter (Signed)
-----   Message from Gaynelle Cage, CMA sent at 10/21/2019  1:06 PM EST ----- Regarding: RE: sleep study Ok to schedule. No PA is required. Patient has Medicare and Medicaid. ----- Message ----- From: Alois Cliche, RN Sent: 10/21/2019  10:12 AM EST To: Cv Div Sleep Studies Subject: sleep study                                    Please schedule pt for sleep study per Dr Graciela Husbands.  Pt is aware he will be contacted re: appointment.  Thank you,  Mindi Junker

## 2019-10-22 NOTE — Telephone Encounter (Signed)
Patient is scheduled for lab study on 11/09/19. Per sleep study dept. Patient should quarantine 3 days before coming for his sleep study. Patient understands his sleep study will be done at [facility] sleep lab. Patient understands he will receive a sleep packet in a week or so. Patient understands to call if he does not receive the sleep packet in a timely manner. Patient agrees with treatment and thanked me for call.

## 2019-10-29 ENCOUNTER — Telehealth: Payer: Self-pay | Admitting: Cardiovascular Disease

## 2019-10-29 NOTE — Telephone Encounter (Signed)
Left message for patient to call back. Will send a message to Pharm D to see if they can help as well.

## 2019-10-29 NOTE — Telephone Encounter (Signed)
New message   Pt c/o medication issue:  1. Name of Medication:sacubitril-valsartan (ENTRESTO) 97-103 MG  2. How are you currently taking this medication (dosage and times per day)? As written  3. Are you having a reaction (difficulty breathing--STAT)? No   4. What is your medication issue? Patient believes that the medication is causing dizziness and vision issues. Please call.

## 2019-10-29 NOTE — Telephone Encounter (Signed)
If he is taking Entresto 97/103 bid, decrease to 49/51 bid

## 2019-10-29 NOTE — Telephone Encounter (Signed)
Called patient back about his message and to give him advisement form Pharm D. Patient stated he feels dizzy and lightheaded when he stands up, and he has had blurred vision with starting Entresto. Informed patient that we can cut back to half a dose, per Pharm D and he can follow up next week. Patient has an appointment with Dr. Shirlee Latch on 11/05/19 and virtual visit with Dr. Eden Emms on 11/04/19. Canceled virtual visit with Dr. Eden Emms since he just saw Dr. Graciela Husbands and seeing Dr. Shirlee Latch in one week. Patient stated his BP has been fine. Would give patient samples of lower dose, but our office does not carry week supply of Entresto. Encouraged patient if lower dose does not help his symptoms to give our office a call back. Will send message to Dr. Shirlee Latch for further advisement.

## 2019-10-29 NOTE — Telephone Encounter (Signed)
Pt was changed from losartan to Entresto on 10/21/19 visit. Will need to confirm that pt has stopped his losartan (this is still on his med list) and is just taking Entresto 1 tablet twice a day. He was changed to the highest dose of Entresto which may be causing some of his dizziness. Has he been monitoring his BP at all? His goal is < 130/1mmHg. Could consider cutting back on Entresto dose to 49-51mg  twice daily for the next few days if his BP at home is low to see if he tolerates lower dose better. Sherryll Burger should not cause vision problems but if he is feeling dizzy frequently, he may notice some vision changes because of that.

## 2019-10-30 MED ORDER — ENTRESTO 49-51 MG PO TABS: 1.0000 | ORAL_TABLET | Freq: Two times a day (BID) | ORAL | 5 refills | Status: DC

## 2019-10-30 NOTE — Telephone Encounter (Signed)
Patient advised and verbalized understanding,new rx sent in for 49/51mg 

## 2019-11-04 ENCOUNTER — Telehealth: Payer: Medicare Other | Admitting: Cardiovascular Disease

## 2019-11-05 ENCOUNTER — Encounter (HOSPITAL_COMMUNITY): Payer: Medicare Other | Admitting: Cardiology

## 2019-11-07 ENCOUNTER — Other Ambulatory Visit (HOSPITAL_COMMUNITY): Payer: Medicare Other

## 2019-11-09 ENCOUNTER — Other Ambulatory Visit: Payer: Self-pay

## 2019-11-09 ENCOUNTER — Ambulatory Visit (HOSPITAL_BASED_OUTPATIENT_CLINIC_OR_DEPARTMENT_OTHER): Payer: Medicare Other | Attending: Internal Medicine | Admitting: Cardiology

## 2019-11-09 DIAGNOSIS — G4733 Obstructive sleep apnea (adult) (pediatric): Secondary | ICD-10-CM | POA: Diagnosis not present

## 2019-11-09 DIAGNOSIS — I255 Ischemic cardiomyopathy: Secondary | ICD-10-CM | POA: Insufficient documentation

## 2019-11-09 DIAGNOSIS — R0683 Snoring: Secondary | ICD-10-CM

## 2019-11-11 ENCOUNTER — Other Ambulatory Visit: Payer: Self-pay

## 2019-11-12 ENCOUNTER — Other Ambulatory Visit: Payer: Self-pay | Admitting: Cardiovascular Disease

## 2019-11-13 ENCOUNTER — Other Ambulatory Visit: Payer: Self-pay | Admitting: Nurse Practitioner

## 2019-11-18 NOTE — Procedures (Signed)
Patient Name: Peter Russo, Peter Russo Date: 11/09/2019 Gender: Male D.O.B: 1976-05-31 Age (years): 43 Referring Provider: Virl Axe Height (inches): 64 Interpreting Physician: Fransico Him MD, ABSM Weight (lbs): 273 RPSGT: Lanae Boast BMI: 43 MRN: [id number] Neck Size: 18.50  CLINICAL INFORMATION Sleep Study Type: Split Night CPAP  Indication for sleep study: Obesity, Snoring  Epworth Sleepiness Score: 4  SLEEP STUDY TECHNIQUE As per the AASM Manual for the Scoring of Sleep and Associated Events v2.3 (April 2016) with a hypopnea requiring 4% desaturations.  The channels recorded and monitored were frontal, central and occipital EEG, electrooculogram (EOG), submentalis EMG (chin), nasal and oral airflow, thoracic and abdominal wall motion, anterior tibialis EMG, snore microphone, electrocardiogram, and pulse oximetry. Continuous positive airway pressure (CPAP) was initiated when the patient met split night criteria and was titrated according to treat sleep-disordered breathing.  MEDICATIONS Medications self-administered by patient taken the night of the study : N/A  RESPIRATORY PARAMETERS Diagnostic Total AHI (/hr): 66.5  RDI (/hr):68.7  OA Index (/hr): 24.5  CA Index (/hr): 8.0 REM AHI (/hr): N/A  NREM AHI (/hr):66.5  Supine AHI (/hr):78.4  Non-supine AHI (/hr): 63.5 Min O2 Sat (%):82.0  Mean O2 (%): 94.3  Time below 88% (min):9   Titration Optimal Pressure (cm):N/A  AHI at Optimal Pressure (/hr):N/A  Min O2 at Optimal Pressure (%):91.0 Supine % at Optimal (%):N/A  Sleep % at Optimal (%):N/A   SLEEP ARCHITECTURE The recording time for the entire night was 367.5 minutes.  During a baseline period of 188.0 minutes, the patient slept for 134.5 minutes in REM and nonREM, yielding a sleep efficiency of 71.5%. Sleep onset after lights out was 13.0 minutes with a REM latency of N/A minutes. The patient spent 58.7% of the night in stage N1 sleep, 41.3% in  stage N2 sleep, 0.0% in stage N3 and 0% in REM.  During the titration period of 177.6 minutes, the patient slept for 153.5 minutes in REM and nonREM, yielding a sleep efficiency of 86.5%. Sleep onset after CPAP initiation was 0.0 minutes with a REM latency of 18.0 minutes. The patient spent 23.2% of the night in stage N1 sleep, 56.0% in stage N2 sleep, 0.0% in stage N3 and 20.8% in REM.  CARDIAC DATA The 2 lead EKG demonstrated sinus rhythm. The mean heart rate was 100.0 beats per minute. Other EKG findings include: None.  LEG MOVEMENT DATA The total Periodic Limb Movements of Sleep (PLMS) were 0. The PLMS index was 0.0 .  IMPRESSIONS - Severe obstructive sleep apnea occurred during the diagnostic portion of the study (AHI = 66.5/hour). An optimal PAP pressure could not be selected for this patient based on the available study data. - Mild central sleep apnea occurred during the diagnostic portion of the study (CAI = 8.0/hour). - The patient had oxygen desaturation during the diagnostic portion of the study (Min O2 = 82.0%) - The patient snored with loud snoring volume during the diagnostic portion of the study. - No cardiac abnormalities were noted during this study. - Clinically significant periodic limb movements did not occur during sleep.  DIAGNOSIS - Obstructive Sleep Apnea (327.23 [G47.33 ICD-10])  RECOMMENDATIONS - Recommend BiPAP titration given sub-optimal CPAP titration. - Avoid alcohol, sedatives and other CNS depressants that may worsen sleep apnea and disrupt normal sleep architecture. - Sleep hygiene should be reviewed to assess factors that may improve sleep quality. - Weight management and regular exercise should be initiated or continued. - Return to Sleep Center for re-evaluation after  8 weeks of therapy  [Electronically signed] 11/18/2019 09:17 PM  Fransico Him MD, ABSM Diplomate, American Board of Sleep Medicine

## 2019-11-21 ENCOUNTER — Telehealth: Payer: Self-pay | Admitting: *Deleted

## 2019-11-21 DIAGNOSIS — I10 Essential (primary) hypertension: Secondary | ICD-10-CM

## 2019-11-21 NOTE — Telephone Encounter (Signed)
-----   Message from Quintella Reichert, MD sent at 11/18/2019  9:22 PM EDT ----- Please let patient know that they have significant sleep apnea but unsuccessful CPAP titration due to severity of OSA.  Please set up in lab study for BiPAP titration

## 2019-11-21 NOTE — Telephone Encounter (Signed)
Informed patient of sleep study results and patient understanding was verbalized. Patient understands his sleep study showed they have significant sleep apnea but unsuccessful CPAP titration due to severity of OSA. Please set up in lab study for BiPAP titration. Pt is aware and agreeable to his results.  Bipap titration sent to sleep pool.

## 2019-11-28 NOTE — Addendum Note (Signed)
Addended by: Reesa Chew on: 11/28/2019 10:20 AM   Modules accepted: Orders

## 2019-11-29 ENCOUNTER — Other Ambulatory Visit: Payer: Self-pay | Admitting: Nurse Practitioner

## 2019-12-01 ENCOUNTER — Other Ambulatory Visit: Payer: Self-pay | Admitting: Cardiovascular Disease

## 2019-12-02 NOTE — Telephone Encounter (Addendum)
Patient is scheduled for BiPAP Titration on 12/21/19. pt is scheduled for COVID screening on 5/6//21 2 pm prior to titration.  Patient understands his titration study will be done at [facility] sleep lab. Patient understands he will receive a letter in a week or so detailing appointment, date, time, and location. Patient understands to call if he does not receive the letter  in a timely manner. Patient agrees with treatment and thanked me for call.

## 2019-12-03 ENCOUNTER — Telehealth: Payer: Self-pay | Admitting: Cardiovascular Disease

## 2019-12-03 MED ORDER — BUPROPION HCL ER (XL) 150 MG PO TB24: 150.0000 mg | ORAL_TABLET | Freq: Every day | ORAL | 3 refills | Status: DC

## 2019-12-03 NOTE — Telephone Encounter (Signed)
*  STAT* If patient is at the pharmacy, call can be transferred to refill team.   1. Which medications need to be refilled? (please list name of each medication and dose if known) buPROPion (WELLBUTRIN XL) 150 MG 24 hr tablet  2. Which pharmacy/location (including street and city if local pharmacy) is medication to be sent to? [id number] - [place], [place] - [address]  3. Do they need a 30 day or 90 day supply? 90  Patient is out of medication, he states that the pharmacy has not received the prescription.

## 2019-12-03 NOTE — Telephone Encounter (Signed)
Pt's pharmacy is requesting a refill on bupropion 150 mg tablet. Would Dr. Eden Emms like to refill this medication? Please address

## 2019-12-04 NOTE — Telephone Encounter (Signed)
Patient states he takes the medication 2x daily

## 2019-12-04 NOTE — Telephone Encounter (Signed)
Called pt and pt stated that he takes bupropion 150 mg tablet BID and this medication was sent in daily. Would you like to prescribe this medication as pt taking BID? Please address

## 2019-12-04 NOTE — Telephone Encounter (Signed)
I didn't initially prescribe Wellbutrin he should get primary to do if he wants to have it bid

## 2019-12-05 NOTE — Telephone Encounter (Signed)
Called patient to let him know Dr. Eden Emms can fill Bupropion once a day, but not twice daily. Informed him that he would need to follow up with his PCP to discuss taking it twice daily.

## 2019-12-13 ENCOUNTER — Other Ambulatory Visit (HOSPITAL_COMMUNITY): Payer: Medicare Other

## 2019-12-16 ENCOUNTER — Encounter (HOSPITAL_BASED_OUTPATIENT_CLINIC_OR_DEPARTMENT_OTHER): Payer: Medicare Other | Admitting: Cardiology

## 2019-12-17 ENCOUNTER — Encounter (HOSPITAL_COMMUNITY): Payer: Medicare Other | Admitting: Cardiology

## 2019-12-19 ENCOUNTER — Other Ambulatory Visit (HOSPITAL_COMMUNITY)
Admission: RE | Admit: 2019-12-19 | Discharge: 2019-12-19 | Disposition: A | Discharge status: Home / Self Care | Payer: Medicare Other | Source: Ambulatory Visit | Attending: Cardiology | Admitting: Cardiology

## 2019-12-19 DIAGNOSIS — Z01812 Encounter for preprocedural laboratory examination: Secondary | ICD-10-CM | POA: Diagnosis not present

## 2019-12-19 DIAGNOSIS — Z20822 Contact with and (suspected) exposure to covid-19: Secondary | ICD-10-CM | POA: Diagnosis not present

## 2019-12-19 LAB — SARS CORONAVIRUS 2 (TAT 6-24 HRS): SARS Coronavirus 2: NEGATIVE

## 2019-12-21 ENCOUNTER — Ambulatory Visit (HOSPITAL_BASED_OUTPATIENT_CLINIC_OR_DEPARTMENT_OTHER): Payer: Medicare Other | Admitting: Cardiology

## 2019-12-21 ENCOUNTER — Other Ambulatory Visit: Payer: Self-pay | Admitting: Cardiovascular Disease

## 2020-01-07 ENCOUNTER — Other Ambulatory Visit: Payer: Self-pay

## 2020-01-07 ENCOUNTER — Emergency Department: Payer: Medicare Other

## 2020-01-07 ENCOUNTER — Encounter: Payer: Self-pay | Admitting: Emergency Medicine

## 2020-01-07 DIAGNOSIS — Z5321 Procedure and treatment not carried out due to patient leaving prior to being seen by health care provider: Secondary | ICD-10-CM | POA: Diagnosis not present

## 2020-01-07 DIAGNOSIS — R079 Chest pain, unspecified: Secondary | ICD-10-CM | POA: Insufficient documentation

## 2020-01-07 LAB — COMPREHENSIVE METABOLIC PANEL
ALT: 28 U/L (ref 0–44)
AST: 23 U/L (ref 15–41)
Albumin: 3.8 g/dL (ref 3.5–5.0)
Alkaline Phosphatase: 81 U/L (ref 38–126)
Anion gap: 10 (ref 5–15)
BUN: 15 mg/dL (ref 6–20)
CO2: 25 mmol/L (ref 22–32)
Calcium: 9.1 mg/dL (ref 8.9–10.3)
Chloride: 100 mmol/L (ref 98–111)
Creatinine, Ser: 1.19 mg/dL (ref 0.61–1.24)
GFR calc Af Amer: >60 mL/min (ref >60)
GFR calc non Af Amer: >60 mL/min (ref >60)
Glucose, Bld: 259 mg/dL — ABNORMAL HIGH (ref 70–99)
Potassium: 3.9 mmol/L (ref 3.5–5.1)
Sodium: 135 mmol/L (ref 135–145)
Total Bilirubin: 0.6 mg/dL (ref 0.3–1.2)
Total Protein: 7.6 g/dL (ref 6.5–8.1)

## 2020-01-07 LAB — CBC WITH DIFFERENTIAL/PLATELET
Abs Immature Granulocytes: 0.11 10*3/uL — ABNORMAL HIGH (ref 0.00–0.07)
Basophils Absolute: 0.1 10*3/uL (ref 0.0–0.1)
Basophils Relative: 1 %
Eosinophils Absolute: 0.3 10*3/uL (ref 0.0–0.5)
Eosinophils Relative: 3 %
HCT: 41.6 % (ref 39.0–52.0)
Hemoglobin: 15.2 g/dL (ref 13.0–17.0)
Immature Granulocytes: 1 %
Lymphocytes Relative: 24 %
Lymphs Abs: 2.6 10*3/uL (ref 0.7–4.0)
MCH: 30.1 pg (ref 26.0–34.0)
MCHC: 36.5 g/dL — ABNORMAL HIGH (ref 30.0–36.0)
MCV: 82.4 fL (ref 80.0–100.0)
Monocytes Absolute: 0.9 10*3/uL (ref 0.1–1.0)
Monocytes Relative: 8 %
Neutro Abs: 7 10*3/uL (ref 1.7–7.7)
Neutrophils Relative %: 63 %
Platelets: 209 10*3/uL (ref 150–400)
RBC: 5.05 MIL/uL (ref 4.22–5.81)
RDW: 13.5 % (ref 11.5–15.5)
WBC: 10.9 10*3/uL — ABNORMAL HIGH (ref 4.0–10.5)
nRBC: 0 % (ref 0.0–0.2)

## 2020-01-07 LAB — TROPONIN I (HIGH SENSITIVITY): Troponin I (High Sensitivity): <2 ng/L (ref <18)

## 2020-01-07 NOTE — ED Triage Notes (Signed)
Patient ambulatory to triage with steady gait, without difficulty or distress noted, mask in place; pt reports mid CP, nonradiating with no accomp symptoms x 

## 2020-01-08 ENCOUNTER — Emergency Department
Admission: EM | Admit: 2020-01-08 | Discharge: 2020-01-08 | Disposition: A | Discharge status: Home / Self Care | Payer: Medicare Other | Attending: Emergency Medicine | Admitting: Emergency Medicine

## 2020-01-08 NOTE — ED Notes (Signed)
Pt has not returned to ED lobby 

## 2020-01-08 NOTE — ED Notes (Signed)
Pt reports that he is going to wait outside until exam room is ready

## 2020-01-08 NOTE — ED Notes (Signed)
Called pt on phone to return inside for reeval; st coming in now

## 2020-01-09 ENCOUNTER — Telehealth: Payer: Self-pay | Admitting: Emergency Medicine

## 2020-01-09 ENCOUNTER — Other Ambulatory Visit (HOSPITAL_COMMUNITY)
Admission: RE | Admit: 2020-01-09 | Discharge: 2020-01-09 | Disposition: A | Discharge status: Home / Self Care | Payer: Medicare Other | Source: Ambulatory Visit | Attending: Cardiology | Admitting: Cardiology

## 2020-01-09 DIAGNOSIS — Z20822 Contact with and (suspected) exposure to covid-19: Secondary | ICD-10-CM | POA: Insufficient documentation

## 2020-01-09 DIAGNOSIS — Z01812 Encounter for preprocedural laboratory examination: Secondary | ICD-10-CM | POA: Insufficient documentation

## 2020-01-09 LAB — SARS CORONAVIRUS 2 (TAT 6-24 HRS): SARS Coronavirus 2: NEGATIVE

## 2020-01-09 NOTE — Telephone Encounter (Signed)
Called patient due to lwot to inquire about condition and follow up plans. He has not notified pcp yet, but says he will.  I told him that labs were completed with no critical values, but that without seeing a doctor we have not ruled out heart problem.

## 2020-01-11 ENCOUNTER — Ambulatory Visit (HOSPITAL_BASED_OUTPATIENT_CLINIC_OR_DEPARTMENT_OTHER): Payer: Medicare Other | Attending: Internal Medicine | Admitting: Cardiology

## 2020-01-11 ENCOUNTER — Other Ambulatory Visit: Payer: Self-pay

## 2020-01-11 VITALS — Ht 71.0 in | Wt 280.0 lb

## 2020-01-11 DIAGNOSIS — I10 Essential (primary) hypertension: Secondary | ICD-10-CM | POA: Insufficient documentation

## 2020-01-11 DIAGNOSIS — I493 Ventricular premature depolarization: Secondary | ICD-10-CM | POA: Diagnosis not present

## 2020-01-11 DIAGNOSIS — G4733 Obstructive sleep apnea (adult) (pediatric): Secondary | ICD-10-CM | POA: Insufficient documentation

## 2020-01-14 NOTE — Procedures (Signed)
    Patient Name: Peter Russo, Peter Russo Date: 01/11/2020 Gender: Male D.O.B: 11-Oct-1975 Age (years): 43 Referring Provider: Sherryl Manges Height (inches): 67 Interpreting Physician: Armanda Magic MD, ABSM Weight (lbs): 273 RPSGT: Lise Auer BMI: 43 MRN: [id number] Neck Size: 18.50  CLINICAL INFORMATION The patient is referred for a BiPAP titration to treat sleep apnea.  SLEEP STUDY TECHNIQUE As per the AASM Manual for the Scoring of Sleep and Associated Events v2.3 (April 2016) with a hypopnea requiring 4% desaturations.  The channels recorded and monitored were frontal, central and occipital EEG, electrooculogram (EOG), submentalis EMG (chin), nasal and oral airflow, thoracic and abdominal wall motion, anterior tibialis EMG, snore microphone, electrocardiogram, and pulse oximetry. Bilevel positive airway pressure (BPAP) was initiated at the beginning of the study and titrated to treat sleep-disordered breathing.  MEDICATIONS Medications self-administered by patient taken the night of the study : N/A  RESPIRATORY PARAMETERS Optimal IPAP Pressure (cm): 16  AHI at Optimal Pressure (/hr) 1.6 Optimal EPAP Pressure (cm):12  Overall Minimal O2 (%):69.0  Minimal O2 at Optimal Pressure (%): 92.0  SLEEP ARCHITECTURE Start Time:11:24:28 PM  Stop Time:5:52:44 AM  Total Time (min):388.3 Total Sleep Time (min):325.8 Sleep Latency (min):3.4  Sleep Efficiency (%):83.9%  REM Latency (min):112.0  WASO (min):59.0 Stage N1 (%):21.9%  Stage N2 (%):63.5%  Stage N3 (%):0.0%  Stage R (%):14.6 Supine (%):89.10  Arousal Index (/hr):28.0   CARDIAC DATA The 2 lead EKG demonstrated sinus rhythm. The mean heart rate was 69.1 beats per minute. Other EKG findings include: PVCs.  LEG MOVEMENT DATA The total Periodic Limb Movements of Sleep (PLMS) were 0. The PLMS index was 0.0. A PLMS index of <15 is considered normal in adults.  IMPRESSIONS - An optimal PAP pressure was selected  for this patient ( 16 / cm of water) - Central sleep apnea was not noted during this titration (CAI = 0.0/h). - Severe oxygen desaturations were observed during this titration (min O2 = 69.0%). - The patient snored with moderate snoring volume. - 2-lead EKG demonstrated: PVCs - Clinically significant periodic limb movements were not noted during this study. Arousals associated with PLMs were rare.  DIAGNOSIS - Obstructive Sleep Apnea (327.23 [G47.33 ICD-10])  RECOMMENDATIONS - Trial of BiPAP therapy on 16/12 cm H2O with a Medium size Resmed Full Face Mask AirFit F20 mask and heated humidification. - Avoid alcohol, sedatives and other CNS depressants that may worsen sleep apnea and disrupt normal sleep architecture. - Sleep hygiene should be reviewed to assess factors that may improve sleep quality. - Weight management and regular exercise should be initiated or continued. - Return to Sleep Center for re-evaluation after 8 weeks of therapy  [Electronically signed] 01/14/2020 04:04 PM  Armanda Magic MD, ABSM Diplomate, American Board of Sleep Medicine

## 2020-01-20 ENCOUNTER — Telehealth: Payer: Self-pay | Admitting: *Deleted

## 2020-01-20 NOTE — Telephone Encounter (Signed)
Informed patient of titration results and verbalized understanding was indicated. Patient understands his sleep study showed they had a successful PAP titration and let DME know that orders are in EPIC. Please set up 8 week OV with me.   Upon patient request DME selection is [facility]. Patient understands he will be contacted by [facility] to set up his cpap. Patient understands to call if [facility] does not contact him with new setup in a timely manner. Patient understands they will be called once confirmation has been received from [facility] that they have received their new machine to schedule 10 week follow up appointment.  [facility] notified of new cpap order  Please add to airview Patient was grateful for the call and thanked me.

## 2020-01-20 NOTE — Telephone Encounter (Signed)
-----   Message from Quintella Reichert, MD sent at 01/14/2020  4:07 PM EDT ----- Please let patient know that they had a successful PAP titration and let DME know that orders are in EPIC.  Please set up 8 week OV with me.

## 2020-02-02 ENCOUNTER — Other Ambulatory Visit: Payer: Self-pay | Admitting: Nurse Practitioner

## 2020-02-03 ENCOUNTER — Ambulatory Visit (INDEPENDENT_AMBULATORY_CARE_PROVIDER_SITE_OTHER): Payer: Medicare Other | Admitting: Internal Medicine

## 2020-02-03 ENCOUNTER — Other Ambulatory Visit: Payer: Self-pay

## 2020-02-03 ENCOUNTER — Encounter: Payer: Self-pay | Admitting: Internal Medicine

## 2020-02-03 VITALS — BP 120/78 | HR 86 | Ht 67.0 in | Wt 274.6 lb

## 2020-02-03 DIAGNOSIS — I255 Ischemic cardiomyopathy: Secondary | ICD-10-CM | POA: Diagnosis not present

## 2020-02-03 DIAGNOSIS — I5042 Chronic combined systolic (congestive) and diastolic (congestive) heart failure: Secondary | ICD-10-CM

## 2020-02-03 NOTE — Patient Instructions (Signed)
Medication Instructions:  Your physician recommends that you continue on your current medications as directed. Please refer to the Current Medication list given to you today.  *If you need a refill on your cardiac medications before your next appointment, please call your pharmacy*   Lab Work: None ordered.  If you have labs (blood work) drawn today and your tests are completely normal, you will receive your results only by: MyChart Message (if you have MyChart) OR A paper copy in the mail If you have any lab test that is abnormal or we need to change your treatment, we will call you to review the results.   Testing/Procedures:  Your physician has requested that you have an echocardiogram. Echocardiography is a painless test that uses sound waves to create images of your heart. It provides your doctor with information about the size and shape of your heart and how well your heart's chambers and valves are working. This procedure takes approximately one hour. There are no restrictions for this procedure.    Follow-Up: At [facility], you and your health needs are our priority.  As part of our continuing mission to provide you with exceptional heart care, we have created designated Provider Care Teams.  These Care Teams include your primary Cardiologist (physician) and Advanced Practice Providers (APPs -  Physician Assistants and Nurse Practitioners) who all work together to provide you with the care you need, when you need it.  We recommend signing up for the patient portal called "MyChart".  Sign up information is provided on this After Visit Summary.  MyChart is used to connect with patients for Virtual Visits (Telemedicine).  Patients are able to view lab/test results, encounter notes, upcoming appointments, etc.  Non-urgent messages can be sent to your provider as well.   To learn more about what you can do with MyChart, go to [email or web address].    Your next appointment:   6  month(s)  The format for your next appointment:   In Person  Provider:   Steven Klein, MD  

## 2020-02-03 NOTE — Progress Notes (Signed)
Patient Care Team: Hoy Register, MD as PCP - General (Family Medicine) Wendall Stade, MD as PCP - Cardiology (Cardiology)   HPI  Peter Russo is a 44 y.o. male seen in follow-up for reconsideration of an ICD in the setting of ischemic cardiomyopathy.  History of premature coronary artery disease with LAD stenting 2010, RCA stenting 2012 with issues of noncompliance related at least in part to affordability of medications.  DATE TEST EF   2/17 Echo  35-40%   3/18 LHC*   LADp T-PCI; CXp-10%; RCAp-T (CTO)  12/19 Echo   20.25 %   2/21 cMRI 26% Anteroapical fullthickness LGE Inferior HK     Date Cr K LDL Hgb  2/21 1.16 3.6  74 13.8          Sleep disordered breathing recently started on CPAP; dry mouth issues  Mild respiratory limitations; no chest pain.  No edema.  Still not sure what he wants to do about an ICD.  Records and Results Reviewed   Past Medical History:  Diagnosis Date  . Anxiety   . CAD S/P percutaneous coronary angioplasty    a. s/p BMS-mLAD 2010. b. DES to RCA 2012. c. 04/2015: DES to LCx and PCI to LAD c/b dissection with subsequent overlapping DES to mLAD - r/i for NSTEMI afterwards; d. 08/2015 Ant STEMI in setting of noncompliance->174mLAD ISR (3.0x16 Synergy DES); e. 03/2016 PCI of apical LAD; f. 03/2016 Relook Cath: patent LAD stents, RCA 100p CTO-->Med Rx; g. 10/2016 Ant STEMI: PTCA 100p LAD.  Marland Kitchen Chronic combined systolic and diastolic CHF (congestive heart failure) (HCC)    a. 03/2016 Echo: EF 30-35%, Gr2 DD;  b. 08/2016 Echo: EF 40%.  . Depression   . Essential hypertension   . GAD (generalized anxiety disorder)   . GERD (gastroesophageal reflux disease)   . Hypercholesteremia   . Ischemic cardiomyopathy    a. prior EF 25-35 percent at cath, 35-40 percent by echo; b. 03/2016 Echo: EF 30-35%, diff HK, Gr2 DD; c. 08/2016 Echo: EF 40%, base/mid inferior/inferowseptal AK, mildly dil LA.  . Morbid obesity (HCC)   . Pre-diabetes   . SVT  (supraventricular tachycardia) (HCC) APRROX 10 YRS AGO   PRESUMED AVNRT, ECHO 12/07 WITH EF 65%, MILD LAE  . Tobacco abuse    a. 20 + pack years, quit 10/2016.    Past Surgical History:  Procedure Laterality Date  . CARDIAC CATHETERIZATION N/A 04/23/2015   Procedure: Left Heart Cath and Coronary Angiography;  Surgeon: Wendall Stade, MD;  Location: [facility] INVASIVE CV LAB;  Service: Cardiovascular;  Laterality: N/A;  . CARDIAC CATHETERIZATION N/A 04/23/2015   Procedure: Coronary Stent Intervention;  Surgeon: Tonny Bollman, MD;  Location: [facility] INVASIVE CV LAB;  Service: Cardiovascular;  Laterality: N/A;  . CARDIAC CATHETERIZATION N/A 09/14/2015   Procedure: Left Heart Cath and Coronary Angiography;  Surgeon: Lyn Records, MD;  LAD 100%, CFX 10% ISR, RCA 100% (chronic), EF 25-35%  . CARDIAC CATHETERIZATION N/A 09/14/2015   Procedure: Coronary Stent Intervention;  Surgeon: Lyn Records, MD;  Location[facility] INVASIVE CV LAB;  Service: Cardiovascular;  Laterality: N/A; 3.0 x 16 mm Synergy DES LAD  . CARDIAC CATHETERIZATION  03/16/2016  . CARDIAC CATHETERIZATION N/A 03/16/2016   Procedure: Left Heart Cath and Coronary Angiography;  Surgeon: Lyn Records, MD;  Location: [facility] INVASIVE CV LAB;  Service: Cardiovascular;  Laterality: N/A;  . CARDIAC CATHETERIZATION N/A 03/16/2016   Procedure: Coronary Balloon Angioplasty;  Surgeon:  Belva Crome, MD;  Location: [place] CV LAB;  Service: Cardiovascular;  Laterality: N/A;  . CARDIAC CATHETERIZATION N/A 03/25/2016   Procedure: Left Heart Cath and Coronary Angiography;  Surgeon: Peter M Martinique, MD;  Location: [place] CV LAB;  Service: Cardiovascular;  Laterality: N/A;  . CORONARY ANGIOPLASTY WITH STENT PLACEMENT  2010; 2013  . CORONARY BALLOON ANGIOPLASTY N/A 10/19/2016   Procedure: Coronary Balloon Angioplasty;  Surgeon: Jettie Booze, MD;  Location: [place] CV LAB;  Service: Cardiovascular;  Laterality: N/A;  . INTRAVASCULAR ULTRASOUND/IVUS N/A 10/19/2016    Procedure: Intravascular Ultrasound/IVUS;  Surgeon: Jettie Booze, MD;  Location: [place] CV LAB;  Service: Cardiovascular;  Laterality: N/A;  . LEFT HEART CATH AND CORONARY ANGIOGRAPHY N/A 10/19/2016   Procedure: Left Heart Cath and Coronary Angiography;  Surgeon: Jettie Booze, MD;  Location: [place] CV LAB;  Service: Cardiovascular;  Laterality: N/A;    Current Meds  Medication Sig  . acetaminophen (TYLENOL) 325 MG tablet Take 2 tablets (650 mg total) by mouth every 4 (four) hours as needed for headache or mild pain.  Marland Kitchen aspirin 81 MG EC tablet Take 1 tablet (81 mg total) by mouth daily.  Marland Kitchen atorvastatin (LIPITOR) 80 MG tablet TAKE 1 TABLET BY MOUTH EVERY DAY AT 6 PM  . buPROPion (WELLBUTRIN XL) 150 MG 24 hr tablet Take 1 tablet (150 mg total) by mouth daily.  . furosemide (LASIX) 40 MG tablet Take 1 tablet (40 mg total) by mouth daily.  Marland Kitchen HYDROcodone-acetaminophen (NORCO/VICODIN) 5-325 MG tablet Take 1 tablet by mouth every 6 (six) hours as needed for moderate pain or severe pain.  . nitroGLYCERIN (NITROSTAT) 0.4 MG SL tablet PLACE 1 TABLET UNDER TONGUE EVERY 5 MINS, UP TO 3 DOSES AS NEEDED FOR CHEST PAIN  . sacubitril-valsartan (ENTRESTO) 49-51 MG Take 1 tablet by mouth 2 (two) times daily.  Marland Kitchen spironolactone (ALDACTONE) 25 MG tablet Take 1 tablet (25 mg total) by mouth daily.  . ticagrelor (BRILINTA) 90 MG TABS tablet Take 1 tablet (90 mg total) by mouth 2 (two) times daily.    No Known Allergies    Review of Systems negative except from HPI and PMH  Physical Exam BP 120/78   Pulse 86   Ht 5\' 7"  (1.702 m)   Wt 274 lb 9.6 oz (124.6 kg)   SpO2 96%   BMI 43.01 kg/m  Well developed and well nourished in no acute distress HENT normal E scleral and icterus clear Neck Supple JVP flat; carotids brisk and full Clear to ausculation  Regular rate and rhythm, no murmurs gallops or rub Soft with active bowel sounds No clubbing cyanosis  Edema Alert and oriented,  grossly normal motor and sensory function Skin Warm and Dry  ECG sinus at 86 Interval 16/11/40 Biatrial enlargement  CrCl cannot be calculated (Patient's most recent lab result is older than the maximum 21 days allowed.).   Assessment and  Plan  Ischemic cardiomyopathy  Congestive heart failure chronic systolic class IIb-IIIa  Obesity  Sleep disordered breathing  Abnormal ECG  Anxiety  Currently still undecided regarding an ICD.  Will reassess LV function by echo.  Continue current medications for now.  We will reach out to Dr. Eugene Garnet regarding dry mouth with his CPAP.  Question humidifier system.  Anxi often ety still an issue with his  Discussed Covid vaccine.  He is Covid positive; encouraged him to follow through on vaccination    Current medicines are reviewed at length  with the patient today .  The patient does not  have concerns regarding medicines.

## 2020-02-12 ENCOUNTER — Ambulatory Visit: Payer: Medicare Other | Attending: Physician Assistant | Admitting: Physician Assistant

## 2020-02-12 ENCOUNTER — Other Ambulatory Visit: Payer: Self-pay

## 2020-02-12 ENCOUNTER — Encounter: Payer: Self-pay | Admitting: Physician Assistant

## 2020-02-12 DIAGNOSIS — Z789 Other specified health status: Secondary | ICD-10-CM | POA: Diagnosis not present

## 2020-02-12 MED ORDER — CLOTRIMAZOLE-BETAMETHASONE 1-0.05 % EX CREA: 1.0000 "application " | TOPICAL_CREAM | Freq: Two times a day (BID) | CUTANEOUS | 0 refills | Status: DC

## 2020-02-12 NOTE — Progress Notes (Signed)
Penile pain X 1 month

## 2020-02-12 NOTE — Progress Notes (Signed)
Virtual Visit via Telephone Note  I connected with Peter Russo on 02/12/20 at  3:50 PM EDT by telephone and verified that I am speaking with the correct person using two identifiers.   I discussed the limitations, risks, security and privacy concerns of performing an evaluation and management service by telephone and the availability of in person appointments. I also discussed with the patient that there may be a patient responsible charge related to this service. The patient expressed understanding and agreed to proceed.  PATIENT visit by telephone virtually in the context of Covid-19 pandemic. Patient location:  Home  My Location:  [facility] office Persons on the call: me and the patient  History of Present Illness:  Patient interested in circumcision and has some broken and painful skin in the foreskin area.  He has an appt with urology in [place] July 14.  No dysuria.  No penile discharge.     Observations/Objective:  NAD.  A&Ox3   Assessment and Plan: 1. Uncircumcised male Can try lotrisone until appt.  Keep appt with urology    Follow Up Instructions: See PCP in 4 months   I discussed the assessment and treatment plan with the patient. The patient was provided an opportunity to ask questions and all were answered. The patient agreed with the plan and demonstrated an understanding of the instructions.   The patient was advised to call back or seek an in-person evaluation if the symptoms worsen or if the condition fails to improve as anticipated.  I provided 9 minutes of non-face-to-face time during this encounter.   Georgian Co, PA-C  Patient ID: Peter Russo, male   DOB: October 30, 1975, 44 y.o.   MRN: [id number]

## 2020-02-14 IMAGING — DX DG CHEST 2V
2 series · 2 of 2 positions shown · non-contrast
Comparison: Chest x-ray of November 17, 2016

CLINICAL DATA: Acute onset of shortness of breath today associated
with cough. History of CHF, coronary artery disease, previous MI,
current smoker.

EXAM:
CHEST - 2 VIEW

[chest pa]
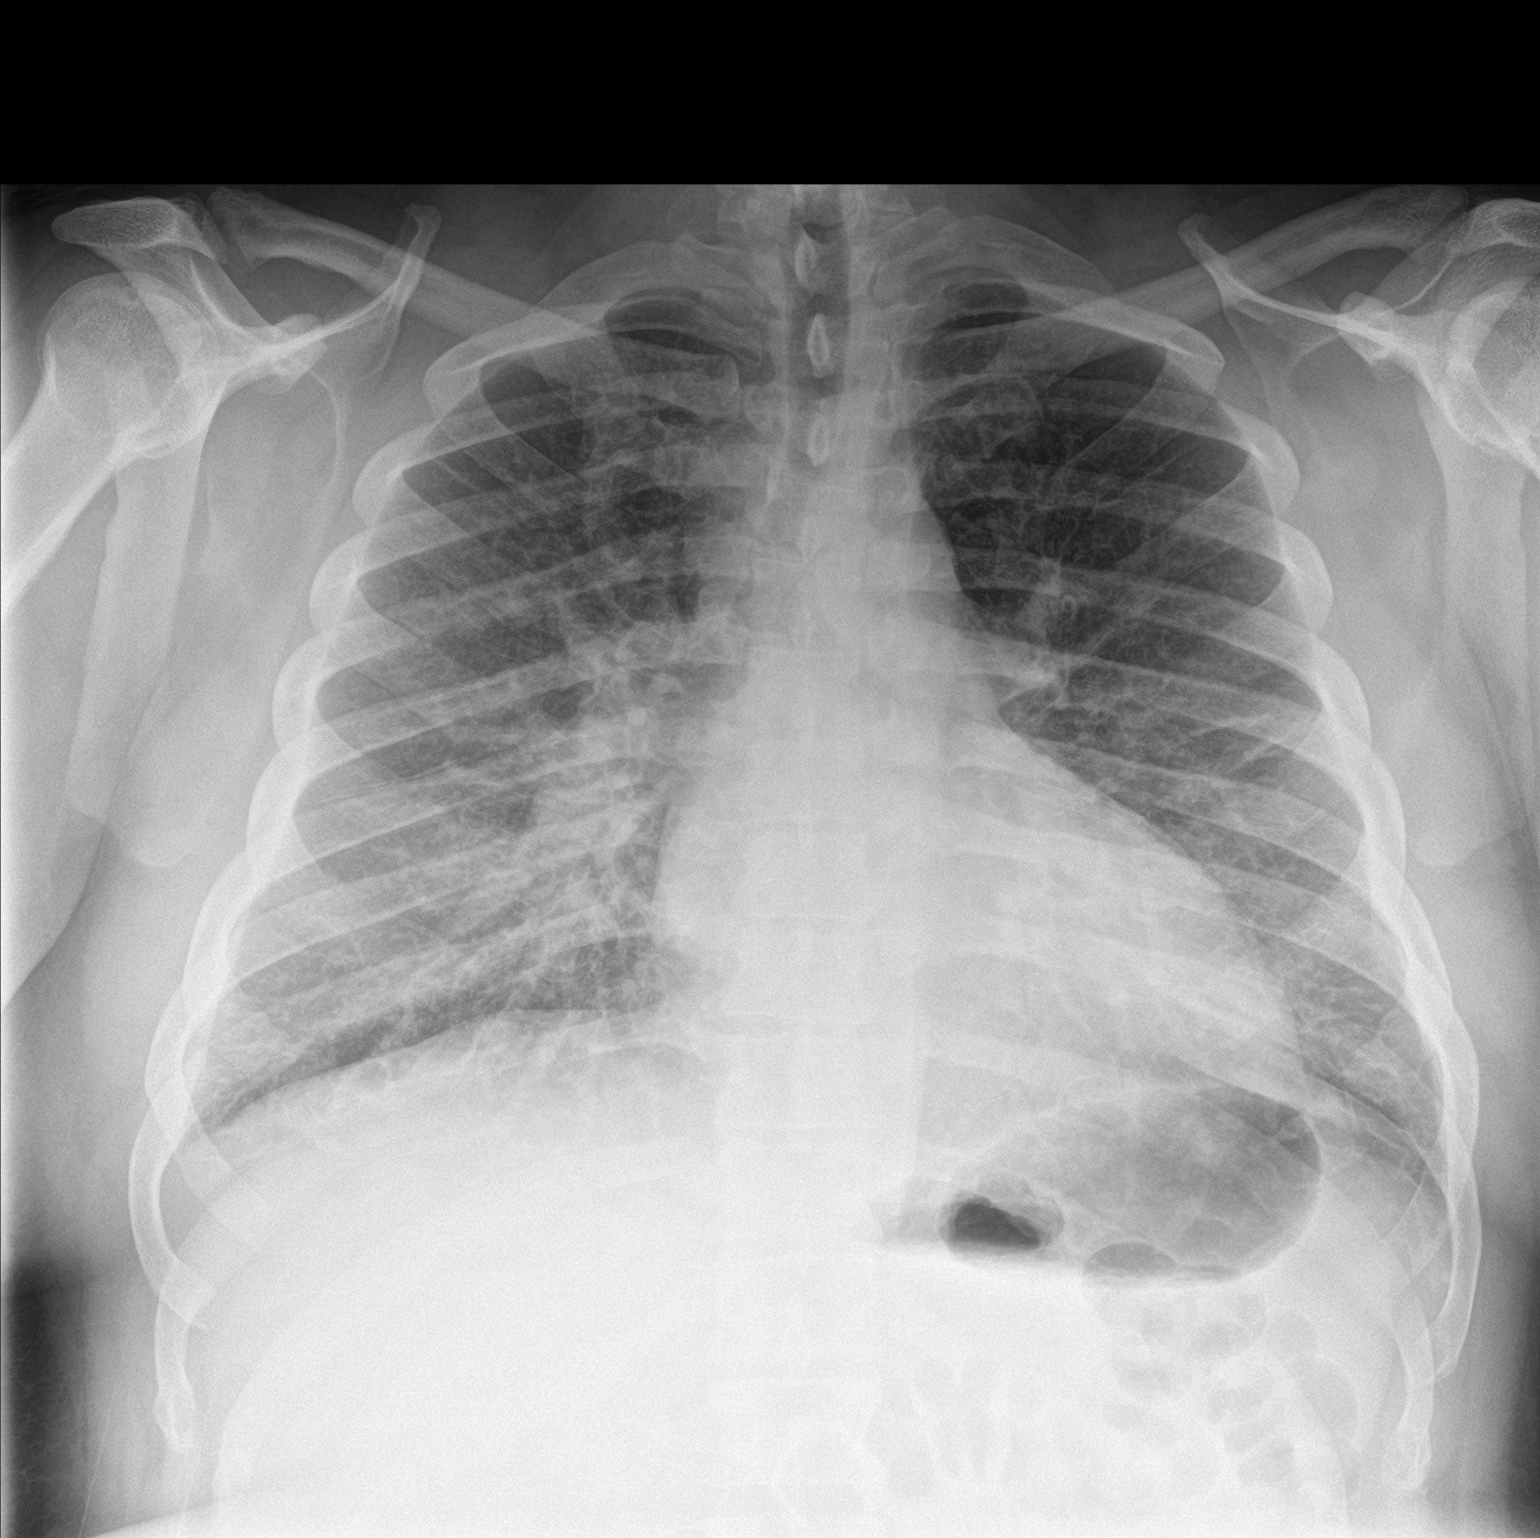

[chest lat]
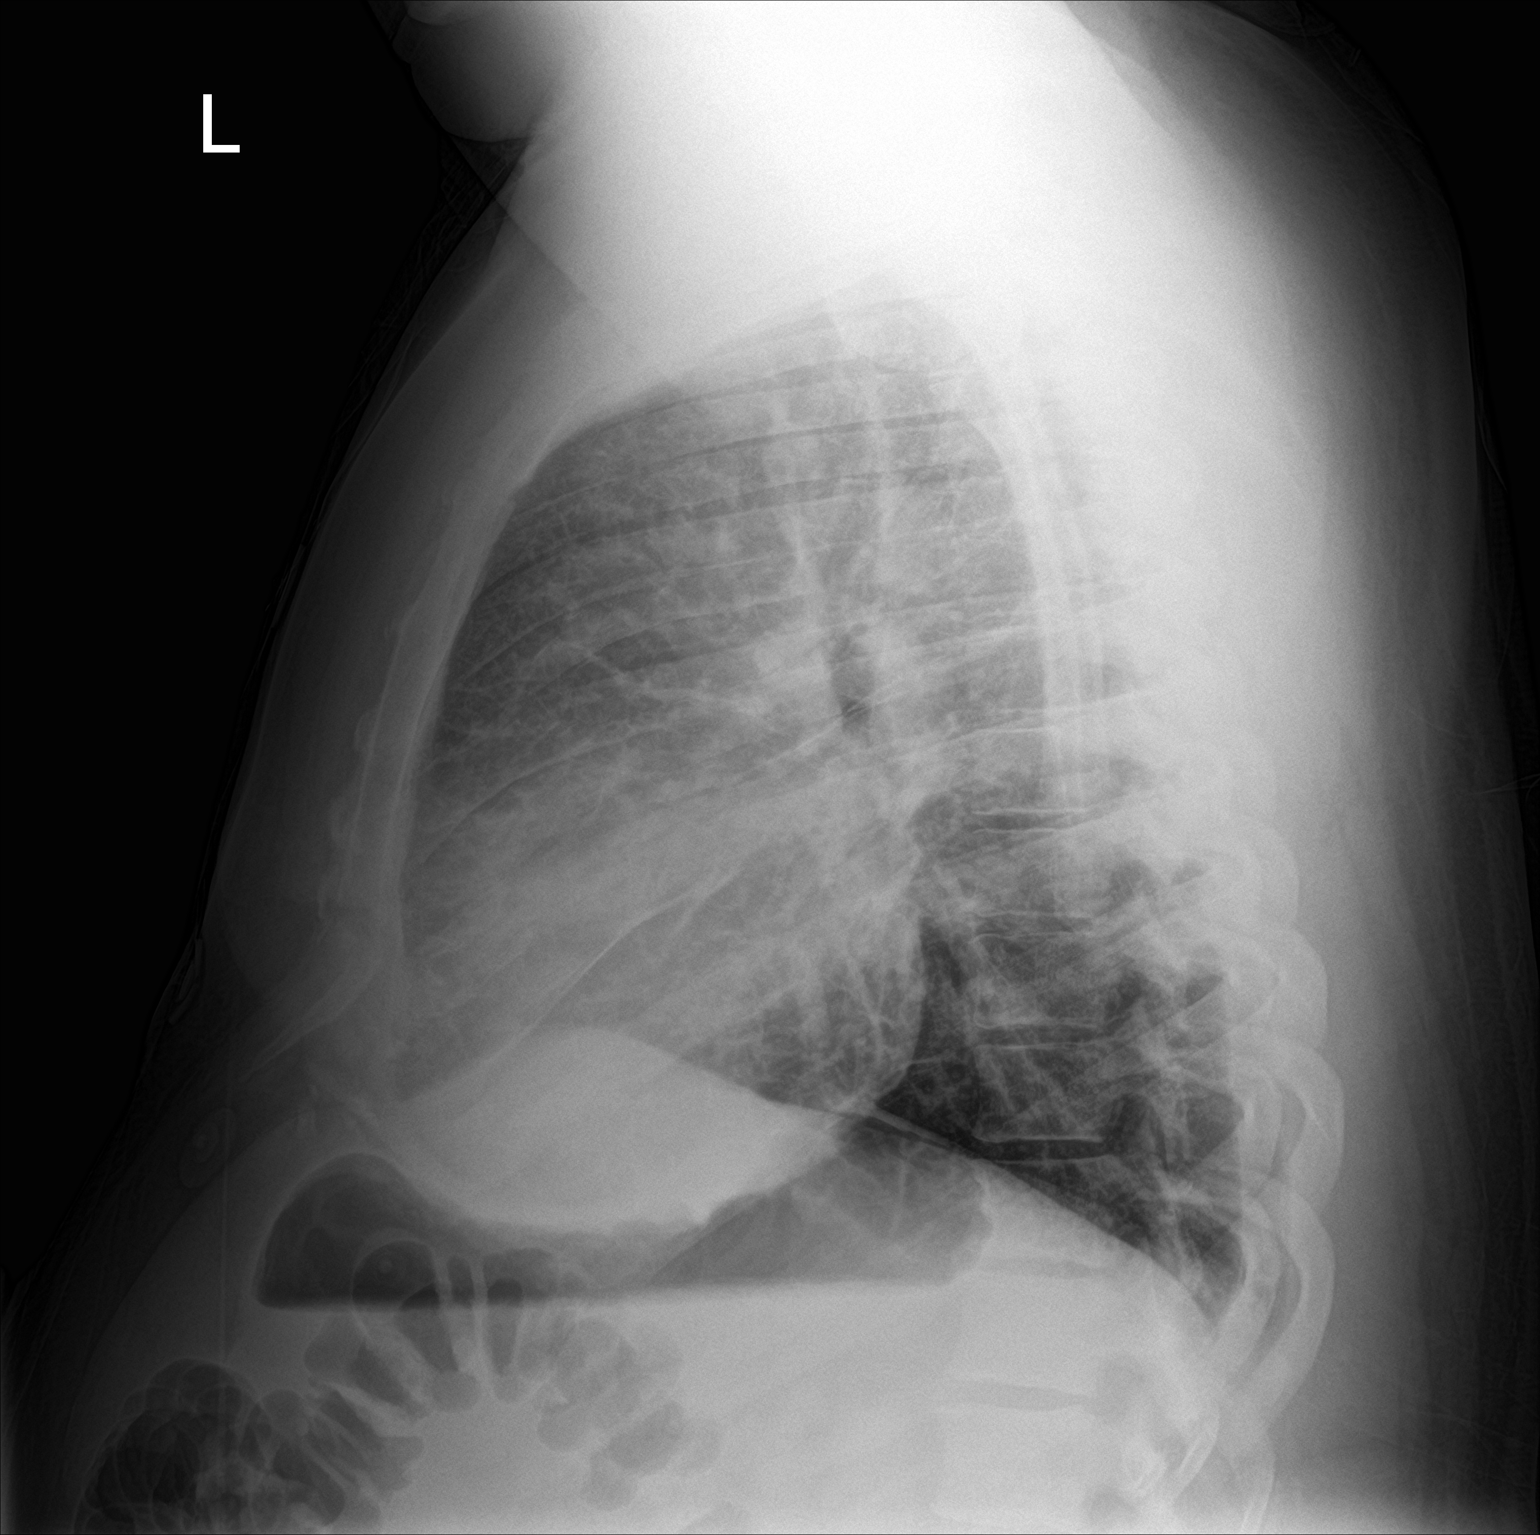

[2 of 2 positions shown; findings below may reference images not displayed]

FINDINGS: The lungs are well-expanded. The interstitial markings are increased
especially in the mid and lower lungs. The heart is top-normal in
size. The pulmonary vascularity is mildly prominent. There is no
pleural effusion. The bony thorax is unremarkable.
IMPRESSION: Probable mild CHF superimposed upon COPD. There is no discrete
pneumonia but early interstitial pneumonia is not excluded.

## 2020-02-18 ENCOUNTER — Ambulatory Visit: Payer: Self-pay

## 2020-02-18 NOTE — Telephone Encounter (Signed)
Pt. Complaining of dry mouth x 1 week. Uses CPAP at night and feels this is causing the problem. Has not started any new medications. No sores in mouth. Drinking a "lot of water." Also has a rash on arms he would like to be seen for. No availability this week. Would like medication called in for dry mouth and an appointment for rash. Please advise pt.  Answer Assessment - Initial Assessment Questions 1. SYMPTOM: "What's the main symptom you're concerned about?" (e.g., dry mouth. chapped lips, lump)     Dry mouth 2. ONSET: "When did the  dryness  start?"     Started 1 week ago 3. PAIN: "Is there any pain?" If Yes, ask: "How bad is it?" (Scale: 1-10; mild, moderate, severe)     Moderate 4. CAUSE: "What do you think is causing the symptoms?"     CPAP 5. OTHER SYMPTOMS: "Do you have any other symptoms?" (e.g., fever, sore throat, toothache, swelling)     No 6. PREGNANCY: "Is there any chance you are pregnant?" "When was your last menstrual period?"     n/a  Protocols used: MOUTH [facility]

## 2020-02-19 NOTE — Telephone Encounter (Signed)
Forwarded to CMA to f/u as appropriate.

## 2020-02-19 NOTE — Telephone Encounter (Signed)
Patient states that he has a rash and is having dry mouth due to CPAP machine.

## 2020-02-20 MED ORDER — BIOTENE DRY MOUTH MOISTURIZING MT SOLN: OROMUCOSAL | 2 refills | Status: DC

## 2020-02-20 MED ORDER — BIOTENE DRY MOUTH MOISTURIZING MT SOLN: OROMUCOSAL | Status: DC

## 2020-02-20 NOTE — Telephone Encounter (Signed)
I have sent a prescription for Biotene to his pharmacy

## 2020-02-20 NOTE — Addendum Note (Signed)
Addended by: Hoy Register on: 02/20/2020 12:02 PM   Modules accepted: Orders

## 2020-02-20 NOTE — Telephone Encounter (Signed)
Patient was called and a message was left informing patient that he has medication at pharmacy.

## 2020-02-21 ENCOUNTER — Other Ambulatory Visit: Payer: Self-pay | Admitting: *Deleted

## 2020-02-21 NOTE — Telephone Encounter (Signed)
  Patient Consent for Virtual Visit         Peter Russo has provided verbal consent on 02/21/2020 for a virtual visit (video or telephone).   CONSENT FOR VIRTUAL VISIT FOR:  Peter Russo  By participating in this virtual visit I agree to the following:  I hereby voluntarily request, consent and authorize [facility] and its employed or contracted physicians, physician assistants, nurse practitioners or other licensed health care professionals (the Practitioner), to provide me with telemedicine health care services (the "Services") as deemed necessary by the treating Practitioner. I acknowledge and consent to receive the Services by the Practitioner via telemedicine. I understand that the telemedicine visit will involve communicating with the Practitioner through live audiovisual communication technology and the disclosure of certain medical information by electronic transmission. I acknowledge that I have been given the opportunity to request an in-person assessment or other available alternative prior to the telemedicine visit and am voluntarily participating in the telemedicine visit.  I understand that I have the right to withhold or withdraw my consent to the use of telemedicine in the course of my care at any time, without affecting my right to future care or treatment, and that the Practitioner or I may terminate the telemedicine visit at any time. I understand that I have the right to inspect all information obtained and/or recorded in the course of the telemedicine visit and may receive copies of available information for a reasonable fee.  I understand that some of the potential risks of receiving the Services via telemedicine include:  Marland Kitchen Delay or interruption in medical evaluation due to technological equipment failure or disruption; . Information transmitted may not be sufficient (e.g. poor resolution of images) to allow for appropriate medical decision making by the  Practitioner; and/or  . In rare instances, security protocols could fail, causing a breach of personal health information.  Furthermore, I acknowledge that it is my responsibility to provide information about my medical history, conditions and care that is complete and accurate to the best of my ability. I acknowledge that Practitioner's advice, recommendations, and/or decision may be based on factors not within their control, such as incomplete or inaccurate data provided by me or distortions of diagnostic images or specimens that may result from electronic transmissions. I understand that the practice of medicine is not an exact science and that Practitioner makes no warranties or guarantees regarding treatment outcomes. I acknowledge that a copy of this consent can be made available to me via my patient portal [facility] MyChart), or I can request a printed copy by calling the office of [facility].    I understand that my insurance will be billed for this visit.   I have read or had this consent read to me. . I understand the contents of this consent, which adequately explains the benefits and risks of the Services being provided via telemedicine.  . I have been provided ample opportunity to ask questions regarding this consent and the Services and have had my questions answered to my satisfaction. . I give my informed consent for the services to be provided through the use of telemedicine in my medical care

## 2020-02-21 NOTE — Telephone Encounter (Signed)
Patient has a 10 week follow up appointment scheduled for 03/24/20. Patient understands he needs to keep this appointment for insurance compliance. Patient was grateful for the call and thanked me.

## 2020-02-26 ENCOUNTER — Other Ambulatory Visit: Payer: Self-pay

## 2020-02-26 ENCOUNTER — Ambulatory Visit (HOSPITAL_BASED_OUTPATIENT_CLINIC_OR_DEPARTMENT_OTHER): Payer: Medicare Other

## 2020-02-26 ENCOUNTER — Encounter (HOSPITAL_COMMUNITY): Payer: Self-pay | Admitting: Emergency Medicine

## 2020-02-26 ENCOUNTER — Encounter: Payer: Self-pay | Admitting: Urology

## 2020-02-26 ENCOUNTER — Ambulatory Visit (HOSPITAL_COMMUNITY)
Admission: EM | Admit: 2020-02-26 | Discharge: 2020-02-26 | Disposition: A | Discharge status: Home / Self Care | Payer: Medicare Other | Attending: Emergency Medicine | Admitting: Emergency Medicine

## 2020-02-26 ENCOUNTER — Ambulatory Visit (INDEPENDENT_AMBULATORY_CARE_PROVIDER_SITE_OTHER): Payer: Medicare Other | Admitting: Urology

## 2020-02-26 VITALS — BP 138/74 | HR 84 | Ht 67.0 in | Wt 264.0 lb

## 2020-02-26 DIAGNOSIS — R739 Hyperglycemia, unspecified: Secondary | ICD-10-CM

## 2020-02-26 DIAGNOSIS — N481 Balanitis: Secondary | ICD-10-CM | POA: Diagnosis not present

## 2020-02-26 DIAGNOSIS — R21 Rash and other nonspecific skin eruption: Secondary | ICD-10-CM | POA: Diagnosis not present

## 2020-02-26 DIAGNOSIS — R682 Dry mouth, unspecified: Secondary | ICD-10-CM | POA: Diagnosis not present

## 2020-02-26 DIAGNOSIS — I255 Ischemic cardiomyopathy: Secondary | ICD-10-CM | POA: Diagnosis not present

## 2020-02-26 DIAGNOSIS — R110 Nausea: Secondary | ICD-10-CM | POA: Diagnosis not present

## 2020-02-26 DIAGNOSIS — I5042 Chronic combined systolic (congestive) and diastolic (congestive) heart failure: Secondary | ICD-10-CM

## 2020-02-26 LAB — CBC
HCT: 43.9 % (ref 39.0–52.0)
Hemoglobin: 15.4 g/dL (ref 13.0–17.0)
MCH: 30.3 pg (ref 26.0–34.0)
MCHC: 35.1 g/dL (ref 30.0–36.0)
MCV: 86.4 fL (ref 80.0–100.0)
Platelets: 185 10*3/uL (ref 150–400)
RBC: 5.08 MIL/uL (ref 4.22–5.81)
RDW: 13.3 % (ref 11.5–15.5)
WBC: 8.8 10*3/uL (ref 4.0–10.5)
nRBC: 0 % (ref 0.0–0.2)

## 2020-02-26 LAB — COMPREHENSIVE METABOLIC PANEL
ALT: 37 U/L (ref 0–44)
AST: 50 U/L — ABNORMAL HIGH (ref 15–41)
Albumin: 3.7 g/dL (ref 3.5–5.0)
Alkaline Phosphatase: 112 U/L (ref 38–126)
Anion gap: 17 — ABNORMAL HIGH (ref 5–15)
BUN: 14 mg/dL (ref 6–20)
CO2: 17 mmol/L — ABNORMAL LOW (ref 22–32)
Calcium: 9.2 mg/dL (ref 8.9–10.3)
Chloride: 100 mmol/L (ref 98–111)
Creatinine, Ser: 1.11 mg/dL (ref 0.61–1.24)
GFR calc Af Amer: >60 mL/min (ref >60)
GFR calc non Af Amer: >60 mL/min (ref >60)
Glucose, Bld: 464 mg/dL — ABNORMAL HIGH (ref 70–99)
Potassium: 5 mmol/L (ref 3.5–5.1)
Sodium: 134 mmol/L — ABNORMAL LOW (ref 135–145)
Total Bilirubin: 1.6 mg/dL — ABNORMAL HIGH (ref 0.3–1.2)
Total Protein: 7.6 g/dL (ref 6.5–8.1)

## 2020-02-26 LAB — POCT URINALYSIS DIP (DEVICE)
Bilirubin Urine: NEGATIVE
Glucose, UA: 500 mg/dL — AB
Ketones, ur: NEGATIVE mg/dL
Leukocytes,Ua: NEGATIVE
Nitrite: NEGATIVE
Protein, ur: 30 mg/dL — AB
Specific Gravity, Urine: <=1.005 (ref 1.005–1.030)
Urobilinogen, UA: 0.2 mg/dL (ref 0.0–1.0)
pH: 5 (ref 5.0–8.0)

## 2020-02-26 LAB — CBG MONITORING, ED: Glucose-Capillary: 464 mg/dL — ABNORMAL HIGH (ref 70–99)

## 2020-02-26 MED ORDER — PERFLUTREN LIPID MICROSPHERE
1.0000 mL | INTRAVENOUS | Status: AC | PRN
  Administered 2020-02-26: 2 mL via INTRAVENOUS

## 2020-02-26 MED ORDER — METFORMIN HCL 500 MG PO TABS
500.0000 mg | ORAL_TABLET | Freq: Two times a day (BID) | ORAL | 0 refills | Status: DC
Start: 2020-02-26 — End: 2020-06-03

## 2020-02-26 MED ORDER — TRIAMCINOLONE ACETONIDE 0.1 % EX CREA
1.0000 | TOPICAL_CREAM | Freq: Two times a day (BID) | CUTANEOUS | 0 refills | Status: DC
Start: 2020-02-26 — End: 2020-10-21

## 2020-02-26 NOTE — ED Triage Notes (Signed)
Pt here for nausea and dry mouth; pt denies hx of DM but st s is "pre diabetic"; pt sts recently started using cpap which made mouth dry but has continued since discontinued

## 2020-02-26 NOTE — Discharge Instructions (Addendum)
Your blood sugar is very high, I suspect your dry mouth is coming from your elevated blood sugar and likely diabetes Blood work pending-I will call if results change our plan Begin Metformin twice daily Drink plenty of fluids Please follow-up with primary care-use contact below to set up appointment within the next week  If you develop increased abdominal pain, chest pain, shortness of breath, dizziness, lightheadedness, vomiting, please follow-up in the emergency room

## 2020-02-26 NOTE — Progress Notes (Signed)
02/27/2020 3:14 PM   Peter Russo 08-12-1976 [id number]  Referring provider: Hoy Register, MD [address] [place],  [place] [address] Chief Complaint  Patient presents with  . Other    HPI: Peter Russo is a 44 y.o. male who presents to discuss a circumcision.  -Recent episode of dryness of foreskin accompanied with pain -Video visit last month and was prescribed Lotrisone -No difficulty retracting foreskin -No urinary problems -Past medical history remarkable for coronary artery disease on Brilinta  PMH: Past Medical History:  Diagnosis Date  . Anxiety   . CAD S/P percutaneous coronary angioplasty    a. s/p BMS-mLAD 2010. b. DES to RCA 2012. c. 04/2015: DES to LCx and PCI to LAD c/b dissection with subsequent overlapping DES to mLAD - r/i for NSTEMI afterwards; d. 08/2015 Ant STEMI in setting of noncompliance->133mLAD ISR (3.0x16 Synergy DES); e. 03/2016 PCI of apical LAD; f. 03/2016 Relook Cath: patent LAD stents, RCA 100p CTO-->Med Rx; g. 10/2016 Ant STEMI: PTCA 100p LAD.  Marland Kitchen Chronic combined systolic and diastolic CHF (congestive heart failure) (HCC)    a. 03/2016 Echo: EF 30-35%, Gr2 DD;  b. 08/2016 Echo: EF 40%.  . Depression   . Essential hypertension   . GAD (generalized anxiety disorder)   . GERD (gastroesophageal reflux disease)   . Hypercholesteremia   . Ischemic cardiomyopathy    a. prior EF 25-35 percent at cath, 35-40 percent by echo; b. 03/2016 Echo: EF 30-35%, diff HK, Gr2 DD; c. 08/2016 Echo: EF 40%, base/mid inferior/inferowseptal AK, mildly dil LA.  . Morbid obesity (HCC)   . Pre-diabetes   . SVT (supraventricular tachycardia) (HCC) APRROX 10 YRS AGO   PRESUMED AVNRT, ECHO 12/07 WITH EF 65%, MILD LAE  . Tobacco abuse    a. 20 + pack years, quit 10/2016.    Surgical History: Past Surgical History:  Procedure Laterality Date  . CARDIAC CATHETERIZATION N/A 04/23/2015   Procedure: Left Heart Cath and Coronary Angiography;  Surgeon: Wendall Stade, MD;  Location: [facility] INVASIVE CV LAB;  Service: Cardiovascular;  Laterality: N/A;  . CARDIAC CATHETERIZATION N/A 04/23/2015   Procedure: Coronary Stent Intervention;  Surgeon: Tonny Bollman, MD;  Location: [facility] INVASIVE CV LAB;  Service: Cardiovascular;  Laterality: N/A;  . CARDIAC CATHETERIZATION N/A 09/14/2015   Procedure: Left Heart Cath and Coronary Angiography;  Surgeon: Lyn Records, MD;  LAD 100%, CFX 10% ISR, RCA 100% (chronic), EF 25-35%  . CARDIAC CATHETERIZATION N/A 09/14/2015   Procedure: Coronary Stent Intervention;  Surgeon: Lyn Records, MD;  Location: [facility] INVASIVE CV LAB;  Service: Cardiovascular;  Laterality: N/A; 3.0 x 16 mm Synergy DES LAD  . CARDIAC CATHETERIZATION  03/16/2016  . CARDIAC CATHETERIZATION N/A 03/16/2016   Procedure: Left Heart Cath and Coronary Angiography;  Surgeon: Lyn Records, MD;  Location: [facility] INVASIVE CV LAB;  Service: Cardiovascular;  Laterality: N/A;  . CARDIAC CATHETERIZATION N/A 03/16/2016   Procedure: Coronary Balloon Angioplasty;  Surgeon: Lyn Records, MD;  Location: [facility] INVASIVE CV LAB;  Service: Cardiovascular;  Laterality: N/A;  . CARDIAC CATHETERIZATION N/A 03/25/2016   Procedure: Left Heart Cath and Coronary Angiography;  Surgeon: Peter M Swaziland, MD;  Location: [facility] INVASIVE CV LAB;  Service: Cardiovascular;  Laterality: N/A;  . CORONARY ANGIOPLASTY WITH STENT PLACEMENT  2010; 2013  . CORONARY BALLOON ANGIOPLASTY N/A 10/19/2016   Procedure: Coronary Balloon Angioplasty;  Surgeon: Corky Crafts, MD;  Location: [facility] INVAS[facility] CV LAB;  Service: Cardiovascular;  Laterality:  N/A;  . INTRAVASCULAR ULTRASOUND/IVUS N/A 10/19/2016   Procedure: Intravascular Ultrasound/IVUS;  Surgeon: Corky Crafts, MD;  Location: [facility] INVASIVE CV LAB;  Service: Cardiovascular;  Laterality: N/A;  . LEFT HEART CATH AND CORONARY ANGIOGRAPHY N/A 10/19/2016   Procedure: Left Heart Cath and Coronary Angiography;  Surgeon: Corky Crafts, MD;  Location: [facility] INVASIVE CV LAB;   Service: Cardiovascular;  Laterality: N/A;    Home Medications:  Allergies as of 02/26/2020   No Known Allergies     Medication List       Accurate as of February 26, 2020  3:14 PM. If you have any questions, ask your nurse or doctor.        acetaminophen 325 MG tablet Commonly known as: TYLENOL Take 2 tablets (650 mg total) by mouth every 4 (four) hours as needed for headache or mild pain.   aspirin 81 MG EC tablet Take 1 tablet (81 mg total) by mouth daily.   atorvastatin 80 MG tablet Commonly known as: LIPITOR TAKE 1 TABLET BY MOUTH EVERY DAY AT 6 PM   Biotene Dry Mouth Moisturizing Soln Use 5 mils 3 times daily as needed   buPROPion 150 MG 24 hr tablet Commonly known as: WELLBUTRIN XL Take 1 tablet (150 mg total) by mouth daily.   carvedilol 12.5 MG tablet Commonly known as: COREG TAKE 1 TABLET (12.5 MG TOTAL) BY MOUTH 2 (TWO) TIMES DAILY.   clotrimazole-betamethasone cream Commonly known as: Lotrisone Apply 1 application topically 2 (two) times daily.   Entresto 49-51 MG Generic drug: sacubitril-valsartan Take 1 tablet by mouth 2 (two) times daily.   furosemide 40 MG tablet Commonly known as: LASIX Take 1 tablet (40 mg total) by mouth daily.   HYDROcodone-acetaminophen 5-325 MG tablet Commonly known as: NORCO/VICODIN Take 1 tablet by mouth every 6 (six) hours as needed for moderate pain or severe pain.   metFORMIN 500 MG tablet Commonly known as: GLUCOPHAGE Take 1 tablet (500 mg total) by mouth 2 (two) times daily with a meal.   nitroGLYCERIN 0.4 MG SL tablet Commonly known as: NITROSTAT PLACE 1 TABLET UNDER TONGUE EVERY 5 MINS, UP TO 3 DOSES AS NEEDED FOR CHEST PAIN   spironolactone 25 MG tablet Commonly known as: ALDACTONE Take 1 tablet (25 mg total) by mouth daily.   ticagrelor 90 MG Tabs tablet Commonly known as: BRILINTA Take 1 tablet (90 mg total) by mouth 2 (two) times daily.   triamcinolone cream 0.1 % Commonly known as: KENALOG Apply 1  application topically 2 (two) times daily.       Allergies: No Known Allergies  Family History: Family History  Problem Relation Age of Onset  . Arrhythmia Mother        MOTHER LIVED TO BE 89  . Coronary artery disease Mother   . Heart attack Mother   . Hypertension Mother   . Coronary artery disease Father 43  . Heart disease Father   . Heart attack Father   . Heart attack Brother   . Stroke Neg Hx     Social History:  reports that he quit smoking about 3 years ago. He has a 24.00 pack-year smoking history. He has never used smokeless tobacco. He reports current drug use. Drug: Marijuana. He reports that he does not drink alcohol.   Physical Exam: BP 138/74   Pulse 84   Ht 5\' 7"  (1.702 m)   Wt 264 lb (119.7 kg)   BMI 41.35 kg/m   Constitutional:  Alert and  oriented, No acute distress. HEENT: [place] AT, moist mucus membranes.  Trachea midline, no masses. Cardiovascular: No clubbing, cyanosis, or edema.  RRR Respiratory: Normal respiratory effort, no increased work of breathing.  Clear GI: Abdomen is soft, nontender, nondistended, no abdominal masses GU: No CVA tenderness. Penis is uncircumcised, foreskin easily retracts, penis without lesions. Testes descended bilaterally without masses or tenderness. Lymph: No cervical or inguinal lymphadenopathy. Skin: No rashes, bruises or suspicious lesions. Neurologic: Grossly intact, no focal deficits, moving all 4 extremities. Psychiatric: Normal mood and affect.    Assessment & Plan:    1. Redundant Prepuce/history of balanitis -Request circumcision -Procedure discussed including potential complications, bleeding, infection, scar tissue formation.  -He will like to schedule some time in August/September -Will need cardiology clearance to stop [facility] [address] [place], [place] [address] [phone]  I, Francina Ames Peace, am acting as a Neurosurgeon for Dr. Lorin Picket C.  [place].  I have reviewed the above documentation for accuracy and completeness, and I agree with the above.   Riki Altes, MD

## 2020-02-26 NOTE — ED Provider Notes (Signed)
[facility]    CSN: [id number] Arrival date & time: 02/26/20  0957      History   Chief Complaint Chief Complaint  Patient presents with  . Nausea    HPI Peter Russo is a 44 y.o. male history of CAD, tobacco use, hypertension, combined CHF, presenting today for evaluation of dry mouth.  Patient reports over the past month he has had significant dry mouth and reports excessive thirst.  He also endorses associated polyuria.  He believes his symptoms were related to using his CPAP, was started on this approximately 1 month ago around the same time his symptoms started.  He stopped using his CPAP over the past 1 to 2 weeks, but symptoms have persisted.  He has tried Biotene artificial saliva which does provide temporary relief, but is short-lived.  He has had associated nausea, denies vomiting, denies abdominal pain.  Denies dizziness or lightheadedness.  Denies chest pain or shortness of breath.  Reports he has poor sleep due to frequent urination.  He also has noticed a change in smell of his breath.   HPI  Past Medical History:  Diagnosis Date  . Anxiety   . CAD S/P percutaneous coronary angioplasty    a. s/p BMS-mLAD 2010. b. DES to RCA 2012. c. 04/2015: DES to LCx and PCI to LAD c/b dissection with subsequent overlapping DES to mLAD - r/i for NSTEMI afterwards; d. 08/2015 Ant STEMI in setting of noncompliance->117mLAD ISR (3.0x16 Synergy DES); e. 03/2016 PCI of apical LAD; f. 03/2016 Relook Cath: patent LAD stents, RCA 100p CTO-->Med Rx; g. 10/2016 Ant STEMI: PTCA 100p LAD.  Marland Kitchen Chronic combined systolic and diastolic CHF (congestive heart failure) (HCC)    a. 03/2016 Echo: EF 30-35%, Gr2 DD;  b. 08/2016 Echo: EF 40%.  . Depression   . Essential hypertension   . GAD (generalized anxiety disorder)   . GERD (gastroesophageal reflux disease)   . Hypercholesteremia   . Ischemic cardiomyopathy    a. prior EF 25-35 percent at cath, 35-40 percent by echo; b. 03/2016 Echo: EF  30-35%, diff HK, Gr2 DD; c. 08/2016 Echo: EF 40%, base/mid inferior/inferowseptal AK, mildly dil LA.  . Morbid obesity (HCC)   . Pre-diabetes   . SVT (supraventricular tachycardia) (HCC) APRROX 10 YRS AGO   PRESUMED AVNRT, ECHO 12/07 WITH EF 65%, MILD LAE  . Tobacco abuse    a. 20 + pack years, quit 10/2016.    Patient Active Problem List   Diagnosis Date Noted  . Uncircumcised male 02/12/2020  . Stable angina (HCC)   . Elevated troponin   . Mixed hyperlipidemia   . Chest pain 11/17/2016  . CAD (coronary artery disease) 11/12/2016  . AKI (acute kidney injury) (HCC) 11/11/2016  . History of acute anterior wall MI 10/19/2016  . Chronic combined systolic and diastolic CHF (congestive heart failure) (HCC)   . Ischemic cardiomyopathy   . H/O medication noncompliance   . Ischemic chest pain (HCC)   . Morbid obesity (HCC)   . Pre-diabetes   . Tobacco abuse   . CAD S/P percutaneous coronary angioplasty   . Essential hypertension   . Cellulitis of hand 05/27/2015  . 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 05/26/2015  . NSTEMI (non-ST elevated myocardial infarction) (HCC) 04/24/2015  . Depression 12/26/2014  . GAD (generalized anxiety disorder) 12/26/2014  . GERD (gastroesophageal reflux disease) 03/01/2013  . Dyslipidemia 03/14/2011    Past Surgical History:  Procedure Laterality Date  . CARDIAC  CATHETERIZATION N/A 04/23/2015   Procedure: Left Heart Cath and Coronary Angiography;  Surgeon: Wendall Stade, MD;  Location: [facility] INVASIVE CV LAB;  Service: Cardiovascular;  Laterality: N/A;  . CARDIAC CATHETERIZATION N/A 04/23/2015   Procedure: Coronary Stent Intervention;  Surgeon: Tonny Bollman, MD;  Location: [facility] INVASIVE CV LAB;  Service: Cardiovascular;  Laterality: N/A;  . CARDIAC CATHETERIZATION N/A 09/14/2015   Procedure: Left Heart Cath and Coronary Angiography;  Surgeon: Lyn Records, MD;  LAD 100%, CFX 10% ISR, RCA 100% (chronic), EF 25-35%  . CARDIAC CATHETERIZATION N/A  09/14/2015   Procedure: Coronary Stent Intervention;  Surgeon: Lyn Records, MD;  Location: [facility] INVASIVE CV LAB;  Service: Cardiovascular;  Laterality: N/A; 3.0 x 16 mm Synergy DES LAD  . CARDIAC CATHETERIZATION  03/16/2016  . CARDIAC CATHETERIZATION N/A 03/16/2016   Procedure: Left Heart Cath and Coronary Angiography;  Surgeon: Lyn Records, MD;  Location: [facility] INVASIVE CV LAB;  Service: Cardiovascular;  Laterality: N/A;  . CARDIAC CATHETERIZATION N/A 03/16/2016   Procedure: Coronary Balloon Angioplasty;  Surgeon: Lyn Records, MD;  Location: [facility] INVASIVE CV LAB;  Service: Cardiovascular;  Laterality: N/A;  . CARDIAC CATHETERIZATION N/A 03/25/2016   Procedure: Left Heart Cath and Coronary Angiography;  Surgeon: Peter M Swaziland, MD;  Location: [facility] INVASIVE CV LAB;  Service: Cardiovascular;  Laterality: N/A;  . CORONARY ANGIOPLASTY WITH STENT PLACEMENT  2010; 2013  . CORONARY BALLOON ANGIOPLASTY N/A 10/19/2016   Procedure: Coronary Balloon Angioplasty;  Surgeon: Corky Crafts, MD;  Location: [facility] INVASIVE CV LAB;  Service: Cardiovascular;  Laterality: N/A;  . INTRAVASCULAR ULTRASOUND/IVUS N/A 10/19/2016   Procedure: Intravascular Ultrasound/IVUS;  Surgeon: Corky Crafts, MD;  Location: [facility] INVASIVE CV LAB;  Service: Cardiovascular;  Laterality: N/A;  . LEFT HEART CATH AND CORONARY ANGIOGRAPHY N/A 10/19/2016   Procedure: Left Heart Cath and Coronary Angiography;  Surgeon: Corky Crafts, MD;  Location: [facility] INVASIVE CV LAB;  Service: Cardiovascular;  Laterality: N/A;       Home Medications    Prior to Admission medications   Medication Sig Start Date End Date Taking? Authorizing Provider  acetaminophen (TYLENOL) 325 MG tablet Take 2 tablets (650 mg total) by mouth every 4 (four) hours as needed for headache or mild pain. 11/13/16   Abelino Derrick, PA-C  Artificial Saliva (BIOTENE DRY MOUTH MOISTURIZING) SOLN Use 5 mils 3 times daily as needed 02/20/20   Hoy Register, MD  aspirin 81 MG EC tablet  Take 1 tablet (81 mg total) by mouth daily. 10/21/16   Marcelino Duster, PA  atorvastatin (LIPITOR) 80 MG tablet TAKE 1 TABLET BY MOUTH EVERY DAY AT 6 PM 12/24/19   Wendall Stade, MD  buPROPion (WELLBUTRIN XL) 150 MG 24 hr tablet Take 1 tablet (150 mg total) by mouth daily. 12/03/19   Wendall Stade, MD  carvedilol (COREG) 12.5 MG tablet TAKE 1 TABLET (12.5 MG TOTAL) BY MOUTH 2 (TWO) TIMES DAILY. 02/03/20 05/03/20  Duke Salvia, MD  clotrimazole-betamethasone (LOTRISONE) cream Apply 1 application topically 2 (two) times daily. 02/12/20   Anders Simmonds, PA-C  furosemide (LASIX) 40 MG tablet Take 1 tablet (40 mg total) by mouth daily. 09/16/19   Wendall Stade, MD  HYDROcodone-acetaminophen (NORCO/VICODIN) 5-325 MG tablet Take 1 tablet by mouth every 6 (six) hours as needed for moderate pain or severe pain. 09/05/19   Mardella Layman, MD  metFORMIN (GLUCOPHAGE) 500 MG tablet Take 1 tablet (500 mg total) by mouth 2 (two)  times daily with a meal. 02/26/20 03/27/20  Jameis Newsham C, PA-C  nitroGLYCERIN (NITROSTAT) 0.4 MG SL tablet PLACE 1 TABLET UNDER TONGUE EVERY 5 MINS, UP TO 3 DOSES AS NEEDED FOR CHEST PAIN 12/24/19   Wendall Stade, MD  sacubitril-valsartan (ENTRESTO) 49-51 MG Take 1 tablet by mouth 2 (two) times daily. 10/30/19   Laurey Morale, MD  spironolactone (ALDACTONE) 25 MG tablet Take 1 tablet (25 mg total) by mouth daily. 11/12/19   Wendall Stade, MD  ticagrelor (BRILINTA) 90 MG TABS tablet Take 1 tablet (90 mg total) by mouth 2 (two) times daily. 07/02/18   Rosalio Macadamia, NP  triamcinolone cream (KENALOG) 0.1 % Apply 1 application topically 2 (two) times daily. 02/26/20   Ethelean Colla, Junius Creamer, PA-C    Family History Family History  Problem Relation Age of Onset  . Arrhythmia Mother        MOTHER LIVED TO BE 102  . Coronary artery disease Mother   . Heart attack Mother   . Hypertension Mother   . Coronary artery disease Father 38  . Heart disease Father   . Heart attack Father     . Heart attack Brother   . Stroke Neg Hx     Social History Social History   Tobacco Use  . Smoking status: Former Smoker    Packs/day: 1.00    Years: 24.00    Pack years: 24.00    Quit date: 11/03/2016    Years since quitting: 3.3  . Smokeless tobacco: Never Used  . Tobacco comment: quit on 03/15/16  Vaping Use  . Vaping Use: Never used  Substance Use Topics  . Alcohol use: No  . Drug use: Yes    Types: Marijuana    Comment: last time 02/2016     Allergies   Patient has no known allergies.   Review of Systems Review of Systems  Constitutional: Negative for fatigue and fever.  HENT: Negative for congestion, sinus pressure and sore throat.   Eyes: Negative for photophobia, pain and visual disturbance.  Respiratory: Negative for cough and shortness of breath.   Cardiovascular: Negative for chest pain.  Gastrointestinal: Positive for nausea. Negative for abdominal pain and vomiting.  Endocrine: Positive for polydipsia and polyuria.  Genitourinary: Negative for decreased urine volume and hematuria.  Musculoskeletal: Negative for myalgias, neck pain and neck stiffness.  Neurological: Negative for dizziness, syncope, facial asymmetry, speech difficulty, weakness, light-headedness, numbness and headaches.     Physical Exam Triage Vital Signs ED Triage Vitals  Enc Vitals Group     BP 02/26/20 1057 130/80     Pulse Rate 02/26/20 1057 80     Resp 02/26/20 1057 18     Temp 02/26/20 1057 98.5 F (36.9 C)     Temp Source 02/26/20 1057 Oral     SpO2 02/26/20 1057 100 %     Weight --      Height --      Head Circumference --      Peak Flow --      Pain Score 02/26/20 1058 0     Pain Loc --      Pain Edu? --      Excl. in GC? --    No data found.  Updated Vital Signs BP 130/80 (BP Location: Right Arm)   Pulse 80   Temp 98.5 F (36.9 C) (Oral)   Resp 18   SpO2 100%   Visual Acuity Right Eye Distance:  Left Eye Distance:   Bilateral Distance:    Right Eye  Near:   Left Eye Near:    Bilateral Near:     Physical Exam Vitals and nursing note reviewed.  Constitutional:      Appearance: He is well-developed.     Comments: No acute distress  HENT:     Head: Normocephalic and atraumatic.     Nose: Nose normal.     Mouth/Throat:     Comments: Tongue appears dry and slightly erythematous, fruity odor noted Eyes:     Conjunctiva/sclera: Conjunctivae normal.  Cardiovascular:     Rate and Rhythm: Normal rate.  Pulmonary:     Effort: Pulmonary effort is normal. No respiratory distress.     Comments: Breathing comfortably at rest, CTABL, no wheezing, rales or other adventitious sounds auscultated  Abdominal:     General: There is no distension.     Comments: Obese, nontender light and deep palpation throughout entire abdomen  Musculoskeletal:        General: Normal range of motion.     Cervical back: Neck supple.  Skin:    General: Skin is warm and dry.     Comments: Bilateral upper extremities with circular lesions with overlying scabbing and excoriation noted bilaterally, no erythema, mild hyperpigmentation  Neurological:     Mental Status: He is alert and oriented to person, place, and time.      UC Treatments / Results  Labs (all labs ordered are listed, but only abnormal results are displayed) Labs Reviewed  CBG MONITORING, ED - Abnormal; Notable for the following components:      Result Value   Glucose-Capillary 464 (*)    All other components within normal limits  POCT URINALYSIS DIP (DEVICE) - Abnormal; Notable for the following components:   Glucose, UA 500 (*)    Hgb urine dipstick TRACE (*)    Protein, ur 30 (*)    All other components within normal limits  CBC  COMPREHENSIVE METABOLIC PANEL  HEMOGLOBIN A1C    EKG   Radiology ECHOCARDIOGRAM COMPLETE  Result Date: 02/26/2020    ECHOCARDIOGRAM REPORT   Patient Name:   Peter Russo Date of Exam: 02/26/2020 Medical Rec #:  [id number]         Height:       67.0  in Accession #:    [id number]        Weight:       274.6 lb Date of Birth:  1976-01-23         BSA:          2.314 m Patient Age:    43 years          BP:           120/78 mmHg Patient Gender: M                 HR:           81 bpm. Exam Location:  [address] Procedure: 2D Echo, Cardiac Doppler, Color Doppler and Intracardiac            Opacification Agent Indications:    I25.5 Ischemic cardiomyopathy; LV function reassessment  History:        Patient has prior history of Echocardiogram examinations, most                 recent 07/30/2018. Previous Myocardial Infarction and CAD,  Signs/Symptoms:Chest Pain; Risk Factors:Hypertension,                 Dyslipidemia and Current Smoker. Prediabetes. Obesity. History                 of medical noncompliance.  Sonographer:    Cathie BeamsAngela Gregory RCS Referring Phys: [phone] Duke SalviaSTEVEN C KLEIN IMPRESSIONS  1. Left ventricular ejection fraction, by estimation, is 25 to 30%. The left ventricle has severely decreased function. The left ventricle has no regional wall motion abnormalities. The left ventricular internal cavity size was moderately dilated. There  is mild left ventricular hypertrophy. Left ventricular diastolic parameters are consistent with Grade I diastolic dysfunction (impaired relaxation).  2. Right ventricular systolic function is normal. The right ventricular size is normal.  3. The mitral valve is normal in structure. Trivial mitral valve regurgitation. No evidence of mitral stenosis.  4. The aortic valve is normal in structure. Aortic valve regurgitation is not visualized. No aortic stenosis is present.  5. The inferior vena cava is normal in size with greater than 50% respiratory variability, suggesting right atrial pressure of 3 mmHg. Comparison(s): No significant change from prior study. Prior images reviewed side by side. FINDINGS  Left Ventricle: Left ventricular ejection fraction, by estimation, is 25 to 30%. The left ventricle has severely  decreased function. The left ventricle has no regional wall motion abnormalities. The left ventricular internal cavity size was moderately dilated. There is mild left ventricular hypertrophy. Left ventricular diastolic parameters are consistent with Grade I diastolic dysfunction (impaired relaxation). Right Ventricle: The right ventricular size is normal. No increase in right ventricular wall thickness. Right ventricular systolic function is normal. Left Atrium: Left atrial size was normal in size. Right Atrium: Right atrial size was normal in size. Pericardium: There is no evidence of pericardial effusion. Mitral Valve: The mitral valve is normal in structure. Normal mobility of the mitral valve leaflets. Trivial mitral valve regurgitation. No evidence of mitral valve stenosis. Tricuspid Valve: The tricuspid valve is normal in structure. Tricuspid valve regurgitation is not demonstrated. No evidence of tricuspid stenosis. Aortic Valve: The aortic valve is normal in structure. Aortic valve regurgitation is not visualized. No aortic stenosis is present. Pulmonic Valve: The pulmonic valve was normal in structure. Pulmonic valve regurgitation is not visualized. No evidence of pulmonic stenosis. Aorta: The aortic root is normal in size and structure. Venous: The inferior vena cava is normal in size with greater than 50% respiratory variability, suggesting right atrial pressure of 3 mmHg. IAS/Shunts: No atrial level shunt detected by color flow Doppler.  LEFT VENTRICLE PLAX 2D LVIDd:         6.50 cm  Diastology LVIDs:         5.30 cm  LV e' lateral:   4.56 cm/s LV PW:         1.30 cm  LV E/e' lateral: 13.0 LV IVS:        0.90 cm  LV e' medial:    3.37 cm/s LVOT diam:     2.25 cm  LV E/e' medial:  17.5 LV SV:         50 LV SV Index:   22 LVOT Area:     3.98 cm  RIGHT VENTRICLE RV S prime:     7.68 cm/s LEFT ATRIUM             Index LA diam:        4.10 cm 1.77 cm/m LA Vol (A2C):   66.8 ml  28.87 ml/m LA Vol (A4C):    59.1 ml 25.54 ml/m LA Biplane Vol: 62.4 ml 26.97 ml/m  AORTIC VALVE LVOT Vmax:   74.40 cm/s LVOT Vmean:  48.800 cm/s LVOT VTI:    0.126 m  AORTA Ao Root diam: 3.00 cm MITRAL VALVE MV Area (PHT): 4.24 cm    SHUNTS MV Decel Time: 179 msec    Systemic VTI:  0.13 m MV E velocity: 59.10 cm/s  Systemic Diam: 2.25 cm MV A velocity: 77.10 cm/s MV E/A ratio:  0.77 Donato Schultz MD Electronically signed by Donato Schultz MD Signature Date/Time: 02/26/2020/1:09:05 PM    Final     Procedures Procedures (including critical care time)  Medications Ordered in UC Medications - No data to display  Initial Impression / Assessment and Plan / UC Course  I have reviewed the triage vital signs and the nursing notes.  Pertinent labs & imaging results that were available during my care of the patient were reviewed by me and considered in my medical decision making (see chart for details).     Blood sugar 464, no ketones in urine, did recently drink a chair Likely elevating sugars further.  Suspect underlying untreated/undiagnosed diabetes.  Initiating on Metformin, obtaining CMP, CBC as well as A1c.  Stressed importance of hydration and discussed lifestyle modifications to help with diabetes.  Follow-up with PCP next week for follow-up.  If any symptoms progressing or worsening or based off lab work results will recommend to follow-up in emergency room for fluids/insulin.  Do not suspect DKA at this time.  Rash secondary complaint, provided triamcinolone as trial to use topically.  Continue to monitor.  Discussed strict return precautions. Patient verbalized understanding and is agreeable with plan.  Final Clinical Impressions(s) / UC Diagnoses   Final diagnoses:  Hyperglycemia  Dry mouth  Rash and nonspecific skin eruption     Discharge Instructions     Your blood sugar is very high, I suspect your dry mouth is coming from your elevated blood sugar and likely diabetes Blood work pending-I will call if  results change our plan Begin Metformin twice daily Drink plenty of fluids Please follow-up with primary care-use contact below to set up appointment within the next week  If you develop increased abdominal pain, chest pain, shortness of breath, dizziness, lightheadedness, vomiting, please follow-up in the emergency room   ED Prescriptions    Medication Sig Dispense Auth. Provider   metFORMIN (GLUCOPHAGE) 500 MG tablet Take 1 tablet (500 mg total) by mouth 2 (two) times daily with a meal. 60 tablet Verdene Creson C, PA-C   triamcinolone cream (KENALOG) 0.1 % Apply 1 application topically 2 (two) times daily. 30 g Emanuela Runnion, [place] C, PA-C     PDMP not reviewed this encounter.   Sharyon Cable [place] C, PA-C 02/26/20 1329

## 2020-02-27 ENCOUNTER — Encounter: Payer: Self-pay | Admitting: Urology

## 2020-02-27 ENCOUNTER — Telehealth: Payer: Self-pay | Admitting: *Deleted

## 2020-02-27 LAB — HEMOGLOBIN A1C
Hgb A1c MFr Bld: 11.3 % — ABNORMAL HIGH (ref 4.8–5.6)
Mean Plasma Glucose: 278 mg/dL

## 2020-02-27 NOTE — Telephone Encounter (Signed)
   [facility] Pre-operative Risk Assessment    HEARTCARE STAFF: - Please ensure there is not already an duplicate clearance open for this procedure. - Under Visit Info/Reason for Call, type in Other and utilize the format Clearance MM/DD/YY or Clearance TBD. Do not use dashes or single digits. - If request is for dental extraction, please clarify the # of teeth to be extracted.  Request for surgical clearance:  1. What type of surgery is being performed?  CIRCUMCISION   2. When is this surgery scheduled?  TBD    3. What type of clearance is required (medical clearance vs. Pharmacy clearance to hold med vs. Both)?  BOTH  4. Are there any medications that need to be held prior to surgery and how long? ASPIRIN / BRILINTA X'S 5 DAYS   5. Practice name and name of physician performing surgery?  [facility] / DR. STOIOFF   6. What is the office phone number?  [phone]   7.   What is the office fax number?  [phone]    8.   Anesthesia type (None, local, MAC, general) ?     Jeanann Lewandowsky 02/27/2020, 4:07 PM  _________________________________________________________________   (provider comments below)

## 2020-02-28 ENCOUNTER — Telehealth: Payer: Self-pay | Admitting: Radiology

## 2020-02-28 NOTE — Telephone Encounter (Signed)
Discussed recommendation by cardiologist for further testing prior to surgery clearance. Per Dr Lonna Cobb, discussed with patient that Dr Lonna Cobb would recommend he may want to reconsider having it done. Patient expresses understanding.

## 2020-02-28 NOTE — Telephone Encounter (Signed)
Difficult situation He has severe ischemic DCM last intervention to LAD no stent placed due to ? Compliance issues Total RCA with collaterals will make myovue abnormal suggest he see Dr Shirlee Latch and arrange right and left heart cath before proceeding with general anesthesia and surgery Dr Graciela Husbands has also seen and need decision on AICD likely after cath

## 2020-02-28 NOTE — Telephone Encounter (Signed)
Called patient he did not want to be seen next week . Pt requested an appt [birth year] . Pt aware this is an urgent referral per Dr.Nishan pt verbalized understanding but still requested I schedule him the first week of August. Pt scheduled 8/6.

## 2020-02-28 NOTE — Telephone Encounter (Signed)
Needs appointment with me asap, can work in next week, to arrange RHC/LHC.

## 2020-02-28 NOTE — Telephone Encounter (Signed)
See pre op note from Ronie Spies, [facility] in regards to Dr. Fabio Bering recommendations pt needs HF Clinic to see Dr. Shirlee Latch and arrange right and left heart cath. I will send notes to HF Clinic Triage to set up appt.

## 2020-02-28 NOTE — Telephone Encounter (Signed)
   Primary Cardiologist:Peter Eden Emms, MD  Chart reviewed as part of pre-operative protocol coverage.   As per Dr. Eden Emms below, because of Peter Russo's past medical history and time since last visit, they will require a follow-up visit in order to better assess preoperative cardiovascular risk. Patient was supposed to see Advanced Heart Failure clinic earlier this year but did not attend appointment. Therefore Dr. Eden Emms suggests he see Dr Shirlee Latch and arrange right and left heart cath before proceeding with general anesthesia and surgery.  Pre-op covering staff: - Please schedule appointment and call patient to inform them - Please contact requesting surgeon's office via preferred method (i.e, phone, fax) to inform them of need for appointment prior to surgery.    Laurann Montana, PA-C  02/28/2020, 10:09 AM

## 2020-02-28 NOTE — Telephone Encounter (Signed)
Pt is scheduled to see Dr. Shirlee Latch 03/20/20 for pre op clearance. I will forward clearance notes to MD for upcoming appt. I will send FYI to requesting office pt has appt with cardiologist 03/20/20. I will remove from the pre op call back pool.

## 2020-03-02 ENCOUNTER — Telehealth (HOSPITAL_COMMUNITY): Payer: Self-pay | Admitting: Emergency Medicine

## 2020-03-02 NOTE — Telephone Encounter (Signed)
Spoke with pt verbalized understanding of results to needed PCP follow up

## 2020-03-02 NOTE — Telephone Encounter (Signed)
Ntoed  

## 2020-03-17 NOTE — Telephone Encounter (Signed)
Per [facility] patient has not been compliant due to a lot of medical issues

## 2020-03-20 ENCOUNTER — Encounter (HOSPITAL_COMMUNITY): Payer: Medicare Other | Admitting: Cardiology

## 2020-03-23 NOTE — Progress Notes (Signed)
Virtual Visit via Telephone Note   This visit type was conducted due to national recommendations for restrictions regarding the COVID-19 Pandemic (e.g. social distancing) in an effort to limit this patient's exposure and mitigate transmission in our community.  Due to his co-morbid illnesses, this patient is at least at moderate risk for complications without adequate follow up.  This format is felt to be most appropriate for this patient at this time.  All issues noted in this document were discussed and addressed.  A limited physical exam was performed with this format.  Please refer to the patient's chart for his consent to telehealth for [facility].     Evaluation Performed:  Follow-up visit  This visit type was conducted due to national recommendations for restrictions regarding the COVID-19 Pandemic (e.g. social distancing).  This format is felt to be most appropriate for this patient at this time.  All issues noted in this document were discussed and addressed.  No physical exam was performed (except for noted visual exam findings with Video Visits).  Please refer to the patient's chart (MyChart message for video visits and phone note for telephone visits) for the patient's consent to telehealth for [facility].  Date:  03/24/2020   ID:  Peter Russo, DOB [birth year], MRN [id number]  Patient Location:  Home  Provider location:   [place]  PCP:  Peter Register, MD  Cardiologist:  Charlton Haws, MD  Sleep Medicine:  Armanda Magic, MD Electrophysiologist:  None   Chief Complaint:  OSA  History of Present Illness:    Peter Russo is a 44 y.o. male who presents via audio/video conferencing for a telehealth visit today.    This is a 44yo male with a hx of ASCAD, HTN, ischemic DCM and chronic CHF who was referred by Dr. Graciela Husbands for evaluation of OSA due to CHF, obesity and snoring.  He was found to have severe OSA with an AHI of 66.5/hr with loud snoring, mild central  sleep apnea (CAI 8/hr) and O2 sats as low as 82%.  He underwent CPAP titration but due to ongoing respiratory events, was changed to BiPAP at 16/12cm H2O.    He is having a lot of problems tolerating the PAP mask.  He feels like he is suffocating with the mask.  He switched to a nasal mask but it is the same problem.  He is a mouth breather.  He says that he has a lot of mouth dryness as well.      The patient does not have symptoms concerning for COVID-19 infection (fever, chills, cough, or new shortness of breath).    Prior CV studies:   The following studies were reviewed today:  Sleep study, PAP titration, PAP compliance download  Past Medical History:  Diagnosis Date  . Anxiety   . CAD S/P percutaneous coronary angioplasty    a. s/p BMS-mLAD 2010. b. DES to RCA 2012. c. 04/2015: DES to LCx and PCI to LAD c/b dissection with subsequent overlapping DES to mLAD - r/i for NSTEMI afterwards; d. 08/2015 Ant STEMI in setting of noncompliance->172mLAD ISR (3.0x16 Synergy DES); e. 03/2016 PCI of apical LAD; f. 03/2016 Relook Cath: patent LAD stents, RCA 100p CTO-->Med Rx; g. 10/2016 Ant STEMI: PTCA 100p LAD.  Marland Kitchen Chronic combined systolic and diastolic CHF (congestive heart failure) (HCC)    a. 03/2016 Echo: EF 30-35%, Gr2 DD;  b. 08/2016 Echo: EF 40%.  . Depression   . Essential hypertension   . GAD (generalized  anxiety disorder)   . GERD (gastroesophageal reflux disease)   . Hypercholesteremia   . Ischemic cardiomyopathy    a. prior EF 25-35 percent at cath, 35-40 percent by echo; b. 03/2016 Echo: EF 30-35%, diff HK, Gr2 DD; c. 08/2016 Echo: EF 40%, base/mid inferior/inferowseptal AK, mildly dil LA.  . Morbid obesity (HCC)   . Pre-diabetes   . SVT (supraventricular tachycardia) (HCC) APRROX 10 YRS AGO   PRESUMED AVNRT, ECHO 12/07 WITH EF 65%, MILD LAE  . Tobacco abuse    a. 20 + pack years, quit 10/2016.   Past Surgical History:  Procedure Laterality Date  . CARDIAC CATHETERIZATION N/A 04/23/2015    Procedure: Left Heart Cath and Coronary Angiography;  Surgeon: Wendall Stade, MD;  Location: [facility] INVASIVE CV LAB;  Service: Cardiovascular;  Laterality: N/A;  . CARDIAC CATHETERIZATION N/A 04/23/2015   Procedure: Coronary Stent Intervention;  Surgeon: Tonny Bollman, MD;  Location: [facility] INVASIVE CV LAB;  Service: Cardiovascular;  Laterality: N/A;  . CARDIAC CATHETERIZATION N/A 09/14/2015   Procedure: Left Heart Cath and Coronary Angiography;  Surgeon: Lyn Records, MD;  LAD 100%, CFX 10% ISR, RCA 100% (chronic), EF 25-35%  . CARDIAC CATHETERIZATION N/A 09/14/2015   Procedure: Coronary Stent Intervention;  Surgeon: Lyn Records, MD;  Location: [facility] INVASIVE CV LAB;  Service: Cardiovascular;  Laterality: N/A; 3.0 x 16 mm Synergy DES LAD  . CARDIAC CATHETERIZATION  03/16/2016  . CARDIAC CATHETERIZATION N/A 03/16/2016   Procedure: Left Heart Cath and Coronary Angiography;  Surgeon: Lyn Records, MD;  Location: [facility] INVASIVE CV LAB;  Service: Cardiovascular;  Laterality: N/A;  . CARDIAC CATHETERIZATION N/A 03/16/2016   Procedure: Coronary Balloon Angioplasty;  Surgeon: Lyn Records, MD;  Location: [facility] INVASIVE CV LAB;  Service: Cardiovascular;  Laterality: N/A;  . CARDIAC CATHETERIZATION N/A 03/25/2016   Procedure: Left Heart Cath and Coronary Angiography;  Surgeon: Peter M Swaziland, MD;  Location: [facility] INVASIVE CV LAB;  Service: Cardiovascular;  Laterality: N/A;  . CORONARY ANGIOPLASTY WITH STENT PLACEMENT  2010; 2013  . CORONARY BALLOON ANGIOPLASTY N/A 10/19/2016   Procedure: Coronary Balloon Angioplasty;  Surgeon: Corky Crafts, MD;  Location: [facility] INVASIVE CV LAB;  Service: Cardiovascular;  Laterality: N/A;  . INTRAVASCULAR ULTRASOUND/IVUS N/A 10/19/2016   Procedure: Intravascular Ultrasound/IVUS;  Surgeon: Corky Crafts, MD;  Location: [facility] INVASIVE CV LAB;  Service: Cardiovascular;  Laterality: N/A;  . LEFT HEART CATH AND CORONARY ANGIOGRAPHY N/A 10/19/2016   Procedure: Left Heart Cath and Coronary  Angiography;  Surgeon: Corky Crafts, MD;  Location: [facility] INVASIVE CV LAB;  Service: Cardiovascular;  Laterality: N/A;     Current Meds  Medication Sig  . acetaminophen (TYLENOL) 325 MG tablet Take 2 tablets (650 mg total) by mouth every 4 (four) hours as needed for headache or mild pain.  . Artificial Saliva (BIOTENE DRY MOUTH MOISTURIZING) SOLN Use 5 mils 3 times daily as needed  . aspirin 81 MG EC tablet Take 1 tablet (81 mg total) by mouth daily.  Marland Kitchen atorvastatin (LIPITOR) 80 MG tablet TAKE 1 TABLET BY MOUTH EVERY DAY AT 6 PM  . buPROPion (WELLBUTRIN XL) 150 MG 24 hr tablet Take 1 tablet (150 mg total) by mouth daily.  . carvedilol (COREG) 12.5 MG tablet TAKE 1 TABLET (12.5 MG TOTAL) BY MOUTH 2 (TWO) TIMES DAILY.  . clotrimazole-betamethasone (LOTRISONE) cream Apply 1 application topically 2 (two) times daily.  . furosemide (LASIX) 40 MG tablet Take 1 tablet (40 mg total) by mouth daily.  Marland Kitchen  HYDROcodone-acetaminophen (NORCO/VICODIN) 5-325 MG tablet Take 1 tablet by mouth every 6 (six) hours as needed for moderate pain or severe pain.  . metFORMIN (GLUCOPHAGE) 500 MG tablet Take 1 tablet (500 mg total) by mouth 2 (two) times daily with a meal.  . nitroGLYCERIN (NITROSTAT) 0.4 MG SL tablet PLACE 1 TABLET UNDER TONGUE EVERY 5 MINS, UP TO 3 DOSES AS NEEDED FOR CHEST PAIN  . sacubitril-valsartan (ENTRESTO) 49-51 MG Take 1 tablet by mouth 2 (two) times daily.  Marland Kitchen. spironolactone (ALDACTONE) 25 MG tablet Take 1 tablet (25 mg total) by mouth daily.  . ticagrelor (BRILINTA) 90 MG TABS tablet Take 1 tablet (90 mg total) by mouth 2 (two) times daily.  Marland Kitchen. triamcinolone cream (KENALOG) 0.1 % Apply 1 application topically 2 (two) times daily.     Allergies:   Patient has no known allergies.   Social History   Tobacco Use  . Smoking status: Former Smoker    Packs/day: 1.00    Years: 24.00    Pack years: 24.00    Quit date: 11/03/2016    Years since quitting: 3.3  . Smokeless tobacco: Never Used    . Tobacco comment: quit on 03/15/16  Vaping Use  . Vaping Use: Never used  Substance Use Topics  . Alcohol use: No  . Drug use: Yes    Types: Marijuana    Comment: last time 02/2016     Family Hx: The patient's family history includes Arrhythmia in his mother; Coronary artery disease in his mother; Coronary artery disease (age of onset: 2154) in his father; Heart attack in his brother, father, and mother; Heart disease in his father; Hypertension in his mother. There is no history of Stroke.  ROS:   Please see the history of present illness.     All other systems reviewed and are negative.   Labs/Other Tests and Data Reviewed:    Recent Labs: 02/26/2020: ALT 37; BUN 14; Creatinine, Ser 1.11; Hemoglobin 15.4; Platelets 185; Potassium 5.0; Sodium 134   Recent Lipid Panel Lab Results  Component Value Date/Time   CHOL 154 08/30/2019 11:32 AM   TRIG 330 (H) 08/30/2019 11:32 AM   HDL 27 (L) 08/30/2019 11:32 AM   CHOLHDL 5.7 (H) 08/30/2019 11:32 AM   CHOLHDL 8.5 10/19/2016 01:12 PM   LDLCALC 74 08/30/2019 11:32 AM    Wt Readings from Last 3 Encounters:  03/24/20 263 lb (119.3 kg)  02/26/20 264 lb (119.7 kg)  02/03/20 274 lb 9.6 oz (124.6 kg)     Objective:    Vital Signs:  Ht 5\' 7"  (1.702 m)   Wt 263 lb (119.3 kg)   BMI 41.19 kg/m     ASSESSMENT & PLAN:    1.  OSA - The pathophysiology of obstructive sleep apnea , it's cardiovascular consequences & modes of treatment including CPAP were discused with the patient in detail & they evidenced understanding.  The patient is having a problems with adjusting to the FFM.   The PAP download was reviewed today and showed an AHI of 7.9/hr on 16/12 cm H2O with 3% compliance in using more than 4 hours nightly.  The patient has been using and benefiting from PAP use and will continue to benefit from therapy.  -he is having a lot of problems with mask intolerance due to claustrophobia so I will refer him to DME for mask  desensitation -encouraged him to be more compliant with his device  2.  HTN -continue spironolactone 25mg  daily, Carvedilol 12.5mg   BID, Entresto 49-51mg  BID  3.  Obesity -I have encouraged him to get into a routine exercise program and cut back on carbs and portions.  -he has already lost 10lbs. -I will refer him to healthy weight and wellness program  COVID-19 Education: The signs and symptoms of COVID-19 were discussed with the patient and how to seek care for testing (follow up with PCP or arrange E-visit).  The importance of social distancing was discussed today.  Patient Risk:   After full review of this patient's clinical status, I feel that they are at least moderate risk at this time.  Time:   Today, I have spent 15 minutes on telemedicine discussing medical problems including OSA< HTN, Obesity and reviewing patient's chart including sleep study, PAP titration and PAP compliance download.  Medication Adjustments/Labs and Tests Ordered: Current medicines are reviewed at length with the patient today.  Concerns regarding medicines are outlined above.  Tests Ordered: No orders of the defined types were placed in this encounter.  Medication Changes: No orders of the defined types were placed in this encounter.   Disposition:  Follow up with me 6 weeks  Signed, Armanda Magic, MD  03/24/2020 8:22 AM     [facility]

## 2020-03-24 ENCOUNTER — Telehealth (INDEPENDENT_AMBULATORY_CARE_PROVIDER_SITE_OTHER): Payer: Medicare Other | Admitting: Cardiology

## 2020-03-24 ENCOUNTER — Telehealth: Payer: Self-pay | Admitting: *Deleted

## 2020-03-24 ENCOUNTER — Other Ambulatory Visit: Payer: Self-pay

## 2020-03-24 VITALS — Ht 67.0 in | Wt 263.0 lb

## 2020-03-24 DIAGNOSIS — I10 Essential (primary) hypertension: Secondary | ICD-10-CM

## 2020-03-24 DIAGNOSIS — G4733 Obstructive sleep apnea (adult) (pediatric): Secondary | ICD-10-CM | POA: Diagnosis not present

## 2020-03-24 DIAGNOSIS — E669 Obesity, unspecified: Secondary | ICD-10-CM

## 2020-03-24 NOTE — Telephone Encounter (Signed)
The dme [facility] does not conduct mask desensitization due to claustraphobia and inability to use mask. Patient has an appointment at the sleep disorders center.

## 2020-03-24 NOTE — Telephone Encounter (Signed)
-----   Message from Quintella Reichert, MD sent at 03/24/2020  8:26 AM EDT ----- Please get patient an appt with DME for mask desensitization due to claustraphobia and inability to use mask

## 2020-03-24 NOTE — Patient Instructions (Signed)
Medication Instructions:  Your physician recommends that you continue on your current medications as directed. Please refer to the Current Medication list given to you today.  *If you need a refill on your cardiac medications before your next appointment, please call your pharmacy*   Lab Work: none If you have labs (blood work) drawn today and your tests are completely normal, you will receive your results only by: Marland Kitchen MyChart Message (if you have MyChart) OR . A paper copy in the mail If you have any lab test that is abnormal or we need to change your treatment, we will call you to review the results.   Testing/Procedures: none   Follow-Up: At [facility], you and your health needs are our priority.  As part of our continuing mission to provide you with exceptional heart care, we have created designated Provider Care Teams.  These Care Teams include your primary Cardiologist (physician) and Advanced Practice Providers (APPs -  Physician Assistants and Nurse Practitioners) who all work together to provide you with the care you need, when you need it.  We recommend signing up for the patient portal called "MyChart".  Sign up information is provided on this After Visit Summary.  MyChart is used to connect with patients for Virtual Visits (Telemedicine).  Patients are able to view lab/test results, encounter notes, upcoming appointments, etc.  Non-urgent messages can be sent to your provider as well.   To learn more about what you can do with MyChart, go to [email or web address].    Your next appointment:   6 week(s)--Scheduled for September 15,2021 at 9:00  The format for your next appointment:   Virtual Visit   Provider:   Armanda Magic, MD   Other Instructions You have been referred to [facility]

## 2020-03-24 NOTE — Addendum Note (Signed)
Addended by: Dossie Arbour on: 03/24/2020 08:57 AM   Modules accepted: Orders

## 2020-03-26 ENCOUNTER — Encounter (HOSPITAL_COMMUNITY): Payer: Medicare Other | Admitting: Cardiology

## 2020-03-26 NOTE — Telephone Encounter (Signed)
Per Kia at sleep center "Peter Russo. I called [name] to schedule his maskfit. He stated that he received a mask from the DME company and wants to try that one first. He said that if it doesn't work or it's uncomfortable, he will call us to schedule the maskfit".

## 2020-04-13 ENCOUNTER — Telehealth: Payer: Self-pay | Admitting: Cardiology

## 2020-04-13 NOTE — Telephone Encounter (Signed)
Would suggest Pfizer vaccine no need for testing before

## 2020-04-13 NOTE — Telephone Encounter (Signed)
New message:     Patient calling to see which covid shot he should take. Patient states that he is not sure which one to take.

## 2020-04-13 NOTE — Telephone Encounter (Signed)
Called patient back and told him that we are encouraging everyone to get vaccinated. Patient is wanting to know which vaccine should he get between [place], [employer], and [employer]. Patient asked if he should get tested for COVID before he gets the vaccine. Informed patient that he should not get tested unless he is having symptoms. Patient would like Dr. Fabio Bering opinion. Will forward to Dr. Eden Emms to see what vaccine he recommends.

## 2020-04-13 NOTE — Telephone Encounter (Signed)
Called patient back to let him know of Dr. Nishan's recommendations. Patient verbalized understanding.  

## 2020-04-28 NOTE — Progress Notes (Signed)
Virtual Visit via Telephone Note   This visit type was conducted due to national recommendations for restrictions regarding the COVID-19 Pandemic (e.g. social distancing) in an effort to limit this patient's exposure and mitigate transmission in our community.  Due to his co-morbid illnesses, this patient is at least at moderate risk for complications without adequate follow up.  This format is felt to be most appropriate for this patient at this time.  All issues noted in this document were discussed and addressed.  A limited physical exam was performed with this format.  Please refer to the patient's chart for his consent to telehealth for [facility].     Evaluation Performed:  Follow-up visit  This visit type was conducted due to national recommendations for restrictions regarding the COVID-19 Pandemic (e.g. social distancing).  This format is felt to be most appropriate for this patient at this time.  All issues noted in this document were discussed and addressed.  No physical exam was performed (except for noted visual exam findings with Video Visits).  Please refer to the patient's chart (MyChart message for video visits and phone note for telephone visits) for the patient's consent to telehealth for [facility].  Date:  04/29/2020   ID:  Peter Russo, DOB 12/07/1975, MRN [id number]  Patient Location:  Home  Provider location:   [place]  PCP:  Hoy Register, MD  Cardiologist:  Charlton Haws, MD  Sleep Medicine:  Armanda Magic, MD Electrophysiologist:  None   Chief Complaint:  OSA  History of Present Illness:     This is a 44yo male with a hx of ASCAD, HTN, ischemic DCM and chronic CHF who was referred by Dr. Graciela Husbands for evaluation of OSA due to CHF, obesity and snoring.  He was found to have severe OSA with an AHI of 66.5/hr with loud snoring, mild central sleep apnea (CAI 8/hr) and O2 sats as low as 82%.  He underwent CPAP titration but due to ongoing respiratory  events, was changed to BiPAP at 16/12cm H2O.    At his last OV he was having a lot of problems tolerating the PAP mask.  He felt like he was suffocating with the mask.  He switched to a nasal mask but it was the same problem.  He is a mouth breather.  I referred him to his DME for a mask desensitization and he is now back for followup.   He tells me that he did the mask desensitization but he was not compliant and his company made him turn his device in.  He says that he does not have a set bedtime and therefore was falling asleep some during the evening when watching TV and then would not be using his PAP.  He had gotten a new mask that worked better but since he had been noncompliant they had to take his device.  He is motivated to get back on CPAP.   The patient does not have symptoms concerning for COVID-19 infection (fever, chills, cough, or new shortness of breath).    Prior CV studies:   The following studies were reviewed today:  PAP download compliance  Past Medical History:  Diagnosis Date   Anxiety    CAD S/P percutaneous coronary angioplasty    a. s/p BMS-mLAD 2010. b. DES to RCA 2012. c. 04/2015: DES to LCx and PCI to LAD c/b dissection with subsequent overlapping DES to mLAD - r/i for NSTEMI afterwards; d. 08/2015 Ant STEMI in setting of noncompliance->162mLAD  ISR (3.0x16 Synergy DES); e. 03/2016 PCI of apical LAD; f. 03/2016 Relook Cath: patent LAD stents, RCA 100p CTO-->Med Rx; g. 10/2016 Ant STEMI: PTCA 100p LAD.   Chronic combined systolic and diastolic CHF (congestive heart failure) (HCC)    a. 03/2016 Echo: EF 30-35%, Gr2 DD;  b. 08/2016 Echo: EF 40%.   Depression    Essential hypertension    GAD (generalized anxiety disorder)    GERD (gastroesophageal reflux disease)    Hypercholesteremia    Ischemic cardiomyopathy    a. prior EF 25-35 percent at cath, 35-40 percent by echo; b. 03/2016 Echo: EF 30-35%, diff HK, Gr2 DD; c. 08/2016 Echo: EF 40%, base/mid  inferior/inferowseptal AK, mildly dil LA.   Morbid obesity (HCC)    Pre-diabetes    SVT (supraventricular tachycardia) (HCC) APRROX 10 YRS AGO   PRESUMED AVNRT, ECHO 12/07 WITH EF 65%, MILD LAE   Tobacco abuse    a. 20 + pack years, quit 10/2016.   Past Surgical History:  Procedure Laterality Date   CARDIAC CATHETERIZATION N/A 04/23/2015   Procedure: Left Heart Cath and Coronary Angiography;  Surgeon: Wendall StadePeter C Nishan, MD;  [facility];  Service: Cardiovascular;  Laterality: N/A;   CARDIAC CATHETERIZATION N/A 04/23/2015   Procedure: Coronary Stent Intervention;  Surgeon: Tonny BollmanMichael Cooper, MD;  [facility];  Service: Cardiovascular;  Laterality: N/A;   CARDIAC CATHETERIZATION N/A 09/14/2015   Procedure: Left Heart Cath and Coronary Angiography;  Surgeon: Lyn RecordsHenry W Smith, MD;  LAD 100%, CFX 10% ISR, RCA 100% (chronic), EF 25-35%   CARDIAC CATHETERIZATION N/A 09/14/2015   Procedure: Coronary Stent Intervention;  Surgeon: Lyn RecordsHenry W Smith, MD;  [facility];  Service: Cardiovascular;  Laterality: N/A; 3.0 x 16 mm Synergy DES LAD   CARDIAC CATHETERIZATION  03/16/2016   CARDIAC CATHETERIZATION N/A 03/16/2016   Procedure: Left Heart Cath and Coronary Angiography;  Surgeon: Lyn RecordsHenry W Smith, MD;  [facility];  Service: Cardiovascular;  Laterality: N/A;   CARDIAC CATHETERIZATION N/A 03/16/2016   Procedure: Coronary Balloon Angioplasty;  Surgeon: Lyn RecordsHenry W Smith, MD;  [facility];  Service: Cardiovascular;  Laterality: N/A;   CARDIAC CATHETERIZATION N/A 03/25/2016   Procedure: Left Heart Cath and Coronary Angiography;  Surgeon: Peter M SwazilandJordan, MD;  [facility];  Service: Cardiovascular;  Laterality: N/A;   CORONARY ANGIOPLASTY WITH STENT PLACEMENT  2010; 2013   CORONARY BALLOON ANGIOPLASTY N/A 10/19/2016   Procedure: Coronary Balloon Angioplasty;  Surgeon: Corky CraftsJayadeep S Varanasi, MD;  [facility];   Service: Cardiovascular;  Laterality: N/A;   INTRAVASCULAR ULTRASOUND/IVUS N/A 10/19/2016   Procedure: Intravascular Ultrasound/IVUS;  Surgeon: Corky CraftsJayadeep S Varanasi, MD;  [facility];  Service: Cardiovascular;  Laterality: N/A;   LEFT HEART CATH AND CORONARY ANGIOGRAPHY N/A 10/19/2016   Procedure: Left Heart Cath and Coronary Angiography;  Surgeon: Corky CraftsJayadeep S Varanasi, MD;  [facility];  Service: Cardiovascular;  Laterality: N/A;     Current Meds  Medication Sig   acetaminophen (TYLENOL) 325 MG tablet Take 2 tablets (650 mg total) by mouth every 4 (four) hours as needed for headache or mild pain.   aspirin 81 MG EC tablet Take 1 tablet (81 mg total) by mouth daily.   atorvastatin (LIPITOR) 80 MG tablet TAKE 1 TABLET BY MOUTH EVERY DAY AT 6 PM   buPROPion (WELLBUTRIN XL) 150 MG 24 hr tablet Take 1 tablet (150 mg  total) by mouth daily.   carvedilol (COREG) 12.5 MG tablet TAKE 1 TABLET (12.5 MG TOTAL) BY MOUTH 2 (TWO) TIMES DAILY.   clotrimazole-betamethasone (LOTRISONE) cream Apply 1 application topically 2 (two) times daily.   furosemide (LASIX) 40 MG tablet Take 1 tablet (40 mg total) by mouth daily.   HYDROcodone-acetaminophen (NORCO/VICODIN) 5-325 MG tablet Take 1 tablet by mouth every 6 (six) hours as needed for moderate pain or severe pain.   metFORMIN (GLUCOPHAGE) 500 MG tablet Take 1 tablet (500 mg total) by mouth 2 (two) times daily with a meal.   nitroGLYCERIN (NITROSTAT) 0.4 MG SL tablet PLACE 1 TABLET UNDER TONGUE EVERY 5 MINS, UP TO 3 DOSES AS NEEDED FOR CHEST PAIN   sacubitril-valsartan (ENTRESTO) 49-51 MG Take 1 tablet by mouth 2 (two) times daily.   spironolactone (ALDACTONE) 25 MG tablet Take 1 tablet (25 mg total) by mouth daily.   ticagrelor (BRILINTA) 90 MG TABS tablet Take 1 tablet (90 mg total) by mouth 2 (two) times daily.   triamcinolone cream (KENALOG) 0.1 % Apply 1 application topically 2 (two) times daily.     Allergies:    Patient has no known allergies.   Social History   Tobacco Use   Smoking status: Former Smoker    Packs/day: 1.00    Years: 24.00    Pack years: 24.00    Quit date: 11/03/2016    Years since quitting: 3.4   Smokeless tobacco: Never Used   Tobacco comment: quit on 03/15/16  Vaping Use   Vaping Use: Never used  Substance Use Topics   Alcohol use: No   Drug use: Yes    Types: Marijuana    Comment: last time 02/2016     Family Hx: The patient's family history includes Arrhythmia in his mother; Coronary artery disease in his mother; Coronary artery disease (age of onset: 74) in his father; Heart attack in his brother, father, and mother; Heart disease in his father; Hypertension in his mother. There is no history of Stroke.  ROS:   Please see the history of present illness.     All other systems reviewed and are negative.   Labs/Other Tests and Data Reviewed:    Recent Labs: 02/26/2020: ALT 37; BUN 14; Creatinine, Ser 1.11; Hemoglobin 15.4; Platelets 185; Potassium 5.0; Sodium 134   Recent Lipid Panel Lab Results  Component Value Date/Time   CHOL 154 08/30/2019 11:32 AM   TRIG 330 (H) 08/30/2019 11:32 AM   HDL 27 (L) 08/30/2019 11:32 AM   CHOLHDL 5.7 (H) 08/30/2019 11:32 AM   CHOLHDL 8.5 10/19/2016 01:12 PM   LDLCALC 74 08/30/2019 11:32 AM    Wt Readings from Last 3 Encounters:  04/29/20 263 lb (119.3 kg)  03/24/20 263 lb (119.3 kg)  02/26/20 264 lb (119.7 kg)     Objective:    Vital Signs:  BP (!) 153/99    Pulse 84    Ht 5\' 7"  (1.702 m)    Wt 263 lb (119.3 kg)    BMI 41.19 kg/m     ASSESSMENT & PLAN:    1.  OSA -  The patient is tolerating PAP therapy well without any problems. The PAP download was reviewed today and showed an AHI of 7.9/hr on 16/12 cm H2O with 3% compliance in using more than 4 hours nightly.  The patient has been using and benefiting from PAP use and will continue to benefit from therapy.  -his insurance company has now made him hand his  device back in due to noncompliance but he is motivated to get back on PAP and thinks that the new mask that he has will work better -I will find out if we need a new HST or if we can order a ResMed BiPAP at 17/13cm H2O  2.  HTN -BP borderline controlled -continue spironolactone 25mg  daily, Carvedilol 12.5mg  BID, Entresto 49-51mg  BID  3.  Obesity  -I referred him to healthy weight and wellness program  COVID-19 Education: The signs and symptoms of COVID-19 were discussed with the patient and how to seek care for testing (follow up with PCP or arrange E-visit).  The importance of social distancing was discussed today.  Patient Risk:   After full review of this patient's clinical status, I feel that they are at least moderate risk at this time.  Time:   Today, I have spent 20 minutes on telemedicine discussing medical problems including OSA< HTN, Obesity and reviewing patient's chart including sleep study, PAP titration and PAP compliance download.  Medication Adjustments/Labs and Tests Ordered: Current medicines are reviewed at length with the patient today.  Concerns regarding medicines are outlined above.  Tests Ordered: No orders of the defined types were placed in this encounter.  Medication Changes: No orders of the defined types were placed in this encounter.   Disposition:  Follow up with me 8 weeks after getting new device  Signed, , MD  04/29/2020 8:48 AM    [facility]

## 2020-04-29 ENCOUNTER — Encounter: Payer: Self-pay | Admitting: Cardiology

## 2020-04-29 ENCOUNTER — Other Ambulatory Visit: Payer: Self-pay

## 2020-04-29 ENCOUNTER — Telehealth (INDEPENDENT_AMBULATORY_CARE_PROVIDER_SITE_OTHER): Payer: Medicare Other | Admitting: Cardiology

## 2020-04-29 VITALS — BP 153/99 | HR 84 | Ht 67.0 in | Wt 263.0 lb

## 2020-04-29 DIAGNOSIS — G4733 Obstructive sleep apnea (adult) (pediatric): Secondary | ICD-10-CM

## 2020-04-29 DIAGNOSIS — E669 Obesity, unspecified: Secondary | ICD-10-CM | POA: Diagnosis not present

## 2020-04-29 DIAGNOSIS — I10 Essential (primary) hypertension: Secondary | ICD-10-CM | POA: Diagnosis not present

## 2020-04-30 ENCOUNTER — Telehealth: Payer: Self-pay | Admitting: *Deleted

## 2020-04-30 DIAGNOSIS — G4733 Obstructive sleep apnea (adult) (pediatric): Secondary | ICD-10-CM

## 2020-04-30 NOTE — Telephone Encounter (Signed)
Peter Reichert, MD  Reesa Chew, CMA Please order split night sleep study   [name]

## 2020-04-30 NOTE — Telephone Encounter (Signed)
-----   Message from Quintella Reichert, MD sent at 04/29/2020  8:56 AM EDT ----- Please find out if  we need a new HST or if we can order a ResMed BiPAP at 17/13cm H2O since he had to send his back due to noncompliance.  Followup with me 8 weeks after getting new BiPAP

## 2020-04-30 NOTE — Telephone Encounter (Signed)
Per choice he has to have an in lab study.

## 2020-05-04 ENCOUNTER — Telehealth: Payer: Self-pay | Admitting: Cardiovascular Disease

## 2020-05-04 NOTE — Telephone Encounter (Signed)
Patient states he was going to have a procedure, but it was cancelled due to an emergency with the doctor. He would like to know when he can reschedule it.

## 2020-05-04 NOTE — Telephone Encounter (Signed)
Patient was suppose to see Dr. Shirlee Latch in CHF clinic. Patient had 2 appointments and they were both canceled. Will send a message to their office to get patient back on the schedule to see Dr. Shirlee Latch as a new patient as soon as possible.

## 2020-05-04 NOTE — Telephone Encounter (Signed)
Split night sent to precert. 

## 2020-05-04 NOTE — Telephone Encounter (Signed)
Pt has cancelled and no showed for all appts with our office, schedule next available new pt, pt will need to keep this appointment

## 2020-05-08 ENCOUNTER — Telehealth: Payer: Self-pay | Admitting: *Deleted

## 2020-05-08 NOTE — Telephone Encounter (Signed)
Patient is scheduled for lab study on 06/17/20. Patient understands his sleep study will be done at [facility] sleep lab. Patient understands he will receive a sleep packet in a week or so. Patient understands to call if he does not receive the sleep packet in a timely manner. Patient agrees with treatment and thanked me for call.

## 2020-05-08 NOTE — Telephone Encounter (Signed)
Staff message sent to Nina ok to schedule sleep study. No PA is required. 

## 2020-05-08 NOTE — Telephone Encounter (Signed)
Peter Russo, CMA  Reesa Chew, CMA Ok to schedule sleep study. No PA is required.         ----- Message -----  From: Reesa Chew, CMA  Sent: 05/04/2020  4:42 PM EDT  To: [name]  Subject: precert                      Split night

## 2020-05-11 ENCOUNTER — Other Ambulatory Visit: Payer: Self-pay

## 2020-05-11 ENCOUNTER — Other Ambulatory Visit (HOSPITAL_COMMUNITY): Payer: Self-pay

## 2020-05-11 ENCOUNTER — Ambulatory Visit (HOSPITAL_COMMUNITY)
Admission: RE | Admit: 2020-05-11 | Discharge: 2020-05-11 | Disposition: A | Discharge status: Home / Self Care | Payer: Medicare Other | Source: Ambulatory Visit | Attending: Cardiology | Admitting: Cardiology

## 2020-05-11 VITALS — BP 122/68 | HR 80 | Wt 259.2 lb

## 2020-05-11 DIAGNOSIS — I5022 Chronic systolic (congestive) heart failure: Secondary | ICD-10-CM | POA: Insufficient documentation

## 2020-05-11 DIAGNOSIS — Z955 Presence of coronary angioplasty implant and graft: Secondary | ICD-10-CM | POA: Insufficient documentation

## 2020-05-11 DIAGNOSIS — G4733 Obstructive sleep apnea (adult) (pediatric): Secondary | ICD-10-CM | POA: Insufficient documentation

## 2020-05-11 DIAGNOSIS — Z8249 Family history of ischemic heart disease and other diseases of the circulatory system: Secondary | ICD-10-CM | POA: Insufficient documentation

## 2020-05-11 DIAGNOSIS — E119 Type 2 diabetes mellitus without complications: Secondary | ICD-10-CM | POA: Insufficient documentation

## 2020-05-11 DIAGNOSIS — R7303 Prediabetes: Secondary | ICD-10-CM | POA: Diagnosis not present

## 2020-05-11 DIAGNOSIS — I255 Ischemic cardiomyopathy: Secondary | ICD-10-CM | POA: Insufficient documentation

## 2020-05-11 DIAGNOSIS — E785 Hyperlipidemia, unspecified: Secondary | ICD-10-CM | POA: Diagnosis not present

## 2020-05-11 DIAGNOSIS — F17210 Nicotine dependence, cigarettes, uncomplicated: Secondary | ICD-10-CM | POA: Insufficient documentation

## 2020-05-11 DIAGNOSIS — I208 Other forms of angina pectoris: Secondary | ICD-10-CM

## 2020-05-11 DIAGNOSIS — I25110 Atherosclerotic heart disease of native coronary artery with unstable angina pectoris: Secondary | ICD-10-CM | POA: Insufficient documentation

## 2020-05-11 DIAGNOSIS — I5042 Chronic combined systolic (congestive) and diastolic (congestive) heart failure: Secondary | ICD-10-CM

## 2020-05-11 DIAGNOSIS — Z7982 Long term (current) use of aspirin: Secondary | ICD-10-CM | POA: Diagnosis not present

## 2020-05-11 DIAGNOSIS — I110 Hypertensive heart disease with heart failure: Secondary | ICD-10-CM | POA: Insufficient documentation

## 2020-05-11 DIAGNOSIS — Z7984 Long term (current) use of oral hypoglycemic drugs: Secondary | ICD-10-CM | POA: Insufficient documentation

## 2020-05-11 DIAGNOSIS — E782 Mixed hyperlipidemia: Secondary | ICD-10-CM | POA: Diagnosis not present

## 2020-05-11 DIAGNOSIS — I252 Old myocardial infarction: Secondary | ICD-10-CM | POA: Insufficient documentation

## 2020-05-11 DIAGNOSIS — Z79899 Other long term (current) drug therapy: Secondary | ICD-10-CM | POA: Diagnosis not present

## 2020-05-11 DIAGNOSIS — I2510 Atherosclerotic heart disease of native coronary artery without angina pectoris: Secondary | ICD-10-CM

## 2020-05-11 LAB — LIPID PANEL
Cholesterol: 286 mg/dL — ABNORMAL HIGH (ref 0–200)
HDL: 39 mg/dL — ABNORMAL LOW (ref >40)
LDL Cholesterol: 175 mg/dL — ABNORMAL HIGH (ref 0–99)
Total CHOL/HDL Ratio: 7.3 RATIO
Triglycerides: 362 mg/dL — ABNORMAL HIGH (ref <150)
VLDL: 72 mg/dL — ABNORMAL HIGH (ref 0–40)

## 2020-05-11 LAB — CBC
HCT: 46.4 % (ref 39.0–52.0)
Hemoglobin: 15.7 g/dL (ref 13.0–17.0)
MCH: 29.7 pg (ref 26.0–34.0)
MCHC: 33.8 g/dL (ref 30.0–36.0)
MCV: 87.7 fL (ref 80.0–100.0)
Platelets: 170 10*3/uL (ref 150–400)
RBC: 5.29 MIL/uL (ref 4.22–5.81)
RDW: 12.4 % (ref 11.5–15.5)
WBC: 8.8 10*3/uL (ref 4.0–10.5)
nRBC: 0 % (ref 0.0–0.2)

## 2020-05-11 LAB — COMPREHENSIVE METABOLIC PANEL
ALT: 32 U/L (ref 0–44)
AST: 21 U/L (ref 15–41)
Albumin: 3.7 g/dL (ref 3.5–5.0)
Alkaline Phosphatase: 85 U/L (ref 38–126)
Anion gap: 10 (ref 5–15)
BUN: 11 mg/dL (ref 6–20)
CO2: 24 mmol/L (ref 22–32)
Calcium: 9.6 mg/dL (ref 8.9–10.3)
Chloride: 103 mmol/L (ref 98–111)
Creatinine, Ser: 1.21 mg/dL (ref 0.61–1.24)
GFR calc Af Amer: >60 mL/min (ref >60)
GFR calc non Af Amer: >60 mL/min (ref >60)
Glucose, Bld: 328 mg/dL — ABNORMAL HIGH (ref 70–99)
Potassium: 4.3 mmol/L (ref 3.5–5.1)
Sodium: 137 mmol/L (ref 135–145)
Total Bilirubin: 0.8 mg/dL (ref 0.3–1.2)
Total Protein: 7.6 g/dL (ref 6.5–8.1)

## 2020-05-11 MED ORDER — ENTRESTO 97-103 MG PO TABS: 1.0000 | ORAL_TABLET | Freq: Two times a day (BID) | ORAL | 3 refills | Status: DC

## 2020-05-11 NOTE — Patient Instructions (Signed)
INCREASE Entresto 97/103mg  twice daily  Labs done today, your results will be available in MyChart, we will contact you for abnormal readings.  You have been referred to Endocrinology with Dr. Gayla Medicus. They will contact you to schedule.  Please follow up with our office in 4-6 weeks.   If you have any questions or concerns before your next appointment please send [place] a message through [place] or call our office at [phone].    TO LEAVE A MESSAGE FOR THE NURSE SELECT OPTION 2, PLEASE LEAVE A MESSAGE INCLUDING: . YOUR NAME . DATE OF BIRTH . CALL BACK NUMBER . REASON FOR CALL**this is important as we prioritize the call backs  YOU WILL RECEIVE A CALL BACK THE SAME DAY AS LONG AS YOU CALL BEFORE 4:00 PM  At the [facility], you and your health needs are our priority. As part of our continuing mission to provide you with exceptional heart care, we have created designated Provider Care Teams. These Care Teams include your primary Cardiologist (physician) and Advanced Practice Providers (APPs- Physician Assistants and Nurse Practitioners) who all work together to provide you with the care you need, when you need it.   You may see any of the following providers on your designated Care Team at your next follow up: Marland Kitchen Dr Arvilla Meres . Dr Marca Ancona . Tonye Becket, NP . Robbie Lis, PA . Karle Plumber, PharmD   Please be sure to bring in all your medications bottles to every appointment.   CATHETERIZATION INSTRUCTIONS  You are scheduled for a Cardiac Catheterization on Thursday, October 7 with Dr. Marca Ancona.  1. Please arrive at the [facility] (Main Entrance A) at [facility]: [address] [place], [place] [address] at 5:30 AM (This time is two hours before your procedure to ensure your preparation). Free valet parking service is available.   Special note: Every effort is made to have your procedure done on time. Please understand that  emergencies sometimes delay scheduled procedures.  2. Diet: Do not eat solid foods after midnight.  The patient may have clear liquids until 5am upon the day of the procedure.  3. COVID TEST: 10/5 9:45 AM   [address], [place] [address]  4. Medication instructions in preparation for your procedure:   DO NOT TAKE METFORMIN 10/7- 10/10. CAN RESTART 10/11AM  DO NOT TAKE LASIX 10/7 AM  On the morning of your procedure, take your Aspirin and any morning medicines NOT listed above.  You may use sips of water.  5. Plan for one night stay--bring personal belongings. 6. Bring a current list of your medications and current insurance cards. 7. You MUST have a responsible person to drive you home. 8. Someone MUST be with you the first 24 hours after you arrive home or your discharge will be delayed. 9. Please wear clothes that are easy to get on and off and wear slip-on shoes.  Thank you for allowing [place] to care for you!   -- [facility]

## 2020-05-12 ENCOUNTER — Telehealth (HOSPITAL_COMMUNITY): Payer: Self-pay

## 2020-05-12 NOTE — Progress Notes (Signed)
PCP: Hoy Register, MD Cardiology: Dr. Eden Emms HF Cardiology: Dr. Shirlee Latch  44 y.o. with history of CAD s/p multiple MIs and PCIs, ischemic cardiomyopathy, HTN, active smoking, and diabetes.  Patient has had multiple coronary interventions, fist in 2010.  His RCA is now chronically occluded, and he has had many stents in the LAD.  Echo in 1/18 showed EF 40%.  EF was down to 20-25% in 12/19.  Echo in 7/21 was reported as EF 25-30%, moderate LV enlargement, normal RV.  Of note, patient seen in urgent care in 7/21 and noted to have blood glucose > 400.  HgbA1c was 11.3.  He was started on metformin at urgent care, but has not seen his PCP since that time. Blood glucose today was > 300.    Patient needs clearance for a circumcision surgery.  He is still smoking 1-2 cigarettes/day.  He has chest pain walking up stairs at times and also during sex.  Recently, he has been having 3-4 episodes of exertional chest tightness/week.  Episodes resolve with NTG. Chest pain has been more frequent in the last few weeks.  He is short of breath also walking up stairs or with heavy exertion.  No dyspnea walking on flat ground.  No orthopnea/PND.  No lightheadedness.  He does not drink ETOH or use drugs.  He is on disability for cardiac issues.   ECG (personally reviewed): NSR, LVH  Labs (7/21): K 5, creatinine 1.11, glucose 464, hgbA1 11.3  PMH: 1. CAD:  - BMS to LAD in 2010.  - DES to RCA on 2012 - DES to LCx and LAD in 9/16, complicated by dissection with overlapping DES to mid LAD.  - Anterior STEMI in 1/17 with DES to totally occluded LAD (stent thrombosis).  - 8/17 PCI to apical LAD, RCA noted to be totally occluded with collaterals.  - 3/18 anterior STEMI with PTCA to totally occluded LAD.  2. Chronic systolic CHF: Ischemic cardiomyopathy.   - Echo (1/18): EF 40%.  - Echo (12/19): EF 20-25% - Echo (7/21): EF 25-30%, moderate LV enlargement, mild LVH, RV normal.  3. HTN 4. H/o AVNRT 5. Smoking: Active  smoker, 1-2 cigs/day.  6. OSA: Uses CPAP 7. Type 2 diabetes: Diagnosed in 7/21  Social History   Socioeconomic History  . Marital status: Significant Other    Spouse name: Not on file  . Number of children: Not on file  . Years of education: Not on file  . Highest education level: Not on file  Occupational History  . Occupation: Agricultural engineer: [employer]  Tobacco Use  . Smoking status: Former Smoker    Packs/day: 1.00    Years: 24.00    Pack years: 24.00    Quit date: 11/03/2016    Years since quitting: 3.5  . Smokeless tobacco: Never Used  . Tobacco comment: quit on 03/15/16  Vaping Use  . Vaping Use: Never used  Substance and Sexual Activity  . Alcohol use: No  . Drug use: Yes    Types: Marijuana    Comment: last time 02/2016  . Sexual activity: Yes  Other Topics Concern  . Not on file  Social History Narrative   Lives in [place] by himself.  Sometimes stays with girlfriend in [place].  Does not routinely exercise.   Social Determinants of Health   Financial Resource Strain:   . Difficulty of Paying Living Expenses: Not on file  Food Insecurity:   . Worried About Programme researcher, broadcasting/film/video  in the Last Year: Not on file  . Ran Out of Food in the Last Year: Not on file  Transportation Needs:   . Lack of Transportation (Medical): Not on file  . Lack of Transportation (Non-Medical): Not on file  Physical Activity:   . Days of Exercise per Week: Not on file  . Minutes of Exercise per Session: Not on file  Stress:   . Feeling of Stress : Not on file  Social Connections:   . Frequency of Communication with Friends and Family: Not on file  . Frequency of Social Gatherings with Friends and Family: Not on file  . Attends Religious Services: Not on file  . Active Member of Clubs or Organizations: Not on file  . Attends Banker Meetings: Not on file  . Marital Status: Not on file  Intimate Partner Violence:   . Fear of Current or Ex-Partner: Not on file   . Emotionally Abused: Not on file  . Physically Abused: Not on file  . Sexually Abused: Not on file   Family History  Problem Relation Age of Onset  . Arrhythmia Mother        MOTHER LIVED TO BE 89  . Coronary artery disease Mother   . Heart attack Mother   . Hypertension Mother   . Coronary artery disease Father 81  . Heart disease Father   . Heart attack Father   . Heart attack Brother   . Stroke Neg Hx    ROS: All systems reviewed and negative except as per HPI.   Current Outpatient Medications  Medication Sig Dispense Refill  . acetaminophen (TYLENOL) 325 MG tablet Take 2 tablets (650 mg total) by mouth every 4 (four) hours as needed for headache or mild pain.    Marland Kitchen aspirin 81 MG EC tablet Take 1 tablet (81 mg total) by mouth daily. 90 tablet 3  . atorvastatin (LIPITOR) 80 MG tablet TAKE 1 TABLET BY MOUTH EVERY DAY AT 6 PM 90 tablet 1  . buPROPion (WELLBUTRIN XL) 150 MG 24 hr tablet Take 1 tablet (150 mg total) by mouth daily. 90 tablet 3  . carvedilol (COREG) 12.5 MG tablet TAKE 1 TABLET (12.5 MG TOTAL) BY MOUTH 2 (TWO) TIMES DAILY. 180 tablet 1  . clotrimazole-betamethasone (LOTRISONE) cream Apply 1 application topically 2 (two) times daily. 30 g 0  . furosemide (LASIX) 40 MG tablet Take 1 tablet (40 mg total) by mouth daily. 90 tablet 3  . metFORMIN (GLUCOPHAGE) 500 MG tablet Take 1 tablet (500 mg total) by mouth 2 (two) times daily with a meal. 60 tablet 0  . nitroGLYCERIN (NITROSTAT) 0.4 MG SL tablet PLACE 1 TABLET UNDER TONGUE EVERY 5 MINS, UP TO 3 DOSES AS NEEDED FOR CHEST PAIN 25 tablet 2  . spironolactone (ALDACTONE) 25 MG tablet Take 1 tablet (25 mg total) by mouth daily. 90 tablet 3  . ticagrelor (BRILINTA) 90 MG TABS tablet Take 1 tablet (90 mg total) by mouth 2 (two) times daily. 180 tablet 3  . triamcinolone cream (KENALOG) 0.1 % Apply 1 application topically 2 (two) times daily. 30 g 0  . sacubitril-valsartan (ENTRESTO) 97-103 MG Take 1 tablet by mouth 2 (two)  times daily. 60 tablet 3   No current facility-administered medications for this encounter.   BP 122/68   Pulse 80   Wt 117.6 kg (259 lb 4 oz)   SpO2 98%   BMI 40.60 kg/m  General: NAD Neck: No JVD, no thyromegaly or thyroid  nodule.  Lungs: Clear to auscultation bilaterally with normal respiratory effort. CV: Nondisplaced PMI.  Heart regular S1/S2, no S3/S4, no murmur.  No peripheral edema.  No carotid bruit.  Normal pedal pulses.  Abdomen: Soft, nontender, no hepatosplenomegaly, no distention.  Skin: Intact without lesions or rashes.  Neurologic: Alert and oriented x 3.  Psych: Normal affect. Extremities: No clubbing or cyanosis.  HEENT: Normal.   Assessment/Plan: 1. CAD: RCA known to be chronically occluded.  He has had multiple PCIs to LAD.  Now having more frequent typical angina, resolves with NTG.  - Given progressive angina, think that he merits coronary angiography. If he has severe stenosis in LAD, given multiple previous stents, would consider CABG (also diabetic with CHF).  We discussed risks/benefits and he agrees to go forward with procedure.  - ASA 81 daily.  - Continue atorvastatin 80 mg daily, lipids today.  2. Chronic systolic CHF: Ischemic cardiomyopathy.  Last echo in 7/21 with EF 25-30%.  NYHA class II symptoms.  He is not volume overloaded on exam.  - Increase Entresto to 97/103 bid. BMET today and in 10 days.  - Continue Coreg 12.5 mg bid.  - Continue spironolactone 25 mg daily.  - Next step will be [place].  - I will arrange for RHC with [facility] in order to assess filling pressures and cardiac output. Discussed risks/benefits as above, and he agrees to procedure.  - I think he is going to need an ICD.  He is not a CRT candidate.  Will discuss with him after catheterization. He has already seen Dr. Graciela Husbands but did not decide on ICD.  3. Type 2 diabetes: New diagnosis in 7/21 with hgbA1c 11.3.  He is on metformin but has not seen his PCP since diagnosis was made.  -  Check hgbA1c again today.  - If glucose is not out of control, will need to start [place] eventually for CHF.  - I will refer for evaluation by endocrinology.  4. HTN: BP is controlled.  5. OSA: Use CPAP.  6. Smoking: 1-2 cigs/day.  I urged him to quit, he will try nicotine gum. He is on Wellbutrin.   Marca Ancona 05/12/2020

## 2020-05-12 NOTE — Telephone Encounter (Signed)
Pt aware of results. Pt states he has not been taking medication every day. Advised importance of being consistent with medications. Pt reports he will start to take medications daily. Advised if numbers do not improve despite taking every day, will need referral to lipid clinic. Pt verbalized understanding.

## 2020-05-12 NOTE — H&P (View-Only) (Signed)
PCP: Hoy Register, MD Cardiology: Dr. Eden Emms HF Cardiology: Dr. Shirlee Latch  44 y.o. with history of CAD s/p multiple MIs and PCIs, ischemic cardiomyopathy, HTN, active smoking, and diabetes.  Patient has had multiple coronary interventions, fist in 2010.  His RCA is now chronically occluded, and he has had many stents in the LAD.  Echo in 1/18 showed EF 40%.  EF was down to 20-25% in 12/19.  Echo in 7/21 was reported as EF 25-30%, moderate LV enlargement, normal RV.  Of note, patient seen in urgent care in 7/21 and noted to have blood glucose > 400.  HgbA1c was 11.3.  He was started on metformin at urgent care, but has not seen his PCP since that time. Blood glucose today was > 300.    Patient needs clearance for a circumcision surgery.  He is still smoking 1-2 cigarettes/day.  He has chest pain walking up stairs at times and also during sex.  Recently, he has been having 3-4 episodes of exertional chest tightness/week.  Episodes resolve with NTG. Chest pain has been more frequent in the last few weeks.  He is short of breath also walking up stairs or with heavy exertion.  No dyspnea walking on flat ground.  No orthopnea/PND.  No lightheadedness.  He does not drink ETOH or use drugs.  He is on disability for cardiac issues.   ECG (personally reviewed): NSR, LVH  Labs (7/21): K 5, creatinine 1.11, glucose 464, hgbA1 11.3  PMH: 1. CAD:  - BMS to LAD in 2010.  - DES to RCA on 2012 - DES to LCx and LAD in 9/16, complicated by dissection with overlapping DES to mid LAD.  - Anterior STEMI in 1/17 with DES to totally occluded LAD (stent thrombosis).  - 8/17 PCI to apical LAD, RCA noted to be totally occluded with collaterals.  - 3/18 anterior STEMI with PTCA to totally occluded LAD.  2. Chronic systolic CHF: Ischemic cardiomyopathy.   - Echo (1/18): EF 40%.  - Echo (12/19): EF 20-25% - Echo (7/21): EF 25-30%, moderate LV enlargement, mild LVH, RV normal.  3. HTN 4. H/o AVNRT 5. Smoking: Active  smoker, 1-2 cigs/day.  6. OSA: Uses CPAP 7. Type 2 diabetes: Diagnosed in 7/21  Social History   Socioeconomic History  . Marital status: Significant Other    Spouse name: Not on file  . Number of children: Not on file  . Years of education: Not on file  . Highest education level: Not on file  Occupational History  . Occupation: Agricultural engineer: [employer]  Tobacco Use  . Smoking status: Former Smoker    Packs/day: 1.00    Years: 24.00    Pack years: 24.00    Quit date: 11/03/2016    Years since quitting: 3.5  . Smokeless tobacco: Never Used  . Tobacco comment: quit on 03/15/16  Vaping Use  . Vaping Use: Never used  Substance and Sexual Activity  . Alcohol use: No  . Drug use: Yes    Types: Marijuana    Comment: last time 02/2016  . Sexual activity: Yes  Other Topics Concern  . Not on file  Social History Narrative   Lives in [place] by himself.  Sometimes stays with girlfriend in [place].  Does not routinely exercise.   Social Determinants of Health   Financial Resource Strain:   . Difficulty of Paying Living Expenses: Not on file  Food Insecurity:   . Worried About Programme researcher, broadcasting/film/video  in the Last Year: Not on file  . Ran Out of Food in the Last Year: Not on file  Transportation Needs:   . Lack of Transportation (Medical): Not on file  . Lack of Transportation (Non-Medical): Not on file  Physical Activity:   . Days of Exercise per Week: Not on file  . Minutes of Exercise per Session: Not on file  Stress:   . Feeling of Stress : Not on file  Social Connections:   . Frequency of Communication with Friends and Family: Not on file  . Frequency of Social Gatherings with Friends and Family: Not on file  . Attends Religious Services: Not on file  . Active Member of Clubs or Organizations: Not on file  . Attends Banker Meetings: Not on file  . Marital Status: Not on file  Intimate Partner Violence:   . Fear of Current or Ex-Partner: Not on file   . Emotionally Abused: Not on file  . Physically Abused: Not on file  . Sexually Abused: Not on file   Family History  Problem Relation Age of Onset  . Arrhythmia Mother        MOTHER LIVED TO BE 102  . Coronary artery disease Mother   . Heart attack Mother   . Hypertension Mother   . Coronary artery disease Father 81  . Heart disease Father   . Heart attack Father   . Heart attack Brother   . Stroke Neg Hx    ROS: All systems reviewed and negative except as per HPI.   Current Outpatient Medications  Medication Sig Dispense Refill  . acetaminophen (TYLENOL) 325 MG tablet Take 2 tablets (650 mg total) by mouth every 4 (four) hours as needed for headache or mild pain.    Marland Kitchen aspirin 81 MG EC tablet Take 1 tablet (81 mg total) by mouth daily. 90 tablet 3  . atorvastatin (LIPITOR) 80 MG tablet TAKE 1 TABLET BY MOUTH EVERY DAY AT 6 PM 90 tablet 1  . buPROPion (WELLBUTRIN XL) 150 MG 24 hr tablet Take 1 tablet (150 mg total) by mouth daily. 90 tablet 3  . carvedilol (COREG) 12.5 MG tablet TAKE 1 TABLET (12.5 MG TOTAL) BY MOUTH 2 (TWO) TIMES DAILY. 180 tablet 1  . clotrimazole-betamethasone (LOTRISONE) cream Apply 1 application topically 2 (two) times daily. 30 g 0  . furosemide (LASIX) 40 MG tablet Take 1 tablet (40 mg total) by mouth daily. 90 tablet 3  . metFORMIN (GLUCOPHAGE) 500 MG tablet Take 1 tablet (500 mg total) by mouth 2 (two) times daily with a meal. 60 tablet 0  . nitroGLYCERIN (NITROSTAT) 0.4 MG SL tablet PLACE 1 TABLET UNDER TONGUE EVERY 5 MINS, UP TO 3 DOSES AS NEEDED FOR CHEST PAIN 25 tablet 2  . spironolactone (ALDACTONE) 25 MG tablet Take 1 tablet (25 mg total) by mouth daily. 90 tablet 3  . ticagrelor (BRILINTA) 90 MG TABS tablet Take 1 tablet (90 mg total) by mouth 2 (two) times daily. 180 tablet 3  . triamcinolone cream (KENALOG) 0.1 % Apply 1 application topically 2 (two) times daily. 30 g 0  . sacubitril-valsartan (ENTRESTO) 97-103 MG Take 1 tablet by mouth 2 (two)  times daily. 60 tablet 3   No current facility-administered medications for this encounter.   BP 122/68   Pulse 80   Wt 117.6 kg (259 lb 4 oz)   SpO2 98%   BMI 40.60 kg/m  General: NAD Neck: No JVD, no thyromegaly or thyroid  nodule.  Lungs: Clear to auscultation bilaterally with normal respiratory effort. CV: Nondisplaced PMI.  Heart regular S1/S2, no S3/S4, no murmur.  No peripheral edema.  No carotid bruit.  Normal pedal pulses.  Abdomen: Soft, nontender, no hepatosplenomegaly, no distention.  Skin: Intact without lesions or rashes.  Neurologic: Alert and oriented x 3.  Psych: Normal affect. Extremities: No clubbing or cyanosis.  HEENT: Normal.   Assessment/Plan: 1. CAD: RCA known to be chronically occluded.  He has had multiple PCIs to LAD.  Now having more frequent typical angina, resolves with NTG.  - Given progressive angina, think that he merits coronary angiography. If he has severe stenosis in LAD, given multiple previous stents, would consider CABG (also diabetic with CHF).  We discussed risks/benefits and he agrees to go forward with procedure.  - ASA 81 daily.  - Continue atorvastatin 80 mg daily, lipids today.  2. Chronic systolic CHF: Ischemic cardiomyopathy.  Last echo in 7/21 with EF 25-30%.  NYHA class II symptoms.  He is not volume overloaded on exam.  - Increase Entresto to 97/103 bid. BMET today and in 10 days.  - Continue Coreg 12.5 mg bid.  - Continue spironolactone 25 mg daily.  - Next step will be [place].  - I will arrange for RHC with [facility] in order to assess filling pressures and cardiac output. Discussed risks/benefits as above, and he agrees to procedure.  - I think he is going to need an ICD.  He is not a CRT candidate.  Will discuss with him after catheterization. He has already seen Dr. Graciela Husbands but did not decide on ICD.  3. Type 2 diabetes: New diagnosis in 7/21 with hgbA1c 11.3.  He is on metformin but has not seen his PCP since diagnosis was made.  -  Check hgbA1c again today.  - If glucose is not out of control, will need to start [place] eventually for CHF.  - I will refer for evaluation by endocrinology.  4. HTN: BP is controlled.  5. OSA: Use CPAP.  6. Smoking: 1-2 cigs/day.  I urged him to quit, he will try nicotine gum. He is on Wellbutrin.   Marca Ancona 05/12/2020

## 2020-05-12 NOTE — Telephone Encounter (Signed)
-----   Message from Laurey Morale, MD sent at 05/11/2020  5:00 PM EDT ----- Glucose is still too high, referred to endocrinology.  LDL is markedly elevated also, need to check with him to make sure he is really taking atorvastatin 80 mg daily every day. If not, needs to start taking it asap.  If he is taking it, he needs referral to lipid clinic for [place].

## 2020-05-19 ENCOUNTER — Other Ambulatory Visit (HOSPITAL_COMMUNITY): Payer: Medicare Other

## 2020-05-19 ENCOUNTER — Other Ambulatory Visit (HOSPITAL_COMMUNITY)
Admission: RE | Admit: 2020-05-19 | Discharge: 2020-05-19 | Disposition: A | Discharge status: Home / Self Care | Payer: Medicare Other | Source: Ambulatory Visit | Attending: Cardiology | Admitting: Cardiology

## 2020-05-19 DIAGNOSIS — Z01812 Encounter for preprocedural laboratory examination: Secondary | ICD-10-CM | POA: Insufficient documentation

## 2020-05-19 DIAGNOSIS — Z20822 Contact with and (suspected) exposure to covid-19: Secondary | ICD-10-CM | POA: Insufficient documentation

## 2020-05-19 LAB — SARS CORONAVIRUS 2 (TAT 6-24 HRS): SARS Coronavirus 2: NEGATIVE

## 2020-05-21 ENCOUNTER — Encounter (HOSPITAL_COMMUNITY)
Admission: AD | Disposition: A | Discharge status: Home / Self Care | Payer: Self-pay | Source: Home / Self Care | Attending: Cardiology

## 2020-05-21 ENCOUNTER — Inpatient Hospital Stay (HOSPITAL_COMMUNITY): Payer: Medicare Other

## 2020-05-21 ENCOUNTER — Inpatient Hospital Stay: Payer: Self-pay

## 2020-05-21 ENCOUNTER — Inpatient Hospital Stay (HOSPITAL_COMMUNITY)
Admission: AD | Admit: 2020-05-21 | Discharge: 2020-05-24 | DRG: 286 | Disposition: A | Discharge status: Home / Self Care | Payer: Medicare Other | Attending: Cardiology | Admitting: Cardiology

## 2020-05-21 ENCOUNTER — Encounter (HOSPITAL_COMMUNITY): Payer: Self-pay | Admitting: Cardiology

## 2020-05-21 DIAGNOSIS — Z7984 Long term (current) use of oral hypoglycemic drugs: Secondary | ICD-10-CM | POA: Diagnosis not present

## 2020-05-21 DIAGNOSIS — E785 Hyperlipidemia, unspecified: Secondary | ICD-10-CM | POA: Diagnosis present

## 2020-05-21 DIAGNOSIS — I5042 Chronic combined systolic (congestive) and diastolic (congestive) heart failure: Secondary | ICD-10-CM

## 2020-05-21 DIAGNOSIS — I509 Heart failure, unspecified: Secondary | ICD-10-CM | POA: Diagnosis present

## 2020-05-21 DIAGNOSIS — Z7189 Other specified counseling: Secondary | ICD-10-CM

## 2020-05-21 DIAGNOSIS — I5043 Acute on chronic combined systolic (congestive) and diastolic (congestive) heart failure: Secondary | ICD-10-CM

## 2020-05-21 DIAGNOSIS — G4733 Obstructive sleep apnea (adult) (pediatric): Secondary | ICD-10-CM | POA: Diagnosis present

## 2020-05-21 DIAGNOSIS — E1165 Type 2 diabetes mellitus with hyperglycemia: Secondary | ICD-10-CM | POA: Diagnosis present

## 2020-05-21 DIAGNOSIS — Z79899 Other long term (current) drug therapy: Secondary | ICD-10-CM | POA: Diagnosis not present

## 2020-05-21 DIAGNOSIS — Z515 Encounter for palliative care: Secondary | ICD-10-CM

## 2020-05-21 DIAGNOSIS — F32A Depression, unspecified: Secondary | ICD-10-CM | POA: Diagnosis present

## 2020-05-21 DIAGNOSIS — F17210 Nicotine dependence, cigarettes, uncomplicated: Secondary | ICD-10-CM | POA: Diagnosis present

## 2020-05-21 DIAGNOSIS — I110 Hypertensive heart disease with heart failure: Secondary | ICD-10-CM | POA: Diagnosis present

## 2020-05-21 DIAGNOSIS — I2582 Chronic total occlusion of coronary artery: Secondary | ICD-10-CM | POA: Diagnosis present

## 2020-05-21 DIAGNOSIS — Z01812 Encounter for preprocedural laboratory examination: Secondary | ICD-10-CM | POA: Diagnosis not present

## 2020-05-21 DIAGNOSIS — I25119 Atherosclerotic heart disease of native coronary artery with unspecified angina pectoris: Secondary | ICD-10-CM | POA: Diagnosis present

## 2020-05-21 DIAGNOSIS — T82855A Stenosis of coronary artery stent, initial encounter: Secondary | ICD-10-CM | POA: Diagnosis present

## 2020-05-21 DIAGNOSIS — I5021 Acute systolic (congestive) heart failure: Secondary | ICD-10-CM | POA: Diagnosis not present

## 2020-05-21 DIAGNOSIS — Z01818 Encounter for other preprocedural examination: Secondary | ICD-10-CM

## 2020-05-21 DIAGNOSIS — I2510 Atherosclerotic heart disease of native coronary artery without angina pectoris: Secondary | ICD-10-CM

## 2020-05-21 DIAGNOSIS — Z20822 Contact with and (suspected) exposure to covid-19: Secondary | ICD-10-CM | POA: Diagnosis present

## 2020-05-21 DIAGNOSIS — Z6839 Body mass index (BMI) 39.0-39.9, adult: Secondary | ICD-10-CM | POA: Diagnosis not present

## 2020-05-21 DIAGNOSIS — Z0000 Encounter for general adult medical examination without abnormal findings: Secondary | ICD-10-CM

## 2020-05-21 DIAGNOSIS — I5023 Acute on chronic systolic (congestive) heart failure: Secondary | ICD-10-CM

## 2020-05-21 DIAGNOSIS — F411 Generalized anxiety disorder: Secondary | ICD-10-CM | POA: Diagnosis present

## 2020-05-21 DIAGNOSIS — E118 Type 2 diabetes mellitus with unspecified complications: Secondary | ICD-10-CM

## 2020-05-21 DIAGNOSIS — Z7982 Long term (current) use of aspirin: Secondary | ICD-10-CM | POA: Diagnosis not present

## 2020-05-21 DIAGNOSIS — Z8249 Family history of ischemic heart disease and other diseases of the circulatory system: Secondary | ICD-10-CM

## 2020-05-21 DIAGNOSIS — I255 Ischemic cardiomyopathy: Secondary | ICD-10-CM | POA: Diagnosis present

## 2020-05-21 HISTORY — PX: RIGHT/LEFT HEART CATH AND CORONARY ANGIOGRAPHY: CATH118266

## 2020-05-21 LAB — POCT I-STAT EG7
Acid-Base Excess: 0 mmol/L (ref 0.0–2.0)
Acid-Base Excess: 1 mmol/L (ref 0.0–2.0)
Bicarbonate: 26.5 mmol/L (ref 20.0–28.0)
Bicarbonate: 27.2 mmol/L (ref 20.0–28.0)
Calcium, Ion: 1.17 mmol/L (ref 1.15–1.40)
Calcium, Ion: 1.24 mmol/L (ref 1.15–1.40)
HCT: 43 % (ref 39.0–52.0)
HCT: 44 % (ref 39.0–52.0)
Hemoglobin: 14.6 g/dL (ref 13.0–17.0)
Hemoglobin: 15 g/dL (ref 13.0–17.0)
O2 Saturation: 57 %
O2 Saturation: 57 %
Potassium: 4.2 mmol/L (ref 3.5–5.1)
Potassium: 4.3 mmol/L (ref 3.5–5.1)
Sodium: 138 mmol/L (ref 135–145)
Sodium: 138 mmol/L (ref 135–145)
TCO2: 28 mmol/L (ref 22–32)
TCO2: 29 mmol/L (ref 22–32)
pCO2, Ven: 46.7 mmHg (ref 44.0–60.0)
pCO2, Ven: 48.4 mmHg (ref 44.0–60.0)
pH, Ven: 7.358 (ref 7.250–7.430)
pH, Ven: 7.362 (ref 7.250–7.430)
pO2, Ven: 31 mmHg — CL (ref 32.0–45.0)
pO2, Ven: 31 mmHg — CL (ref 32.0–45.0)

## 2020-05-21 LAB — BASIC METABOLIC PANEL
Anion gap: 11 (ref 5–15)
BUN: 17 mg/dL (ref 6–20)
CO2: 22 mmol/L (ref 22–32)
Calcium: 9.2 mg/dL (ref 8.9–10.3)
Chloride: 102 mmol/L (ref 98–111)
Creatinine, Ser: 1.05 mg/dL (ref 0.61–1.24)
GFR calc non Af Amer: >60 mL/min (ref >60)
Glucose, Bld: 250 mg/dL — ABNORMAL HIGH (ref 70–99)
Potassium: 4 mmol/L (ref 3.5–5.1)
Sodium: 135 mmol/L (ref 135–145)

## 2020-05-21 LAB — GLUCOSE, CAPILLARY
Glucose-Capillary: 256 mg/dL — ABNORMAL HIGH (ref 70–99)
Glucose-Capillary: 338 mg/dL — ABNORMAL HIGH (ref 70–99)
Glucose-Capillary: 295 mg/dL — ABNORMAL HIGH (ref 70–99)
Glucose-Capillary: 237 mg/dL — ABNORMAL HIGH (ref 70–99)
Glucose-Capillary: 233 mg/dL — ABNORMAL HIGH (ref 70–99)

## 2020-05-21 LAB — CREATININE, SERUM
Creatinine, Ser: 1.15 mg/dL (ref 0.61–1.24)
GFR calc non Af Amer: >60 mL/min (ref >60)

## 2020-05-21 LAB — URINALYSIS, ROUTINE W REFLEX MICROSCOPIC
Bilirubin Urine: NEGATIVE
Glucose, UA: >=500 mg/dL — AB
Ketones, ur: NEGATIVE mg/dL
Leukocytes,Ua: NEGATIVE
Nitrite: NEGATIVE
Protein, ur: 100 mg/dL — AB
Specific Gravity, Urine: 1.014 (ref 1.005–1.030)
pH: 5 (ref 5.0–8.0)

## 2020-05-21 LAB — CBC WITH DIFFERENTIAL/PLATELET
Abs Immature Granulocytes: 0.05 10*3/uL (ref 0.00–0.07)
Basophils Absolute: 0.1 10*3/uL (ref 0.0–0.1)
Basophils Relative: 1 %
Eosinophils Absolute: 0.3 10*3/uL (ref 0.0–0.5)
Eosinophils Relative: 3 %
HCT: 47.8 % (ref 39.0–52.0)
Hemoglobin: 16 g/dL (ref 13.0–17.0)
Immature Granulocytes: 1 %
Lymphocytes Relative: 25 %
Lymphs Abs: 2.2 10*3/uL (ref 0.7–4.0)
MCH: 29.3 pg (ref 26.0–34.0)
MCHC: 33.5 g/dL (ref 30.0–36.0)
MCV: 87.5 fL (ref 80.0–100.0)
Monocytes Absolute: 0.7 10*3/uL (ref 0.1–1.0)
Monocytes Relative: 8 %
Neutro Abs: 5.4 10*3/uL (ref 1.7–7.7)
Neutrophils Relative %: 62 %
Platelets: 187 10*3/uL (ref 150–400)
RBC: 5.46 MIL/uL (ref 4.22–5.81)
RDW: 12.3 % (ref 11.5–15.5)
WBC: 8.6 10*3/uL (ref 4.0–10.5)
nRBC: 0 % (ref 0.0–0.2)

## 2020-05-21 LAB — ECHOCARDIOGRAM COMPLETE
Area-P 1/2: 3.27 cm2
Height: 67 in
S' Lateral: 7 cm
Weight: 4144 oz

## 2020-05-21 LAB — HIV ANTIBODY (ROUTINE TESTING W REFLEX): HIV Screen 4th Generation wRfx: NONREACTIVE

## 2020-05-21 LAB — HEPATIC FUNCTION PANEL
ALT: 26 U/L (ref 0–44)
AST: 20 U/L (ref 15–41)
Albumin: 3.5 g/dL (ref 3.5–5.0)
Alkaline Phosphatase: 74 U/L (ref 38–126)
Bilirubin, Direct: 0.1 mg/dL (ref 0.0–0.2)
Indirect Bilirubin: 0.9 mg/dL (ref 0.3–0.9)
Total Bilirubin: 1 mg/dL (ref 0.3–1.2)
Total Protein: 7.1 g/dL (ref 6.5–8.1)

## 2020-05-21 LAB — RAPID URINE DRUG SCREEN, HOSP PERFORMED
Amphetamines: NOT DETECTED
Barbiturates: NOT DETECTED
Benzodiazepines: POSITIVE — AB
Cocaine: NOT DETECTED
Opiates: NOT DETECTED
Tetrahydrocannabinol: POSITIVE — AB

## 2020-05-21 LAB — COOXEMETRY PANEL
Carboxyhemoglobin: 2.1 % — ABNORMAL HIGH (ref 0.5–1.5)
Methemoglobin: 1 % (ref 0.0–1.5)
O2 Saturation: 59.7 %
Total hemoglobin: 15.3 g/dL (ref 12.0–16.0)

## 2020-05-21 LAB — BRAIN NATRIURETIC PEPTIDE: B Natriuretic Peptide: 161.1 pg/mL — ABNORMAL HIGH (ref 0.0–100.0)

## 2020-05-21 LAB — TSH: TSH: 1.307 u[IU]/mL (ref 0.350–4.500)

## 2020-05-21 SURGERY — RIGHT/LEFT HEART CATH AND CORONARY ANGIOGRAPHY
Anesthesia: LOCAL

## 2020-05-21 MED ORDER — NITROGLYCERIN 0.4 MG SL SUBL
0.4000 mg | SUBLINGUAL_TABLET | SUBLINGUAL | Status: DC | PRN
  Administered 2020-05-21: 0.4 mg via SUBLINGUAL
  Filled 2020-05-21: qty 1

## 2020-05-21 MED ORDER — TRIAMCINOLONE ACETONIDE 0.1 % EX CREA
1.0000 "application " | TOPICAL_CREAM | Freq: Every day | CUTANEOUS | Status: DC | PRN
  Filled 2020-05-21: qty 15

## 2020-05-21 MED ORDER — FUROSEMIDE 10 MG/ML IJ SOLN: 40.0000 mg | Freq: Two times a day (BID) | INTRAMUSCULAR | Status: DC

## 2020-05-21 MED ORDER — PERFLUTREN LIPID MICROSPHERE
1.0000 mL | INTRAVENOUS | Status: AC | PRN
  Administered 2020-05-21: 3 mL via INTRAVENOUS
  Filled 2020-05-21: qty 10

## 2020-05-21 MED ORDER — HYDRALAZINE HCL 20 MG/ML IJ SOLN: 10.0000 mg | INTRAMUSCULAR | Status: AC | PRN

## 2020-05-21 MED ORDER — CHLORHEXIDINE GLUCONATE CLOTH 2 % EX PADS: 6.0000 | MEDICATED_PAD | Freq: Every day | CUTANEOUS | Status: DC

## 2020-05-21 MED ORDER — ASPIRIN 81 MG PO CHEW: 81.0000 mg | CHEWABLE_TABLET | ORAL | Status: DC

## 2020-05-21 MED ORDER — SACUBITRIL-VALSARTAN 49-51 MG PO TABS
1.0000 | ORAL_TABLET | Freq: Two times a day (BID) | ORAL | Status: DC
  Administered 2020-05-21 – 2020-05-22 (×3): 1 via ORAL
  Filled 2020-05-21 (×5): qty 1

## 2020-05-21 MED ORDER — SODIUM CHLORIDE 0.9 % IV SOLN: 250.0000 mL | INTRAVENOUS | Status: DC | PRN

## 2020-05-21 MED ORDER — ASPIRIN EC 81 MG PO TBEC
81.0000 mg | DELAYED_RELEASE_TABLET | Freq: Every day | ORAL | Status: DC
  Administered 2020-05-22 – 2020-05-24 (×3): 81 mg via ORAL
  Filled 2020-05-21 (×4): qty 1

## 2020-05-21 MED ORDER — MIDAZOLAM HCL 2 MG/2ML IJ SOLN
INTRAMUSCULAR | Status: DC | PRN
  Administered 2020-05-21 (×2): 1 mg via INTRAVENOUS

## 2020-05-21 MED ORDER — LABETALOL HCL 5 MG/ML IV SOLN: 10.0000 mg | INTRAVENOUS | Status: AC | PRN

## 2020-05-21 MED ORDER — SODIUM CHLORIDE 0.9 % IV SOLN: INTRAVENOUS | Status: DC

## 2020-05-21 MED ORDER — INSULIN ASPART 100 UNIT/ML ~~LOC~~ SOLN
0.0000 [IU] | Freq: Three times a day (TID) | SUBCUTANEOUS | Status: DC
  Administered 2020-05-21: 5 [IU] via SUBCUTANEOUS
  Administered 2020-05-21: 8 [IU] via SUBCUTANEOUS
  Administered 2020-05-22: 5 [IU] via SUBCUTANEOUS
  Administered 2020-05-22: 2 [IU] via SUBCUTANEOUS
  Administered 2020-05-23 (×2): 3 [IU] via SUBCUTANEOUS
  Administered 2020-05-24 (×2): 5 [IU] via SUBCUTANEOUS

## 2020-05-21 MED ORDER — ASPIRIN 81 MG PO TBEC: 81.0000 mg | DELAYED_RELEASE_TABLET | Freq: Every day | ORAL | Status: DC

## 2020-05-21 MED ORDER — MILRINONE LACTATE IN DEXTROSE 20-5 MG/100ML-% IV SOLN
0.1250 ug/kg/min | INTRAVENOUS | Status: DC
  Administered 2020-05-21 – 2020-05-22 (×2): 0.125 ug/kg/min via INTRAVENOUS
  Filled 2020-05-21 (×2): qty 100

## 2020-05-21 MED ORDER — CARVEDILOL 6.25 MG PO TABS
6.2500 mg | ORAL_TABLET | Freq: Two times a day (BID) | ORAL | Status: DC
  Filled 2020-05-21: qty 1

## 2020-05-21 MED ORDER — SPIRONOLACTONE 25 MG PO TABS
25.0000 mg | ORAL_TABLET | Freq: Every day | ORAL | Status: DC
  Administered 2020-05-21 – 2020-05-24 (×4): 25 mg via ORAL
  Filled 2020-05-21 (×6): qty 1

## 2020-05-21 MED ORDER — BIOTENE DRY MOUTH MT LIQD: 1.0000 "application " | Freq: Every day | OROMUCOSAL | Status: DC | PRN

## 2020-05-21 MED ORDER — HEPARIN SODIUM (PORCINE) 1000 UNIT/ML IJ SOLN
INTRAMUSCULAR | Status: AC
  Filled 2020-05-21: qty 1

## 2020-05-21 MED ORDER — ACETAMINOPHEN 325 MG PO TABS: 650.0000 mg | ORAL_TABLET | ORAL | Status: DC | PRN

## 2020-05-21 MED ORDER — FENTANYL CITRATE (PF) 100 MCG/2ML IJ SOLN
INTRAMUSCULAR | Status: AC
  Filled 2020-05-21: qty 2

## 2020-05-21 MED ORDER — SODIUM CHLORIDE 0.9% FLUSH: 3.0000 mL | INTRAVENOUS | Status: DC | PRN

## 2020-05-21 MED ORDER — INSULIN GLARGINE 100 UNIT/ML ~~LOC~~ SOLN
20.0000 [IU] | Freq: Every day | SUBCUTANEOUS | Status: DC
  Administered 2020-05-21: 20 [IU] via SUBCUTANEOUS
  Filled 2020-05-21 (×2): qty 0.2

## 2020-05-21 MED ORDER — VERAPAMIL HCL 2.5 MG/ML IV SOLN
INTRAVENOUS | Status: DC | PRN
  Administered 2020-05-21: 10 mL via INTRA_ARTERIAL

## 2020-05-21 MED ORDER — ENOXAPARIN SODIUM 40 MG/0.4ML ~~LOC~~ SOLN
40.0000 mg | Freq: Every day | SUBCUTANEOUS | Status: DC
  Administered 2020-05-22 – 2020-05-23 (×2): 40 mg via SUBCUTANEOUS
  Filled 2020-05-21 (×3): qty 0.4

## 2020-05-21 MED ORDER — HEPARIN (PORCINE) IN NACL 1000-0.9 UT/500ML-% IV SOLN
INTRAVENOUS | Status: AC
  Filled 2020-05-21: qty 1000

## 2020-05-21 MED ORDER — SODIUM CHLORIDE 0.9% FLUSH
3.0000 mL | Freq: Two times a day (BID) | INTRAVENOUS | Status: DC
  Administered 2020-05-22: 3 mL via INTRAVENOUS

## 2020-05-21 MED ORDER — SODIUM CHLORIDE 0.9% FLUSH
3.0000 mL | Freq: Two times a day (BID) | INTRAVENOUS | Status: DC
  Administered 2020-05-21 – 2020-05-23 (×3): 3 mL via INTRAVENOUS

## 2020-05-21 MED ORDER — ONDANSETRON HCL 4 MG/2ML IJ SOLN: 4.0000 mg | Freq: Four times a day (QID) | INTRAMUSCULAR | Status: DC | PRN

## 2020-05-21 MED ORDER — HEPARIN (PORCINE) IN NACL 1000-0.9 UT/500ML-% IV SOLN
INTRAVENOUS | Status: DC | PRN
  Administered 2020-05-21: 500 mL

## 2020-05-21 MED ORDER — ATORVASTATIN CALCIUM 80 MG PO TABS
80.0000 mg | ORAL_TABLET | Freq: Every day | ORAL | Status: DC
  Administered 2020-05-21 – 2020-05-23 (×3): 80 mg via ORAL
  Filled 2020-05-21 (×3): qty 1

## 2020-05-21 MED ORDER — FUROSEMIDE 10 MG/ML IJ SOLN
40.0000 mg | Freq: Once | INTRAMUSCULAR | Status: DC
  Filled 2020-05-21: qty 4

## 2020-05-21 MED ORDER — DIGOXIN 125 MCG PO TABS
0.1250 mg | ORAL_TABLET | Freq: Every day | ORAL | Status: DC
  Administered 2020-05-21 – 2020-05-24 (×4): 0.125 mg via ORAL
  Filled 2020-05-21 (×4): qty 1

## 2020-05-21 MED ORDER — MIDAZOLAM HCL 2 MG/2ML IJ SOLN
INTRAMUSCULAR | Status: AC
  Filled 2020-05-21: qty 2

## 2020-05-21 MED ORDER — VERAPAMIL HCL 2.5 MG/ML IV SOLN
INTRAVENOUS | Status: AC
  Filled 2020-05-21: qty 2

## 2020-05-21 MED ORDER — SODIUM CHLORIDE 0.9% FLUSH
3.0000 mL | Freq: Two times a day (BID) | INTRAVENOUS | Status: DC
  Administered 2020-05-21: 3 mL via INTRAVENOUS

## 2020-05-21 MED ORDER — IOHEXOL 350 MG/ML SOLN
INTRAVENOUS | Status: DC | PRN
  Administered 2020-05-21: 50 mL

## 2020-05-21 MED ORDER — FUROSEMIDE 40 MG PO TABS
40.0000 mg | ORAL_TABLET | Freq: Every day | ORAL | Status: DC
  Filled 2020-05-21: qty 1

## 2020-05-21 MED ORDER — FENTANYL CITRATE (PF) 100 MCG/2ML IJ SOLN
INTRAMUSCULAR | Status: DC | PRN
Start: 2020-05-21 — End: 2020-05-21
  Administered 2020-05-21 (×2): 25 ug via INTRAVENOUS

## 2020-05-21 MED ORDER — CARVEDILOL 12.5 MG PO TABS
12.5000 mg | ORAL_TABLET | Freq: Two times a day (BID) | ORAL | Status: DC
  Administered 2020-05-21: 12.5 mg via ORAL
  Filled 2020-05-21 (×2): qty 1

## 2020-05-21 MED ORDER — TICAGRELOR 90 MG PO TABS
90.0000 mg | ORAL_TABLET | Freq: Once | ORAL | Status: AC
  Administered 2020-05-21: 90 mg via ORAL
  Filled 2020-05-21: qty 1

## 2020-05-21 MED ORDER — LIDOCAINE HCL (PF) 1 % IJ SOLN
INTRAMUSCULAR | Status: DC | PRN
  Administered 2020-05-21 (×2): 3 mL

## 2020-05-21 MED ORDER — HEPARIN SODIUM (PORCINE) 1000 UNIT/ML IJ SOLN
INTRAMUSCULAR | Status: DC | PRN
  Administered 2020-05-21: 5000 [IU] via INTRAVENOUS

## 2020-05-21 MED ORDER — POTASSIUM CHLORIDE CRYS ER 20 MEQ PO TBCR
40.0000 meq | EXTENDED_RELEASE_TABLET | Freq: Every day | ORAL | Status: DC
  Administered 2020-05-21 – 2020-05-24 (×4): 40 meq via ORAL
  Filled 2020-05-21 (×5): qty 2

## 2020-05-21 MED ORDER — LIDOCAINE HCL (PF) 1 % IJ SOLN
INTRAMUSCULAR | Status: AC
  Filled 2020-05-21: qty 30

## 2020-05-21 MED ORDER — BUPROPION HCL ER (XL) 150 MG PO TB24
150.0000 mg | ORAL_TABLET | Freq: Every day | ORAL | Status: DC
  Administered 2020-05-21 – 2020-05-22 (×2): 150 mg via ORAL
  Filled 2020-05-21 (×2): qty 1

## 2020-05-21 MED ORDER — SODIUM CHLORIDE 0.9% FLUSH: 10.0000 mL | INTRAVENOUS | Status: DC | PRN

## 2020-05-21 MED ORDER — ONDANSETRON HCL 4 MG/2ML IJ SOLN
INTRAMUSCULAR | Status: DC | PRN
  Administered 2020-05-21: 4 mg via INTRAVENOUS

## 2020-05-21 MED ORDER — LIVING WELL WITH DIABETES BOOK
Freq: Once | Status: AC
  Filled 2020-05-21: qty 1

## 2020-05-21 MED ORDER — ONDANSETRON HCL 4 MG/2ML IJ SOLN
INTRAMUSCULAR | Status: AC
  Filled 2020-05-21: qty 2

## 2020-05-21 MED ORDER — ENOXAPARIN SODIUM 40 MG/0.4ML ~~LOC~~ SOLN: 40.0000 mg | SUBCUTANEOUS | Status: DC

## 2020-05-21 MED ORDER — SODIUM CHLORIDE 0.9% FLUSH
10.0000 mL | Freq: Two times a day (BID) | INTRAVENOUS | Status: DC
  Administered 2020-05-21 – 2020-05-23 (×3): 10 mL

## 2020-05-21 MED ORDER — INSULIN ASPART 100 UNIT/ML ~~LOC~~ SOLN
0.0000 [IU] | Freq: Every day | SUBCUTANEOUS | Status: DC
  Administered 2020-05-21 – 2020-05-22 (×2): 2 [IU] via SUBCUTANEOUS

## 2020-05-21 SURGICAL SUPPLY — 10 items
CATH 5FR JL3.5 JR4 ANG PIG MP (CATHETERS) ×1 IMPLANT
CATH BALLN WEDGE 5F 110CM (CATHETERS) ×1 IMPLANT
DEVICE RAD COMP TR BAND LRG (VASCULAR PRODUCTS) ×1 IMPLANT
GLIDESHEATH SLEND SS 6F .021 (SHEATH) ×1 IMPLANT
GUIDEWIRE INQWIRE 1.5J.035X260 (WIRE) IMPLANT
INQWIRE 1.5J .035X260CM (WIRE) ×2
KIT HEART LEFT (KITS) ×2 IMPLANT
PACK CARDIAC CATHETERIZATION (CUSTOM PROCEDURE TRAY) ×2 IMPLANT
SHEATH GLIDE SLENDER 4/5FR (SHEATH) ×1 IMPLANT
TRANSDUCER W/STOPCOCK (MISCELLANEOUS) ×2 IMPLANT

## 2020-05-21 NOTE — Progress Notes (Signed)
Peripherally Inserted Central Catheter Placement  The IV Nurse has discussed with the patient and/or persons authorized to consent for the patient, the purpose of this procedure and the potential benefits and risks involved with this procedure.  The benefits include less needle sticks, lab draws from the catheter, and the patient may be discharged home with the catheter. Risks include, but not limited to, infection, bleeding, blood clot (thrombus formation), and puncture of an artery; nerve damage and irregular heartbeat and possibility to perform a PICC exchange if needed/ordered by physician.  Alternatives to this procedure were also discussed.  Bard Power PICC patient education guide, fact sheet on infection prevention and patient information card has been provided to patient /or left at bedside.    PICC Placement Documentation  PICC Double Lumen 05/21/20 PICC Right Basilic 37 cm 0 cm (Active)  Indication for Insertion or Continuance of Line Vasoactive infusions 05/21/20 1740  Exposed Catheter (cm) 0 cm 05/21/20 1740  Site Assessment Clean;Dry;Intact 05/21/20 1740  Lumen #1 Status Flushed;Saline locked;Blood return noted 05/21/20 1740  Lumen #2 Status Flushed;Saline locked;Blood return noted 05/21/20 1740  Dressing Type Transparent 05/21/20 1740  Dressing Status Clean;Dry;Intact 05/21/20 1740  Antimicrobial disc in place? Not Applicable 05/21/20 1740  Dressing Intervention New dressing 05/21/20 1740  Dressing Change Due 05/28/20 05/21/20 1740       Ethelda Chick 05/21/2020, 5:41 PM

## 2020-05-21 NOTE — Progress Notes (Signed)
  Echocardiogram 2D Echocardiogram has been performed.  Augustine Radar 05/21/2020, 4:18 PM

## 2020-05-21 NOTE — Progress Notes (Addendum)
Inpatient Diabetes Program Recommendations  AACE/ADA: New Consensus Statement on Inpatient Glycemic Control (2015)  Target Ranges:  Prepandial:   less than 140 mg/dL      Peak postprandial:   less than 180 mg/dL (1-2 hours)      Critically ill patients:  140 - 180 mg/dL   Lab Results  Component Value Date   GLUCAP 256 (H) 05/21/2020   HGBA1C 11.3 (H) 02/26/2020    Review of Glycemic Control Results for ERCEL, PEPITONE (MRN [id number]) as of 05/21/2020 09:42  Ref. Range 05/21/2020 05:40  Glucose-Capillary Latest Ref Range: 70 - 99 mg/dL 222 (H)   Diabetes history: DM 2 Outpatient Diabetes medications:  Metformin 500 mg bid Current orders for Inpatient glycemic control:  Novolog moderate tid with meals and HS Inpatient Diabetes Program Recommendations:   Please consider adding Lantus 20 units daily.  Also consider rechecking A1C?   Thanks,  Beryl Meager, RN, BC-ADM Inpatient Diabetes Coordinator Pager [phone] (8a-5p)  1500 Spoke with patient regarding A1C of 11.3%.  Patient has not been checking blood sugars at home nor has he f/u with PCP stating that he has a hard time getting an appointment.  Discussed with patient goal A1C of 7% and average blood sugars of 150 mg/dL.  Discussed importance of glycemic control for his heart.  Patient verbalized understanding.  Discussed potential need for insulin at d/c.  Patient states that he has received a lot of information today and that he "hopes he won't need insulin".  He states that he has not been drinking sodas since diagnosis. Will order LWWD booklet for patient and also ask dietician to see.  Will follow.  If patient will need insulin at discharge will need additional teaching.

## 2020-05-21 NOTE — Interval H&P Note (Signed)
History and Physical Interval Note:  05/21/2020 7:50 AM  Peter Russo  has presented today for surgery, with the diagnosis of heart failure.  The various methods of treatment have been discussed with the patient and family. After consideration of risks, benefits and other options for treatment, the patient has consented to  Procedure(s): RIGHT/LEFT HEART CATH AND CORONARY ANGIOGRAPHY (N/A) as a surgical intervention.  The patient's history has been reviewed, patient examined, no change in status, stable for surgery.  I have reviewed the patient's chart and labs.  Questions were answered to the patient's satisfaction.     [name]

## 2020-05-21 NOTE — Progress Notes (Signed)
VAD Coordinator met with patient and significant other Peter Russo) per Dr. Claris Gladden request. Dr. Cyndia Bent in room talking with both re: LVAD as a surgical option.   Initial VAD teaching completed with pt and caregiver.   VAD educational packet including "Understanding Your Options with Advanced Heart Failure", "[place] Patient Agreement for VAD Evaluation and Potential Implantation" consent, and [employer] "Living a More Active Life" HM III booklet", "[place] HM III Patient Education", "[place] Mechanical Circulatory Support Program", and "Decision Aids for Left Ventricular Assist Device" reviewed in detail and left at bedside for continued reference.  Peter Russo identified herself as primary caregiver after explaining need for 24/7 care when pt is discharged home.   Explained that LVAD can be implanted for two indications in the setting of advanced left ventricular heart failure treatment:  1. Bridge to transplant - used for patients who cannot safely wait for heart transplant without this device.  Or    2. Destination therapy - used for patients until end of life or recovery of heart function.                                            The patient understands that from this discussion it does not mean that they will receive the device, but that depends on an extensive evaluation process. The patient is aware of the fact that if at anytime they want to stop the evaluation process they can.  Pt very overwhelmed at this time and wants time to think about and discuss with Peter Russo. VAD Coordinator will return tomorrow to continue discussion, answer questions, and consent patient if agreeable.    Peter Sarna, LCSW left to continue speaking with patient and his caregiver.    [name], RN VAD Coordinator   Office: [phone] 24/7 VAD Pager: [phone]

## 2020-05-21 NOTE — Consult Note (Signed)
[address]       [place] [address]             [phone]      Cardiothoracic Surgery Consultation   Reason for Consult: Ischemic cardiomyopathy with acute on chronic systolic congestive heart failure. Referring Physician: Dr. Marca Ancona  Peter Russo is an 44 y.o. male.  HPI:   The patient is a 44 year old gentleman with a history of OSA on CPAP, hypertension, hyperlipidemia, poorly controlled diabetes, morbid obesity, and coronary artery disease status post multiple MIs and PCI's since 2010.  He has known chronic occlusion of the RCA and said multiple stents in the LAD.  His ejection fraction in 2018 was 40% and was down to 20 to 25% in 07/2018.  His most recent echocardiogram and 02/2020 showed an ejection fraction of 25 to 30% and an LV diastolic diameter of 6.5 cm and a systolic diameter 5.3 cm.  He was seen in clinic recently for cardiac clearance for circumcision surgery and related having chest pain walking up stairs and during sex resolved with nitroglycerin.  He reported having 3-4 episodes per week and it was becoming more frequent recently.  He has been having shortness of breath going up stairs or with heavy exertion but has no difficulty walking on flat ground around his house.  He does get outside but does not do any dedicated walking.  He denies orthopnea and PND.  He denies peripheral edema.  He continues to smoke a few cigarettes per day but was smoking 1 pack/day until recently.  He denies alcohol and drug use.  He has been on disability for cardiac problems.  He was scheduled for right and left heart cath today which showed a known occluded RCA.  The proximal LAD was occluded with minimal collaterals to the distal vessel.  The prior stents extended into the distal vessel.  Left circumflex is widely patent with some irregularities.  PA pressure was 47/13 with a mean of 28.  Mean right atrial pressure was 6.  Pulmonary vascular resistance was 2.4  WU and cardiac index was 1.48.  Mean pulmonary capillary wedge pressure is 20.  PA saturation was 57% with arterial saturation 99%.  He was admitted after catheterization for management of his low output heart failure.  The patient lives with his girlfriend who is here with him today.  They have been together for a long time.  Past Medical History:  Diagnosis Date  . Anxiety   . CAD S/P percutaneous coronary angioplasty    a. s/p BMS-mLAD 2010. b. DES to RCA 2012. c. 04/2015: DES to LCx and PCI to LAD c/b dissection with subsequent overlapping DES to mLAD - r/i for NSTEMI afterwards; d. 08/2015 Ant STEMI in setting of noncompliance->136mLAD ISR (3.0x16 Synergy DES); e. 03/2016 PCI of apical LAD; f. 03/2016 Relook Cath: patent LAD stents, RCA 100p CTO-->Med Rx; g. 10/2016 Ant STEMI: PTCA 100p LAD.  Marland Kitchen Chronic combined systolic and diastolic CHF (congestive heart failure) (HCC)    a. 03/2016 Echo: EF 30-35%, Gr2 DD;  b. 08/2016 Echo: EF 40%.  . Depression   . Essential hypertension   . GAD (generalized anxiety disorder)   . GERD (gastroesophageal reflux disease)   . Hypercholesteremia   . Ischemic cardiomyopathy    a. prior EF 25-35 percent at cath, 35-40 percent by echo; b. 03/2016 Echo: EF 30-35%, diff HK, Gr2 DD; c. 08/2016 Echo: EF 40%, base/mid inferior/inferowseptal AK, mildly dil  LA.  . Morbid obesity (HCC)   . Pre-diabetes   . SVT (supraventricular tachycardia) (HCC) APRROX 10 YRS AGO   PRESUMED AVNRT, ECHO 12/07 WITH EF 65%, MILD LAE  . Tobacco abuse    a. 20 + pack years, quit 10/2016.    Past Surgical History:  Procedure Laterality Date  . CARDIAC CATHETERIZATION N/A 04/23/2015   Procedure: Left Heart Cath and Coronary Angiography;  Surgeon: Wendall Stade, MD;  Location: [facility] INVASIVE CV LAB;  Service: Cardiovascular;  Laterality: N/A;  . CARDIAC CATHETERIZATION N/A 04/23/2015   Procedure: Coronary Stent Intervention;  Surgeon: Tonny Bollman, MD;  Location: [facility] INVASIVE CV LAB;  Service:  Cardiovascular;  Laterality: N/A;  . CARDIAC CATHETERIZATION N/A 09/14/2015   Procedure: Left Heart Cath and Coronary Angiography;  Surgeon: Lyn Records, MD;  LAD 100%, CFX 10% ISR, RCA 100% (chronic), EF 25-35%  . CARDIAC CATHETERIZATION N/A 09/14/2015   Procedure: Coronary Stent Intervention;  Surgeon: Lyn Records, MD;  Location: [facility] INVASIVE CV LAB;  Service: Cardiovascular;  Laterality: N/A; 3.0 x 16 mm Synergy DES LAD  . CARDIAC CATHETERIZATION  03/16/2016  . CARDIAC CATHETERIZATION N/A 03/16/2016   Procedure: Left Heart Cath and Coronary Angiography;  Surgeon: Lyn Records, MD;  Location: [facility] INVASIVE CV LAB;  Service: Cardiovascular;  Laterality: N/A;  . CARDIAC CATHETERIZATION N/A 03/16/2016   Procedure: Coronary Balloon Angioplasty;  Surgeon: Lyn Records, MD;  Location: [facility] INVASIVE CV LAB;  Service: Cardiovascular;  Laterality: N/A;  . CARDIAC CATHETERIZATION N/A 03/25/2016   Procedure: Left Heart Cath and Coronary Angiography;  Surgeon: Peter M Swaziland, MD;  Location: [facility] INVASIVE CV LAB;  Service: Cardiovascular;  Laterality: N/A;  . CORONARY ANGIOPLASTY WITH STENT PLACEMENT  2010; 2013  . CORONARY BALLOON ANGIOPLASTY N/A 10/19/2016   Procedure: Coronary Balloon Angioplasty;  Surgeon: Corky Crafts, MD;  Location[facility] INVAS[facility] CV LAB;  Service: Cardiovascular;  Laterality: N/A;  . INTRAVASCULAR ULTRASOUND/IVUS N/A 10/19/2016   Procedure: Intravascular Ultrasound/IVUS;  Surgeon: Corky Crafts, MD;  Location: [facility] INVASIVE CV LAB;  Service: Cardiovascular;  Laterality: N/A;  . LEFT HEART CATH AND CORONARY ANGIOGRAPHY N/A 10/19/2016   Procedure: Left Heart Cath and Coronary Angiography;  Surgeon: Corky Crafts, MD;  Location: [facility] INVASIVE CV LAB;  Service: Cardiovascular;  Laterality: N/A;  . RIGHT/LEFT HEART CATH AND CORONARY ANGIOGRAPHY N/A 05/21/2020   Procedure: RIGHT/LEFT HEART CATH AND CORONARY ANGIOGRAPHY;  Surgeon: Laurey Morale, MD;  Location: [facility] INVASIVE CV LAB;  Service:  Cardiovascular;  Laterality: N/A;    Family History  Problem Relation Age of Onset  . Arrhythmia Mother        MOTHER LIVED TO BE 89  . Coronary artery disease Mother   . Heart attack Mother   . Hypertension Mother   . Coronary artery disease Father 15  . Heart disease Father   . Heart attack Father   . Heart attack Brother   . Stroke Neg Hx     Social History:  reports that he quit smoking about 3 years ago. He has a 24.00 pack-year smoking history. He has never used smokeless tobacco. He reports current drug use. Drug: Marijuana. He reports that he does not drink alcohol.  Allergies: No Known Allergies  Medications:  I have reviewed the patient's current medications. Prior to Admission:  Medications Prior to Admission  Medication Sig Dispense Refill Last Dose  . acetaminophen (TYLENOL) 325 MG tablet Take 2 tablets (650 mg total) by mouth every  4 (four) hours as needed for headache or mild pain.   05/20/2020 at Unknown time  . antiseptic oral rinse (BIOTENE) LIQD 1 application by Mouth Rinse route daily as needed for dry mouth.   Past Week at Unknown time  . aspirin 81 MG EC tablet Take 1 tablet (81 mg total) by mouth daily. 90 tablet 3 05/21/2020 at 0430  . atorvastatin (LIPITOR) 80 MG tablet TAKE 1 TABLET BY MOUTH EVERY DAY AT 6 PM (Patient taking differently: Take 80 mg by mouth daily at 6 PM. ) 90 tablet 1 05/20/2020 at Unknown time  . buPROPion (WELLBUTRIN XL) 150 MG 24 hr tablet Take 1 tablet (150 mg total) by mouth daily. 90 tablet 3 05/20/2020 at Unknown time  . carvedilol (COREG) 12.5 MG tablet TAKE 1 TABLET (12.5 MG TOTAL) BY MOUTH 2 (TWO) TIMES DAILY. 180 tablet 1 05/20/2020 at Unknown time  . furosemide (LASIX) 40 MG tablet Take 1 tablet (40 mg total) by mouth daily. 90 tablet 3 05/20/2020 at Unknown time  . metFORMIN (GLUCOPHAGE) 500 MG tablet Take 1 tablet (500 mg total) by mouth 2 (two) times daily with a meal. 60 tablet 0 05/20/2020 at Unknown time  . nitroGLYCERIN  (NITROSTAT) 0.4 MG SL tablet PLACE 1 TABLET UNDER TONGUE EVERY 5 MINS, UP TO 3 DOSES AS NEEDED FOR CHEST PAIN (Patient taking differently: Place 0.4 mg under the tongue every 5 (five) minutes as needed for chest pain. ) 25 tablet 2 Past Week at Unknown time  . sacubitril-valsartan (ENTRESTO) 97-103 MG Take 1 tablet by mouth 2 (two) times daily. 60 tablet 3 05/20/2020 at Unknown time  . spironolactone (ALDACTONE) 25 MG tablet Take 1 tablet (25 mg total) by mouth daily. 90 tablet 3 05/20/2020 at Unknown time  . ticagrelor (BRILINTA) 90 MG TABS tablet Take 1 tablet (90 mg total) by mouth 2 (two) times daily. 180 tablet 3 05/20/2020 at 2100  . triamcinolone cream (KENALOG) 0.1 % Apply 1 application topically 2 (two) times daily. (Patient taking differently: Apply 1 application topically daily as needed (rash). ) 30 g 0 Past Month at Unknown time  . clotrimazole-betamethasone (LOTRISONE) cream Apply 1 application topically 2 (two) times daily. (Patient taking differently: Apply 1 application topically daily as needed (Rash). ) 30 g 0 More than a month at Unknown time   Scheduled: . [START ON 05/22/2020] aspirin EC  81 mg Oral Daily  . atorvastatin  80 mg Oral q1800  . buPROPion  150 mg Oral Daily  . carvedilol  12.5 mg Oral BID  . digoxin  0.125 mg Oral Daily  . [START ON 05/22/2020] enoxaparin (LOVENOX) injection  40 mg Subcutaneous Daily  . furosemide  40 mg Intravenous Once  . [START ON 05/22/2020] furosemide  40 mg Oral Daily  . insulin aspart  0-15 Units Subcutaneous TID WC  . insulin aspart  0-5 Units Subcutaneous QHS  . insulin glargine  20 Units Subcutaneous Daily  . potassium chloride  40 mEq Oral Daily  . sacubitril-valsartan  1 tablet Oral BID  . sodium chloride flush  3 mL Intravenous Q12H  . sodium chloride flush  3 mL Intravenous Q12H  . sodium chloride flush  3 mL Intravenous Q12H  . spironolactone  25 mg Oral Daily   Continuous: . sodium chloride    . sodium chloride 10 mL/hr at  05/21/20 0557  . sodium chloride    . sodium chloride    . milrinone     GEX:BMWUXL chloride, sodium  chloride, sodium chloride, acetaminophen, antiseptic oral rinse, hydrALAZINE, labetalol, nitroGLYCERIN, ondansetron (ZOFRAN) IV, sodium chloride flush, sodium chloride flush, sodium chloride flush, triamcinolone cream Anti-infectives (From admission, onward)   None      Results for orders placed or performed during the hospital encounter of 05/21/20 (from the past 48 hour(s))  Glucose, capillary     Status: Abnormal   Collection Time: 05/21/20  5:40 AM  Result Value Ref Range   Glucose-Capillary 256 (H) 70 - 99 mg/dL    Comment: Glucose reference range applies only to samples taken after fasting for at least 8 hours.  Basic metabolic panel     Status: Abnormal   Collection Time: 05/21/20  5:57 AM  Result Value Ref Range   Sodium 135 135 - 145 mmol/L   Potassium 4.0 3.5 - 5.1 mmol/L   Chloride 102 98 - 111 mmol/L   CO2 22 22 - 32 mmol/L   Glucose, Bld 250 (H) 70 - 99 mg/dL    Comment: Glucose reference range applies only to samples taken after fasting for at least 8 hours.   BUN 17 6 - 20 mg/dL   Creatinine, Ser 1.611.05 0.61 - 1.24 mg/dL   Calcium 9.2 8.9 - 09.610.3 mg/dL   GFR calc non Af Amer >60 >60 mL/min   Anion gap 11 5 - 15    Comment: Performed at [facility], [address] [place], [place] [address]  POCT I-Stat EG7     Status: Abnormal   Collection Time: 05/21/20  8:09 AM  Result Value Ref Range   pH, Ven 7.358 7.25 - 7.43   pCO2, Ven 48.4 44 - 60 mmHg   pO2, Ven 31.0 (LL) 32 - 45 mmHg   Bicarbonate 27.2 20.0 - 28.0 mmol/L   TCO2 29 22 - 32 mmol/L   O2 Saturation 57.0 %   Acid-Base Excess 1.0 0.0 - 2.0 mmol/L   Sodium 138 135 - 145 mmol/L   Potassium 4.3 3.5 - 5.1 mmol/L   Calcium, Ion 1.24 1.15 - 1.40 mmol/L   HCT 44.0 39 - 52 %   Hemoglobin 15.0 13.0 - 17.0 g/dL   Sample type MIXED VENOUS SAMPLE   POCT I-Stat EG7     Status: Abnormal   Collection Time:  05/21/20  8:09 AM  Result Value Ref Range   pH, Ven 7.362 7.25 - 7.43   pCO2, Ven 46.7 44 - 60 mmHg   pO2, Ven 31.0 (LL) 32 - 45 mmHg   Bicarbonate 26.5 20.0 - 28.0 mmol/L   TCO2 28 22 - 32 mmol/L   O2 Saturation 57.0 %   Acid-Base Excess 0.0 0.0 - 2.0 mmol/L   Sodium 138 135 - 145 mmol/L   Potassium 4.2 3.5 - 5.1 mmol/L   Calcium, Ion 1.17 1.15 - 1.40 mmol/L   HCT 43.0 39 - 52 %   Hemoglobin 14.6 13.0 - 17.0 g/dL   Sample type MIXED VENOUS SAMPLE   HIV Antibody (routine testing w rflx)     Status: None   Collection Time: 05/21/20 10:37 AM  Result Value Ref Range   HIV Screen 4th Generation wRfx Non Reactive Non Reactive    Comment: Performed at [facility], [address] [place], [place] [address]  Creatinine, serum     Status: None   Collection Time: 05/21/20 10:37 AM  Result Value Ref Range   Creatinine, Ser 1.15 0.61 - 1.24 mg/dL   GFR calc non Af Amer >60 >60  mL/min    Comment: Performed at [facility], [address] [place], [place] [address]  Brain natriuretic peptide     Status: Abnormal   Collection Time: 05/21/20 10:37 AM  Result Value Ref Range   B Natriuretic Peptide 161.1 (H) 0.0 - 100.0 pg/mL    Comment: Performed at [facility], [address], [place], [place] [address]  TSH     Status: None   Collection Time: 05/21/20 10:37 AM  Result Value Ref Range   TSH 1.307 0.350 - 4.500 uIU/mL    Comment: Performed by a 3rd Generation assay with a functional sensitivity of <=0.01 uIU/mL. Performed at [facility], [address], [place], [place] [address]   CBC WITH DIFFERENTIAL     Status: None   Collection Time: 05/21/20 10:37 AM  Result Value Ref Range   WBC 8.6 4.0 - 10.5 K/uL   RBC 5.46 4.22 - 5.81 MIL/uL   Hemoglobin 16.0 13.0 - 17.0 g/dL   HCT 91.4 39 - 52 %   MCV 87.5 80.0 - 100.0 fL   MCH 29.3 26.0 - 34.0 pg   MCHC 33.5 30.0 - 36.0 g/dL   RDW 78.2 95.6 - 21.3 %   Platelets 187 150 - 400 K/uL   nRBC 0.0 0.0 - 0.2 %    Neutrophils Relative % 62 %   Neutro Abs 5.4 1.7 - 7.7 K/uL   Lymphocytes Relative 25 %   Lymphs Abs 2.2 0.7 - 4.0 K/uL   Monocytes Relative 8 %   Monocytes Absolute 0.7 0.1 - 1.0 K/uL   Eosinophils Relative 3 %   Eosinophils Absolute 0.3 0 - 0 K/uL   Basophils Relative 1 %   Basophils Absolute 0.1 0 - 0 K/uL   Immature Granulocytes 1 %   Abs Immature Granulocytes 0.05 0.00 - 0.07 K/uL    Comment: Performed at [facility], [address], [place], [place] [address]  Hepatic function panel     Status: None   Collection Time: 05/21/20 10:37 AM  Result Value Ref Range   Total Protein 7.1 6.5 - 8.1 g/dL   Albumin 3.5 3.5 - 5.0 g/dL   AST 20 15 - 41 U/L   ALT 26 0 - 44 U/L   Alkaline Phosphatase 74 38 - 126 U/L   Total Bilirubin 1.0 0.3 - 1.2 mg/dL   Bilirubin, Direct 0.1 0.0 - 0.2 mg/dL   Indirect Bilirubin 0.9 0.3 - 0.9 mg/dL    Comment: Performed at [facility], [address] [place], [place] [address]  Glucose, capillary     Status: Abnormal   Collection Time: 05/21/20 11:43 AM  Result Value Ref Range   Glucose-Capillary 338 (H) 70 - 99 mg/dL    Comment: Glucose reference range applies only to samples taken after fasting for at least 8 hours.   Comment 1 Notify RN    Comment 2 Document in Chart     CARDIAC CATHETERIZATION  Result Date: 05/21/2020 1. Elevated PCWP. 2. Mild pulmonary venous hypertension. 3. Low cardiac output. 4. Occluded RCA (known from past).  Occluded proximal LAD with minimal collaterals.  LCx is patent. I do not think we have a revascularization option, multiple overlapping LAD stents, now occluded.  I do not think there are good surgical targets.  Reviewed with Dr. Swaziland. I will admit and review options with our surgeons.   [place] EKG SITE RITE  Result Date: 05/21/2020 If Site  Rite image not attached, placement could not be confirmed due to current cardiac rhythm.   Review of Systems  Constitutional: Positive for fatigue. Negative for  activity change and appetite change.  HENT: Negative for dental problem.   Eyes: Negative.   Respiratory: Positive for shortness of breath.   Cardiovascular: Positive for chest pain. Negative for palpitations and leg swelling.  Gastrointestinal:       Reflux  Endocrine: Negative.   Genitourinary: Negative.   Musculoskeletal: Negative.   Allergic/Immunologic: Negative.   Neurological: Negative for dizziness, syncope and light-headedness.  Hematological: Negative.   Psychiatric/Behavioral: The patient is nervous/anxious.    Blood pressure 127/84, pulse 72, temperature 97.7 F (36.5 C), temperature source Oral, resp. rate 19, height 5\' 7"  (1.702 m), weight 117.5 kg, SpO2 97 %. Physical Exam Constitutional:      Appearance: He is obese.     Comments: BMI 40.57 kg/m  HENT:     Head: Normocephalic and atraumatic.  Eyes:     Extraocular Movements: Extraocular movements intact.     Conjunctiva/sclera: Conjunctivae normal.     Pupils: Pupils are equal, round, and reactive to light.  Neck:     Vascular: No carotid bruit.  Cardiovascular:     Rate and Rhythm: Normal rate and regular rhythm.     Pulses: Normal pulses.     Heart sounds: No murmur heard.   Pulmonary:     Effort: Pulmonary effort is normal.     Breath sounds: Normal breath sounds.  Abdominal:     Tenderness: There is no abdominal tenderness.  Musculoskeletal:        General: No swelling.     Cervical back: Normal range of motion and neck supple.  Lymphadenopathy:     Cervical: No cervical adenopathy.  Skin:    General: Skin is warm and dry.  Neurological:     General: No focal deficit present.     Mental Status: He is alert and oriented to person, place, and time.  Psychiatric:        Mood and Affect: Mood normal.        Behavior: Behavior normal.    ECHOCARDIOGRAM REPORT       Patient Name:  [name] Date of Exam: 02/26/2020  Medical Rec #: [id number]     Height:    67.0 in  Accession  #:  [id number]    Weight:    274.6 lb  Date of Birth: [birth year]     BSA:     2.314 m  Patient Age:  43 years     BP:      120/78 mmHg  Patient Gender: M         HR:      81 bpm.  Exam Location: [address]   Procedure: 2D Echo, Cardiac Doppler, Color Doppler and Intracardiac       Opacification Agent   Indications:  I25.5 Ischemic cardiomyopathy; LV function reassessment    History:    Patient has prior history of Echocardiogram examinations,  most         recent 07/30/2018. Previous Myocardial Infarction and CAD,         Signs/Symptoms:Chest Pain; Risk Factors:Hypertension,         Dyslipidemia and Current Smoker. Prediabetes. Obesity.  History         of medical noncompliance.    Sonographer:  Cathie Beams RCS  Referring Phys: [phone] Duke Salvia   IMPRESSIONS    1. Left ventricular  ejection fraction, by estimation, is 25 to 30%. The  left ventricle has severely decreased function. The left ventricle has no  regional wall motion abnormalities. The left ventricular internal cavity  size was moderately dilated. There  is mild left ventricular hypertrophy. Left ventricular diastolic  parameters are consistent with Grade I diastolic dysfunction (impaired  relaxation).  2. Right ventricular systolic function is normal. The right ventricular  size is normal.  3. The mitral valve is normal in structure. Trivial mitral valve  regurgitation. No evidence of mitral stenosis.  4. The aortic valve is normal in structure. Aortic valve regurgitation is  not visualized. No aortic stenosis is present.  5. The inferior vena cava is normal in size with greater than 50%  respiratory variability, suggesting right atrial pressure of 3 mmHg.   Comparison(s): No significant change from prior study. Prior images  reviewed side by side.   FINDINGS  Left Ventricle: Left ventricular  ejection fraction, by estimation, is 25  to 30%. The left ventricle has severely decreased function. The left  ventricle has no regional wall motion abnormalities. The left ventricular  internal cavity size was moderately  dilated. There is mild left ventricular hypertrophy. Left ventricular  diastolic parameters are consistent with Grade I diastolic dysfunction  (impaired relaxation).   Right Ventricle: The right ventricular size is normal. No increase in  right ventricular wall thickness. Right ventricular systolic function is  normal.   Left Atrium: Left atrial size was normal in size.   Right Atrium: Right atrial size was normal in size.   Pericardium: There is no evidence of pericardial effusion.   Mitral Valve: The mitral valve is normal in structure. Normal mobility of  the mitral valve leaflets. Trivial mitral valve regurgitation. No evidence  of mitral valve stenosis.   Tricuspid Valve: The tricuspid valve is normal in structure. Tricuspid  valve regurgitation is not demonstrated. No evidence of tricuspid  stenosis.   Aortic Valve: The aortic valve is normal in structure. Aortic valve  regurgitation is not visualized. No aortic stenosis is present.   Pulmonic Valve: The pulmonic valve was normal in structure. Pulmonic valve  regurgitation is not visualized. No evidence of pulmonic stenosis.   Aorta: The aortic root is normal in size and structure.   Venous: The inferior vena cava is normal in size with greater than 50%  respiratory variability, suggesting right atrial pressure of 3 mmHg.   IAS/Shunts: No atrial level shunt detected by color flow Doppler.     LEFT VENTRICLE  PLAX 2D  LVIDd:     6.50 cm Diastology  LVIDs:     5.30 cm LV e' lateral:  4.56 cm/s  LV PW:     1.30 cm LV E/e' lateral: 13.0  LV IVS:    0.90 cm LV e' medial:  3.37 cm/s  LVOT diam:   2.25 cm LV E/e' medial: 17.5  LV SV:     50  LV SV Index:  22  LVOT  Area:   3.98 cm     RIGHT VENTRICLE  RV S prime:   7.68 cm/s   LEFT ATRIUM       Index  LA diam:    4.10 cm 1.77 cm/m  LA Vol (A2C):  66.8 ml 28.87 ml/m  LA Vol (A4C):  59.1 ml 25.54 ml/m  LA Biplane Vol: 62.4 ml 26.97 ml/m  AORTIC VALVE  LVOT Vmax:  74.40 cm/s  LVOT Vmean: 48.800 cm/s  LVOT VTI:  0.126 m  AORTA  Ao Root diam: 3.00 cm   MITRAL VALVE  MV Area (PHT): 4.24 cm  SHUNTS  MV Decel Time: 179 msec  Systemic VTI: 0.13 m  MV E velocity: 59.10 cm/s Systemic Diam: 2.25 cm  MV A velocity: 77.10 cm/s  MV E/A ratio: 0.77   Donato Schultz MD  Electronically signed by Donato Schultz MD  Signature Date/Time: 02/26/2020/1:09:05 PM      Final    Physicians  Panel Physicians Referring Physician Case Authorizing Physician  Laurey Morale, MD (Primary)    Procedures  RIGHT/LEFT HEART CATH AND CORONARY ANGIOGRAPHY  Conclusion  1. Elevated PCWP.  2. Mild pulmonary venous hypertension.  3. Low cardiac output.  4. Occluded RCA (known from past).  Occluded proximal LAD with minimal collaterals.  LCx is patent.   I do not think we have a revascularization option, multiple overlapping LAD stents, now occluded.  I do not think there are good surgical targets.  Reviewed with Dr. Swaziland.   I will admit and review options with our surgeons.  Procedural Details  Technical Details Procedure: Right Heart Cath, Left Heart Cath, Selective Coronary Angiography  Indication: Angina, ischemic cardiomyopathy   Procedural Details: The right brachial and radial areas were prepped, draped, and anesthetized with 1% lidocaine. There was a pre-existing peripheral IV that was replaced with a 77F venous sheath. A Swan-Ganz catheter was used for the right heart catheterization. Standard protocol was followed for recording of right heart pressures and sampling of oxygen saturations. Fick cardiac output was calculated. Standard Judkins catheters were used for  selective coronary angiography. There were no immediate procedural complications. The patient was transferred to the post catheterization recovery area for further monitoring.   Estimated blood loss <50 mL.   During this procedure medications were administered to achieve and maintain moderate conscious sedation while the patient's heart rate, blood pressure, and oxygen saturation were continuously monitored and I was present face-to-face 100% of this time.  Medications (Filter: Administrations occurring from 0720 to 0843 on 05/21/20) (important) Continuous medications are totaled by the amount administered until 05/21/20 0843.  Heparin (Porcine) in NaCl 1000-0.9 UT/500ML-% SOLN (mL) Total volume:  500 mL Date/Time  Rate/Dose/Volume Action  05/21/20 0735  500 mL Given    Heparin (Porcine) in NaCl 1000-0.9 UT/500ML-% SOLN (mL) Total volume:  500 mL Date/Time  Rate/Dose/Volume Action  05/21/20 0735  500 mL Given    fentaNYL (SUBLIMAZE) injection (mcg) Total dose:  50 mcg Date/Time  Rate/Dose/Volume Action  05/21/20 0749  25 mcg Given  0810  25 mcg Given    midazolam (VERSED) injection (mg) Total dose:  2 mg Date/Time  Rate/Dose/Volume Action  05/21/20 0749  1 mg Given  0811  1 mg Given    lidocaine (PF) (XYLOCAINE) 1 % injection (mL) Total volume:  6 mL Date/Time  Rate/Dose/Volume Action  05/21/20 0802  3 mL Given  0803  3 mL Given    Radial Cocktail/Verapamil only (mL) Total volume:  10 mL Date/Time  Rate/Dose/Volume Action  05/21/20 0811  10 mL Given    heparin sodium (porcine) injection (Units) Total dose:  5,000 Units Date/Time  Rate/Dose/Volume Action  05/21/20 0812  5,000 Units Given    ondansetron (ZOFRAN) injection (mg) Total dose:  4 mg Date/Time  Rate/Dose/Volume Action  05/21/20 0815  4 mg Given    iohexol (OMNIPAQUE) 350 MG/ML injection (mL) Total volume:  50 mL Date/Time  Rate/Dose/Volume Action  05/21/20 0837  50 mL Given  Sedation  Time  Sedation Time Physician-1: 43 minutes 24 seconds  Contrast  Medication Name Total Dose  iohexol (OMNIPAQUE) 350 MG/ML injection 50 mL    Radiation/Fluoro  Fluoro time: 5.6 (min) DAP: 25749 (mGycm2) Cumulative Air Kerma: 524 (mGy)  Coronary Findings  Diagnostic Dominance: Right Left Main  No left main, separate ostial LAD and LCx.  Left Anterior Descending  Long stented segment proximal to distal LAD. 60% proximal LAD stenosis, then proximal LAD is totally occluded at the ostium of the long stented segment. There are collaterals from LCx to a diagonal, but do not see significant collaterals to the LAD itself.  Left Circumflex  30% mid LCx stenosis. The LCx provides collaterals to a diagonal.  Right Coronary Artery  Long stented segment proximal to mid RCA, RCA occluded proximally within stent. There are faint right-right collaterals.  Intervention  No interventions have been documented. Right Heart  Right Heart Pressures RHC Procedural Findings: Hemodynamics (mmHg) RA mean 6 RV 43/9 PA 47/13, mean 28 PCWP mean 20 LV 95/30 AO 101/64  Oxygen saturations: PA 57% AO 99%  Cardiac Output (Fick) 3.35  Cardiac Index (Fick) 1.48 PVR 2.4 WU    Assessment/Plan:  This 44 year old gentleman has ischemic cardiomyopathy with an ejection fraction of 25 to 30% by echocardiogram July 2021 with acute on chronic systolic congestive heart failure with low cardiac output of 1.5 on right heart cath today.  He has New York Heart Association class II-III symptoms of exertional fatigue and shortness of breath as well as some episodes of chest pain.  He had an elevated mean pulmonary capillary wedge pressure of 20 and mild pulmonary hypertension.  Mean right atrial pressure was low and prior echocardiogram that showed mild RV dysfunction.  Left heart catheterization showed known chronic RCA occlusion as well as occlusion of the proximal LAD with minimal collaterals and no significant  distal vessel present.  The left circumflex is patent.  I do not think there is any role for revascularization in this patient.  He is not a candidate for cardiac transplantation given his BMI of 40.57 kg/m, poorly controlled diabetes with a hemoglobin A1c of 11.3 and ongoing smoking.  I think he would probably be a reasonable candidate for left ventricular assist device implantation although I think he needs to get his diabetes under much better control and completely abstain from smoking prior to doing that.  His risk for wound complications and driveline infection would certainly be increased due to his obesity and poorly controlled diabetes.  He has been started on milrinone due to his low ejection fraction and low cardiac output as well as optimal medical therapy.  He has been scheduled for repeat 2D echocardiogram.  I discussed LVAD implantation with the patient and his girlfriend and further work-up prior to our multidisciplinary team making a final decision about his candidacy.  I stressed the importance of getting his diabetes under much better control and completely abstaining from smoking.  He will be discussed at our medical review board after all members of the team have had a chance to evaluate him.   I spent 60 minutes performing this consultation and > 50% of this time was spent face to face counseling and coordinating the care of this patient's ischemic cardiomyopathy and acute on chronic systolic congestive heart failure.   Payton Doughty Zaleigh Bermingham 05/21/2020, 1:21 PM

## 2020-05-21 NOTE — Plan of Care (Signed)
  Problem: Cardiovascular: Goal: Vascular access site(s) Level 0-1 will be maintained Outcome: Progressing   Problem: Clinical Measurements: Goal: Respiratory complications will improve Outcome: Progressing   

## 2020-05-21 NOTE — H&P (Addendum)
Advanced Heart Failure Team History and Physical Note   PCP:  Hoy Register, MD  PCP-Cardiology: Charlton Haws, MD    AHFC: Dr. Shirlee Latch   Reason for Admission: A/c Systolic HF w/ Low-output + Obstructive CAD   HPI:    44 y.o. with history of CAD s/p multiple MIs and PCIs, ischemic cardiomyopathy, HTN, active smoking, and poorly controlled diabetes.  Patient has had multiple coronary interventions, first in 2010.  His RCA is now chronically occluded, and he has had many stents in the LAD.  Echo in 1/18 showed EF 40%.  EF was down to 20-25% in 12/19.  Echo in 7/21 was reported as EF 25-30%, moderate LV enlargement, normal RV.  Of note, patient seen in urgent care in 7/21 and noted to have blood glucose > 400.  HgbA1c was 11.3.  He was started on metformin at urgent care, but has not seen his PCP since that time.   He was seen in clinic on 9/27 for surgical clearance prior to planned circumcision surgery. Blood glucose was >300 and he had endorsed increasing exertional CP and dyspnea. He was referred for [facility].   Coronary angiography today showed occluded RCA (known from past) and occluded pLAD w/ minimal collaterals. His LCx is patent. RHC demonstrated elevated PCWP, mild pulmonary venous hypertension and low cardiac output. CI 1.48.   He is being directly admitted from cath lab for management of a/c systolic HF w/ low output and surgical consultation for potential CABG vs LVAD.     RIGHT/LEFT HEART CATH AND CORONARY ANGIOGRAPHY  Conclusion  1. Elevated PCWP.  2. Mild pulmonary venous hypertension.  3. Low cardiac output.  4. Occluded RCA (known from past).  Occluded proximal LAD with minimal collaterals.  LCx is patent.     Right Heart Pressures RHC Procedural Findings: Hemodynamics (mmHg) RA mean 6 RV 43/9 PA 47/13, mean 28 PCWP mean 20 LV 95/30 AO 101/64  Oxygen saturations: PA 57% AO 99%  Cardiac Output (Fick) 3.35  Cardiac Index (Fick) 1.48 PVR 2.4 WU      Review of Systems: [y] = yes, [ ]  = no   General: Weight gain [ ] ; Weight loss [ ] ; Anorexia [ ] ; Fatigue [ ] ; Fever [ ] ; Chills [ ] ; Weakness [ ]   Cardiac: Chest pain/pressure [ Y]; Resting SOB [ ] ; Exertional SOB [ Y]; Orthopnea [ ] ; Pedal Edema [ ] ; Palpitations [ ] ; Syncope [ ] ; Presyncope [ ] ; Paroxysmal nocturnal dyspnea[ ]   Pulmonary: Cough [ ] ; Wheezing[ ] ; Hemoptysis[ ] ; Sputum [ ] ; Snoring [ ]   GI: Vomiting[ ] ; Dysphagia[ ] ; Melena[ ] ; Hematochezia [ ] ; Heartburn[ ] ; Abdominal pain [ ] ; Constipation [ ] ; Diarrhea [ ] ; BRBPR [ ]   GU: Hematuria[ ] ; Dysuria [ ] ; Nocturia[ ]   Vascular: Pain in legs with walking [ ] ; Pain in feet with lying flat [ ] ; Non-healing sores [ ] ; Stroke [ ] ; TIA [ ] ; Slurred speech [ ] ;  Neuro: Headaches[ ] ; Vertigo[ ] ; Seizures[ ] ; Paresthesias[ ] ;Blurred vision [ ] ; Diplopia [ ] ; Vision changes [ ]   Ortho/Skin: Arthritis [ ] ; Joint pain [ ] ; Muscle pain [ ] ; Joint swelling [ ] ; Back Pain [ ] ; Rash [ ]   Psych: Depression[ ] ; Anxiety[ ]   Heme: Bleeding problems [ ] ; Clotting disorders [ ] ; Anemia [ ]   Endocrine: Diabetes [ Y]; Thyroid dysfunction[ ]    Home Medications Prior to Admission medications   Medication Sig Start Date End Date Taking? Authorizing Provider  acetaminophen (TYLENOL)  325 MG tablet Take 2 tablets (650 mg total) by mouth every 4 (four) hours as needed for headache or mild pain. 11/13/16  Yes Kilroy, Eda Paschal, PA-C  antiseptic oral rinse (BIOTENE) LIQD 1 application by Mouth Rinse route daily as needed for dry mouth. 02/20/20  Yes [provider]  aspirin 81 MG EC tablet Take 1 tablet (81 mg total) by mouth daily. 10/21/16  Yes Duke, Roe Rutherford, PA  atorvastatin (LIPITOR) 80 MG tablet TAKE 1 TABLET BY MOUTH EVERY DAY AT 6 PM Patient taking differently: Take 80 mg by mouth daily at 6 PM.  12/24/19  Yes Wendall Stade, MD  buPROPion (WELLBUTRIN XL) 150 MG 24 hr tablet Take 1 tablet (150 mg total) by mouth daily. 12/03/19  Yes Wendall Stade, MD  carvedilol (COREG) 12.5 MG tablet TAKE 1 TABLET (12.5 MG TOTAL) BY MOUTH 2 (TWO) TIMES DAILY. 02/03/20 05/11/21 Yes Duke Salvia, MD  furosemide (LASIX) 40 MG tablet Take 1 tablet (40 mg total) by mouth daily. 09/16/19  Yes Wendall Stade, MD  metFORMIN (GLUCOPHAGE) 500 MG tablet Take 1 tablet (500 mg total) by mouth 2 (two) times daily with a meal. 02/26/20 05/11/21 Yes Wieters, Hallie C, PA-C  nitroGLYCERIN (NITROSTAT) 0.4 MG SL tablet PLACE 1 TABLET UNDER TONGUE EVERY 5 MINS, UP TO 3 DOSES AS NEEDED FOR CHEST PAIN Patient taking differently: Place 0.4 mg under the tongue every 5 (five) minutes as needed for chest pain.  12/24/19  Yes Wendall Stade, MD  sacubitril-valsartan (ENTRESTO) 97-103 MG Take 1 tablet by mouth 2 (two) times daily. 05/11/20  Yes Laurey Morale, MD  spironolactone (ALDACTONE) 25 MG tablet Take 1 tablet (25 mg total) by mouth daily. 11/12/19  Yes Wendall Stade, MD  ticagrelor (BRILINTA) 90 MG TABS tablet Take 1 tablet (90 mg total) by mouth 2 (two) times daily. 07/02/18  Yes Rosalio Macadamia, NP  triamcinolone cream (KENALOG) 0.1 % Apply 1 application topically 2 (two) times daily. Patient taking differently: Apply 1 application topically daily as needed (rash).  02/26/20  Yes Wieters, Hallie C, PA-C  clotrimazole-betamethasone (LOTRISONE) cream Apply 1 application topically 2 (two) times daily. Patient taking differently: Apply 1 application topically daily as needed (Rash).  02/12/20   Anders Simmonds, PA-C    Past Medical History: Past Medical History:  Diagnosis Date  . Anxiety   . CAD S/P percutaneous coronary angioplasty    a. s/p BMS-mLAD 2010. b. DES to RCA 2012. c. 04/2015: DES to LCx and PCI to LAD c/b dissection with subsequent overlapping DES to mLAD - r/i for NSTEMI afterwards; d. 08/2015 Ant STEMI in setting of noncompliance->167mLAD ISR (3.0x16 Synergy DES); e. 03/2016 PCI of apical LAD; f. 03/2016 Relook Cath: patent LAD stents, RCA 100p CTO-->Med  Rx; g. 10/2016 Ant STEMI: PTCA 100p LAD.  Marland Kitchen Chronic combined systolic and diastolic CHF (congestive heart failure) (HCC)    a. 03/2016 Echo: EF 30-35%, Gr2 DD;  b. 08/2016 Echo: EF 40%.  . Depression   . Essential hypertension   . GAD (generalized anxiety disorder)   . GERD (gastroesophageal reflux disease)   . Hypercholesteremia   . Ischemic cardiomyopathy    a. prior EF 25-35 percent at cath, 35-40 percent by echo; b. 03/2016 Echo: EF 30-35%, diff HK, Gr2 DD; c. 08/2016 Echo: EF 40%, base/mid inferior/inferowseptal AK, mildly dil LA.  . Morbid obesity (HCC)   . Pre-diabetes   . SVT (supraventricular tachycardia) (HCC) APRROX 10  YRS AGO   PRESUMED AVNRT, ECHO 12/07 WITH EF 65%, MILD LAE  . Tobacco abuse    a. 20 + pack years, quit 10/2016.    Past Surgical History: Past Surgical History:  Procedure Laterality Date  . CARDIAC CATHETERIZATION N/A 04/23/2015   Procedure: Left Heart Cath and Coronary Angiography;  Surgeon: Wendall Stade, MD;  Location: [facility] INVASIVE CV LAB;  Service: Cardiovascular;  Laterality: N/A;  . CARDIAC CATHETERIZATION N/A 04/23/2015   Procedure: Coronary Stent Intervention;  Surgeon: Tonny Bollman, MD;  Location: [facility] INVASIVE CV LAB;  Service: Cardiovascular;  Laterality: N/A;  . CARDIAC CATHETERIZATION N/A 09/14/2015   Procedure: Left Heart Cath and Coronary Angiography;  Surgeon: Lyn Records, MD;  LAD 100%, CFX 10% ISR, RCA 100% (chronic), EF 25-35%  . CARDIAC CATHETERIZATION N/A 09/14/2015   Procedure: Coronary Stent Intervention;  Surgeon: Lyn Records, MD;  Location: [facility] INVASIVE CV LAB;  Service: Cardiovascular;  Laterality: N/A; 3.0 x 16 mm Synergy DES LAD  . CARDIAC CATHETERIZATION  03/16/2016  . CARDIAC CATHETERIZATION N/A 03/16/2016   Procedure: Left Heart Cath and Coronary Angiography;  Surgeon: Lyn Records, MD;  Location: [facility] INVASIVE CV LAB;  Service: Cardiovascular;  Laterality: N/A;  . CARDIAC CATHETERIZATION N/A 03/16/2016   Procedure: Coronary Balloon  Angioplasty;  Surgeon: Lyn Records, MD;  Location: [facility] INVASIVE CV LAB;  Service: Cardiovascular;  Laterality: N/A;  . CARDIAC CATHETERIZATION N/A 03/25/2016   Procedure: Left Heart Cath and Coronary Angiography;  Surgeon: Peter M Swaziland, MD;  Location: [facility] INVASIVE CV LAB;  Service: Cardiovascular;  Laterality: N/A;  . CORONARY ANGIOPLASTY WITH STENT PLACEMENT  2010; 2013  . CORONARY BALLOON ANGIOPLASTY N/A 10/19/2016   Procedure: Coronary Balloon Angioplasty;  Surgeon: Corky Crafts, MD;  Location: [facility] INVASIVE CV LAB;  Service: Cardiovascular;  Laterality: N/A;  . INTRAVASCULAR ULTRASOUND/IVUS N/A 10/19/2016   Procedure: Intravascular Ultrasound/IVUS;  Surgeon: Corky Crafts, MD;  Location: [facility] INVASIVE CV LAB;  Service: Cardiovascular;  Laterality: N/A;  . LEFT HEART CATH AND CORONARY ANGIOGRAPHY N/A 10/19/2016   Procedure: Left Heart Cath and Coronary Angiography;  Surgeon: Corky Crafts, MD;  Location: [facility] INVASIVE CV LAB;  Service: Cardiovascular;  Laterality: N/A;    Family History:  Family History  Problem Relation Age of Onset  . Arrhythmia Mother        MOTHER LIVED TO BE 89  . Coronary artery disease Mother   . Heart attack Mother   . Hypertension Mother   . Coronary artery disease Father 51  . Heart disease Father   . Heart attack Father   . Heart attack Brother   . Stroke Neg Hx     Social History: Social History   Socioeconomic History  . Marital status: Significant Other    Spouse name: Not on file  . Number of children: Not on file  . Years of education: Not on file  . Highest education level: Not on file  Occupational History  . Occupation: Agricultural engineer: [employer]  Tobacco Use  . Smoking status: Former Smoker    Packs/day: 1.00    Years: 24.00    Pack years: 24.00    Quit date: 11/03/2016    Years since quitting: 3.5  . Smokeless tobacco: Never Used  . Tobacco comment: quit on 03/15/16  Vaping Use  . Vaping Use: Never used   Substance and Sexual Activity  . Alcohol use: No  . Drug use: Yes  Types: Marijuana    Comment: last time 02/2016  . Sexual activity: Yes  Other Topics Concern  . Not on file  Social History Narrative   [place]ives in [place] by himself.  Sometimes stays with girlfriend in [place].  Does not routinely exercise.   Social Determinants of Health   Financial Resource Strain:   . Difficulty of Paying Living Expenses: Not on file  Food Insecurity:   . Worried About Programme researcher, broadcasting/film/videounning Out of Food in the Last Year: Not on file  . Ran Out of Food in the Last Year: Not on file  Transportation Needs:   . Lack of Transportation (Medical): Not on file  . Lack of Transportation (Non-Medical): Not on file  Physical Activity:   . Days of Exercise per Week: Not on file  . Minutes of Exercise per Session: Not on file  Stress:   . Feeling of Stress : Not on file  Social Connections:   . Frequency of Communication with Friends and Family: Not on file  . Frequency of Social Gatherings with Friends and Family: Not on file  . Attends Religious Services: Not on file  . Active Member of Clubs or Organizations: Not on file  . Attends BankerClub or Organization Meetings: Not on file  . Marital Status: Not on file    Allergies:  No Known Allergies  Objective:    Vital Signs:   Pulse Rate:  [30-79] 58 (10/07 0906) Resp:  [13-25] 19 (10/07 0906) BP: (101-130)/(66-83) 126/71 (10/07 0906) SpO2:  [0 %-100 %] 98 % (10/07 0906) Weight:  [117.5 kg] 117.5 kg (10/07 0534)   Filed Weights   05/21/20 0534  Weight: 117.5 kg     Physical Exam     General:  Obese male, No respiratory difficulty HEENT: Normal Neck: Supple. elevated JVD. Carotids 2+ bilat; no bruits. No lymphadenopathy or thyromegaly appreciated. Cor: PMI nondisplaced. Regular rate & rhythm. No rubs, gallops or murmurs. Lungs: Clear Abdomen: Soft, nontender, nondistended. No hepatosplenomegaly. No bruits or masses. Good bowel sounds. Extremities: No  cyanosis, clubbing, rash, edema Neuro: Alert & oriented x 3, cranial nerves grossly intact. moves all 4 extremities w/o difficulty. Affect pleasant.   Telemetry   NSR 60s   EKG   No new EKG to review   Labs     Basic Metabolic Panel: Recent Labs  Lab 05/21/20 0557  NA 135  K 4.0  CL 102  CO2 22  GLUCOSE 250*  BUN 17  CREATININE 1.05  CALCIUM 9.2    Liver Function Tests: No results for input(s): AST, ALT, ALKPHOS, BILITOT, PROT, ALBUMIN in the last 168 hours. No results for input(s): LIPASE, AMYLASE in the last 168 hours. No results for input(s): AMMONIA in the last 168 hours.  CBC: No results for input(s): WBC, NEUTROABS, HGB, HCT, MCV, PLT in the last 168 hours.  Cardiac Enzymes: No results for input(s): CKTOTAL, CKMB, CKMBINDEX, TROPONINI in the last 168 hours.  BNP: BNP (last 3 results) No results for input(s): BNP in the last 8760 hours.  ProBNP (last 3 results) No results for input(s): PROBNP in the last 8760 hours.   CBG: Recent Labs  Lab 05/21/20 0540  GLUCAP 256*    Coagulation Studies: No results for input(s): LABPROT, INR in the last 72 hours.  Imaging: CARDIAC CATHETERIZATION  Result Date: 05/21/2020 1. Elevated PCWP. 2. Mild pulmonary venous hypertension. 3. Low cardiac output. 4. Occluded RCA (known from past).  Occluded proximal LAD with minimal collaterals.  LCx is patent.  I do not think we have a revascularization option, multiple overlapping LAD stents, now occluded.  I do not think there are good surgical targets.  Reviewed with Dr. Swaziland. I will admit and review options with our surgeons.   [place] EKG SITE RITE  Result Date: 05/21/2020 If Site Rite image not attached, placement could not be confirmed due to current cardiac rhythm.     Assessment/Plan   1. CAD: RCA known to be chronically occluded.  He has had multiple PCIs to LAD.  Repeat LHC this admit done for recurrent angina demonstrated occluded RCA (known from past) and  occluded pLAD w/ minimal collaterals. His LCx is patent. I do not think we have a revascularization option, multiple overlapping LAD stents, now occluded.  I do not think there are good surgical targets.  Reviewed with Dr. Swaziland.  - will review options w/ CT surgery. Dr. Laneta Simmers to see in consultation today  - Hold Brilinta, in anticipation of potential surgery  - Continue ASA 81  - Continue Atorvastatin 80  - Continue Coreg 12.5 bid  2. Acute on Chronic systolic CHF w/ Low Output: Ischemic cardiomyopathy.  Last echo in 7/21 with EF 25-30%.  NYHA class II-III symptoms.  RHC this admit demonstrated elevated PCWP, mild pulmonary venous hypertension and low cardiac output. CI 1.4.  - place PICC line and start milrinone 0.125 mcg/kg/min  - Follow co-ox and CVPs  - Repeat Echo  - Give IV Lasix 40 mg x 1  - Entresto 49-51 bid - Decrease Coreg to 6.25 mg bid with low output.  Cleda Daub 25 daily  - Add digoxin 0.125 daily  - Check Hgb A1c. If < 10, start [place].  - he is not a candidate for cardiac transplant w/ poorly controlled DM and active tobacco use  - Start w/u for potential LVAD. CT surgery and VAD coordinators to see today - He is not a CRT candidate. Will arrange LifeVest at d/c for bridge possible LVAD.   3. Type 2 diabetes: New diagnosis in 7/21 with hgbA1c 11.3.  He is on metformin but has not seen his PCP since diagnosis was made.  - Repeat HgbA1c again today.  - If glucose is not out of control, will need to start [place] eventually for CHF.  - will refer to outpatient endocrinology  - continue statin  4. HTN: BP is controlled.  5. OSA: CPAP QHS  6. Smoking: 1-2 cigs/day. Smoking cessation strongly advised. - Continue Wellbutrin 7. HLD w/ LDL Goal < 70 - LDL elevated on recent lipid panel at 175 mg/dL - continue atorvastatin 80 qhs - plan referral to lipid clinic post d/c for [id number]   Robbie Lis, PA-C 05/21/2020, 9:32 AM  Advanced Heart Failure Team Pager [phone]  (M-F; 7a - 4p)  Please contact [facility] for night-coverage after hours (4p -7a ) and weekends on [email or web address]  Patient seen with PA, agree with the above note.   At baseline, he has NYHA class III symptoms with dyspnea with moderate exertion.  He has frequent exertional angina as well. Symptoms have worsened over the last few months.  Last echo in 7/21 showed EF 25-30%.    RHC/LHC today showed occluded proximal RCA and proximal LAD with minimal collaterals and no revascularization option.  The LCx is patent.  PCWP was elevated and CI was low at 1.48.    I admitted him today for further evaluation/management in the setting of low output HF without revascularization options.   General: NAD  Neck: JVP 8-9 cm, no thyromegaly or thyroid nodule.  Lungs: Clear to auscultation bilaterally with normal respiratory effort. CV: Nondisplaced PMI.  Heart regular S1/S2, no S3/S4, no murmur.  No peripheral edema.  No carotid bruit.  Normal pedal pulses.  Abdomen: Soft, nontender, no hepatosplenomegaly, no distention.  Skin: Intact without lesions or rashes.  Neurologic: Alert and oriented x 3.  Psych: Normal affect. Extremities: No clubbing or cyanosis.  HEENT: Normal.   Low output HF with CI 1.48.  He is not markedly symptomatic, NYHA class III.  No option for coronary vascularization, as above.  Only the LCx remains patent.  - I will start him on milrinone 0.125 for now and place PICC to monitor co-ox.  I am uncertain if he will need to go home on milrinone, will see how his hemodynamic numbers look over the next day or 2.  - Lasix 40 mg IV x 1 with mildly elevated PCWP (RA pressure 6).  Resume po Lasix tomorrow unless CVP higher.   - With soft BP, decrease Entresto back to 49/51 bid.  - With low output, decrease Coreg to 6.25 mg bid.  - Continue spironolactone.  - Add digoxin 0.125.  - I think he should get a Lifevest, will work on setting this up.  - He is not a transplant candidate, quit  smoking only last week. As above, not candidate for revascularization.  I would like to consider him for LVAD, but he will need to get diabetes controlled first. He has been seen by Dr. Laneta Simmers, we will discuss in MRB.  May end up sending him out on home milrinone + Lifevest to get DM controlled and to get a bit more time off cigarettes. - Start LVAD workup in hospital.    Will consult diabetes coordinator and check hgbA1c.  He will need Lantus at night.  Need diabetes under control before considering LVAD.   CAD is not revascularizable.  He has been on ASA and ticagrelor.  Given possible LVAD surgery in the future and that all his stents are already occluded, think he can stay off ticagrelor (versus start him on Plavix if we think surgery will be somewhat distant in the future).  He is on statin but LDL remains markedly high.   He is on wellbutrin for smoking cessation.   Marca Ancona 05/21/2020 2:28 PM

## 2020-05-21 NOTE — Progress Notes (Signed)
CSW met with patient at bedside to provide supportive counseling around adjustment to new diagnosis. Patient very tearful at the news today of his cardiac status. Patient shared that he has lost multiple family members from cardiac disease at early ages. Patient shared concerns of new diagnosis and feeling a bit overwhelmed. CSW provided supportive intervention around coping skills and adjustment to new diagnosis. Patient appears overwhelmed and emotional with new diagnosis.  CSW will continue to provide supportive intervention. Jackie Brennan, LCSW, CCSW-MCS [phone]  

## 2020-05-22 ENCOUNTER — Encounter (HOSPITAL_COMMUNITY): Payer: Self-pay | Admitting: Cardiology

## 2020-05-22 ENCOUNTER — Other Ambulatory Visit (HOSPITAL_COMMUNITY): Payer: Self-pay

## 2020-05-22 ENCOUNTER — Inpatient Hospital Stay (HOSPITAL_COMMUNITY): Payer: Medicare Other

## 2020-05-22 ENCOUNTER — Other Ambulatory Visit: Payer: Self-pay

## 2020-05-22 DIAGNOSIS — I5043 Acute on chronic combined systolic (congestive) and diastolic (congestive) heart failure: Secondary | ICD-10-CM | POA: Diagnosis not present

## 2020-05-22 LAB — BASIC METABOLIC PANEL
Anion gap: 8 (ref 5–15)
BUN: 12 mg/dL (ref 6–20)
CO2: 26 mmol/L (ref 22–32)
Calcium: 8.9 mg/dL (ref 8.9–10.3)
Chloride: 102 mmol/L (ref 98–111)
Creatinine, Ser: 1.08 mg/dL (ref 0.61–1.24)
GFR calc non Af Amer: >60 mL/min (ref >60)
Glucose, Bld: 225 mg/dL — ABNORMAL HIGH (ref 70–99)
Potassium: 4.2 mmol/L (ref 3.5–5.1)
Sodium: 136 mmol/L (ref 135–145)

## 2020-05-22 LAB — ABO/RH: ABO/RH(D): O POS

## 2020-05-22 LAB — LACTATE DEHYDROGENASE: LDH: 148 U/L (ref 98–192)

## 2020-05-22 LAB — HEPATITIS B SURFACE ANTIGEN: Hepatitis B Surface Ag: NONREACTIVE

## 2020-05-22 LAB — BLOOD GAS, ARTERIAL
Acid-Base Excess: 1.8 mmol/L (ref 0.0–2.0)
Bicarbonate: 25.4 mmol/L (ref 20.0–28.0)
Drawn by: 331761
FIO2: 21
O2 Saturation: 96.6 %
Patient temperature: 37
pCO2 arterial: 36.7 mmHg (ref 32.0–48.0)
pH, Arterial: 7.454 — ABNORMAL HIGH (ref 7.350–7.450)
pO2, Arterial: 79.4 mmHg — ABNORMAL LOW (ref 83.0–108.0)

## 2020-05-22 LAB — GLUCOSE, CAPILLARY
Glucose-Capillary: 179 mg/dL — ABNORMAL HIGH (ref 70–99)
Glucose-Capillary: 195 mg/dL — ABNORMAL HIGH (ref 70–99)
Glucose-Capillary: 142 mg/dL — ABNORMAL HIGH (ref 70–99)
Glucose-Capillary: 236 mg/dL — ABNORMAL HIGH (ref 70–99)

## 2020-05-22 LAB — TYPE AND SCREEN
ABO/RH(D): O POS
Antibody Screen: NEGATIVE

## 2020-05-22 LAB — COOXEMETRY PANEL
Carboxyhemoglobin: 2.1 % — ABNORMAL HIGH (ref 0.5–1.5)
Methemoglobin: 0.9 % (ref 0.0–1.5)
O2 Saturation: 67.1 %
Total hemoglobin: 14.5 g/dL (ref 12.0–16.0)

## 2020-05-22 LAB — ANTITHROMBIN III: AntiThromb III Func: 93 % (ref 75–120)

## 2020-05-22 LAB — HEPATITIS C ANTIBODY: HCV Ab: NONREACTIVE

## 2020-05-22 LAB — APTT: aPTT: 25 seconds (ref 24–36)

## 2020-05-22 LAB — PREALBUMIN: Prealbumin: 21.2 mg/dL (ref 18–38)

## 2020-05-22 LAB — MAGNESIUM: Magnesium: 1.8 mg/dL (ref 1.7–2.4)

## 2020-05-22 LAB — PROTIME-INR
INR: 1.1 (ref 0.8–1.2)
Prothrombin Time: 13.5 seconds (ref 11.4–15.2)

## 2020-05-22 LAB — HEPATITIS B CORE ANTIBODY, IGM: Hep B C IgM: NONREACTIVE

## 2020-05-22 LAB — URIC ACID: Uric Acid, Serum: 8.1 mg/dL (ref 3.7–8.6)

## 2020-05-22 LAB — PSA: Prostatic Specific Antigen: 0.23 ng/mL (ref 0.00–4.00)

## 2020-05-22 MED ORDER — FUROSEMIDE 10 MG/ML IJ SOLN
40.0000 mg | Freq: Once | INTRAMUSCULAR | Status: AC
  Administered 2020-05-22: 40 mg via INTRAVENOUS
  Filled 2020-05-22: qty 4

## 2020-05-22 MED ORDER — PROSOURCE PLUS PO LIQD
30.0000 mL | Freq: Two times a day (BID) | ORAL | Status: DC
  Administered 2020-05-22 – 2020-05-24 (×4): 30 mL via ORAL
  Filled 2020-05-22 (×4): qty 30

## 2020-05-22 MED ORDER — INSULIN STARTER KIT- PEN NEEDLES (ENGLISH)
1.0000 | Freq: Once | Status: AC
  Administered 2020-05-22: 1
  Filled 2020-05-22: qty 1

## 2020-05-22 MED ORDER — IOHEXOL 9 MG/ML PO SOLN
500.0000 mL | ORAL | Status: AC
  Administered 2020-05-22 (×2): 500 mL via ORAL

## 2020-05-22 MED ORDER — BUPROPION HCL ER (SR) 150 MG PO TB12
150.0000 mg | ORAL_TABLET | Freq: Two times a day (BID) | ORAL | Status: DC
  Administered 2020-05-23 – 2020-05-24 (×3): 150 mg via ORAL
  Filled 2020-05-22 (×4): qty 1

## 2020-05-22 MED ORDER — FUROSEMIDE 10 MG/ML IJ SOLN
40.0000 mg | Freq: Once | INTRAMUSCULAR | Status: AC
  Administered 2020-05-22: 40 mg via INTRAVENOUS

## 2020-05-22 MED ORDER — MAGNESIUM SULFATE 2 GM/50ML IV SOLN
2.0000 g | Freq: Once | INTRAVENOUS | Status: AC
  Administered 2020-05-22: 2 g via INTRAVENOUS
  Filled 2020-05-22: qty 50

## 2020-05-22 MED ORDER — INSULIN GLARGINE 100 UNIT/ML ~~LOC~~ SOLN
23.0000 [IU] | Freq: Every day | SUBCUTANEOUS | Status: DC
  Administered 2020-05-22 – 2020-05-24 (×3): 23 [IU] via SUBCUTANEOUS
  Filled 2020-05-22 (×3): qty 0.23

## 2020-05-22 NOTE — Plan of Care (Signed)
  Problem: Education: Goal: Ability to demonstrate management of disease process will improve Outcome: Progressing Goal: Ability to verbalize understanding of medication therapies will improve Outcome: Progressing   Problem: Activity: Goal: Capacity to carry out activities will improve Outcome: Progressing   Problem: Cardiac: Goal: Ability to achieve and maintain adequate cardiopulmonary perfusion will improve Outcome: Progressing   Problem: Activity: Goal: Ability to return to baseline activity level will improve Outcome: Progressing   Problem: Cardiovascular: Goal: Ability to achieve and maintain adequate cardiovascular perfusion will improve Outcome: Progressing Goal: Vascular access site(s) Level 0-1 will be maintained Outcome: Progressing   Problem: Health Behavior/Discharge Planning: Goal: Ability to safely manage health-related needs after discharge will improve Outcome: Progressing   Problem: Clinical Measurements: Goal: Will remain free from infection Outcome: Progressing Goal: Diagnostic test results will improve Outcome: Progressing Goal: Respiratory complications will improve Outcome: Progressing   Problem: Nutrition: Goal: Adequate nutrition will be maintained Outcome: Progressing   Problem: Coping: Goal: Level of anxiety will decrease Outcome: Progressing

## 2020-05-22 NOTE — Progress Notes (Signed)
Inpatient Diabetes Program Recommendations  AACE/ADA: New Consensus Statement on Inpatient Glycemic Control (2015)  Target Ranges:  Prepandial:   less than 140 mg/dL      Peak postprandial:   less than 180 mg/dL (1-2 hours)      Critically ill patients:  140 - 180 mg/dL   Lab Results  Component Value Date   GLUCAP 195 (H) 05/22/2020   HGBA1C 11.3 (H) 02/26/2020    Review of Glycemic Control Results for KONG, PACKETT (MRN [id number]) as of 05/22/2020 10:12  Ref. Range 05/21/2020 05:40 05/21/2020 11:43 05/21/2020 13:31 05/21/2020 18:12 05/21/2020 21:37 05/22/2020 06:06 05/22/2020 09:56  Glucose-Capillary Latest Ref Range: 70 - 99 mg/dL 497 (H) 026 (H) 378 (H) 237 (H) 233 (H) 179 (H) 195 (H)   Diabetes history: DM 2 Outpatient Diabetes medications:  Metformin 500 mg bid Current orders for Inpatient glycemic control: Lantus 23 units  Novolog moderate tid with meals and HS  A1c 11.3%  Inpatient Diabetes Program Recommendations:   Please increase Lantus 25 units daily.    Will follow.  If patient will need insulin at discharge will need additional teaching.    Thanks,  Christena Deem RN, MSN, BC-ADM Inpatient Diabetes Coordinator Team Pager [phone] (8a-5p)

## 2020-05-22 NOTE — Progress Notes (Signed)
VAD Coordinator met with patient at bedside. Pt lying in bed, very quiet this am. He does report that he and LaShonda reviewed the VAD teaching material and discussed VAD evaluation. He agrees to evaluation during his stay. VAD evaluation consent reviewed and signed by patient; caregiver not at bedside this am.  VAD teaching continued with pt.   Reviewed the tests, labs, and consults the patient will be asked to complete including: chest, abdomen, pelvis CT, pulmonary function test, venous duplex, carotid doppler, ankle brachial index, and all the lab testing that we perform for VAD evaluation. Also, he will meet with Social Worker (again), Palliative Care, and Nutrition.  Reviewed and supplied a copy of home inspection check list stressing that only three pronged grounded power outlets can be used for VAD equipment. Patient confirmed home has electrical outlets that will support the equipment along with access working telephone.  Pt asked if he could get "counseling" because he feels so "overwhelmed". Contacted Raquel Sarna, LCSW and ask her to speak with patient. Pt verbalizes understanding of plan.    The patient understands that from this discussion it does not mean that they will receive the device, but that depends on an extensive evaluation process. The patient is aware of the fact that if at anytime they want to stop the evaluation process they can.  All questions have been answered at this time and contact information was provided should he encounter any further questions.  He is agreeable at this time to the evaluation process and will move forward.    [name], RN VAD Coordinator   Office: [phone] 24/7 VAD Pager: [phone]

## 2020-05-22 NOTE — Progress Notes (Signed)
CSW met at bedside with patient who shared feelings about being overwhelmed with diagnosis. Patient shared concerns and states he is "fearful of death." CSW discussed diagnosis, coping skills and working on the 3 suggestions for improved health shared by MD (losing weight, stop smoking and management of DM). CSW and patient discussed next steps and coping skills and patient will follow up with his SO LaShonda. Patient grateful for CSW support and states "I don't have anyone to talk to and this is helpful". CSW provided supportive intervention and will continue to follow for supportive needs. Raquel Sarna, [place], [place]

## 2020-05-22 NOTE — Progress Notes (Addendum)
Inpatient Diabetes Program Recommendations  AACE/ADA: New Consensus Statement on Inpatient Glycemic Control (2015)  Target Ranges:  Prepandial:   less than 140 mg/dL      Peak postprandial:   less than 180 mg/dL (1-2 hours)      Critically ill patients:  140 - 180 mg/dL   Lab Results  Component Value Date   GLUCAP 195 (H) 05/22/2020   HGBA1C 11.3 (H) 02/26/2020    Review of Glycemic Control Results for MANFORD, SPRONG (MRN [id number]) as of 05/22/2020 15:33  Ref. Range 05/21/2020 13:31 05/21/2020 18:12 05/21/2020 21:37 05/22/2020 06:06 05/22/2020 09:56  Glucose-Capillary Latest Ref Range: 70 - 99 mg/dL 627 (H) 035 (H) 009 (H) 179 (H) 195 (H)  Diabetes history: DM 2 Outpatient Diabetes medications:  Metformin 500 mg bid Current orders for Inpatient glycemic control: Lantus 23 units  Novolog moderate tid with meals and HS  Inpatient Diabetes Program Recommendations:    Spoke with patient again today regarding importance of glycemic control  Discussed goal blood sugars and A1C.  Provided emotional support as well for patient as he discussed his current diagnosis and situation.  Patient states he is agreeable to taking insulin and checking blood sugars.   Educated patient on insulin pen use at home.  Reviewed all steps of insulin pen including attachment of needle, 2-unit air shot, dialing up dose, giving injection, removing needle, disposal of sharps, storage of unused insulin, disposal of insulin etc.  Patient seemed to prefer insulin pen.  May possibly be able to be on Lantus and Metformin at d/c for glycemic control so that he only needs once shot a day?? Also discussed signs/symptoms of hypoglycemia/ treatment for hypoglycemia treatment.    Will need Rx. For:  Investment banker, corporate (Order # [id number]) Insulin pen needles (Order # [id number]) CBG meter/supplies (Order # [id number])  Thanks,  Beryl Meager, RN, BC-ADM Inpatient Diabetes Coordinator Pager [phone] (8a-5p)

## 2020-05-22 NOTE — Progress Notes (Signed)
1 Day Post-Op Procedure(s) (LRB): RIGHT/LEFT HEART CATH AND CORONARY ANGIOGRAPHY (N/A) Subjective:  On milrinone 0.125 with Co-ox improved to 67%.   No chest pain or shortness of breath.  Tearful and anxious about his situation.  Objective: Vital signs in last 24 hours: Temp:  [97.6 F (36.4 C)-98.7 F (37.1 C)] 98.7 F (37.1 C) (10/08 0727) Pulse Rate:  [50-73] 64 (10/08 1109) Cardiac Rhythm: Normal sinus rhythm (10/08 0814) Resp:  [13-22] 18 (10/08 0814) BP: (90-122)/(60-86) 99/68 (10/08 0727) SpO2:  [92 %-99 %] 96 % (10/08 0814)  Hemodynamic parameters for last 24 hours: CVP:  [1 mmHg-9 mmHg] 6 mmHg  Intake/Output from previous day: 10/07 0701 - 10/08 0700 In: 240 [P.O.:240] Out: 1650 [Urine:1650] Intake/Output this shift: No intake/output data recorded.  General appearance: alert but not very engaged in conversation Heart: regular rate and rhythm Lungs: clear to auscultation bilaterally  Lab Results: Recent Labs    05/21/20 0809 05/21/20 1037  WBC  --  8.6  HGB 14.6  15.0 16.0  HCT 43.0  44.0 47.8  PLT  --  187   BMET:  Recent Labs    05/21/20 0557 05/21/20 0557 05/21/20 0809 05/21/20 1037 05/22/20 0415  NA 135   < > 138  138  --  136  K 4.0   < > 4.2  4.3  --  4.2  CL 102  --   --   --  102  CO2 22  --   --   --  26  GLUCOSE 250*  --   --   --  225*  BUN 17  --   --   --  12  CREATININE 1.05   < >  --  1.15 1.08  CALCIUM 9.2  --   --   --  8.9   < > = values in this interval not displayed.    PT/INR:  Recent Labs    05/22/20 0415  LABPROT 13.5  INR 1.1   ABG    Component Value Date/Time   HCO3 27.2 05/21/2020 0809   HCO3 26.5 05/21/2020 0809   TCO2 29 05/21/2020 0809   TCO2 28 05/21/2020 0809   O2SAT 67.1 05/22/2020 0415   CBG (last 3)  Recent Labs    05/21/20 2137 05/22/20 0606 05/22/20 0956  GLUCAP 233* 179* 195*    Patient Name:  Peter Russo Date of Exam: 05/21/2020  Medical Rec #: [id number]     Height:     67.0 in  Accession #:  [id number]    Weight:    259.0 lb  Date of Birth: 1976-06-02     BSA:     2.257 m  Patient Age:  44 years     BP:      127/84 mmHg  Patient Gender: M         HR:      59 bpm.  Exam Location: Inpatient   Procedure: 2D Echo, Cardiac Doppler, Color Doppler and Intracardiac       Opacification Agent   Indications:  CHF-Acute Systolic 428.21 / I50.21    History:    Patient has prior history of Echocardiogram examinations,  most         recent 02/26/2020. CHF and Cardiomyopathy, CAD; Risk         Factors:Current Smoker.    Sonographer:  Eulah Pont RDCS  Referring Phys: [id number] MCLEAN   IMPRESSIONS    1. Left ventricular ejection fraction, by estimation,  is 20 to 25%. The  left ventricle has severely decreased function. The left ventricle  demonstrates global hypokinesis. The left ventricular internal cavity size  was severely dilated. No LV thrombus  noted. Left ventricular diastolic parameters are consistent with Grade II  diastolic dysfunction (pseudonormalization).  2. Right ventricular systolic function is mildly reduced. The right  ventricular size is normal. There is normal pulmonary artery systolic  pressure. The estimated right ventricular systolic pressure is 30.5 mmHg.  3. Left atrial size was mildly dilated.  4. The mitral valve is normal in structure. Trivial mitral valve  regurgitation. No evidence of mitral stenosis.  5. The aortic valve is tricuspid. Aortic valve regurgitation is not  visualized. No aortic stenosis is present.  6. The inferior vena cava is normal in size with greater than 50%  respiratory variability, suggesting right atrial pressure of 3 mmHg.   FINDINGS  Left Ventricle: Left ventricular ejection fraction, by estimation, is 20  to 25%. The left ventricle has severely decreased function. The left  ventricle demonstrates global  hypokinesis. The left ventricular internal  cavity size was severely dilated.  There is no left ventricular hypertrophy. Left ventricular diastolic  parameters are consistent with Grade II diastolic dysfunction  (pseudonormalization).   Right Ventricle: The right ventricular size is normal. No increase in  right ventricular wall thickness. Right ventricular systolic function is  mildly reduced. There is normal pulmonary artery systolic pressure. The  tricuspid regurgitant velocity is 2.62  m/s, and with an assumed right atrial pressure of 3 mmHg, the estimated  right ventricular systolic pressure is 30.5 mmHg.   Left Atrium: Left atrial size was mildly dilated.   Right Atrium: Right atrial size was normal in size.   Pericardium: There is no evidence of pericardial effusion.   Mitral Valve: The mitral valve is normal in structure. Trivial mitral  valve regurgitation. No evidence of mitral valve stenosis.   Tricuspid Valve: The tricuspid valve is normal in structure. Tricuspid  valve regurgitation is not demonstrated.   Aortic Valve: The aortic valve is tricuspid. Aortic valve regurgitation is  not visualized. No aortic stenosis is present.   Pulmonic Valve: The pulmonic valve was normal in structure. Pulmonic valve  regurgitation is not visualized.   Aorta: The aortic root is normal in size and structure.   Venous: The inferior vena cava is normal in size with greater than 50%  respiratory variability, suggesting right atrial pressure of 3 mmHg.   IAS/Shunts: No atrial level shunt detected by color flow Doppler.     LEFT VENTRICLE  PLAX 2D  LVIDd:     7.40 cm Diastology  LVIDs:     7.00 cm LV e' medial:  2.83 cm/s  LV PW:     0.70 cm LV E/e' medial: 30.4  LV IVS:    0.80 cm LV e' lateral:  4.78 cm/s  LVOT diam:   2.10 cm LV E/e' lateral: 18.0  LV SV:     44  LV SV Index:  20  LVOT Area:   3.46 cm     RIGHT VENTRICLE  RV S prime:    12.50 cm/s  TAPSE (M-mode): 1.9 cm   LEFT ATRIUM       Index    RIGHT ATRIUM      Index  LA diam:    4.40 cm 1.95 cm/m RA Area:   12.10 cm  LA Vol (A2C):  55.7 ml 24.68 ml/m RA Volume:  21.80 ml 9.66 ml/m  LA Vol (A4C):  46.3 ml 20.51 ml/m  LA Biplane Vol: 54.6 ml 24.19 ml/m  AORTIC VALVE  LVOT Vmax:  68.10 cm/s  LVOT Vmean: 47.600 cm/s  LVOT VTI:  0.128 m    AORTA  Ao Root diam: 2.60 cm  Ao Asc diam: 2.80 cm   MITRAL VALVE        TRICUSPID VALVE  MV Area (PHT): 3.27 cm  TR Peak grad:  27.5 mmHg  MV Decel Time: 232 msec  TR Vmax:    262.00 cm/s  MV E velocity: 85.90 cm/s  MV A velocity: 43.70 cm/s SHUNTS  MV E/A ratio: 1.97    Systemic VTI: 0.13 m               Systemic Diam: 2.10 cm   Marca Ancona MD  Electronically signed by Marca Ancona MD  Signature Date/Time: 05/21/2020/6:01:15 PM    Assessment/Plan:  Ischemic cardiomyopathy with acute on chronic systolic heart failure. EF on echo yesterday 20-25% with severely dilated LV 7.4 cm. Mild RV dysfunction, no significant AI, MR or TR. I think LVAD is his only option at this point and workup is in progress. He does not have any further questions at this time but is very anxious about his prognosis.   LOS: 1 day    Alleen Borne 05/22/2020

## 2020-05-22 NOTE — TOC Progression Note (Signed)
Transition of Care [facility]) - Progression Note    Patient Details  Name: Peter Russo MRN: [id number] Date of Birth: 1976-04-28  [facility]) CM/SW Contact  Leone Haven, RN Phone Number: 05/22/2020, 2:07 PM  Clinical Narrative:    NCM spoke with Alvino Chapel with [employer] they will bring the vest tonight or tomorrow morning.          Expected Discharge Plan and Services                                                 Social Determinants of Health (SDOH) Interventions    Readmission Risk Interventions No flowsheet data found.

## 2020-05-22 NOTE — Progress Notes (Signed)
Pt bp 80-90's/60's, was 110-120's/70-80's after starting milrinone IV.  Pulse 50-70's.  CVP 6-8.  Dr. Deforest Hoyles text paged and made aware of findings.  No orders received.  Will continue to monitor.

## 2020-05-22 NOTE — Progress Notes (Signed)
Initial Nutrition Assessment  DOCUMENTATION CODES:   Morbid obesity  INTERVENTION:   - ProSource Plus 30 ml po BID, each supplement provides 100 kcal and 15 grams of protein  - RD will follow for diet advancement and adjust supplement regimen as appropriate  - Diabetes diet education and handout provided, will continue to follow up with pt regarding education needs  NUTRITION DIAGNOSIS:   Increased nutrient needs related to chronic illness (CHF) as evidenced by estimated needs.  GOAL:   Patient will meet greater than or equal to 90% of their needs  MONITOR:   PO intake, Supplement acceptance, Diet advancement, Labs, Weight trends, I & O's  REASON FOR ASSESSMENT:   Consult Diet education  ASSESSMENT:   44 year old male with PMH of CAD s/p multiple MIs and PCIs, ischemic cardiomyopathy, HTN, active smoking, and poorly controlled DM. Coronary angiography showed occluded RCA (known from past) and occluded pLAD w/ minimal collaterals. RHC demonstrated elevated PCWP, mild pulmonary venous hypertension and low cardiac output. He was directly admitted from cath lab for management of CHF with low output and surgical consultation for potential CABG vs LVAD.   Pt is a possible LVAD candidate. Per notes, CABG and transplant not an option. LVAD work-up ongoing.  RD consulted for nutrition education regarding diabetes as well as possible LVAD.  Spoke with pt at bedside. Pt reports that he was diagnosed with DM back in July. After this diagnosis, pt began eating less fried foods and also cut out regular sodas from his diet. Pt reports that he typically "eats like a normal person does." He has been trying to eat more healthfully but reports that having a teenage child in the house makes things more difficult. Pt states that he has still been drinking orange juice.  RD provided "Carbohydrate Counting for People with Diabetes" handout from the Academy of Nutrition and Dietetics. Discussed  different food groups and their effects on blood sugar, emphasizing carbohydrate-containing foods. Provided list of carbohydrates and recommended serving sizes of common foods.  Discussed importance of controlled and consistent carbohydrate intake throughout the day. Provided examples of ways to balance meals/snacks and encouraged intake of high-fiber, whole grain complex carbohydrates. Teach back method used.  Expect good compliance. Pt emotional and tearful on and off throughout visit. Suspect pt will require additional education once feeling less overwhelmed. RD will continue follow pt during admission for diet education.  Reviewed weight history in chart. Pt with a 9.5 kg weight loss since January 2021. This is a 7.5% weight loss in 10 months which is not significant for timeframe.  Meal Completion: 75% x 1 meal on 10/07  Medications reviewed and include: SSI, lantus 23 units daily, klor-con, spironolactone, milrinone  Labs reviewed. CBG's: 179-237  UOP: 1650 ml x 24 hours I/O's: -1.4 L since admit  NUTRITION - FOCUSED PHYSICAL EXAM:    Most Recent Value  Orbital Region No depletion  Upper Arm Region No depletion  Thoracic and Lumbar Region No depletion  Buccal Region No depletion  Temple Region No depletion  Clavicle Bone Region No depletion  Clavicle and Acromion Bone Region No depletion  Scapular Bone Region No depletion  Dorsal Hand No depletion  Patellar Region No depletion  Anterior Thigh Region No depletion  Posterior Calf Region No depletion  Edema (RD Assessment) None  Hair Reviewed  Eyes Reviewed  Mouth Reviewed  Skin Reviewed  Nails Reviewed       Diet Order:   Diet Order  Diet clear liquid Room service appropriate? Yes; Fluid consistency: Thin  Diet effective now                 EDUCATION NEEDS:   Education needs have been addressed  Skin:  Skin Assessment: Reviewed RN Assessment  Last BM:  05/21/20  Height:   Ht Readings from  Last 1 Encounters:  05/21/20 5\' 7"  (1.702 m)    Weight:   Wt Readings from Last 1 Encounters:  05/21/20 117.5 kg    BMI:  Body mass index is 40.57 kg/m.  Estimated Nutritional Needs:   Kcal:  2300-2500  Protein:  115-130 grams  Fluid:  1.8 L/day    07/21/20, MS, RD, LDN Inpatient Clinical Dietitian Please see AMiON for contact information.

## 2020-05-22 NOTE — Progress Notes (Addendum)
Advanced Heart Failure Rounding Note  PCP-Cardiologist: Charlton Haws, MD   Subjective:    On milrinone 0.125. Co-ox 67%.  Was ordered to get 1x dose of IV lasix yesterday but never given. CVP 10 today. SBP 110s currently. Apprarently Coreg and Entresto held last PM for low BP.    Echo yesterday showed LVEF 20-25%, GIIDD, mildly reduced RV Evaluated by CT surgery yesterday. CABG and transplant not an option.  Getting VAD w/u.   He has no complaints currently. Laying in bed. No dyspnea or CP.    Objective:   Weight Range: 117.5 kg Body mass index is 40.57 kg/m.   Vital Signs:   Temp:  [97.6 F (36.4 C)-98.7 F (37.1 C)] 98.7 F (37.1 C) (10/08 0727) Pulse Rate:  [50-73] 65 (10/08 0814) Resp:  [13-22] 18 (10/08 0814) BP: (90-127)/(60-86) 99/68 (10/08 0727) SpO2:  [92 %-99 %] 96 % (10/08 0814) Last BM Date: 05/22/20  Weight change: Filed Weights   05/21/20 0534  Weight: 117.5 kg    Intake/Output:   Intake/Output Summary (Last 24 hours) at 05/22/2020 0943 Last data filed at 05/21/2020 1907 Gross per 24 hour  Intake 240 ml  Output 1650 ml  Net -1410 ml      Physical Exam    General:  Moderately obese, well appearing. No resp difficulty HEENT: Normal Neck: Supple. JVP ~10 cm. Carotids 2+ bilat; no bruits. No lymphadenopathy or thyromegaly appreciated. Cor: PMI nondisplaced. Regular rate & rhythm. No rubs, gallops or murmurs. Lungs: Clear Abdomen: Soft, nontender, nondistended. No hepatosplenomegaly. No bruits or masses. Good bowel sounds. Extremities: No cyanosis, clubbing, rash, edema Neuro: Alert & orientedx3, cranial nerves grossly intact. moves all 4 extremities w/o difficulty. Affect pleasant   Telemetry   NSR mid 60s   EKG    No new EKG to review   Labs    CBC Recent Labs    05/21/20 0809 05/21/20 1037  WBC  --  8.6  NEUTROABS  --  5.4  HGB 14.6  15.0 16.0  HCT 43.0  44.0 47.8  MCV  --  87.5  PLT  --  187   Basic Metabolic  Panel Recent Labs    05/21/20 0557 05/21/20 0557 05/21/20 0809 05/21/20 1037 05/22/20 0415  NA 135   < > 138  138  --  136  K 4.0   < > 4.2  4.3  --  4.2  CL 102  --   --   --  102  CO2 22  --   --   --  26  GLUCOSE 250*  --   --   --  225*  BUN 17  --   --   --  12  CREATININE 1.05   < >  --  1.15 1.08  CALCIUM 9.2  --   --   --  8.9  MG  --   --   --   --  1.8   < > = values in this interval not displayed.   Liver Function Tests Recent Labs    05/21/20 1037  AST 20  ALT 26  ALKPHOS 74  BILITOT 1.0  PROT 7.1  ALBUMIN 3.5   No results for input(s): LIPASE, AMYLASE in the last 72 hours. Cardiac Enzymes No results for input(s): CKTOTAL, CKMB, CKMBINDEX, TROPONINI in the last 72 hours.  BNP: BNP (last 3 results) Recent Labs    05/21/20 1037  BNP 161.1*    ProBNP (last 3 results)  No results for input(s): PROBNP in the last 8760 hours.   D-Dimer No results for input(s): DDIMER in the last 72 hours. Hemoglobin A1C No results for input(s): HGBA1C in the last 72 hours. Fasting Lipid Panel No results for input(s): CHOL, HDL, LDLCALC, TRIG, CHOLHDL, LDLDIRECT in the last 72 hours. Thyroid Function Tests Recent Labs    05/21/20 1037  TSH 1.307    Other results:   Imaging    ECHOCARDIOGRAM COMPLETE  Result Date: 05/21/2020    ECHOCARDIOGRAM REPORT   Patient Name:   Peter Russo Date of Exam: 05/21/2020 Medical Rec #:  [id number]         Height:       67.0 in Accession #:    [id number]        Weight:       259.0 lb Date of Birth:  [birth year]         BSA:          2.257 m Patient Age:    44 years          BP:           127/84 mmHg Patient Gender: M                 HR:           59 bpm. Exam Location:  Inpatient Procedure: 2D Echo, Cardiac Doppler, Color Doppler and Intracardiac            Opacification Agent Indications:    CHF-Acute Systolic 428.21 / I50.21  History:        Patient has prior history of Echocardiogram examinations, most                 recent  02/26/2020. CHF and Cardiomyopathy, CAD; Risk                 Factors:Current Smoker.  Sonographer:    Eulah PontSarah Pirrotta RDCS Referring Phys: [id number] Eliot FordALTON S MCLEAN IMPRESSIONS  1. Left ventricular ejection fraction, by estimation, is 20 to 25%. The left ventricle has severely decreased function. The left ventricle demonstrates global hypokinesis. The left ventricular internal cavity size was severely dilated. No LV thrombus noted. Left ventricular diastolic parameters are consistent with Grade II diastolic dysfunction (pseudonormalization).  2. Right ventricular systolic function is mildly reduced. The right ventricular size is normal. There is normal pulmonary artery systolic pressure. The estimated right ventricular systolic pressure is 30.5 mmHg.  3. Left atrial size was mildly dilated.  4. The mitral valve is normal in structure. Trivial mitral valve regurgitation. No evidence of mitral stenosis.  5. The aortic valve is tricuspid. Aortic valve regurgitation is not visualized. No aortic stenosis is present.  6. The inferior vena cava is normal in size with greater than 50% respiratory variability, suggesting right atrial pressure of 3 mmHg. FINDINGS  Left Ventricle: Left ventricular ejection fraction, by estimation, is 20 to 25%. The left ventricle has severely decreased function. The left ventricle demonstrates global hypokinesis. The left ventricular internal cavity size was severely dilated. There is no left ventricular hypertrophy. Left ventricular diastolic parameters are consistent with Grade II diastolic dysfunction (pseudonormalization). Right Ventricle: The right ventricular size is normal. No increase in right ventricular wall thickness. Right ventricular systolic function is mildly reduced. There is normal pulmonary artery systolic pressure. The tricuspid regurgitant velocity is 2.62 m/s, and with an assumed right atrial pressure of 3 mmHg, the estimated right ventricular systolic pressure is 30.5 mmHg.  Left Atrium:  Left atrial size was mildly dilated. Right Atrium: Right atrial size was normal in size. Pericardium: There is no evidence of pericardial effusion. Mitral Valve: The mitral valve is normal in structure. Trivial mitral valve regurgitation. No evidence of mitral valve stenosis. Tricuspid Valve: The tricuspid valve is normal in structure. Tricuspid valve regurgitation is not demonstrated. Aortic Valve: The aortic valve is tricuspid. Aortic valve regurgitation is not visualized. No aortic stenosis is present. Pulmonic Valve: The pulmonic valve was normal in structure. Pulmonic valve regurgitation is not visualized. Aorta: The aortic root is normal in size and structure. Venous: The inferior vena cava is normal in size with greater than 50% respiratory variability, suggesting right atrial pressure of 3 mmHg. IAS/Shunts: No atrial level shunt detected by color flow Doppler.  LEFT VENTRICLE PLAX 2D LVIDd:         7.40 cm  Diastology LVIDs:         7.00 cm  LV e' medial:    2.83 cm/s LV PW:         0.70 cm  LV E/e' medial:  30.4 LV IVS:        0.80 cm  LV e' lateral:   4.78 cm/s LVOT diam:     2.10 cm  LV E/e' lateral: 18.0 LV SV:         44 LV SV Index:   20 LVOT Area:     3.46 cm  RIGHT VENTRICLE RV S prime:     12.50 cm/s TAPSE (M-mode): 1.9 cm LEFT ATRIUM             Index       RIGHT ATRIUM           Index LA diam:        4.40 cm 1.95 cm/m  RA Area:     12.10 cm LA Vol (A2C):   55.7 ml 24.68 ml/m RA Volume:   21.80 ml  9.66 ml/m LA Vol (A4C):   46.3 ml 20.51 ml/m LA Biplane Vol: 54.6 ml 24.19 ml/m  AORTIC VALVE LVOT Vmax:   68.10 cm/s LVOT Vmean:  47.600 cm/s LVOT VTI:    0.128 m  AORTA Ao Root diam: 2.60 cm Ao Asc diam:  2.80 cm MITRAL VALVE               TRICUSPID VALVE MV Area (PHT): 3.27 cm    TR Peak grad:   27.5 mmHg MV Decel Time: 232 msec    TR Vmax:        262.00 cm/s MV E velocity: 85.90 cm/s MV A velocity: 43.70 cm/s  SHUNTS MV E/A ratio:  1.97        Systemic VTI:  0.13 m                             Systemic Diam: 2.10 cm Marca Ancona MD Electronically signed by Marca Ancona MD Signature Date/Time: 05/21/2020/6:01:15 PM    Final       Medications:     Scheduled Medications: . aspirin EC  81 mg Oral Daily  . atorvastatin  80 mg Oral q1800  . buPROPion  150 mg Oral Daily  . carvedilol  6.25 mg Oral BID  . Chlorhexidine Gluconate Cloth  6 each Topical Daily  . digoxin  0.125 mg Oral Daily  . enoxaparin (LOVENOX) injection  40 mg Subcutaneous Daily  . furosemide  40 mg Intravenous Once  . furosemide  40 mg Oral Daily  . insulin aspart  0-15 Units Subcutaneous TID WC  . insulin aspart  0-5 Units Subcutaneous QHS  . insulin glargine  20 Units Subcutaneous Daily  . potassium chloride  40 mEq Oral Daily  . sacubitril-valsartan  1 tablet Oral BID  . sodium chloride flush  10-40 mL Intracatheter Q12H  . sodium chloride flush  3 mL Intravenous Q12H  . sodium chloride flush  3 mL Intravenous Q12H  . sodium chloride flush  3 mL Intravenous Q12H  . spironolactone  25 mg Oral Daily     Infusions: . sodium chloride    . sodium chloride 10 mL/hr at 05/21/20 0557  . sodium chloride    . sodium chloride    . milrinone 0.125 mcg/kg/min (05/21/20 1918)     PRN Medications:  sodium chloride, sodium chloride, sodium chloride, acetaminophen, antiseptic oral rinse, nitroGLYCERIN, ondansetron (ZOFRAN) IV, sodium chloride flush, sodium chloride flush, sodium chloride flush, sodium chloride flush, triamcinolone cream   Assessment/Plan   1. CAD: RCA known to be chronically occluded. He has had multiple PCIs to LAD. Repeat LHC this admit done for recurrent angina demonstrated known chronic RCA occlusion as well as occlusion of the proximal LAD with minimal collaterals and no significant distal vessel present.  The left circumflex is patent. Per Dr. Lavinia Sharps, there is no role for revascularization. Being considered for LVAD.  - Hold Brilinta, in anticipation of potential surgery   - Continue ASA 81  - Continue Atorvastatin 80  2. Acute on Chronic systolic CHF w/ Low Output: Ischemic cardiomyopathy. Last echo in 7/21 with EF 25-30%. NYHA class II-III symptoms. RHC this admit demonstrated elevated PCWP, mild pulmonary venous hypertension and low cardiac output. CI 1.4. Started on milrinone 0.125. Initial co-ox on milrinone 57%. Co-ox up to 67% today. Echo yesterday showed EF 20-25%, mildly reduced RV. Not a candidate for cardiac transplant w/ poorly controlled DM and active tobacco use. He is being evaluated for LVAD but he needs to get his diabetes under much better control and completely abstain from smoking prior to doing that - continue milrinone 0.125 mcg/kg/min  - CVP 10. Give IV Lasix 40 mg x 1  - Continue Entresto 49-51 bid - Hold Coreg for low w/ low BP/ output  - Continue Spiro 25 daily  - continue digoxin 0.125 daily  - Hgb A1c pending. If < 10, start Farxiga.  - He is not a CRT candidate. Will arrange LifeVest at d/c for bridge possible LVAD.   3. Type 2 diabetes: New diagnosis in 7/21 with hgbA1c 11.3. He was metformin PTA but has not seen his PCP since diagnosis was made. He will need much better control prior to VAD  - Repeat HgbA1c pending - If glucose is not out of control, will need to start [place] eventually for CHF.  - holding metformin post cath - Lantus 25 units ordered per DM coordinator, appreciate their assistance  - will refer to outpatient endocrinology  - continue statin  4. HTN: BP is controlled.  5. OSA: CPAP QHS  6. Smoking: 1-2 cigs/day. Smoking cessation strongly advised. - Continue Wellbutrin 7. HLD w/ LDL Goal < 70 - LDL elevated on recent lipid panel at 175 mg/dL - continue atorvastatin 80 qhs - plan referral to lipid clinic post d/c for PCSK9i    Length of Stay: 1  Brittainy Simmons, PA-C  05/22/2020, 9:43 AM  Advanced Heart Failure Team Pager [phone] (M-F; 7a - 4p)  Please contact [facility] for night-coverage  after hours (4p -7a ) and weekends on [email or web address]  Patient seen and examined with the above-signed Advanced Practice Provider and/or Housestaff. I personally reviewed laboratory data, imaging studies and relevant notes. I independently examined the patient and formulated the important aspects of the plan. I have edited the note to reflect any of my changes or salient points. I have personally discussed the plan with the patient and/or family.  Remains on milrinone 0.125. Co-ox 67%. CVP 10-11. Diuresing. Feeling better but remains depressed. Had good talk with SW today  General:  Sitting in chair  No resp difficulty HEENT: normal Neck: supple. JVP to haw . Carotids 2+ bilat; no bruits. No lymphadenopathy or thryomegaly appreciated. Cor: PMI nondisplaced. Regular rate & rhythm. +s3 Lungs: clear Abdomen: obese soft, nontender, nondistended. No hepatosplenomegaly. No bruits or masses. Good bowel sounds. Extremities: no cyanosis, clubbing, rash, 1+ edema Neuro: alert & orientedx3, cranial nerves grossly intact. moves all 4 extremities w/o difficulty. Affect pleasant  Improved with milrinone. VAD w/u underway. D/w TCTS and SW. Continue milrinone and IV lasix for now. PFTs on Monday. LifeVest ordered.  Arvilla Meres, MD  6:15 PM

## 2020-05-23 DIAGNOSIS — I2510 Atherosclerotic heart disease of native coronary artery without angina pectoris: Secondary | ICD-10-CM | POA: Diagnosis not present

## 2020-05-23 DIAGNOSIS — I5043 Acute on chronic combined systolic (congestive) and diastolic (congestive) heart failure: Secondary | ICD-10-CM | POA: Diagnosis not present

## 2020-05-23 DIAGNOSIS — Z0000 Encounter for general adult medical examination without abnormal findings: Secondary | ICD-10-CM

## 2020-05-23 DIAGNOSIS — Z515 Encounter for palliative care: Secondary | ICD-10-CM

## 2020-05-23 DIAGNOSIS — Z7189 Other specified counseling: Secondary | ICD-10-CM

## 2020-05-23 LAB — BASIC METABOLIC PANEL
Anion gap: 10 (ref 5–15)
BUN: 18 mg/dL (ref 6–20)
CO2: 26 mmol/L (ref 22–32)
Calcium: 8.6 mg/dL — ABNORMAL LOW (ref 8.9–10.3)
Chloride: 100 mmol/L (ref 98–111)
Creatinine, Ser: 1.14 mg/dL (ref 0.61–1.24)
GFR, Estimated: >60 mL/min (ref >60)
Glucose, Bld: 180 mg/dL — ABNORMAL HIGH (ref 70–99)
Potassium: 3.7 mmol/L (ref 3.5–5.1)
Sodium: 136 mmol/L (ref 135–145)

## 2020-05-23 LAB — HEMOGLOBIN A1C
Hgb A1c MFr Bld: 12.2 % — ABNORMAL HIGH (ref 4.8–5.6)
Mean Plasma Glucose: 303 mg/dL

## 2020-05-23 LAB — GLUCOSE, CAPILLARY
Glucose-Capillary: 175 mg/dL — ABNORMAL HIGH (ref 70–99)
Glucose-Capillary: 196 mg/dL — ABNORMAL HIGH (ref 70–99)
Glucose-Capillary: 192 mg/dL — ABNORMAL HIGH (ref 70–99)
Glucose-Capillary: 186 mg/dL — ABNORMAL HIGH (ref 70–99)

## 2020-05-23 LAB — LUPUS ANTICOAGULANT PANEL
DRVVT: 37.9 s (ref 0.0–47.0)
PTT Lupus Anticoagulant: 32.3 s (ref 0.0–51.9)

## 2020-05-23 LAB — COOXEMETRY PANEL
Carboxyhemoglobin: 1.5 % (ref 0.5–1.5)
Methemoglobin: 1.2 % (ref 0.0–1.5)
O2 Saturation: 63.6 %
Total hemoglobin: 15 g/dL (ref 12.0–16.0)

## 2020-05-23 MED ORDER — FUROSEMIDE 40 MG PO TABS
40.0000 mg | ORAL_TABLET | Freq: Every day | ORAL | Status: DC
  Administered 2020-05-23 – 2020-05-24 (×2): 40 mg via ORAL
  Filled 2020-05-23 (×3): qty 1

## 2020-05-23 MED ORDER — SACUBITRIL-VALSARTAN 24-26 MG PO TABS
1.0000 | ORAL_TABLET | Freq: Two times a day (BID) | ORAL | Status: DC
  Administered 2020-05-23 – 2020-05-24 (×3): 1 via ORAL
  Filled 2020-05-23 (×3): qty 1

## 2020-05-23 NOTE — Progress Notes (Signed)
   05/23/20 1100  Clinical Encounter Type  Visited With Patient  Visit Type Spiritual support;Social support;Initial  Referral From Nurse  Consult/Referral To Chaplain  Spiritual Encounters  Spiritual Needs Sacred text;Ritual;Emotional  The chaplain spoke directly with the patient because of a referral from the nurse Enid Derry). The chaplain spoke with the patient and provided social, emotional, and spiritual support. The patient has some life-changing health concerns and spoke to the chaplain about his fears and concerns. The chaplain will continue to follow up with the patient as needed for ongoing spiritual care.

## 2020-05-23 NOTE — Plan of Care (Signed)
  Problem: Education: Goal: Ability to demonstrate management of disease process will improve Outcome: Progressing Goal: Ability to verbalize understanding of medication therapies will improve Outcome: Progressing Goal: Individualized Educational Video(s) Outcome: Progressing   Problem: Activity: Goal: Capacity to carry out activities will improve Outcome: Progressing   Problem: Cardiac: Goal: Ability to achieve and maintain adequate cardiopulmonary perfusion will improve Outcome: Progressing   Problem: Education: Goal: Understanding of CV disease, CV risk reduction, and recovery process will improve Outcome: Progressing   Problem: Activity: Goal: Ability to return to baseline activity level will improve Outcome: Progressing   Problem: Cardiovascular: Goal: Ability to achieve and maintain adequate cardiovascular perfusion will improve Outcome: Progressing Goal: Vascular access site(s) Level 0-1 will be maintained Outcome: Progressing   Problem: Clinical Measurements: Goal: Ability to maintain clinical measurements within normal limits will improve Outcome: Progressing Goal: Respiratory complications will improve Outcome: Progressing Goal: Cardiovascular complication will be avoided Outcome: Progressing

## 2020-05-23 NOTE — Consult Note (Signed)
Palliative Medicine Inpatient Consult Note  Reason for consult:  "VAD Evaluation"  HPI:  Per intake H&P --> 44 y.o.with history of CAD s/p multiple MIs and PCIs, ischemic cardiomyopathy, HTN, active smoking, and poorly controlled diabetes. Patient has had multiple coronary interventions, first in 2010.   Palliative care was asked to get involved to aid in LVAD evaluation.   Clinical Assessment/Goals of Care: I have reviewed medical records including EPIC notes, labs and imaging, received report from bedside RN, assessed the patient.    I met with Peter Russo to discuss advanced directives and a preparedness plan in light of possible LVAD implantation.   I introduced Palliative Medicine as specialized medical care for people living with serious illness. It focuses on providing relief from the symptoms and stress of a serious illness. The goal is to improve quality of life for both the patient and the family.  I asked Peter Russo to tell me about himself.  He shares he is from [place] he is lived here his whole life.  He has never been married though he does have a significant other [place] who he has been with for 28 years.  He has 3 children 2 daughters and 1 son.  He has not been working and has been on disability for some time now.  He shares that he is a religious man but ascribes to no specific denomination.  Theodoros endorses that at home he is able to participate in all activities of daily living inclusive of driving.  Naseer becomes incredibly tearful during our conversation when I discussed things like advanced directives power of attorney and living well I shared with him that these are important topics to discuss irregardless of what age we are.  He asked that I speak to his partner Peter Russo about this further.  I asked Arien what his goals for himself are and he shares that his goals are to get better and to go home.  I asked him what he understood from the cardiology  team about the severity of his illness.    He states that he knows he has a poorly functioning heart and this is all extremely overwhelming for him.  I offered support through therapeutic listening.  I asked him what he is afraid of.  He became more tearful and asked that we continue this conversation later in the day.  I was able to speak to [facility] with [name] on speaker phone thereafter and solidify a meeting time of 230 this afternoon.  At this time we will discuss the matters of concern in greater detail. _____________________________________________________________ Addendum:  I met with [name] and his significant other Peter Russo at bedside this afternoon.  Peter Russo's nurse Pamala Hurry was explaining to him what cardiology's notes that.  Peter Russo expressed relief that he now knows what he has to do to improve his present condition.  Selig expresses that he has been extremely tearful as he does not know what the future holds.  He states that he feels extremely encouraged in terms of smoking cessation, weight loss, and overall restructuring of a nutritional plan for his diabetes.  We discussed him meeting with our diabetic coordinator and nutritionist in-house to help him get started.  Patient was able to verbalize the importance of quality of life, he get great joy out of fishing and staying active.  Peter Russo is hopeful  That LVAD procedure will increase quality of life in terms of allowing him to continue doing the things that he loves.  At  this time patient is open to all available medical interventions to prolong life and the success of the LVAD therapy.   He shared and verbalized an understanding that  and  family would know when the burdens of treatment would outweigh the benefits and trust in family's support at that time.  I introduced the topics of advanced care planning.  I shared that everyone should have advanced care documents inclusive of a designated healthcare power of attorney and living will.  I  introduced that MOST form and recommended that he and his significant other review this and discuss what is important to them in the long term.  I shared that our chaplain will be back on Monday to help with notarizing these the documents.  Patient and family were encouraged to continue conversations regarding advanced directives and overall goals into the future as it is vital for the patient centered care.  Chaplain services provided.   Additional Time In: 1430 Time Out: 1327 Total Additional  Time: 57 Greater than 50%  of this time was spent counseling and coordinating care related to the above assessment and plan.  Decision Maker: Carmin is able to make decisions for himself.  SUMMARY OF RECOMMENDATIONS   Full Code / Full scope of Treatment  Patient has good social support with his partner, Peter Russo  Motivated to improve health situation and stop smoking - start controlling DM better  Will reach out to DM educator to help with ongoing education for DM management  Nutrition consult for education  Chaplain services requested to help work through patient fears  On discharge patient will need referral to new PCP (per request) endocrinology - diabetes multidisciplinary clinic, and smoking cessation clinic, patient interested in cardiac rehabilitation  Code Status/Advance Care Planning: FULL CODE   Palliative Prophylaxis:   Oral care, mobility  Additional Recommendations (Limitations, Scope, Preferences):  Full scope of treatment   Psycho-social/Spiritual:   Desire for further Chaplaincy support:  Yes nondenominational  Additional Recommendations:  Education on chronicity of illness.  Education on LVAD placement   Prognosis:  Prognosis is poor in the setting of severe heart failure  Discharge Planning:  Plan to discharge home once medically optimized  PPS: 60%   This conversation/these recommendations were discussed with patient primary care team, Dr. Aundra Dubin  Time  In: 0850 Time Out: 1000 Total Time: 57 Greater than 50%  of this time was spent counseling and coordinating care related to the above assessment and plan.  [place] Team Team Cell Phone: [phone] Please utilize secure chat with additional questions, if there is no response within 30 minutes please call the above phone number  Palliative Medicine Team providers are available by phone from 7am to 7pm daily and can be reached through the team cell phone.  Should this patient require assistance outside of these hours, please call the patient's attending physician.

## 2020-05-23 NOTE — Progress Notes (Addendum)
Received message that pt is requesting assistance on finding a new PCP and a referral for an endocrinologist. Met with pt and his partner. He reports that his PCP works at the [facility] and he doesn't like the center because is hard to make an appointment. He said he called a couple of doctors but they are not accepting new pts. Encouraged them to call the number on the back of his insurance card and they can help him find a provider that is in network. He verbalized understanding.

## 2020-05-23 NOTE — Progress Notes (Signed)
patient sobbing that he is so confused about everything.  States he doesn't want to die. Writer sat with patient and listened to his concerns.  After leaving room writer made a referral to [facility] and explained patient situation and concerns and possible need for another ear.  Eligah East stated he would be by soon this morning

## 2020-05-23 NOTE — Progress Notes (Signed)
Inpatient Diabetes Program Recommendations  AACE/ADA: New Consensus Statement on Inpatient Glycemic Control (2015)  Target Ranges:  Prepandial:   less than 140 mg/dL      Peak postprandial:   less than 180 mg/dL (1-2 hours)      Critically ill patients:  140 - 180 mg/dL   Lab Results  Component Value Date   GLUCAP 175 (H) 05/23/2020   HGBA1C 12.2 (H) 05/22/2020    Review of Glycemic Control Results for Peter Russo, Peter Russo (MRN [id number]) as of 05/22/2020 10:12  Ref. Range 05/21/2020 05:40 05/21/2020 11:43 05/21/2020 13:31 05/21/2020 18:12 05/21/2020 21:37 05/22/2020 06:06 05/22/2020 09:56  Glucose-Capillary Latest Ref Range: 70 - 99 mg/dL 037 (H) 048 (H) 889 (H) 237 (H) 233 (H) 179 (H) 195 (H)   Diabetes history: DM 2 Outpatient Diabetes medications:  Metformin 500 mg bid Current orders for Inpatient glycemic control: Lantus 23 units  Novolog moderate tid with meals and HS  A1c 11.3%  Inpatient Diabetes Program Recommendations:     -  Please increase Lantus 26 units daily.   -  Add Novolog 2 units tid meal coverage if eating >50% of meals.   Thanks,  Christena Deem RN, MSN, BC-ADM Inpatient Diabetes Coordinator Team Pager [phone] (8a-5p)

## 2020-05-23 NOTE — Plan of Care (Signed)
  Problem: Education: Goal: Ability to demonstrate management of disease process will improve Outcome: Progressing Goal: Ability to verbalize understanding of medication therapies will improve Outcome: Progressing Goal: Individualized Educational Video(s) Outcome: Progressing   Problem: Activity: Goal: Capacity to carry out activities will improve Outcome: Progressing   Problem: Cardiac: Goal: Ability to achieve and maintain adequate cardiopulmonary perfusion will improve Outcome: Progressing   Problem: Education: Goal: Understanding of CV disease, CV risk reduction, and recovery process will improve Outcome: Progressing Goal: Individualized Educational Video(s) Outcome: Progressing   Problem: Activity: Goal: Ability to return to baseline activity level will improve Outcome: Progressing   Problem: Cardiovascular: Goal: Ability to achieve and maintain adequate cardiovascular perfusion will improve Outcome: Progressing Goal: Vascular access site(s) Level 0-1 will be maintained Outcome: Progressing   Problem: Health Behavior/Discharge Planning: Goal: Ability to safely manage health-related needs after discharge will improve Outcome: Progressing   Problem: Education: Goal: Knowledge of General Education information will improve Description: Including pain rating scale, medication(s)/side effects and non-pharmacologic comfort measures Outcome: Progressing   Problem: Health Behavior/Discharge Planning: Goal: Ability to manage health-related needs will improve Outcome: Progressing   Problem: Clinical Measurements: Goal: Ability to maintain clinical measurements within normal limits will improve Outcome: Progressing Goal: Will remain free from infection Outcome: Progressing Goal: Diagnostic test results will improve Outcome: Progressing Goal: Respiratory complications will improve Outcome: Progressing Goal: Cardiovascular complication will be avoided Outcome:  Progressing   Problem: Activity: Goal: Risk for activity intolerance will decrease Outcome: Progressing   Problem: Nutrition: Goal: Adequate nutrition will be maintained Outcome: Progressing   Problem: Coping: Goal: Level of anxiety will decrease Outcome: Progressing   Problem: Elimination: Goal: Will not experience complications related to bowel motility Outcome: Progressing Goal: Will not experience complications related to urinary retention Outcome: Progressing   Problem: Pain Managment: Goal: General experience of comfort will improve Outcome: Progressing   Problem: Safety: Goal: Ability to remain free from injury will improve Outcome: Progressing   Problem: Skin Integrity: Goal: Risk for impaired skin integrity will decrease Outcome: Progressing   

## 2020-05-23 NOTE — Progress Notes (Signed)
Advanced Heart Failure Rounding Note  PCP-Cardiologist: Charlton Haws, MD   Subjective:    On milrinone 0.125. Co-ox 64%. Feels good. Denies CP or SOB. Frustrated.   CVP 7. SBP mostly ~ 100 but dropped to 70-80 this am (asymptoamtic)  Echo LVEF 20-25%, GIIDD, mildly reduced RV  Objective:   Weight Range: 117.5 kg Body mass index is 40.57 kg/m.   Vital Signs:   Temp:  [97.8 F (36.6 C)-98.8 F (37.1 C)] 98.6 F (37 C) (10/09 0300) Pulse Rate:  [57-84] 62 (10/09 0600) Resp:  [14-23] 17 (10/09 0600) BP: (93-115)/(53-79) 97/63 (10/09 0600) SpO2:  [93 %-98 %] 95 % (10/09 0600) Last BM Date: 05/22/20  Weight change: Filed Weights   05/21/20 0534  Weight: 117.5 kg    Intake/Output:   Intake/Output Summary (Last 24 hours) at 05/23/2020 0742 Last data filed at 05/23/2020 0600 Gross per 24 hour  Intake 752.14 ml  Output 3125 ml  Net -2372.86 ml      Physical Exam    General:  Well appearing. No resp difficulty HEENT: normal Neck: supple. no JVD. Carotids 2+ bilat; no bruits. No lymphadenopathy or thryomegaly appreciated. Cor: PMI nondisplaced. Regular rate & rhythm. No rubs, gallops or murmurs. Lungs: clear Abdomen: soft, nontender, nondistended. No hepatosplenomegaly. No bruits or masses. Good bowel sounds. Extremities: no cyanosis, clubbing, rash, edema Neuro: alert & orientedx3, cranial nerves grossly intact. moves all 4 extremities w/o difficulty. Affect pleasant   Telemetry   Sinus 60s Personally reviewed   Labs    CBC Recent Labs    05/21/20 0809 05/21/20 1037  WBC  --  8.6  NEUTROABS  --  5.4  HGB 14.6  15.0 16.0  HCT 43.0  44.0 47.8  MCV  --  87.5  PLT  --  187   Basic Metabolic Panel Recent Labs    76/73/41 0415 05/23/20 0455  NA 136 136  K 4.2 3.7  CL 102 100  CO2 26 26  GLUCOSE 225* 180*  BUN 12 18  CREATININE 1.08 1.14  CALCIUM 8.9 8.6*  MG 1.8  --    Liver Function Tests Recent Labs    05/21/20 1037  AST 20  ALT  26  ALKPHOS 74  BILITOT 1.0  PROT 7.1  ALBUMIN 3.5   No results for input(s): LIPASE, AMYLASE in the last 72 hours. Cardiac Enzymes No results for input(s): CKTOTAL, CKMB, CKMBINDEX, TROPONINI in the last 72 hours.  BNP: BNP (last 3 results) Recent Labs    05/21/20 1037  BNP 161.1*    ProBNP (last 3 results) No results for input(s): PROBNP in the last 8760 hours.   D-Dimer No results for input(s): DDIMER in the last 72 hours. Hemoglobin A1C Recent Labs    05/22/20 0415  HGBA1C 12.2*   Fasting Lipid Panel No results for input(s): CHOL, HDL, LDLCALC, TRIG, CHOLHDL, LDLDIRECT in the last 72 hours. Thyroid Function Tests Recent Labs    05/21/20 1037  TSH 1.307    Other results:   Imaging    CT ABDOMEN PELVIS WO CONTRAST  Result Date: 05/22/2020 CLINICAL DATA:  LVAD evaluation EXAM: CT CHEST, ABDOMEN AND PELVIS WITHOUT CONTRAST TECHNIQUE: Multidetector CT imaging of the chest, abdomen and pelvis was performed following the standard protocol without IV contrast. Oral enteric contrast was administered. COMPARISON:  CT abdomen pelvis, 07/28/2009 FINDINGS: CT CHEST FINDINGS Cardiovascular: Minimal aortic atherosclerosis. Mild cardiomegaly. Three-vessel coronary artery stents. No pericardial effusion. Right upper extremity PICC, tip positioned in  the inferior superior vena cava. Mediastinum/Nodes: No enlarged mediastinal, hilar, or axillary lymph nodes. Thyroid gland, trachea, and esophagus demonstrate no significant findings. Lungs/Pleura: Minimal bandlike atelectasis or scarring of the dependent left lower lobe (series 4, image 109). No pleural effusion or pneumothorax. Musculoskeletal: No chest wall mass or suspicious bone lesions identified. CT ABDOMEN PELVIS FINDINGS Hepatobiliary: No solid liver abnormality is seen. No gallstones, gallbladder wall thickening, or biliary dilatation. Pancreas: Unremarkable. No pancreatic ductal dilatation or surrounding inflammatory changes.  Spleen: Normal in size without significant abnormality. Adrenals/Urinary Tract: Adrenal glands are unremarkable. Kidneys are normal, without renal calculi, solid lesion, or hydronephrosis. Bladder is unremarkable. Stomach/Bowel: Stomach is within normal limits. Appendix appears normal. No evidence of bowel wall thickening, distention, or inflammatory changes. Vascular/Lymphatic: Scattered aortic atherosclerosis. No enlarged abdominal or pelvic lymph nodes. Reproductive: No mass or other abnormality. Other: No abdominal wall hernia or abnormality. No abdominopelvic ascites. Musculoskeletal: No acute or significant osseous findings. IMPRESSION: 1.  No acute noncontrast findings in the chest, abdomen, or pelvis. 2.  Mild cardiomegaly. Three-vessel coronary artery stents. 3. Scattered aortic atherosclerosis. Aortic Atherosclerosis (ICD10-I70.0). Electronically Signed   By: Lauralyn Primes M.D.   On: 05/22/2020 15:45   CT CHEST WO CONTRAST  Result Date: 05/22/2020 CLINICAL DATA:  LVAD evaluation EXAM: CT CHEST, ABDOMEN AND PELVIS WITHOUT CONTRAST TECHNIQUE: Multidetector CT imaging of the chest, abdomen and pelvis was performed following the standard protocol without IV contrast. Oral enteric contrast was administered. COMPARISON:  CT abdomen pelvis, 07/28/2009 FINDINGS: CT CHEST FINDINGS Cardiovascular: Minimal aortic atherosclerosis. Mild cardiomegaly. Three-vessel coronary artery stents. No pericardial effusion. Right upper extremity PICC, tip positioned in the inferior superior vena cava. Mediastinum/Nodes: No enlarged mediastinal, hilar, or axillary lymph nodes. Thyroid gland, trachea, and esophagus demonstrate no significant findings. Lungs/Pleura: Minimal bandlike atelectasis or scarring of the dependent left lower lobe (series 4, image 109). No pleural effusion or pneumothorax. Musculoskeletal: No chest wall mass or suspicious bone lesions identified. CT ABDOMEN PELVIS FINDINGS Hepatobiliary: No solid liver  abnormality is seen. No gallstones, gallbladder wall thickening, or biliary dilatation. Pancreas: Unremarkable. No pancreatic ductal dilatation or surrounding inflammatory changes. Spleen: Normal in size without significant abnormality. Adrenals/Urinary Tract: Adrenal glands are unremarkable. Kidneys are normal, without renal calculi, solid lesion, or hydronephrosis. Bladder is unremarkable. Stomach/Bowel: Stomach is within normal limits. Appendix appears normal. No evidence of bowel wall thickening, distention, or inflammatory changes. Vascular/Lymphatic: Scattered aortic atherosclerosis. No enlarged abdominal or pelvic lymph nodes. Reproductive: No mass or other abnormality. Other: No abdominal wall hernia or abnormality. No abdominopelvic ascites. Musculoskeletal: No acute or significant osseous findings. IMPRESSION: 1.  No acute noncontrast findings in the chest, abdomen, or pelvis. 2.  Mild cardiomegaly. Three-vessel coronary artery stents. 3. Scattered aortic atherosclerosis. Aortic Atherosclerosis (ICD10-I70.0). Electronically Signed   By: Lauralyn Primes M.D.   On: 05/22/2020 15:45     Medications:     Scheduled Medications: . (feeding supplement) PROSource Plus  30 mL Oral BID BM  . aspirin EC  81 mg Oral Daily  . atorvastatin  80 mg Oral q1800  . buPROPion  150 mg Oral BID  . Chlorhexidine Gluconate Cloth  6 each Topical Daily  . digoxin  0.125 mg Oral Daily  . enoxaparin (LOVENOX) injection  40 mg Subcutaneous Daily  . insulin aspart  0-15 Units Subcutaneous TID WC  . insulin aspart  0-5 Units Subcutaneous QHS  . insulin glargine  23 Units Subcutaneous Daily  . potassium chloride  40 mEq Oral Daily  .  sacubitril-valsartan  1 tablet Oral BID  . sodium chloride flush  10-40 mL Intracatheter Q12H  . sodium chloride flush  3 mL Intravenous Q12H  . sodium chloride flush  3 mL Intravenous Q12H  . sodium chloride flush  3 mL Intravenous Q12H  . spironolactone  25 mg Oral Daily     Infusions: . sodium chloride    . sodium chloride 10 mL/hr at 05/21/20 0557  . sodium chloride    . sodium chloride    . milrinone 0.125 mcg/kg/min (05/23/20 0600)    PRN Medications: sodium chloride, sodium chloride, sodium chloride, acetaminophen, antiseptic oral rinse, nitroGLYCERIN, ondansetron (ZOFRAN) IV, sodium chloride flush, sodium chloride flush, sodium chloride flush, sodium chloride flush, triamcinolone cream   Assessment/Plan   1. CAD: RCA known to be chronically occluded. He has had multiple PCIs to LAD. Repeat LHC this admit done for recurrent angina demonstrated known chronic RCA occlusion as well as occlusion of the proximal LAD with minimal collaterals and no significant distal vessel present.  The left circumflex is patent. Per Dr. Lavinia Sharps, there is no role for revascularization. Being considered for LVAD.  - No s/s angina - Continue to hold Brilinta, in anticipation of potential surgery  - Continue ASA 81  - Continue Atorvastatin 80  2. Acute on Chronic systolic CHF w/ Low Output: Ischemic cardiomyopathy. Last echo in 7/21 with EF 25-30%. NYHA class II-III symptoms. RHC this admit demonstrated elevated PCWP, mild pulmonary venous hypertension and low cardiac output. CI 1.4. Started on milrinone 0.125. Initial co-ox on milrinone 57%. Co-ox up to 67% today. Echo yesterday showed EF 20-25%, mildly reduced RV. Not a candidate for cardiac transplant w/ poorly controlled DM and active tobacco use. He is being evaluated for LVAD but he needs to get his diabetes under much better control and completely abstain from smoking prior to doing that. On ilrinone 0.125 co-ox 64% CVP 6-8 range - stop milrinone. Follow co-ox  - Decrease Entresto 26-26bid - Hold Coreg for low w/ low BP/ output  - Continue Spiro 25 daily  - continue digoxin 0.125 daily  - Hgb A1c pending. If < 10, start [place]. (12.2%) - He is not a CRT candidate. Will arrange LifeVest at d/c for bridge possible  LVAD. - PFTs Monday   3. Type 2 diabetes: New diagnosis in 7/21 with hgbA1c 11.3. He was metformin PTA but has not seen his PCP since diagnosis was made. He will need much better control prior to VAD  - HgBa1c 12.1 - holding metformin post cath - Lantus 25 units ordered per DM coordinator, appreciate their assistance  - will refer to outpatient endocrinology  - continue statin  4. HTN: BP low today. Cutting back [place]  5. OSA: CPAP QHS  6. Smoking: 1-2 cigs/day. Smoking cessation strongly advised. - Continue Wellbutrin 7. HLD w/ LDL Goal < 70 - LDL elevated on recent lipid panel at 175 mg/dL - continue atorvastatin 80 qhs - plan referral to lipid clinic post d/c for PCSK9i    Length of Stay: 2  Arvilla Meres, MD  05/23/2020, 7:42 AM  Advanced Heart Failure Team Pager [phone] (M-F; 7a - 4p)  Please contact [facility] for night-coverage after hours (4p -7a ) and weekends on [email or web address]

## 2020-05-23 NOTE — Progress Notes (Signed)
Patient and caregiver Peter Russo asking questions about the vest, milrinone and plan for patient.Marland Kitchen Upon reviewing Dr Senaida Lange note, I shared it with the patient and caregiver.  After hearing the plan they both stated they felt better after hearing this plan.  IV capped and patient off to walk with his caregiver within the hospital.

## 2020-05-24 DIAGNOSIS — I5043 Acute on chronic combined systolic (congestive) and diastolic (congestive) heart failure: Secondary | ICD-10-CM | POA: Diagnosis not present

## 2020-05-24 DIAGNOSIS — I2510 Atherosclerotic heart disease of native coronary artery without angina pectoris: Secondary | ICD-10-CM | POA: Diagnosis not present

## 2020-05-24 LAB — HEMOGLOBIN A1C
Hgb A1c MFr Bld: 11.5 % — ABNORMAL HIGH (ref 4.8–5.6)
Mean Plasma Glucose: 283 mg/dL

## 2020-05-24 LAB — BASIC METABOLIC PANEL
Anion gap: 8 (ref 5–15)
BUN: 17 mg/dL (ref 6–20)
CO2: 27 mmol/L (ref 22–32)
Calcium: 8.9 mg/dL (ref 8.9–10.3)
Chloride: 102 mmol/L (ref 98–111)
Creatinine, Ser: 1 mg/dL (ref 0.61–1.24)
GFR, Estimated: >60 mL/min (ref >60)
Glucose, Bld: 164 mg/dL — ABNORMAL HIGH (ref 70–99)
Potassium: 3.9 mmol/L (ref 3.5–5.1)
Sodium: 137 mmol/L (ref 135–145)

## 2020-05-24 LAB — GLUCOSE, CAPILLARY
Glucose-Capillary: 206 mg/dL — ABNORMAL HIGH (ref 70–99)
Glucose-Capillary: 242 mg/dL — ABNORMAL HIGH (ref 70–99)

## 2020-05-24 LAB — HEPATITIS B SURFACE ANTIBODY, QUANTITATIVE: Hep B S AB Quant (Post): <3.1 m[IU]/mL — ABNORMAL LOW (ref >9.9)

## 2020-05-24 LAB — COOXEMETRY PANEL
Carboxyhemoglobin: 1.1 % (ref 0.5–1.5)
Methemoglobin: 0.8 % (ref 0.0–1.5)
O2 Saturation: 58.3 %
Total hemoglobin: 15.3 g/dL (ref 12.0–16.0)

## 2020-05-24 MED ORDER — POTASSIUM CHLORIDE CRYS ER 20 MEQ PO TBCR: 40.0000 meq | EXTENDED_RELEASE_TABLET | Freq: Every day | ORAL | 2 refills | Status: DC

## 2020-05-24 MED ORDER — "PEN NEEDLES 3/16"" 31G X 5 MM MISC": 1 refills | Status: DC

## 2020-05-24 MED ORDER — BLOOD GLUCOSE METER KIT: PACK | 0 refills | Status: AC

## 2020-05-24 MED ORDER — SACUBITRIL-VALSARTAN 24-26 MG PO TABS: 1.0000 | ORAL_TABLET | Freq: Two times a day (BID) | ORAL | 2 refills | Status: DC

## 2020-05-24 MED ORDER — DIGOXIN 125 MCG PO TABS: 0.1250 mg | ORAL_TABLET | Freq: Every day | ORAL | 2 refills | Status: DC

## 2020-05-24 MED ORDER — LANTUS SOLOSTAR 100 UNIT/ML ~~LOC~~ SOPN: 23.0000 [IU] | PEN_INJECTOR | Freq: Every day | SUBCUTANEOUS | 11 refills | Status: DC

## 2020-05-24 NOTE — Progress Notes (Signed)
Advanced Heart Failure Rounding Note  PCP-Cardiologist: Charlton Haws, MD   Subjective:    Milrinone stopped yesterday. Says he feels good. Able to walk halls without difficulty. Denies SOB, CP, orthopnea or PND. CVP 6  Co-ox 58%   Echo LVEF 20-25%, GIIDD, mildly reduced RV  Objective:   Weight Range: 114.2 kg Body mass index is 39.43 kg/m.   Vital Signs:   Temp:  [98.2 F (36.8 C)-98.6 F (37 C)] 98.6 F (37 C) (10/10 0741) Pulse Rate:  [62-84] 75 (10/10 0741) Resp:  [11-24] 19 (10/10 0741) BP: (90-146)/(53-112) 102/70 (10/10 0741) SpO2:  [93 %-100 %] 97 % (10/10 0741) Weight:  [114.2 kg-117.1 kg] 114.2 kg (10/10 0422) Last BM Date: 05/23/20  Weight change: Filed Weights   05/21/20 0534 05/23/20 1730 05/24/20 0422  Weight: 117.5 kg 117.1 kg 114.2 kg    Intake/Output:   Intake/Output Summary (Last 24 hours) at 05/24/2020 0945 Last data filed at 05/24/2020 0600 Gross per 24 hour  Intake 720 ml  Output 2450 ml  Net -1730 ml      Physical Exam    General:  Well appearing. No resp difficulty HEENT: normal Neck: supple. no JVD. Carotids 2+ bilat; no bruits. No lymphadenopathy or thryomegaly appreciated. Cor: PMI nondisplaced. Regular rate & rhythm. No rubs, gallops or murmurs. Lungs: clear Abdomen: soft, nontender, nondistended. No hepatosplenomegaly. No bruits or masses. Good bowel sounds. Extremities: no cyanosis, clubbing, rash, edema Neuro: alert & orientedx3, cranial nerves grossly intact. moves all 4 extremities w/o difficulty. Affect pleasant  Telemetry   Sinus 70s Personally reviewed   Labs    CBC Recent Labs    05/21/20 1037  WBC 8.6  NEUTROABS 5.4  HGB 16.0  HCT 47.8  MCV 87.5  PLT 187   Basic Metabolic Panel Recent Labs    90/24/09 0415 05/22/20 0415 05/23/20 0455 05/24/20 0434  NA 136   < > 136 137  K 4.2   < > 3.7 3.9  CL 102   < > 100 102  CO2 26   < > 26 27  GLUCOSE 225*   < > 180* 164*  BUN 12   < > 18 17   CREATININE 1.08   < > 1.14 1.00  CALCIUM 8.9   < > 8.6* 8.9  MG 1.8  --   --   --    < > = values in this interval not displayed.   Liver Function Tests Recent Labs    05/21/20 1037  AST 20  ALT 26  ALKPHOS 74  BILITOT 1.0  PROT 7.1  ALBUMIN 3.5   No results for input(s): LIPASE, AMYLASE in the last 72 hours. Cardiac Enzymes No results for input(s): CKTOTAL, CKMB, CKMBINDEX, TROPONINI in the last 72 hours.  BNP: BNP (last 3 results) Recent Labs    05/21/20 1037  BNP 161.1*    ProBNP (last 3 results) No results for input(s): PROBNP in the last 8760 hours.   D-Dimer No results for input(s): DDIMER in the last 72 hours. Hemoglobin A1C Recent Labs    05/22/20 0415  HGBA1C 12.2*   Fasting Lipid Panel No results for input(s): CHOL, HDL, LDLCALC, TRIG, CHOLHDL, LDLDIRECT in the last 72 hours. Thyroid Function Tests Recent Labs    05/21/20 1037  TSH 1.307    Other results:   Imaging    No results found.   Medications:     Scheduled Medications:  (feeding supplement) PROSource Plus  30 mL Oral  BID BM   aspirin EC  81 mg Oral Daily   atorvastatin  80 mg Oral q1800   buPROPion  150 mg Oral BID   Chlorhexidine Gluconate Cloth  6 each Topical Daily   digoxin  0.125 mg Oral Daily   enoxaparin (LOVENOX) injection  40 mg Subcutaneous Daily   furosemide  40 mg Oral Daily   insulin aspart  0-15 Units Subcutaneous TID WC   insulin aspart  0-5 Units Subcutaneous QHS   insulin glargine  23 Units Subcutaneous Daily   potassium chloride  40 mEq Oral Daily   sacubitril-valsartan  1 tablet Oral BID   sodium chloride flush  10-40 mL Intracatheter Q12H   sodium chloride flush  3 mL Intravenous Q12H   spironolactone  25 mg Oral Daily    Infusions:  sodium chloride 10 mL/hr at 05/21/20 0557   sodium chloride      PRN Medications: sodium chloride, acetaminophen, antiseptic oral rinse, nitroGLYCERIN, ondansetron (ZOFRAN) IV, sodium chloride  flush, sodium chloride flush, triamcinolone cream   Assessment/Plan   1. CAD: RCA known to be chronically occluded. He has had multiple PCIs to LAD. Repeat LHC this admit done for recurrent angina demonstrated known chronic RCA occlusion as well as occlusion of the proximal LAD with minimal collaterals and no significant distal vessel present.  The left circumflex is patent. Per Dr. Lavinia Sharps, there is no role for revascularization. Being considered for LVAD.  - No s/s angina - Continue to hold Brilinta, in anticipation of potential surgery  - Continue ASA 81  - Continue Atorvastatin 80  2. Acute on Chronic systolic CHF w/ Low Output: Ischemic cardiomyopathy. Last echo in 7/21 with EF 25-30%. NYHA class II-III symptoms. RHC this admit demonstrated elevated PCWP, mild pulmonary venous hypertension and low cardiac output. CI 1.4. Started on milrinone 0.125. Initial co-ox on milrinone 57%. Co-ox up to 67% today. Echo yesterday showed EF 20-25%, mildly reduced RV. Not a candidate for cardiac transplant w/ poorly controlled DM and active tobacco use. He is being evaluated for LVAD but he needs to get his diabetes under much better control and completely abstain from smoking prior to doing that. Milrinone stopped 10/9 co-ox 58% CVP 6 - stop milrinone. Follow co-ox  - Continue Entresto 26-26bid - Hold Coreg for low w/ low BP/ output  - Continue Spiro 25 daily  - continue digoxin 0.125 daily  - Hgb A1c 12.2%. If < 10, start [place]. - He is not a CRT candidate. He has refused LeftVest for now  - Needs PFTs for VAD w/u  3. Type 2 diabetes: New diagnosis in 7/21 with hgbA1c 11.3. He was metformin PTA but has not seen his PCP since diagnosis was made. He will need much better control prior to VAD  - HgBa1c 12.1 - holding metformin post cath - Lantus 25 units ordered per DM coordinator, appreciate their assistance  - will refer to outpatient endocrinology  - continue statin  4. HTN: BP low today.  Cutting back [place]  5. OSA: CPAP QHS  6. Smoking: 1-2 cigs/day. Smoking cessation strongly advised. - Continue Wellbutrin 7. HLD w/ LDL Goal < 70 - LDL elevated on recent lipid panel at 175 mg/dL - continue atorvastatin 80 qhs - plan referral to lipid clinic post d/c for PCSK9i   Long talk with him today. He is off milrinone and feels very good. He says that he wants help but is scared and needs a bit of time to get back on  track. He seems motivated to make change and accept help. That said he would like to go home today and spend some time with his family. He does not want a LifeVest at this time. He had good questions about follow-up and how to take his meds. We discussed management of his diabetes at length.   Will let him go home today with close outpatient f/u.  D/c meds [place] 81 Entresto 24/26 (bid) - lower dose Digoxin 0.125 Atorva 80 Buproprion Metformin 500 bid Lasix 40 daily - take extra as needed for fluid overload Cleda Daub 25 Potassium 40 daily Lantus 23u daily (insulin is new for him - will need supplies)  Stop:  Carvedilol Brilinta   F/u 1. HF clinic 1 week 2. PCP 3. Will need PFTs arranged as outpatient.   Length of Stay: 3  Arvilla Meres, MD  05/24/2020, 9:45 AM  Advanced Heart Failure Team Pager [phone] (M-F; 7a - 4p)  Please contact [facility] for night-coverage after hours (4p -7a ) and weekends on [email or web address]

## 2020-05-24 NOTE — Discharge Summary (Addendum)
Discharge Summary    Patient ID: DEJOUR VOS MRN: [id number]; DOB: 1976-07-16  Admit date: 05/21/2020 Discharge date: 05/24/2020  Primary Care Provider: Charlott Rakes, MD  Primary Cardiologist: Jenkins Rouge, MD/Dr. Loralie Champagne, MD   Discharge Diagnoses    Active Problems:   Acute on chronic combined systolic and diastolic CHF (congestive heart failure) ([place])   CHF (congestive heart failure) ([place])   Palliative care by specialist   Goals of care, counseling/discussion  CAD   Poorly controlled diabetes  Tobacco use   Essential HTN   Diagnostic Studies/Procedures    [facility] 05/21/20:  Elevated PCWP.  2. Mild pulmonary venous hypertension.  3. Low cardiac output.  4. Occluded RCA (known from past).  Occluded proximal LAD with minimal collaterals.  LCx is patent.    I do not think we have a revascularization option, multiple overlapping LAD stents, now occluded.  I do not think there are good surgical targets.  Reviewed with Dr. Martinique.    I will admit and review options with our surgeons.   Echocardiogram 05/21/20:   1. Left ventricular ejection fraction, by estimation, is 20 to 25%. The  left ventricle has severely decreased function. The left ventricle  demonstrates global hypokinesis. The left ventricular internal cavity size  was severely dilated. No LV thrombus  noted. Left ventricular diastolic parameters are consistent with Grade II  diastolic dysfunction (pseudonormalization).   2. Right ventricular systolic function is mildly reduced. The right  ventricular size is normal. There is normal pulmonary artery systolic  pressure. The estimated right ventricular systolic pressure is 45.3 mmHg.   3. Left atrial size was mildly dilated.   4. The mitral valve is normal in structure. Trivial mitral valve  regurgitation. No evidence of mitral stenosis.   5. The aortic valve is tricuspid. Aortic valve regurgitation is not  visualized. No aortic stenosis is present.    6. The inferior vena cava is normal in size with greater than 50%  respiratory variability, suggesting right atrial pressure of 3 mmHg.   History of Present Illness     Peter Russo is a 44 y.o. male with history of CAD s/p multiple MIs and PCIs, ischemic cardiomyopathy, HTN, active smoking, and poorly controlled diabetes.    Patient has had multiple coronary interventions, first in 2010. His RCA is now chronically occluded, and he has had many stents in the LAD. Echo in 1/18 showed EF 40%. EF was down to 20-25% in 12/19. Echo in 7/21 was reported as EF 25-30%, moderate LV enlargement, normal RV.  Of note, patient seen in urgent care in 7/21 and noted to have blood glucose > 400.  HgbA1c was 11.3. He was started on metformin at urgent care, but has not seen his PCP since that time.    He was seen in clinic on 9/27 for surgical clearance prior to planned circumcision surgery. Blood glucose was >300 and he had endorsed increasing exertional CP and dyspnea. He was referred for [facility].    Hospital Course     Coronary angiography performed 05/21/20 showed occluded RCA (known from past) and occluded pLAD w/ minimal collaterals. His LCx was patent. RHC demonstrated elevated PCWP with mild pulmonary venous hypertension and low cardiac output. CI 1.48. Given this he was admitted for the management of acute on chronic systolic HF with low output and surgical evaluation for potential CABG.    Unfortunately after further review by Dr. Cyndia Bent, it was felt there were no target for revascularization and he  is not felt to be a transplant candidate. He will be considered for LVAD however DM needs to be under better control. He was started on Milrinone therapy as well as Lasix for mild elevated PCWP. He was considered for LifeVest placement given poor LV function however patient deferred.   Milrinone stopped 05/23/20. CVP and Co-ox remained stable at 6 and 58%. Weight at [facility] problems  include:  CAD:  -Repeat LHC this admit done for recurrent angina demonstrated occluded RCA (known from past) and occluded pLAD w/ minimal collaterals. His LCx is patent. There are no revascularization options per Dr. Cyndia Bent. Plan for LVAD workup - Hold Brilinta, in anticipation of potential surgery  - Continue ASA 81  - Continue Atorvastatin 80   Acute on Chronic systolic CHF w/ Low Output: Ischemic cardiomyopathy:  -Last echo in 7/21 with EF 25-30%.  NYHA class II-III symptoms.  RHC this admit demonstrated elevated PCWP, mild pulmonary venous hypertension and low cardiac output. CI 1.4.  - Decrease Entresto to 24/26 dosing - Spiro 25 daily  - Continue digoxin 0.125 daily - He is not a candidate for cardiac transplant w/ poorly controlled DM and active tobacco use  - Start w/u for potential LVAD. CT surgery and VAD coordinators to see today - He is not a CRT candidate. Not interested in LifeVest at this time     Type 2 diabetes:  -New diagnosis in 7/21 with hgbA1c 11.3. He is on metformin but has not seen his PCP since diagnosis was made.  - Start Lantus 23u daily (insulin is new for him - will need supplies) - Reviewed with DM Coordinator>>>ordered supplies  - Per DM Coordinator: Spoke with patient again today regarding importance of glycemic control  Discussed goal blood sugars and A1C.  Provided emotional support as well for patient as he discussed his current diagnosis and situation.  Patient states he is agreeable to taking insulin and checking blood sugars.   Educated patient on insulin pen use at home.  Reviewed all steps of insulin pen including attachment of needle, 2-unit air shot, dialing up dose, giving injection, removing needle, disposal of sharps, storage of unused insulin, disposal of insulin etc.  Patient seemed to prefer insulin pen.  May possibly be able to be on Lantus and Metformin at d/c for glycemic control   HTN:  -BP is controlled.   OSA:  -CPAP QHS   Smoking:   -1-2 cigs/day. Smoking cessation strongly advised. - Continue Wellbutrin  HLD w/ LDL Goal < 70 - LDL elevated on recent lipid panel at 175 mg/dL - continue atorvastatin 80 qhs - plan referral to lipid clinic post d/c for PCSK9i    Consultants: TCTS   The patient was seen and examined by Dr. Haroldine Laws who feels that the patient is stable and ready for discharge today, 05/24/20.   Did the patient have an acute coronary syndrome (MI, NSTEMI, STEMI, etc) this admission?:  No                               Did the patient have a percutaneous coronary intervention (stent / angioplasty)?:  No.   _____________  Discharge Vitals Blood pressure 112/78, pulse 79, temperature 98.5 F (36.9 C), temperature source Oral, resp. rate (!) 22, height _0  (1.702 m), weight 114.2 kg, SpO2 97 %.  Filed Weights   05/21/20 0534 05/23/20 1730 05/24/20 0422  Weight: 117.5 kg 117.1 kg  114.2 kg    Labs & Radiologic Studies    CBC No results for input(s): WBC, NEUTROABS, HGB, HCT, MCV, PLT in the last 72 hours. Basic Metabolic Panel Recent Labs    62/31/29 0415 05/22/20 0415 05/23/20 0455 05/24/20 0434  NA 136   < > 136 137  K 4.2   < > 3.7 3.9  CL 102   < > 100 102  CO2 26   < > 26 27  GLUCOSE 225*   < > 180* 164*  BUN 12   < > 18 17  CREATININE 1.08   < > 1.14 1.00  CALCIUM 8.9   < > 8.6* 8.9  MG 1.8  --   --   --    < > = values in this interval not displayed.   Liver Function Tests No results for input(s): AST, ALT, ALKPHOS, BILITOT, PROT, ALBUMIN in the last 72 hours. No results for input(s): LIPASE, AMYLASE in the last 72 hours. High Sensitivity Troponin:   No results for input(s): TROPONINIHS in the last 720 hours.  BNP Invalid input(s): POCBNP D-Dimer No results for input(s): DDIMER in the last 72 hours. Hemoglobin A1C Recent Labs    05/22/20 0415  HGBA1C 12.2*   Fasting Lipid Panel No results for input(s): CHOL, HDL, LDLCALC, TRIG, CHOLHDL, LDLDIRECT in the last 72  hours. Thyroid Function Tests No results for input(s): TSH, T4TOTAL, T3FREE, THYROIDAB in the last 72 hours.  Invalid input(s): FREET3 _____________  CT ABDOMEN PELVIS WO CONTRAST  Result Date: 05/22/2020 CLINICAL DATA:  LVAD evaluation EXAM: CT CHEST, ABDOMEN AND PELVIS WITHOUT CONTRAST TECHNIQUE: Multidetector CT imaging of the chest, abdomen and pelvis was performed following the standard protocol without IV contrast. Oral enteric contrast was administered. COMPARISON:  CT abdomen pelvis, 07/28/2009 FINDINGS: CT CHEST FINDINGS Cardiovascular: Minimal aortic atherosclerosis. Mild cardiomegaly. Three-vessel coronary artery stents. No pericardial effusion. Right upper extremity PICC, tip positioned in the inferior superior vena cava. Mediastinum/Nodes: No enlarged mediastinal, hilar, or axillary lymph nodes. Thyroid gland, trachea, and esophagus demonstrate no significant findings. Lungs/Pleura: Minimal bandlike atelectasis or scarring of the dependent left lower lobe (series 4, image 109). No pleural effusion or pneumothorax. Musculoskeletal: No chest wall mass or suspicious bone lesions identified. CT ABDOMEN PELVIS FINDINGS Hepatobiliary: No solid liver abnormality is seen. No gallstones, gallbladder wall thickening, or biliary dilatation. Pancreas: Unremarkable. No pancreatic ductal dilatation or surrounding inflammatory changes. Spleen: Normal in size without significant abnormality. Adrenals/Urinary Tract: Adrenal glands are unremarkable. Kidneys are normal, without renal calculi, solid lesion, or hydronephrosis. Bladder is unremarkable. Stomach/Bowel: Stomach is within normal limits. Appendix appears normal. No evidence of bowel wall thickening, distention, or inflammatory changes. Vascular/Lymphatic: Scattered aortic atherosclerosis. No enlarged abdominal or pelvic lymph nodes. Reproductive: No mass or other abnormality. Other: No abdominal wall hernia or abnormality. No abdominopelvic ascites.  Musculoskeletal: No acute or significant osseous findings. IMPRESSION: 1.  No acute noncontrast findings in the chest, abdomen, or pelvis. 2.  Mild cardiomegaly. Three-vessel coronary artery stents. 3. Scattered aortic atherosclerosis. Aortic Atherosclerosis (ICD10-I70.0). Electronically Signed   By: Lauralyn Primes M.D.   On: 05/22/2020 15:45   CT CHEST WO CONTRAST  Result Date: 05/22/2020 CLINICAL DATA:  LVAD evaluation EXAM: CT CHEST, ABDOMEN AND PELVIS WITHOUT CONTRAST TECHNIQUE: Multidetector CT imaging of the chest, abdomen and pelvis was performed following the standard protocol without IV contrast. Oral enteric contrast was administered. COMPARISON:  CT abdomen pelvis, 07/28/2009 FINDINGS: CT CHEST FINDINGS Cardiovascular: Minimal aortic atherosclerosis. Mild cardiomegaly.  Three-vessel coronary artery stents. No pericardial effusion. Right upper extremity PICC, tip positioned in the inferior superior vena cava. Mediastinum/Nodes: No enlarged mediastinal, hilar, or axillary lymph nodes. Thyroid gland, trachea, and esophagus demonstrate no significant findings. Lungs/Pleura: Minimal bandlike atelectasis or scarring of the dependent left lower lobe (series 4, image 109). No pleural effusion or pneumothorax. Musculoskeletal: No chest wall mass or suspicious bone lesions identified. CT ABDOMEN PELVIS FINDINGS Hepatobiliary: No solid liver abnormality is seen. No gallstones, gallbladder wall thickening, or biliary dilatation. Pancreas: Unremarkable. No pancreatic ductal dilatation or surrounding inflammatory changes. Spleen: Normal in size without significant abnormality. Adrenals/Urinary Tract: Adrenal glands are unremarkable. Kidneys are normal, without renal calculi, solid lesion, or hydronephrosis. Bladder is unremarkable. Stomach/Bowel: Stomach is within normal limits. Appendix appears normal. No evidence of bowel wall thickening, distention, or inflammatory changes. Vascular/Lymphatic: Scattered aortic  atherosclerosis. No enlarged abdominal or pelvic lymph nodes. Reproductive: No mass or other abnormality. Other: No abdominal wall hernia or abnormality. No abdominopelvic ascites. Musculoskeletal: No acute or significant osseous findings. IMPRESSION: 1.  No acute noncontrast findings in the chest, abdomen, or pelvis. 2.  Mild cardiomegaly. Three-vessel coronary artery stents. 3. Scattered aortic atherosclerosis. Aortic Atherosclerosis (ICD10-I70.0). Electronically Signed   By: Eddie Candle M.D.   On: 05/22/2020 15:45   CARDIAC CATHETERIZATION  Result Date: 05/21/2020 1. Elevated PCWP. 2. Mild pulmonary venous hypertension. 3. Low cardiac output. 4. Occluded RCA (known from past).  Occluded proximal LAD with minimal collaterals.  LCx is patent. I do not think we have a revascularization option, multiple overlapping LAD stents, now occluded.  I do not think there are good surgical targets.  Reviewed with Dr. Martinique. I will admit and review options with our surgeons.   ECHOCARDIOGRAM COMPLETE  Result Date: 05/21/2020    ECHOCARDIOGRAM REPORT   Patient Name:   Peter Russo Date of Exam: 05/21/2020 Medical Rec #:  [id number]         Height:       67.0 in Accession #:    [id number]        Weight:       259.0 lb Date of Birth:  11/29/1975         BSA:          2.257 m Patient Age:    44 years          BP:           127/84 mmHg Patient Gender: M                 HR:           59 bpm. Exam Location:  Inpatient Procedure: 2D Echo, Cardiac Doppler, Color Doppler and Intracardiac            Opacification Agent Indications:    CHF-Acute Systolic 353.29 / J24.26  History:        Patient has prior history of Echocardiogram examinations, most                 recent 02/26/2020. CHF and Cardiomyopathy, CAD; Risk                 Factors:Current Smoker.  Sonographer:    Bernadene Person RDCS Referring Phys: [place]  1. Left ventricular ejection fraction, by estimation, is 20 to 25%. The left ventricle  has severely decreased function. The left ventricle demonstrates global hypokinesis. The left ventricular internal cavity size was severely dilated. No LV thrombus noted. Left ventricular  diastolic parameters are consistent with Grade II diastolic dysfunction (pseudonormalization).  2. Right ventricular systolic function is mildly reduced. The right ventricular size is normal. There is normal pulmonary artery systolic pressure. The estimated right ventricular systolic pressure is 01.6 mmHg.  3. Left atrial size was mildly dilated.  4. The mitral valve is normal in structure. Trivial mitral valve regurgitation. No evidence of mitral stenosis.  5. The aortic valve is tricuspid. Aortic valve regurgitation is not visualized. No aortic stenosis is present.  6. The inferior vena cava is normal in size with greater than 50% respiratory variability, suggesting right atrial pressure of 3 mmHg. FINDINGS  Left Ventricle: Left ventricular ejection fraction, by estimation, is 20 to 25%. The left ventricle has severely decreased function. The left ventricle demonstrates global hypokinesis. The left ventricular internal cavity size was severely dilated. There is no left ventricular hypertrophy. Left ventricular diastolic parameters are consistent with Grade II diastolic dysfunction (pseudonormalization). Right Ventricle: The right ventricular size is normal. No increase in right ventricular wall thickness. Right ventricular systolic function is mildly reduced. There is normal pulmonary artery systolic pressure. The tricuspid regurgitant velocity is 2.62 m/s, and with an assumed right atrial pressure of 3 mmHg, the estimated right ventricular systolic pressure is 55.3 mmHg. Left Atrium: Left atrial size was mildly dilated. Right Atrium: Right atrial size was normal in size. Pericardium: There is no evidence of pericardial effusion. Mitral Valve: The mitral valve is normal in structure. Trivial mitral valve regurgitation. No  evidence of mitral valve stenosis. Tricuspid Valve: The tricuspid valve is normal in structure. Tricuspid valve regurgitation is not demonstrated. Aortic Valve: The aortic valve is tricuspid. Aortic valve regurgitation is not visualized. No aortic stenosis is present. Pulmonic Valve: The pulmonic valve was normal in structure. Pulmonic valve regurgitation is not visualized. Aorta: The aortic root is normal in size and structure. Venous: The inferior vena cava is normal in size with greater than 50% respiratory variability, suggesting right atrial pressure of 3 mmHg. IAS/Shunts: No atrial level shunt detected by color flow Doppler.  LEFT VENTRICLE PLAX 2D LVIDd:         7.40 cm  Diastology LVIDs:         7.00 cm  LV e' medial:    2.83 cm/s LV PW:         0.70 cm  LV E/e' medial:  30.4 LV IVS:        0.80 cm  LV e' lateral:   4.78 cm/s LVOT diam:     2.10 cm  LV E/e' lateral: 18.0 LV SV:         44 LV SV Index:   20 LVOT Area:     3.46 cm  RIGHT VENTRICLE RV S prime:     12.50 cm/s TAPSE (M-mode): 1.9 cm LEFT ATRIUM             Index       RIGHT ATRIUM           Index LA diam:        4.40 cm 1.95 cm/m  RA Area:     12.10 cm LA Vol (A2C):   55.7 ml 24.68 ml/m RA Volume:   21.80 ml  9.66 ml/m LA Vol (A4C):   46.3 ml 20.51 ml/m LA Biplane Vol: 54.6 ml 24.19 ml/m  AORTIC VALVE LVOT Vmax:   68.10 cm/s LVOT Vmean:  47.600 cm/s LVOT VTI:    0.128 m  AORTA Ao Root diam: 2.60 cm Ao  Asc diam:  2.80 cm MITRAL VALVE               TRICUSPID VALVE MV Area (PHT): 3.27 cm    TR Peak grad:   27.5 mmHg MV Decel Time: 232 msec    TR Vmax:        262.00 cm/s MV E velocity: 85.90 cm/s MV A velocity: 43.70 cm/s  SHUNTS MV E/A ratio:  1.97        Systemic VTI:  0.13 m                            Systemic Diam: 2.10 cm Loralie Champagne MD Electronically signed by Loralie Champagne MD Signature Date/Time: 05/21/2020/6:01:15 PM    Final    [place] EKG SITE RITE  Result Date: 05/21/2020 If Site Rite image not attached, placement could not be  confirmed due to current cardiac rhythm.  Disposition   Pt is being discharged home today in good condition.  Follow-up Plans & Appointments    Follow-up Information     Larey Dresser, MD Follow up on 06/03/2020.   Specialty: Cardiology Why: at 2pm  Contact information: [place] [place] [address] [phone]                Discharge Instructions     (HEART FAILURE PATIENTS) Call MD:  Anytime you have any of the following symptoms: 1) 3 pound weight gain in 24 hours or 5 pounds in 1 week 2) shortness of breath, with or without a dry hacking cough 3) swelling in the hands, feet or stomach 4) if you have to sleep on extra pillows at night in order to breathe.   Complete by: As directed    Amb Referral to Cardiac Rehabilitation   Complete by: As directed    Diagnosis: Heart Failure (see criteria below if ordering Phase II)   Heart Failure Type: Chronic Systolic & Diastolic   After initial evaluation and assessments completed: Virtual Based Care may be provided alone or in conjunction with Phase 2 Cardiac Rehab based on patient barriers.: Yes   Ambulatory referral to Endocrinology   Complete by: As directed    Avoid straining   Complete by: As directed    Call MD for:  difficulty breathing, headache or visual disturbances   Complete by: As directed    Call MD for:  extreme fatigue   Complete by: As directed    Call MD for:  hives   Complete by: As directed    Call MD for:  persistant dizziness or light-headedness   Complete by: As directed    Call MD for:  persistant nausea and vomiting   Complete by: As directed    Call MD for:  redness, tenderness, or signs of infection (pain, swelling, redness, odor or green/yellow discharge around incision site)   Complete by: As directed    Call MD for:  severe uncontrolled pain   Complete by: As directed    Call MD for:  temperature >100.4   Complete by: As directed    Consult to diabetes  coordinator   Complete by: As directed    Diet - low sodium heart healthy   Complete by: As directed    Heart Failure patients record your daily weight using the same scale at the same time of day   Complete by: As directed    Increase activity slowly   Complete by:  As directed    STOP any activity that causes chest pain, shortness of breath, dizziness, sweating, or exessive weakness   Complete by: As directed       Discharge Medications   Allergies as of 05/24/2020   No Known Allergies      Medication List     STOP taking these medications    carvedilol 12.5 MG tablet Commonly known as: COREG   Entresto 97-103 MG Generic drug: sacubitril-valsartan Replaced by: sacubitril-valsartan 24-26 MG   ticagrelor 90 MG Tabs tablet Commonly known as: BRILINTA       TAKE these medications    acetaminophen 325 MG tablet Commonly known as: TYLENOL Take 2 tablets (650 mg total) by mouth every 4 (four) hours as needed for headache or mild pain.   antiseptic oral rinse Liqd 1 application by Mouth Rinse route daily as needed for dry mouth.   aspirin 81 MG EC tablet Take 1 tablet (81 mg total) by mouth daily.   atorvastatin 80 MG tablet Commonly known as: LIPITOR TAKE 1 TABLET BY MOUTH EVERY DAY AT 6 PM What changed: See the new instructions.   blood glucose meter kit and supplies Dispense based on patient and insurance preference. Use up to four times daily as directed. (FOR ICD-10 E10.9, E11.9).   buPROPion 150 MG 24 hr tablet Commonly known as: WELLBUTRIN XL Take 1 tablet (150 mg total) by mouth daily.   clotrimazole-betamethasone cream Commonly known as: Lotrisone Apply 1 application topically 2 (two) times daily. What changed:  when to take this reasons to take this   digoxin 0.125 MG tablet Commonly known as: LANOXIN Take 1 tablet (0.125 mg total) by mouth daily. Start taking on: May 25, 2020   furosemide 40 MG tablet Commonly known as: LASIX Take 1  tablet (40 mg total) by mouth daily.   Lantus SoloStar 100 UNIT/ML Solostar Pen Generic drug: insulin glargine Inject 23 Units into the skin daily.   metFORMIN 500 MG tablet Commonly known as: GLUCOPHAGE Take 1 tablet (500 mg total) by mouth 2 (two) times daily with a meal.   nitroGLYCERIN 0.4 MG SL tablet Commonly known as: NITROSTAT PLACE 1 TABLET UNDER TONGUE EVERY 5 MINS, UP TO 3 DOSES AS NEEDED FOR CHEST PAIN What changed: See the new instructions.   Pen Needles 3/16" 31G X 5 MM Misc Lantus 23   potassium chloride SA 20 MEQ tablet Commonly known as: KLOR-CON Take 2 tablets (40 mEq total) by mouth daily. Start taking on: May 25, 2020   sacubitril-valsartan 24-26 MG Commonly known as: ENTRESTO Take 1 tablet by mouth 2 (two) times daily. Replaces: Entresto 97-103 MG   spironolactone 25 MG tablet Commonly known as: ALDACTONE Take 1 tablet (25 mg total) by mouth daily.   triamcinolone cream 0.1 % Commonly known as: KENALOG Apply 1 application topically 2 (two) times daily. What changed:  when to take this reasons to take this        Outstanding Labs/Studies   BMET   Duration of Discharge Encounter   Greater than 30 minutes including physician time.  Signed, Kathyrn Drown, NP 05/24/2020, 12:12 PM  Agree with above. Please see my note for further details.  Glori Bickers, MD  6:02 PM

## 2020-05-24 NOTE — Plan of Care (Signed)
  Problem: Education: Goal: Ability to demonstrate management of disease process will improve Outcome: Adequate for Discharge Goal: Ability to verbalize understanding of medication therapies will improve Outcome: Adequate for Discharge Goal: Individualized Educational Video(s) Outcome: Adequate for Discharge   Problem: Activity: Goal: Capacity to carry out activities will improve Outcome: Adequate for Discharge   Problem: Cardiac: Goal: Ability to achieve and maintain adequate cardiopulmonary perfusion will improve Outcome: Adequate for Discharge   Problem: Education: Goal: Understanding of CV disease, CV risk reduction, and recovery process will improve Outcome: Adequate for Discharge Goal: Individualized Educational Video(s) Outcome: Adequate for Discharge   Problem: Activity: Goal: Ability to return to baseline activity level will improve Outcome: Adequate for Discharge   Problem: Cardiovascular: Goal: Ability to achieve and maintain adequate cardiovascular perfusion will improve Outcome: Adequate for Discharge Goal: Vascular access site(s) Level 0-1 will be maintained Outcome: Adequate for Discharge   Problem: Health Behavior/Discharge Planning: Goal: Ability to safely manage health-related needs after discharge will improve Outcome: Adequate for Discharge   Problem: Education: Goal: Knowledge of General Education information will improve Description: Including pain rating scale, medication(s)/side effects and non-pharmacologic comfort measures Outcome: Adequate for Discharge   Problem: Health Behavior/Discharge Planning: Goal: Ability to manage health-related needs will improve Outcome: Adequate for Discharge   Problem: Clinical Measurements: Goal: Ability to maintain clinical measurements within normal limits will improve Outcome: Adequate for Discharge Goal: Will remain free from infection Outcome: Adequate for Discharge Goal: Diagnostic test results will  improve Outcome: Adequate for Discharge Goal: Respiratory complications will improve Outcome: Adequate for Discharge Goal: Cardiovascular complication will be avoided Outcome: Adequate for Discharge   Problem: Activity: Goal: Risk for activity intolerance will decrease Outcome: Adequate for Discharge   Problem: Nutrition: Goal: Adequate nutrition will be maintained Outcome: Adequate for Discharge   Problem: Coping: Goal: Level of anxiety will decrease Outcome: Adequate for Discharge   Problem: Elimination: Goal: Will not experience complications related to bowel motility Outcome: Adequate for Discharge Goal: Will not experience complications related to urinary retention Outcome: Adequate for Discharge   Problem: Pain Managment: Goal: General experience of comfort will improve Outcome: Adequate for Discharge   Problem: Safety: Goal: Ability to remain free from injury will improve Outcome: Adequate for Discharge   Problem: Skin Integrity: Goal: Risk for impaired skin integrity will decrease Outcome: Adequate for Discharge   Problem: Increased Nutrient Needs (NI-5.1) Goal: Food and/or nutrient delivery Description: Individualized approach for food/nutrient provision. Outcome: Adequate for Discharge

## 2020-05-25 ENCOUNTER — Ambulatory Visit (HOSPITAL_COMMUNITY)
Admission: RE | Admit: 2020-05-25 | Discharge: 2020-05-25 | Disposition: A | Discharge status: Home / Self Care | Payer: Medicare Other | Source: Ambulatory Visit | Attending: Cardiology | Admitting: Cardiology

## 2020-05-25 ENCOUNTER — Telehealth: Payer: Self-pay

## 2020-05-25 ENCOUNTER — Other Ambulatory Visit: Payer: Self-pay

## 2020-05-25 DIAGNOSIS — I5042 Chronic combined systolic (congestive) and diastolic (congestive) heart failure: Secondary | ICD-10-CM | POA: Insufficient documentation

## 2020-05-25 LAB — PULMONARY FUNCTION TEST
DL/VA % pred: 75 %
DL/VA: 3.48 ml/min/mmHg/L
DLCO cor % pred: 56 %
DLCO cor: 15.56 ml/min/mmHg
DLCO unc % pred: 59 %
DLCO unc: 16.14 ml/min/mmHg
FEF 25-75 Post: 3.3 L/sec
FEF 25-75 Pre: 2.55 L/sec
FEF2575-%Change-Post: 29 %
FEF2575-%Pred-Post: 102 %
FEF2575-%Pred-Pre: 79 %
FEV1-%Change-Post: 11 %
FEV1-%Pred-Post: 82 %
FEV1-%Pred-Pre: 74 %
FEV1-Post: 2.56 L
FEV1-Pre: 2.3 L
FEV1FVC-%Change-Post: 0 %
FEV1FVC-%Pred-Pre: 103 %
FEV6-%Change-Post: 12 %
FEV6-%Pred-Post: 82 %
FEV6-%Pred-Pre: 73 %
FEV6-Post: 3.08 L
FEV6-Pre: 2.74 L
FEV6FVC-%Pred-Post: 102 %
FEV6FVC-%Pred-Pre: 102 %
FVC-%Change-Post: 12 %
FVC-%Pred-Post: 80 %
FVC-%Pred-Pre: 71 %
FVC-Post: 3.08 L
FVC-Pre: 2.74 L
Post FEV1/FVC ratio: 83 %
Post FEV6/FVC ratio: 100 %
Pre FEV1/FVC ratio: 84 %
Pre FEV6/FVC Ratio: 100 %
RV % pred: 103 %
RV: 1.85 L
TLC % pred: 77 %
TLC: 4.93 L

## 2020-05-25 MED ORDER — ALBUTEROL SULFATE (2.5 MG/3ML) 0.083% IN NEBU
2.5000 mg | INHALATION_SOLUTION | Freq: Once | RESPIRATORY_TRACT | Status: AC
  Administered 2020-05-25: 2.5 mg via RESPIRATORY_TRACT

## 2020-05-25 NOTE — Telephone Encounter (Signed)
Transition Care Management Follow-up Telephone Call Date of discharge and from where: 05/24/2020, [facility]   Call placed to patient and he said that he will be establishing care with a new PCP.  He has an appointment scheduled with Janeece Agee, NP 06/02/2020.   Dr Alvis Lemmings removed as PCP in Epic

## 2020-05-26 ENCOUNTER — Telehealth (HOSPITAL_COMMUNITY): Payer: Self-pay

## 2020-05-26 NOTE — Telephone Encounter (Signed)
Samara Snide, RN  05/26/2020 2:23 PM EDT Back to Top    Patient advised and verbalized understanding. Patient had question about his insulin pen dosage. Had lengthy conversation about how to use pen and what dosage (23units daily)

## 2020-05-26 NOTE — Telephone Encounter (Signed)
-----   Message from Laurey Morale, MD sent at 05/25/2020  1:10 PM EDT ----- Minimal obstructive airspace disease.

## 2020-05-27 LAB — FACTOR 5 LEIDEN

## 2020-06-01 ENCOUNTER — Telehealth (HOSPITAL_COMMUNITY): Payer: Self-pay | Admitting: Licensed Clinical Social Worker

## 2020-06-01 ENCOUNTER — Other Ambulatory Visit (HOSPITAL_COMMUNITY): Payer: Self-pay | Admitting: Unknown Physician Specialty

## 2020-06-01 ENCOUNTER — Other Ambulatory Visit: Payer: Self-pay | Admitting: Cardiology

## 2020-06-01 DIAGNOSIS — I5042 Chronic combined systolic (congestive) and diastolic (congestive) heart failure: Secondary | ICD-10-CM

## 2020-06-01 NOTE — Telephone Encounter (Signed)
CSW contacted patient to follow up on hospitalization and discharge. Patient reports he is doing well and starting to adjust to the fact that he will need some advanced therapies for his heart condition. Patient reports he lives with his sister and has a good support system with his SO [place]. Patient has 3 children 46yo, 18yo and 15yo all live with their mother. He states he has disability income and denies any financial concerns with obtaining meds. Patient has Medicare and Medicaid. CSW spoke with patient and SO [place] about the LVAD process and discussed completing the psychosocial assessment end of this week. [place] will retrun call to confirm appointment time. Lasandra Beech, LCSW, CCSW-MCS [phone]

## 2020-06-02 ENCOUNTER — Other Ambulatory Visit: Payer: Self-pay

## 2020-06-02 ENCOUNTER — Encounter: Payer: Self-pay | Admitting: Registered Nurse

## 2020-06-02 ENCOUNTER — Ambulatory Visit (INDEPENDENT_AMBULATORY_CARE_PROVIDER_SITE_OTHER): Payer: Medicare Other | Admitting: Registered Nurse

## 2020-06-02 VITALS — BP 134/79 | HR 87 | Temp 97.9°F | Resp 18 | Ht 67.0 in | Wt 253.8 lb

## 2020-06-02 DIAGNOSIS — I5042 Chronic combined systolic (congestive) and diastolic (congestive) heart failure: Secondary | ICD-10-CM

## 2020-06-02 DIAGNOSIS — Z7689 Persons encountering health services in other specified circumstances: Secondary | ICD-10-CM

## 2020-06-02 DIAGNOSIS — Z9114 Patient's other noncompliance with medication regimen: Secondary | ICD-10-CM

## 2020-06-02 DIAGNOSIS — E1165 Type 2 diabetes mellitus with hyperglycemia: Secondary | ICD-10-CM | POA: Diagnosis not present

## 2020-06-02 DIAGNOSIS — F411 Generalized anxiety disorder: Secondary | ICD-10-CM

## 2020-06-02 MED ORDER — POTASSIUM CHLORIDE CRYS ER 20 MEQ PO TBCR: 40.0000 meq | EXTENDED_RELEASE_TABLET | Freq: Every day | ORAL | 0 refills | Status: DC

## 2020-06-02 NOTE — Patient Instructions (Signed)
° ° ° °  If you have lab work done today you will be contacted with your lab results within the next 2 weeks.  If you have not heard from us then please contact us. The fastest way to get your results is to register for My Chart. ° ° °IF you received an x-ray today, you will receive an invoice from [facility]. Please contact [facility] at [phone] with questions or concerns regarding your invoice.  ° °IF you received labwork today, you will receive an invoice from [facility]. Please contact [facility] at [phone] with questions or concerns regarding your invoice.  ° °Our billing staff will not be able to assist you with questions regarding bills from these companies. ° °You will be contacted with the lab results as soon as they are available. The fastest way to get your results is to activate your My Chart account. Instructions are located on the last page of this paperwork. If you have not heard from us regarding the results in 2 weeks, please contact this office. °  ° ° ° °

## 2020-06-03 ENCOUNTER — Ambulatory Visit (HOSPITAL_COMMUNITY)
Admission: RE | Admit: 2020-06-03 | Discharge: 2020-06-03 | Disposition: A | Discharge status: Home / Self Care | Payer: Medicare Other | Source: Ambulatory Visit | Attending: Cardiology | Admitting: Cardiology

## 2020-06-03 ENCOUNTER — Ambulatory Visit (HOSPITAL_BASED_OUTPATIENT_CLINIC_OR_DEPARTMENT_OTHER)
Admission: RE | Admit: 2020-06-03 | Discharge: 2020-06-03 | Disposition: A | Discharge status: Home / Self Care | Payer: Medicare Other | Source: Ambulatory Visit | Attending: Cardiology | Admitting: Cardiology

## 2020-06-03 ENCOUNTER — Encounter (HOSPITAL_COMMUNITY): Payer: Self-pay | Admitting: Cardiology

## 2020-06-03 VITALS — BP 130/80 | HR 66 | Wt 257.6 lb

## 2020-06-03 DIAGNOSIS — I6523 Occlusion and stenosis of bilateral carotid arteries: Secondary | ICD-10-CM | POA: Diagnosis not present

## 2020-06-03 DIAGNOSIS — Z794 Long term (current) use of insulin: Secondary | ICD-10-CM | POA: Insufficient documentation

## 2020-06-03 DIAGNOSIS — I5022 Chronic systolic (congestive) heart failure: Secondary | ICD-10-CM | POA: Diagnosis not present

## 2020-06-03 DIAGNOSIS — I779 Disorder of arteries and arterioles, unspecified: Secondary | ICD-10-CM | POA: Insufficient documentation

## 2020-06-03 DIAGNOSIS — Z79899 Other long term (current) drug therapy: Secondary | ICD-10-CM | POA: Insufficient documentation

## 2020-06-03 DIAGNOSIS — I70203 Unspecified atherosclerosis of native arteries of extremities, bilateral legs: Secondary | ICD-10-CM

## 2020-06-03 DIAGNOSIS — I739 Peripheral vascular disease, unspecified: Secondary | ICD-10-CM | POA: Diagnosis not present

## 2020-06-03 DIAGNOSIS — I25119 Atherosclerotic heart disease of native coronary artery with unspecified angina pectoris: Secondary | ICD-10-CM | POA: Insufficient documentation

## 2020-06-03 DIAGNOSIS — Z955 Presence of coronary angioplasty implant and graft: Secondary | ICD-10-CM | POA: Diagnosis not present

## 2020-06-03 DIAGNOSIS — I255 Ischemic cardiomyopathy: Secondary | ICD-10-CM | POA: Diagnosis not present

## 2020-06-03 DIAGNOSIS — I5040 Unspecified combined systolic (congestive) and diastolic (congestive) heart failure: Secondary | ICD-10-CM | POA: Diagnosis not present

## 2020-06-03 DIAGNOSIS — G4733 Obstructive sleep apnea (adult) (pediatric): Secondary | ICD-10-CM | POA: Insufficient documentation

## 2020-06-03 DIAGNOSIS — I252 Old myocardial infarction: Secondary | ICD-10-CM | POA: Insufficient documentation

## 2020-06-03 DIAGNOSIS — I2510 Atherosclerotic heart disease of native coronary artery without angina pectoris: Secondary | ICD-10-CM | POA: Diagnosis not present

## 2020-06-03 DIAGNOSIS — F17210 Nicotine dependence, cigarettes, uncomplicated: Secondary | ICD-10-CM | POA: Insufficient documentation

## 2020-06-03 DIAGNOSIS — I5042 Chronic combined systolic (congestive) and diastolic (congestive) heart failure: Secondary | ICD-10-CM

## 2020-06-03 DIAGNOSIS — Z7982 Long term (current) use of aspirin: Secondary | ICD-10-CM | POA: Insufficient documentation

## 2020-06-03 DIAGNOSIS — I110 Hypertensive heart disease with heart failure: Secondary | ICD-10-CM | POA: Insufficient documentation

## 2020-06-03 DIAGNOSIS — Z8249 Family history of ischemic heart disease and other diseases of the circulatory system: Secondary | ICD-10-CM | POA: Insufficient documentation

## 2020-06-03 DIAGNOSIS — E119 Type 2 diabetes mellitus without complications: Secondary | ICD-10-CM | POA: Diagnosis not present

## 2020-06-03 LAB — DIGOXIN LEVEL: Digoxin Level: <0.2 ng/mL — ABNORMAL LOW (ref 1.0–2.0)

## 2020-06-03 LAB — BASIC METABOLIC PANEL
Anion gap: 9 (ref 5–15)
BUN: 11 mg/dL (ref 6–20)
CO2: 26 mmol/L (ref 22–32)
Calcium: 9.4 mg/dL (ref 8.9–10.3)
Chloride: 104 mmol/L (ref 98–111)
Creatinine, Ser: 1.02 mg/dL (ref 0.61–1.24)
GFR, Estimated: >60 mL/min (ref >60)
Glucose, Bld: 138 mg/dL — ABNORMAL HIGH (ref 70–99)
Potassium: 3.9 mmol/L (ref 3.5–5.1)
Sodium: 139 mmol/L (ref 135–145)

## 2020-06-03 MED ORDER — ENTRESTO 49-51 MG PO TABS: 1.0000 | ORAL_TABLET | Freq: Two times a day (BID) | ORAL | 3 refills | Status: DC

## 2020-06-03 MED ORDER — METFORMIN HCL 1000 MG PO TABS
1000.0000 mg | ORAL_TABLET | Freq: Two times a day (BID) | ORAL | 3 refills | Status: DC
Start: 2020-06-03 — End: 2020-09-01

## 2020-06-03 NOTE — Patient Instructions (Addendum)
INCREASE Metformin 1000mg  Twice Daily  INCREASE Entresto 49/51mg  Twice Daily  Labs done today, your results will be available in MyChart, we will contact you for abnormal readings.  Your physician has recommended that you have a cardiopulmonary stress test (CPX). CPX testing is a non-invasive measurement of heart and lung function. It replaces a traditional treadmill stress test. This type of test provides a tremendous amount of information that relates not only to your present condition but also for future outcomes. This test combines measurements of you ventilation, respiratory gas exchange in the lungs, electrocardiogram (EKG), blood pressure and physical response before, during, and following an exercise protocol.   Your physician recommends that you return for    Lab work in 2 weeks  Follow up with [facility] Clinic in 1 month  CPX test in 2 weeks  You have an appointment with Dr. at [facility] for November 24th at 1PM.  THEY WILL SEND PACKET WITH APPOINTMENT REMINDER AND INFORMATION ABOUT THE OFFICE   [address] [place], [place]  If you have any questions or concerns before your next appointment please send [phone] a message through [place] or call our office at [phone].    TO LEAVE A MESSAGE FOR THE NURSE SELECT OPTION 2, PLEASE LEAVE A MESSAGE INCLUDING: . YOUR NAME . DATE OF BIRTH . CALL BACK NUMBER . REASON FOR CALL**this is important as we prioritize the call backs  YOU WILL RECEIVE A CALL BACK THE SAME DAY AS LONG AS YOU CALL BEFORE 4:00 PM  At the [facility], you and your health needs are our priority. As part of our continuing mission to provide you with exceptional heart care, we have created designated Provider Care Teams. These Care Teams include your primary Cardiologist (physician) and Advanced Practice Providers (APPs- Physician Assistants and Nurse Practitioners) who all work together to provide you  with the care you need, when you need it.   You may see any of the following providers on your designated Care Team at your next follow up: [phone] Dr Marland Kitchen . Dr Arvilla Meres . Marca Ancona, NP . Tonye Becket, PA . Robbie Lis, PharmD   Please be sure to bring in all your medications bottles to every appointment.

## 2020-06-03 NOTE — Progress Notes (Signed)
Carotid Duplex and Ankle Brachial Index complete.  Please see CV proc tab for preliminary results. Levin Bacon- RDMS, RVT 2:31 PM  06/03/2020

## 2020-06-04 LAB — HEMOGLOBIN A1C
Hgb A1c MFr Bld: 11.5 % — ABNORMAL HIGH (ref 4.8–5.6)
Mean Plasma Glucose: 283 mg/dL

## 2020-06-04 NOTE — Progress Notes (Signed)
PCP: Maximiano Coss, NP Cardiology: Dr. Johnsie Cancel HF Cardiology: Dr. Aundra Dubin  44 y.o. with history of CAD s/p multiple MIs and PCIs, ischemic cardiomyopathy, HTN, active smoking, and diabetes.  Patient has had multiple coronary interventions, fist in 2010.  His RCA is now chronically occluded, and he has had many stents in the LAD.  Echo in 1/18 showed EF 40%.  EF was down to 20-25% in 12/19.  Echo in 7/21 was reported as EF 25-30%, moderate LV enlargement, normal RV.  Of note, patient seen in urgent care in 7/21 and noted to have blood glucose > 400.  HgbA1c was 11.3. He was started on metformin at urgent care with new diagnosis of diabetes.     Given more frequent chest pain and dyspnea with moderate exertion, I took him for LHC/RHC in 10/21.  This showed low output HF with CI 1.48.  His LAD was totally occluded (long segment) with no collaterals, the RCA was totally occluded with R-R collaterals, and the LCx was patent.  He was admitted and started on milrinone, diuresed.  He was weaned off milrinone.  Lifevest was recommended but he refused.  He was seen by cardiac surgery, not a candidate for CABG.  We began LVAD evaluation (active smoker, not transplant candidate).  Echo in 10/21 showed EF 20-25% with severe LV dilation.   Patient returns for followup of CHF and CAD.  He seems to be doing a bit better than last clinic visit, not having chest pain episodes though he has not been very active.  He is short of breath walking up stairs or with moderate exertion.  No dyspnea walking on flat ground. No palpitations. No orthopnea/PND.  He is now on Wellbutrin and has stopped smoking.   ECG (personally reviewed): NSR, LAFB  Labs (7/21): K 5, creatinine 1.11, glucose 464, hgbA1 11.3 Labs (9/21): LDL 175 Labs (10/21): K 3.9, creatinine 1.0, hgbA1c 12.2  PMH: 1. CAD:  - BMS to LAD in 2010.  - DES to RCA on 2012 - DES to LCx and LAD in 7/00, complicated by dissection with overlapping DES to mid LAD.  -  Anterior STEMI in 1/17 with DES to totally occluded LAD (stent thrombosis).  - 8/17 PCI to apical LAD, RCA noted to be totally occluded with collaterals.  - 3/18 anterior STEMI with PTCA to totally occluded LAD.  - Coronary angiography (10/21): LAD was totally occluded (long segment) with no collaterals, the RCA was totally occluded with R-R collaterals, and the LCx was patent 2. Chronic systolic CHF: Ischemic cardiomyopathy.   - Echo (1/18): EF 40%.  - Echo (12/19): EF 20-25% - Echo (7/21): EF 25-30%, moderate LV enlargement, mild LVH, RV normal.  - RHC (10/21): mean RA 6, PA 47/13, mean PCWP 20, CI 1.48, PVR 2.4 WU 3. HTN 4. H/o AVNRT 5. Smoking: Active smoker, 1-2 cigs/day.  - PFTs in 10/21 with minimal obstructive disease.  6. OSA: Uses CPAP 7. Type 2 diabetes: Diagnosed in 7/21 8. Carotid dopplers (10/21): 1-39% BICA stenosis.  9. PAD: ABIs (10/21) were mildly decreased.   Social History   Socioeconomic History  . Marital status: Significant Other    Spouse name: Not on file  . Number of children: 3  . Years of education: Not on file  . Highest education level: Not on file  Occupational History  . Occupation: Aeronautical engineer: [employer]  Tobacco Use  . Smoking status: Former Smoker    Packs/day: 1.00  Years: 24.00    Pack years: 24.00    Quit date: 11/03/2016    Years since quitting: 3.5  . Smokeless tobacco: Never Used  . Tobacco comment: quit on 03/15/16  Vaping Use  . Vaping Use: Never used  Substance and Sexual Activity  . Alcohol use: No  . Drug use: Yes    Types: Marijuana    Comment: last time 02/2016  . Sexual activity: Yes  Other Topics Concern  . Not on file  Social History Narrative   Lives in [place] by himself.  Sometimes stays with girlfriend in [place].  Does not routinely exercise.   Social Determinants of Health   Financial Resource Strain:   . Difficulty of Paying Living Expenses: Not on file  Food Insecurity:   . Worried About  Charity fundraiser in the Last Year: Not on file  . Ran Out of Food in the Last Year: Not on file  Transportation Needs:   . Lack of Transportation (Medical): Not on file  . Lack of Transportation (Non-Medical): Not on file  Physical Activity:   . Days of Exercise per Week: Not on file  . Minutes of Exercise per Session: Not on file  Stress:   . Feeling of Stress : Not on file  Social Connections:   . Frequency of Communication with Friends and Family: Not on file  . Frequency of Social Gatherings with Friends and Family: Not on file  . Attends Religious Services: Not on file  . Active Member of Clubs or Organizations: Not on file  . Attends Archivist Meetings: Not on file  . Marital Status: Not on file  Intimate Partner Violence:   . Fear of Current or Ex-Partner: Not on file  . Emotionally Abused: Not on file  . Physically Abused: Not on file  . Sexually Abused: Not on file   Family History  Problem Relation Age of Onset  . Arrhythmia Mother        MOTHER LIVED TO BE 102  . Coronary artery disease Mother   . Heart attack Mother   . Hypertension Mother   . Coronary artery disease Father 75  . Heart disease Father   . Heart attack Father   . Heart attack Brother   . Stroke Neg Hx    ROS: All systems reviewed and negative except as per HPI.   Current Outpatient Medications  Medication Sig Dispense Refill  . acetaminophen (TYLENOL) 325 MG tablet Take 2 tablets (650 mg total) by mouth every 4 (four) hours as needed for headache or mild pain.    Marland Kitchen aspirin 81 MG EC tablet Take 1 tablet (81 mg total) by mouth daily. 90 tablet 3  . atorvastatin (LIPITOR) 80 MG tablet TAKE 1 TABLET BY MOUTH EVERY DAY AT 6 PM (Patient taking differently: Take 80 mg by mouth daily at 6 PM. ) 90 tablet 1  . blood glucose meter kit and supplies Dispense based on patient and insurance preference. Use up to four times daily as directed. (FOR ICD-10 E10.9, E11.9). 1 each 0  . buPROPion  (WELLBUTRIN XL) 150 MG 24 hr tablet Take 1 tablet (150 mg total) by mouth daily. 90 tablet 3  . clotrimazole-betamethasone (LOTRISONE) cream Apply 1 application topically 2 (two) times daily. (Patient taking differently: Apply 1 application topically daily as needed (Rash). ) 30 g 0  . digoxin (LANOXIN) 0.125 MG tablet Take 1 tablet (0.125 mg total) by mouth daily. 30 tablet 2  .  furosemide (LASIX) 40 MG tablet Take 1 tablet (40 mg total) by mouth daily. 90 tablet 3  . insulin glargine (LANTUS SOLOSTAR) 100 UNIT/ML Solostar Pen Inject 23 Units into the skin daily. 15 mL 11  . Insulin Pen Needle (PEN NEEDLES 3/16") 31G X 5 MM MISC Lantus 23 100 each 1  . metFORMIN (GLUCOPHAGE) 1000 MG tablet Take 1 tablet (1,000 mg total) by mouth 2 (two) times daily with a meal. 60 tablet 3  . nitroGLYCERIN (NITROSTAT) 0.4 MG SL tablet PLACE 1 TABLET UNDER TONGUE EVERY 5 MINS, UP TO 3 DOSES AS NEEDED FOR CHEST PAIN (Patient taking differently: Place 0.4 mg under the tongue every 5 (five) minutes as needed for chest pain. ) 25 tablet 2  . potassium chloride SA (KLOR-CON) 20 MEQ tablet Take 2 tablets (40 mEq total) by mouth daily. Please make yearly appt with Dr. Johnsie Cancel for January 2022 for future refills. 1st attempt 180 tablet 0  . spironolactone (ALDACTONE) 25 MG tablet Take 1 tablet (25 mg total) by mouth daily. 90 tablet 3  . triamcinolone cream (KENALOG) 0.1 % Apply 1 application topically 2 (two) times daily. (Patient taking differently: Apply 1 application topically daily as needed (rash). ) 30 g 0  . sacubitril-valsartan (ENTRESTO) 49-51 MG Take 1 tablet by mouth 2 (two) times daily. 60 tablet 3   No current facility-administered medications for this encounter.   BP 130/80   Pulse 66   Wt 116.8 kg (257 lb 9.6 oz)   SpO2 99%   BMI 40.35 kg/m  General: NAD Neck: No JVD, no thyromegaly or thyroid nodule.  Lungs: Clear to auscultation bilaterally with normal respiratory effort. CV: Nondisplaced PMI.  Heart  regular S1/S2, no S3/S4, no murmur.  No peripheral edema.  No carotid bruit.  Difficult to palpate pedal pulses.  Abdomen: Soft, nontender, no hepatosplenomegaly, no distention.  Skin: Intact without lesions or rashes.  Neurologic: Alert and oriented x 3.  Psych: Normal affect. Extremities: No clubbing or cyanosis.  HEENT: Normal.    Assessment/Plan: 1. CAD: RCA known to be chronically occluded. He has had multiple PCIs to LAD. Repeat LHC in 10/21 done for recurrent angina demonstrated occluded RCA (known from past) and occluded pLAD with no collaterals. His LCx was patent. I do not think we have a revascularization option, multiple overlapping LAD stents, now occluded and there are no good surgical targets. Minimal chest pain currently.  - Continue ASA 81  - Continue Atorvastatin 80  2. Chronic systolic CHF: Ischemic cardiomyopathy. Echo in 10/21 with EF 20-25%, severe LV dilation, mildly decreased RV systolic function. NYHA class III symptoms. RHC in 10/21 demonstrated elevated PCWP, mild pulmonary venous hypertension and low cardiac output with CI 1.4. Patient was transiently on milrinone but weaned off.  Today, he is not volume overloaded.  Despite low CO on RHC, he is not markedly symptomatic.  - Continue Lasix 40 mg daily.  - Increase Entresto to 49/51 bid with BMET today and in 10 days.  - Off Coreg with low output.  - Continue spironolactone 25 daily  - Continue digoxin 0.125 daily, check level.   - Check Hgb A1c. If < 10, start [place].  - he is not a candidate for cardiac transplant w/ poorly controlled DM and active tobacco use (quit about a week ago).  - He is undergoing LVAD workup.  He had low CO on RHC though symptomatically does not seem that severe.  I will arrange for CPX to see if CPX  results match the RHC.  He has seen Dr. Cyndia Bent, needs better control of blood glucose prior to LVAD.  - He is not a CRT candidate. We recommended Lifevest but he refused.  If he does not  get LVAD in the near future, he will need ICD placement.    3. Type 2 diabetes: New diagnosis in 7/21 with hgbA1c 11.3. He is on metformin and Lantus.  Needs better control of HgbA1c to get LVAD.  - He has endocrinology appointment set up and is following with PCP.  - Increase metformin today to 1000 bid.   - Start Farxiga when hgbA1c is a bit lower.   4. OSA: CPAP QHS  5. Smoking:  He recently quit.  PFTs with minimal obstructive disease.  - Continue Wellbutrin 6. HLD: Goal LDL < 70. He is on atorvastatin 80 but last LDL 175.  - Refer to lipid clinic for PCSK9 inhibitor.   Loralie Champagne 06/04/2020

## 2020-06-05 ENCOUNTER — Telehealth (HOSPITAL_COMMUNITY): Payer: Self-pay | Admitting: *Deleted

## 2020-06-05 ENCOUNTER — Telehealth (HOSPITAL_COMMUNITY): Payer: Self-pay | Admitting: Licensed Clinical Social Worker

## 2020-06-05 NOTE — Telephone Encounter (Signed)
CSW left message for patient to return call to arrange visit to complete VAD psychosocial assessment. Lasandra Beech, LCSW, CCSW-MCS [phone]

## 2020-06-05 NOTE — Telephone Encounter (Signed)
-----   Message from Laurey Morale, MD sent at 06/04/2020 11:19 PM EDT ----- LDL has been really high.  Needs lipid clinic referral for [place].

## 2020-06-05 NOTE — Telephone Encounter (Signed)
Attempted to call pt and Left message to call back   

## 2020-06-08 ENCOUNTER — Other Ambulatory Visit: Payer: Self-pay

## 2020-06-08 ENCOUNTER — Ambulatory Visit (HOSPITAL_COMMUNITY): Payer: Medicare Other | Attending: Cardiology

## 2020-06-08 ENCOUNTER — Other Ambulatory Visit (HOSPITAL_COMMUNITY): Payer: Self-pay | Admitting: *Deleted

## 2020-06-08 DIAGNOSIS — I5022 Chronic systolic (congestive) heart failure: Secondary | ICD-10-CM

## 2020-06-09 NOTE — Progress Notes (Signed)
Patient advised and verbalized understanding. However patient would like some time to think about it first before placing referral

## 2020-06-16 ENCOUNTER — Telehealth (HOSPITAL_COMMUNITY): Payer: Self-pay | Admitting: *Deleted

## 2020-06-16 ENCOUNTER — Other Ambulatory Visit: Payer: Self-pay | Admitting: Cardiology

## 2020-06-16 NOTE — Telephone Encounter (Signed)
Spoke with Mr Morefield re: MRB discussion yesterday. Discussed need for EP referral / ICD. He is requesting to have time to "think about it" at this time. He states "I know I probably need one, but all of this is overwhelming, and I don't want to get emotional right now. I am tired of crying." He states he will call the VAD clinic if he decides he would like EP referral placed prior to coming to VAD clinic follow up 07/06/20. He is open to meeting with a VAD patient at his upcoming appointment to discuss living life with a VAD. VAD coordinators will set this up for him. Encouraged him to call VAD clinic with any questions, and/or he decides he would like to be referred to EP office. He verbalized understanding.   Alyce Pagan RN VAD Coordinator  Office: [phone]  24/7 Pager: [phone]

## 2020-06-17 ENCOUNTER — Encounter (HOSPITAL_BASED_OUTPATIENT_CLINIC_OR_DEPARTMENT_OTHER): Payer: Medicare Other | Admitting: Cardiology

## 2020-07-03 ENCOUNTER — Other Ambulatory Visit: Payer: Self-pay

## 2020-07-03 ENCOUNTER — Emergency Department (HOSPITAL_COMMUNITY): Payer: Medicare Other

## 2020-07-03 ENCOUNTER — Emergency Department (HOSPITAL_COMMUNITY)
Admission: EM | Admit: 2020-07-03 | Discharge: 2020-07-04 | Disposition: A | Discharge status: Home / Self Care | Payer: Medicare Other | Attending: Emergency Medicine | Admitting: Emergency Medicine

## 2020-07-03 ENCOUNTER — Encounter (HOSPITAL_COMMUNITY): Payer: Self-pay | Admitting: Emergency Medicine

## 2020-07-03 DIAGNOSIS — I5043 Acute on chronic combined systolic (congestive) and diastolic (congestive) heart failure: Secondary | ICD-10-CM | POA: Diagnosis not present

## 2020-07-03 DIAGNOSIS — Z794 Long term (current) use of insulin: Secondary | ICD-10-CM | POA: Insufficient documentation

## 2020-07-03 DIAGNOSIS — I2510 Atherosclerotic heart disease of native coronary artery without angina pectoris: Secondary | ICD-10-CM | POA: Diagnosis not present

## 2020-07-03 DIAGNOSIS — I110 Hypertensive heart disease with heart failure: Secondary | ICD-10-CM | POA: Insufficient documentation

## 2020-07-03 DIAGNOSIS — Z79899 Other long term (current) drug therapy: Secondary | ICD-10-CM | POA: Diagnosis not present

## 2020-07-03 DIAGNOSIS — F1590 Other stimulant use, unspecified, uncomplicated: Secondary | ICD-10-CM | POA: Diagnosis not present

## 2020-07-03 DIAGNOSIS — R072 Precordial pain: Secondary | ICD-10-CM | POA: Diagnosis not present

## 2020-07-03 DIAGNOSIS — R079 Chest pain, unspecified: Secondary | ICD-10-CM

## 2020-07-03 DIAGNOSIS — Z87891 Personal history of nicotine dependence: Secondary | ICD-10-CM | POA: Insufficient documentation

## 2020-07-03 DIAGNOSIS — Z7982 Long term (current) use of aspirin: Secondary | ICD-10-CM | POA: Diagnosis not present

## 2020-07-03 LAB — CBC
HCT: 44.3 % (ref 39.0–52.0)
Hemoglobin: 14.8 g/dL (ref 13.0–17.0)
MCH: 29.3 pg (ref 26.0–34.0)
MCHC: 33.4 g/dL (ref 30.0–36.0)
MCV: 87.7 fL (ref 80.0–100.0)
Platelets: 222 10*3/uL (ref 150–400)
RBC: 5.05 MIL/uL (ref 4.22–5.81)
RDW: 12.9 % (ref 11.5–15.5)
WBC: 11.8 10*3/uL — ABNORMAL HIGH (ref 4.0–10.5)
nRBC: 0 % (ref 0.0–0.2)

## 2020-07-03 LAB — BASIC METABOLIC PANEL
Anion gap: 12 (ref 5–15)
BUN: 14 mg/dL (ref 6–20)
CO2: 23 mmol/L (ref 22–32)
Calcium: 9.3 mg/dL (ref 8.9–10.3)
Chloride: 99 mmol/L (ref 98–111)
Creatinine, Ser: 1.05 mg/dL (ref 0.61–1.24)
GFR, Estimated: >60 mL/min (ref >60)
Glucose, Bld: 114 mg/dL — ABNORMAL HIGH (ref 70–99)
Potassium: 3.7 mmol/L (ref 3.5–5.1)
Sodium: 134 mmol/L — ABNORMAL LOW (ref 135–145)

## 2020-07-03 LAB — TROPONIN I (HIGH SENSITIVITY): Troponin I (High Sensitivity): <2 ng/L (ref <18)

## 2020-07-03 NOTE — ED Triage Notes (Addendum)
Pt presents to ED POV. Pt c/o mid, non radiating CP x3d. Pt reports pain is intermittent, pain is 5/10. Pt reports that he has had MI x4 and is in process of getting LVAD. Pt NAD in triage

## 2020-07-04 LAB — TROPONIN I (HIGH SENSITIVITY): Troponin I (High Sensitivity): <2 ng/L (ref <18)

## 2020-07-04 NOTE — ED Provider Notes (Signed)
[place] EMERGENCY DEPARTMENT Provider Note   CSN: [id number] Arrival date & time: 07/03/20  1946     History Chief Complaint  Patient presents with  . Chest Pain    Peter Russo is a 44 y.o. male.   Chest Pain Pain location:  Substernal area Pain quality: dull   Pain radiates to:  Does not radiate Pain severity:  Mild Timing:  Intermittent Chronicity:  New Context: movement   Relieved by:  None tried Worsened by:  Nothing      Past Medical History:  Diagnosis Date  . Anxiety   . CAD S/P percutaneous coronary angioplasty    a. s/p BMS-mLAD 2010. b. DES to RCA 2012. c. 04/2015: DES to LCx and PCI to LAD c/b dissection with subsequent overlapping DES to mLAD - r/i for NSTEMI afterwards; d. 08/2015 Ant STEMI in setting of noncompliance->118mAD ISR (3.0x16 Synergy DES); e. 03/2016 PCI of apical LAD; f. 03/2016 Relook Cath: patent LAD stents, RCA 100p CTO-->Med Rx; g. 10/2016 Ant STEMI: PTCA 100p LAD.  .Marland KitchenChronic combined systolic and diastolic CHF (congestive heart failure) ([facility]    a. 03/2016 Echo: EF 30-35%, Gr2 DD;  b. 08/2016 Echo: EF 40%.  . Depression   . Essential hypertension   . GAD (generalized anxiety disorder)   . GERD (gastroesophageal reflux disease)   . Hypercholesteremia   . Ischemic cardiomyopathy    a. prior EF 25-35 percent at cath, 35-40 percent by echo; b. 03/2016 Echo: EF 30-35%, diff HK, Gr2 DD; c. 08/2016 Echo: EF 40%, base/mid inferior/inferowseptal AK, mildly dil LA.  . Morbid obesity ([facility]   . Pre-diabetes   . SVT (supraventricular tachycardia) (HCC) APRROX 10 YRS AGO   PRESUMED AVNRT, ECHO 12/07 WITH EF 65%, MILD LAE  . Tobacco abuse    a. 20 + pack years, quit 10/2016.    Patient Active Problem List   Diagnosis Date Noted  . Palliative care by specialist   . Goals of care, counseling/discussion   . CHF (congestive heart failure) ([facility] 05/21/2020  . Uncircumcised male 02/12/2020  . Stable angina (HCC)   . Elevated  troponin   . Mixed hyperlipidemia   . Chest pain 11/17/2016  . CAD (coronary artery disease) 11/12/2016  . AKI (acute kidney injury) ([facility] 11/11/2016  . History of acute anterior wall MI 10/19/2016  . Acute on chronic combined systolic and diastolic CHF (congestive heart failure) ([facility]   . Ischemic cardiomyopathy   . H/O medication noncompliance   . Ischemic chest pain ([facility]   . Morbid obesity ([facility]   . Pre-diabetes   . Tobacco abuse   . CAD S/P percutaneous coronary angioplasty   . Essential hypertension   . Cellulitis of hand 05/27/2015  . 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 05/26/2015  . NSTEMI (non-ST elevated myocardial infarction) ([facility] 04/24/2015  . Depression 12/26/2014  . GAD (generalized anxiety disorder) 12/26/2014  . GERD (gastroesophageal reflux disease) 03/01/2013  . Dyslipidemia 03/14/2011    Past Surgical History:  Procedure Laterality Date  . CARDIAC CATHETERIZATION N/A 04/23/2015   Procedure: Left Heart Cath and Coronary Angiography;  Surgeon: PJosue Hector MD;  [facility];  Service: Cardiovascular;  Laterality: N/A;  . CARDIAC CATHETERIZATION N/A 04/23/2015   Procedure: Coronary Stent Intervention;  Surgeon: MSherren Mocha MD;  [facility];  Service: Cardiovascular;  Laterality: N/A;  . CARDIAC CATHETERIZATION N/A 09/14/2015   Procedure: Left Heart Cath and Coronary Angiography;  Surgeon: Belva Crome, MD;  LAD 100%, CFX 10% ISR, RCA 100% (chronic), EF 25-35%  . CARDIAC CATHETERIZATION N/A 09/14/2015   Procedure: Coronary Stent Intervention;  Surgeon: Belva Crome, MD;  Location: [place] [facility];  Service: Cardiovascular;  Laterality: N/A; 3.0 x 16 mm Synergy DES LAD  . CARDIAC CATHETERIZATION  03/16/2016  . CARDIAC CATHETERIZATION N/A 03/16/2016   Procedure: Left Heart Cath and Coronary Angiography;  Surgeon: Belva Crome, MD;  Location: [place] [facility];  Service: Cardiovascular;  Laterality: N/A;  . CARDIAC  CATHETERIZATION N/A 03/16/2016   Procedure: Coronary Balloon Angioplasty;  Surgeon: Belva Crome, MD;  Location: [place] [facility];  Service: Cardiovascular;  Laterality: N/A;  . CARDIAC CATHETERIZATION N/A 03/25/2016   Procedure: Left Heart Cath and Coronary Angiography;  Surgeon: Peter M Martinique, MD;  Location: [place] [facility];  Service: Cardiovascular;  Laterality: N/A;  . CORONARY ANGIOPLASTY WITH STENT PLACEMENT  2010; 2013  . CORONARY BALLOON ANGIOPLASTY N/A 10/19/2016   Procedure: Coronary Balloon Angioplasty;  Surgeon: Jettie Booze, MD;  Location: [place] [facility];  Service: Cardiovascular;  Laterality: N/A;  . INTRAVASCULAR ULTRASOUND/IVUS N/A 10/19/2016   Procedure: Intravascular Ultrasound/IVUS;  Surgeon: Jettie Booze, MD;  Location: [facility] [facility];  Service: Cardiovascular;  Laterality: N/A;  . LEFT HEART CATH AND CORONARY ANGIOGRAPHY N/A 10/19/2016   Procedure: Left Heart Cath and Coronary Angiography;  Surgeon: Jettie Booze, MD;  Location: [place] [facility];  Service: Cardiovascular;  Laterality: N/A;  . RIGHT/LEFT HEART CATH AND CORONARY ANGIOGRAPHY N/A 05/21/2020   Procedure: RIGHT/LEFT HEART CATH AND CORONARY ANGIOGRAPHY;  Surgeon: Larey Dresser, MD;  Location: [place] [facility];  Service: Cardiovascular;  Laterality: N/A;       Family History  Problem Relation Age of Onset  . Arrhythmia Mother        MOTHER LIVED TO BE 89  . Coronary artery disease Mother   . Heart attack Mother   . Hypertension Mother   . Coronary artery disease Father 71  . Heart disease Father   . Heart attack Father   . Heart attack Brother   . Stroke Neg Hx     Social History   Tobacco Use  . Smoking status: Former Smoker    Packs/day: 1.00    Years: 24.00    Pack years: 24.00    Quit date: 11/03/2016    Years since quitting: 3.6  . Smokeless tobacco: Never Used  . Tobacco comment: quit on 03/15/16  Vaping Use  . Vaping Use: Never used  Substance Use Topics    . Alcohol use: No  . Drug use: Yes    Types: Marijuana    Comment: last time 02/2016    Home Medications Prior to Admission medications   Medication Sig Start Date End Date Taking? Authorizing Provider  acetaminophen (TYLENOL) 325 MG tablet Take 2 tablets (650 mg total) by mouth every 4 (four) hours as needed for headache or mild pain. 11/13/16  Yes Kilroy, Doreene Burke, PA-C  aspirin 81 MG EC tablet Take 1 tablet (81 mg total) by mouth daily. 10/21/16  Yes Duke, Tami Lin, PA  atorvastatin (LIPITOR) 80 MG tablet TAKE 1 TABLET BY MOUTH EVERY DAY AT 6 PM Patient taking differently: Take 80 mg by mouth daily at 6 PM.  12/24/19  Yes Josue Hector, MD  buPROPion (WELLBUTRIN XL) 150 MG 24 hr tablet Take 1 tablet (150 mg total) by mouth  daily. 12/03/19  Yes Josue Hector, MD  clotrimazole-betamethasone (LOTRISONE) cream Apply 1 application topically 2 (two) times daily. Patient taking differently: Apply 1 application topically daily as needed (Rash).  02/12/20  Yes Freeman Caldron M, PA-C  digoxin (LANOXIN) 0.125 MG tablet Take 1 tablet (125 mcg total) by mouth daily. Please make yearly appt with Dr. Johnsie Cancel for January 2022 for future refills. Thank you 1st attempt 06/16/20  Yes Josue Hector, MD  furosemide (LASIX) 40 MG tablet Take 1 tablet (40 mg total) by mouth daily. 09/16/19  Yes Josue Hector, MD  insulin glargine (LANTUS SOLOSTAR) 100 UNIT/ML Solostar Pen Inject 23 Units into the skin daily. 05/24/20  Yes Kathyrn Drown D, NP  metFORMIN (GLUCOPHAGE) 1000 MG tablet Take 1 tablet (1,000 mg total) by mouth 2 (two) times daily with a meal. 06/03/20  Yes Larey Dresser, MD  nitroGLYCERIN (NITROSTAT) 0.4 MG SL tablet PLACE 1 TABLET UNDER TONGUE EVERY 5 MINS, UP TO 3 DOSES AS NEEDED FOR CHEST PAIN Patient taking differently: Place 0.4 mg under the tongue every 5 (five) minutes as needed for chest pain.  12/24/19  Yes Josue Hector, MD  sacubitril-valsartan (ENTRESTO) 49-51 MG Take 1 tablet by mouth  2 (two) times daily. 06/03/20  Yes Larey Dresser, MD  spironolactone (ALDACTONE) 25 MG tablet Take 1 tablet (25 mg total) by mouth daily. 11/12/19  Yes Josue Hector, MD  triamcinolone cream (KENALOG) 0.1 % Apply 1 application topically 2 (two) times daily. Patient taking differently: Apply 1 application topically daily as needed (rash).  02/26/20  Yes Wieters, Hallie C, PA-C  blood glucose meter kit and supplies Dispense based on patient and insurance preference. Use up to four times daily as directed. (FOR ICD-10 E10.9, E11.9). 05/24/20   Kathyrn Drown D, NP  Insulin Pen Needle (PEN NEEDLES 3/16") 31G X 5 MM MISC Lantus 23 05/24/20   Kathyrn Drown D, NP  potassium chloride SA (KLOR-CON) 20 MEQ tablet Take 2 tablets (40 mEq total) by mouth daily. Please make yearly appt with Dr. Johnsie Cancel for January 2022 for future refills. 1st attempt Patient not taking: Reported on 07/04/2020 06/02/20   Josue Hector, MD    Allergies    Patient has no known allergies.  Review of Systems   Review of Systems  Cardiovascular: Positive for chest pain.  All other systems reviewed and are negative.   Physical Exam Updated Vital Signs BP 117/62   Pulse 82   Temp 98.3 F (36.8 C) (Oral)   Resp 16   SpO2 96%   Physical Exam Vitals and nursing note reviewed.  Constitutional:      Appearance: He is well-developed.  HENT:     Head: Normocephalic and atraumatic.  Cardiovascular:     Rate and Rhythm: Normal rate.  Pulmonary:     Effort: Pulmonary effort is normal. No respiratory distress.  Chest:     Chest wall: No mass, deformity or tenderness.  Abdominal:     General: There is no distension.     Palpations: Abdomen is soft.  Musculoskeletal:        General: Normal range of motion.     Cervical back: Normal range of motion.  Skin:    General: Skin is warm and dry.  Neurological:     Mental Status: He is alert.     ED Results / Procedures / Treatments   Labs (all labs ordered are  listed, but only abnormal results are displayed) Labs  Reviewed  BASIC METABOLIC PANEL - Abnormal; Notable for the following components:      Result Value   Sodium 134 (*)    Glucose, Bld 114 (*)    All other components within normal limits  CBC - Abnormal; Notable for the following components:   WBC 11.8 (*)    All other components within normal limits  TROPONIN I (HIGH SENSITIVITY)  TROPONIN I (HIGH SENSITIVITY)    EKG EKG Interpretation  Date/Time:  Friday July 03 2020 20:00:44 EST Ventricular Rate:  80 PR Interval:  174 QRS Duration: 104 QT Interval:  408 QTC Calculation: 470 R Axis:   -64 Text Interpretation: Normal sinus rhythm Biatrial enlargement Left axis deviation Pulmonary disease pattern Left ventricular hypertrophy ( Cornell product ) Abnormal ECG Confirmed by Merrily Pew [phone]) on 07/04/2020 2:55:15 AM   Radiology DG Chest 2 View  Result Date: 07/03/2020 CLINICAL DATA:  44 year old male with chest pain. EXAM: CHEST - 2 VIEW COMPARISON:  Chest radiograph dated 01/07/2020. FINDINGS: Linear atelectasis/scarring in the lingula. No focal consolidation, pleural effusion, pneumothorax. The cardiac silhouette is within limits. Coronary vascular stent. No acute osseous pathology. IMPRESSION: No active cardiopulmonary disease. Electronically Signed   By: Anner Crete M.D.   On: 07/03/2020 20:36    Procedures Procedures (including critical care time)  Medications Ordered in ED Medications - No data to display  ED Course  I have reviewed the triage vital signs and the nursing notes.  Pertinent labs & imaging results that were available during my care of the patient were reviewed by me and considered in my medical decision making (see chart for details).    MDM Rules/Calculators/A&P                          Very high risk for ACS however he associoates this episode with stress/anxiety and playing with his children more.  His troponins were negative his EKG  is unchanged.  Patient is obviously very high risk at all times but I do not believe this is myocardial infarction.  He has follow-up in 3 days with his cardiologist.  No indication for admission at this time as he is being medically managed anyway.  Chest pain-free.  Will discharge.  Final Clinical Impression(s) / ED Diagnoses Final diagnoses:  None    Rx / DC Orders ED Discharge Orders    None       Nannette Zill, Corene Cornea, MD 07/04/20 [phone]

## 2020-07-06 ENCOUNTER — Encounter (HOSPITAL_COMMUNITY): Payer: Medicare Other

## 2020-07-06 ENCOUNTER — Telehealth (HOSPITAL_COMMUNITY): Payer: Self-pay | Admitting: Vascular Surgery

## 2020-07-06 NOTE — Telephone Encounter (Signed)
Left pt message giving f.u appt w/ [facility] 12/1 @ 2, asked pt to call back to confirm appt

## 2020-07-08 ENCOUNTER — Ambulatory Visit: Payer: Medicare Other | Admitting: Internal Medicine

## 2020-07-15 ENCOUNTER — Encounter (HOSPITAL_COMMUNITY): Payer: Self-pay | Admitting: Cardiology

## 2020-07-15 ENCOUNTER — Ambulatory Visit (HOSPITAL_COMMUNITY)
Admission: RE | Admit: 2020-07-15 | Discharge: 2020-07-15 | Disposition: A | Discharge status: Home / Self Care | Payer: Medicare Other | Source: Ambulatory Visit | Attending: Cardiology | Admitting: Cardiology

## 2020-07-15 ENCOUNTER — Other Ambulatory Visit: Payer: Self-pay

## 2020-07-15 VITALS — BP 122/80 | HR 89 | Wt 268.0 lb

## 2020-07-15 DIAGNOSIS — Z79899 Other long term (current) drug therapy: Secondary | ICD-10-CM | POA: Diagnosis not present

## 2020-07-15 DIAGNOSIS — Z7984 Long term (current) use of oral hypoglycemic drugs: Secondary | ICD-10-CM | POA: Diagnosis not present

## 2020-07-15 DIAGNOSIS — Z87891 Personal history of nicotine dependence: Secondary | ICD-10-CM | POA: Diagnosis not present

## 2020-07-15 DIAGNOSIS — I25119 Atherosclerotic heart disease of native coronary artery with unspecified angina pectoris: Secondary | ICD-10-CM | POA: Insufficient documentation

## 2020-07-15 DIAGNOSIS — E782 Mixed hyperlipidemia: Secondary | ICD-10-CM | POA: Diagnosis not present

## 2020-07-15 DIAGNOSIS — E1165 Type 2 diabetes mellitus with hyperglycemia: Secondary | ICD-10-CM | POA: Diagnosis not present

## 2020-07-15 DIAGNOSIS — Z7982 Long term (current) use of aspirin: Secondary | ICD-10-CM | POA: Insufficient documentation

## 2020-07-15 DIAGNOSIS — G4733 Obstructive sleep apnea (adult) (pediatric): Secondary | ICD-10-CM | POA: Diagnosis not present

## 2020-07-15 DIAGNOSIS — I5042 Chronic combined systolic (congestive) and diastolic (congestive) heart failure: Secondary | ICD-10-CM | POA: Diagnosis not present

## 2020-07-15 DIAGNOSIS — I252 Old myocardial infarction: Secondary | ICD-10-CM | POA: Diagnosis not present

## 2020-07-15 DIAGNOSIS — E118 Type 2 diabetes mellitus with unspecified complications: Secondary | ICD-10-CM | POA: Diagnosis not present

## 2020-07-15 DIAGNOSIS — I255 Ischemic cardiomyopathy: Secondary | ICD-10-CM | POA: Diagnosis not present

## 2020-07-15 DIAGNOSIS — E785 Hyperlipidemia, unspecified: Secondary | ICD-10-CM | POA: Diagnosis not present

## 2020-07-15 DIAGNOSIS — Z794 Long term (current) use of insulin: Secondary | ICD-10-CM | POA: Diagnosis not present

## 2020-07-15 DIAGNOSIS — I2582 Chronic total occlusion of coronary artery: Secondary | ICD-10-CM | POA: Diagnosis not present

## 2020-07-15 DIAGNOSIS — Z955 Presence of coronary angioplasty implant and graft: Secondary | ICD-10-CM | POA: Insufficient documentation

## 2020-07-15 DIAGNOSIS — I110 Hypertensive heart disease with heart failure: Secondary | ICD-10-CM | POA: Diagnosis present

## 2020-07-15 LAB — BASIC METABOLIC PANEL
Anion gap: 10 (ref 5–15)
BUN: 11 mg/dL (ref 6–20)
CO2: 26 mmol/L (ref 22–32)
Calcium: 9.1 mg/dL (ref 8.9–10.3)
Chloride: 106 mmol/L (ref 98–111)
Creatinine, Ser: 1.1 mg/dL (ref 0.61–1.24)
GFR, Estimated: >60 mL/min (ref >60)
Glucose, Bld: 153 mg/dL — ABNORMAL HIGH (ref 70–99)
Potassium: 3.9 mmol/L (ref 3.5–5.1)
Sodium: 142 mmol/L (ref 135–145)

## 2020-07-15 MED ORDER — ENTRESTO 97-103 MG PO TABS: 1.0000 | ORAL_TABLET | Freq: Two times a day (BID) | ORAL | 11 refills | Status: DC

## 2020-07-15 MED ORDER — DAPAGLIFLOZIN PROPANEDIOL 10 MG PO TABS: 10.0000 mg | ORAL_TABLET | Freq: Every day | ORAL | 11 refills | Status: DC

## 2020-07-15 NOTE — Patient Instructions (Addendum)
START Farxiga 10 mg, one tab daily  INCREASE Entresto to 97/103 mg, one tab twice daily  You have been referred to [facility]-Electrophysiology  You have been referred to [facility] Endocrinology  You have been referred to [facility] today We will only contact you if something comes back abnormal or we need to make some changes. Otherwise no news is good news!   Labs needed in 7-10 days  Your physician recommends that you schedule a follow-up appointment in: 1 month with Dr Shirlee Latch  If you have any questions or concerns before your next appointment please send [place] a message through [place] or call our office at [phone].    TO LEAVE A MESSAGE FOR THE NURSE SELECT OPTION 2, PLEASE LEAVE A MESSAGE INCLUDING: . YOUR NAME . DATE OF BIRTH . CALL BACK NUMBER . REASON FOR CALL**this is important as we prioritize the call backs  YOU WILL RECEIVE A CALL BACK THE SAME DAY AS LONG AS YOU CALL BEFORE 4:00 PM

## 2020-07-16 NOTE — Addendum Note (Signed)
Encounter addended by: Laurey Morale, MD on: 07/16/2020 8:42 PM  Actions taken: Clinical Note Signed, Level of Service modified

## 2020-07-16 NOTE — Progress Notes (Signed)
PCP: Maximiano Coss, NP Cardiology: Dr. Johnsie Cancel HF Cardiology: Dr. Aundra Dubin  44 y.o. with history of CAD s/p multiple MIs and PCIs, ischemic cardiomyopathy, HTN, active smoking, and diabetes.  Patient has had multiple coronary interventions, fist in 2010.  His RCA is now chronically occluded, and he has had many stents in the LAD.  Echo in 1/18 showed EF 40%.  EF was down to 20-25% in 12/19.  Echo in 7/21 was reported as EF 25-30%, moderate LV enlargement, normal RV.  Of note, patient seen in urgent care in 7/21 and noted to have blood glucose > 400.  HgbA1c was 11.3. He was started on metformin at urgent care with new diagnosis of diabetes.     Given more frequent chest pain and dyspnea with moderate exertion, I took him for LHC/RHC in 10/21.  This showed low output HF with CI 1.48.  His LAD was totally occluded (long segment) with no collaterals, the RCA was totally occluded with R-R collaterals, and the LCx was patent.  He was admitted and started on milrinone, diuresed.  He was weaned off milrinone.  Lifevest was recommended but he refused.  He was seen by cardiac surgery, not a candidate for CABG.  We began LVAD evaluation (active smoker, not transplant candidate).  Echo in 10/21 showed EF 20-25% with severe LV dilation.  CPX (10/21) showed peak VO2 16, VE/VCO2 slope 37, RER 1.13 => moderate HF limitation.   Patient returns for followup of CHF and CAD.  He seems to be doing better, more calm/less anxious.  Has been going to church.  Was seen once in the ER with chest tightness while dancing with his fiance, had been exerting himself a lot.  Troponin and ECG negative, he was sent home.  He has not had chest pain prior to this or since. No dyspnea walking on flat ground. He takes his time walking up stairs and does not get short of breath unless he rushes.  No orthopnea/PND.  He has quit smoking. He thinks his glucose is doing better.    ECG (personally reviewed): NSR, LVH, QTc 470 msec.   Labs (7/21):  K 5, creatinine 1.11, glucose 464, hgbA1 11.3 Labs (9/21): LDL 175 Labs (10/21): K 3.9, creatinine 1.0, hgbA1c 12.2 => 11.5 Labs (11/21): K 3.7, creatinine 1.05  PMH: 1. CAD:  - BMS to LAD in 2010.  - DES to RCA on 2012 - DES to LCx and LAD in 3/71, complicated by dissection with overlapping DES to mid LAD.  - Anterior STEMI in 1/17 with DES to totally occluded LAD (stent thrombosis).  - 8/17 PCI to apical LAD, RCA noted to be totally occluded with collaterals.  - 3/18 anterior STEMI with PTCA to totally occluded LAD.  - Coronary angiography (10/21): LAD was totally occluded (long segment) with no collaterals, the RCA was totally occluded with R-R collaterals, and the LCx was patent 2. Chronic systolic CHF: Ischemic cardiomyopathy.   - Echo (1/18): EF 40%.  - Echo (12/19): EF 20-25% - Echo (7/21): EF 25-30%, moderate LV enlargement, mild LVH, RV normal.  - RHC (10/21): mean RA 6, PA 47/13, mean PCWP 20, CI 1.48, PVR 2.4 WU - CPX (10/21): peak VO2 16, VE/VCO2 slope 37, RER 1.13 => moderate HF limitation 3. HTN 4. H/o AVNRT 5. Smoking: Active smoker, 1-2 cigs/day.  - PFTs in 10/21 with minimal obstructive disease.  6. OSA: Uses CPAP 7. Type 2 diabetes: Diagnosed in 7/21 8. Carotid dopplers (10/21): 1-39% BICA stenosis.  9. PAD: ABIs (10/21) were mildly decreased.   Social History   Socioeconomic History  . Marital status: Significant Other    Spouse name: Not on file  . Number of children: 3  . Years of education: Not on file  . Highest education level: Not on file  Occupational History  . Occupation: Aeronautical engineer: [employer]  Tobacco Use  . Smoking status: Former Smoker    Packs/day: 1.00    Years: 24.00    Pack years: 24.00    Quit date: 11/03/2016    Years since quitting: 3.7  . Smokeless tobacco: Never Used  . Tobacco comment: quit on 03/15/16  Vaping Use  . Vaping Use: Never used  Substance and Sexual Activity  . Alcohol use: No  . Drug use: Yes     Types: Marijuana    Comment: last time 02/2016  . Sexual activity: Yes  Other Topics Concern  . Not on file  Social History Narrative   Lives in [place] by himself.  Sometimes stays with girlfriend in [place].  Does not routinely exercise.   Social Determinants of Health   Financial Resource Strain:   . Difficulty of Paying Living Expenses: Not on file  Food Insecurity:   . Worried About Charity fundraiser in the Last Year: Not on file  . Ran Out of Food in the Last Year: Not on file  Transportation Needs:   . Lack of Transportation (Medical): Not on file  . Lack of Transportation (Non-Medical): Not on file  Physical Activity:   . Days of Exercise per Week: Not on file  . Minutes of Exercise per Session: Not on file  Stress:   . Feeling of Stress : Not on file  Social Connections:   . Frequency of Communication with Friends and Family: Not on file  . Frequency of Social Gatherings with Friends and Family: Not on file  . Attends Religious Services: Not on file  . Active Member of Clubs or Organizations: Not on file  . Attends Archivist Meetings: Not on file  . Marital Status: Not on file  Intimate Partner Violence:   . Fear of Current or Ex-Partner: Not on file  . Emotionally Abused: Not on file  . Physically Abused: Not on file  . Sexually Abused: Not on file   Family History  Problem Relation Age of Onset  . Arrhythmia Mother        MOTHER LIVED TO BE 89  . Coronary artery disease Mother   . Heart attack Mother   . Hypertension Mother   . Coronary artery disease Father 71  . Heart disease Father   . Heart attack Father   . Heart attack Brother   . Stroke Neg Hx    ROS: All systems reviewed and negative except as per HPI.   Current Outpatient Medications  Medication Sig Dispense Refill  . acetaminophen (TYLENOL) 325 MG tablet Take 2 tablets (650 mg total) by mouth every 4 (four) hours as needed for headache or mild pain.    Marland Kitchen aspirin 81 MG EC tablet  Take 1 tablet (81 mg total) by mouth daily. 90 tablet 3  . atorvastatin (LIPITOR) 80 MG tablet TAKE 1 TABLET BY MOUTH EVERY DAY AT 6 PM (Patient taking differently: Take 80 mg by mouth daily at 6 PM. ) 90 tablet 1  . blood glucose meter kit and supplies Dispense based on patient and insurance preference. Use up to four times  daily as directed. (FOR ICD-10 E10.9, E11.9). 1 each 0  . buPROPion (WELLBUTRIN XL) 150 MG 24 hr tablet Take 1 tablet (150 mg total) by mouth daily. 90 tablet 3  . clotrimazole-betamethasone (LOTRISONE) cream Apply 1 application topically 2 (two) times daily. (Patient taking differently: Apply 1 application topically daily as needed (Rash). ) 30 g 0  . digoxin (LANOXIN) 0.125 MG tablet Take 1 tablet (125 mcg total) by mouth daily. Please make yearly appt with Dr. Johnsie Cancel for January 2022 for future refills. Thank you 1st attempt 90 tablet 0  . furosemide (LASIX) 40 MG tablet Take 1 tablet (40 mg total) by mouth daily. 90 tablet 3  . insulin glargine (LANTUS SOLOSTAR) 100 UNIT/ML Solostar Pen Inject 23 Units into the skin daily. 15 mL 11  . Insulin Pen Needle (PEN NEEDLES 3/16") 31G X 5 MM MISC Lantus 23 100 each 1  . metFORMIN (GLUCOPHAGE) 1000 MG tablet Take 1 tablet (1,000 mg total) by mouth 2 (two) times daily with a meal. 60 tablet 3  . nitroGLYCERIN (NITROSTAT) 0.4 MG SL tablet PLACE 1 TABLET UNDER TONGUE EVERY 5 MINS, UP TO 3 DOSES AS NEEDED FOR CHEST PAIN (Patient taking differently: Place 0.4 mg under the tongue every 5 (five) minutes as needed for chest pain. ) 25 tablet 2  . potassium chloride SA (KLOR-CON) 20 MEQ tablet Take 2 tablets (40 mEq total) by mouth daily. Please make yearly appt with Dr. Johnsie Cancel for January 2022 for future refills. 1st attempt 180 tablet 0  . spironolactone (ALDACTONE) 25 MG tablet Take 1 tablet (25 mg total) by mouth daily. 90 tablet 3  . triamcinolone cream (KENALOG) 0.1 % Apply 1 application topically 2 (two) times daily. (Patient taking  differently: Apply 1 application topically daily as needed (rash). ) 30 g 0  . dapagliflozin propanediol (FARXIGA) 10 MG TABS tablet Take 1 tablet (10 mg total) by mouth daily before breakfast. 30 tablet 11  . sacubitril-valsartan (ENTRESTO) 97-103 MG Take 1 tablet by mouth 2 (two) times daily. 60 tablet 11   No current facility-administered medications for this encounter.   BP 122/80   Pulse 89   Wt 121.6 kg (268 lb)   SpO2 99%   BMI 41.97 kg/m  General: NAD Neck: No JVD, no thyromegaly or thyroid nodule.  Lungs: Clear to auscultation bilaterally with normal respiratory effort. CV: Nondisplaced PMI.  Heart regular S1/S2, no S3/S4, no murmur.  No peripheral edema.  No carotid bruit.  Normal pedal pulses.  Abdomen: Soft, nontender, no hepatosplenomegaly, no distention.  Skin: Intact without lesions or rashes.  Neurologic: Alert and oriented x 3.  Psych: Normal affect. Extremities: No clubbing or cyanosis.  HEENT: Normal.   Assessment/Plan: 1. CAD: RCA known to be chronically occluded. He has had multiple PCIs to LAD. Repeat LHC in 10/21 done for recurrent angina demonstrated occluded RCA (known from past) and occluded pLAD with no collaterals. His LCx was patent. I do not think we have a revascularization option, multiple overlapping LAD stents, now occluded and there are no good surgical targets. He had an episode of chest pain recently with heavy exertion, but no other chest pain since last appointment.  - Continue ASA 81  - Continue Atorvastatin 80  2. Chronic systolic CHF: Ischemic cardiomyopathy. Echo in 10/21 with EF 20-25%, severe LV dilation, mildly decreased RV systolic function. RHC in 10/21 demonstrated elevated PCWP, mild pulmonary venous hypertension and low cardiac output with CI 1.4. Patient was transiently on milrinone but  weaned off. CPX in 10/21 showed peak VO2 16, VE/VCO2 slope 37, RER 1.13 => moderate HF limitation.  RHC and CPX do not match.  Symptomatically, he  seems more like the CPX.  He describes NYHA class II symptoms currently.   Today, he is not volume overloaded.  Despite low CO on RHC, he is not markedly symptomatic.  - Continue Lasix 40 mg daily.  - Increase Entresto to 97/103 bid with BMET today and in 10 days.  - Off Coreg with low output.  - Continue spironolactone 25 daily  - Continue digoxin 0.125 daily, check level.   - Start Farxiga 10 mg daily.    - He has had LVAD workup.  He had low CO on RHC though symptomatically does not seem that severe, CPX matches symptoms more with moderate HF limitation.  Needs better blood glucose control prior to even considering LVAD.  He may end up being a transplant candidate.  He has stopped smoking.  He will need to lose weight and get glucose under good control.  - He is not a CRT candidate. We recommended Lifevest but he refused.  He is willing to see Dr. Caryl Comes regarding ICD.  I will refer.  3. Type 2 diabetes: New diagnosis in 7/21 with hgbA1c 11.3. He is on metformin and Lantus.  Needs better control of HgbA1c to get LVAD or transplant.  - Needs endocrinology appointment, have referred.  - Check hgbA1c.  - Add Farxiga 10 mg daily.  4. OSA: Has sleep study coming up.   5. Smoking:  He recently quit.  PFTs with minimal obstructive disease.  - Continue Wellbutrin 6. HLD: Goal LDL < 70. He is on atorvastatin 80 but last LDL 175.  - Refer to lipid clinic for PCSK9 inhibitor.   Close followup, will see in 1 month.   Loralie Champagne 07/16/2020

## 2020-07-20 ENCOUNTER — Encounter: Payer: Self-pay | Admitting: Internal Medicine

## 2020-07-24 ENCOUNTER — Encounter (HOSPITAL_BASED_OUTPATIENT_CLINIC_OR_DEPARTMENT_OTHER): Payer: Medicare Other | Admitting: Cardiology

## 2020-07-24 ENCOUNTER — Ambulatory Visit: Payer: Medicare Other

## 2020-07-24 ENCOUNTER — Other Ambulatory Visit (HOSPITAL_COMMUNITY): Payer: Medicare Other

## 2020-07-27 ENCOUNTER — Ambulatory Visit (HOSPITAL_COMMUNITY)
Admission: RE | Admit: 2020-07-27 | Discharge: 2020-07-27 | Disposition: A | Discharge status: Home / Self Care | Payer: Medicare Other | Source: Ambulatory Visit | Attending: Cardiology | Admitting: Cardiology

## 2020-07-27 ENCOUNTER — Other Ambulatory Visit: Payer: Self-pay

## 2020-07-27 DIAGNOSIS — I5042 Chronic combined systolic (congestive) and diastolic (congestive) heart failure: Secondary | ICD-10-CM | POA: Insufficient documentation

## 2020-07-27 LAB — BASIC METABOLIC PANEL
Anion gap: 11 (ref 5–15)
BUN: 15 mg/dL (ref 6–20)
CO2: 24 mmol/L (ref 22–32)
Calcium: 9.1 mg/dL (ref 8.9–10.3)
Chloride: 107 mmol/L (ref 98–111)
Creatinine, Ser: 1.22 mg/dL (ref 0.61–1.24)
GFR, Estimated: >60 mL/min (ref >60)
Glucose, Bld: 120 mg/dL — ABNORMAL HIGH (ref 70–99)
Potassium: 4 mmol/L (ref 3.5–5.1)
Sodium: 142 mmol/L (ref 135–145)

## 2020-07-30 ENCOUNTER — Ambulatory Visit: Payer: Medicare Other

## 2020-07-30 DIAGNOSIS — IMO0002 Reserved for concepts with insufficient information to code with codable children: Secondary | ICD-10-CM | POA: Insufficient documentation

## 2020-07-30 NOTE — Progress Notes (Deleted)
Patient ID: Peter Russo                 DOB: 1975-11-25                    MRN: [id number]     HPI: Peter Russo is a 44 y.o. male patient referred to lipid clinic by ***. PMH is significant for NSTEMI, CAD, HTN, CHF (EF 25-30%), angina, AKI, obesity, DM (A1c 11.3), and tobacco abuse (reports he quit smoking). Current Medications: atorvastatin 80,  Intolerances:  Risk Factors: CAD, HTN, DM, hx of NSTEMI, tobacco LDL goal: <55  Diet:   Exercise:   Family History:   Social History:   Labs: TC 286, Trigs 362, HDL 39, LDL 175 (05/11/20 on atorvastatin 80)  Past Medical History:  Diagnosis Date  . Anxiety   . CAD S/P percutaneous coronary angioplasty    a. s/p BMS-mLAD 2010. b. DES to RCA 2012. c. 04/2015: DES to LCx and PCI to LAD c/b dissection with subsequent overlapping DES to mLAD - r/i for NSTEMI afterwards; d. 08/2015 Ant STEMI in setting of noncompliance->144mAD ISR (3.0x16 Synergy DES); e. 03/2016 PCI of apical LAD; f. 03/2016 Relook Cath: patent LAD stents, RCA 100p CTO-->Med Rx; g. 10/2016 Ant STEMI: PTCA 100p LAD.  .Marland KitchenChronic combined systolic and diastolic CHF (congestive heart failure) ([place]    a. 03/2016 Echo: EF 30-35%, Gr2 DD;  b. 08/2016 Echo: EF 40%.  . Depression   . Essential hypertension   . GAD (generalized anxiety disorder)   . GERD (gastroesophageal reflux disease)   . Hypercholesteremia   . Ischemic cardiomyopathy    a. prior EF 25-35 percent at cath, 35-40 percent by echo; b. 03/2016 Echo: EF 30-35%, diff HK, Gr2 DD; c. 08/2016 Echo: EF 40%, base/mid inferior/inferowseptal AK, mildly dil LA.  . Morbid obesity ([place]   . Pre-diabetes   . SVT (supraventricular tachycardia) (HCC) APRROX 10 YRS AGO   PRESUMED AVNRT, ECHO 12/07 WITH EF 65%, MILD LAE  . Tobacco abuse    a. 20 + pack years, quit 10/2016.    Current Outpatient Medications on File Prior to Visit  Medication Sig Dispense Refill  . acetaminophen (TYLENOL) 325 MG tablet Take 2 tablets (650 mg  total) by mouth every 4 (four) hours as needed for headache or mild pain.    .Marland Kitchenaspirin 81 MG EC tablet Take 1 tablet (81 mg total) by mouth daily. 90 tablet 3  . atorvastatin (LIPITOR) 80 MG tablet TAKE 1 TABLET BY MOUTH EVERY DAY AT 6 PM (Patient taking differently: Take 80 mg by mouth daily at 6 PM. ) 90 tablet 1  . blood glucose meter kit and supplies Dispense based on patient and insurance preference. Use up to four times daily as directed. (FOR ICD-10 E10.9, E11.9). 1 each 0  . buPROPion (WELLBUTRIN XL) 150 MG 24 hr tablet Take 1 tablet (150 mg total) by mouth daily. 90 tablet 3  . clotrimazole-betamethasone (LOTRISONE) cream Apply 1 application topically 2 (two) times daily. (Patient taking differently: Apply 1 application topically daily as needed (Rash). ) 30 g 0  . dapagliflozin propanediol (FARXIGA) 10 MG TABS tablet Take 1 tablet (10 mg total) by mouth daily before breakfast. 30 tablet 11  . digoxin (LANOXIN) 0.125 MG tablet Take 1 tablet (125 mcg total) by mouth daily. Please make yearly appt with Dr. NJohnsie Cancelfor January 2022 for future refills. Thank you 1st attempt 90 tablet 0  . furosemide (LASIX)  40 MG tablet Take 1 tablet (40 mg total) by mouth daily. 90 tablet 3  . insulin glargine (LANTUS SOLOSTAR) 100 UNIT/ML Solostar Pen Inject 23 Units into the skin daily. 15 mL 11  . Insulin Pen Needle (PEN NEEDLES 3/16") 31G X 5 MM MISC Lantus 23 100 each 1  . metFORMIN (GLUCOPHAGE) 1000 MG tablet Take 1 tablet (1,000 mg total) by mouth 2 (two) times daily with a meal. 60 tablet 3  . nitroGLYCERIN (NITROSTAT) 0.4 MG SL tablet PLACE 1 TABLET UNDER TONGUE EVERY 5 MINS, UP TO 3 DOSES AS NEEDED FOR CHEST PAIN (Patient taking differently: Place 0.4 mg under the tongue every 5 (five) minutes as needed for chest pain. ) 25 tablet 2  . potassium chloride SA (KLOR-CON) 20 MEQ tablet Take 2 tablets (40 mEq total) by mouth daily. Please make yearly appt with Dr. Johnsie Cancel for January 2022 for future refills.  1st attempt 180 tablet 0  . sacubitril-valsartan (ENTRESTO) 97-103 MG Take 1 tablet by mouth 2 (two) times daily. 60 tablet 11  . spironolactone (ALDACTONE) 25 MG tablet Take 1 tablet (25 mg total) by mouth daily. 90 tablet 3  . triamcinolone cream (KENALOG) 0.1 % Apply 1 application topically 2 (two) times daily. (Patient taking differently: Apply 1 application topically daily as needed (rash). ) 30 g 0   No current facility-administered medications on file prior to visit.    No Known Allergies  Assessment/Plan:  1. Hyperlipidemia -

## 2020-08-02 ENCOUNTER — Encounter: Payer: Self-pay | Admitting: Registered Nurse

## 2020-08-02 NOTE — Progress Notes (Signed)
New Patient Office Visit  Subjective:  Patient ID: Peter Russo, male    DOB: 1975-11-14  Age: 44 y.o. MRN: [id number]  CC:  Chief Complaint  Patient presents with  . New Patient (Initial Visit)    Patient states he is here to [facility] care and go over labs and diabetes.    HPI Peter Russo presents for visit to est care and review chronic conditions.  Pt has extensive CV history including combined chronic CHF, h/o CAD s/p angioplasty, ischemic cardiomyopathy. Managed through cardiology Dr. Aundra Dubin. Feels this is stable  T2DM: uncontrolled. Admits mixed lifestyle control. Hx of med noncompliance. Interested in discussing changes.  GAD/Depression: pt endorses much anxiety surrounding his medical conditions and history. Denies HI/SI but seems very motivated by these feelings to improve compliance with medications and lifestyle changes.  Past Medical History:  Diagnosis Date  . Anxiety   . CAD S/P percutaneous coronary angioplasty    a. s/p BMS-mLAD 2010. b. DES to RCA 2012. c. 04/2015: DES to LCx and PCI to LAD c/b dissection with subsequent overlapping DES to mLAD - r/i for NSTEMI afterwards; d. 08/2015 Ant STEMI in setting of noncompliance->183mAD ISR (3.0x16 Synergy DES); e. 03/2016 PCI of apical LAD; f. 03/2016 Relook Cath: patent LAD stents, RCA 100p CTO-->Med Rx; g. 10/2016 Ant STEMI: PTCA 100p LAD.  .Marland KitchenChronic combined systolic and diastolic CHF (congestive heart failure) ([place]    a. 03/2016 Echo: EF 30-35%, Gr2 DD;  b. 08/2016 Echo: EF 40%.  . Depression   . Essential hypertension   . GAD (generalized anxiety disorder)   . GERD (gastroesophageal reflux disease)   . Hypercholesteremia   . Ischemic cardiomyopathy    a. prior EF 25-35 percent at cath, 35-40 percent by echo; b. 03/2016 Echo: EF 30-35%, diff HK, Gr2 DD; c. 08/2016 Echo: EF 40%, base/mid inferior/inferowseptal AK, mildly dil LA.  . Morbid obesity ([place]   . Pre-diabetes   . SVT (supraventricular tachycardia)  (HCC) APRROX 10 YRS AGO   PRESUMED AVNRT, ECHO 12/07 WITH EF 65%, MILD LAE  . Tobacco abuse    a. 20 + pack years, quit 10/2016.    Past Surgical History:  Procedure Laterality Date  . CARDIAC CATHETERIZATION N/A 04/23/2015   Procedure: Left Heart Cath and Coronary Angiography;  Surgeon: PJosue Hector MD;  [place];  Service: Cardiovascular;  Laterality: N/A;  . CARDIAC CATHETERIZATION N/A 04/23/2015   Procedure: Coronary Stent Intervention;  Surgeon: MSherren Mocha MD;  [place] [place];  Service: Cardiovascular;  Laterality: N/A;  . CARDIAC CATHETERIZATION N/A 09/14/2015   Procedure: Left Heart Cath and Coronary Angiography;  Surgeon: HBelva Crome MD;  LAD 100%, CFX 10% ISR, RCA 100% (chronic), EF 25-35%  . CARDIAC CATHETERIZATION N/A 09/14/2015   Procedure: Coronary Stent Intervention;  Surgeon: HBelva Crome MD;  [facility];  Service: Cardiovascular;  Laterality: N/A; 3.0 x 16 mm Synergy DES LAD  . CARDIAC CATHETERIZATION  03/16/2016  . CARDIAC CATHETERIZATION N/A 03/16/2016   Procedure: Left Heart Cath and Coronary Angiography;  Surgeon: HBelva Crome MD;  [place] [facility];  Service: Cardiovascular;  Laterality: N/A;  . CARDIAC CATHETERIZATION N/A 03/16/2016   Procedure: Coronary Balloon Angioplasty;  Surgeon: HBelva Crome MD;  [place];  Service: Cardiovascular;  Laterality: N/A;  . CARDIAC CATHETERIZATION N/A 03/25/2016   Procedure: Left Heart Cath and Coronary Angiography;  Surgeon: Peter M JMartinique  MD;  Location: [place] [facility];  Service: Cardiovascular;  Laterality: N/A;  . CORONARY ANGIOPLASTY WITH STENT PLACEMENT  2010; 2013  . CORONARY BALLOON ANGIOPLASTY N/A 10/19/2016   Procedure: Coronary Balloon Angioplasty;  Surgeon: Jettie Booze, MD;  Location: [place] [facility];  Service: Cardiovascular;  Laterality: N/A;  . INTRAVASCULAR ULTRASOUND/IVUS N/A 10/19/2016   Procedure: Intravascular  Ultrasound/IVUS;  Surgeon: Jettie Booze, MD;  Location: [place] [facility];  Service: Cardiovascular;  Laterality: N/A;  . LEFT HEART CATH AND CORONARY ANGIOGRAPHY N/A 10/19/2016   Procedure: Left Heart Cath and Coronary Angiography;  Surgeon: Jettie Booze, MD;  Location: [place] [facility];  Service: Cardiovascular;  Laterality: N/A;  . RIGHT/LEFT HEART CATH AND CORONARY ANGIOGRAPHY N/A 05/21/2020   Procedure: RIGHT/LEFT HEART CATH AND CORONARY ANGIOGRAPHY;  Surgeon: Larey Dresser, MD;  Location: [place] [facility];  Service: Cardiovascular;  Laterality: N/A;    Family History  Problem Relation Age of Onset  . Arrhythmia Mother        MOTHER LIVED TO BE 89  . Coronary artery disease Mother   . Heart attack Mother   . Hypertension Mother   . Coronary artery disease Father 42  . Heart disease Father   . Heart attack Father   . Heart attack Brother   . Stroke Neg Hx     Social History   Socioeconomic History  . Marital status: Significant Other    Spouse name: Not on file  . Number of children: 3  . Years of education: Not on file  . Highest education level: Not on file  Occupational History  . Occupation: Aeronautical engineer: [employer]  Tobacco Use  . Smoking status: Former Smoker    Packs/day: 1.00    Years: 24.00    Pack years: 24.00    Quit date: 11/03/2016    Years since quitting: 3.7  . Smokeless tobacco: Never Used  . Tobacco comment: quit on 03/15/16  Vaping Use  . Vaping Use: Never used  Substance and Sexual Activity  . Alcohol use: No  . Drug use: Yes    Types: Marijuana    Comment: last time 02/2016  . Sexual activity: Yes  Other Topics Concern  . Not on file  Social History Narrative   Lives in [place] by himself.  Sometimes stays with girlfriend in [place].  Does not routinely exercise.   Social Determinants of Health   Financial Resource Strain: Not on file  Food Insecurity: Not on file  Transportation Needs: Not on file   Physical Activity: Not on file  Stress: Not on file  Social Connections: Not on file  Intimate Partner Violence: Not on file    ROS Review of Systems  Constitutional: Negative.   HENT: Negative.   Eyes: Negative.   Respiratory: Negative.   Cardiovascular: Negative.   Gastrointestinal: Negative.   Genitourinary: Negative.   Musculoskeletal: Negative.   Skin: Negative.   Neurological: Negative.   Psychiatric/Behavioral: Negative.     Objective:   Today's Vitals: BP 134/79   Pulse 87   Temp 97.9 F (36.6 C) (Temporal)   Resp 18   Ht 5' 7" (1.702 m)   Wt 253 lb 12.8 oz (115.1 kg)   SpO2 97%   BMI 39.75 kg/m   Physical Exam Vitals and nursing note reviewed.  Constitutional:      General: He is not in acute distress.    Appearance: Normal appearance. He is  obese. He is not ill-appearing, toxic-appearing or diaphoretic.  Cardiovascular:     Rate and Rhythm: Normal rate and regular rhythm.     Heart sounds: No murmur heard. No friction rub. No gallop.   Pulmonary:     Effort: Pulmonary effort is normal. No respiratory distress.     Breath sounds: Normal breath sounds. No stridor. No wheezing, rhonchi or rales.  Chest:     Chest wall: No tenderness.  Skin:    General: Skin is warm and dry.     Capillary Refill: Capillary refill takes 2 to 3 seconds.  Neurological:     General: No focal deficit present.     Mental Status: He is alert and oriented to person, place, and time. Mental status is at baseline.  Psychiatric:        Attention and Perception: Attention and perception normal.        Mood and Affect: Affect normal. Mood is anxious.        Speech: Speech normal.        Behavior: Behavior normal.        Thought Content: Thought content normal.        Cognition and Memory: Cognition and memory normal.        Judgment: Judgment normal.     Assessment & Plan:   Problem List Items Addressed This Visit      Cardiovascular and Mediastinum   CHF (congestive  heart failure) (HCC)     Other   GAD (generalized anxiety disorder)   Morbid obesity ([place])   H/O medication noncompliance    Other Visit Diagnoses    Type 2 diabetes mellitus with hyperglycemia, without long-term current use of insulin ([place])    -  Primary   Encounter to establish care          Outpatient Encounter Medications as of 06/02/2020  Medication Sig  . acetaminophen (TYLENOL) 325 MG tablet Take 2 tablets (650 mg total) by mouth every 4 (four) hours as needed for headache or mild pain.  Marland Kitchen aspirin 81 MG EC tablet Take 1 tablet (81 mg total) by mouth daily.  Marland Kitchen atorvastatin (LIPITOR) 80 MG tablet TAKE 1 TABLET BY MOUTH EVERY DAY AT 6 PM (Patient taking differently: Take 80 mg by mouth daily at 6 PM. )  . blood glucose meter kit and supplies Dispense based on patient and insurance preference. Use up to four times daily as directed. (FOR ICD-10 E10.9, E11.9).  Marland Kitchen buPROPion (WELLBUTRIN XL) 150 MG 24 hr tablet Take 1 tablet (150 mg total) by mouth daily.  . clotrimazole-betamethasone (LOTRISONE) cream Apply 1 application topically 2 (two) times daily. (Patient taking differently: Apply 1 application topically daily as needed (Rash). )  . furosemide (LASIX) 40 MG tablet Take 1 tablet (40 mg total) by mouth daily.  . insulin glargine (LANTUS SOLOSTAR) 100 UNIT/ML Solostar Pen Inject 23 Units into the skin daily.  . Insulin Pen Needle (PEN NEEDLES 3/16") 31G X 5 MM MISC Lantus 23  . nitroGLYCERIN (NITROSTAT) 0.4 MG SL tablet PLACE 1 TABLET UNDER TONGUE EVERY 5 MINS, UP TO 3 DOSES AS NEEDED FOR CHEST PAIN (Patient taking differently: Place 0.4 mg under the tongue every 5 (five) minutes as needed for chest pain. )  . spironolactone (ALDACTONE) 25 MG tablet Take 1 tablet (25 mg total) by mouth daily.  Marland Kitchen triamcinolone cream (KENALOG) 0.1 % Apply 1 application topically 2 (two) times daily. (Patient taking differently: Apply 1 application topically daily as needed (  rash). )  . [DISCONTINUED]  antiseptic oral rinse (BIOTENE) LIQD 1 application by Mouth Rinse route daily as needed for dry mouth.  . [DISCONTINUED] digoxin (LANOXIN) 0.125 MG tablet Take 1 tablet (0.125 mg total) by mouth daily.  . [DISCONTINUED] metFORMIN (GLUCOPHAGE) 500 MG tablet Take 1 tablet (500 mg total) by mouth 2 (two) times daily with a meal.  . [DISCONTINUED] potassium chloride SA (KLOR-CON) 20 MEQ tablet Take 2 tablets (40 mEq total) by mouth daily.  . [DISCONTINUED] sacubitril-valsartan (ENTRESTO) 24-26 MG Take 1 tablet by mouth 2 (two) times daily.   No facility-administered encounter medications on file as of 06/02/2020.    Follow-up: No follow-ups on file.   PLAN  Discussed lifestyle changes and ideal compliance  Discussed importance of specialist follow up  Discussed other options for care and further steps we can take  Patient encouraged to call clinic with any questions, comments, or concerns.  I spent 41 minutes with this patient, more than 50% of which was spent counseling and/or educating.  Maximiano Coss, NP

## 2020-08-05 ENCOUNTER — Ambulatory Visit: Payer: Medicare Other | Admitting: Internal Medicine

## 2020-08-09 NOTE — Progress Notes (Signed)
Patient ID: Peter Russo                 DOB: 1975/09/27                    MRN: [id number]     HPI: Peter Russo is a 44 y.o. male patient referred to lipid clinic by Dr. Aundra Dubin. PMH is significant for CAD s/p multiple MIs and PCIs, ischemic cardiomyopathy, HTN, former smoker, diabetes, OSA-uses CPAP, PAD, RCA chronically occluded, and has many stents in the LAD. Chronic systolic CHF w/Echo in 77/93 showed EF 20-25% with severe LV dilation, following in advanced CHF clinic.  Patient presents today in good spirits. Reports missing 1-2 doses of atorvastatin per week particularly on the weekends. Denies any side effects including myalgias. Discussed diet and exercise (see below).   Current Medications: atorvastatin 80 mg daily Intolerances: none  Risk Factors: multiple MI's and PCIs, CAD, CHF, HTN, HLD, diabetes, former smoker, significant Fhx of heart disease LDL goal: <55 mg/dL  Diet:  -Breakfast: [place], bacon and egg sandwich; bowl of cereal  -Lunch: grilled chicken sandwich  -Dinner: cooks for family which can include fried food -Drinks: pepsi zeros  Exercise: Gym during the summer time, not exercising/gym during the winter time to prevent COVID    Family History: Mother with CAD, heart attack, arrhythmia, HTN; Father with CAD, heart disease, heart attack; Brother with heart attack  Social History: former smoker (quit 2017)  Labs: 05/11/20: LDL 175, TC 286, TG 362, HDL 39 (atorvastatin 80 mg daily)  Past Medical History:  Diagnosis Date  . Anxiety   . CAD S/P percutaneous coronary angioplasty    a. s/p BMS-mLAD 2010. b. DES to RCA 2012. c. 04/2015: DES to LCx and PCI to LAD c/b dissection with subsequent overlapping DES to mLAD - r/i for NSTEMI afterwards; d. 08/2015 Ant STEMI in setting of noncompliance->148mAD ISR (3.0x16 Synergy DES); e. 03/2016 PCI of apical LAD; f. 03/2016 Relook Cath: patent LAD stents, RCA 100p CTO-->Med Rx; g. 10/2016 Ant STEMI: PTCA 100p LAD.  .Marland Kitchen Chronic combined systolic and diastolic CHF (congestive heart failure) ([place]    a. 03/2016 Echo: EF 30-35%, Gr2 DD;  b. 08/2016 Echo: EF 40%.  . Depression   . Essential hypertension   . GAD (generalized anxiety disorder)   . GERD (gastroesophageal reflux disease)   . Hypercholesteremia   . Ischemic cardiomyopathy    a. prior EF 25-35 percent at cath, 35-40 percent by echo; b. 03/2016 Echo: EF 30-35%, diff HK, Gr2 DD; c. 08/2016 Echo: EF 40%, base/mid inferior/inferowseptal AK, mildly dil LA.  . Morbid obesity ([place]   . Pre-diabetes   . SVT (supraventricular tachycardia) (HCC) APRROX 10 YRS AGO   PRESUMED AVNRT, ECHO 12/07 WITH EF 65%, MILD LAE  . Tobacco abuse    a. 20 + pack years, quit 10/2016.    Current Outpatient Medications on File Prior to Visit  Medication Sig Dispense Refill  . acetaminophen (TYLENOL) 325 MG tablet Take 2 tablets (650 mg total) by mouth every 4 (four) hours as needed for headache or mild pain.    .Marland Kitchenaspirin 81 MG EC tablet Take 1 tablet (81 mg total) by mouth daily. 90 tablet 3  . atorvastatin (LIPITOR) 80 MG tablet TAKE 1 TABLET BY MOUTH EVERY DAY AT 6 PM (Patient taking differently: Take 80 mg by mouth daily at 6 PM. ) 90 tablet 1  . blood glucose meter kit and supplies Dispense based  on patient and insurance preference. Use up to four times daily as directed. (FOR ICD-10 E10.9, E11.9). 1 each 0  . buPROPion (WELLBUTRIN XL) 150 MG 24 hr tablet Take 1 tablet (150 mg total) by mouth daily. 90 tablet 3  . clotrimazole-betamethasone (LOTRISONE) cream Apply 1 application topically 2 (two) times daily. (Patient taking differently: Apply 1 application topically daily as needed (Rash). ) 30 g 0  . dapagliflozin propanediol (FARXIGA) 10 MG TABS tablet Take 1 tablet (10 mg total) by mouth daily before breakfast. 30 tablet 11  . digoxin (LANOXIN) 0.125 MG tablet Take 1 tablet (125 mcg total) by mouth daily. Please make yearly appt with Dr. Johnsie Cancel for January 2022 for future  refills. Thank you 1st attempt 90 tablet 0  . furosemide (LASIX) 40 MG tablet Take 1 tablet (40 mg total) by mouth daily. 90 tablet 3  . insulin glargine (LANTUS SOLOSTAR) 100 UNIT/ML Solostar Pen Inject 23 Units into the skin daily. 15 mL 11  . Insulin Pen Needle (PEN NEEDLES 3/16") 31G X 5 MM MISC Lantus 23 100 each 1  . metFORMIN (GLUCOPHAGE) 1000 MG tablet Take 1 tablet (1,000 mg total) by mouth 2 (two) times daily with a meal. 60 tablet 3  . nitroGLYCERIN (NITROSTAT) 0.4 MG SL tablet PLACE 1 TABLET UNDER TONGUE EVERY 5 MINS, UP TO 3 DOSES AS NEEDED FOR CHEST PAIN (Patient taking differently: Place 0.4 mg under the tongue every 5 (five) minutes as needed for chest pain. ) 25 tablet 2  . potassium chloride SA (KLOR-CON) 20 MEQ tablet Take 2 tablets (40 mEq total) by mouth daily. Please make yearly appt with Dr. Johnsie Cancel for January 2022 for future refills. 1st attempt 180 tablet 0  . sacubitril-valsartan (ENTRESTO) 97-103 MG Take 1 tablet by mouth 2 (two) times daily. 60 tablet 11  . spironolactone (ALDACTONE) 25 MG tablet Take 1 tablet (25 mg total) by mouth daily. 90 tablet 3  . triamcinolone cream (KENALOG) 0.1 % Apply 1 application topically 2 (two) times daily. (Patient taking differently: Apply 1 application topically daily as needed (rash). ) 30 g 0   No current facility-administered medications on file prior to visit.    No Known Allergies  Assessment/Plan:  1. Hyperlipidemia - LDL above goal <55 mg/dL due to progressive heart disease. Medication adherence appears fair with atorvastatin, however LDL remains elevated. Discussed Repatha's clinical benefits and potential side effects, and demonstrated proper injection technique with teach back method. Will send PA for Repatha 140 mg subcutaneously every 2 weeks. Once approved, will contact patient once Repatha is ready for pick up. Continue atorvastatin 50m daily. Extensively discussed improving diet and encouraged patient to aim for a diet  full of vegetables, fruit and lean meats (chicken, t[place] fish) and to limit carbs (bread, pasta, sugar, rice) and red meat consumption. Encouraged patient to exercise 20-30 minutes daily with the goal of 150 minutes per week. Patient verbalized understanding. Scheduled fasting lipid panel and LFTs in 3 months.  ILorel Monaco PharmD, [id number] [place] [id number]: [phone] Fax: [phone]

## 2020-08-11 ENCOUNTER — Other Ambulatory Visit: Payer: Self-pay

## 2020-08-11 ENCOUNTER — Ambulatory Visit (INDEPENDENT_AMBULATORY_CARE_PROVIDER_SITE_OTHER): Payer: Medicare Other | Admitting: Pharmacist

## 2020-08-11 DIAGNOSIS — E785 Hyperlipidemia, unspecified: Secondary | ICD-10-CM

## 2020-08-11 NOTE — Patient Instructions (Addendum)
Nice to see you today!  Keep up the good work with diet and exercise. Aim for a diet full of vegetables, fruit and lean meats (chicken, [place], fish). Try to limit carbs (bread, pasta, sugar, rice) and red meat consumption.  Your goal LDL is <55 mg/dL, you're currently at 239 mg/dL  Medication Changes: Once approved, will call you when Repatha is ready for pickup from your pharmacy.  You will inject Repatha 140 mg into the lower abdomen every 2 weeks (14 days)  Continue atorvastatin 80 mg daily in the evenings  Please give [place] a call at [phone] with any questions or concerns  For PCSK9i, inject once every other week (any day of the week that works for you) into the fatty skin of stomach, upper outer thigh or back of the arm. Clean the site with soap and warm water or an alcohol pad. Keep the medication in the fridge until you are ready to give your dose, then take it out and let warm up to room temperature for 30-60 mins.

## 2020-08-12 ENCOUNTER — Telehealth: Payer: Self-pay | Admitting: Pharmacist

## 2020-08-12 DIAGNOSIS — E785 Hyperlipidemia, unspecified: Secondary | ICD-10-CM

## 2020-08-12 MED ORDER — REPATHA SURECLICK 140 MG/ML ~~LOC~~ SOAJ: 1.0000 "pen " | SUBCUTANEOUS | 11 refills | Status: DC

## 2020-08-12 MED ORDER — REPATHA SURECLICK 140 MG/ML ~~LOC~~ SOAJ: 2.0000 "pen " | SUBCUTANEOUS | 11 refills | Status: DC

## 2020-08-12 NOTE — Addendum Note (Signed)
Addended by: Fabio Neighbors A on: 08/12/2020 09:03 AM   Modules accepted: Orders

## 2020-08-12 NOTE — Telephone Encounter (Signed)
Contacted patient regarding hyperlipidemia management. Repatha PA has been approved until 02/09/21. Prescription sent to pharmacy and fasting labs scheduled in 3 months. Informed patient to contact [facility] if he has any concerns or questions. Patient verbalized understanding.

## 2020-08-12 NOTE — Addendum Note (Signed)
Addended by: Fabio Neighbors A on: 08/12/2020 09:21 AM   Modules accepted: Orders

## 2020-08-16 IMAGING — DX DG CHEST 1V PORT
1 series · 1 of 1 positions shown · non-contrast
Comparison: 05/28/2018

CLINICAL DATA: Shortness of breath and history of congestive heart
failure.

EXAM:
PORTABLE CHEST 1 VIEW

[chest ap]
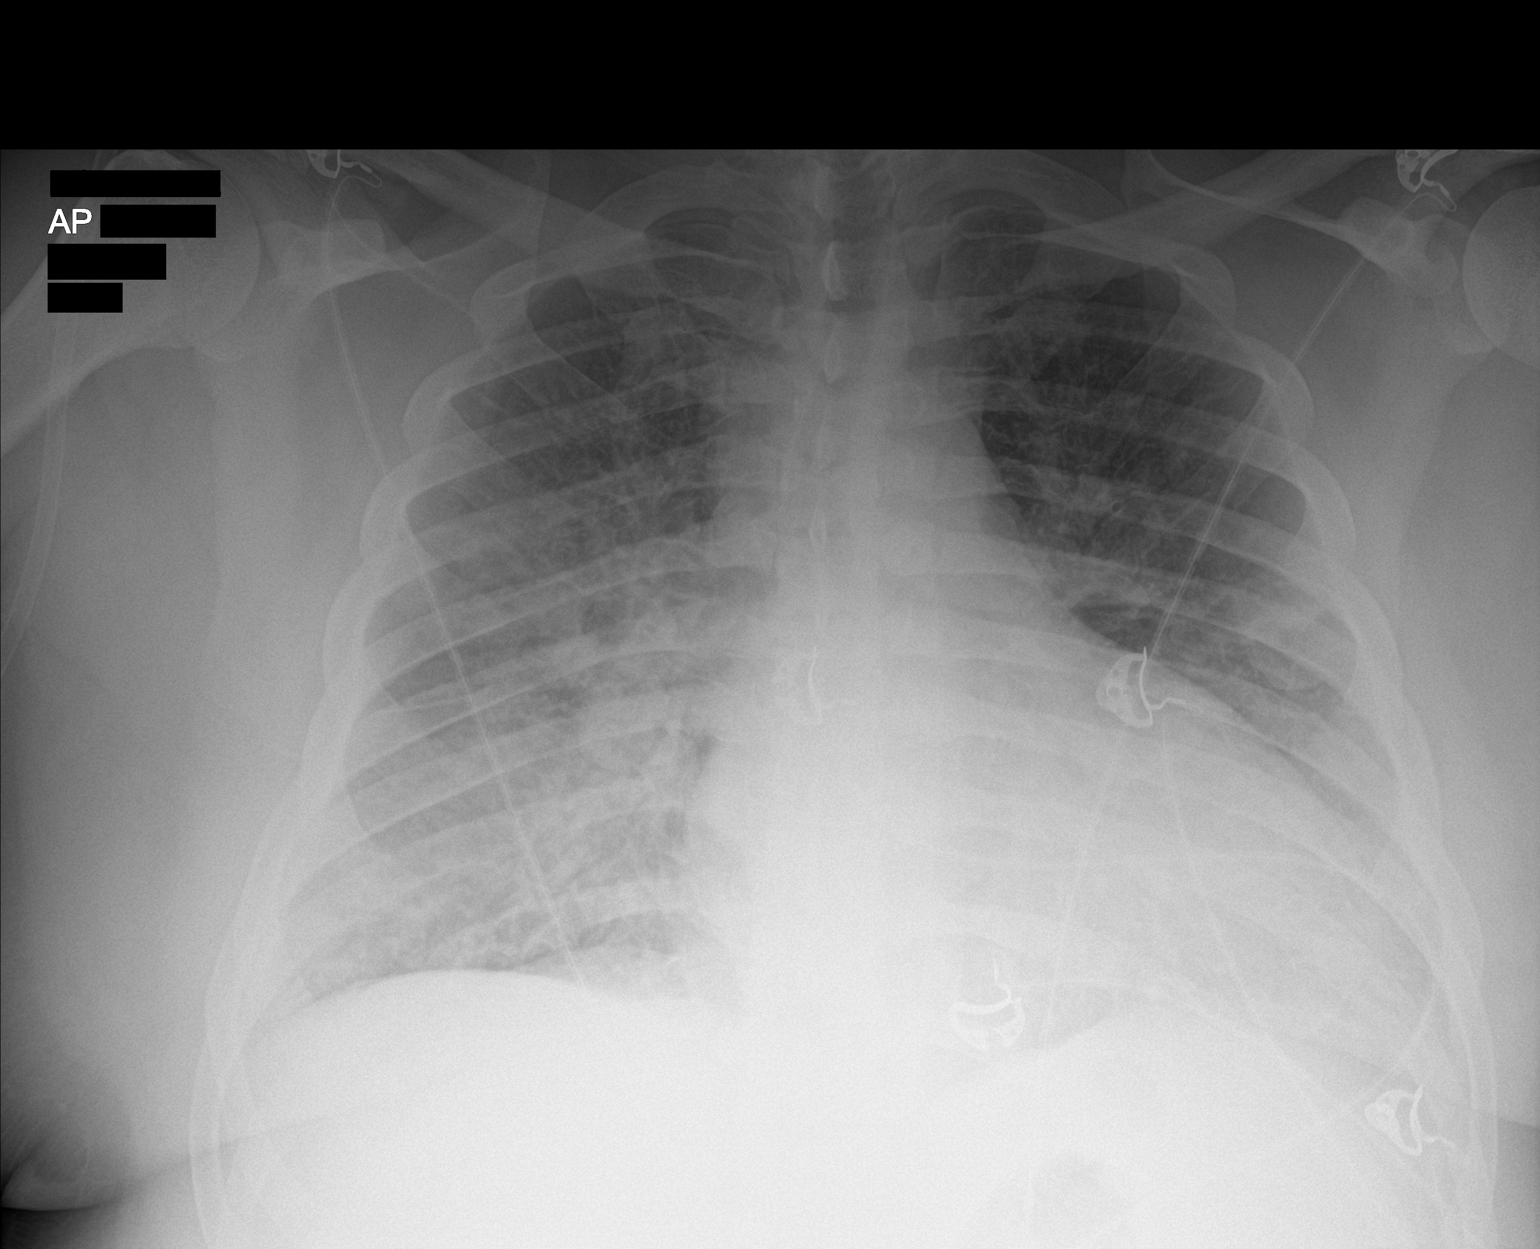

[1 of 1 positions shown; findings below may reference images not displayed]

FINDINGS: Stable cardiac enlargement. Increase in pulmonary vascular and
interstitial prominence is consistent with acute CHF. No significant
pleural fluid identified. No pneumothorax. No focal airspace
disease.
IMPRESSION: Cardiomegaly with acute congestive heart failure.

## 2020-08-18 ENCOUNTER — Encounter (HOSPITAL_COMMUNITY): Payer: Self-pay | Admitting: Cardiology

## 2020-08-18 ENCOUNTER — Ambulatory Visit (HOSPITAL_COMMUNITY)
Admission: RE | Admit: 2020-08-18 | Discharge: 2020-08-18 | Disposition: A | Discharge status: Home / Self Care | Payer: Medicare Other | Source: Ambulatory Visit | Attending: Cardiology | Admitting: Cardiology

## 2020-08-18 ENCOUNTER — Other Ambulatory Visit: Payer: Self-pay

## 2020-08-18 VITALS — BP 160/90 | HR 88 | Wt 264.4 lb

## 2020-08-18 DIAGNOSIS — G4733 Obstructive sleep apnea (adult) (pediatric): Secondary | ICD-10-CM | POA: Diagnosis not present

## 2020-08-18 DIAGNOSIS — Z955 Presence of coronary angioplasty implant and graft: Secondary | ICD-10-CM | POA: Insufficient documentation

## 2020-08-18 DIAGNOSIS — Z8249 Family history of ischemic heart disease and other diseases of the circulatory system: Secondary | ICD-10-CM | POA: Diagnosis not present

## 2020-08-18 DIAGNOSIS — I5022 Chronic systolic (congestive) heart failure: Secondary | ICD-10-CM | POA: Insufficient documentation

## 2020-08-18 DIAGNOSIS — I255 Ischemic cardiomyopathy: Secondary | ICD-10-CM | POA: Insufficient documentation

## 2020-08-18 DIAGNOSIS — I110 Hypertensive heart disease with heart failure: Secondary | ICD-10-CM | POA: Insufficient documentation

## 2020-08-18 DIAGNOSIS — E785 Hyperlipidemia, unspecified: Secondary | ICD-10-CM | POA: Diagnosis not present

## 2020-08-18 DIAGNOSIS — I2510 Atherosclerotic heart disease of native coronary artery without angina pectoris: Secondary | ICD-10-CM | POA: Insufficient documentation

## 2020-08-18 DIAGNOSIS — Z79899 Other long term (current) drug therapy: Secondary | ICD-10-CM | POA: Diagnosis not present

## 2020-08-18 DIAGNOSIS — Z7982 Long term (current) use of aspirin: Secondary | ICD-10-CM | POA: Diagnosis not present

## 2020-08-18 DIAGNOSIS — Z794 Long term (current) use of insulin: Secondary | ICD-10-CM | POA: Diagnosis not present

## 2020-08-18 DIAGNOSIS — E119 Type 2 diabetes mellitus without complications: Secondary | ICD-10-CM | POA: Insufficient documentation

## 2020-08-18 DIAGNOSIS — Z87891 Personal history of nicotine dependence: Secondary | ICD-10-CM | POA: Diagnosis not present

## 2020-08-18 DIAGNOSIS — I252 Old myocardial infarction: Secondary | ICD-10-CM | POA: Insufficient documentation

## 2020-08-18 DIAGNOSIS — I5043 Acute on chronic combined systolic (congestive) and diastolic (congestive) heart failure: Secondary | ICD-10-CM | POA: Diagnosis present

## 2020-08-18 LAB — BASIC METABOLIC PANEL
Anion gap: 8 (ref 5–15)
BUN: 11 mg/dL (ref 6–20)
CO2: 27 mmol/L (ref 22–32)
Calcium: 9.2 mg/dL (ref 8.9–10.3)
Chloride: 106 mmol/L (ref 98–111)
Creatinine, Ser: 1.16 mg/dL (ref 0.61–1.24)
GFR, Estimated: >60 mL/min (ref >60)
Glucose, Bld: 119 mg/dL — ABNORMAL HIGH (ref 70–99)
Potassium: 4.1 mmol/L (ref 3.5–5.1)
Sodium: 141 mmol/L (ref 135–145)

## 2020-08-18 LAB — DIGOXIN LEVEL: Digoxin Level: 0.2 ng/mL — ABNORMAL LOW (ref 0.8–2.0)

## 2020-08-18 MED ORDER — CARVEDILOL 3.125 MG PO TABS: 3.1250 mg | ORAL_TABLET | Freq: Two times a day (BID) | ORAL | 3 refills | Status: DC

## 2020-08-18 NOTE — Patient Instructions (Addendum)
Start Coreg 3.125 mg Twice daily  Labs done today, your results will be available in MyChart, we will contact you for abnormal readings  Please follow up with our heart failure pharmacist in 2 weeks for medication titration  Your physician recommends that you schedule a follow-up appointment in: 6 week  If you have any questions or concerns before your next appointment please send [place] a message through [place] or call our office at [phone].    TO LEAVE A MESSAGE FOR THE NURSE SELECT OPTION 2, PLEASE LEAVE A MESSAGE INCLUDING: . YOUR NAME . DATE OF BIRTH . CALL BACK NUMBER . REASON FOR CALL**this is important as we prioritize the call backs  YOU WILL RECEIVE A CALL BACK THE SAME DAY AS LONG AS YOU CALL BEFORE 4:00 PM

## 2020-08-19 NOTE — Progress Notes (Signed)
PCP: Maximiano Coss, NP Cardiology: Dr. Johnsie Cancel HF Cardiology: Dr. Aundra Dubin  45 y.o. with history of CAD s/p multiple MIs and PCIs, ischemic cardiomyopathy, HTN, active smoking, and diabetes.  Patient has had multiple coronary interventions, fist in 2010.  His RCA is now chronically occluded, and he has had many stents in the LAD.  Echo in 1/18 showed EF 40%.  EF was down to 20-25% in 12/19.  Echo in 7/21 was reported as EF 25-30%, moderate LV enlargement, normal RV.  Of note, patient seen in urgent care in 7/21 and noted to have blood glucose > 400.  HgbA1c was 11.3. He was started on metformin at urgent care with new diagnosis of diabetes.     Given more frequent chest pain and dyspnea with moderate exertion, I took him for LHC/RHC in 10/21.  This showed low output HF with CI 1.48.  His LAD was totally occluded (long segment) with no collaterals, the RCA was totally occluded with R-R collaterals, and the LCx was patent.  He was admitted and started on milrinone, diuresed.  He was weaned off milrinone.  Lifevest was recommended but he refused.  He was seen by cardiac surgery, not a candidate for CABG.  We began LVAD evaluation (active smoker, not transplant candidate).  Echo in 10/21 showed EF 20-25% with severe LV dilation.  CPX (10/21) showed peak VO2 16, VE/VCO2 slope 37, RER 1.13 => moderate HF limitation.   Patient returns for followup of CHF and CAD.  He is doing better overall.  No orthopnea/PND.  Weight down 4 lbs.  No dyspnea walking on flat ground, dyspnea only with heavy exertion.  No chest pain.  Has sleep study and appt with Dr Caryl Comes for ICD scheduled. He is not smoking.     Labs (7/21): K 5, creatinine 1.11, glucose 464, hgbA1 11.3 Labs (9/21): LDL 175 Labs (10/21): K 3.9, creatinine 1.0, hgbA1c 12.2 => 11.5 Labs (11/21): K 3.7, creatinine 1.05 Labs (12/21): K 4, creatinine 1.22  PMH: 1. CAD:  - BMS to LAD in 2010.  - DES to RCA on 2012 - DES to LCx and LAD in 1/61, complicated by  dissection with overlapping DES to mid LAD.  - Anterior STEMI in 1/17 with DES to totally occluded LAD (stent thrombosis).  - 8/17 PCI to apical LAD, RCA noted to be totally occluded with collaterals.  - 3/18 anterior STEMI with PTCA to totally occluded LAD.  - Coronary angiography (10/21): LAD was totally occluded (long segment) with no collaterals, the RCA was totally occluded with R-R collaterals, and the LCx was patent 2. Chronic systolic CHF: Ischemic cardiomyopathy.   - Echo (1/18): EF 40%.  - Echo (12/19): EF 20-25% - Echo (7/21): EF 25-30%, moderate LV enlargement, mild LVH, RV normal.  - RHC (10/21): mean RA 6, PA 47/13, mean PCWP 20, CI 1.48, PVR 2.4 WU - CPX (10/21): peak VO2 16, VE/VCO2 slope 37, RER 1.13 => moderate HF limitation 3. HTN 4. H/o AVNRT 5. Smoking: Active smoker, 1-2 cigs/day.  - PFTs in 10/21 with minimal obstructive disease.  6. OSA: Uses CPAP 7. Type 2 diabetes: Diagnosed in 7/21 8. Carotid dopplers (10/21): 1-39% BICA stenosis.  9. PAD: ABIs (10/21) were mildly decreased.   Social History   Socioeconomic History  . Marital status: Significant Other    Spouse name: Not on file  . Number of children: 3  . Years of education: Not on file  . Highest education level: Not on file  Occupational  History  . Occupation: Aeronautical engineer: [employer]  Tobacco Use  . Smoking status: Former Smoker    Packs/day: 1.00    Years: 24.00    Pack years: 24.00    Quit date: 11/03/2016    Years since quitting: 3.7  . Smokeless tobacco: Never Used  . Tobacco comment: quit on 03/15/16  Vaping Use  . Vaping Use: Never used  Substance and Sexual Activity  . Alcohol use: No  . Drug use: Yes    Types: Marijuana    Comment: last time 02/2016  . Sexual activity: Yes  Other Topics Concern  . Not on file  Social History Narrative   Lives in [place] by himself.  Sometimes stays with girlfriend in [place].  Does not routinely exercise.   Social Determinants of  Health   Financial Resource Strain: Not on file  Food Insecurity: Not on file  Transportation Needs: Not on file  Physical Activity: Not on file  Stress: Not on file  Social Connections: Not on file  Intimate Partner Violence: Not on file   Family History  Problem Relation Age of Onset  . Arrhythmia Mother        MOTHER LIVED TO BE 89  . Coronary artery disease Mother   . Heart attack Mother   . Hypertension Mother   . Coronary artery disease Father 60  . Heart disease Father   . Heart attack Father   . Heart attack Brother   . Stroke Neg Hx    ROS: All systems reviewed and negative except as per HPI.   Current Outpatient Medications  Medication Sig Dispense Refill  . acetaminophen (TYLENOL) 325 MG tablet Take 2 tablets (650 mg total) by mouth every 4 (four) hours as needed for headache or mild pain.    Marland Kitchen aspirin 81 MG EC tablet Take 1 tablet (81 mg total) by mouth daily. 90 tablet 3  . atorvastatin (LIPITOR) 80 MG tablet TAKE 1 TABLET BY MOUTH EVERY DAY AT 6 PM 90 tablet 1  . blood glucose meter kit and supplies Dispense based on patient and insurance preference. Use up to four times daily as directed. (FOR ICD-10 E10.9, E11.9). 1 each 0  . buPROPion (WELLBUTRIN XL) 150 MG 24 hr tablet Take 1 tablet (150 mg total) by mouth daily. 90 tablet 3  . carvedilol (COREG) 3.125 MG tablet Take 1 tablet (3.125 mg total) by mouth 2 (two) times daily. 180 tablet 3  . clotrimazole-betamethasone (LOTRISONE) cream Apply 1 application topically 2 (two) times daily. (Patient taking differently: Apply 1 application topically daily as needed (Rash).) 30 g 0  . dapagliflozin propanediol (FARXIGA) 10 MG TABS tablet Take 1 tablet (10 mg total) by mouth daily before breakfast. 30 tablet 11  . digoxin (LANOXIN) 0.125 MG tablet Take 1 tablet (125 mcg total) by mouth daily. Please make yearly appt with Dr. Johnsie Cancel for January 2022 for future refills. Thank you 1st attempt 90 tablet 0  . Evolocumab (REPATHA  SURECLICK) 373 MG/ML SOAJ Inject 1 pen into the skin every 14 (fourteen) days. 2 mL 11  . furosemide (LASIX) 40 MG tablet Take 1 tablet (40 mg total) by mouth daily. 90 tablet 3  . insulin glargine (LANTUS SOLOSTAR) 100 UNIT/ML Solostar Pen Inject 23 Units into the skin daily. 15 mL 11  . Insulin Pen Needle (PEN NEEDLES 3/16") 31G X 5 MM MISC Lantus 23 100 each 1  . metFORMIN (GLUCOPHAGE) 1000 MG tablet Take 1 tablet (1,000  mg total) by mouth 2 (two) times daily with a meal. 60 tablet 3  . nitroGLYCERIN (NITROSTAT) 0.4 MG SL tablet PLACE 1 TABLET UNDER TONGUE EVERY 5 MINS, UP TO 3 DOSES AS NEEDED FOR CHEST PAIN (Patient taking differently: Place 0.4 mg under the tongue every 5 (five) minutes as needed for chest pain.) 25 tablet 2  . potassium chloride SA (KLOR-CON) 20 MEQ tablet Take 2 tablets (40 mEq total) by mouth daily. Please make yearly appt with Dr. Johnsie Cancel for January 2022 for future refills. 1st attempt 180 tablet 0  . sacubitril-valsartan (ENTRESTO) 97-103 MG Take 1 tablet by mouth 2 (two) times daily. 60 tablet 11  . spironolactone (ALDACTONE) 25 MG tablet Take 1 tablet (25 mg total) by mouth daily. 90 tablet 3  . triamcinolone cream (KENALOG) 0.1 % Apply 1 application topically 2 (two) times daily. (Patient taking differently: Apply 1 application topically daily as needed (rash).) 30 g 0   No current facility-administered medications for this encounter.   BP (!) 160/90   Pulse 88   Wt 119.9 kg (264 lb 6.4 oz)   SpO2 99%   BMI 41.41 kg/m  General: NAD Neck: No JVD, no thyromegaly or thyroid nodule.  Lungs: Clear to auscultation bilaterally with normal respiratory effort. CV: Nondisplaced PMI.  Heart regular S1/S2, no S3/S4, no murmur.  No peripheral edema.  No carotid bruit.  Normal pedal pulses.  Abdomen: Soft, nontender, no hepatosplenomegaly, no distention.  Skin: Intact without lesions or rashes.  Neurologic: Alert and oriented x 3.  Psych: Normal affect. Extremities: No  clubbing or cyanosis.  HEENT: Normal.   Assessment/Plan: 1. CAD: RCA known to be chronically occluded. He has had multiple PCIs to LAD. Repeat LHC in 10/21 done for recurrent angina demonstrated occluded RCA (known from past) and occluded pLAD with no collaterals. His LCx was patent. I do not think we have a revascularization option, multiple overlapping LAD stents, now occluded and there are no good surgical targets. No recent chest pain.   - Continue ASA 81  - Continue Atorvastatin 80  2. Chronic systolic CHF: Ischemic cardiomyopathy. Echo in 10/21 with EF 20-25%, severe LV dilation, mildly decreased RV systolic function. RHC in 10/21 demonstrated elevated PCWP, mild pulmonary venous hypertension and low cardiac output with CI 1.4. Patient was transiently on milrinone but weaned off. CPX in 10/21 showed peak VO2 16, VE/VCO2 slope 37, RER 1.13 => moderate HF limitation.  RHC and CPX do not match.  Symptomatically, he seems more like the CPX.  He describes NYHA class II symptoms currently.   Today, he is not volume overloaded.  Despite low CO on RHC, he is not markedly symptomatic.  - Continue Lasix 40 mg daily, BMET today.   - Continue Entresto 97/103 bid.  - I will add back Coreg 3.125 mg bid.  - Continue spironolactone 25 daily  - Continue digoxin 0.125 daily, check level.   - Start Farxiga 10 mg daily.    - He has had LVAD workup.  He had low CO on RHC though symptomatically does not seem that severe, CPX matches symptoms more with moderate HF limitation.  Needs better blood glucose control prior to considering LVAD or transplant.  With symptomatic improvement recently and with cessation of smoking, he may end up being a transplant candidate.   He will need to lose weight and get glucose under good control. If he remains stable off cigarettes for 6 months, will send for transplant evaluation.  If he  worsens in the interim, will need LVAD.  - He is not a CRT candidate. We recommended Lifevest  but he refused.  He does, however, have appt set up with Dr. Caryl Comes for ICD discussion.  3. Type 2 diabetes: New diagnosis in 7/21 with hgbA1c 11.3. He is on metformin and Lantus.  Needs better control of HgbA1c to get LVAD or transplant.  - Needs endocrinology appointment, have referred.  - Continue Farxiga 10 mg daily.  4. OSA: Has sleep study coming up.   5. Smoking:  He recently quit.  PFTs with minimal obstructive disease.  - Continue Wellbutrin 6. HLD: Goal LDL < 70. He is on atorvastatin 80 and has started Repatha.   Close followup, will see 6 wks with me and in 2 wks with HF pharmacist (?start Bidil).   Loralie Champagne 08/19/2020

## 2020-08-24 NOTE — Progress Notes (Signed)
PCP: Janeece Agee, NP Cardiology: Dr. Eden Emms HF Cardiology: Dr. Shirlee Latch  HPI:  45 y.o. with history of CAD s/p multiple MIs and PCIs, ischemic cardiomyopathy, HTN, active smoking, and diabetes.  Patient has had multiple coronary interventions, first in 2010.  His RCA is now chronically occluded, and he has had many stents in the LAD.  Echo in 1/18 showed EF 40%.  EF was down to 20-25% in 12/19.  Echo in 7/21 was reported as EF 25-30%, moderate LV enlargement, normal RV.  Of note, patient seen in urgent care in 7/21 and noted to have blood glucose > 400.  HgbA1c was 11.3%. He was started on metformin at urgent care with new diagnosis of diabetes.     Given more frequent chest pain and dyspnea with moderate exertion, he was taken for LHC/RHC in 10/21.  This showed low output HF with CI 1.48.  His LAD was totally occluded (long segment) with no collaterals, the RCA was totally occluded with R-R collaterals, and the LCx was patent.  He was admitted and started on milrinone, diuresed.  He was weaned off milrinone.  Lifevest was recommended but he refused.  He was seen by cardiac surgery, not a candidate for CABG.  We began LVAD evaluation (active smoker, not transplant candidate).  Echo in 10/21 showed EF 20-25% with severe LV dilation.  CPX (10/21) showed peak VO2 16, VE/VCO2 slope 37, RER 1.13 => moderate HF limitation.   Patient recently returned to HF Clinic for followup of CHF and CAD.  He was doing better overall.  No orthopnea/PND.  Weight was down 4 lbs.  No dyspnea walking on flat ground, dyspnea only with heavy exertion.  No chest pain.  Had sleep study and appt with Dr Graciela Husbands for ICD scheduled. He was not smoking.    Today he returns to HF clinic for pharmacist medication titration. At last visit with MD, carvedilol 3.125 mg BID and Farxiga 10 mg daily were initiated. Saw Dr. Graciela Husbands 08/27/20 to discuss ICD placement. Patient indicated he would think further on placement and call Dr. Odessa Fleming office  back in 1-2 weeks with decision. Patient tearful today in clinic. He lost 4 people he cared about over the last week and notes this has been very hard for him. He did note that he was recently saved and feels like he has a strong foundation in his [employer]. Does note occasional dizziness when he bends down, which is not currently bothersome. No CP or palpitations. Weight has been stable in clinic, but he noted he felt like his weight was increasing at home. Believes the weight gain to be related to caloric intake rather than fluid. He takes furosemide 40 mg daily and has not needed any extra. No LEE, PND or orthopnea. Taking all medications as prescribed and tolerating all medications.    HF Medications: Carvedilol 3.125 mg BID Entresto 97/103 mg BID Spironolactone 25 mg daily Farxiga 10 mg daily Digoxin 0.125 mg daily Furosemide 40 mg daily Potassium chloride 40 mEq daily   Has the patient been experiencing any side effects to the medications prescribed?  no  Does the patient have any problems obtaining medications due to transportation or finances?   No [facility] Medicare/Medicaid  Understanding of regimen: fair Understanding of indications: good Potential of compliance: excellent Patient understands to avoid NSAIDs. Patient understands to avoid decongestants.    Pertinent Lab Values (08/18/20): Marland Kitchen Serum creatinine 1.16, BUN 11, Potassium 4.1, Sodium 141  Vital Signs: . Weight: 264.6  lbs (last clinic weight: 264.4 lbs) . Blood pressure: 148/90  . Heart rate: 68   Assessment/Plan: 1. CAD: RCA known to be chronically occluded. He has had multiple PCIs to LAD.Repeat LHC in 10/21 done for recurrent angina demonstrated occluded RCA (known from past) and occluded pLAD with no collaterals. His LCx was patent.No revascularization option, multiple overlapping LAD stents, now occluded and there are no good surgical targets. No recent chest pain.   - Continue ASA 81  - Continue  Atorvastatin 80 mg daily and Repatha 2.Chronic systolic CHF: Ischemic cardiomyopathy. Echo in 10/21 with EF 20-25%, severe LV dilation, mildly decreased RV systolic function. RHC in 10/21 demonstrated elevated PCWP, mild pulmonary venous hypertension and low cardiac output with CI 1.4. Patient was transiently on milrinone but weaned off. CPX in 10/21 showed peak VO2 16, VE/VCO2 slope 37, RER 1.13 => moderate HF limitation.  RHC and CPX do not match.  - He describes NYHA class II symptoms currently.   Today, he is not volume overloaded.  Despite low CO on RHC, he is not markedly symptomatic.  - Continue furosemide 40 mg daily - Continue carvedilol 3.125 mg BID - Continue Entresto 97/103 mg BID.  -Continue spironolactone 25 daily  - Continue Farxiga 10 mg daily.  - Start Bidil 1/2 tablet TID - Continue digoxin 0.125 mg daily  - He has had LVAD workup.  He had low CO on RHC though symptomatically does not seem that severe, CPX matches symptoms more with moderate HF limitation.  Needs better blood glucose control prior to considering LVAD or transplant.  With symptomatic improvement recently and with cessation of smoking, he may end up being a transplant candidate.   He will need to lose weight and get glucose under good control. If he remains stable off cigarettes for 6 months, will send for transplant evaluation.  If he worsens in the interim, will need LVAD.  - He is not a CRT candidate.We recommended Lifevest but he refused.  Currently discussing ICD implant with Dr. Graciela Husbands 3. Type 2 diabetes: New diagnosis in 7/21 with hgbA1c 11.3. He is on metformin and Lantus.  Needs better control of HgbA1c to get LVAD or transplant.  -Needs endocrinology appointment, has been referred.  - Continue Farxiga 10 mg daily.  4. OSA: Has sleep study coming up.  5. Smoking:  He recently quit.  PFTs with minimal obstructive disease.  - ContinueWellbutrin 6. HLD: Goal LDL < 70. He is on atorvastatin 80 and has  started Repatha.    Karle Plumber, PharmD, BCPS, BCCP, CPP Heart Failure Clinic Pharmacist [phone]

## 2020-08-27 ENCOUNTER — Other Ambulatory Visit: Payer: Self-pay

## 2020-08-27 ENCOUNTER — Telehealth (INDEPENDENT_AMBULATORY_CARE_PROVIDER_SITE_OTHER): Payer: Medicare Other | Admitting: Internal Medicine

## 2020-08-27 VITALS — Ht 67.0 in | Wt 252.0 lb

## 2020-08-27 DIAGNOSIS — I5022 Chronic systolic (congestive) heart failure: Secondary | ICD-10-CM

## 2020-08-27 DIAGNOSIS — I255 Ischemic cardiomyopathy: Secondary | ICD-10-CM

## 2020-08-27 NOTE — Progress Notes (Signed)
Electrophysiology TeleHealth Note   Due to national recommendations of social distancing due to COVID 19, an audio/video telehealth visit is felt to be most appropriate for this patient at this time.  See MyChart message from today for the patient's consent to telehealth for [facility]   Date:  08/27/2020   ID:  Peter Russo, DOB 26-Nov-1975, MRN [id number]  Location: patient's home  Provider location: [address], [place]  Evaluation Performed: Follow-up visit  PCP:  Maximiano Coss, NP  Cardiologist:   [facility] Electrophysiologist:  SK   Chief Complaint:   CD   History of Present Illness:    Peter Russo is a 45 y.o. male who presents via audio/video conferencing for a telehealth visit today.  Since last being seen in our clinic for consideration of an ICD  the patient reports doing relatively well symptomatically   He remains ambivalent regarding an ICD and has questions regarding transvenous from subQ  No chest pain, no edema no palps or presyncope [place] DOE   History of premature coronary artery disease with LAD stenting 2010, RCA stenting 2012 with issues of noncompliance related at least in part to affordability of medications.  DATE TEST EF   2/17 Echo 35-40%   3/18 LHC*  LADp T-PCI; CXp-10%; RCAp-T (CTO)  12/19 Echo 20.25%   2/21 cMRI 26% Anteroapical fullthickness LGE Inferior HK  10/21 Echo  20-25%    Date Cr K LDL Hgb  2/21 1.16 3.6  74 13.8            The patient denies symptoms of fevers, chills, cough, or new SOB worrisome for COVID 19.    Past Medical History:  Diagnosis Date  . Anxiety   . CAD S/P percutaneous coronary angioplasty    a. s/p BMS-mLAD 2010. b. DES to RCA 2012. c. 04/2015: DES to LCx and PCI to LAD c/b dissection with subsequent overlapping DES to mLAD - r/i for NSTEMI afterwards; d. 08/2015 Ant STEMI in setting of noncompliance->123mAD ISR (3.0x16 Synergy DES); e. 03/2016 PCI of apical LAD;  f. 03/2016 Relook Cath: patent LAD stents, RCA 100p CTO-->Med Rx; g. 10/2016 Ant STEMI: PTCA 100p LAD.  .Marland KitchenChronic combined systolic and diastolic CHF (congestive heart failure) ([facility]    a. 03/2016 Echo: EF 30-35%, Gr2 DD;  b. 08/2016 Echo: EF 40%.  . Depression   . Essential hypertension   . GAD (generalized anxiety disorder)   . GERD (gastroesophageal reflux disease)   . Hypercholesteremia   . Ischemic cardiomyopathy    a. prior EF 25-35 percent at cath, 35-40 percent by echo; b. 03/2016 Echo: EF 30-35%, diff HK, Gr2 DD; c. 08/2016 Echo: EF 40%, base/mid inferior/inferowseptal AK, mildly dil LA.  . Morbid obesity ([facility]   . Pre-diabetes   . SVT (supraventricular tachycardia) (HCC) APRROX 10 YRS AGO   PRESUMED AVNRT, ECHO 12/07 WITH EF 65%, MILD LAE  . Tobacco abuse    a. 20 + pack years, quit 10/2016.    Past Surgical History:  Procedure Laterality Date  . CARDIAC CATHETERIZATION N/A 04/23/2015   Procedure: Left Heart Cath and Coronary Angiography;  Surgeon: PJosue Hector MD;  [facility];  Service: Cardiovascular;  Laterality: N/A;  . CARDIAC CATHETERIZATION N/A 04/23/2015   Procedure: Coronary Stent Intervention;  Surgeon: MSherren Mocha MD;  [facility];  Service: Cardiovascular;  Laterality: N/A;  . CARDIAC CATHETERIZATION N/A 09/14/2015   Procedure: Left Heart Cath  and Coronary Angiography;  Surgeon: Belva Crome, MD;  LAD 100%, CFX 10% ISR, RCA 100% (chronic), EF 25-35%  . CARDIAC CATHETERIZATION N/A 09/14/2015   Procedure: Coronary Stent Intervention;  Surgeon: Belva Crome, MD;  Location: [place] [facility];  Service: Cardiovascular;  Laterality: N/A; 3.0 x 16 mm Synergy DES LAD  . CARDIAC CATHETERIZATION  03/16/2016  . CARDIAC CATHETERIZATION N/A 03/16/2016   Procedure: Left Heart Cath and Coronary Angiography;  Surgeon: Belva Crome, MD;  Location: [place] [facility];  Service: Cardiovascular;  Laterality: N/A;  . CARDIAC CATHETERIZATION N/A 03/16/2016    Procedure: Coronary Balloon Angioplasty;  Surgeon: Belva Crome, MD;  Location: [place] [facility];  Service: Cardiovascular;  Laterality: N/A;  . CARDIAC CATHETERIZATION N/A 03/25/2016   Procedure: Left Heart Cath and Coronary Angiography;  Surgeon: Peter M Martinique, MD;  Location: [place] [facility];  Service: Cardiovascular;  Laterality: N/A;  . CORONARY ANGIOPLASTY WITH STENT PLACEMENT  2010; 2013  . CORONARY BALLOON ANGIOPLASTY N/A 10/19/2016   Procedure: Coronary Balloon Angioplasty;  Surgeon: Jettie Booze, MD;  Location: [place] [facility];  Service: Cardiovascular;  Laterality: N/A;  . INTRAVASCULAR ULTRASOUND/IVUS N/A 10/19/2016   Procedure: Intravascular Ultrasound/IVUS;  Surgeon: Jettie Booze, MD;  Location: [place] [facility];  Service: Cardiovascular;  Laterality: N/A;  . LEFT HEART CATH AND CORONARY ANGIOGRAPHY N/A 10/19/2016   Procedure: Left Heart Cath and Coronary Angiography;  Surgeon: Jettie Booze, MD;  Location: [place] [facility];  Service: Cardiovascular;  Laterality: N/A;  . RIGHT/LEFT HEART CATH AND CORONARY ANGIOGRAPHY N/A 05/21/2020   Procedure: RIGHT/LEFT HEART CATH AND CORONARY ANGIOGRAPHY;  Surgeon: Larey Dresser, MD;  Location: [place] [facility];  Service: Cardiovascular;  Laterality: N/A;    Current Outpatient Medications  Medication Sig Dispense Refill  . acetaminophen (TYLENOL) 325 MG tablet Take 2 tablets (650 mg total) by mouth every 4 (four) hours as needed for headache or mild pain.    Marland Kitchen aspirin 81 MG EC tablet Take 1 tablet (81 mg total) by mouth daily. 90 tablet 3  . atorvastatin (LIPITOR) 80 MG tablet TAKE 1 TABLET BY MOUTH EVERY DAY AT 6 PM 90 tablet 1  . blood glucose meter kit and supplies Dispense based on patient and insurance preference. Use up to four times daily as directed. (FOR ICD-10 E10.9, E11.9). 1 each 0  . buPROPion (WELLBUTRIN XL) 150 MG 24 hr tablet Take 1 tablet (150 mg total) by mouth daily. 90 tablet 3  . carvedilol  (COREG) 3.125 MG tablet Take 1 tablet (3.125 mg total) by mouth 2 (two) times daily. 180 tablet 3  . clotrimazole-betamethasone (LOTRISONE) cream Apply 1 application topically 2 (two) times daily. (Patient taking differently: Apply 1 application topically daily as needed (Rash).) 30 g 0  . dapagliflozin propanediol (FARXIGA) 10 MG TABS tablet Take 1 tablet (10 mg total) by mouth daily before breakfast. 30 tablet 11  . digoxin (LANOXIN) 0.125 MG tablet Take 1 tablet (125 mcg total) by mouth daily. Please make yearly appt with Dr. Johnsie Cancel for January 2022 for future refills. Thank you 1st attempt 90 tablet 0  . Evolocumab (REPATHA SURECLICK) 160 MG/ML SOAJ Inject 1 pen into the skin every 14 (fourteen) days. 2 mL 11  . furosemide (LASIX) 40 MG tablet Take 1 tablet (40 mg total) by mouth daily. 90 tablet 3  . insulin glargine (LANTUS SOLOSTAR) 100 UNIT/ML Solostar Pen Inject 23 Units into the skin daily.  15 mL 11  . Insulin Pen Needle (PEN NEEDLES 3/16") 31G X 5 MM MISC Lantus 23 100 each 1  . metFORMIN (GLUCOPHAGE) 1000 MG tablet Take 1 tablet (1,000 mg total) by mouth 2 (two) times daily with a meal. 60 tablet 3  . nitroGLYCERIN (NITROSTAT) 0.4 MG SL tablet PLACE 1 TABLET UNDER TONGUE EVERY 5 MINS, UP TO 3 DOSES AS NEEDED FOR CHEST PAIN (Patient taking differently: Place 0.4 mg under the tongue every 5 (five) minutes as needed for chest pain.) 25 tablet 2  . potassium chloride SA (KLOR-CON) 20 MEQ tablet Take 2 tablets (40 mEq total) by mouth daily. Please make yearly appt with Dr. Johnsie Cancel for January 2022 for future refills. 1st attempt 180 tablet 0  . sacubitril-valsartan (ENTRESTO) 97-103 MG Take 1 tablet by mouth 2 (two) times daily. 60 tablet 11  . spironolactone (ALDACTONE) 25 MG tablet Take 1 tablet (25 mg total) by mouth daily. 90 tablet 3  . triamcinolone cream (KENALOG) 0.1 % Apply 1 application topically 2 (two) times daily. (Patient taking differently: Apply 1 application topically daily as  needed (rash).) 30 g 0   No current facility-administered medications for this visit.    Allergies:   Patient has no known allergies.   Social History:  The patient  reports that he quit smoking about 3 years ago. He has a 24.00 pack-year smoking history. He has never used smokeless tobacco. He reports current drug use. Drug: Marijuana. He reports that he does not drink alcohol.   Family History:  The patient's   family history includes Arrhythmia in his mother; Coronary artery disease in his mother; Coronary artery disease (age of onset: 71) in his father; Heart attack in his brother, father, and mother; Heart disease in his father; Hypertension in his mother.   ROS:  Please see the history of present illness.   All other systems are personally reviewed and negative.    Exam:    Vital Signs:  Ht 5' 7"  (1.702 m)   Wt 252 lb (114.3 kg)   BMI 39.47 kg/m       Labs/Other Tests and Data Reviewed:    Recent Labs: 05/21/2020: ALT 26; B Natriuretic Peptide 161.1; TSH 1.307 05/22/2020: Magnesium 1.8 07/03/2020: Hemoglobin 14.8; Platelets 222 08/18/2020: BUN 11; Creatinine, Ser 1.16; Potassium 4.1; Sodium 141   Wt Readings from Last 3 Encounters:  08/27/20 252 lb (114.3 kg)  08/18/20 264 lb 6.4 oz (119.9 kg)  07/15/20 268 lb (121.6 kg)     Other studies personally reviewed: Additional studies/ records that were reviewed today include: (As above)    Review of the abov     ASSESSMENT & PLAN:    Ischemic cardiomyopathy  Congestive heart failure chronic systolic classIIb-IIIa  Obesity  Sleep disordered breathing  Abnormal ECG  Anxiety  Lengthy discussion re ICD and transvenous and subcutaneous--he prefers the former, given longer battery and lower likelihood of inappropriate shocks  We also discussed the changing fulcrum of equipoise regarding the role of primary prevention ICD, such to the point that the [facility] group is repeating a primary prevention randomised trial  for those on quadruple therapy     He told me about the chimeric transplant this week   Also he has gone to church and given his life to God   He is less scared        COVID 19 screen The patient denies symptoms of COVID 19 at this time.  The importance of social distancing  was discussed today.  Follow-up:  Call next week to see about final decision to get ICD implant    Current medicines are reviewed at length with the patient today.   The patient does not have concerns regarding his medicines.  The following changes were made today:  none  Labs/ tests ordered today include:   No orders of the defined types were placed in this encounter.   Future tests ( post COVID )     Patient Risk:  after full review of this patients clinical status, I feel that they are at moderate  risk at this time.  Today, I have spent22 minutes with the patient with telehealth technology discussing the above.  Signed, Virl Axe, MD  08/27/2020 5:02 PM     [facility] [address] [place] [address] [phone] (office) [phone] (fax)

## 2020-08-30 ENCOUNTER — Other Ambulatory Visit (HOSPITAL_COMMUNITY): Payer: Self-pay | Admitting: Cardiology

## 2020-09-08 ENCOUNTER — Ambulatory Visit (HOSPITAL_COMMUNITY)
Admission: RE | Admit: 2020-09-08 | Discharge: 2020-09-08 | Disposition: A | Discharge status: Home / Self Care | Payer: Medicare Other | Source: Ambulatory Visit | Attending: Registered Nurse | Admitting: Registered Nurse

## 2020-09-08 ENCOUNTER — Other Ambulatory Visit: Payer: Self-pay

## 2020-09-08 VITALS — BP 148/90 | HR 68 | Wt 264.6 lb

## 2020-09-08 DIAGNOSIS — I5042 Chronic combined systolic (congestive) and diastolic (congestive) heart failure: Secondary | ICD-10-CM | POA: Insufficient documentation

## 2020-09-08 DIAGNOSIS — I5022 Chronic systolic (congestive) heart failure: Secondary | ICD-10-CM

## 2020-09-08 MED ORDER — BIDIL 20-37.5 MG PO TABS: 0.5000 | ORAL_TABLET | Freq: Three times a day (TID) | ORAL | 11 refills | Status: DC

## 2020-09-08 NOTE — Patient Instructions (Signed)
It was a pleasure seeing you today!  MEDICATIONS: -We are changing your medications today -Start Bidil 1/2 tablet three times daily -Call if you have questions about your medications.   NEXT APPOINTMENT: Return to clinic in 1 month with Dr. Shirlee Latch.  In general, to take care of your heart failure: -Limit your fluid intake to 2 Liters (half-gallon) per day.   -Limit your salt intake to ideally 2-3 grams (2000-3000 mg) per day. -Weigh yourself daily and record, and bring that "weight diary" to your next appointment.  (Weight gain of 2-3 pounds in 1 day typically means fluid weight.) -The medications for your heart are to help your heart and help you live longer.   -Please contact us before stopping any of your heart medications.  Call the clinic at [phone] with questions or to reschedule future appointments.

## 2020-09-11 ENCOUNTER — Ambulatory Visit (HOSPITAL_BASED_OUTPATIENT_CLINIC_OR_DEPARTMENT_OTHER): Payer: Medicare Other | Admitting: Cardiology

## 2020-09-22 ENCOUNTER — Other Ambulatory Visit: Payer: Self-pay

## 2020-09-23 ENCOUNTER — Ambulatory Visit (INDEPENDENT_AMBULATORY_CARE_PROVIDER_SITE_OTHER): Payer: Medicare Other | Admitting: Internal Medicine

## 2020-09-23 ENCOUNTER — Encounter: Payer: Self-pay | Admitting: Internal Medicine

## 2020-09-23 VITALS — BP 120/72 | HR 88 | Ht 67.0 in | Wt 264.0 lb

## 2020-09-23 DIAGNOSIS — E1165 Type 2 diabetes mellitus with hyperglycemia: Secondary | ICD-10-CM | POA: Diagnosis not present

## 2020-09-23 DIAGNOSIS — E1159 Type 2 diabetes mellitus with other circulatory complications: Secondary | ICD-10-CM

## 2020-09-23 LAB — POCT GLYCOSYLATED HEMOGLOBIN (HGB A1C): Hemoglobin A1C: 6.8 % — AB (ref 4.0–5.6)

## 2020-09-23 LAB — POCT GLUCOSE (DEVICE FOR HOME USE): POC Glucose: 203 mg/dl — AB (ref 70–99)

## 2020-09-23 MED ORDER — LANTUS SOLOSTAR 100 UNIT/ML ~~LOC~~ SOPN: 22.0000 [IU] | PEN_INJECTOR | Freq: Every day | SUBCUTANEOUS | 11 refills | Status: DC

## 2020-09-23 MED ORDER — METFORMIN HCL ER 500 MG PO TB24: 500.0000 mg | ORAL_TABLET | Freq: Two times a day (BID) | ORAL | 3 refills | Status: DC

## 2020-09-23 NOTE — Patient Instructions (Signed)
-   Keep up the Good Work ! - Take Lantus 22 units daily  - STOP Metformin 1000 mg  - Start Metformin 500 mg XR , 1 tablet twice daily  - Continue Farxiga 10 mg, 1 tablet with Breakfast       HOW TO TREAT LOW BLOOD SUGARS (Blood sugar LESS THAN 70 MG/DL)  Please follow the RULE OF 15 for the treatment of hypoglycemia treatment (when your (blood sugars are less than 70 mg/dL)    STEP 1: Take 15 grams of carbohydrates when your blood sugar is low, which includes:   3-4 GLUCOSE TABS  OR  3-4 OZ OF JUICE OR REGULAR SODA OR  ONE TUBE OF GLUCOSE GEL     STEP 2: RECHECK blood sugar in 15 MINUTES STEP 3: If your blood sugar is still low at the 15 minute recheck --> then, go back to STEP 1 and treat AGAIN with another 15 grams of carbohydrates.

## 2020-09-23 NOTE — Progress Notes (Signed)
Name: Peter Russo  MRN/ DOB: [id number], 06-09-1976   Age/ Sex: 45 y.o., male    PCP: Maximiano Coss, NP   Reason for Endocrinology Evaluation: Type 2 Diabetes Mellitus     Date of Initial Endocrinology Visit: 09/23/2020     PATIENT IDENTIFIER: Peter Russo is a 45 y.o. male with a past medical history of T2Dm, CAD ( S/P PCI), CHF, and dyslipidemia. The patient presented for initial endocrinology clinic visit on 09/23/2020 for consultative assistance with his diabetes management.    HPI: Peter Russo was    Diagnosed with DM 02/2020 Prior Medications tried/Intolerance: as listed  Currently checking blood sugars every other day  Hypoglycemia episodes : no            Hemoglobin A1c has ranged from 11.3% in7/2021, peaking at 12.2% in 2021. Patient required assistance for hypoglycemia:  Patient has required hospitalization within the last 1 year from hyper or hypoglycemia: no   In terms of diet, the patient eats 2-3 meals a day, snacks occasionally. Drinks sugar-sweetened beverages .    HOME DIABETES REGIMEN: Lantus 23 units daily - has been taking it twice a week  Metformin 1000 mg BID  Farxiga 10 mg daily        Statin: yes ACE-I/ARB: yes Prior Diabetic Education: no   METER DOWNLOAD SUMMARY: Did not bring    DIABETIC COMPLICATIONS: Microvascular complications:    Denies: CKD, retinopathy, neuropathy   Last eye exam: Completed 09/2020  Macrovascular complications:   CAD ( S/P PCI) , CHF   Denies: PVD, CVA   PAST HISTORY: Past Medical History:  Past Medical History:  Diagnosis Date   Anxiety    CAD S/P percutaneous coronary angioplasty    a. s/p BMS-mLAD 2010. b. DES to RCA 2012. c. 04/2015: DES to LCx and PCI to LAD c/b dissection with subsequent overlapping DES to mLAD - r/i for NSTEMI afterwards; d. 08/2015 Ant STEMI in setting of noncompliance->125mAD ISR (3.0x16 Synergy DES); e. 03/2016 PCI of apical LAD; f. 03/2016 Relook Cath: patent  LAD stents, RCA 100p CTO-->Med Rx; g. 10/2016 Ant STEMI: PTCA 100p LAD.   Chronic combined systolic and diastolic CHF (congestive heart failure) ([place]    a. 03/2016 Echo: EF 30-35%, Gr2 DD;  b. 08/2016 Echo: EF 40%.   Depression    Essential hypertension    GAD (generalized anxiety disorder)    GERD (gastroesophageal reflux disease)    Hypercholesteremia    Ischemic cardiomyopathy    a. prior EF 25-35 percent at cath, 35-40 percent by echo; b. 03/2016 Echo: EF 30-35%, diff HK, Gr2 DD; c. 08/2016 Echo: EF 40%, base/mid inferior/inferowseptal AK, mildly dil LA.   Morbid [place] ([place]    Pre-diabetes    SVT [place]) [place] [facility] [place] Y[place]   PRESUMED AVNRT, ECHO 12/07 WITH EF 65%, MILD LAE   Tobacco abuse    a. 20 + pack years, quit 10/2016.   Past Surgical History:  Past Surgical History:  Procedure Laterality Date   CARDIAC CATHETERIZATION N/A 04/23/2015   Procedure: Left Heart Cath and Coronary Angiography;  Surgeon: PJosue Hector MD;  [facility];  Service: Cardiovascular;  Laterality: N/A;   CARDIAC CATHETERIZATION N/A 04/23/2015   Procedure: Coronary Stent Intervention;  Surgeon: MSherren Mocha MD;  [facility];  Service: Cardiovascular;  Laterality: N/A;   CARDIAC CATHETERIZATION N/A 09/14/2015   Procedure: Left Heart Cath and Coronary Angiography;  Surgeon: HLynnell Dike  Tamala Julian, MD;  LAD 100%, CFX 10% ISR, RCA 100% (chronic), EF 25-35%   CARDIAC CATHETERIZATION N/A 09/14/2015   Procedure: Coronary Stent Intervention;  Surgeon: Belva Crome, MD;  Location: [place] [facility];  Service: Cardiovascular;  Laterality: N/A; 3.0 x 16 mm Synergy DES LAD   CARDIAC CATHETERIZATION  03/16/2016   CARDIAC CATHETERIZATION N/A 03/16/2016   Procedure: Left Heart Cath and Coronary Angiography;  Surgeon: Belva Crome, MD;  Location: [place] [facility];  Service: Cardiovascular;  Laterality: N/A;   CARDIAC CATHETERIZATION N/A 03/16/2016    Procedure: Coronary Balloon Angioplasty;  Surgeon: Belva Crome, MD;  Location: [place] [facility];  Service: Cardiovascular;  Laterality: N/A;   CARDIAC CATHETERIZATION N/A 03/25/2016   Procedure: Left Heart Cath and Coronary Angiography;  Surgeon: Peter M Martinique, MD;  Location: [place] [facility];  Service: Cardiovascular;  Laterality: N/A;   CORONARY ANGIOPLASTY WITH STENT PLACEMENT  2010; 2013   CORONARY BALLOON ANGIOPLASTY N/A 10/19/2016   Procedure: Coronary Balloon Angioplasty;  Surgeon: Jettie Booze, MD;  Location: [place] [facility];  Service: Cardiovascular;  Laterality: N/A;   INTRAVASCULAR ULTRASOUND/IVUS N/A 10/19/2016   Procedure: Intravascular Ultrasound/IVUS;  Surgeon: Jettie Booze, MD;  Location: [place] [facility];  Service: Cardiovascular;  Laterality: N/A;   LEFT HEART CATH AND CORONARY ANGIOGRAPHY N/A 10/19/2016   Procedure: Left Heart Cath and Coronary Angiography;  Surgeon: Jettie Booze, MD;  Location: [place] [facility];  Service: Cardiovascular;  Laterality: N/A;   RIGHT/LEFT HEART CATH AND CORONARY ANGIOGRAPHY N/A 05/21/2020   Procedure: RIGHT/LEFT HEART CATH AND CORONARY ANGIOGRAPHY;  Surgeon: Larey Dresser, MD;  Location: [place] [facility];  Service: Cardiovascular;  Laterality: N/A;      Social History:  reports that he quit smoking about 3 years ago. He has a 24.00 pack-year smoking history. He has never used smokeless tobacco. He reports current drug use. Drug: Marijuana. He reports that he does not drink alcohol. Family History:  Family History  Problem Relation Age of Onset   Arrhythmia Mother        MOTHER LIVED TO BE 37   Coronary artery disease Mother    Heart attack Mother    Hypertension Mother    Coronary artery disease Father 65   Heart disease Father    Heart attack Father    Heart attack Brother    Stroke Neg Hx      HOME MEDICATIONS: Allergies as of 09/23/2020   No Known Allergies     Medication List        Accurate as of September 23, 2020  2:28 PM. If you have any questions, ask your nurse or doctor.        acetaminophen 325 MG tablet Commonly known as: TYLENOL Take 2 tablets (650 mg total) by mouth every 4 (four) hours as needed for headache or mild pain.   aspirin 81 MG EC tablet Take 1 tablet (81 mg total) by mouth daily.   atorvastatin 80 MG tablet Commonly known as: LIPITOR TAKE 1 TABLET BY MOUTH EVERY DAY AT 6 PM   BiDil 20-37.5 MG tablet Generic drug: isosorbide-hydrALAZINE Take 0.5 tablets by mouth 3 (three) times daily.   blood glucose meter kit and supplies Dispense based on patient and insurance preference. Use up to four times daily as directed. (FOR ICD-10 E10.9, E11.9).   buPROPion 150 MG 24 hr tablet Commonly known as: WELLBUTRIN XL Take 1 tablet (150 mg total) by  mouth daily.   carvedilol 3.125 MG tablet Commonly known as: COREG Take 1 tablet (3.125 mg total) by mouth 2 (two) times daily.   clotrimazole-betamethasone cream Commonly known as: Lotrisone Apply 1 application topically 2 (two) times daily. What changed:   when to take this  reasons to take this   dapagliflozin propanediol 10 MG Tabs tablet Commonly known as: Farxiga Take 1 tablet (10 mg total) by mouth daily before breakfast.   digoxin 0.125 MG tablet Commonly known as: LANOXIN Take 1 tablet (125 mcg total) by mouth daily. Please make yearly appt with Dr. Johnsie Cancel for January 2022 for future refills. Thank you 1st attempt   Entresto 97-103 MG Generic drug: sacubitril-valsartan Take 1 tablet by mouth 2 (two) times daily.   furosemide 40 MG tablet Commonly known as: LASIX Take 1 tablet (40 mg total) by mouth daily.   Lantus SoloStar 100 UNIT/ML Solostar Pen Generic drug: insulin glargine Inject 23 Units into the skin daily.   metFORMIN 1000 MG tablet Commonly known as: GLUCOPHAGE TAKE 1 TABLET (1,000 MG TOTAL) BY MOUTH 2 (TWO) TIMES DAILY WITH A MEAL.   nitroGLYCERIN 0.4 MG SL  tablet Commonly known as: NITROSTAT PLACE 1 TABLET UNDER TONGUE EVERY 5 MINS, UP TO 3 DOSES AS NEEDED FOR CHEST PAIN What changed: See the new instructions.   Pen Needles 3/16" 31G X 5 MM Misc Lantus 23   potassium chloride SA 20 MEQ tablet Commonly known as: KLOR-CON Take 2 tablets (40 mEq total) by mouth daily. Please make yearly appt with Dr. Johnsie Cancel for January 2022 for future refills. 1st attempt   Repatha SureClick 154 MG/ML Soaj Generic drug: Evolocumab Inject 1 pen into the skin every 14 (fourteen) days.   spironolactone 25 MG tablet Commonly known as: ALDACTONE Take 1 tablet (25 mg total) by mouth daily.   triamcinolone 0.1 % Commonly known as: KENALOG Apply 1 application topically 2 (two) times daily. What changed:   when to take this  reasons to take this        ALLERGIES: No Known Allergies   REVIEW OF SYSTEMS: A comprehensive ROS was conducted with the patient and is negative except as per HPI and below:  Review of Systems  Gastrointestinal: Positive for diarrhea. Negative for nausea.  Genitourinary: Positive for frequency.  Endo/Heme/Allergies: Positive for polydipsia.      OBJECTIVE:   VITAL SIGNS: BP 120/72    Pulse 88    Ht 5' 7" (1.702 m)    Wt 264 lb (119.7 kg)    SpO2 98%    BMI 41.35 kg/m    PHYSICAL EXAM:  General: Pt appears well and is in NAD  Neck: General: Supple without adenopathy or carotid bruits. Thyroid: Thyroid size normal.  No goiter or nodules appreciated. No thyroid bruit.  Lungs: Clear with good BS bilat with no rales, rhonchi, or wheezes  Heart: RRR with normal S1 and S2 and no gallops; no murmurs; no rub  Abdomen: Normoactive bowel sounds, soft, nontender, without masses or organomegaly palpable  Extremities:  Lower extremities - No pretibial edema. No lesions.  Skin: Normal texture and temperature to palpation. No rash noted. No Acanthosis nigricans/skin tags. No lipohypertrophy.  Neuro: MS is good with appropriate  affect, pt is alert and Ox3    DATA REVIEWED:  Lab Results  Component Value Date   HGBA1C 6.8 (A) 09/23/2020   HGBA1C 11.5 (H) 06/03/2020   HGBA1C 12.2 (H) 05/22/2020   Lab Results  Component Value Date  LDLCALC 175 (H) 05/11/2020   CREATININE 1.16 08/18/2020   No results found for: [facility]  Lab Results  Component Value Date   CHOL 286 (H) 05/11/2020   HDL 39 (L) 05/11/2020   LDLCALC 175 (H) 05/11/2020   TRIG 362 (H) 05/11/2020   CHOLHDL 7.3 05/11/2020        ASSESSMENT / PLAN / RECOMMENDATIONS:   1) Type 2 Diabetes Mellitus, Optimally controlled, With macrovascular  complications - Most recent A1c of 6.8 %. Goal A1c < 7.0 %.    Plan: GENERAL: I have discussed with the patient the pathophysiology of diabetes. We went over the natural progression of the disease. We talked about both insulin resistance and insulin deficiency. We stressed the importance of lifestyle changes including diet and exercise. I explained the complications associated with diabetes including retinopathy, nephropathy, neuropathy as well as increased risk of cardiovascular disease. We went over the benefit seen with glycemic control.    I explained to the patient that diabetic patients are at higher than normal risk for amputations.   No personal hx of pancreatitis   Pt has made drastic dietary changes, declined a referral to RD at this time, we briefly discussed options for low carb diet   Since he is having diarrhea with metformin, will switch to extended release  He also admits to not taking insulin on regular basis and would like to take it regularly prior to making any other changes   MEDICATIONS: - Take Lantus 22 units daily  - STOP Metformin 1000 mg  - Start Metformin 500 mg XR , 1 tablet twice daily  - Continue Farxiga 10 mg, 1 tablet with Breakfast   EDUCATION / INSTRUCTIONS:  BG monitoring instructions: Patient is instructed to check his blood sugars 1 times a day.  Call  [facility] if: BG persistently < 70  I reviewed the Rule of 15 for the treatment of hypoglycemia in detail with the patient. Literature supplied.   2) Diabetic complications:   Eye: Does not have known diabetic retinopathy.   Neuro/ Feet: Does not have known diabetic peripheral neuropathy.  Renal: Patient does NOT have known baseline CKD. He is on an ACEI/ARB at present.   3) Dyslipidemia: LDL elevated at 175 mg/dL and Tg elevated at 362 mg/dL .Patient is on 80 mg of Atorvastatin    Medication  Atorvastatin 80 mg daily  Repatha    I spent 45 minutes preparing to see the patient by review of recent labs, imaging and procedures, obtaining and reviewing separately obtained history, communicating with the patient/family or caregiver, ordering medications, tests or procedures, and documenting clinical information in the EHR including the differential Dx, treatment, and any further evaluation and other management     Signed electronically by: Mack Guise, MD  [facility]  [facility] [address] [place], [place] [address] Phone: [phone] FAX: [phone]   CC: Maximiano Coss, NP [place] [address] Phone: [phone]  Fax: [phone]    Return to Endocrinology clinic as below: Future Appointments  Date Time Provider [place]  10/21/2020 10:40 AM Larey Dresser, MD [facility] None  10/30/2020  8:00 PM Sueanne Margarita, MD [facility]  11/09/2020  8:15 AM [facility]

## 2020-09-24 ENCOUNTER — Telehealth: Payer: Self-pay | Admitting: Internal Medicine

## 2020-09-24 DIAGNOSIS — E119 Type 2 diabetes mellitus without complications: Secondary | ICD-10-CM | POA: Insufficient documentation

## 2020-09-24 MED ORDER — DAPAGLIFLOZIN PROPANEDIOL 10 MG PO TABS
10.0000 mg | ORAL_TABLET | Freq: Every day | ORAL | 3 refills | Status: DC
Start: 2020-09-24 — End: 2021-07-23

## 2020-09-24 NOTE — Telephone Encounter (Signed)
Patient requests to be called at ph# [phone]  Re:A1C/Lab results. Patient states he received lab results on MyChart and is confused by the MyChart Lab results versus what Dr. Lonzo Cloud told Patient at visit.

## 2020-09-24 NOTE — Telephone Encounter (Signed)
Spoken to patient. I have explain to patient how lab results look like on MyChart. Patient verbalized understanding.

## 2020-09-28 ENCOUNTER — Other Ambulatory Visit: Payer: Self-pay | Admitting: Cardiovascular Disease

## 2020-09-28 NOTE — Telephone Encounter (Signed)
Pt's medication was sent to pt's pharmacy as requested. Confirmation received.  °

## 2020-10-08 ENCOUNTER — Other Ambulatory Visit: Payer: Self-pay | Admitting: Cardiovascular Disease

## 2020-10-08 NOTE — Telephone Encounter (Signed)
Will route to Dr. Fabio Bering nurse.  Pt had a refill of Nitro sent in on 2/14 with a note to call to make an appt for further refills.  Pt now requesting a second refill.

## 2020-10-08 NOTE — Telephone Encounter (Signed)
Patient will have medications refilled when he sees provider next month. Patient stated he does not need any refills at this time.

## 2020-10-12 ENCOUNTER — Other Ambulatory Visit: Payer: Self-pay | Admitting: Cardiovascular Disease

## 2020-10-13 ENCOUNTER — Other Ambulatory Visit: Payer: Self-pay | Admitting: Cardiovascular Disease

## 2020-10-21 ENCOUNTER — Ambulatory Visit (HOSPITAL_COMMUNITY)
Admission: RE | Admit: 2020-10-21 | Discharge: 2020-10-21 | Disposition: A | Discharge status: Home / Self Care | Payer: Medicare Other | Source: Ambulatory Visit | Attending: Cardiology | Admitting: Cardiology

## 2020-10-21 ENCOUNTER — Encounter (HOSPITAL_COMMUNITY): Payer: Self-pay | Admitting: Cardiology

## 2020-10-21 ENCOUNTER — Other Ambulatory Visit: Payer: Self-pay | Admitting: Cardiovascular Disease

## 2020-10-21 ENCOUNTER — Other Ambulatory Visit: Payer: Self-pay

## 2020-10-21 VITALS — BP 114/80 | HR 77 | Wt 259.8 lb

## 2020-10-21 DIAGNOSIS — E119 Type 2 diabetes mellitus without complications: Secondary | ICD-10-CM | POA: Insufficient documentation

## 2020-10-21 DIAGNOSIS — I25119 Atherosclerotic heart disease of native coronary artery with unspecified angina pectoris: Secondary | ICD-10-CM | POA: Diagnosis not present

## 2020-10-21 DIAGNOSIS — I252 Old myocardial infarction: Secondary | ICD-10-CM | POA: Insufficient documentation

## 2020-10-21 DIAGNOSIS — E785 Hyperlipidemia, unspecified: Secondary | ICD-10-CM | POA: Diagnosis not present

## 2020-10-21 DIAGNOSIS — Z794 Long term (current) use of insulin: Secondary | ICD-10-CM | POA: Diagnosis not present

## 2020-10-21 DIAGNOSIS — Z7982 Long term (current) use of aspirin: Secondary | ICD-10-CM | POA: Insufficient documentation

## 2020-10-21 DIAGNOSIS — Z79899 Other long term (current) drug therapy: Secondary | ICD-10-CM | POA: Diagnosis not present

## 2020-10-21 DIAGNOSIS — G4733 Obstructive sleep apnea (adult) (pediatric): Secondary | ICD-10-CM | POA: Diagnosis not present

## 2020-10-21 DIAGNOSIS — Z87891 Personal history of nicotine dependence: Secondary | ICD-10-CM | POA: Insufficient documentation

## 2020-10-21 DIAGNOSIS — I110 Hypertensive heart disease with heart failure: Secondary | ICD-10-CM | POA: Insufficient documentation

## 2020-10-21 DIAGNOSIS — I5022 Chronic systolic (congestive) heart failure: Secondary | ICD-10-CM | POA: Diagnosis not present

## 2020-10-21 DIAGNOSIS — I255 Ischemic cardiomyopathy: Secondary | ICD-10-CM | POA: Insufficient documentation

## 2020-10-21 DIAGNOSIS — Z8249 Family history of ischemic heart disease and other diseases of the circulatory system: Secondary | ICD-10-CM | POA: Diagnosis not present

## 2020-10-21 DIAGNOSIS — Z7984 Long term (current) use of oral hypoglycemic drugs: Secondary | ICD-10-CM | POA: Insufficient documentation

## 2020-10-21 DIAGNOSIS — Z955 Presence of coronary angioplasty implant and graft: Secondary | ICD-10-CM | POA: Diagnosis not present

## 2020-10-21 LAB — BASIC METABOLIC PANEL
Anion gap: 10 (ref 5–15)
BUN: 13 mg/dL (ref 6–20)
CO2: 23 mmol/L (ref 22–32)
Calcium: 9.7 mg/dL (ref 8.9–10.3)
Chloride: 104 mmol/L (ref 98–111)
Creatinine, Ser: 1.33 mg/dL — ABNORMAL HIGH (ref 0.61–1.24)
GFR, Estimated: >60 mL/min (ref >60)
Glucose, Bld: 112 mg/dL — ABNORMAL HIGH (ref 70–99)
Potassium: 4.4 mmol/L (ref 3.5–5.1)
Sodium: 137 mmol/L (ref 135–145)

## 2020-10-21 LAB — DIGOXIN LEVEL: Digoxin Level: 0.4 ng/mL — ABNORMAL LOW (ref 0.8–2.0)

## 2020-10-21 MED ORDER — BIDIL 20-37.5 MG PO TABS
1.0000 | ORAL_TABLET | Freq: Three times a day (TID) | ORAL | 11 refills | Status: DC
Start: 2020-10-21 — End: 2021-11-12

## 2020-10-21 NOTE — Patient Instructions (Signed)
Labs done today. We will contact you only if your labs are abnormal.  INCREASE Bidil to 1 full tablet by mouth 3 times daily.   No other medication changes were made. Please continue all current medications as prescribed.  Your physician recommends that you schedule a follow-up appointment in: 2 months   If you have any questions or concerns before your next appointment please send [place] a message through [place] or call our office at [phone].    TO LEAVE A MESSAGE FOR THE NURSE SELECT OPTION 2, PLEASE LEAVE A MESSAGE INCLUDING: . YOUR NAME . DATE OF BIRTH . CALL BACK NUMBER . REASON FOR CALL**this is important as we prioritize the call backs  YOU WILL RECEIVE A CALL BACK THE SAME DAY AS LONG AS YOU CALL BEFORE 4:00 PM   Do the following things EVERYDAY: 1) Weigh yourself in the morning before breakfast. Write it down and keep it in a log. 2) Take your medicines as prescribed 3) Eat low salt foods--Limit salt (sodium) to 2000 mg per day.  4) Stay as active as you can everyday 5) Limit all fluids for the day to less than 2 liters   At the [facility], you and your health needs are our priority. As part of our continuing mission to provide you with exceptional heart care, we have created designated Provider Care Teams. These Care Teams include your primary Cardiologist (physician) and Advanced Practice Providers (APPs- Physician Assistants and Nurse Practitioners) who all work together to provide you with the care you need, when you need it.   You may see any of the following providers on your designated Care Team at your next follow up: Marland Kitchen Dr Arvilla Meres . Dr Marca Ancona . Tonye Becket, NP . Robbie Lis, PA . Karle Plumber, PharmD   Please be sure to bring in all your medications bottles to every appointment.

## 2020-10-21 NOTE — Progress Notes (Signed)
PCP: Maximiano Coss, NP Cardiology: Dr. Johnsie Cancel HF Cardiology: Dr. Aundra Dubin  45 y.o. with history of CAD s/p multiple MIs and PCIs, ischemic cardiomyopathy, HTN, active smoking, and diabetes.  Patient has had multiple coronary interventions, fist in 2010.  His RCA is now chronically occluded, and he has had many stents in the LAD.  Echo in 1/18 showed EF 40%.  EF was down to 20-25% in 12/19.  Echo in 7/21 was reported as EF 25-30%, moderate LV enlargement, normal RV.  Of note, patient seen in urgent care in 7/21 and noted to have blood glucose > 400.  HgbA1c was 11.3. He was started on metformin at urgent care with new diagnosis of diabetes.     Given more frequent chest pain and dyspnea with moderate exertion, I took him for LHC/RHC in 10/21.  This showed low output HF with CI 1.48.  His LAD was totally occluded (long segment) with no collaterals, the RCA was totally occluded with R-R collaterals, and the LCx was patent.  He was admitted and started on milrinone, diuresed.  He was weaned off milrinone.  Lifevest was recommended but he refused.  He was seen by cardiac surgery, not a candidate for CABG.  We began LVAD evaluation (active smoker, not transplant candidate).  Echo in 10/21 showed EF 20-25% with severe LV dilation.  CPX (10/21) showed peak VO2 16, VE/VCO2 slope 37, RER 1.13 => moderate HF limitation.   Patient returns for followup of CHF and CAD.  He is really doing quite well.  Psyche is in a better place.  He has not smoked since 10/21.  No chest pain, no dyspnea walking on flat ground.  He is short of breath only with sex. Weight is down 5 lbs.  His sleep study is on Friday.  He has seen Dr. Caryl Comes to discuss ICD.   HgbA1c much better.   Labs (7/21): K 5, creatinine 1.11, glucose 464, hgbA1 11.3 Labs (9/21): LDL 175 Labs (10/21): K 3.9, creatinine 1.0, hgbA1c 12.2 => 11.5 Labs (11/21): K 3.7, creatinine 1.05 Labs (12/21): K 4, creatinine 1.22 Labs (1/22): digoxin 0.2, hgbA1c 6.5, K 4.1,  creatinine 1.16  PMH: 1. CAD:  - BMS to LAD in 2010.  - DES to RCA on 2012 - DES to LCx and LAD in 1/24, complicated by dissection with overlapping DES to mid LAD.  - Anterior STEMI in 1/17 with DES to totally occluded LAD (stent thrombosis).  - 8/17 PCI to apical LAD, RCA noted to be totally occluded with collaterals.  - 3/18 anterior STEMI with PTCA to totally occluded LAD.  - Coronary angiography (10/21): LAD was totally occluded (long segment) with no collaterals, the RCA was totally occluded with R-R collaterals, and the LCx was patent 2. Chronic systolic CHF: Ischemic cardiomyopathy.   - Echo (1/18): EF 40%.  - Echo (12/19): EF 20-25% - Echo (7/21): EF 25-30%, moderate LV enlargement, mild LVH, RV normal.  - RHC (10/21): mean RA 6, PA 47/13, mean PCWP 20, CI 1.48, PVR 2.4 WU - CPX (10/21): peak VO2 16, VE/VCO2 slope 37, RER 1.13 => moderate HF limitation 3. HTN 4. H/o AVNRT 5. Smoking: Active smoker, 1-2 cigs/day.  - PFTs in 10/21 with minimal obstructive disease.  6. OSA: Uses CPAP 7. Type 2 diabetes: Diagnosed in 7/21 8. Carotid dopplers (10/21): 1-39% BICA stenosis.  9. PAD: ABIs (10/21) were mildly decreased.   Social History   Socioeconomic History  . Marital status: Significant Other    Spouse  name: Not on file  . Number of children: 3  . Years of education: Not on file  . Highest education level: Not on file  Occupational History  . Occupation: Aeronautical engineer: [employer]  Tobacco Use  . Smoking status: Former Smoker    Packs/day: 1.00    Years: 24.00    Pack years: 24.00    Quit date: 11/03/2016    Years since quitting: 3.9  . Smokeless tobacco: Never Used  . Tobacco comment: quit on 03/15/16  Vaping Use  . Vaping Use: Never used  Substance and Sexual Activity  . Alcohol use: No  . Drug use: Yes    Types: Marijuana    Comment: last time 02/2016  . Sexual activity: Yes  Other Topics Concern  . Not on file  Social History Narrative   Lives in  [place] by himself.  Sometimes stays with girlfriend in [place].  Does not routinely exercise.   Social Determinants of Health   Financial Resource Strain: Not on file  Food Insecurity: Not on file  Transportation Needs: Not on file  Physical Activity: Not on file  Stress: Not on file  Social Connections: Not on file  Intimate Partner Violence: Not on file   Family History  Problem Relation Age of Onset  . Arrhythmia Mother        MOTHER LIVED TO BE 89  . Coronary artery disease Mother   . Heart attack Mother   . Hypertension Mother   . Coronary artery disease Father 80  . Heart disease Father   . Heart attack Father   . Heart attack Brother   . Stroke Neg Hx    ROS: All systems reviewed and negative except as per HPI.   Current Outpatient Medications  Medication Sig Dispense Refill  . acetaminophen (TYLENOL) 325 MG tablet Take 2 tablets (650 mg total) by mouth every 4 (four) hours as needed for headache or mild pain.    Marland Kitchen aspirin 81 MG EC tablet Take 1 tablet (81 mg total) by mouth daily. 90 tablet 3  . atorvastatin (LIPITOR) 80 MG tablet TAKE 1 TABLET BY MOUTH EVERY DAY AT 6 PM 90 tablet 1  . blood glucose meter kit and supplies Dispense based on patient and insurance preference. Use up to four times daily as directed. (FOR ICD-10 E10.9, E11.9). 1 each 0  . buPROPion (WELLBUTRIN XL) 150 MG 24 hr tablet Take 1 tablet (150 mg total) by mouth daily. 90 tablet 3  . carvedilol (COREG) 3.125 MG tablet Take 1 tablet (3.125 mg total) by mouth 2 (two) times daily. 180 tablet 3  . clotrimazole-betamethasone (LOTRISONE) cream Apply 1 application topically 2 (two) times daily. (Patient taking differently: Apply 1 application topically daily as needed (Rash).) 30 g 0  . dapagliflozin propanediol (FARXIGA) 10 MG TABS tablet Take 1 tablet (10 mg total) by mouth daily before breakfast. 90 tablet 3  . digoxin (LANOXIN) 0.125 MG tablet Take 1 tablet (0.125 mg total) by mouth daily. Please make  overdue appt with Dr. Johnsie Cancel before anymore refills. Thank you 2nd attempt 15 tablet 0  . Evolocumab (REPATHA SURECLICK) 716 MG/ML SOAJ Inject 1 pen into the skin every 14 (fourteen) days. 2 mL 11  . furosemide (LASIX) 40 MG tablet TAKE 1 TABLET BY MOUTH EVERY DAY 90 tablet 3  . insulin glargine (LANTUS SOLOSTAR) 100 UNIT/ML Solostar Pen Inject 22 Units into the skin daily. 15 mL 11  . Insulin Pen Needle (PEN  NEEDLES 3/16") 31G X 5 MM MISC Lantus 23 100 each 1  . metFORMIN (GLUCOPHAGE-XR) 500 MG 24 hr tablet Take 1 tablet (500 mg total) by mouth 2 (two) times daily. 180 tablet 3  . nitroGLYCERIN (NITROSTAT) 0.4 MG SL tablet Place 1 tablet (0.4 mg total) under the tongue every 5 (five) minutes as needed for chest pain. Please make overdue appt with Dr. Johnsie Cancel before anymore refills. Thank you 1st attempt 25 tablet 0  . potassium chloride SA (KLOR-CON) 20 MEQ tablet Take 2 tablets (40 mEq total) by mouth daily. Please make yearly appt with Dr. Johnsie Cancel for January 2022 for future refills. 1st attempt 180 tablet 0  . sacubitril-valsartan (ENTRESTO) 97-103 MG Take 1 tablet by mouth 2 (two) times daily. 60 tablet 11  . spironolactone (ALDACTONE) 25 MG tablet Take 1 tablet (25 mg total) by mouth daily. 90 tablet 3  . triamcinolone (KENALOG) 0.1 % Apply 1 application topically as needed.    . isosorbide-hydrALAZINE (BIDIL) 20-37.5 MG tablet Take 1 tablet by mouth 3 (three) times daily. 90 tablet 11   No current facility-administered medications for this encounter.   BP 114/80   Pulse 77   Wt 117.8 kg (259 lb 12.8 oz)   SpO2 99%   BMI 40.69 kg/m  General: NAD Neck: No JVD, no thyromegaly or thyroid nodule.  Lungs: Clear to auscultation bilaterally with normal respiratory effort. CV: Nondisplaced PMI.  Heart regular S1/S2, no S3/S4, no murmur.  No peripheral edema.  No carotid bruit.  Normal pedal pulses.  Abdomen: Soft, nontender, no hepatosplenomegaly, no distention.  Skin: Intact without lesions or  rashes.  Neurologic: Alert and oriented x 3.  Psych: Normal affect. Extremities: No clubbing or cyanosis.  HEENT: Normal.   Assessment/Plan: 1. CAD: RCA known to be chronically occluded. He has had multiple PCIs to LAD. Repeat LHC in 10/21 done for recurrent angina demonstrated occluded RCA (known from past) and occluded pLAD with no collaterals. His LCx was patent. I do not think we have a revascularization option, multiple overlapping LAD stents, now occluded and there are no good surgical targets. No recent chest pain.   - Continue ASA 81  - Continue Atorvastatin 80  2. Chronic systolic CHF: Ischemic cardiomyopathy. Echo in 10/21 with EF 20-25%, severe LV dilation, mildly decreased RV systolic function. RHC in 10/21 demonstrated elevated PCWP, mild pulmonary venous hypertension and low cardiac output with CI 1.4. Patient was transiently on milrinone but weaned off. CPX in 10/21 showed peak VO2 16, VE/VCO2 slope 37, RER 1.13 => moderate HF limitation.  RHC and CPX do not match.  Symptomatically, he seems more like the CPX.  He describes NYHA class II symptoms currently, doing better over the last few months.   He is not volume overloaded on exam.   - Continue Lasix 40 mg daily, BMET today.   - Continue Entresto 97/103 bid.  - Continue Coreg 3.125 mg bid. - Continue spironolactone 25 daily  - Continue digoxin 0.125 daily, check level.   - Continue Farxiga 10 mg daily.    - Increase Bidil to 1 tab tid.  - He has had LVAD workup.  He had low CO on RHC though symptomatically does not seem that severe, CPX matches symptoms more with moderate HF limitation.  With symptomatic improvement, improved HgbA1c, weight loss, and cessation of smoking, he may end up being a transplant candidate.   If he can continue to lose weight, keep HgbA1c down, and stay off cigarettes for  6 months, will send for transplant evaluation.  If he worsens in the interim, will need LVAD.  - He is not a CRT candidate. We  recommended Lifevest but he refused.  He has seen Dr. Caryl Comes regarding ICD, I recommended that he go ahead and have this implanted. 3. Type 2 diabetes: New diagnosis in 7/21 with hgbA1c 11.3. Most recently, HgbA1c down to 6.5.  - Continue Farxiga 10 mg daily.  4. OSA: Has sleep study this month.   5. Smoking:  He quit in 10/21.  PFTs with minimal obstructive disease.  - Continue Wellbutrin 6. HLD: Goal LDL < 70. He is on atorvastatin 80 and has started Repatha.   Followup in 2 months.  If he has stayed off cigarettes, will refer for transplant evaluation at that time.   Loralie Champagne 10/21/2020

## 2020-10-30 ENCOUNTER — Encounter (HOSPITAL_BASED_OUTPATIENT_CLINIC_OR_DEPARTMENT_OTHER): Payer: Medicare Other | Admitting: Cardiology

## 2020-11-04 ENCOUNTER — Other Ambulatory Visit: Payer: Self-pay | Admitting: Cardiovascular Disease

## 2020-11-09 ENCOUNTER — Other Ambulatory Visit: Payer: Medicare Other | Admitting: *Deleted

## 2020-11-09 ENCOUNTER — Other Ambulatory Visit: Payer: Self-pay

## 2020-11-09 DIAGNOSIS — E785 Hyperlipidemia, unspecified: Secondary | ICD-10-CM

## 2020-11-09 LAB — LIPID PANEL
Chol/HDL Ratio: 4 ratio (ref 0.0–5.0)
Cholesterol, Total: 135 mg/dL (ref 100–199)
HDL: 34 mg/dL — ABNORMAL LOW (ref >39)
LDL Chol Calc (NIH): 16 mg/dL (ref 0–99)
Triglycerides: 671 mg/dL (ref 0–149)
VLDL Cholesterol Cal: 85 mg/dL — ABNORMAL HIGH (ref 5–40)

## 2020-11-09 LAB — HEPATIC FUNCTION PANEL
ALT: 26 IU/L (ref 0–44)
AST: 22 IU/L (ref 0–40)
Albumin: 4.3 g/dL (ref 4.0–5.0)
Alkaline Phosphatase: 97 IU/L (ref 44–121)
Bilirubin Total: <0.2 mg/dL (ref 0.0–1.2)
Bilirubin, Direct: 0.14 mg/dL (ref 0.00–0.40)
Total Protein: 7.4 g/dL (ref 6.0–8.5)

## 2020-11-10 ENCOUNTER — Telehealth: Payer: Self-pay | Admitting: Pharmacist

## 2020-11-10 DIAGNOSIS — E785 Hyperlipidemia, unspecified: Secondary | ICD-10-CM

## 2020-11-10 DIAGNOSIS — Z7189 Other specified counseling: Secondary | ICD-10-CM

## 2020-11-10 MED ORDER — ICOSAPENT ETHYL 1 G PO CAPS: 2.0000 g | ORAL_CAPSULE | Freq: Two times a day (BID) | ORAL | 11 refills | Status: AC

## 2020-11-10 MED ORDER — FENOFIBRATE 160 MG PO TABS: 160.0000 mg | ORAL_TABLET | Freq: Every day | ORAL | 3 refills | Status: DC

## 2020-11-10 NOTE — Telephone Encounter (Addendum)
Contacted patient regarding lipid panel results. Informed patient LDL is 16 and congratulated him on achieving goal <55 mg/dL since adding Repatha. However, triglycerides are extremely elevated at 671. Patient reports medication adherence with atorvastatin 80 mg daily and Repatha 140 mg every 2 weeks. Denies any side effects. Reports previous use of fenofibrate for triglyceride management, however cannot recall why discontinued. Reports no side effects with fenofibrate. Discussed risks associated with elevated triglycerides and need for additional management.   Will start fenofibrate 160 mg daily and Vascepa 2 g BID. Confirmed copay cost of $0 with pharmacy. Discussed diet including minimizing carbs, sugar and alcohol intake. Patient verbalized understanding. Scheduled follow-up fasting lipid panel, direct LDL, and LFTs in 3 months.

## 2020-11-27 ENCOUNTER — Encounter (HOSPITAL_BASED_OUTPATIENT_CLINIC_OR_DEPARTMENT_OTHER): Payer: Medicare Other | Admitting: Cardiology

## 2020-12-18 ENCOUNTER — Encounter (HOSPITAL_BASED_OUTPATIENT_CLINIC_OR_DEPARTMENT_OTHER): Payer: Medicare Other | Admitting: Cardiology

## 2020-12-21 ENCOUNTER — Encounter (HOSPITAL_COMMUNITY): Payer: Medicare Other | Admitting: Cardiology

## 2020-12-24 ENCOUNTER — Encounter (HOSPITAL_COMMUNITY): Payer: Medicare Other | Admitting: Cardiology

## 2020-12-28 ENCOUNTER — Other Ambulatory Visit: Payer: Self-pay

## 2020-12-28 ENCOUNTER — Encounter (HOSPITAL_COMMUNITY): Payer: Self-pay | Admitting: Cardiology

## 2020-12-28 ENCOUNTER — Ambulatory Visit (HOSPITAL_COMMUNITY)
Admission: RE | Admit: 2020-12-28 | Discharge: 2020-12-28 | Disposition: A | Discharge status: Home / Self Care | Payer: Medicare Other | Source: Ambulatory Visit | Attending: Cardiology | Admitting: Cardiology

## 2020-12-28 VITALS — BP 138/80 | HR 68 | Wt 258.4 lb

## 2020-12-28 DIAGNOSIS — Z7984 Long term (current) use of oral hypoglycemic drugs: Secondary | ICD-10-CM | POA: Insufficient documentation

## 2020-12-28 DIAGNOSIS — I110 Hypertensive heart disease with heart failure: Secondary | ICD-10-CM | POA: Insufficient documentation

## 2020-12-28 DIAGNOSIS — Z7901 Long term (current) use of anticoagulants: Secondary | ICD-10-CM | POA: Insufficient documentation

## 2020-12-28 DIAGNOSIS — E785 Hyperlipidemia, unspecified: Secondary | ICD-10-CM | POA: Insufficient documentation

## 2020-12-28 DIAGNOSIS — Z794 Long term (current) use of insulin: Secondary | ICD-10-CM | POA: Insufficient documentation

## 2020-12-28 DIAGNOSIS — I5042 Chronic combined systolic (congestive) and diastolic (congestive) heart failure: Secondary | ICD-10-CM | POA: Diagnosis not present

## 2020-12-28 DIAGNOSIS — Z7982 Long term (current) use of aspirin: Secondary | ICD-10-CM | POA: Insufficient documentation

## 2020-12-28 DIAGNOSIS — Z79899 Other long term (current) drug therapy: Secondary | ICD-10-CM | POA: Insufficient documentation

## 2020-12-28 DIAGNOSIS — Z8249 Family history of ischemic heart disease and other diseases of the circulatory system: Secondary | ICD-10-CM | POA: Diagnosis not present

## 2020-12-28 DIAGNOSIS — I5022 Chronic systolic (congestive) heart failure: Secondary | ICD-10-CM | POA: Diagnosis not present

## 2020-12-28 DIAGNOSIS — E119 Type 2 diabetes mellitus without complications: Secondary | ICD-10-CM | POA: Insufficient documentation

## 2020-12-28 DIAGNOSIS — Z87891 Personal history of nicotine dependence: Secondary | ICD-10-CM | POA: Diagnosis not present

## 2020-12-28 DIAGNOSIS — I25119 Atherosclerotic heart disease of native coronary artery with unspecified angina pectoris: Secondary | ICD-10-CM | POA: Diagnosis not present

## 2020-12-28 DIAGNOSIS — I255 Ischemic cardiomyopathy: Secondary | ICD-10-CM | POA: Insufficient documentation

## 2020-12-28 LAB — BASIC METABOLIC PANEL
Anion gap: 8 (ref 5–15)
BUN: 15 mg/dL (ref 6–20)
CO2: 26 mmol/L (ref 22–32)
Calcium: 9.8 mg/dL (ref 8.9–10.3)
Chloride: 106 mmol/L (ref 98–111)
Creatinine, Ser: 1.22 mg/dL (ref 0.61–1.24)
GFR, Estimated: >60 mL/min (ref >60)
Glucose, Bld: 136 mg/dL — ABNORMAL HIGH (ref 70–99)
Potassium: 4.5 mmol/L (ref 3.5–5.1)
Sodium: 140 mmol/L (ref 135–145)

## 2020-12-28 LAB — DIGOXIN LEVEL: Digoxin Level: <0.2 ng/mL — ABNORMAL LOW (ref 0.8–2.0)

## 2020-12-28 MED ORDER — CARVEDILOL 6.25 MG PO TABS: 6.2500 mg | ORAL_TABLET | Freq: Two times a day (BID) | ORAL | 3 refills | Status: DC

## 2020-12-28 NOTE — Progress Notes (Signed)
PCP: Maximiano Coss, NP Cardiology: Dr. Johnsie Cancel HF Cardiology: Dr. Aundra Dubin  45 y.o. with history of CAD s/p multiple MIs and PCIs, ischemic cardiomyopathy, HTN, active smoking, and diabetes.  Patient has had multiple coronary interventions, fist in 2010.  His RCA is now chronically occluded, and he has had many stents in the LAD.  Echo in 1/18 showed EF 40%.  EF was down to 20-25% in 12/19.  Echo in 7/21 was reported as EF 25-30%, moderate LV enlargement, normal RV.  Of note, patient seen in urgent care in 7/21 and noted to have blood glucose > 400.  HgbA1c was 11.3. He was started on metformin at urgent care with new diagnosis of diabetes.     Given more frequent chest pain and dyspnea with moderate exertion, I took him for LHC/RHC in 10/21.  This showed low output HF with CI 1.48.  His LAD was totally occluded (long segment) with no collaterals, the RCA was totally occluded with R-R collaterals, and the LCx was patent.  He was admitted and started on milrinone, diuresed.  He was weaned off milrinone.  Lifevest was recommended but he refused.  He was seen by cardiac surgery, not a candidate for CABG.  We began LVAD evaluation (active smoker, not transplant candidate).  Echo in 10/21 showed EF 20-25% with severe LV dilation.  CPX (10/21) showed peak VO2 16, VE/VCO2 slope 37, RER 1.13 => moderate HF limitation.   Patient returns for followup of CHF and CAD.  He continues to do well symptomatically.  Able to walk up stairs and walk on flat ground without dyspnea.  He is walking 2 miles on a treadmill most days and lifts some weights.  No lightheadedness.  No chest pain.  Under stress right now as 3 members of his family passed away recently.  He is not smoking.   Labs (7/21): K 5, creatinine 1.11, glucose 464, hgbA1 11.3 Labs (9/21): LDL 175 Labs (10/21): K 3.9, creatinine 1.0, hgbA1c 12.2 => 11.5 Labs (11/21): K 3.7, creatinine 1.05 Labs (12/21): K 4, creatinine 1.22 Labs (1/22): digoxin 0.2, hgbA1c  6.5, K 4.1, creatinine 1.16 Labs (3/22): LDL 16, TGs 671  PMH: 1. CAD:  - BMS to LAD in 2010.  - DES to RCA on 2012 - DES to LCx and LAD in 5/70, complicated by dissection with overlapping DES to mid LAD.  - Anterior STEMI in 1/17 with DES to totally occluded LAD (stent thrombosis).  - 8/17 PCI to apical LAD, RCA noted to be totally occluded with collaterals.  - 3/18 anterior STEMI with PTCA to totally occluded LAD.  - Coronary angiography (10/21): LAD was totally occluded (long segment) with no collaterals, the RCA was totally occluded with R-R collaterals, and the LCx was patent 2. Chronic systolic CHF: Ischemic cardiomyopathy.   - Echo (1/18): EF 40%.  - Echo (12/19): EF 20-25% - Echo (7/21): EF 25-30%, moderate LV enlargement, mild LVH, RV normal.  - RHC (10/21): mean RA 6, PA 47/13, mean PCWP 20, CI 1.48, PVR 2.4 WU - CPX (10/21): peak VO2 16, VE/VCO2 slope 37, RER 1.13 => moderate HF limitation 3. HTN 4. H/o AVNRT 5. Smoking: Active smoker, 1-2 cigs/day.  - PFTs in 10/21 with minimal obstructive disease.  6. OSA: Uses CPAP 7. Type 2 diabetes: Diagnosed in 7/21 8. Carotid dopplers (10/21): 1-39% BICA stenosis.  9. PAD: ABIs (10/21) were mildly decreased.  10. Hyperlipidemia  Social History   Socioeconomic History  . Marital status: Significant Other  Spouse name: Not on file  . Number of children: 3  . Years of education: Not on file  . Highest education level: Not on file  Occupational History  . Occupation: Aeronautical engineer: [employer]  Tobacco Use  . Smoking status: Former Smoker    Packs/day: 1.00    Years: 24.00    Pack years: 24.00    Quit date: 11/03/2016    Years since quitting: 4.1  . Smokeless tobacco: Never Used  . Tobacco comment: quit on 03/15/16  Vaping Use  . Vaping Use: Never used  Substance and Sexual Activity  . Alcohol use: No  . Drug use: Yes    Types: Marijuana    Comment: last time 02/2016  . Sexual activity: Yes  Other Topics  Concern  . Not on file  Social History Narrative   Lives in [place] by himself.  Sometimes stays with girlfriend in [place].  Does not routinely exercise.   Social Determinants of Health   Financial Resource Strain: Not on file  Food Insecurity: Not on file  Transportation Needs: Not on file  Physical Activity: Not on file  Stress: Not on file  Social Connections: Not on file  Intimate Partner Violence: Not on file   Family History  Problem Relation Age of Onset  . Arrhythmia Mother        MOTHER LIVED TO BE 102  . Coronary artery disease Mother   . Heart attack Mother   . Hypertension Mother   . Coronary artery disease Father 59  . Heart disease Father   . Heart attack Father   . Heart attack Brother   . Stroke Neg Hx    ROS: All systems reviewed and negative except as per HPI.   Current Outpatient Medications  Medication Sig Dispense Refill  . acetaminophen (TYLENOL) 325 MG tablet Take 2 tablets (650 mg total) by mouth every 4 (four) hours as needed for headache or mild pain.    Marland Kitchen aspirin 81 MG EC tablet Take 1 tablet (81 mg total) by mouth daily. 90 tablet 3  . atorvastatin (LIPITOR) 80 MG tablet TAKE 1 TABLET BY MOUTH EVERY DAY AT 6 PM 90 tablet 1  . blood glucose meter kit and supplies Dispense based on patient and insurance preference. Use up to four times daily as directed. (FOR ICD-10 E10.9, E11.9). 1 each 0  . buPROPion (WELLBUTRIN XL) 150 MG 24 hr tablet TAKE 1 TABLET BY MOUTH EVERY DAY 90 tablet 3  . clotrimazole-betamethasone (LOTRISONE) cream Apply 1 application topically 2 (two) times daily. (Patient taking differently: Apply 1 application topically daily as needed (Rash).) 30 g 0  . dapagliflozin propanediol (FARXIGA) 10 MG TABS tablet Take 1 tablet (10 mg total) by mouth daily before breakfast. 90 tablet 3  . digoxin (LANOXIN) 0.125 MG tablet Take 1 tablet (0.125 mg total) by mouth daily. Please make overdue appt with Dr. Johnsie Cancel before anymore refills. Thank you  2nd attempt 15 tablet 0  . Evolocumab (REPATHA SURECLICK) 384 MG/ML SOAJ Inject 1 pen into the skin every 14 (fourteen) days. 2 mL 11  . fenofibrate 160 MG tablet Take 1 tablet (160 mg total) by mouth daily. 90 tablet 3  . furosemide (LASIX) 40 MG tablet TAKE 1 TABLET BY MOUTH EVERY DAY 90 tablet 3  . icosapent Ethyl (VASCEPA) 1 g capsule Take 2 capsules (2 g total) by mouth 2 (two) times daily. 120 capsule 11  . insulin glargine (LANTUS SOLOSTAR) 100 UNIT/ML  Solostar Pen Inject 22 Units into the skin daily. 15 mL 11  . Insulin Pen Needle (PEN NEEDLES 3/16") 31G X 5 MM MISC Lantus 23 100 each 1  . isosorbide-hydrALAZINE (BIDIL) 20-37.5 MG tablet Take 1 tablet by mouth 3 (three) times daily. 90 tablet 11  . metFORMIN (GLUCOPHAGE-XR) 500 MG 24 hr tablet Take 1 tablet (500 mg total) by mouth 2 (two) times daily. 180 tablet 3  . nitroGLYCERIN (NITROSTAT) 0.4 MG SL tablet Place 1 tablet (0.4 mg total) under the tongue every 5 (five) minutes as needed for chest pain. Please make overdue appt with Dr. Johnsie Cancel before anymore refills. Thank you 1st attempt 25 tablet 0  . potassium chloride SA (KLOR-CON) 20 MEQ tablet Take 2 tablets (40 mEq total) by mouth daily. Please make yearly appt with Dr. Johnsie Cancel for January 2022 for future refills. 1st attempt 180 tablet 0  . sacubitril-valsartan (ENTRESTO) 97-103 MG Take 1 tablet by mouth 2 (two) times daily. 60 tablet 11  . spironolactone (ALDACTONE) 25 MG tablet Take 1 tablet (25 mg total) by mouth daily. 90 tablet 3  . triamcinolone cream (KENALOG) 0.1 % Apply 1 application topically as needed.    . carvedilol (COREG) 6.25 MG tablet Take 1 tablet (6.25 mg total) by mouth 2 (two) times daily. 180 tablet 3   No current facility-administered medications for this encounter.   BP 138/80   Pulse 68   Wt 117.2 kg (258 lb 6.4 oz)   SpO2 98%   BMI 40.47 kg/m  General: NAD Neck: No JVD, no thyromegaly or thyroid nodule.  Lungs: Clear to auscultation bilaterally with  normal respiratory effort. CV: Nondisplaced PMI.  Heart regular S1/S2, no S3/S4, no murmur.  No peripheral edema.  No carotid bruit.  Normal pedal pulses.  Abdomen: Soft, nontender, no hepatosplenomegaly, no distention.  Skin: Intact without lesions or rashes.  Neurologic: Alert and oriented x 3.  Psych: Normal affect. Extremities: No clubbing or cyanosis.  HEENT: Normal.   Assessment/Plan: 1. CAD: RCA known to be chronically occluded. He has had multiple PCIs to LAD. Repeat LHC in 10/21 done for recurrent angina demonstrated occluded RCA (known from past) and occluded pLAD with no collaterals. His LCx was patent. I do not think we have a revascularization option, multiple overlapping LAD stents, now occluded and there are no good surgical targets. No recent chest pain.   - Continue ASA 81  - Continue Atorvastatin 80  2. Chronic systolic CHF: Ischemic cardiomyopathy. Echo in 10/21 with EF 20-25%, severe LV dilation, mildly decreased RV systolic function. RHC in 10/21 demonstrated elevated PCWP, mild pulmonary venous hypertension and low cardiac output with CI 1.4. Patient was transiently on milrinone but weaned off. CPX in 10/21 showed peak VO2 16, VE/VCO2 slope 37, RER 1.13 => moderate HF limitation.  RHC and CPX do not match.  Symptomatically, he seems more like the CPX.  He describes NYHA class II symptoms at most currently, doing better over the last few months.   He is not volume overloaded on exam.   - Continue Lasix 40 mg daily, BMET today.   - Continue Entresto 97/103 bid.  - Increase Coreg to 6.25 mg bid.  - Continue spironolactone 25 daily  - Continue digoxin 0.125 daily, check level.   - Continue Farxiga 10 mg daily.    - Continue Bidil 1 tab tid.  - He has had LVAD workup in the past.  He had low CO on RHC though symptomatically does not seem  that severe, CPX matches symptoms more with moderate HF limitation.  With symptomatic improvement, improved HgbA1c, weight loss, and  cessation of smoking, he may end up being a transplant candidate. Right now, he has minimal symptoms, and I think we can continue to follow without advanced therapies.  - He is not a CRT candidate. We recommended Lifevest but he refused.  He has seen Dr. Caryl Comes regarding ICD; I recommended again that he go ahead and have ICD placed, but he is nervous about this and wants to think more about it. 3. Type 2 diabetes: New diagnosis in 7/21 with hgbA1c 11.3. Most recently, HgbA1c down to 6.5.  - Continue Farxiga 10 mg daily.  4. OSA: Sleep study rescheduled to next month.  5. Smoking:  He quit in 10/21.  PFTs with minimal obstructive disease.  - Continue Wellbutrin 6. Hyperlipidemia: LDL is at goal on Repatha and atorvastatin.  TGs high when last checked, now on Vascepa and fenofibrate.  Followed by lipid clinic.  - Repeat lipids in a month or so, lipid clinic is following.   Followup with HF pharmacist in 3 wks for med titration and see me in 2 months with echo.   Loralie Champagne 12/28/2020

## 2020-12-28 NOTE — Patient Instructions (Signed)
Increase Carvedilol to 6.25 mg Twice daily   Labs done today, your results will be available in MyChart, we will contact you for abnormal readings.  Please follow up with our heart failure pharmacist in 3-4 weeks  Your physician recommends that you schedule a follow-up appointment in: 2-3 months with echocardiogram  If you have any questions or concerns before your next appointment please send [place] a message through [place] or call our office at [phone].    TO LEAVE A MESSAGE FOR THE NURSE SELECT OPTION 2, PLEASE LEAVE A MESSAGE INCLUDING: . YOUR NAME . DATE OF BIRTH . CALL BACK NUMBER . REASON FOR CALL**this is important as we prioritize the call backs  YOU WILL RECEIVE A CALL BACK THE SAME DAY AS LONG AS YOU CALL BEFORE 4:00 PM  At [facility], you and your health needs are our priority. As part of our continuing mission to provide you with exceptional heart care, we have created designated Provider Care Teams. These Care Teams include your primary Cardiologist (physician) and Advanced Practice Providers (APPs- Physician Assistants and Nurse Practitioners) who all work together to provide you with the care you need, when you need it.   You may see any of the following providers on your designated Care Team at your next follow up: Marland Kitchen Dr Arvilla Meres . Dr Marca Ancona . Dr Thornell Mule . Tonye Becket, NP . Robbie Lis, PA . Shanda Bumps [place],NP . Karle Plumber, PharmD   Please be sure to bring in all your medications bottles to every appointment.

## 2021-01-04 ENCOUNTER — Other Ambulatory Visit: Payer: Self-pay | Admitting: Cardiovascular Disease

## 2021-01-22 ENCOUNTER — Ambulatory Visit (HOSPITAL_BASED_OUTPATIENT_CLINIC_OR_DEPARTMENT_OTHER): Payer: Medicare Other | Attending: Cardiology | Admitting: Cardiology

## 2021-01-22 ENCOUNTER — Other Ambulatory Visit: Payer: Self-pay

## 2021-01-22 VITALS — Ht 67.0 in | Wt 250.0 lb

## 2021-01-22 DIAGNOSIS — G4733 Obstructive sleep apnea (adult) (pediatric): Secondary | ICD-10-CM | POA: Diagnosis present

## 2021-01-22 DIAGNOSIS — I493 Ventricular premature depolarization: Secondary | ICD-10-CM | POA: Insufficient documentation

## 2021-01-24 NOTE — Procedures (Signed)
Patient Name: Peter Russo, Peter Russo Date: 01/22/2021 Gender: Male D.O.B: 12/04/1975 Age (years): 44 Referring Provider: Virl Axe Height (inches): 29 Interpreting Physician: Fransico Him MD, ABSM Weight (lbs): 250 RPSGT: Baxter Flattery BMI: 39 MRN: [id number] Neck Size: 19.50  CLINICAL INFORMATION Sleep Study Type: Split Night CPAP  Indication for sleep study: Obesity, OSA, Snoring, Witnessed Apneas  Epworth Sleepiness Score: 3  SLEEP STUDY TECHNIQUE As per the AASM Manual for the Scoring of Sleep and Associated Events v2.3 (April 2016) with a hypopnea requiring 4% desaturations.  The channels recorded and monitored were frontal, central and occipital EEG, electrooculogram (EOG), submentalis EMG (chin), nasal and oral airflow, thoracic and abdominal wall motion, anterior tibialis EMG, snore microphone, electrocardiogram, and pulse oximetry. Continuous positive airway pressure (CPAP) was initiated when the patient met split night criteria and was titrated according to treat sleep-disordered breathing.  MEDICATIONS Medications self-administered by patient taken the night of the study : N/A  RESPIRATORY PARAMETERS Diagnostic Total AHI (/hr): 38.2  RDI (/hr):39.5  OA Index (/hr): 18.1  CA Index (/hr): 0.0 REM AHI (/hr): 44.3  NREM AHI (/hr):37.1  Supine AHI (/hr):48.0  Non-supine AHI (/hr):25.9 Min O2 Sat (%):85.0  Mean O2 (%): 93.5  Time below 88% (min):2   Titration Optimal Pressure (cm):12  AHI at Optimal Pressure (/hr):0  Min O2 at Optimal Pressure (%):94.0 Supine % at Optimal (%):0  Sleep % at Optimal (%):6   SLEEP ARCHITECTURE The recording time for the entire night was 400.6 minutes.  During a baseline period of 203.7 minutes, the patient slept for 146.0 minutes in REM and nonREM, yielding a sleep efficiency of 71.7%. Sleep onset after lights out was 5.6 minutes with a REM latency of 62.5 minutes. The patient spent 14.0% of the night in stage N1 sleep,  70.2% in stage N2 sleep, 0.0% in stage N3 and 15.8% in REM.  During the titration period of 193.2 minutes, the patient slept for 188.5 minutes in REM and nonREM, yielding a sleep efficiency of 97.6%. Sleep onset after CPAP initiation was 0.2 minutes with a REM latency of 23.5 minutes. The patient spent 1.9% of the night in stage N1 sleep, 71.9% in stage N2 sleep, 0.0% in stage N3 and 26.3% in REM.  CARDIAC DATA The 2 lead EKG demonstrated sinus rhythm. The mean heart rate was 100.0 beats per minute. Other EKG findings include: PVCs and PACs.  LEG MOVEMENT DATA The total Periodic Limb Movements of Sleep (PLMS) were 0. The PLMS index was 0.0 .  IMPRESSIONS - Severe obstructive sleep apnea occurred during the diagnostic portion of the study (AHI = 38.2/hour). An optimal PAP pressure was selected for this patient ( 12 cm of water) - Mild oxygen desaturation was noted during the diagnostic portion of the study (Min O2 = 85.0%). - No snoring was audible during the diagnostic portion of the study. - EKG findings include PVCs and PACs. - Clinically significant periodic limb movements did not occur during sleep.  DIAGNOSIS - Obstructive Sleep Apnea (G47.33)  RECOMMENDATIONS - Trial of CPAP therapy on 12 cm H2O with a Small Cushion size Philips Respironics Minimal Contact Dreamwear Under Nose Frame (M) mask and heated humidification. - Avoid alcohol, sedatives and other CNS depressants that may worsen sleep apnea and disrupt normal sleep architecture. - Sleep hygiene should be reviewed to assess factors that may improve sleep quality. - Weight management and regular exercise should be initiated or continued. - Return to Sleep Center for re-evaluation after 6 weeks of therapy  [  Electronically signed] 01/24/2021 01:40 PM  Fransico Him MD, ABSM Diplomate, American Board of Sleep Medicine

## 2021-01-25 ENCOUNTER — Other Ambulatory Visit (HOSPITAL_BASED_OUTPATIENT_CLINIC_OR_DEPARTMENT_OTHER): Payer: Self-pay

## 2021-01-25 DIAGNOSIS — G4733 Obstructive sleep apnea (adult) (pediatric): Secondary | ICD-10-CM

## 2021-01-27 ENCOUNTER — Ambulatory Visit: Payer: Medicare Other | Admitting: Internal Medicine

## 2021-01-27 ENCOUNTER — Other Ambulatory Visit: Payer: Self-pay | Admitting: Cardiovascular Disease

## 2021-02-01 ENCOUNTER — Other Ambulatory Visit: Payer: Self-pay

## 2021-02-01 ENCOUNTER — Other Ambulatory Visit: Payer: Medicare Other

## 2021-02-01 DIAGNOSIS — Z7189 Other specified counseling: Secondary | ICD-10-CM

## 2021-02-01 DIAGNOSIS — E785 Hyperlipidemia, unspecified: Secondary | ICD-10-CM

## 2021-02-01 LAB — HEPATIC FUNCTION PANEL
ALT: 16 IU/L (ref 0–44)
AST: 10 IU/L (ref 0–40)
Albumin: 4.4 g/dL (ref 4.0–5.0)
Alkaline Phosphatase: 77 IU/L (ref 44–121)
Bilirubin Total: 0.2 mg/dL (ref 0.0–1.2)
Bilirubin, Direct: <0.1 mg/dL (ref 0.00–0.40)
Total Protein: 7.1 g/dL (ref 6.0–8.5)

## 2021-02-01 LAB — LIPID PANEL
Chol/HDL Ratio: 2.6 ratio (ref 0.0–5.0)
Cholesterol, Total: 121 mg/dL (ref 100–199)
HDL: 47 mg/dL (ref >39)
LDL Chol Calc (NIH): 47 mg/dL (ref 0–99)
Triglycerides: 161 mg/dL — ABNORMAL HIGH (ref 0–149)
VLDL Cholesterol Cal: 27 mg/dL (ref 5–40)

## 2021-02-01 LAB — LDL CHOLESTEROL, DIRECT: LDL Direct: 51 mg/dL (ref 0–99)

## 2021-02-02 ENCOUNTER — Telehealth: Payer: Self-pay | Admitting: *Deleted

## 2021-02-02 ENCOUNTER — Telehealth: Payer: Self-pay | Admitting: Pharmacist

## 2021-02-02 NOTE — Telephone Encounter (Signed)
Informed patient of sleep study results and patient understanding was verbalized.  Upon patient request DME selection is [facility]. Patient understands she/he will be contacted by [facility] to set up her/he cpap. Patient understands to call if [facility] does not contact her/he with new setup in a timely manner. Patient understands they will be called once confirmation has been received from [facility] that they have received their new machine to schedule 10 week follow up appointment.   [facility] notified of new cpap order  Please add to airview Patient was grateful for the call and thanked me.

## 2021-02-02 NOTE — Telephone Encounter (Signed)
-----   Message from Quintella Reichert, MD sent at 01/24/2021  1:43 PM EDT ----- Please let patient know that they have sleep apnea with successful PAP titration.  PAP ordered placed in Epic.  Followup in 6 weeks after starting PAP therapy

## 2021-02-02 NOTE — Telephone Encounter (Signed)
Contacted patient regarding lipid panel results. Direct LDL 51 remains at goal <55 mg/dL. Reports medication adherence with atorvastatin 80 mg daily and Repatha 140 mg every 2 weeks. Denies any side effects. Current TG 161 (down from 671) since adding fenofibrate 160 mg daily and Vascepa 2 g BID. Reports medication adherence and no side effects. LFTs wnl. Encouraged patient to continue to take medications as prescribed and eat a heart-healthy diet while minimizing carbs, sugar, and alcohol intake. Patient verbalized understanding.

## 2021-02-18 ENCOUNTER — Ambulatory Visit (HOSPITAL_COMMUNITY): Payer: Medicare Other

## 2021-02-22 ENCOUNTER — Other Ambulatory Visit: Payer: Self-pay | Admitting: Cardiovascular Disease

## 2021-02-23 ENCOUNTER — Other Ambulatory Visit (HOSPITAL_COMMUNITY): Payer: Self-pay | Admitting: *Deleted

## 2021-02-23 MED ORDER — NITROGLYCERIN 0.4 MG SL SUBL: 0.4000 mg | SUBLINGUAL_TABLET | SUBLINGUAL | 3 refills | Status: DC | PRN

## 2021-02-27 ENCOUNTER — Other Ambulatory Visit: Payer: Self-pay | Admitting: Cardiovascular Disease

## 2021-03-04 ENCOUNTER — Encounter (HOSPITAL_COMMUNITY): Payer: Medicare Other | Admitting: Cardiology

## 2021-03-04 ENCOUNTER — Other Ambulatory Visit (HOSPITAL_COMMUNITY): Payer: Medicare Other

## 2021-03-19 ENCOUNTER — Other Ambulatory Visit: Payer: Self-pay

## 2021-03-19 ENCOUNTER — Ambulatory Visit (INDEPENDENT_AMBULATORY_CARE_PROVIDER_SITE_OTHER): Payer: Medicare Other | Admitting: Internal Medicine

## 2021-03-19 ENCOUNTER — Encounter: Payer: Self-pay | Admitting: Internal Medicine

## 2021-03-19 ENCOUNTER — Telehealth: Payer: Self-pay | Admitting: Internal Medicine

## 2021-03-19 VITALS — BP 130/78 | HR 87 | Wt 260.6 lb

## 2021-03-19 DIAGNOSIS — E1159 Type 2 diabetes mellitus with other circulatory complications: Secondary | ICD-10-CM | POA: Diagnosis not present

## 2021-03-19 LAB — HEMOGLOBIN A1C: Hgb A1c MFr Bld: 7.5 % — ABNORMAL HIGH (ref 4.6–6.5)

## 2021-03-19 LAB — BASIC METABOLIC PANEL
BUN: 18 mg/dL (ref 6–23)
CO2: 26 mEq/L (ref 19–32)
Calcium: 9.5 mg/dL (ref 8.4–10.5)
Chloride: 105 mEq/L (ref 96–112)
Creatinine, Ser: 1.43 mg/dL (ref 0.40–1.50)
GFR: 58.55 mL/min — ABNORMAL LOW (ref >60.00)
Glucose, Bld: 132 mg/dL — ABNORMAL HIGH (ref 70–99)
Potassium: 4.2 mEq/L (ref 3.5–5.1)
Sodium: 138 mEq/L (ref 135–145)

## 2021-03-19 LAB — MICROALBUMIN / CREATININE URINE RATIO
Creatinine,U: 58.3 mg/dL
Microalb Creat Ratio: 72.8 mg/g — ABNORMAL HIGH (ref 0.0–30.0)
Microalb, Ur: 42.4 mg/dL — ABNORMAL HIGH (ref 0.0–1.9)

## 2021-03-19 MED ORDER — LANTUS SOLOSTAR 100 UNIT/ML ~~LOC~~ SOPN: 26.0000 [IU] | PEN_INJECTOR | Freq: Every day | SUBCUTANEOUS | 4 refills | Status: DC

## 2021-03-19 NOTE — Telephone Encounter (Signed)
Called and advised of changes of medications to increase his lantus 26 units daily and continue with the same dose of his Metformin and Farxgia  as before, pt understood results without any concerns.

## 2021-03-19 NOTE — Patient Instructions (Signed)
-   Continue  Lantus 22 units daily  - Continue Metformin 500 mg XR , 1 tablet twice daily  - Continue Farxiga 10 mg, 1 tablet with Breakfast      HOW TO TREAT LOW BLOOD SUGARS (Blood sugar LESS THAN 70 MG/DL) Please follow the RULE OF 15 for the treatment of hypoglycemia treatment (when your (blood sugars are less than 70 mg/dL)   STEP 1: Take 15 grams of carbohydrates when your blood sugar is low, which includes:  3-4 GLUCOSE TABS  OR 3-4 OZ OF JUICE OR REGULAR SODA OR ONE TUBE OF GLUCOSE GEL    STEP 2: RECHECK blood sugar in 15 MINUTES STEP 3: If your blood sugar is still low at the 15 minute recheck --> then, go back to STEP 1 and treat AGAIN with another 15 grams of carbohydrates.

## 2021-03-19 NOTE — Progress Notes (Signed)
Name: Peter Russo  Age/ Sex: 45 y.o., male   MRN/ DOB: [id number], October 24, 1975     PCP: Maximiano Coss, NP   Reason for Endocrinology Evaluation: Type 2 Diabetes Mellitus  Initial Endocrine Consultative Visit: 09/23/2020    PATIENT IDENTIFIER: [name] is a 45 y.o. male with a past medical history of T2Dm, CAD ( S/P PCI), CHF, and dyslipidemia The patient has followed with Endocrinology clinic since 09/23/2020 for consultative assistance with management of his diabetes.  DIABETIC HISTORY:  Peter Russo was diagnosed with DM 02/2020. His hemoglobin A1c has ranged from 11.3% in7/2021, peaking at 12.2% in 2021.  ON his initial visit to our clinic he had an A1c of 6.8% , we switched Metformin to XR , continued Lantus and Farxiga   SUBJECTIVE:   During the last visit (09/23/2020): A1c 6.8% we switched Metformin to XR , continued Lantus and [place]   Today (03/19/2021): Peter Russo is here for diabetes management.  He checks his blood sugars 1 times every 2 weeks. . The patient has not had hypoglycemic episodes since the last clinic visit.  He is not sure if the Metformin XR is better or not   HOME DIABETES REGIMEN:  Lantus 22 units daily Metformin 500 mg XR , 1 tablet twice daily Farxiga 10 mg, 1 tablet with Breakfast     Statin: yes ACE-I/ARB: yes    METER DOWNLOAD SUMMARY: Did not bring     DIABETIC COMPLICATIONS: Microvascular complications:   Denies: CKD, retinopathy, neuropathy  Last Eye Exam: Completed 09/2020  Macrovascular complications:  CAD ( S/P PCI) , CHF  Denies: CVA, PVD   HISTORY:  Past Medical History:  Past Medical History:  Diagnosis Date   Anxiety    CAD S/P percutaneous coronary angioplasty    a. s/p BMS-mLAD 2010. b. DES to RCA 2012. c. 04/2015: DES to LCx and PCI to LAD c/b dissection with subsequent overlapping DES to mLAD - r/i for NSTEMI afterwards; d. 08/2015 Ant STEMI in setting of noncompliance->178mAD ISR (3.0x16 Synergy  DES); e. 03/2016 PCI of apical LAD; f. 03/2016 Relook Cath: patent LAD stents, RCA 100p CTO-->Med Rx; g. 10/2016 Ant STEMI: PTCA 100p LAD.   Chronic combined systolic and diastolic CHF (congestive heart failure) ([facility]    a. 03/2016 Echo: EF 30-35%, Gr2 DD;  b. 08/2016 Echo: EF 40%.   Depression    Essential hypertension    GAD (generalized anxiety disorder)    GERD (gastroesophageal reflux disease)    Hypercholesteremia    Ischemic cardiomyopathy    a. prior EF 25-35 percent at cath, 35-40 percent by echo; b. 03/2016 Echo: EF 30-35%, diff HK, Gr2 DD; c. 08/2016 Echo: EF 40%, base/mid inferior/inferowseptal AK, mildly dil LA.   Morbid obesity ([place] [place]    Pre-diabetes    SVT ([place]ricular tachycardia) [place] [facility] 10 [place]   PRESUMED AVNRT, ECHO 12/07 WITH EF 65%, MILD LAE   Tobacco abuse    a. 20 + pack years, quit 10/2016.   Past Surgical History:  Past Surgical History:  Procedure Laterality Date   CARDIAC CATHETERIZATION N/A 04/23/2015   Procedure: Left Heart Cath and Coronary Angiography;  Surgeon: PJosue Hector MD;  [facility];  Service: Cardiovascular;  Laterality: N/A;   CARDIAC CATHETERIZATION N/A 04/23/2015   Procedure: Coronary Stent Intervention;  Surgeon: MSherren Mocha MD;  [facility];  Service: Cardiovascular;  Laterality: N/A;   CARDIAC CATHETERIZATION N/A 09/14/2015   Procedure: Left  Heart Cath and Coronary Angiography;  Surgeon: Belva Crome, MD;  LAD 100%, CFX 10% ISR, RCA 100% (chronic), EF 25-35%   CARDIAC CATHETERIZATION N/A 09/14/2015   Procedure: Coronary Stent Intervention;  Surgeon: Belva Crome, MD;  Location: [place] [facility];  Service: Cardiovascular;  Laterality: N/A; 3.0 x 16 mm Synergy DES LAD   CARDIAC CATHETERIZATION  03/16/2016   CARDIAC CATHETERIZATION N/A 03/16/2016   Procedure: Left Heart Cath and Coronary Angiography;  Surgeon: Belva Crome, MD;  Location: [place] [facility];  Service: Cardiovascular;  Laterality: N/A;    CARDIAC CATHETERIZATION N/A 03/16/2016   Procedure: Coronary Balloon Angioplasty;  Surgeon: Belva Crome, MD;  Location: [place] [facility];  Service: Cardiovascular;  Laterality: N/A;   CARDIAC CATHETERIZATION N/A 03/25/2016   Procedure: Left Heart Cath and Coronary Angiography;  Surgeon: Peter M Martinique, MD;  Location: [place] [facility];  Service: Cardiovascular;  Laterality: N/A;   CORONARY ANGIOPLASTY WITH STENT PLACEMENT  2010; 2013   CORONARY BALLOON ANGIOPLASTY N/A 10/19/2016   Procedure: Coronary Balloon Angioplasty;  Surgeon: Jettie Booze, MD;  Location: [place] [facility];  Service: Cardiovascular;  Laterality: N/A;   INTRAVASCULAR ULTRASOUND/IVUS N/A 10/19/2016   Procedure: Intravascular Ultrasound/IVUS;  Surgeon: Jettie Booze, MD;  Location: [facility];  Service: Cardiovascular;  Laterality: N/A;   LEFT HEART CATH AND CORONARY ANGIOGRAPHY N/A 10/19/2016   Procedure: Left Heart Cath and Coronary Angiography;  Surgeon: Jettie Booze, MD;  Location: [place];  Service: Cardiovascular;  Laterality: N/A;   RIGHT/LEFT HEART CATH AND CORONARY ANGIOGRAPHY N/A 05/21/2020   Procedure: RIGHT/LEFT HEART CATH AND CORONARY ANGIOGRAPHY;  Surgeon: Larey Dresser, MD;  Location: [facility] [facility];  Service: Cardiovascular;  Laterality: N/A;   Social History:  reports that he quit smoking about 4 years ago. He has a 24.00 pack-year smoking history. He has never used smokeless tobacco. He reports current drug use. Drug: Marijuana. He reports that he does not drink alcohol. Family History:  Family History  Problem Relation Age of Onset   Arrhythmia Mother        MOTHER LIVED TO BE 106   Coronary artery disease Mother    Heart attack Mother    Hypertension Mother    Coronary artery disease Father 20   Heart disease Father    Heart attack Father    Heart attack Brother    Stroke Neg Hx      HOME MEDICATIONS: Allergies as of 03/19/2021   No Known Allergies       Medication List        Accurate as of March 19, 2021  1:23 PM. If you have any questions, ask your nurse or doctor.          acetaminophen 325 MG tablet Commonly known as: TYLENOL Take 2 tablets (650 mg total) by mouth every 4 (four) hours as needed for headache or mild pain.   aspirin 81 MG EC tablet Take 1 tablet (81 mg total) by mouth daily.   atorvastatin 80 MG tablet Commonly known as: LIPITOR TAKE 1 TABLET BY MOUTH EVERY DAY AT 6 PM   BiDil 20-37.5 MG tablet Generic drug: isosorbide-hydrALAZINE Take 1 tablet by mouth 3 (three) times daily.   blood glucose meter kit and supplies Dispense based on patient and insurance preference. Use up to four times daily as directed. (FOR ICD-10 E10.9, E11.9).   buPROPion 150 MG 24 hr tablet Commonly known  as: WELLBUTRIN XL TAKE 1 TABLET BY MOUTH EVERY DAY   carvedilol 6.25 MG tablet Commonly known as: COREG Take 1 tablet (6.25 mg total) by mouth 2 (two) times daily.   clotrimazole-betamethasone cream Commonly known as: Lotrisone Apply 1 application topically 2 (two) times daily. What changed:  when to take this reasons to take this   dapagliflozin propanediol 10 MG Tabs tablet Commonly known as: Farxiga Take 1 tablet (10 mg total) by mouth daily before breakfast.   digoxin 0.125 MG tablet Commonly known as: LANOXIN TAKE 1 TABLET (0.125 MG TOTAL) BY MOUTH DAILY. PLEASE MAKE OVERDUE APPT BEFORE REFILLING   Entresto 97-103 MG Generic drug: sacubitril-valsartan Take 1 tablet by mouth 2 (two) times daily.   fenofibrate 160 MG tablet Take 1 tablet (160 mg total) by mouth daily.   furosemide 40 MG tablet Commonly known as: LASIX TAKE 1 TABLET BY MOUTH EVERY DAY   icosapent Ethyl 1 g capsule Commonly known as: Vascepa Take 2 capsules (2 g total) by mouth 2 (two) times daily.   Lantus SoloStar 100 UNIT/ML Solostar Pen Generic drug: insulin glargine Inject 22 Units into the skin daily.   metFORMIN 500 MG 24  hr tablet Commonly known as: GLUCOPHAGE-XR Take 1 tablet (500 mg total) by mouth 2 (two) times daily.   nitroGLYCERIN 0.4 MG SL tablet Commonly known as: NITROSTAT Place 1 tablet (0.4 mg total) under the tongue every 5 (five) minutes as needed for chest pain.   Pen Needles 3/16" 31G X 5 MM Misc Lantus 23   potassium chloride SA 20 MEQ tablet Commonly known as: KLOR-CON Take 2 tablets (40 mEq total) by mouth daily. Please make yearly appt with Dr. Johnsie Cancel for January 2022 for future refills. 1st attempt   Repatha SureClick 956 MG/ML Soaj Generic drug: Evolocumab Inject 1 pen into the skin every 14 (fourteen) days.   spironolactone 25 MG tablet Commonly known as: ALDACTONE TAKE 1 TABLET BY MOUTH EVERY DAY   triamcinolone cream 0.1 % Commonly known as: KENALOG Apply 1 application topically as needed.         OBJECTIVE:   Vital Signs: BP 130/78   Pulse 87   Wt 260 lb 9.6 oz (118.2 kg)   SpO2 97%   BMI 40.82 kg/m   Wt Readings from Last 3 Encounters:  03/19/21 260 lb 9.6 oz (118.2 kg)  01/23/21 250 lb (113.4 kg)  12/28/20 258 lb 6.4 oz (117.2 kg)     Exam: General: Pt appears well and is in NAD  Lungs: Clear with good BS bilat with no rales, rhonchi, or wheezes  Heart: RRR   Abdomen: Normoactive bowel sounds, soft, nontender, without masses or organomegaly palpable  Extremities: No pretibial edema.   Neuro: MS is good with appropriate affect, pt is alert and Ox3     DATA REVIEWED:  Lab Results  Component Value Date   HGBA1C 7.5 (H) 03/19/2021   HGBA1C 6.8 (A) 09/23/2020   HGBA1C 11.5 (H) 06/03/2020   Lab Results  Component Value Date   MICROALBUR 42.4 (H) 03/19/2021   LDLCALC 47 02/01/2021   CREATININE 1.43 03/19/2021   Lab Results  Component Value Date   MICRALBCREAT 72.8 (H) 03/19/2021     Lab Results  Component Value Date   CHOL 121 02/01/2021   HDL 47 02/01/2021   LDLCALC 47 02/01/2021   LDLDIRECT 51 02/01/2021   TRIG 161 (H) 02/01/2021    CHOLHDL 2.6 02/01/2021       Results for  Peter Russo, Peter Russo (MRN [id number]) as of 03/19/2021 13:31  Ref. Range 03/19/2021 08:18  Sodium Latest Ref Range: 135 - 145 mEq/L 138  Potassium Latest Ref Range: 3.5 - 5.1 mEq/L 4.2  Chloride Latest Ref Range: 96 - 112 mEq/L 105  CO2 Latest Ref Range: 19 - 32 mEq/L 26  Glucose Latest Ref Range: 70 - 99 mg/dL 132 (H)  BUN Latest Ref Range: 6 - 23 mg/dL 18  Creatinine Latest Ref Range: 0.40 - 1.50 mg/dL 1.43  Calcium Latest Ref Range: 8.4 - 10.5 mg/dL 9.5  GFR Latest Ref Range: >60.00 mL/min 58.55 (L)  MICROALB/CREAT RATIO Latest Ref Range: 0.0 - 30.0 mg/g 72.8 (H)  Hemoglobin A1C Latest Ref Range: 4.6 - 6.5 % 7.5 (H)  Creatinine,U Latest Units: mg/dL 58.3  Microalb, Ur Latest Ref Range: 0.0 - 1.9 mg/dL 42.4 (H)    ASSESSMENT / PLAN / RECOMMENDATIONS:   1) Type 2 Diabetes Mellitus, Sub-Optimally controlled, With microalbuminuria - Most recent A1c of 7.5 %. Goal A1c < 7.0 %.     - A1c is trending up  - Unable to increase Metformin due to intolerance issues  - He was advised to stop drinking juice in the morning , discussed sugar free alternatives  - will increase insulin as below  - Will consider GLP-1 agonist if needed in the future   MEDICATIONS: Increase Lantus to 26 units daily  Continue Metformin 500 mg XR BID Continue Farxiga 10 mg daily    EDUCATION / INSTRUCTIONS: BG monitoring instructions: Patient is instructed to check his blood sugars 1 times a day, fasting. Call [facility] if: BG persistently < 70  I reviewed the Rule of 15 for the treatment of hypoglycemia in detail with the patient. Literature supplied.    2) Diabetic complications:  Eye: Does not have known diabetic retinopathy.  Neuro/ Feet: Does not have known diabetic peripheral neuropathy .  Renal: Patient does not have known baseline CKD. He   is on an ACEI/ARB at present.   3) Dyslipidemia:   -He had an elevated TG up to 671 mg/DL in  10/2020 -Recent lipid panel shows improvement 01/2021 -Managed by cardiology   Medication Atorvastatin 80 mg daily  Repatha 140 mg q. 14 days    4) Microalbuminuria:  -Slight elevation in the MA/CR ratio at 72.8 (03/2021), will encourage glycemic control -The patient is already on valsartan -We will continue to monitor  F/U in 4 months   Signed electronically by: Mack Guise, MD  [facility] Endocrin[facility]  [facility] [address] [place], [place] [address] Phone: [phone] FAX: [phone]   CC: Maximiano Coss, NP [address] [place] [address] Phone: [phone]  Fax: [phone]  Return to Endocrinology clinic as below: Future Appointments  Date Time Provider [place]  04/05/2021 10:00 AM [facility] ECHO OP 1 [facility]  04/05/2021 11:00 AM Larey Dresser, MD [facility] None  07/23/2021  7:30 AM Rameen Quinney, Melanie Crazier, MD [facility] None

## 2021-03-19 NOTE — Telephone Encounter (Signed)
PLease let the pt know that his diabetes is a bit worse, A1c is 7.5 %    PLease ask him to increase his insulin Lantus to 26 units daily  Continue Metformin two tablets a day  Continue Farxiga 1 tablet a day    Thanks   Abby Raelyn Mora, MD  [facility] Endocrinology  [facility] [address] [place], [place] [address] Phone: [phone] FAX: [phone]

## 2021-04-05 ENCOUNTER — Encounter (HOSPITAL_COMMUNITY): Payer: Self-pay | Admitting: Cardiology

## 2021-04-05 ENCOUNTER — Other Ambulatory Visit: Payer: Self-pay

## 2021-04-05 ENCOUNTER — Ambulatory Visit (HOSPITAL_COMMUNITY)
Admission: RE | Admit: 2021-04-05 | Discharge: 2021-04-05 | Disposition: A | Discharge status: Home / Self Care | Payer: Medicare Other | Source: Ambulatory Visit | Attending: Registered Nurse | Admitting: Registered Nurse

## 2021-04-05 ENCOUNTER — Ambulatory Visit (HOSPITAL_BASED_OUTPATIENT_CLINIC_OR_DEPARTMENT_OTHER)
Admission: RE | Admit: 2021-04-05 | Discharge: 2021-04-05 | Disposition: A | Discharge status: Home / Self Care | Payer: Medicare Other | Source: Ambulatory Visit | Attending: Cardiology | Admitting: Cardiology

## 2021-04-05 VITALS — BP 130/80 | HR 65 | Wt 266.2 lb

## 2021-04-05 DIAGNOSIS — I081 Rheumatic disorders of both mitral and tricuspid valves: Secondary | ICD-10-CM | POA: Insufficient documentation

## 2021-04-05 DIAGNOSIS — I110 Hypertensive heart disease with heart failure: Secondary | ICD-10-CM | POA: Diagnosis not present

## 2021-04-05 DIAGNOSIS — Z794 Long term (current) use of insulin: Secondary | ICD-10-CM | POA: Diagnosis not present

## 2021-04-05 DIAGNOSIS — Z6841 Body Mass Index (BMI) 40.0 and over, adult: Secondary | ICD-10-CM | POA: Insufficient documentation

## 2021-04-05 DIAGNOSIS — I5042 Chronic combined systolic (congestive) and diastolic (congestive) heart failure: Secondary | ICD-10-CM | POA: Diagnosis not present

## 2021-04-05 DIAGNOSIS — I255 Ischemic cardiomyopathy: Secondary | ICD-10-CM | POA: Insufficient documentation

## 2021-04-05 DIAGNOSIS — I272 Pulmonary hypertension, unspecified: Secondary | ICD-10-CM | POA: Diagnosis not present

## 2021-04-05 DIAGNOSIS — G4733 Obstructive sleep apnea (adult) (pediatric): Secondary | ICD-10-CM | POA: Diagnosis not present

## 2021-04-05 DIAGNOSIS — Z8249 Family history of ischemic heart disease and other diseases of the circulatory system: Secondary | ICD-10-CM | POA: Insufficient documentation

## 2021-04-05 DIAGNOSIS — I25119 Atherosclerotic heart disease of native coronary artery with unspecified angina pectoris: Secondary | ICD-10-CM | POA: Insufficient documentation

## 2021-04-05 DIAGNOSIS — Z09 Encounter for follow-up examination after completed treatment for conditions other than malignant neoplasm: Secondary | ICD-10-CM | POA: Insufficient documentation

## 2021-04-05 DIAGNOSIS — Z79899 Other long term (current) drug therapy: Secondary | ICD-10-CM | POA: Insufficient documentation

## 2021-04-05 DIAGNOSIS — Z7984 Long term (current) use of oral hypoglycemic drugs: Secondary | ICD-10-CM | POA: Diagnosis not present

## 2021-04-05 DIAGNOSIS — F17210 Nicotine dependence, cigarettes, uncomplicated: Secondary | ICD-10-CM | POA: Diagnosis not present

## 2021-04-05 DIAGNOSIS — Z7982 Long term (current) use of aspirin: Secondary | ICD-10-CM | POA: Insufficient documentation

## 2021-04-05 DIAGNOSIS — Z7901 Long term (current) use of anticoagulants: Secondary | ICD-10-CM | POA: Insufficient documentation

## 2021-04-05 DIAGNOSIS — E669 Obesity, unspecified: Secondary | ICD-10-CM | POA: Insufficient documentation

## 2021-04-05 DIAGNOSIS — E785 Hyperlipidemia, unspecified: Secondary | ICD-10-CM | POA: Insufficient documentation

## 2021-04-05 DIAGNOSIS — I5022 Chronic systolic (congestive) heart failure: Secondary | ICD-10-CM | POA: Diagnosis not present

## 2021-04-05 DIAGNOSIS — E119 Type 2 diabetes mellitus without complications: Secondary | ICD-10-CM | POA: Insufficient documentation

## 2021-04-05 LAB — DIGOXIN LEVEL: Digoxin Level: <0.2 ng/mL — ABNORMAL LOW (ref 0.8–2.0)

## 2021-04-05 LAB — BASIC METABOLIC PANEL
Anion gap: 8 (ref 5–15)
BUN: 15 mg/dL (ref 6–20)
CO2: 27 mmol/L (ref 22–32)
Calcium: 9.2 mg/dL (ref 8.9–10.3)
Chloride: 105 mmol/L (ref 98–111)
Creatinine, Ser: 1.28 mg/dL — ABNORMAL HIGH (ref 0.61–1.24)
GFR, Estimated: >60 mL/min (ref >60)
Glucose, Bld: 165 mg/dL — ABNORMAL HIGH (ref 70–99)
Potassium: 4.8 mmol/L (ref 3.5–5.1)
Sodium: 140 mmol/L (ref 135–145)

## 2021-04-05 LAB — ECHOCARDIOGRAM COMPLETE
Area-P 1/2: 6.9 cm2
Calc EF: 33.7 %
S' Lateral: 5.8 cm
Single Plane A2C EF: 34.8 %
Single Plane A4C EF: 33.9 %

## 2021-04-05 MED ORDER — CARVEDILOL 12.5 MG PO TABS: 12.5000 mg | ORAL_TABLET | Freq: Two times a day (BID) | ORAL | 3 refills | Status: DC

## 2021-04-05 MED ORDER — FUROSEMIDE 40 MG PO TABS: ORAL_TABLET | ORAL | 3 refills | Status: DC

## 2021-04-05 MED ORDER — PERFLUTREN LIPID MICROSPHERE
1.0000 mL | INTRAVENOUS | Status: DC | PRN
  Administered 2021-04-05: 2 mL via INTRAVENOUS
  Filled 2021-04-05: qty 10

## 2021-04-05 NOTE — Patient Instructions (Addendum)
EKG done today.  Labs done today. We will contact you only if your labs are abnormal.  INCREASE Carvedilol to 12.5mg  (1 tablet) by mouth 2 times daily.   INCREASE Lasix to 40mg  (1 tablet) by mouth every morning and 20mg  (1/2 tablet) by mouth every evening.  No other medication changes were made. Please continue all current medications as prescribed.  You have been referred to Pharmacy Clinic for weight loss. They will contact you to schedule an appointment.   Your physician has recommended that you have a cardiopulmonary stress test (CPX). CPX testing is a non-invasive measurement of heart and lung function. It replaces a traditional treadmill stress test. This type of test provides a tremendous amount of information that relates not only to your present condition but also for future outcomes. This test combines measurements of you ventilation, respiratory gas exchange in the lungs, electrocardiogram (EKG), blood pressure and physical response before, during, and following an exercise protocol. Please refer to the handout provided to you during today's visit.   Your physician recommends that you schedule a follow-up appointment in: 10 days for a lab only appointment and in 2 months with Dr. .  If you have any questions or concerns before your next appointment please send a message through [facility] or call our office at [phone].    TO LEAVE A MESSAGE FOR THE NURSE SELECT OPTION 2, PLEASE LEAVE A MESSAGE INCLUDING: YOUR NAME DATE OF BIRTH CALL BACK NUMBER REASON FOR CALL**this is important as we prioritize the call backs  YOU WILL RECEIVE A CALL BACK THE SAME DAY AS LONG AS YOU CALL BEFORE 4:00 PM   Do the following things EVERYDAY: Weigh yourself in the morning before breakfast. Write it down and keep it in a log. Take your medicines as prescribed Eat low salt foods--Limit salt (sodium) to 2000 mg per day.  Stay as active as you can everyday Limit all fluids for the day to  less than 2 liters   At the [facility], you and your health needs are our priority. As part of our continuing mission to provide you with exceptional heart care, we have created designated Provider Care Teams. These Care Teams include your primary Cardiologist (physician) and Advanced Practice Providers (APPs- Physician Assistants and Nurse Practitioners) who all work together to provide you with the care you need, when you need it.   You may see any of the following providers on your designated Care Team at your next follow up: Dr [address] [phone], NP Arvilla Meres, Carron Curie Robbie Lis, PharmD   Please be sure to bring in all your medications bottles to every appointment.

## 2021-04-05 NOTE — Progress Notes (Signed)
PCP: Maximiano Coss, NP Cardiology: Dr. Johnsie Cancel HF Cardiology: Dr. Aundra Dubin  45 y.o. with history of CAD s/p multiple MIs and PCIs, ischemic cardiomyopathy, HTN, active smoking, and diabetes.  Patient has had multiple coronary interventions, fist in 2010.  His RCA is now chronically occluded, and he has had many stents in the LAD.  Echo in 1/18 showed EF 40%.  EF was down to 20-25% in 12/19.  Echo in 7/21 was reported as EF 25-30%, moderate LV enlargement, normal RV.  Of note, patient seen in urgent care in 7/21 and noted to have blood glucose > 400.  HgbA1c was 11.3. He was started on metformin at urgent care with new diagnosis of diabetes.     Given more frequent chest pain and dyspnea with moderate exertion, I took him for LHC/RHC in 10/21.  This showed low output HF with CI 1.48.  His LAD was totally occluded (long segment) with no collaterals, the RCA was totally occluded with R-R collaterals, and the LCx was patent.  He was admitted and started on milrinone, diuresed.  He was weaned off milrinone.  Lifevest was recommended but he refused.  He was seen by cardiac surgery, not a candidate for CABG.  We began LVAD evaluation (active smoker, not transplant candidate).  Echo in 10/21 showed EF 20-25% with severe LV dilation.  CPX (10/21) showed peak VO2 16, VE/VCO2 slope 37, RER 1.13 => moderate HF limitation.   Echo was done today and reviewed, EF 20-25%, moderate LV dilation, mildly decreased RV systolic function.   Patient returns for followup of CHF and CAD.  Weight is up about 8 lbs.  He reports dietary indiscretion over the summer.  No dyspnea walking on flat ground, mild dyspnea walking up stairs.  No orthopnea/PND.  He has stayed off cigarettes.  Still does not have CPAP yet.  No chest pain.   Labs (7/21): K 5, creatinine 1.11, glucose 464, hgbA1 11.3 Labs (9/21): LDL 175 Labs (10/21): K 3.9, creatinine 1.0, hgbA1c 12.2 => 11.5 Labs (11/21): K 3.7, creatinine 1.05 Labs (12/21): K 4, creatinine  1.22 Labs (1/22): digoxin 0.2, hgbA1c 6.5, K 4.1, creatinine 1.16 Labs (3/22): LDL 16, TGs 671 Labs (6/22): LDL 51, TGs 161, HDL 47 Labs (8/22): K 4.2, creatinine 1.4  ECG (personally reviewed): NSR, LVH with repolarization abnormality  PMH: 1. CAD:  - BMS to LAD in 2010.  - DES to RCA on 2012 - DES to LCx and LAD in 8/18, complicated by dissection with overlapping DES to mid LAD.  - Anterior STEMI in 1/17 with DES to totally occluded LAD (stent thrombosis).  - 8/17 PCI to apical LAD, RCA noted to be totally occluded with collaterals.  - 3/18 anterior STEMI with PTCA to totally occluded LAD.  - Coronary angiography (10/21): LAD was totally occluded (long segment) with no collaterals, the RCA was totally occluded with R-R collaterals, and the LCx was patent 2. Chronic systolic CHF: Ischemic cardiomyopathy.   - Echo (1/18): EF 40%.  - Echo (12/19): EF 20-25% - Echo (7/21): EF 25-30%, moderate LV enlargement, mild LVH, RV normal.  - RHC (10/21): mean RA 6, PA 47/13, mean PCWP 20, CI 1.48, PVR 2.4 WU - CPX (10/21): peak VO2 16, VE/VCO2 slope 37, RER 1.13 => moderate HF limitation - Echo (8/22): EF 20-25%, moderate LV dilation, mildly decreased RV systolic function 3. HTN 4. H/o AVNRT 5. Smoking: Active smoker, 1-2 cigs/day.  - PFTs in 10/21 with minimal obstructive disease.  6. OSA: Severe  7. Type 2 diabetes: Diagnosed in 7/21 8. Carotid dopplers (10/21): 1-39% BICA stenosis.  9. PAD: ABIs (10/21) were mildly decreased.  10. Hyperlipidemia  Social History   Socioeconomic History   Marital status: Significant Other    Spouse name: Not on file   Number of children: 3   Years of education: Not on file   Highest education level: Not on file  Occupational History   Occupation: Aeronautical engineer: [employer]  Tobacco Use   Smoking status: Former    Packs/day: 1.00    Years: 24.00    Pack years: 24.00    Types: Cigarettes    Quit date: 11/03/2016    Years since  quitting: 4.4   Smokeless tobacco: Never   Tobacco comments:    quit on 03/15/16  Vaping Use   Vaping Use: Never used  Substance and Sexual Activity   Alcohol use: No   Drug use: Yes    Types: Marijuana    Comment: last time 02/2016   Sexual activity: Yes  Other Topics Concern   Not on file  Social History Narrative   Lives in [place] by himself.  Sometimes stays with girlfriend in [place].  Does not routinely exercise.   Social Determinants of Health   Financial Resource Strain: Not on file  Food Insecurity: Not on file  Transportation Needs: Not on file  Physical Activity: Not on file  Stress: Not on file  Social Connections: Not on file  Intimate Partner Violence: Not on file   Family History  Problem Relation Age of Onset   Arrhythmia Mother        MOTHER LIVED TO BE 89   Coronary artery disease Mother    Heart attack Mother    Hypertension Mother    Coronary artery disease Father 7   Heart disease Father    Heart attack Father    Heart attack Brother    Stroke Neg Hx    ROS: All systems reviewed and negative except as per HPI.   Current Outpatient Medications  Medication Sig Dispense Refill   acetaminophen (TYLENOL) 325 MG tablet Take 2 tablets (650 mg total) by mouth every 4 (four) hours as needed for headache or mild pain.     aspirin 81 MG EC tablet Take 1 tablet (81 mg total) by mouth daily. 90 tablet 3   atorvastatin (LIPITOR) 80 MG tablet TAKE 1 TABLET BY MOUTH EVERY DAY AT 6 PM 90 tablet 3   blood glucose meter kit and supplies Dispense based on patient and insurance preference. Use up to four times daily as directed. (FOR ICD-10 E10.9, E11.9). 1 each 0   buPROPion (WELLBUTRIN XL) 150 MG 24 hr tablet TAKE 1 TABLET BY MOUTH EVERY DAY 90 tablet 3   clotrimazole-betamethasone (LOTRISONE) cream Apply 1 application topically as needed.     dapagliflozin propanediol (FARXIGA) 10 MG TABS tablet Take 1 tablet (10 mg total) by mouth daily before breakfast. 90 tablet  3   digoxin (LANOXIN) 0.125 MG tablet TAKE 1 TABLET (0.125 MG TOTAL) BY MOUTH DAILY. PLEASE MAKE OVERDUE APPT BEFORE REFILLING 90 tablet 2   Evolocumab (REPATHA SURECLICK) 892 MG/ML SOAJ Inject 1 pen into the skin every 14 (fourteen) days. 2 mL 11   fenofibrate 160 MG tablet Take 1 tablet (160 mg total) by mouth daily. 90 tablet 3   icosapent Ethyl (VASCEPA) 1 g capsule Take 2 capsules (2 g total) by mouth 2 (two) times daily. 120 capsule 11  insulin glargine (LANTUS SOLOSTAR) 100 UNIT/ML Solostar Pen Inject 26 Units into the skin daily. 30 mL 4   Insulin Pen Needle (PEN NEEDLES 3/16") 31G X 5 MM MISC Lantus 23 100 each 1   isosorbide-hydrALAZINE (BIDIL) 20-37.5 MG tablet Take 1 tablet by mouth 3 (three) times daily. 90 tablet 11   metFORMIN (GLUCOPHAGE-XR) 500 MG 24 hr tablet Take 1 tablet (500 mg total) by mouth 2 (two) times daily. 180 tablet 3   nitroGLYCERIN (NITROSTAT) 0.4 MG SL tablet Place 1 tablet (0.4 mg total) under the tongue every 5 (five) minutes as needed for chest pain. 25 tablet 3   potassium chloride SA (KLOR-CON) 20 MEQ tablet Take 2 tablets (40 mEq total) by mouth daily. Please make yearly appt with Dr. Johnsie Cancel for January 2022 for future refills. 1st attempt 180 tablet 0   sacubitril-valsartan (ENTRESTO) 97-103 MG Take 1 tablet by mouth 2 (two) times daily. 60 tablet 11   spironolactone (ALDACTONE) 25 MG tablet TAKE 1 TABLET BY MOUTH EVERY DAY 90 tablet 3   triamcinolone cream (KENALOG) 0.1 % Apply 1 application topically as needed.     carvedilol (COREG) 12.5 MG tablet Take 1 tablet (12.5 mg total) by mouth 2 (two) times daily. 180 tablet 3   furosemide (LASIX) 40 MG tablet Take 1 tablet (40 mg total) by mouth every morning AND 0.5 tablets (20 mg total) every evening. 135 tablet 3   No current facility-administered medications for this encounter.   BP 130/80   Pulse 65   Wt 120.7 kg (266 lb 3.2 oz)   SpO2 99%   BMI 41.69 kg/m  General: NAD Neck: JVP 8-9 cm, no  thyromegaly or thyroid nodule.  Lungs: Clear to auscultation bilaterally with normal respiratory effort. CV: Nondisplaced PMI.  Heart regular S1/S2, no S3/S4, no murmur.  No peripheral edema.  No carotid bruit.  Normal pedal pulses.  Abdomen: Soft, nontender, no hepatosplenomegaly, no distention.  Skin: Intact without lesions or rashes.  Neurologic: Alert and oriented x 3.  Psych: Normal affect. Extremities: No clubbing or cyanosis.  HEENT: Normal.   Assessment/Plan: 1. CAD: RCA known to be chronically occluded.  He has had multiple PCIs to LAD.  Repeat LHC in 10/21 done for recurrent angina demonstrated occluded RCA (known from past) and occluded pLAD with no collaterals. His LCx was patent. I do not think we have a revascularization option, multiple overlapping LAD stents, now occluded and there are no good surgical targets. No recent chest pain.   - Continue ASA 81  - Continue Atorvastatin 80  2. Chronic systolic CHF: Ischemic cardiomyopathy.  Echo in 10/21 with EF 20-25%, severe LV dilation, mildly decreased RV systolic function.  RHC in 10/21 demonstrated elevated PCWP, mild pulmonary venous hypertension and low cardiac output with CI 1.4. Patient was transiently on milrinone but weaned off. CPX in 10/21 showed peak VO2 16, VE/VCO2 slope 37, RER 1.13 => moderate HF limitation.  Echo today showed EF 20-25%.  Symptomatically, he has been NYHA class II recently.  He is mildly voluem overloaded on exam with weight up.   - Increase Lasix to 40 qam/20 qpm, BMET today and in 10 days.    - Continue Entresto 97/103 bid.  - Increase Coreg to 12.5 mg bid.  - Continue spironolactone 25 daily  - Continue digoxin 0.125 daily, check level.   - Continue Farxiga 10 mg daily.    - Continue Bidil 1 tab tid.  - Arrange for repeat CPX.  -  He has had LVAD workup in the past.  He had low CO on RHC though symptomatically does not seem that severe, CPX matches symptoms more with moderate HF limitation.  With  symptomatic improvement, we have held off on advanced therapies for now.  Transplant would be a better option than LVAD if something is needed given his age, but he will need to get his BMI down further and keep glucose under control (also improved recently).   He has quit smoking for > 6 months.  - He is not a CRT candidate. We recommended Lifevest but he refused.  He has seen Dr. Caryl Comes regarding ICD; I recommended again that he go ahead and have ICD placed, but he is nervous about this and still wants to think more about it. 3. Type 2 diabetes: New diagnosis in 7/21 with hgbA1c 11.3.  Most recently, HgbA1c 7.5.  - Continue Farxiga 10 mg daily.  4. OSA: Severe OSA, waiting for CPAP.  5. Smoking:  He quit in 10/21.  PFTs with minimal obstructive disease.  - Continue Wellbutrin 6. Hyperlipidemia: LDL is at goal on Repatha and atorvastatin.  TGs high when last checked, now on Vascepa and fenofibrate.  Followed by lipid clinic. Good lipids in 6/22.  7. Obesity: Need to get weight down.  This needs better management before heart transplant could be considered.  - I will refer to pharmacy clinic to start semaglutide for weight loss.  - Continue efforts at diet and exercise.   Followup with me in 2 months.    Loralie Champagne 04/05/2021

## 2021-04-05 NOTE — Progress Notes (Signed)
  Echocardiogram 2D Echocardiogram has been performed.  Peter Russo 04/05/2021, 10:42 AM

## 2021-04-14 ENCOUNTER — Other Ambulatory Visit (HOSPITAL_COMMUNITY): Payer: Medicare Other

## 2021-04-20 ENCOUNTER — Encounter (HOSPITAL_COMMUNITY): Payer: Medicare Other

## 2021-04-30 ENCOUNTER — Ambulatory Visit: Payer: Medicare Other

## 2021-05-07 ENCOUNTER — Other Ambulatory Visit: Payer: Self-pay

## 2021-05-07 ENCOUNTER — Ambulatory Visit (INDEPENDENT_AMBULATORY_CARE_PROVIDER_SITE_OTHER): Payer: Medicare Other | Admitting: Pharmacist

## 2021-05-07 DIAGNOSIS — E1165 Type 2 diabetes mellitus with hyperglycemia: Secondary | ICD-10-CM | POA: Diagnosis not present

## 2021-05-07 MED ORDER — OZEMPIC (0.25 OR 0.5 MG/DOSE) 2 MG/1.5ML ~~LOC~~ SOPN: PEN_INJECTOR | SUBCUTANEOUS | 1 refills | Status: DC

## 2021-05-07 NOTE — Patient Instructions (Addendum)
Counseling Points: This medication reduces your appetite and may make you feel fuller longer.  Stop eating when your body tells you that you are full. This will likely happen sooner than you are used to. Store your medication in the fridge until you are ready to use it. Inject your medication in the fatty tissue of your lower abdominal area (2 inches away from belly button) or upper outer thigh. Rotate injection sites. Use a different needle with each injection. Common side effects include: nausea, diarrhea/constipation, and heartburn, and are more likely to occur if you overeat.  Ozempic dosing schedule: - Month 1: Inject 0.25mg  once weekly - Month 2: Inject 0.5mg  once weekly - Month 3: Inject 1mg  once weekly - Month 4 and on: Inject 2mg  once weekly  Victoza dosing schedule: -Week 1: Inject 0.6mg  once daily - Week 2: Inject 1.2mg  once daily - Week 3 and on: Inject 1.8mg  once daily  Tips for living a healthier life  SUGAR  Sugar is a huge problem in the modern day diet. Sugar is a big contributor to heart disease, diabetes, high triglyceride levels, fatty liver disease and obesity. Sugar is hidden in almost all packaged foods/beverages. Added sugar is extra sugar that is added beyond what is naturally found and has no nutritional benefit for your body. The American Heart Association recommends limiting added sugars to no more than 25g for women and 36 grams for men per day. There are many names for sugar including maltose, sucrose (names ending in "ose"), high fructose corn syrup, molasses, cane sugar, corn sweetener, raw sugar, syrup, honey or fruit juice concentrate.   One of the best ways to limit your added sugars is to stop drinking sweetened beverages such as soda, sweet tea, and fruit juice. There is 65g of added sugars in one 20oz bottle of Coke! That is equal to 6 donuts.   Pay attention and read all nutrition facts labels. Below is an examples of a nutrition facts label. The #1  is showing you the total sugars where the # 2 is showing you the added sugars. This one serving has almost the max amount of added sugars per day!     EXERCISE  Exercise is good. We've all heard that. In an ideal world, we would all have time and resources to get plenty of it. When you are active, your heart pumps more efficiently and you will feel better.  Multiple studies show that even walking regularly has benefits that include living a longer life. The American Heart Association recommends 150 minutes per week of exercise (30 minutes per day most days of the week). You can do this in any increment you wish. Nine or more 10-minute walks count. So does an hour-long exercise class. Break the time apart into what will work in your life. Some of the best things you can do include walking briskly, jogging, cycling or swimming laps. Not everyone is ready to "exercise." Sometimes we need to start with just getting active. Here are some easy ways to be more active throughout the day:  Take the stairs instead of the elevator  Go for a 10-15 minute walk during your lunch break (find a friend to make it more enjoyable)  When shopping, park at the back of the parking lot  If you take public transportation, get off one stop early and walk the extra distance  Pace around while making phone calls  Check with your doctor if you aren't sure what your limitations may be.  Always remember to drink plenty of water when doing any type of exercise. Don't feel like a failure if you're not getting the 90-150 minutes per week. If you started by being a couch potato, then just a 10-minute walk each day is a huge improvement. Start with little victories and work your way up.   HEALTHY EATING TIPS  When looking to improve your eating habits, whether to lose weight, lower blood pressure or just be healthier, it helps to know what a serving size is.   Grains 1 slice of bread,  bagel,  cup pasta or rice  Vegetables 1  cup fresh or raw vegetables,  cup cooked or canned Fruits 1 piece of medium sized fruit,  cup canned,   Meats/Proteins  cup dried       1 oz meat, 1 egg,  cup cooked beans, nuts or seeds  Dairy        Fats Individual yogurt container, 1 cup (8oz)    1 teaspoon margarine/butter or vegetable  milk or milk alternative, 1 slice of cheese          oil; 1 tablespoon mayonnaise or salad dressing                  Plan ahead: make a menu of the meals for a week then create a grocery list to go with that menu. Consider meals that easily stretch into a night of leftovers, such as stews or casseroles. Or consider making two of your favorite meal and put one in the freezer for another night. Try a night or two each week that is "meatless" or "no cook" such as salads. When you get home from the grocery store wash and prepare your vegetables and fruits. Then when you need them they are ready to go.   Tips for going to the grocery store:  Buy store or generic brands  Check the weekly ad from your store on-line or in their in-store flyer  Look at the unit price on the shelf tag to compare/contrast the costs of different items  Buy fruits/vegetables in season  Carrots, bananas and apples are low-cost, naturally healthy items  If meats or frozen vegetables are on sale, buy some extras and put in your freezer  Limit buying prepared or "ready to eat" items, even if they are pre-made salads or fruit snacks  Do not shop when you're hungry  Foods at eye level tend to be more expensive. Look on the high and low shelves for deals.  Consider shopping at the farmer's market for fresh foods in season.  Avoid the cookie and chip aisles (these are expensive, high in calories and low in nutritional value). Shop on the outside of the grocery store.  Healthy food preparations:  If you can't get lean hamburger, be sure to drain the fat when cooking  Steam, saut (in olive oil), grill or bake foods  Experiment with  different seasonings to avoid adding salt to your foods. Kosher salt, sea salt and Himalayan salt are all still salt and should be avoided. Try seasoning food with onion, garlic, thyme, rosemary, basil ect. Onion powder or garlic powder is ok. Avoid if it says salt (ie garlic salt).        Other resources: American Heart Association - MartiniMobile.it         Go to the Healthy Living tab to get more information American Diabetes Association - www.diabetes.org         You don't have  to be diabetic - check out the Food and Fitness tab

## 2021-05-07 NOTE — Progress Notes (Signed)
Patient ID: Peter Russo                 DOB: 1975/10/21                    MRN: [id number]     HPI: Peter Russo is a 45 y.o. male patient referred to pharmacy clinic by Dr Aundra Dubin to initiate weight loss therapy with GLP1-RA. PMH is significant for CAD s/p multiple MIs and PCIs, ischemic cardiomyopathy with most recent LVEF 20-25% on 04/05/21 followed in advanced HF clinic, HTN, DM, OSA, former tobacco abuse, and obesity. Has previously been seen in the lipid clinic, labs well controlled on atorvastatin 43m daily, Repatha 1441mQ2W, fenofibrate 16011maily, and Vascepa 2g BID. Most recent BMI 42.  Pt has T2DM for which he already takes metformin 500m68mD, Lantus 26u daily, and Farxiga 10mg21mly (also for CHF). A1c > 11 at time of DM diagnosis in 2021, peaked at 12.2. Most recently 7.5% on 03/19/21.  Pt reports A1c was checked at home health visit today and was 8%. DM followed by Dr ShamlKelton [facility] endocrinology.  Current weight management medications: none  Previously tried meds: none  Current meds that may affect weight: insulin  Baseline weight/BMI: 266 lbs / 42  Insurance payor: Medicaid  Diet: Cooks at home for his family -Breakfast: bowl of cereal or eggs/ham -Lunch: varies, might be home or out, salad or burger -Dinner: chicken, sometimes air fried, lamb, stew, salmon patties -Snacks: doesn't eat many sweets -Drinks: used to drink a lot of soda (2 per meal), now drinks diet and water. Does drink OJ in the morning with his pills  Exercise: Not much, wants to start walking  Family History: Mother with CAD, MI, and HTN. Father with CAD and MI. Brother with MI.  Social History: Former smoker.   Labs: Lab Results  Component Value Date   HGBA1C 7.5 (H) 03/19/2021    Wt Readings from Last 1 Encounters:  04/05/21 266 lb 3.2 oz (120.7 kg)    BP Readings from Last 1 Encounters:  04/05/21 130/80   Pulse Readings from Last 1 Encounters:  04/05/21 65        Component Value Date/Time   CHOL 121 02/01/2021 1224   TRIG 161 (H) 02/01/2021 1224   HDL 47 02/01/2021 1224   CHOLHDL 2.6 02/01/2021 1224   CHOLHDL 7.3 05/11/2020 1246   VLDL 72 (H) 05/11/2020 1246   LDLCALotsee6/20/2022 1224   LDLDIRECT 51 02/01/2021 1224    Past Medical History:  Diagnosis Date   Anxiety    CAD S/P percutaneous coronary angioplasty    a. s/p BMS-mLAD 2010. b. DES to RCA 2012. c. 04/2015: DES to LCx and PCI to LAD c/b dissection with subsequent overlapping DES to mLAD - r/i for NSTEMI afterwards; d. 08/2015 Ant STEMI in setting of noncompliance->100mLA61mR (3.0x16 Synergy DES); e. 03/2016 PCI of apical LAD; f. 03/2016 Relook Cath: patent LAD stents, RCA 100p CTO-->Med Rx; g. 10/2016 Ant STEMI: PTCA 100p LAD.   Chronic combined systolic and diastolic CHF (congestive heart failure) (HCC)  [place]. 03/2016 Echo: EF 30-35%, Gr2 DD;  b. 08/2016 Echo: EF 40%.   Depression    Essential hypertension    GAD (generalized anxiety disorder)    GERD (gastroesophageal reflux disease)    Hypercholesteremia    Ischemic cardiomyopathy    a. prior EF 25-35 percent at cath, 35-40 percent by echo; b. 03/2016 Echo: EF 30-35%,  diff HK, Gr2 DD; c. 08/2016 Echo: EF 40%, base/mid inferior/inferowseptal AK, mildly dil LA.   Morbid obesity ([place])    Pre-diabetes    SVT (supraventricular tachycardia) ([place]) APRROX 10 YRS AGO   PRESUMED AVNRT, ECHO 12/07 WITH EF 65%, MILD LAE   Tobacco abuse    a. 20 + pack years, quit 10/2016.    Current Outpatient Medications on File Prior to Visit  Medication Sig Dispense Refill   acetaminophen (TYLENOL) 325 MG tablet Take 2 tablets (650 mg total) by mouth every 4 (four) hours as needed for headache or mild pain.     aspirin 81 MG EC tablet Take 1 tablet (81 mg total) by mouth daily. 90 tablet 3   atorvastatin (LIPITOR) 80 MG tablet TAKE 1 TABLET BY MOUTH EVERY DAY AT 6 PM 90 tablet 3   blood glucose meter kit and supplies Dispense based on patient and insurance  preference. Use up to four times daily as directed. (FOR ICD-10 E10.9, E11.9). 1 each 0   buPROPion (WELLBUTRIN XL) 150 MG 24 hr tablet TAKE 1 TABLET BY MOUTH EVERY DAY 90 tablet 3   carvedilol (COREG) 12.5 MG tablet Take 1 tablet (12.5 mg total) by mouth 2 (two) times daily. 180 tablet 3   clotrimazole-betamethasone (LOTRISONE) cream Apply 1 application topically as needed.     dapagliflozin propanediol (FARXIGA) 10 MG TABS tablet Take 1 tablet (10 mg total) by mouth daily before breakfast. 90 tablet 3   digoxin (LANOXIN) 0.125 MG tablet TAKE 1 TABLET (0.125 MG TOTAL) BY MOUTH DAILY. PLEASE MAKE OVERDUE APPT BEFORE REFILLING 90 tablet 2   Evolocumab (REPATHA SURECLICK) 299 MG/ML SOAJ Inject 1 pen into the skin every 14 (fourteen) days. 2 mL 11   fenofibrate 160 MG tablet Take 1 tablet (160 mg total) by mouth daily. 90 tablet 3   furosemide (LASIX) 40 MG tablet Take 1 tablet (40 mg total) by mouth every morning AND 0.5 tablets (20 mg total) every evening. 135 tablet 3   icosapent Ethyl (VASCEPA) 1 g capsule Take 2 capsules (2 g total) by mouth 2 (two) times daily. 120 capsule 11   insulin glargine (LANTUS SOLOSTAR) 100 UNIT/ML Solostar Pen Inject 26 Units into the skin daily. 30 mL 4   Insulin Pen Needle (PEN NEEDLES 3/16") 31G X 5 MM MISC Lantus 23 100 each 1   isosorbide-hydrALAZINE (BIDIL) 20-37.5 MG tablet Take 1 tablet by mouth 3 (three) times daily. 90 tablet 11   metFORMIN (GLUCOPHAGE-XR) 500 MG 24 hr tablet Take 1 tablet (500 mg total) by mouth 2 (two) times daily. 180 tablet 3   nitroGLYCERIN (NITROSTAT) 0.4 MG SL tablet Place 1 tablet (0.4 mg total) under the tongue every 5 (five) minutes as needed for chest pain. 25 tablet 3   potassium chloride SA (KLOR-CON) 20 MEQ tablet Take 2 tablets (40 mEq total) by mouth daily. Please make yearly appt with Dr. Johnsie Cancel for January 2022 for future refills. 1st attempt 180 tablet 0   sacubitril-valsartan (ENTRESTO) 97-103 MG Take 1 tablet by mouth 2  (two) times daily. 60 tablet 11   spironolactone (ALDACTONE) 25 MG tablet TAKE 1 TABLET BY MOUTH EVERY DAY 90 tablet 3   triamcinolone cream (KENALOG) 0.1 % Apply 1 application topically as needed.     No current facility-administered medications on file prior to visit.    No Known Allergies   Assessment/Plan:  1. Weight loss - Patient has not met goal of at least 5% of body  weight loss with comprehensive lifestyle modifications alone in the past 3-6 months. Pharmacotherapy is appropriate to pursue as augmentation. Will start Ozempic 0.16m once weekly x4 weeks followed by monthly dose titration up to 239mas tolerated. He has Medicaid so weight loss branded formulation Wegovy is not covered. Confirmed patient has no personal or family history of medullary thyroid carcinoma (MTC) or Multiple Endocrine Neoplasia syndrome type 2 (MEN 2).   Pt advised to continue monitoring glucose at home. If he experiences lows, may need to decrease dose of his insulin which his endocrinologist has been prescribing.  He was encouraged to start walking for 30 minutes most days a week, and to cut back on juice in the AM.  Advised patient on common side effects including nausea, diarrhea, dyspepsia, decreased appetite, and fatigue. Counseled patient on reducing meal size and how to titrate medication to minimize side effects. Counseled patient to call if intolerable side effects or if experiencing dehydration, abdominal pain, or dizziness. Patient will adhere to dietary modifications and will target at least 150 minutes of moderate intensity exercise weekly.   Follow up by phone in 1 month.  Daman Steffenhagen E. Kingslee Mairena, PharmD, BCACP, [id number]. [id number] Frem[address] StreetGrHonokaaNC [address]hone: [phone]: ([phone]/23/2022 2:27 PM

## 2021-06-07 ENCOUNTER — Telehealth: Payer: Self-pay | Admitting: Pharmacist

## 2021-06-07 NOTE — Telephone Encounter (Signed)
Called pt to follow up with Ozempic. Reports tolerating well, feeling less hungry. He's aware to increase dose to 0.5mg  once weekly (has 2 doses left in his initial pen, then will refill for additional month of 0.5mg ). Will call pt in 5-6 weeks for further dose titration up to 1mg  weekly.

## 2021-06-08 ENCOUNTER — Encounter (HOSPITAL_COMMUNITY): Payer: Medicare Other | Admitting: Cardiology

## 2021-06-17 ENCOUNTER — Inpatient Hospital Stay (HOSPITAL_COMMUNITY): Admission: RE | Admit: 2021-06-17 | Payer: Medicare Other | Source: Ambulatory Visit | Admitting: Cardiology

## 2021-07-13 ENCOUNTER — Telehealth: Payer: Self-pay | Admitting: Pharmacist

## 2021-07-13 MED ORDER — OZEMPIC (1 MG/DOSE) 4 MG/3ML ~~LOC~~ SOPN: 1.0000 mg | PEN_INJECTOR | SUBCUTANEOUS | 5 refills | Status: DC

## 2021-07-13 NOTE — Telephone Encounter (Signed)
Called pt to follow up with Ozempic tolerability and titration. Reports his appetite is down, tolerating med well. Needs more batteries for scale so hasn't been able to keep an eye on his weight lately. Will send in refill for increased dose of 1mg  weekly for continued titration. Will call pt in a few months to follow up.

## 2021-07-23 ENCOUNTER — Other Ambulatory Visit: Payer: Self-pay

## 2021-07-23 ENCOUNTER — Encounter: Payer: Self-pay | Admitting: Internal Medicine

## 2021-07-23 ENCOUNTER — Ambulatory Visit (INDEPENDENT_AMBULATORY_CARE_PROVIDER_SITE_OTHER): Payer: Medicare Other | Admitting: Internal Medicine

## 2021-07-23 VITALS — BP 120/80 | HR 70 | Wt 259.4 lb

## 2021-07-23 DIAGNOSIS — E1129 Type 2 diabetes mellitus with other diabetic kidney complication: Secondary | ICD-10-CM

## 2021-07-23 DIAGNOSIS — Z794 Long term (current) use of insulin: Secondary | ICD-10-CM | POA: Insufficient documentation

## 2021-07-23 DIAGNOSIS — E1159 Type 2 diabetes mellitus with other circulatory complications: Secondary | ICD-10-CM

## 2021-07-23 DIAGNOSIS — R809 Proteinuria, unspecified: Secondary | ICD-10-CM

## 2021-07-23 LAB — POCT GLYCOSYLATED HEMOGLOBIN (HGB A1C): Hemoglobin A1C: 6.4 % — AB (ref 4.0–5.6)

## 2021-07-23 MED ORDER — DAPAGLIFLOZIN PROPANEDIOL 10 MG PO TABS: 10.0000 mg | ORAL_TABLET | Freq: Every day | ORAL | 3 refills | Status: DC

## 2021-07-23 MED ORDER — METFORMIN HCL ER 500 MG PO TB24: 500.0000 mg | ORAL_TABLET | Freq: Two times a day (BID) | ORAL | 3 refills | Status: DC

## 2021-07-23 MED ORDER — LANTUS SOLOSTAR 100 UNIT/ML ~~LOC~~ SOPN: 26.0000 [IU] | PEN_INJECTOR | Freq: Every day | SUBCUTANEOUS | 4 refills | Status: DC

## 2021-07-23 NOTE — Patient Instructions (Addendum)
-   Keep Up the Good Work ! - Continue  Lantus 26 units daily  - Continue Metformin 500 mg XR , 1 tablet twice daily  - Continue Farxiga 10 mg, 1 tablet with Breakfast  - Continue Ozempic 1 mg weekly     HOW TO TREAT LOW BLOOD SUGARS (Blood sugar LESS THAN 70 MG/DL) Please follow the RULE OF 15 for the treatment of hypoglycemia treatment (when your (blood sugars are less than 70 mg/dL)   STEP 1: Take 15 grams of carbohydrates when your blood sugar is low, which includes:  3-4 GLUCOSE TABS  OR 3-4 OZ OF JUICE OR REGULAR SODA OR ONE TUBE OF GLUCOSE GEL    STEP 2: RECHECK blood sugar in 15 MINUTES STEP 3: If your blood sugar is still low at the 15 minute recheck --> then, go back to STEP 1 and treat AGAIN with another 15 grams of carbohydrates.

## 2021-07-23 NOTE — Progress Notes (Signed)
Name: Peter Russo  Age/ Sex: 45 y.o., male   MRN/ DOB: [id number], 06/24/1976     PCP: Maximiano Coss, NP   Reason for Endocrinology Evaluation: Type 2 Diabetes Mellitus  Initial Endocrine Consultative Visit: 09/23/2020    PATIENT IDENTIFIER: Peter Russo is a 45 y.o. male with a past medical history of T2Dm, CAD ( S/P PCI), CHF, and dyslipidemia The patient has followed with Endocrinology clinic since 09/23/2020 for consultative assistance with management of his diabetes.  DIABETIC HISTORY:  [name] was diagnosed with DM 02/2020. His hemoglobin A1c has ranged from 11.3% in7/2021, peaking at 12.2% in 2021.  ON his initial visit to our clinic he had an A1c of 6.8% , we switched Metformin to XR , continued Lantus and Wilder Glade    He was started on Ozempic 03/2021 by cardiology for weight loss in preparation  for heart transplant   SUBJECTIVE:   During the last visit (03/19/2021): A1c 7.5% Increased  Lantus and continued  [place] and metformin    Today (07/23/2021): [name] is here for diabetes management.  He checks his blood sugars every other day  . The patient has not had hypoglycemic episodes since the last clinic visit.   He was started on Ozempic by cardiology for weight loss in preparation for transplant  He likes to drinks orange juice    HOME DIABETES REGIMEN:  Lantus 26 units daily Metformin 500 mg XR , 1 tablet twice daily Farxiga 10 mg, 1 tablet with Breakfast  Ozempic 1 mg weekly    Statin: yes ACE-I/ARB: yes    METER DOWNLOAD SUMMARY: Did not bring     DIABETIC COMPLICATIONS: Microvascular complications:   Denies: CKD, retinopathy, neuropathy  Last Eye Exam: Completed 09/2020  Macrovascular complications:  CAD ( S/P PCI) , CHF  Denies: CVA, PVD   HISTORY:  Past Medical History:  Past Medical History:  Diagnosis Date   Anxiety    CAD S/P percutaneous coronary angioplasty    a. s/p BMS-mLAD 2010. b. DES to RCA 2012. c.  04/2015: DES to LCx and PCI to LAD c/b dissection with subsequent overlapping DES to mLAD - r/i for NSTEMI afterwards; d. 08/2015 Ant STEMI in setting of noncompliance->128mAD ISR (3.0x16 Synergy DES); e. 03/2016 PCI of apical LAD; f. 03/2016 Relook Cath: patent LAD stents, RCA 100p CTO-->Med Rx; g. 10/2016 Ant STEMI: PTCA 100p LAD.   Chronic combined systolic and diastolic CH[place] ([place]ive heart failure) [place]    a. 03/2016 Echo: EF 30-35%, Gr2 DD;  b. 08/2016 Echo: EF 40%.   Depression    Essential hypertension    GAD (generalized anxiety disorder)    GERD (gastroesophageal reflux disease)    Hypercholesteremia    Ischemic cardiomyopathy    a. prior EF 25-35 percent at cath, 35-40 percent by echo; b. 03/2016 Echo: EF 30-35%, diff HK, Gr2 DD; c. 08/2016 Echo: EF 40%, base/mid inferior/inferowseptal AK, mildly dil LA.   Morbid obesity ([place]    Pre-diabetes    [place]   PRESUMED AVNRT, ECHO 12/07 WITH EF 65%, MILD LAE   Tobacco abuse    a. 20 + pack years, quit 10/2016.   Past Surgical History:  Past Surgical History:  Procedure Laterality Date   CARDIAC CATHETERIZATION N/A 04/23/2015   Procedure: Left Heart Cath and Coronary Angiography;  Surgeon: PJosue Hector MD;  [facility];  Service: Cardiovascular;  Laterality: N/A;   CARDIAC CATHETERIZATION N/A  04/23/2015   Procedure: Coronary Stent Intervention;  Surgeon: Sherren Mocha, MD;  Location: [facility];  Service: Cardiovascular;  Laterality: N/A;   CARDIAC CATHETERIZATION N/A 09/14/2015   Procedure: Left Heart Cath and Coronary Angiography;  Surgeon: Belva Crome, MD;  LAD 100%, CFX 10% ISR, RCA 100% (chronic), EF 25-35%   CARDIAC CATHETERIZATION N/A 09/14/2015   Procedure: Coronary Stent Intervention;  Surgeon: Belva Crome, MD;  Location: [place] [facility] [facility];  Service: Cardiovascular;  Laterality: N/A; 3.0 x 16 mm Synergy DES LAD   CARDIAC CATHETERIZATION  03/16/2016    CARDIAC CATHETERIZATION N/A 03/16/2016   Procedure: Left Heart Cath and Coronary Angiography;  Surgeon: Belva Crome, MD;  Location: [facility];  Service: Cardiovascular;  Laterality: N/A;   CARDIAC CATHETERIZATION N/A 03/16/2016   Procedure: Coronary Balloon Angioplasty;  Surgeon: Belva Crome, MD;  Location: [place] [facility];  Service: Cardiovascular;  Laterality: N/A;   CARDIAC CATHETERIZATION N/A 03/25/2016   Procedure: Left Heart Cath and Coronary Angiography;  Surgeon: Peter M Martinique, MD;  Location: [facility];  Service: Cardiovascular;  Laterality: N/A;   CORONARY ANGIOPLASTY WITH STENT PLACEMENT  2010; 2013   CORONARY BALLOON ANGIOPLASTY N/A 10/19/2016   Procedure: Coronary Balloon Angioplasty;  Surgeon: Jettie Booze, MD;  Location: [place] [facility];  Service: Cardiovascular;  Laterality: N/A;   INTRAVASCULAR ULTRASOUND/IVUS N/A 10/19/2016   Procedure: Intravascular Ultrasound/IVUS;  Surgeon: Jettie Booze, MD;  Location: [place] [facility];  Service: Cardiovascular;  Laterality: N/A;   LEFT HEART CATH AND CORONARY ANGIOGRAPHY N/A 10/19/2016   Procedure: Left Heart Cath and Coronary Angiography;  Surgeon: Jettie Booze, MD;  Location: [place] [facility];  Service: Cardiovascular;  Laterality: N/A;   RIGHT/LEFT HEART CATH AND CORONARY ANGIOGRAPHY N/A 05/21/2020   Procedure: RIGHT/LEFT HEART CATH AND CORONARY ANGIOGRAPHY;  Surgeon: Larey Dresser, MD;  Location: [facility];  Service: Cardiovascular;  Laterality: N/A;   Social History:  reports that he quit smoking about 4 years ago. His smoking use included cigarettes. He has a 24.00 pack-year smoking history. He has never used smokeless tobacco. He reports current drug use. Drug: Marijuana. He reports that he does not drink alcohol. Family History:  Family History  Problem Relation Age of Onset   Arrhythmia Mother        MOTHER LIVED TO BE 17   Coronary artery disease Mother    Heart attack Mother     Hypertension Mother    Coronary artery disease Father 11   Heart disease Father    Heart attack Father    Heart attack Brother    Stroke Neg Hx      HOME MEDICATIONS: Allergies as of 07/23/2021   No Known Allergies      Medication List        Accurate as of July 23, 2021  8:10 AM. If you have any questions, ask your nurse or doctor.          acetaminophen 325 MG tablet Commonly known as: TYLENOL Take 2 tablets (650 mg total) by mouth every 4 (four) hours as needed for headache or mild pain.   aspirin 81 MG EC tablet Take 1 tablet (81 mg total) by mouth daily.   atorvastatin 80 MG tablet Commonly known as: LIPITOR TAKE 1 TABLET BY MOUTH EVERY DAY AT 6 PM   BiDil 20-37.5 MG tablet Generic drug: isosorbide-hydrALAZINE Take 1 tablet by mouth 3 (three)  times daily.   blood glucose meter kit and supplies Dispense based on patient and insurance preference. Use up to four times daily as directed. (FOR ICD-10 E10.9, E11.9).   buPROPion 150 MG 24 hr tablet Commonly known as: WELLBUTRIN XL TAKE 1 TABLET BY MOUTH EVERY DAY   carvedilol 12.5 MG tablet Commonly known as: COREG Take 1 tablet (12.5 mg total) by mouth 2 (two) times daily.   clotrimazole-betamethasone cream Commonly known as: LOTRISONE Apply 1 application topically as needed.   dapagliflozin propanediol 10 MG Tabs tablet Commonly known as: Farxiga Take 1 tablet (10 mg total) by mouth daily before breakfast.   digoxin 0.125 MG tablet Commonly known as: LANOXIN TAKE 1 TABLET (0.125 MG TOTAL) BY MOUTH DAILY. PLEASE MAKE OVERDUE APPT BEFORE REFILLING   Entresto 97-103 MG Generic drug: sacubitril-valsartan Take 1 tablet by mouth 2 (two) times daily.   fenofibrate 160 MG tablet Take 1 tablet (160 mg total) by mouth daily.   furosemide 40 MG tablet Commonly known as: LASIX Take 1 tablet (40 mg total) by mouth every morning AND 0.5 tablets (20 mg total) every evening.   icosapent Ethyl 1 g  capsule Commonly known as: Vascepa Take 2 capsules (2 g total) by mouth 2 (two) times daily.   Lantus SoloStar 100 UNIT/ML Solostar Pen Generic drug: insulin glargine Inject 26 Units into the skin daily.   metFORMIN 500 MG 24 hr tablet Commonly known as: GLUCOPHAGE-XR Take 1 tablet (500 mg total) by mouth 2 (two) times daily.   nitroGLYCERIN 0.4 MG SL tablet Commonly known as: NITROSTAT Place 1 tablet (0.4 mg total) under the tongue every 5 (five) minutes as needed for chest pain.   Ozempic (1 MG/DOSE) 4 MG/3ML Sopn Generic drug: Semaglutide (1 MG/DOSE) Inject 1 mg into the skin once a week.   Pen Needles 3/16" 31G X 5 MM Misc Lantus 23   potassium chloride SA 20 MEQ tablet Commonly known as: KLOR-CON M Take 2 tablets (40 mEq total) by mouth daily. Please make yearly appt with Dr. Johnsie Cancel for January 2022 for future refills. 1st attempt   Repatha SureClick 277 MG/ML Soaj Generic drug: Evolocumab Inject 1 pen into the skin every 14 (fourteen) days.   spironolactone 25 MG tablet Commonly known as: ALDACTONE TAKE 1 TABLET BY MOUTH EVERY DAY   triamcinolone cream 0.1 % Commonly known as: KENALOG Apply 1 application topically as needed.         OBJECTIVE:   Vital Signs: BP 120/80   Pulse 70   Wt 259 lb 6.4 oz (117.7 kg)   SpO2 97%   BMI 40.63 kg/m   Wt Readings from Last 3 Encounters:  07/23/21 259 lb 6.4 oz (117.7 kg)  05/07/21 269 lb (122 kg)  04/05/21 266 lb 3.2 oz (120.7 kg)     Exam: General: Pt appears well and is in NAD  Lungs: Clear with good BS bilat with no rales, rhonchi, or wheezes  Heart: RRR   Extremities: No pretibial edema.   Neuro: MS is good with appropriate affect, pt is alert and Ox3   DM FOOT Exam : pt would like to postpone this until next visit   DATA REVIEWED:  Lab Results  Component Value Date   HGBA1C 7.5 (H) 03/19/2021   HGBA1C 6.8 (A) 09/23/2020   HGBA1C 11.5 (H) 06/03/2020   Lab Results  Component Value Date    MICROALBUR 42.4 (H) 03/19/2021   LDLCALC 47 02/01/2021   CREATININE 1.28 (H) 04/05/2021  Lab Results  Component Value Date   MICRALBCREAT 72.8 (H) 03/19/2021     Lab Results  Component Value Date   CHOL 121 02/01/2021   HDL 47 02/01/2021   LDLCALC 47 02/01/2021   LDLDIRECT 51 02/01/2021   TRIG 161 (H) 02/01/2021   CHOLHDL 2.6 02/01/2021       Results for KENDERICK, KOBLER (MRN [id number]) as of 03/19/2021 13:31  Ref. Range 03/19/2021 08:18  Sodium Latest Ref Range: 135 - 145 mEq/L 138  Potassium Latest Ref Range: 3.5 - 5.1 mEq/L 4.2  Chloride Latest Ref Range: 96 - 112 mEq/L 105  CO2 Latest Ref Range: 19 - 32 mEq/L 26  Glucose Latest Ref Range: 70 - 99 mg/dL 132 (H)  BUN Latest Ref Range: 6 - 23 mg/dL 18  Creatinine Latest Ref Range: 0.40 - 1.50 mg/dL 1.43  Calcium Latest Ref Range: 8.4 - 10.5 mg/dL 9.5  GFR Latest Ref Range: >60.00 mL/min 58.55 (L)  MICROALB/CREAT RATIO Latest Ref Range: 0.0 - 30.0 mg/g 72.8 (H)  Hemoglobin A1C Latest Ref Range: 4.6 - 6.5 % 7.5 (H)  Creatinine,U Latest Units: mg/dL 58.3  Microalb, Ur Latest Ref Range: 0.0 - 1.9 mg/dL 42.4 (H)    ASSESSMENT / PLAN / RECOMMENDATIONS:   1) Type 2 Diabetes Mellitus, Optimally controlled, With microalbuminuria - Most recent A1c of 6.4 %. Goal A1c < 7.0 %.     - A1c is optimal  - Intolerant to higher doses of Metformin  - He was started on Ozempic through his cardiologist to help with weight loss, patient did lose approximately 10 LB's this also has helped with his glucose control -No changes today -We discussed the importance of low-carb diet and avoiding juices unless he has hypoglycemia -No glucose data today  MEDICATIONS: Continue Lantus 26 units daily  Continue Metformin 500 mg XR BID Continue Farxiga 10 mg daily  Continue Ozempic 1 mg weekly    EDUCATION / INSTRUCTIONS: BG monitoring instructions: Patient is instructed to check his blood sugars 1 times a day, fasting. Call [facility]  cl[facility] if: BG persistently < 70  I reviewed the Rule of 15 for the treatment of hypoglycemia in detail with the patient. Literature supplied.    2) Diabetic complications:  Eye: Does not have known diabetic retinopathy.  Neuro/ Feet: Does not have known diabetic peripheral neuropathy .  Renal: Patient does not have known baseline CKD. He   is on an ACEI/ARB at present.   3) Dyslipidemia:   -He had an elevated TG up to 671 mg/DL in 10/2020, with improvement in his readings -Managed by cardiology   Medication Atorvastatin 80 mg daily  Repatha 140 mg q. 14 days    4) Microalbuminuria:  -Slight elevation in the MA/CR ratio at 72.8 (03/2021) -The patient is already on valsartan -We will continue to monitor  F/U in 6 months   Signed electronically by: Mack Guise, MD  [facility] Endocrinology  [address] [place], [place] [address] Phone: [phone] FAX: [phone]   CC: Maximiano Coss, NP [address] [place] [address] Phone: [phone]  Fax: [phone]  Return to Endocrinology clinic as below: No future appointments.

## 2021-07-26 NOTE — Telephone Encounter (Addendum)
Per Jasmine December, Patient called [facility] to delay his cpap set up until after January 1.

## 2021-08-24 ENCOUNTER — Telehealth: Payer: Self-pay | Admitting: Pharmacist

## 2021-08-24 DIAGNOSIS — G4733 Obstructive sleep apnea (adult) (pediatric): Secondary | ICD-10-CM | POA: Diagnosis not present

## 2021-08-24 NOTE — Telephone Encounter (Signed)
Called pt to follow up with Ozempic. His dose was increased from 0.5mg  to 1mg  when I last spoke with him in November. Pt states that his pharmacy has only been refilling the 0.25-0.5mg  dose and he's still been taking 0.25mg  weekly but that he's lost 10 lbs so far.  Again will increase dose to 0.5mg  weekly for the next 4 weeks, then will call pt and plan to increase to the 1mg  dose.

## 2021-09-02 ENCOUNTER — Other Ambulatory Visit: Payer: Self-pay | Admitting: Cardiology

## 2021-09-15 DIAGNOSIS — E119 Type 2 diabetes mellitus without complications: Secondary | ICD-10-CM | POA: Diagnosis not present

## 2021-09-21 ENCOUNTER — Telehealth: Payer: Self-pay | Admitting: Pharmacist

## 2021-09-21 MED ORDER — OZEMPIC (1 MG/DOSE) 4 MG/3ML ~~LOC~~ SOPN: 1.0000 mg | PEN_INJECTOR | SUBCUTANEOUS | 5 refills | Status: DC

## 2021-09-21 NOTE — Telephone Encounter (Signed)
Called pt to follow up with Ozempic. Pt reports he has given 2 of the 0.5mg  dose so far and is tolerating well. He is aware to finish next 2 injections of 0.5mg . I have sent in another refill of the 1mg  dose for him to pick up next month. Will call in 1 month for tolerability update and continued titration.

## 2021-09-24 DIAGNOSIS — G4733 Obstructive sleep apnea (adult) (pediatric): Secondary | ICD-10-CM | POA: Diagnosis not present

## 2021-10-01 ENCOUNTER — Other Ambulatory Visit: Payer: Self-pay | Admitting: Cardiology

## 2021-10-01 DIAGNOSIS — E785 Hyperlipidemia, unspecified: Secondary | ICD-10-CM

## 2021-10-14 ENCOUNTER — Telehealth: Payer: Self-pay | Admitting: Pharmacist

## 2021-10-14 MED ORDER — OZEMPIC (2 MG/DOSE) 8 MG/3ML ~~LOC~~ SOPN: 2.0000 mg | PEN_INJECTOR | SUBCUTANEOUS | 11 refills | Status: DC

## 2021-10-14 NOTE — Telephone Encounter (Signed)
Called pt to follow up on Ozempic, has given 2 doses of the 1mg  dose now. Tolerating well, not eating as much. Most recent home weight around 251 lbs. Sees Dr in a few weeks in office so will have updated weight and vitals checked then. Will send in refill for maintenance 2mg  weekly dose. Pt aware to pick up higher dose when he refills Ozempic next. ?

## 2021-10-22 DIAGNOSIS — G4733 Obstructive sleep apnea (adult) (pediatric): Secondary | ICD-10-CM | POA: Diagnosis not present

## 2021-11-02 ENCOUNTER — Ambulatory Visit (INDEPENDENT_AMBULATORY_CARE_PROVIDER_SITE_OTHER): Payer: Medicare Other | Admitting: Cardiology

## 2021-11-02 ENCOUNTER — Other Ambulatory Visit: Payer: Self-pay

## 2021-11-02 VITALS — BP 126/60 | HR 80 | Ht 67.0 in | Wt 255.2 lb

## 2021-11-02 DIAGNOSIS — G4733 Obstructive sleep apnea (adult) (pediatric): Secondary | ICD-10-CM | POA: Diagnosis not present

## 2021-11-02 DIAGNOSIS — I10 Essential (primary) hypertension: Secondary | ICD-10-CM

## 2021-11-02 NOTE — Progress Notes (Signed)
? ?Date:  11/02/2021  ? ?ID:  Peter Russo, DOB 07/07/1976, MRN [id number] ? ?PCP:  Maximiano Coss, NP  ?Cardiologist:  Jenkins Rouge, MD  ?Sleep Medicine:  Fransico Him, MD ?Electrophysiologist:  None  ? ?Chief Complaint:  OSA ? ?History of Present Illness:   ?  ?This is a 46yo male with a hx of ASCAD, HTN, ischemic DCM and chronic CHF who was referred by Dr. Caryl Comes for evaluation of OSA due to CHF, obesity and snoring.  He was found to have severe OSA with an AHI of 66.5/hr with loud snoring, mild central sleep apnea (CAI 8/hr) and O2 sats as low as 82%.  He underwent CPAP titration but due to ongoing respiratory events, was changed to BiPAP at 16/12cm H2O.   ? ?He has had a lot of problems tolerating the PAP mask.  He felt like he was suffocating with the mask.  He switched to a nasal mask but it was the same problem.  He is a mouth breather.  I referred him to his DME for a mask desensitization but was noncompliant and had to turn his device back in.  In order to get a new device he had to undergo a repeat split-night sleep study which showed severe obstructive sleep apnea with an AHI 30.2/h and O2 saturations as low as 85%.  He was titrated to 12 cm H2O.  He is now here for follow-up.   ? ?He is still having problems tolerating the device.  He says that he tosses and turns a lot during the night so he has been trying to sleep on the couch to sleep with it. He has always slept restless.  He tolerates the mask and feels the pressure is adequate.  He wants to be compliant with his device.  He has a nasal mask now and is doing well with it compared to the prior FFM. ?Prior CV studies:   ?The following studies were reviewed today: ? ?PAP download compliance and split night sleep study ? ?Past Medical History:  ?Diagnosis Date  ? Anxiety   ? CAD S/P percutaneous coronary angioplasty   ? a. s/p BMS-mLAD 2010. b. DES to RCA 2012. c. 04/2015: DES to LCx and PCI to LAD c/b dissection with subsequent overlapping DES to  mLAD - r/i for NSTEMI afterwards; d. 08/2015 Ant STEMI in setting of noncompliance->15mAD ISR (3.0x16 Synergy DES); e. 03/2016 PCI of apical LAD; f. 03/2016 Relook Cath: patent LAD stents, RCA 100p CTO-->Med Rx; g. 10/2016 Ant STEMI: PTCA 100p LAD.  ? Chronic combined systolic [place] diastolic [place]   ? a. 03/2016 Echo: EF 30-35%, Gr2 DD;  b. 08/2016 Echo: EF 40%.  ? Depression   ? Essential hypertension   ? GAD (generalized anxiety disorder)   ? GERD (gastroesophageal reflux disease)   ? Hypercholesteremia   ? Ischemic cardiomyopathy   ? a. prior EF 25-35 percent at cath, 35-40 percent by echo; b. 03/2016 Echo: EF 30-35%, diff HK, Gr2 DD; c. 08/2016 Echo: EF 40%, base/mid inferior/inferowseptal AK, mildly dil LA.  ? Morbid [place] ([place]   ? Pre-diabetes   ? [place] APR[place] 10 Y[place]  ? PRESUMED AVNRT, ECHO 12/07 WITH EF 65%, MILD LAE  ? Tobacco abuse   ? a. 20 + pack years, quit 10/2016.  ? ?Past Surgical History:  ?Procedure Laterality Date  ? CARDIAC CATHETERIZATION N/A 04/23/2015  ? Procedure: Left Heart Cath and Coronary Angiography;  Surgeon: PCollier Salina  Tommy Rainwater, MD;  Location: [place] [facility];  Service: Cardiovascular;  Laterality: N/A;  ? CARDIAC CATHETERIZATION N/A 04/23/2015  ? Procedure: Coronary Stent Intervention;  Surgeon: Sherren Mocha, MD;  Location: [place] [facility];  Service: Cardiovascular;  Laterality: N/A;  ? CARDIAC CATHETERIZATION N/A 09/14/2015  ? Procedure: Left Heart Cath and Coronary Angiography;  Surgeon: Belva Crome, MD;  LAD 100%, CFX 10% ISR, RCA 100% (chronic), EF 25-35%  ? CARDIAC CATHETERIZATION N/A 09/14/2015  ? Procedure: Coronary Stent Intervention;  Surgeon: Belva Crome, MD;  Location: [place] [facility];  Service: Cardiovascular;  Laterality: N/A; 3.0 x 16 mm Synergy DES LAD  ? CARDIAC CATHETERIZATION  03/16/2016  ? CARDIAC CATHETERIZATION N/A 03/16/2016  ? Procedure: Left Heart Cath and Coronary Angiography;  Surgeon: Belva Crome, MD;  Location: [place] [facility];  Service: Cardiovascular;  Laterality: N/A;  ? CARDIAC CATHETERIZATION N/A 03/16/2016  ? Procedure: Coronary Balloon Angioplasty;  Surgeon: Belva Crome, MD;  Location: [place] [facility];  Service: Cardiovascular;  Laterality: N/A;  ? CARDIAC CATHETERIZATION N/A 03/25/2016  ? Procedure: Left Heart Cath and Coronary Angiography;  Surgeon: Peter M Martinique, MD;  Location: [place] [facility];  Service: Cardiovascular;  Laterality: N/A;  ? CORONARY ANGIOPLASTY WITH STENT PLACEMENT  2010; 2013  ? CORONARY BALLOON ANGIOPLASTY N/A 10/19/2016  ? Procedure: Coronary Balloon Angioplasty;  Surgeon: Jettie Booze, MD;  Location: [place] [facility];  Service: Cardiovascular;  Laterality: N/A;  ? INTRAVASCULAR ULTRASOUND/IVUS N/A 10/19/2016  ? Procedure: Intravascular Ultrasound/IVUS;  Surgeon: Jettie Booze, MD;  Location: [place] [facility];  Service: Cardiovascular;  Laterality: N/A;  ? LEFT HEART CATH AND CORONARY ANGIOGRAPHY N/A 10/19/2016  ? Procedure: Left Heart Cath and Coronary Angiography;  Surgeon: Jettie Booze, MD;  Location: [place] [facility];  Service: Cardiovascular;  Laterality: N/A;  ? RIGHT/LEFT HEART CATH AND CORONARY ANGIOGRAPHY N/A 05/21/2020  ? Procedure: RIGHT/LEFT HEART CATH AND CORONARY ANGIOGRAPHY;  Surgeon: Larey Dresser, MD;  Location: [place] [facility];  Service: Cardiovascular;  Laterality: N/A;  ?  ? ?Current Meds  ?Medication Sig  ? acetaminophen (TYLENOL) 325 MG tablet Take 2 tablets (650 mg total) by mouth every 4 (four) hours as needed for headache or mild pain.  ? aspirin 81 MG EC tablet Take 1 tablet (81 mg total) by mouth daily.  ? atorvastatin (LIPITOR) 80 MG tablet TAKE 1 TABLET BY MOUTH EVERY DAY AT 6 PM  ? blood glucose meter kit and supplies Dispense based on patient and insurance preference. Use up to four times daily as directed. (FOR ICD-10 E10.9, E11.9).  ? buPROPion (WELLBUTRIN XL) 150 MG 24 hr tablet TAKE 1 TABLET BY MOUTH  EVERY DAY  ? carvedilol (COREG) 12.5 MG tablet Take 1 tablet (12.5 mg total) by mouth 2 (two) times daily.  ? clotrimazole-betamethasone (LOTRISONE) cream Apply 1 application topically as needed.  ? dapagliflozin propanediol (FARXIGA) 10 MG TABS tablet Take 1 tablet (10 mg total) by mouth daily before breakfast.  ? digoxin (LANOXIN) 0.125 MG tablet TAKE 1 TABLET (0.125 MG TOTAL) BY MOUTH DAILY. PLEASE MAKE OVERDUE APPT BEFORE REFILLING  ? Evolocumab (REPATHA SURECLICK) 270 MG/ML SOAJ INJECT 1 PEN INTO THE SKIN EVERY 14 (FOURTEEN) DAYS.  ? fenofibrate 160 MG tablet Take 1 tablet (160 mg total) by mouth daily.  ? furosemide (LASIX) 40 MG tablet Take 1 tablet (40 mg total) by mouth every morning AND 0.5 tablets (20 mg  total) every evening.  ? icosapent Ethyl (VASCEPA) 1 g capsule Take 2 capsules (2 g total) by mouth 2 (two) times daily.  ? insulin glargine (LANTUS SOLOSTAR) 100 UNIT/ML Solostar Pen Inject 26 Units into the skin daily.  ? Insulin Pen Needle (PEN NEEDLES 3/16") 31G X 5 MM MISC Lantus 23  ? isosorbide-hydrALAZINE (BIDIL) 20-37.5 MG tablet Take 1 tablet by mouth 3 (three) times daily.  ? metFORMIN (GLUCOPHAGE-XR) 500 MG 24 hr tablet Take 1 tablet (500 mg total) by mouth 2 (two) times daily.  ? nitroGLYCERIN (NITROSTAT) 0.4 MG SL tablet Place 1 tablet (0.4 mg total) under the tongue every 5 (five) minutes as needed for chest pain.  ? potassium chloride SA (KLOR-CON) 20 MEQ tablet Take 2 tablets (40 mEq total) by mouth daily. Please make yearly appt with Dr. Johnsie Cancel for January 2022 for future refills. 1st attempt  ? sacubitril-valsartan (ENTRESTO) 97-103 MG Take 1 tablet by mouth 2 (two) times daily.  ? Semaglutide, 2 MG/DOSE, (OZEMPIC, 2 MG/DOSE,) 8 MG/3ML SOPN Inject 2 mg into the skin once a week.  ? spironolactone (ALDACTONE) 25 MG tablet TAKE 1 TABLET BY MOUTH EVERY DAY  ? triamcinolone cream (KENALOG) 0.1 % Apply 1 application topically as needed.  ?  ? ?Allergies:   Patient has no known allergies.   ? ?Social History  ? ?Tobacco Use  ? Smoking status: Former  ?  Packs/day: 1.00  ?  Years: 24.00  ?  Pack years: 24.00  ?  Types: Cigarettes  ?  Quit date: 11/03/2016  ?  Years since quitting: 5.0  ? [place]

## 2021-11-02 NOTE — Patient Instructions (Signed)
Medication Instructions:  °Your physician recommends that you continue on your current medications as directed. Please refer to the Current Medication list given to you today.  °*If you need a refill on your cardiac medications before your next appointment, please call your pharmacy* ° °Follow-Up: °At [facility], you and your health needs are our priority.  As part of our continuing mission to provide you with exceptional heart care, we have created designated Provider Care Teams.  These Care Teams include your primary Cardiologist (physician) and Advanced Practice Providers (APPs -  Physician Assistants and Nurse Practitioners) who all work together to provide you with the care you need, when you need it. ° °Your next appointment:   °8 week(s) ° °The format for your next appointment:   °Virtual Visit  ° °Provider:   °[name] Turner, MD °

## 2021-11-08 ENCOUNTER — Telehealth: Payer: Self-pay | Admitting: Cardiovascular Disease

## 2021-11-08 NOTE — Telephone Encounter (Signed)
Patient is calling about handicap sticker ?

## 2021-11-09 ENCOUNTER — Other Ambulatory Visit (HOSPITAL_COMMUNITY): Payer: Self-pay | Admitting: Cardiology

## 2021-11-09 NOTE — Progress Notes (Signed)
Opened in error

## 2021-11-09 NOTE — Telephone Encounter (Signed)
Spoke with the patient and advised that Dr. Eden Russo would not be able to sign his form for his handicap placard since it has been greater than 4 years since he has been seen.  ?Advised that Dr. Mayford Russo could not sign for it either as she only sees him for sleep apnea and does not treat the conditions in which he is needing the placard for.  ?He is going to check with Dr. Alford Russo office to see if they are able to complete it for him.  ?

## 2021-11-11 ENCOUNTER — Other Ambulatory Visit: Payer: Self-pay | Admitting: Cardiovascular Disease

## 2021-11-12 ENCOUNTER — Other Ambulatory Visit (HOSPITAL_COMMUNITY): Payer: Self-pay | Admitting: Cardiology

## 2021-11-22 DIAGNOSIS — G4733 Obstructive sleep apnea (adult) (pediatric): Secondary | ICD-10-CM | POA: Diagnosis not present

## 2021-11-23 IMAGING — MR MR CARD MORPHOLOGY WO/W CM
39 of 41 series · 39 of 41 positions shown · IV contrast (Gadavist)
Comparison: none

CLINICAL DATA: Cardiomyopathy

EXAM:
CARDIAC MRI
TECHNIQUE: The patient was scanned on a 1.5 Tesla GE magnet. A dedicated
cardiac coil was used. Functional imaging was done using Fiesta
sequences. [DATE], and 4 chamber views were done to assess for RWMA's.
Modified Lejla rule using a short axis stack was used to
calculate an ejection fraction on a dedicated work station using
Circle software. The patient received 14 cc of Gadavist. After 10
minutes inversion recovery sequences were used to assess for
infiltration and scar tissue.
CONTRAST:  14 cc Gadavist

[Series 6: bSSFP · sagittal · 8.0mm · 1.61mm/px · 1 of 25 slices shown (1 of 24)]
[im 1/25]
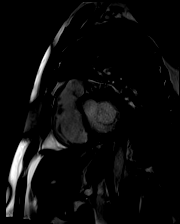

[Series 6: bSSFP · sagittal · 8.0mm · 1.61mm/px · 1 of 25 slices shown (2 of 24)]
[im 1/25]
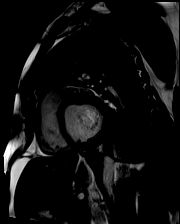

[Series 6: bSSFP · sagittal · 8.0mm · 1.61mm/px · 1 of 25 slices shown (3 of 24)]
[im 1/25]
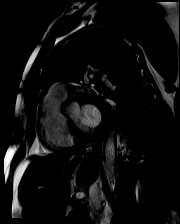

[Series 6: bSSFP · sagittal · 8.0mm · 1.61mm/px · 1 of 25 slices shown (4 of 24)]
[im 1/25]
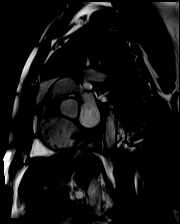

[Series 8: (id)_long_t1_moco · sagittal · 8.0mm · 1.48mm/px · 1 of 24 slices shown]
[im 1/24]
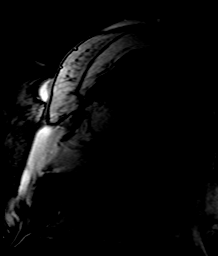

[Series 9: (id)_long_t1_moco_t1 · sagittal · 8.0mm · 1.48mm/px · 1 of 6 slices shown]
[im 1/6]
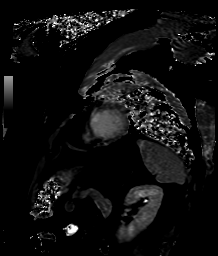

[Series 11: bSSFP · sagittal · 8.0mm · 1.61mm/px · 1 of 25 slices shown (5 of 24)]
[im 1/25]
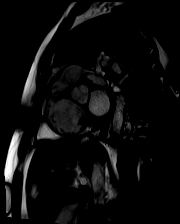

[Series 11: bSSFP · sagittal · 8.0mm · 1.61mm/px · 1 of 25 slices shown (6 of 24)]
[im 1/25]
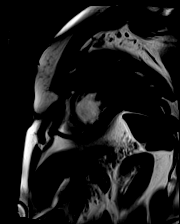

[Series 11: bSSFP · sagittal · 8.0mm · 1.61mm/px · 1 of 25 slices shown (7 of 24)]
[im 1/25]
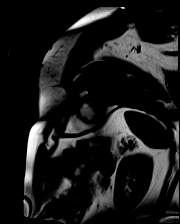

[Series 11: bSSFP · sagittal · 8.0mm · 1.61mm/px · 1 of 25 slices shown (8 of 24)]
[im 1/25]
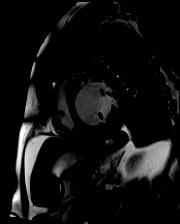

[Series 11: bSSFP · sagittal · 8.0mm · 1.61mm/px · 1 of 25 slices shown (9 of 24)]
[im 1/25]
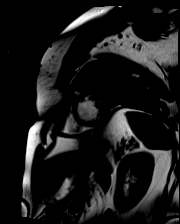

[Series 11: bSSFP · sagittal · 8.0mm · 1.61mm/px · 1 of 25 slices shown (10 of 24)]
[im 1/25]
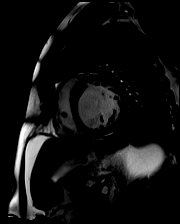

[Series 11: bSSFP · sagittal · 8.0mm · 1.61mm/px · 1 of 25 slices shown (11 of 24)]
[im 1/25]
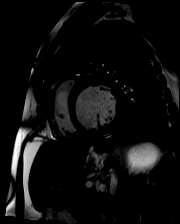

[Series 11: bSSFP · sagittal · 8.0mm · 1.61mm/px · 1 of 25 slices shown (12 of 24)]
[im 1/25]
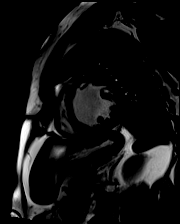

[Series 11: bSSFP · sagittal · 8.0mm · 1.61mm/px · 1 of 25 slices shown (13 of 24)]
[im 1/25]
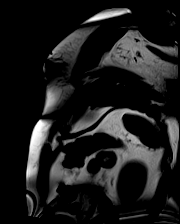

[Series 11: bSSFP · sagittal · 8.0mm · 1.61mm/px · 1 of 25 slices shown (14 of 24)]
[im 1/25]
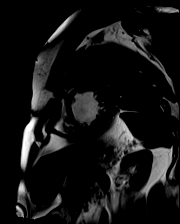

[Series 11: bSSFP · sagittal · 8.0mm · 1.61mm/px · 1 of 25 slices shown (15 of 24)]
[im 1/25]
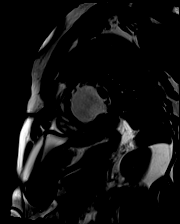

[Series 11: bSSFP · sagittal · 8.0mm · 1.61mm/px · 1 of 25 slices shown (16 of 24)]
[im 1/25]
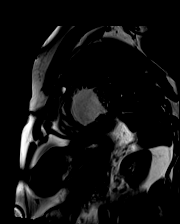

[Series 11: bSSFP · sagittal · 8.0mm · 1.61mm/px · 1 of 25 slices shown (17 of 24)]
[im 1/25]
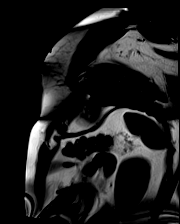

[Series 11: bSSFP · sagittal · 8.0mm · 1.61mm/px · 1 of 25 slices shown (18 of 24)]
[im 1/25]
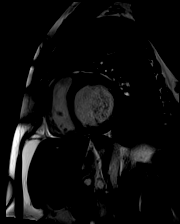

[Series 11: bSSFP · sagittal · 8.0mm · 1.61mm/px · 1 of 25 slices shown (19 of 24)]
[im 1/25]
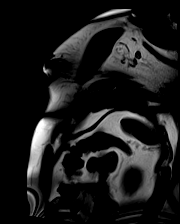

[Series 11: bSSFP · sagittal · 8.0mm · 1.61mm/px · 1 of 25 slices shown (20 of 24)]
[im 1/25]
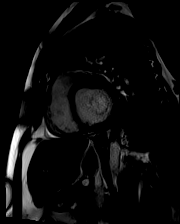

[Series 11: bSSFP · sagittal · 8.0mm · 1.61mm/px · 1 of 25 slices shown (21 of 24)]
[im 1/25]
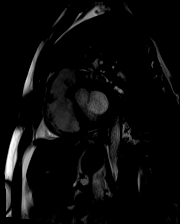

[Series 13: (id)_trufi_moco · sagittal · 8.0mm · 1.98mm/px · 1 of 9 slices shown]
[im 1/9]
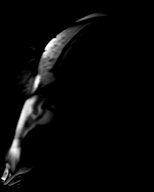

[Series 17: bSSFP · oblique · 7.5mm · 1.48mm/px · 1 of 25 slices shown (22 of 24)]
[im 1/25]
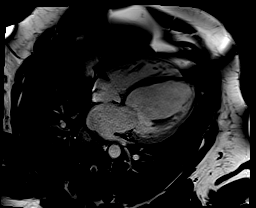

[Series 18: bSSFP · axial · 7.5mm · 1.48mm/px · 1 of 25 slices shown (23 of 24)]
[im 1/25]
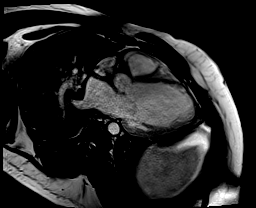

[Series 19: bSSFP · coronal · 7.5mm · 1.48mm/px · 1 of 25 slices shown (24 of 24)]
[im 1/25]
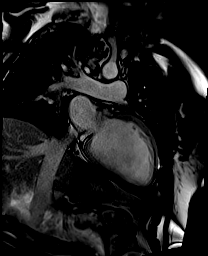

[Series 21: lge_single shot sa · sagittal · 8.0mm · 1.98mm/px · 1 of 17 slices shown (1 of 2)]
[im 1/17]
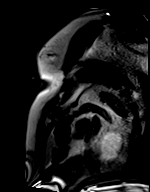

[Series 22: lge_single shot sa · sagittal · 8.0mm · 1.98mm/px · 1 of 17 slices shown (2 of 2)]
[im 1/17]
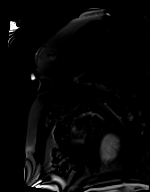

[Series 23: lge_single shot radial_mag · axial · 6.0mm · 1.98mm/px · 1 of 1 slices shown (1 of 2)]
[im 1/1]
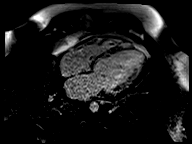

[Series 24: lge_single shot radial_psir · axial · 6.0mm · 1.98mm/px · 1 of 1 slices shown (1 of 2)]
[im 1/1]
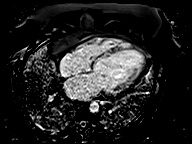

[Series 25: lge_single shot radial_mag · axial · 6.0mm · 1.98mm/px · 1 of 1 slices shown (2 of 2)]
[im 1/1]
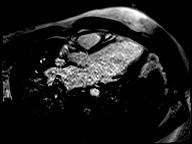

[Series 26: lge_single shot radial_psir · axial · 6.0mm · 1.98mm/px · 1 of 1 slices shown (2 of 2)]
[im 1/1]
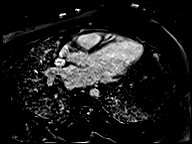

[Series 30: (id)_short_t1_moco · sagittal · 8.0mm · 1.48mm/px · 1 of 27 slices shown]
[im 1/27]
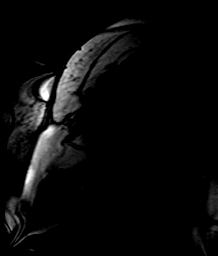

[Series 31: (id)_short_t1_moco_t1 · sagittal · 8.0mm · 1.48mm/px · 1 of 3 slices shown]
[im 1/3]
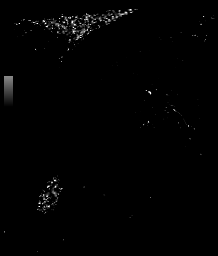

[Series 34: lge short axis_mag · sagittal · 8.0mm · 1.61mm/px · 1 of 17 slices shown]
[im 1/17]
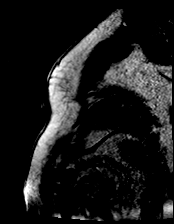

[Series 35: lge short axis_psir · sagittal · 8.0mm · 1.61mm/px · 1 of 17 slices shown]
[im 1/17]
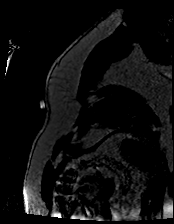

[Series 36: lge radial ((date)ch)_mag · axial · 6.0mm · 1.70mm/px · 1 of 1 slices shown]
[im 1/1]
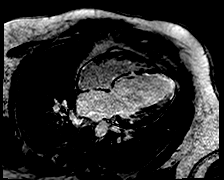

[Series 37: lge radial ((date)ch)_psir · axial · 6.0mm · 1.70mm/px · 1 of 1 slices shown]
[im 1/1]
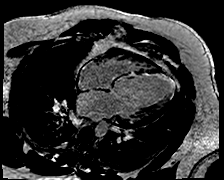

[39 of 41 positions shown; findings below may reference images not displayed]

FINDINGS: Limited images of the lung fields showed no gross abnormalities.

Severely dilated left ventricle. Normal LV wall thickness. Akinesis
of the mid to apical anterior, anteroseptal, and inferoseptal walls.
Akinesis of the apical inferior wall. Severe hypokinesis of the
inferior wall, the basal anteroseptal/inferoseptal walls, and the
apical lateral wall. Calculated EF 26%. No LV thrombus visualized.
The right ventricle was mildly dilated with mildly decreased
systolic function, EF 44%. Moderate left atrial enlargement. Normal
right atrium. Trileaflet aortic valve, no significant regurgitation
or stenosis noted. Mitral regurgitation looks mild.

Delayed enhancement:

Mid anterior, mid anteroseptal, mid inferoseptal walls with about
50% wall thickness subendocardial late gadolinium enhancement (LGE).

Apical septal and apical anterior walls and the true apex with
76-99% wall thickness subendocardial LGE.

Measurements:

LVEDV 336

LVSV 87
LVEF 26%

RVEDV 213 mL

RVSV 93 mL
RVEF 44%
IMPRESSION: 1. Severely dilated LV with wall motion abnormalities noted above,
EF 26%.

2.  Mildly dilated RV with EF 44%.

3.  LGE as noted above in LAD infarction pattern.

Consider CHF clinic evaluation.

Miriam Lilian Soppi

## 2021-12-22 DIAGNOSIS — G4733 Obstructive sleep apnea (adult) (pediatric): Secondary | ICD-10-CM | POA: Diagnosis not present

## 2021-12-28 ENCOUNTER — Other Ambulatory Visit (HOSPITAL_COMMUNITY): Payer: Self-pay

## 2021-12-28 ENCOUNTER — Encounter: Payer: Self-pay | Admitting: Cardiology

## 2021-12-28 ENCOUNTER — Telehealth (INDEPENDENT_AMBULATORY_CARE_PROVIDER_SITE_OTHER): Payer: Medicare Other | Admitting: Cardiology

## 2021-12-28 ENCOUNTER — Telehealth: Payer: Self-pay | Admitting: *Deleted

## 2021-12-28 VITALS — Ht 67.0 in | Wt 252.0 lb

## 2021-12-28 DIAGNOSIS — G4733 Obstructive sleep apnea (adult) (pediatric): Secondary | ICD-10-CM

## 2021-12-28 DIAGNOSIS — I10 Essential (primary) hypertension: Secondary | ICD-10-CM | POA: Diagnosis not present

## 2021-12-28 MED ORDER — ISOSORB DINITRATE-HYDRALAZINE 20-37.5 MG PO TABS: 1.0000 | ORAL_TABLET | Freq: Three times a day (TID) | ORAL | 1 refills | Status: DC

## 2021-12-28 NOTE — Progress Notes (Signed)
? ?Virtual Visit via Video Note  ? ?This visit type was conducted due to national recommendations for restrictions regarding the COVID-19 Pandemic (e.g. social distancing) in an effort to limit this patient's exposure and mitigate transmission in our community.  Due to his co-morbid illnesses, this patient is at least at moderate risk for complications without adequate follow up.  This format is felt to be most appropriate for this patient at this time.  All issues noted in this document were discussed and addressed.  A limited physical exam was performed with this format.  Please refer to the patient's chart for his consent to telehealth for [facility]. ? ?  ? ?Date:  12/28/2021  ? ?ID:  Peter Russo, DOB June 19, 1976, MRN [id number] ?The patient was identified using 2 identifiers. ? ?Patient Location: Home ?Provider Location: Office/Clinic ? ? ?Date:  12/28/2021  ? ?ID:  Peter Russo, DOB September 21, 1975, MRN [id number] ? ?PCP:  Maximiano Coss, NP  ?Cardiologist:  Jenkins Rouge, MD  ?Sleep Medicine:  Fransico Him, MD ?Electrophysiologist:  None  ? ?Chief Complaint:  OSA ? ?History of Present Illness:   ?  ?This is a 46yo male with a hx of ASCAD, HTN, ischemic DCM and chronic CHF who was referred by Dr. Caryl Comes for evaluation of OSA due to CHF, obesity and snoring.  He was found to have severe OSA with an AHI of 66.5/hr with loud snoring, mild central sleep apnea (CAI 8/hr) and O2 sats as low as 82%.  He underwent CPAP titration but due to ongoing respiratory events, was changed to BiPAP at 16/12cm H2O.   ? ?He has had a lot of problems tolerating the PAP mask.  He felt like he was suffocating with the mask.  He switched to a nasal mask but it was the same problem.  He is a mouth breather.  I referred him to his DME for a mask desensitization but was noncompliant and had to turn his device back in.  In order to get a new device he had to undergo a repeat split-night sleep study which showed severe obstructive  sleep apnea with an AHI 30.2/h and O2 saturations as low as 85%.  He was titrated to 12 cm H2O.   ? ?Unfortunately he has problems with his current living situation which has limited him being able to use his device.  He is trying to get a better living situation.  He says that he knows that he needs to use it and is trying to be more compliant.  When he does use it it works well for him.  ?Prior CV studies:   ?The following studies were reviewed today: ? ?PAP download compliance  ? ?Past Medical History:  ?Diagnosis Date  ? Anxiety   ? CAD S/P percutaneous coronary angioplasty   ? a. s/p BMS-mLAD 2010. b. DES to RCA 2012. c. 04/2015: DES to LCx and PCI to LAD c/b dissection with subsequent overlapping DES to mLAD - r/i for NSTEMI afterwards; d. 08/2015 Ant STEMI in setting of noncompliance->174mAD ISR (3.0x16 Synergy DES); e. 03/2016 PCI of apical LAD; f. 03/2016 Relook Cath: patent LAD stents, RCA 100p CTO-->Med Rx; g. 10/2016 Ant STEMI: PTCA 100p LAD.  ? Chronic combined systolic and diastolic CHF (congestive heart failure) ([place]   ? a. 03/2016 Echo: EF 30-35%, Gr2 DD;  b. 08/2016 Echo: EF 40%.  ? Depression   ? Essential hypertension   ? GAD (generalized anxiety disorder)   ? GERD (gastroesophageal reflux disease)   ?  Hypercholesteremia   ? Ischemic cardiomyopathy   ? a. prior EF 25-35 percent at cath, 35-40 percent by echo; b. 03/2016 Echo: EF 30-35%, diff HK, Gr2 DD; c. 08/2016 Echo: EF 40%, base/mid inferior/inferowseptal AK, mildly dil LA.  ? Morbid obesity ([place])   ? Pre-diabetes   ? SVT (supraventricular tachycardia) ([place]) APRROX 10 YRS AGO  ? PRESUMED AVNRT, ECHO 12/07 WITH EF 65%, MILD LAE  ? Tobacco abuse   ? a. 20 + pack years, quit 10/2016.  ? ?Past Surgical History:  ?Procedure Laterality Date  ? CARDIAC CATHETERIZATION N/A 04/23/2015  ? Procedure: Left Heart Cath and Coronary Angiography;  Surgeon: Josue Hector, MD;  Location: [place] [facility];  Service: Cardiovascular;  Laterality: N/A;  ? CARDIAC  CATHETERIZATION N/A 04/23/2015  ? Procedure: Coronary Stent Intervention;  Surgeon: Sherren Mocha, MD;  Location: [place] [facility];  Service: Cardiovascular;  Laterality: N/A;  ? CARDIAC CATHETERIZATION N/A 09/14/2015  ? Procedure: Left Heart Cath and Coronary Angiography;  Surgeon: Belva Crome, MD;  LAD 100%, CFX 10% ISR, RCA 100% (chronic), EF 25-35%  ? CARDIAC CATHETERIZATION N/A 09/14/2015  ? Procedure: Coronary Stent Intervention;  Surgeon: Belva Crome, MD;  Location: [place] [facility];  Service: Cardiovascular;  Laterality: N/A; 3.0 x 16 mm Synergy DES LAD  ? CARDIAC CATHETERIZATION  03/16/2016  ? CARDIAC CATHETERIZATION N/A 03/16/2016  ? Procedure: Left Heart Cath and Coronary Angiography;  Surgeon: Belva Crome, MD;  Location: [place] [facility];  Service: Cardiovascular;  Laterality: N/A;  ? CARDIAC CATHETERIZATION N/A 03/16/2016  ? Procedure: Coronary Balloon Angioplasty;  Surgeon: Belva Crome, MD;  Location: [place] [facility];  Service: Cardiovascular;  Laterality: N/A;  ? CARDIAC CATHETERIZATION N/A 03/25/2016  ? Procedure: Left Heart Cath and Coronary Angiography;  Surgeon: Peter M Martinique, MD;  Location: [place] [facility];  Service: Cardiovascular;  Laterality: N/A;  ? CORONARY ANGIOPLASTY WITH STENT PLACEMENT  2010; 2013  ? CORONARY BALLOON ANGIOPLASTY N/A 10/19/2016  ? Procedure: Coronary Balloon Angioplasty;  Surgeon: Jettie Booze, MD;  Location: [place] [facility];  Service: Cardiovascular;  Laterality: N/A;  ? INTRAVASCULAR ULTRASOUND/IVUS N/A 10/19/2016  ? Procedure: Intravascular Ultrasound/IVUS;  Surgeon: Jettie Booze, MD;  Location: [place] [facility];  Service: Cardiovascular;  Laterality: N/A;  ? LEFT HEART CATH AND CORONARY ANGIOGRAPHY N/A 10/19/2016  ? Procedure: Left Heart Cath and Coronary Angiography;  Surgeon: Jettie Booze, MD;  Location: [place] [facility];  Service: Cardiovascular;  Laterality: N/A;  ? RIGHT/LEFT HEART CATH AND CORONARY ANGIOGRAPHY N/A 05/21/2020  ?  Procedure: RIGHT/LEFT HEART CATH AND CORONARY ANGIOGRAPHY;  Surgeon: Larey Dresser, MD;  Location: [place] [facility];  Service: Cardiovascular;  Laterality: N/A;  ?  ? ?Current Meds  ?Medication Sig  ? acetaminophen (TYLENOL) 325 MG tablet Take 2 tablets (650 mg total) by mouth every 4 (four) hours as needed for headache or mild pain.  ? aspirin 81 MG EC tablet Take 1 tablet (81 mg total) by mouth daily.  ? atorvastatin (LIPITOR) 80 MG tablet TAKE 1 TABLET BY MOUTH EVERY DAY AT 6 PM  ? blood glucose meter kit and supplies Dispense based on patient and insurance preference. Use up to four times daily as directed. (FOR ICD-10 E10.9, E11.9).  ? buPROPion (WELLBUTRIN XL) 150 MG 24 hr tablet TAKE 1 TABLET BY MOUTH EVERY DAY  ? carvedilol (COREG) 12.5 MG tablet Take 1 tablet (12.5 mg total) by mouth  2 (two) times daily.  ? clotrimazole-betamethasone (LOTRISONE) cream Apply 1 application topically as needed.  ? dapagliflozin propanediol (FARXIGA) 10 MG TABS tablet Take 1 tablet (10 mg total) by mouth daily before breakfast.  ? digoxin (LANOXIN) 0.125 MG tablet TAKE 1 TABLET (0.125 MG TOTAL) BY MOUTH DAILY  ? Evolocumab (REPATHA SURECLICK) 295 MG/ML SOAJ INJECT 1 PEN INTO THE SKIN EVERY 14 (FOURTEEN) DAYS.  ? fenofibrate 160 MG tablet Take 1 tablet (160 mg total) by mouth daily.  ? furosemide (LASIX) 40 MG tablet Take 1 tablet (40 mg total) by mouth every morning AND 0.5 tablets (20 mg total) every evening.  ? icosapent Ethyl (VASCEPA) 1 g capsule Take 2 capsules (2 g total) by mouth 2 (two) times daily.  ? insulin glargine (LANTUS SOLOSTAR) 100 UNIT/ML Solostar Pen Inject 26 Units into the skin daily.  ? Insulin Pen Needle (PEN NEEDLES 3/16") 31G X 5 MM MISC Lantus 23  ? isosorbide-hydrALAZINE (BIDIL) 20-37.5 MG tablet Take 1 tablet by mouth 3 (three) times daily. NEEDS FOLLOW UP APPOINTMENT FOR MORE REFILLS ALSO NEEDS TO KEEP FOLLOW UP APPOINTMENT  ? metFORMIN (GLUCOPHAGE-XR) 500 MG 24 hr tablet Take 1 tablet (500 mg  total) by mouth 2 (two) times daily.  ? nitroGLYCERIN (NITROSTAT) 0.4 MG SL tablet Place 1 tablet (0.4 mg total) under the tongue every 5 (five) minutes as needed for chest pain.  ? potassium chloride SA (KLOR-CO

## 2021-12-28 NOTE — Patient Instructions (Signed)
Medication Instructions:  Your physician recommends that you continue on your current medications as directed. Please refer to the Current Medication list given to you today.  *If you need a refill on your cardiac medications before your next appointment, please call your pharmacy*   Follow-Up: At [facility], you and your health needs are our priority.  As part of our continuing mission to provide you with exceptional heart care, we have created designated Provider Care Teams.  These Care Teams include your primary Cardiologist (physician) and Advanced Practice Providers (APPs -  Physician Assistants and Nurse Practitioners) who all work together to provide you with the care you need, when you need it.  Your next appointment:   3 month(s)  The format for your next appointment:   In Person  Provider:   [name] Turner, MD  Important Information About Sugar       

## 2021-12-28 NOTE — Telephone Encounter (Signed)
?  Patient Consent for Virtual Visit  ? ? ?   ? ?Peter Russo has provided verbal consent on 12/28/2021 for a virtual visit (video or telephone). ? ? ?CONSENT FOR VIRTUAL VISIT FOR:  Peter Russo  ?By participating in this virtual visit I agree to the following: ? ?I hereby voluntarily request, consent and authorize [facility] and its employed or contracted physicians, physician assistants, nurse practitioners or other licensed health care professionals (the Practitioner), to provide me with telemedicine health care services (the ?Services") as deemed necessary by the treating Practitioner. I acknowledge and consent to receive the Services by the Practitioner via telemedicine. I understand that the telemedicine visit will involve communicating with the Practitioner through live audiovisual communication technology and the disclosure of certain medical information by electronic transmission. I acknowledge that I have been given the opportunity to request an in-person assessment or other available alternative prior to the telemedicine visit and am voluntarily participating in the telemedicine visit. ? ?I understand that I have the right to withhold or withdraw my consent to the use of telemedicine in the course of my care at any time, without affecting my right to future care or treatment, and that the Practitioner or I may terminate the telemedicine visit at any time. I understand that I have the right to inspect all information obtained and/or recorded in the course of the telemedicine visit and may receive copies of available information for a reasonable fee.  I understand that some of the potential risks of receiving the Services via telemedicine include:  ?Delay or interruption in medical evaluation due to technological equipment failure or disruption; ?Information transmitted may not be sufficient (e.g. poor resolution of images) to allow for appropriate medical decision making by the Practitioner;  and/or  ?In rare instances, security protocols could fail, causing a breach of personal health information. ? ?Furthermore, I acknowledge that it is my responsibility to provide information about my medical history, conditions and care that is complete and accurate to the best of my ability. I acknowledge that Practitioner's advice, recommendations, and/or decision may be based on factors not within their control, such as incomplete or inaccurate data provided by me or distortions of diagnostic images or specimens that may result from electronic transmissions. I understand that the practice of medicine is not an exact science and that Practitioner makes no warranties or guarantees regarding treatment outcomes. I acknowledge that a copy of this consent can be made available to me via my patient portal [facility] MyChart), or I can request a printed copy by calling the office of [facility].   ? ?I understand that my insurance will be billed for this visit.  ? ?I have read or had this consent read to me. ?I understand the contents of this consent, which adequately explains the benefits and risks of the Services being provided via telemedicine.  ?I have been provided ample opportunity to ask questions regarding this consent and the Services and have had my questions answered to my satisfaction. ?I give my informed consent for the services to be provided through the use of telemedicine in my medical care ? ?  ?

## 2022-01-07 ENCOUNTER — Telehealth (HOSPITAL_COMMUNITY): Payer: Self-pay

## 2022-01-07 NOTE — Telephone Encounter (Signed)
Called to confirm/remind patient of their appointment at the [facility] on 01/11/22.   Patient reminded to bring all medications and/or complete list.  Confirmed patient has transportation. Gave directions, instructed to utilize valet parking.  Confirmed appointment prior to ending call.   

## 2022-01-11 ENCOUNTER — Ambulatory Visit (HOSPITAL_COMMUNITY)
Admission: RE | Admit: 2022-01-11 | Discharge: 2022-01-11 | Disposition: A | Discharge status: Home / Self Care | Payer: Medicare Other | Source: Ambulatory Visit | Attending: Family Medicine | Admitting: Family Medicine

## 2022-01-11 ENCOUNTER — Encounter (HOSPITAL_COMMUNITY): Payer: Self-pay

## 2022-01-11 VITALS — BP 132/82 | HR 87 | Wt 248.8 lb

## 2022-01-11 DIAGNOSIS — Z79899 Other long term (current) drug therapy: Secondary | ICD-10-CM | POA: Diagnosis not present

## 2022-01-11 DIAGNOSIS — E119 Type 2 diabetes mellitus without complications: Secondary | ICD-10-CM | POA: Diagnosis not present

## 2022-01-11 DIAGNOSIS — Z6838 Body mass index (BMI) 38.0-38.9, adult: Secondary | ICD-10-CM | POA: Diagnosis not present

## 2022-01-11 DIAGNOSIS — Z8249 Family history of ischemic heart disease and other diseases of the circulatory system: Secondary | ICD-10-CM | POA: Insufficient documentation

## 2022-01-11 DIAGNOSIS — Z794 Long term (current) use of insulin: Secondary | ICD-10-CM | POA: Diagnosis not present

## 2022-01-11 DIAGNOSIS — I5022 Chronic systolic (congestive) heart failure: Secondary | ICD-10-CM

## 2022-01-11 DIAGNOSIS — I110 Hypertensive heart disease with heart failure: Secondary | ICD-10-CM | POA: Insufficient documentation

## 2022-01-11 DIAGNOSIS — I25119 Atherosclerotic heart disease of native coronary artery with unspecified angina pectoris: Secondary | ICD-10-CM | POA: Diagnosis not present

## 2022-01-11 DIAGNOSIS — Z87891 Personal history of nicotine dependence: Secondary | ICD-10-CM | POA: Diagnosis not present

## 2022-01-11 DIAGNOSIS — E782 Mixed hyperlipidemia: Secondary | ICD-10-CM | POA: Diagnosis not present

## 2022-01-11 DIAGNOSIS — I255 Ischemic cardiomyopathy: Secondary | ICD-10-CM | POA: Diagnosis not present

## 2022-01-11 DIAGNOSIS — E669 Obesity, unspecified: Secondary | ICD-10-CM | POA: Diagnosis not present

## 2022-01-11 DIAGNOSIS — I2510 Atherosclerotic heart disease of native coronary artery without angina pectoris: Secondary | ICD-10-CM | POA: Diagnosis not present

## 2022-01-11 DIAGNOSIS — E118 Type 2 diabetes mellitus with unspecified complications: Secondary | ICD-10-CM | POA: Diagnosis not present

## 2022-01-11 DIAGNOSIS — I272 Pulmonary hypertension, unspecified: Secondary | ICD-10-CM | POA: Diagnosis not present

## 2022-01-11 DIAGNOSIS — G4733 Obstructive sleep apnea (adult) (pediatric): Secondary | ICD-10-CM | POA: Diagnosis not present

## 2022-01-11 DIAGNOSIS — E785 Hyperlipidemia, unspecified: Secondary | ICD-10-CM | POA: Insufficient documentation

## 2022-01-11 LAB — COMPREHENSIVE METABOLIC PANEL
ALT: 21 U/L (ref 0–44)
AST: 19 U/L (ref 15–41)
Albumin: 4.2 g/dL (ref 3.5–5.0)
Alkaline Phosphatase: 61 U/L (ref 38–126)
Anion gap: 11 (ref 5–15)
BUN: 15 mg/dL (ref 6–20)
CO2: 26 mmol/L (ref 22–32)
Calcium: 9.5 mg/dL (ref 8.9–10.3)
Chloride: 106 mmol/L (ref 98–111)
Creatinine, Ser: 1.37 mg/dL — ABNORMAL HIGH (ref 0.61–1.24)
GFR, Estimated: >60 mL/min (ref >60)
Glucose, Bld: 105 mg/dL — ABNORMAL HIGH (ref 70–99)
Potassium: 4.2 mmol/L (ref 3.5–5.1)
Sodium: 143 mmol/L (ref 135–145)
Total Bilirubin: 0.5 mg/dL (ref 0.3–1.2)
Total Protein: 7.7 g/dL (ref 6.5–8.1)

## 2022-01-11 LAB — BRAIN NATRIURETIC PEPTIDE: B Natriuretic Peptide: 132.3 pg/mL — ABNORMAL HIGH (ref 0.0–100.0)

## 2022-01-11 LAB — LIPID PANEL
Cholesterol: 107 mg/dL (ref 0–200)
HDL: 45 mg/dL (ref >40)
LDL Cholesterol: 43 mg/dL (ref 0–99)
Total CHOL/HDL Ratio: 2.4 RATIO
Triglycerides: 96 mg/dL (ref <150)
VLDL: 19 mg/dL (ref 0–40)

## 2022-01-11 LAB — DIGOXIN LEVEL: Digoxin Level: <0.2 ng/mL — ABNORMAL LOW (ref 0.8–2.0)

## 2022-01-11 MED ORDER — POTASSIUM CHLORIDE CRYS ER 20 MEQ PO TBCR: 40.0000 meq | EXTENDED_RELEASE_TABLET | Freq: Every day | ORAL | 3 refills | Status: AC

## 2022-01-11 MED ORDER — ENTRESTO 97-103 MG PO TABS: 1.0000 | ORAL_TABLET | Freq: Two times a day (BID) | ORAL | 11 refills | Status: AC

## 2022-01-11 MED ORDER — ISOSORB DINITRATE-HYDRALAZINE 20-37.5 MG PO TABS: 1.0000 | ORAL_TABLET | Freq: Three times a day (TID) | ORAL | 1 refills | Status: DC

## 2022-01-11 MED ORDER — DAPAGLIFLOZIN PROPANEDIOL 10 MG PO TABS: 10.0000 mg | ORAL_TABLET | Freq: Every day | ORAL | 3 refills | Status: DC

## 2022-01-11 MED ORDER — FUROSEMIDE 40 MG PO TABS: ORAL_TABLET | ORAL | 3 refills | Status: DC

## 2022-01-11 MED ORDER — CARVEDILOL 12.5 MG PO TABS: 12.5000 mg | ORAL_TABLET | Freq: Two times a day (BID) | ORAL | 3 refills | Status: DC

## 2022-01-11 MED ORDER — SPIRONOLACTONE 25 MG PO TABS: 25.0000 mg | ORAL_TABLET | Freq: Every day | ORAL | 3 refills | Status: AC

## 2022-01-11 MED ORDER — ATORVASTATIN CALCIUM 80 MG PO TABS: ORAL_TABLET | ORAL | 3 refills | Status: DC

## 2022-01-11 MED ORDER — FENOFIBRATE 160 MG PO TABS: 160.0000 mg | ORAL_TABLET | Freq: Every day | ORAL | 3 refills | Status: AC

## 2022-01-11 MED ORDER — DIGOXIN 125 MCG PO TABS: 125.0000 ug | ORAL_TABLET | Freq: Every day | ORAL | 3 refills | Status: DC

## 2022-01-11 NOTE — Patient Instructions (Signed)
It was great to see you today! No medication changes are needed at this time.  Labs today We will only contact you if something comes back abnormal or we need to make some changes. Otherwise no news is good news!  Your physician has recommended that you have a cardiopulmonary stress test (CPX). CPX testing is a non-invasive measurement of heart and lung function. It replaces a traditional treadmill stress test. This type of test provides a tremendous amount of information that relates not only to your present condition but also for future outcomes. This test combines measurements of you ventilation, respiratory gas exchange in the lungs, electrocardiogram (EKG), blood pressure and physical response before, during, and following an exercise protocol.  Your physician recommends that you schedule a follow-up appointment in: 6-8 weeks with Dr Shirlee Latch  Do the following things EVERYDAY: Weigh yourself in the morning before breakfast. Write it down and keep it in a log. Take your medicines as prescribed Eat low salt foods--Limit salt (sodium) to 2000 mg per day.  Stay as active as you can everyday Limit all fluids for the day to less than 2 liters  At the [facility], you and your health needs are our priority. As part of our continuing mission to provide you with exceptional heart care, we have created designated Provider Care Teams. These Care Teams include your primary Cardiologist (physician) and Advanced Practice Providers (APPs- Physician Assistants and Nurse Practitioners) who all work together to provide you with the care you need, when you need it.   You may see any of the following providers on your designated Care Team at your next follow up: Dr Arvilla Meres Dr Carron Curie, NP Robbie Lis, [place] [facility] [place], [place] Karle Plumber, PharmD   Please be sure to bring in all your medications bottles to every appointment.

## 2022-01-11 NOTE — Progress Notes (Signed)
PCP: Maximiano Coss, NP Cardiology: Dr. Johnsie Cancel HF Cardiology: Dr. Aundra Dubin  46 y.o. with history of CAD s/p multiple MIs and PCIs, ischemic cardiomyopathy, HTN, active smoking, and diabetes.  Patient has had multiple coronary interventions, fist in 2010.  His RCA is now chronically occluded, and he has had many stents in the LAD.  Echo in 1/18 showed EF 40%.  EF was down to 20-25% in 12/19.  Echo in 7/21 was reported as EF 25-30%, moderate LV enlargement, normal RV.  Of note, patient seen in urgent care in 7/21 and noted to have blood glucose > 400.  HgbA1c was 11.3. He was started on metformin at urgent care with new diagnosis of diabetes.     Given more frequent chest pain and dyspnea with moderate exertion, I took him for LHC/RHC in 10/21.  This showed low output HF with CI 1.48.  His LAD was totally occluded (long segment) with no collaterals, the RCA was totally occluded with R-R collaterals, and the LCx was patent.  He was admitted and started on milrinone, diuresed.  He was weaned off milrinone.  Lifevest was recommended but he refused.  He was seen by cardiac surgery, not a candidate for CABG.  We began LVAD evaluation (active smoker, not transplant candidate).  Echo in 10/21 showed EF 20-25% with severe LV dilation.  CPX (10/21) showed peak VO2 16, VE/VCO2 slope 37, RER 1.13 => moderate HF limitation.   Echo 8/22 EF 20-25%, moderate LV dilation, mildly decreased RV systolic function.   Today he returns for HF follow up. Last seen 8/22. Overall feeling fine. He has some dyspnea with increased physical activity. No dyspnea walking up steps. Denies palpitations, CP, dizziness, edema, or PND/Orthopnea. Appetite ok. No fever or chills. Does not weigh at home. Taking all medications. No longer smoking cigarettes (x 9 months), compliant with CPAP. Brother died of MI several months ago. He does not work.  Labs (7/21): K 5, creatinine 1.11, glucose 464, hgbA1 11.3 Labs (9/21): LDL 175 Labs (10/21): K  3.9, creatinine 1.0, hgbA1c 12.2 => 11.5 Labs (11/21): K 3.7, creatinine 1.05 Labs (12/21): K 4, creatinine 1.22 Labs (1/22): digoxin 0.2, hgbA1c 6.5, K 4.1, creatinine 1.16 Labs (3/22): LDL 16, TGs 671 Labs (6/22): LDL 51, TGs 161, HDL 47 Labs (8/22): K 4.2, creatinine 1.4  ECG (personally reviewed): NSR, PVC  & LVH 77 bpm  PMH: 1. CAD:  - BMS to LAD in 2010.  - DES to RCA on 2012 - DES to LCx and LAD in 9/62, complicated by dissection with overlapping DES to mid LAD.  - Anterior STEMI in 1/17 with DES to totally occluded LAD (stent thrombosis).  - 8/17 PCI to apical LAD, RCA noted to be totally occluded with collaterals.  - 3/18 anterior STEMI with PTCA to totally occluded LAD.  - Coronary angiography (10/21): LAD was totally occluded (long segment) with no collaterals, the RCA was totally occluded with R-R collaterals, and the LCx was patent 2. Chronic systolic CHF: Ischemic cardiomyopathy.   - Echo (1/18): EF 40%.  - Echo (12/19): EF 20-25% - Echo (7/21): EF 25-30%, moderate LV enlargement, mild LVH, RV normal.  - RHC (10/21): mean RA 6, PA 47/13, mean PCWP 20, CI 1.48, PVR 2.4 WU - CPX (10/21): peak VO2 16, VE/VCO2 slope 37, RER 1.13 => moderate HF limitation - Echo (8/22): EF 20-25%, moderate LV dilation, mildly decreased RV systolic function 3. HTN 4. H/o AVNRT 5. Smoking: quit 2022. - PFTs in 10/21 with  minimal obstructive disease.  6. OSA: Severe 7. Type 2 diabetes: Diagnosed in 7/21 8. Carotid dopplers (10/21): 1-39% BICA stenosis.  9. PAD: ABIs (10/21) were mildly decreased.  10. Hyperlipidemia  Social History   Socioeconomic History   Marital status: Significant Other    Spouse name: Not on file   Number of children: 3   Years of education: Not on file   Highest education level: Not on file  Occupational History   Occupation: Aeronautical engineer: [employer]  Tobacco Use   Smoking status: Former    Packs/day: 1.00    Years: 24.00    Pack years:  24.00    Types: Cigarettes    Quit date: 11/03/2016    Years since quitting: 5.1   Smokeless tobacco: Never   Tobacco comments:    quit on 03/15/16  Vaping Use   Vaping Use: Never used  Substance and Sexual Activity   Alcohol use: No   Drug use: Yes    Types: Marijuana    Comment: last time 02/2016   Sexual activity: Yes  Other Topics Concern   Not on file  Social History Narrative   Lives in [place] by himself.  Sometimes stays with girlfriend in [place].  Does not routinely exercise.   Social Determinants of Health   Financial Resource Strain: Not on file  Food Insecurity: Not on file  Transportation Needs: Not on file  Physical Activity: Not on file  Stress: Not on file  Social Connections: Not on file  Intimate Partner Violence: Not on file   Family History  Problem Relation Age of Onset   Arrhythmia Mother        MOTHER LIVED TO BE 102   Coronary artery disease Mother    Heart attack Mother    Hypertension Mother    Coronary artery disease Father 90   Heart disease Father    Heart attack Father    Heart attack Brother    Stroke Neg Hx    ROS: All systems reviewed and negative except as per HPI.   Current Outpatient Medications  Medication Sig Dispense Refill   acetaminophen (TYLENOL) 325 MG tablet Take 2 tablets (650 mg total) by mouth every 4 (four) hours as needed for headache or mild pain.     aspirin 81 MG EC tablet Take 1 tablet (81 mg total) by mouth daily. 90 tablet 3   atorvastatin (LIPITOR) 80 MG tablet TAKE 1 TABLET BY MOUTH EVERY DAY AT 6 PM 90 tablet 3   blood glucose meter kit and supplies Dispense based on patient and insurance preference. Use up to four times daily as directed. (FOR ICD-10 E10.9, E11.9). 1 each 0   buPROPion (WELLBUTRIN XL) 150 MG 24 hr tablet TAKE 1 TABLET BY MOUTH EVERY DAY 90 tablet 3   carvedilol (COREG) 12.5 MG tablet Take 1 tablet (12.5 mg total) by mouth 2 (two) times daily. 180 tablet 3   clotrimazole-betamethasone  (LOTRISONE) cream Apply 1 application topically as needed.     dapagliflozin propanediol (FARXIGA) 10 MG TABS tablet Take 1 tablet (10 mg total) by mouth daily before breakfast. 90 tablet 3   digoxin (LANOXIN) 0.125 MG tablet TAKE 1 TABLET (0.125 MG TOTAL) BY MOUTH DAILY 90 tablet 3   Evolocumab (REPATHA SURECLICK) 979 MG/ML SOAJ INJECT 1 PEN INTO THE SKIN EVERY 14 (FOURTEEN) DAYS. 2 mL 11   fenofibrate 160 MG tablet Take 1 tablet (160 mg total) by mouth daily. 90 tablet 3  furosemide (LASIX) 40 MG tablet Take 1 tablet (40 mg total) by mouth every morning AND 0.5 tablets (20 mg total) every evening. 135 tablet 3   icosapent Ethyl (VASCEPA) 1 g capsule Take 2 capsules (2 g total) by mouth 2 (two) times daily. 120 capsule 11   insulin glargine (LANTUS SOLOSTAR) 100 UNIT/ML Solostar Pen Inject 26 Units into the skin daily. 30 mL 4   Insulin Pen Needle (PEN NEEDLES 3/16") 31G X 5 MM MISC Lantus 23 100 each 1   isosorbide-hydrALAZINE (BIDIL) 20-37.5 MG tablet Take 1 tablet by mouth 3 (three) times daily. NEEDS FOLLOW UP APPOINTMENT FOR MORE REFILLS ALSO NEEDS TO KEEP FOLLOW UP APPOINTMENT 90 tablet 1   metFORMIN (GLUCOPHAGE-XR) 500 MG 24 hr tablet Take 1 tablet (500 mg total) by mouth 2 (two) times daily. 180 tablet 3   nitroGLYCERIN (NITROSTAT) 0.4 MG SL tablet Place 1 tablet (0.4 mg total) under the tongue every 5 (five) minutes as needed for chest pain. 25 tablet 3   potassium chloride SA (KLOR-CON) 20 MEQ tablet Take 2 tablets (40 mEq total) by mouth daily. Please make yearly appt with Dr. Johnsie Cancel for January 2022 for future refills. 1st attempt 180 tablet 0   sacubitril-valsartan (ENTRESTO) 97-103 MG Take 1 tablet by mouth 2 (two) times daily. 60 tablet 11   Semaglutide, 2 MG/DOSE, (OZEMPIC, 2 MG/DOSE,) 8 MG/3ML SOPN Inject 2 mg into the skin once a week. 3 mL 11   spironolactone (ALDACTONE) 25 MG tablet TAKE 1 TABLET BY MOUTH EVERY DAY 90 tablet 3   triamcinolone cream (KENALOG) 0.1 % Apply 1  application topically as needed.     No current facility-administered medications for this encounter.   Wt Readings from Last 3 Encounters:  01/11/22 112.9 kg (248 lb 12.8 oz)  12/28/21 114.3 kg (252 lb)  11/02/21 115.8 kg (255 lb 3.2 oz)   BP 132/82   Pulse 87   Wt 112.9 kg (248 lb 12.8 oz)   SpO2 97%   BMI 38.97 kg/m  General:  NAD. No resp difficulty HEENT: Normal Neck: Supple. No JVD. Carotids 2+ bilat; no bruits. No lymphadenopathy or thryomegaly appreciated. Cor: PMI nondisplaced. Regular rate & rhythm. No rubs, gallops or murmurs. Lungs: Clear Abdomen: Soft, nontender, nondistended. No hepatosplenomegaly. No bruits or masses. Good bowel sounds. Extremities: No cyanosis, clubbing, rash, edema Neuro: Alert & oriented x 3, cranial nerves grossly intact. Moves all 4 extremities w/o difficulty. Tearful.  Assessment/Plan: 1. CAD: RCA known to be chronically occluded.  He has had multiple PCIs to LAD.  Repeat LHC in 10/21 done for recurrent angina demonstrated occluded RCA (known from past) and occluded pLAD with no collaterals. His LCx was patent. I do not think we have a revascularization option, multiple overlapping LAD stents, now occluded and there are no good surgical targets. No chest pain.   - Continue ASA 81  - Continue atorvastatin 80 mg daily. 2. Chronic systolic CHF: Ischemic cardiomyopathy.  Echo in 10/21 with EF 20-25%, severe LV dilation, mildly decreased RV systolic function.  RHC in 10/21 demonstrated elevated PCWP, mild pulmonary venous hypertension and low cardiac output with CI 1.4. Patient was transiently on milrinone but weaned off. CPX in 10/21 showed peak VO2 16, VE/VCO2 slope 37, RER 1.13 => moderate HF limitation.  Echo 8/22 showed EF 20-25%.  Symptomatically, he has been NYHA class II. He is not volume overloaded on exam. - Continue Lasix 40 qam/20 qpm, BMET/BNP today.  - Continue Entresto 97/103 bid.  -  Continue carvedilol 12.5 mg bid.  - Continue  spironolactone 25 daily  - Continue digoxin 0.125 daily, check level.   - Continue Farxiga 10 mg daily.    - Continue Bidil 1 tab tid.  - Arrange for CPX.  - He has had LVAD workup in the past.  He had low CO on RHC though symptomatically does not seem that severe, CPX matches symptoms more with moderate HF limitation.  With symptomatic improvement, we have held off on advanced therapies for now.  Transplant would be a better option than LVAD if something is needed given his age, but he will need to get his BMI down further and keep glucose under control (also improved recently).   He has quit smoking for > 9 months.  - He is not a CRT candidate. We recommended Lifevest but he refused.  He has seen Dr. Caryl Comes regarding ICD; it was recommended again that he go ahead and have ICD placed. He is not sure he wants an ICD and would like more time to think about it. 3. Type 2 diabetes: New diagnosis in 7/21 with hgbA1c 11.3.  Most recently improved, HgbA1c 6.4 (12/22).  - Continue Farxiga 10 mg daily.  4. OSA: Severe OSA, compliant with CPAP. Follows with Dr. Radford Pax. 5. Smoking:  He quit in 10/21.  PFTs with minimal obstructive disease.  - Continue Wellbutrin 6. Hyperlipidemia: LDL is at goal on Repatha and atorvastatin.  TGs high when last checked, now on Vascepa and fenofibrate.  Followed by [facility]. Check LFTs and lipids today.  7. Obesity: Body mass index is 38.97 kg/m. Need to get weight down.  This needs better management before heart transplant could be considered.  - Continue Ozempic. Down almost 20 lbs so far. Congratulated. - Continue efforts at diet and exercise.   Long discussion today about next steps. He is tearful as his brother just passed from heart disease, he does not want to be next.  Follow up with Dr. Aundra Dubin in 6-8 weeks to discuss CPX results.    [place] FNP 01/11/2022

## 2022-01-21 ENCOUNTER — Ambulatory Visit: Payer: Medicare Other | Admitting: Internal Medicine

## 2022-01-22 DIAGNOSIS — G4733 Obstructive sleep apnea (adult) (pediatric): Secondary | ICD-10-CM | POA: Diagnosis not present

## 2022-02-17 ENCOUNTER — Other Ambulatory Visit (HOSPITAL_COMMUNITY): Payer: Self-pay | Admitting: *Deleted

## 2022-02-17 ENCOUNTER — Ambulatory Visit (HOSPITAL_COMMUNITY): Payer: Medicare Other | Attending: Family Medicine

## 2022-02-17 ENCOUNTER — Encounter (HOSPITAL_COMMUNITY): Payer: Self-pay | Admitting: *Deleted

## 2022-02-17 DIAGNOSIS — I5022 Chronic systolic (congestive) heart failure: Secondary | ICD-10-CM

## 2022-02-17 DIAGNOSIS — E669 Obesity, unspecified: Secondary | ICD-10-CM | POA: Diagnosis not present

## 2022-02-21 DIAGNOSIS — G4733 Obstructive sleep apnea (adult) (pediatric): Secondary | ICD-10-CM | POA: Diagnosis not present

## 2022-02-22 ENCOUNTER — Telehealth (HOSPITAL_COMMUNITY): Payer: Self-pay | Admitting: Licensed Clinical Social Worker

## 2022-02-22 NOTE — Progress Notes (Signed)
  Heart and Vascular Care Navigation  02/22/2022  Peter Russo 11-May-1976 [id number]  Reason for Referral: CSW consulted to speak with pt about current mental health concerns after he became emotional during CPX testing.   Engaged with patient by telephone for initial visit for Heart and Vascular Care Coordination.                                                                                                   Assessment:   CSW spoke with pt about above concerns.  Admits that he is struggling currently with depression about his health condition (anxiety surrounding getting worse or dying).  States he has struggled with feelings of depression on and off for years but that he has never spoken with mental health professional.  Is currently on medication for depression but doesn't feel as if this helps.  Pt states he copes with these feelings by avoiding them and trying to focus on other things.  States sometimes he gets very angry and takes things out on his significant other. CSW discussed how not processing negative emotions and thoughts can come out in harmful manners such as anger and encouraged him to consider counseling.  Pt is agreeable to being referred to counseling with Georgiann Mohs- referral placed and pt set for appt on July 27th.                                     HRT/VAS Care Coordination     Home Assistive Devices/Equipment None       Social History:                                                                             SDOH Screenings   Alcohol Screen: Not on file  Depression (PHQ2-9): Low Risk  (06/02/2020)   Depression (PHQ2-9)    PHQ-2 Score: 0  Financial Resource Strain: Not on file  Food Insecurity: Not on file  Housing: Not on file  Physical Activity: Not on file  Social Connections: Not on file  Stress: Not on file  Tobacco Use: Medium Risk (02/17/2022)   Patient History    Smoking Tobacco Use: Former    Smokeless Tobacco Use: Never    Passive  Exposure: Not on file  Transportation Needs: Not on file      Follow-up plan:    Pt to begin counseling.  Will reach out to CSW if he is in crisis and needs to speak with someone more urgently.  Will continue to follow and assist as needed  Burna Sis, LCSW Clinical Social Worker [facility] Desk#: [phone] Cell#: [phone]

## 2022-03-03 ENCOUNTER — Telehealth (HOSPITAL_COMMUNITY): Payer: Self-pay

## 2022-03-03 NOTE — Telephone Encounter (Signed)
Called to confirm/remind patient of their appointment at the [facility] on 03/11/22.   Patient reminded to bring all medications and/or complete list.  Confirmed patient has transportation. Gave directions, instructed to utilize valet parking.  Confirmed appointment prior to ending call.   

## 2022-03-10 ENCOUNTER — Ambulatory Visit: Payer: Medicare Other | Admitting: Psychologist

## 2022-03-11 ENCOUNTER — Encounter (HOSPITAL_COMMUNITY): Payer: Self-pay | Admitting: Cardiology

## 2022-03-11 ENCOUNTER — Ambulatory Visit (HOSPITAL_COMMUNITY)
Admission: RE | Admit: 2022-03-11 | Discharge: 2022-03-11 | Disposition: A | Discharge status: Home / Self Care | Payer: Medicare Other | Source: Ambulatory Visit | Attending: Cardiology | Admitting: Cardiology

## 2022-03-11 VITALS — BP 138/80 | HR 72 | Wt 236.8 lb

## 2022-03-11 DIAGNOSIS — Z6837 Body mass index (BMI) 37.0-37.9, adult: Secondary | ICD-10-CM | POA: Diagnosis not present

## 2022-03-11 DIAGNOSIS — I110 Hypertensive heart disease with heart failure: Secondary | ICD-10-CM | POA: Insufficient documentation

## 2022-03-11 DIAGNOSIS — G4733 Obstructive sleep apnea (adult) (pediatric): Secondary | ICD-10-CM | POA: Diagnosis not present

## 2022-03-11 DIAGNOSIS — E785 Hyperlipidemia, unspecified: Secondary | ICD-10-CM | POA: Insufficient documentation

## 2022-03-11 DIAGNOSIS — E669 Obesity, unspecified: Secondary | ICD-10-CM | POA: Diagnosis not present

## 2022-03-11 DIAGNOSIS — E119 Type 2 diabetes mellitus without complications: Secondary | ICD-10-CM | POA: Insufficient documentation

## 2022-03-11 DIAGNOSIS — Z87891 Personal history of nicotine dependence: Secondary | ICD-10-CM | POA: Diagnosis not present

## 2022-03-11 DIAGNOSIS — I272 Pulmonary hypertension, unspecified: Secondary | ICD-10-CM | POA: Insufficient documentation

## 2022-03-11 DIAGNOSIS — I25119 Atherosclerotic heart disease of native coronary artery with unspecified angina pectoris: Secondary | ICD-10-CM | POA: Diagnosis not present

## 2022-03-11 DIAGNOSIS — I255 Ischemic cardiomyopathy: Secondary | ICD-10-CM | POA: Insufficient documentation

## 2022-03-11 DIAGNOSIS — I5022 Chronic systolic (congestive) heart failure: Secondary | ICD-10-CM

## 2022-03-11 DIAGNOSIS — Z79899 Other long term (current) drug therapy: Secondary | ICD-10-CM | POA: Diagnosis not present

## 2022-03-11 LAB — BASIC METABOLIC PANEL
Anion gap: 7 (ref 5–15)
BUN: 17 mg/dL (ref 6–20)
CO2: 27 mmol/L (ref 22–32)
Calcium: 9.9 mg/dL (ref 8.9–10.3)
Chloride: 106 mmol/L (ref 98–111)
Creatinine, Ser: 1.66 mg/dL — ABNORMAL HIGH (ref 0.61–1.24)
GFR, Estimated: 51 mL/min — ABNORMAL LOW (ref >60)
Glucose, Bld: 94 mg/dL (ref 70–99)
Potassium: 4.1 mmol/L (ref 3.5–5.1)
Sodium: 140 mmol/L (ref 135–145)

## 2022-03-11 LAB — BRAIN NATRIURETIC PEPTIDE: B Natriuretic Peptide: 87.9 pg/mL (ref 0.0–100.0)

## 2022-03-11 MED ORDER — NITROGLYCERIN 0.4 MG SL SUBL: 0.4000 mg | SUBLINGUAL_TABLET | SUBLINGUAL | 3 refills | Status: AC | PRN

## 2022-03-11 MED ORDER — CARVEDILOL 12.5 MG PO TABS: 18.7500 mg | ORAL_TABLET | Freq: Two times a day (BID) | ORAL | 3 refills | Status: DC

## 2022-03-11 NOTE — Patient Instructions (Signed)
Increase Carvedilol to 18.75mg   (1 1/2 Tab)Twice daily  Labs done today, your results will be available in MyChart, we will contact you for abnormal readings.  Your physician has requested that you have an echocardiogram. Echocardiography is a painless test that uses sound waves to create images of your heart. It provides your doctor with information about the size and shape of your heart and how well your heart's chambers and valves are working. This procedure takes approximately one hour. There are no restrictions for this procedure.  Your physician recommends that you schedule a follow-up appointment as scheduled.  If you have any questions or concerns before your next appointment please send [place] a message through [place] or call our office at [phone].    TO LEAVE A MESSAGE FOR THE NURSE SELECT OPTION 2, PLEASE LEAVE A MESSAGE INCLUDING: YOUR NAME DATE OF BIRTH CALL BACK NUMBER REASON FOR CALL**this is important as we prioritize the call backs  YOU WILL RECEIVE A CALL BACK THE SAME DAY AS LONG AS YOU CALL BEFORE 4:00 PM  At the [facility], you and your health needs are our priority. As part of our continuing mission to provide you with exceptional heart care, we have created designated Provider Care Teams. These Care Teams include your primary Cardiologist (physician) and Advanced Practice Providers (APPs- Physician Assistants and Nurse Practitioners) who all work together to provide you with the care you need, when you need it.   You may see any of the following providers on your designated Care Team at your next follow up: Dr Arvilla Meres Dr Carron Curie, NP Robbie Lis, [place] [facility] [place], [place] Karle Plumber, PharmD   Please be sure to bring in all your medications bottles to every appointment.

## 2022-03-13 NOTE — Progress Notes (Signed)
PCP: Maximiano Coss, NP Cardiology: Dr. Johnsie Cancel HF Cardiology: Dr. Aundra Dubin  46 y.o. with history of CAD s/p multiple MIs and PCIs, ischemic cardiomyopathy, HTN, active smoking, and diabetes.  Patient has had multiple coronary interventions, fist in 2010.  His RCA is now chronically occluded, and he has had many stents in the LAD.  Echo in 1/18 showed EF 40%.  EF was down to 20-25% in 12/19.  Echo in 7/21 was reported as EF 25-30%, moderate LV enlargement, normal RV.  Of note, patient seen in urgent care in 7/21 and noted to have blood glucose > 400.  HgbA1c was 11.3. He was started on metformin at urgent care with new diagnosis of diabetes.     Given more frequent chest pain and dyspnea with moderate exertion, I took him for LHC/RHC in 10/21.  This showed low output HF with CI 1.48.  His LAD was totally occluded (long segment) with no collaterals, the RCA was totally occluded with R-R collaterals, and the LCx was patent.  He was admitted and started on milrinone, diuresed.  He was weaned off milrinone.  Lifevest was recommended but he refused.  He was seen by cardiac surgery, not a candidate for CABG.  We began LVAD evaluation (active smoker, not transplant candidate).  Echo in 10/21 showed EF 20-25% with severe LV dilation.  CPX (10/21) showed peak VO2 16, VE/VCO2 slope 37, RER 1.13 => moderate HF limitation.   Echo in 8/22 showed EF 20-25%, moderate LV dilation, mildly decreased RV systolic function. CPX in 7/23 showed moderate-severe functional limitation, somewhat worse than 10/21.   Patient returns for followup of CHF and CAD.  Weight is down 12 lbs.  He denies significant exertional dyspnea or chest pain.  No orthopnea/PND. No lightheadedness. He is not smoking.  So far, he has refused ICD.  He gets very anxious and tearful when discussing his condition.   Labs (7/21): K 5, creatinine 1.11, glucose 464, hgbA1 11.3 Labs (9/21): LDL 175 Labs (10/21): K 3.9, creatinine 1.0, hgbA1c 12.2 => 11.5 Labs  (11/21): K 3.7, creatinine 1.05 Labs (12/21): K 4, creatinine 1.22 Labs (1/22): digoxin 0.2, hgbA1c 6.5, K 4.1, creatinine 1.16 Labs (3/22): LDL 16, TGs 671 Labs (6/22): LDL 51, TGs 161, HDL 47 Labs (8/22): K 4.2, creatinine 1.4 Labs (5/23): K 4.2, creatinine 1.37, BNP 132, digoxin < 0.2, LDL 43, TGs 56  ECG (personally reviewed): NSR, IVCD 122 msec  PMH: 1. CAD:  - BMS to LAD in 2010.  - DES to RCA on 2012 - DES to LCx and LAD in 3/35, complicated by dissection with overlapping DES to mid LAD.  - Anterior STEMI in 1/17 with DES to totally occluded LAD (stent thrombosis).  - 8/17 PCI to apical LAD, RCA noted to be totally occluded with collaterals.  - 3/18 anterior STEMI with PTCA to totally occluded LAD.  - Coronary angiography (10/21): LAD was totally occluded (long segment) with no collaterals, the RCA was totally occluded with R-R collaterals, and the LCx was patent 2. Chronic systolic CHF: Ischemic cardiomyopathy.   - Echo (1/18): EF 40%.  - Echo (12/19): EF 20-25% - Echo (7/21): EF 25-30%, moderate LV enlargement, mild LVH, RV normal.  - RHC (10/21): mean RA 6, PA 47/13, mean PCWP 20, CI 1.48, PVR 2.4 WU - CPX (10/21): peak VO2 16, VE/VCO2 slope 37, RER 1.13 => moderate HF limitation - Echo (8/22): EF 20-25%, moderate LV dilation, mildly decreased RV systolic function - CPX (4/56): VO2 max 13.7,  VE/VOC2 slope 41, RER 1.10 => moderate to severe limitation due to body habitus and CHF.  3. HTN 4. H/o AVNRT 5. Smoking: Active smoker, 1-2 cigs/day.  - PFTs in 10/21 with minimal obstructive disease.  6. OSA: Severe 7. Type 2 diabetes: Diagnosed in 7/21 8. Carotid dopplers (10/21): 1-39% BICA stenosis.  9. PAD: ABIs (10/21) were mildly decreased.  10. Hyperlipidemia  Social History   Socioeconomic History   Marital status: Significant Other    Spouse name: Not on file   Number of children: 3   Years of education: Not on file   Highest education level: Not on file   Occupational History   Occupation: Aeronautical engineer: [employer]  Tobacco Use   Smoking status: Former    Packs/day: 1.00    Years: 24.00    Total pack years: 24.00    Types: Cigarettes    Quit date: 11/03/2016    Years since quitting: 5.3   Smokeless tobacco: Never   Tobacco comments:    quit on 03/15/16  Vaping Use   Vaping Use: Never used  Substance and Sexual Activity   Alcohol use: No   Drug use: Yes    Types: Marijuana    Comment: last time 02/2016   Sexual activity: Yes  Other Topics Concern   Not on file  Social History Narrative   Lives in [place] by himself.  Sometimes stays with girlfriend in [place].  Does not routinely exercise.   Social Determinants of Health   Financial Resource Strain: Not on file  Food Insecurity: Not on file  Transportation Needs: Not on file  Physical Activity: Not on file  Stress: Not on file  Social Connections: Not on file  Intimate Partner Violence: Not on file   Family History  Problem Relation Age of Onset   Arrhythmia Mother        MOTHER LIVED TO BE 89   Coronary artery disease Mother    Heart attack Mother    Hypertension Mother    Coronary artery disease Father 8   Heart disease Father    Heart attack Father    Heart attack Brother    Stroke Neg Hx    ROS: All systems reviewed and negative except as per HPI.   Current Outpatient Medications  Medication Sig Dispense Refill   acetaminophen (TYLENOL) 325 MG tablet Take 2 tablets (650 mg total) by mouth every 4 (four) hours as needed for headache or mild pain.     aspirin 81 MG EC tablet Take 1 tablet (81 mg total) by mouth daily. 90 tablet 3   atorvastatin (LIPITOR) 80 MG tablet TAKE 1 TABLET BY MOUTH EVERY DAY AT 6 PM 90 tablet 3   blood glucose meter kit and supplies Dispense based on patient and insurance preference. Use up to four times daily as directed. (FOR ICD-10 E10.9, E11.9). 1 each 0   buPROPion (WELLBUTRIN XL) 150 MG 24 hr tablet TAKE 1 TABLET BY  MOUTH EVERY DAY 90 tablet 3   clotrimazole-betamethasone (LOTRISONE) cream Apply 1 application topically as needed.     dapagliflozin propanediol (FARXIGA) 10 MG TABS tablet Take 1 tablet (10 mg total) by mouth daily before breakfast. 90 tablet 3   digoxin (LANOXIN) 0.125 MG tablet Take 1 tablet (125 mcg total) by mouth daily. 90 tablet 3   Evolocumab (REPATHA SURECLICK) 572 MG/ML SOAJ INJECT 1 PEN INTO THE SKIN EVERY 14 (FOURTEEN) DAYS. 2 mL 11   fenofibrate 160 MG  tablet Take 1 tablet (160 mg total) by mouth daily. 90 tablet 3   furosemide (LASIX) 40 MG tablet Take 1 tablet (40 mg total) by mouth every morning AND 0.5 tablets (20 mg total) every evening. 135 tablet 3   icosapent Ethyl (VASCEPA) 1 g capsule Take 2 capsules (2 g total) by mouth 2 (two) times daily. 120 capsule 11   insulin glargine (LANTUS SOLOSTAR) 100 UNIT/ML Solostar Pen Inject 26 Units into the skin daily. 30 mL 4   Insulin Pen Needle (PEN NEEDLES 3/16") 31G X 5 MM MISC Lantus 23 100 each 1   isosorbide-hydrALAZINE (BIDIL) 20-37.5 MG tablet Take 1 tablet by mouth 3 (three) times daily. 90 tablet 1   metFORMIN (GLUCOPHAGE-XR) 500 MG 24 hr tablet Take 1 tablet (500 mg total) by mouth 2 (two) times daily. 180 tablet 3   potassium chloride SA (KLOR-CON M) 20 MEQ tablet Take 2 tablets (40 mEq total) by mouth daily. 180 tablet 3   sacubitril-valsartan (ENTRESTO) 97-103 MG Take 1 tablet by mouth 2 (two) times daily. 60 tablet 11   Semaglutide, 2 MG/DOSE, (OZEMPIC, 2 MG/DOSE,) 8 MG/3ML SOPN Inject 2 mg into the skin once a week. 3 mL 11   spironolactone (ALDACTONE) 25 MG tablet Take 1 tablet (25 mg total) by mouth daily. 90 tablet 3   triamcinolone cream (KENALOG) 0.1 % Apply 1 application topically as needed.     carvedilol (COREG) 12.5 MG tablet Take 1.5 tablets (18.75 mg total) by mouth 2 (two) times daily. 180 tablet 3   nitroGLYCERIN (NITROSTAT) 0.4 MG SL tablet Place 1 tablet (0.4 mg total) under the tongue every 5 (five) minutes  as needed for chest pain. 25 tablet 3   No current facility-administered medications for this encounter.   BP 138/80   Pulse 72   Wt 107.4 kg (236 lb 12.8 oz)   SpO2 99%   BMI 37.09 kg/m  General: NAD Neck: No JVD, no thyromegaly or thyroid nodule.  Lungs: Clear to auscultation bilaterally with normal respiratory effort. CV: Nondisplaced PMI.  Heart regular S1/S2, no S3/S4, no murmur.  No peripheral edema.  No carotid bruit.  Normal pedal pulses.  Abdomen: Soft, nontender, no hepatosplenomegaly, no distention.  Skin: Intact without lesions or rashes.  Neurologic: Alert and oriented x 3.  Psych: Normal affect. Extremities: No clubbing or cyanosis.  HEENT: Normal.   Assessment/Plan: 1. CAD: RCA known to be chronically occluded.  He has had multiple PCIs to LAD.  Repeat LHC in 10/21 done for recurrent angina demonstrated occluded RCA (known from past) and occluded pLAD with no collaterals. His LCx was patent. I do not think we have a revascularization option, multiple overlapping LAD stents, now occluded and there are no good surgical targets. No recent chest pain.   - Continue ASA 81  - Continue Atorvastatin 80  2. Chronic systolic CHF: Ischemic cardiomyopathy.  Echo in 10/21 with EF 20-25%, severe LV dilation, mildly decreased RV systolic function.  RHC in 10/21 demonstrated elevated PCWP, mild pulmonary venous hypertension and low cardiac output with CI 1.4. Patient was transiently on milrinone but weaned off. CPX in 10/21 showed peak VO2 16, VE/VCO2 slope 37, RER 1.13 => moderate HF limitation.  Echo in 8/22 showed EF 20-25%.  CPX was worse in 7/23 with moderate-severe functional limitation.  Despite the CPX, he reports doing very well symptomatically with no significant exertional dyspnea.  He describes NYHA class I-II.  He is not volume overloaded on exam.  Weight  has been trending down with semaglutide use.  - Continue Lasix 40 qam/20 qpm.   BMET/BNP today.  - Continue Entresto 97/103  bid.  - Increase Coreg to 18.75 mg bid.  - Continue spironolactone 25 daily  - Continue digoxin 0.125 daily, check level.   - Continue Farxiga 10 mg daily.    - Continue Bidil 1 tab tid.  - He has had LVAD workup in the past.  He had low CO on RHC and in 7/23 had CPX showing moderate-severe functional limitation though symptomatically he says he is doing very well, describes NYHA class 1-II.  With stable symptoms, we have held off on advanced therapies for now.  Transplant would be a better option than LVAD if something is needed given his age, weight is improving and his diabetes is under better control. He has quit smoking for > 6 months.  - He is not a CRT candidate. He has seen Dr. Caryl Comes regarding ICD; I recommended again that he go ahead and have ICD placed, but he is nervous about this and still wants to think more about it (same as last appointment).  I will arrange repeat echo and then readdress ICD with him.  3. Type 2 diabetes: New diagnosis in 7/21 with hgbA1c 11.3.  Glucose now under better control. - Continue Farxiga 10 mg daily.  4. OSA: Severe OSA, uses CPAP.  5. Smoking:  He quit in 10/21.  PFTs with minimal obstructive disease.  - Continue Wellbutrin 6. Hyperlipidemia: LDL is at goal on Repatha and atorvastatin.  TGs high when last checked, now on Vascepa and fenofibrate.  Followed by lipid clinic. Good lipids in 5/23. 7. Obesity: Weight coming down on semaglutide.  - Continue efforts at diet and exercise.   Followup 2 months with APP.    Loralie Champagne 03/13/2022

## 2022-03-14 ENCOUNTER — Telehealth (HOSPITAL_COMMUNITY): Payer: Self-pay | Admitting: Surgery

## 2022-03-14 DIAGNOSIS — I5022 Chronic systolic (congestive) heart failure: Secondary | ICD-10-CM

## 2022-03-14 MED ORDER — FUROSEMIDE 40 MG PO TABS: 40.0000 mg | ORAL_TABLET | Freq: Every day | ORAL | 3 refills | Status: DC

## 2022-03-14 MED ORDER — FUROSEMIDE 40 MG PO TABS: 40.0000 mg | ORAL_TABLET | Freq: Every day | ORAL | 6 refills | Status: AC

## 2022-03-14 NOTE — Telephone Encounter (Signed)
Patient called to review results and recommendations per provider.  Medlist in [facility] updated and lab appt scheduled.

## 2022-03-14 NOTE — Telephone Encounter (Signed)
-----   Message from Laurey Morale, MD sent at 03/13/2022  9:18 PM EDT ----- Creatinine higher, decrease Lasix to 40 mg daily.  BMET 10 days.

## 2022-03-24 ENCOUNTER — Ambulatory Visit: Payer: Medicare Other | Admitting: Psychologist

## 2022-03-24 DIAGNOSIS — G4733 Obstructive sleep apnea (adult) (pediatric): Secondary | ICD-10-CM | POA: Diagnosis not present

## 2022-03-28 ENCOUNTER — Other Ambulatory Visit (HOSPITAL_COMMUNITY): Payer: Medicare Other

## 2022-04-01 ENCOUNTER — Ambulatory Visit: Payer: Medicare Other | Admitting: Physician Assistant

## 2022-04-04 ENCOUNTER — Ambulatory Visit (INDEPENDENT_AMBULATORY_CARE_PROVIDER_SITE_OTHER): Payer: Medicare Other | Admitting: Internal Medicine

## 2022-04-04 ENCOUNTER — Encounter: Payer: Self-pay | Admitting: Internal Medicine

## 2022-04-04 ENCOUNTER — Other Ambulatory Visit: Payer: Self-pay | Admitting: Internal Medicine

## 2022-04-04 ENCOUNTER — Ambulatory Visit (HOSPITAL_COMMUNITY)
Admission: RE | Admit: 2022-04-04 | Discharge: 2022-04-04 | Disposition: A | Discharge status: Home / Self Care | Payer: Medicare Other | Source: Ambulatory Visit | Attending: Internal Medicine | Admitting: Internal Medicine

## 2022-04-04 VITALS — BP 126/80 | HR 79 | Ht 67.0 in | Wt 239.0 lb

## 2022-04-04 DIAGNOSIS — E1159 Type 2 diabetes mellitus with other circulatory complications: Secondary | ICD-10-CM

## 2022-04-04 DIAGNOSIS — I5022 Chronic systolic (congestive) heart failure: Secondary | ICD-10-CM | POA: Diagnosis not present

## 2022-04-04 LAB — BASIC METABOLIC PANEL
Anion gap: 8 (ref 5–15)
BUN: 18 mg/dL (ref 6–20)
CO2: 25 mmol/L (ref 22–32)
Calcium: 9 mg/dL (ref 8.9–10.3)
Chloride: 108 mmol/L (ref 98–111)
Creatinine, Ser: 1.32 mg/dL — ABNORMAL HIGH (ref 0.61–1.24)
GFR, Estimated: >60 mL/min (ref >60)
Glucose, Bld: 110 mg/dL — ABNORMAL HIGH (ref 70–99)
Potassium: 3.8 mmol/L (ref 3.5–5.1)
Sodium: 141 mmol/L (ref 135–145)

## 2022-04-04 LAB — POCT GLYCOSYLATED HEMOGLOBIN (HGB A1C): Hemoglobin A1C: 5.7 % — AB (ref 4.0–5.6)

## 2022-04-04 LAB — POCT GLUCOSE (DEVICE FOR HOME USE): Glucose Fasting, POC: 106 mg/dL — AB (ref 70–99)

## 2022-04-04 MED ORDER — METFORMIN HCL ER 500 MG PO TB24: 500.0000 mg | ORAL_TABLET | Freq: Two times a day (BID) | ORAL | 3 refills | Status: DC

## 2022-04-04 MED ORDER — "PEN NEEDLES 3/16"" 31G X 5 MM MISC": 1.0000 | Freq: Every day | 3 refills | Status: DC

## 2022-04-04 MED ORDER — DAPAGLIFLOZIN PROPANEDIOL 10 MG PO TABS
10.0000 mg | ORAL_TABLET | Freq: Every day | ORAL | 3 refills | Status: DC
Start: 2022-04-04 — End: 2022-11-08

## 2022-04-04 MED ORDER — LANTUS SOLOSTAR 100 UNIT/ML ~~LOC~~ SOPN
20.0000 [IU] | PEN_INJECTOR | Freq: Every day | SUBCUTANEOUS | 6 refills | Status: DC
Start: 2022-04-04 — End: 2022-11-08

## 2022-04-04 MED ORDER — OZEMPIC (2 MG/DOSE) 8 MG/3ML ~~LOC~~ SOPN: 2.0000 mg | PEN_INJECTOR | SUBCUTANEOUS | 3 refills | Status: DC

## 2022-04-04 NOTE — Progress Notes (Signed)
Name: Peter Russo  Age/ Sex: 46 y.o., male   MRN/ DOB: [id number], 09-16-1975     PCP: Maximiano Coss, NP   Reason for Endocrinology Evaluation: Type 2 Diabetes Mellitus  Initial Endocrine Consultative Visit: 09/23/2020    PATIENT IDENTIFIER: Peter Russo is a 46 y.o. male with a past medical history of T2Dm, CAD ( S/P PCI), CHF, and dyslipidemia The patient has followed with Endocrinology clinic since 09/23/2020 for consultative assistance with management of his diabetes.  DIABETIC HISTORY:  Peter Russo was diagnosed with DM 02/2020. His hemoglobin A1c has ranged from 11.3% in7/2021, peaking at 12.2% in 2021.  ON his initial visit to our clinic he had an A1c of 6.8% , we switched Metformin to XR , continued Lantus and Wilder Glade    He was started on Ozempic 03/2021 by cardiology for weight loss in preparation  for heart transplant   SUBJECTIVE:   During the last visit (07/23/2021): A1c 6.4% Increased  Lantus and continued  [place] and metformin    Today (04/04/2022): Peter Russo is here for diabetes management.  He checks his blood sugars every other day . The patient has not had hypoglycemic episodes since the last clinic visit.   Had a follow up with cardiology 02/2022  Denies nausea, vomiting or diarrhea except for loose stools with metformin  He just returned from a funeral and is feeling down   Peter Russo:  Lantus 26 units daily Metformin 500 mg XR , 1 tablet twice daily Farxiga 10 mg, 1 tablet with Breakfast  Ozempic 2 mg weekly     Statin: yes ACE-I/ARB: yes    METER DOWNLOAD SUMMARY: Did not bring     DIABETIC COMPLICATIONS: Microvascular complications:   Denies: CKD, retinopathy, neuropathy  Last Eye Exam: Completed 09/2020  Macrovascular complications:  CAD ( S/P PCI) , CHF  Denies: CVA, PVD   HISTORY:  Past Medical History:  Past Medical History:  Diagnosis Date   Anxiety    CAD S/P percutaneous coronary angioplasty     a. s/p BMS-mLAD 2010. b. DES to RCA 2012. c. 04/2015: DES to LCx and PCI to LAD c/b dissection with subsequent overlapping DES to mLAD - r/i for NSTEMI afterwards; d. 08/2015 Ant STEMI in setting of noncompliance->182mAD ISR (3.0x16 Synergy DES); e. 03/2016 PCI of apical LAD; f. 03/2016 Relook Cath: patent LAD stents, RCA 100p CTO-->Med Rx; g. 10/2016 Ant STEMI: PTCA 100p LAD.   Chronic combined systolic and diastolic CHF (congestive heart failure) ([place]    a. 03/2016 Echo: EF 30-35%, Gr2 DD;  b. 08/2016 Echo: EF 40%.   Depression    Essential hypertension    GAD (generalized anxiety disorder)    GERD (gastroesophageal reflux disease)    Hypercholesteremia    Ischemic cardiomyopathy    a. prior EF 25-35 percent at cath, 35-40 percent by echo; b. 03/2016 Echo: EF 30-35%, diff HK, Gr2 DD; c. 08/2016 Echo: EF 40%, base/mid inferior/inferowseptal AK, mildly dil LA.   Morbid obesity ([place]    Pre-diabetes    SVT ([place]ventricular tachycardia) ([place] [facility] 10 YRS AGO   PRESUMED AVNRT, ECHO 12/07 WITH EF 65%, MILD LAE   Tobacco abuse    a. 20 + pack years, quit 10/2016.   Past Surgical History:  Past Surgical History:  Procedure Laterality Date   CARDIAC CATHETERIZATION N/A 04/23/2015   Procedure: Left Heart Cath and Coronary Angiography;  Surgeon: PJosue Hector MD;  [facility];  Service: Cardiovascular;  Laterality: N/A;   CARDIAC CATHETERIZATION N/A 04/23/2015   Procedure: Coronary Stent Intervention;  Surgeon: Sherren Mocha, MD;  Location: [facility];  Service: Cardiovascular;  Laterality: N/A;   CARDIAC CATHETERIZATION N/A 09/14/2015   Procedure: Left Heart Cath and Coronary Angiography;  Surgeon: Belva Crome, MD;  LAD 100%, CFX 10% ISR, RCA 100% (chronic), EF 25-35%   CARDIAC CATHETERIZATION N/A 09/14/2015   Procedure: Coronary Stent Intervention;  Surgeon: Belva Crome, MD;  Location: [facility];  Service: Cardiovascular;  Laterality: N/A; 3.0 x 16 mm Synergy DES  LAD   CARDIAC CATHETERIZATION  03/16/2016   CARDIAC CATHETERIZATION N/A 03/16/2016   Procedure: Left Heart Cath and Coronary Angiography;  Surgeon: Belva Crome, MD;  Location: [facility] [place] [facility];  Service: Cardiovascular;  Laterality: N/A;   CARDIAC CATHETERIZATION N/A 03/16/2016   Procedure: Coronary Balloon Angioplasty;  Surgeon: Belva Crome, MD;  Location: [facility];  Service: Cardiovascular;  Laterality: N/A;   CARDIAC CATHETERIZATION N/A 03/25/2016   Procedure: Left Heart Cath and Coronary Angiography;  Surgeon: Peter M Martinique, MD;  Location: [facility];  Service: Cardiovascular;  Laterality: N/A;   CORONARY ANGIOPLASTY WITH STENT PLACEMENT  2010; 2013   CORONARY BALLOON ANGIOPLASTY N/A 10/19/2016   Procedure: Coronary Balloon Angioplasty;  Surgeon: Jettie Booze, MD;  Location: [place] [facility];  Service: Cardiovascular;  Laterality: N/A;   INTRAVASCULAR ULTRASOUND/IVUS N/A 10/19/2016   Procedure: Intravascular Ultrasound/IVUS;  Surgeon: Jettie Booze, MD;  Location: [facility];  Service: Cardiovascular;  Laterality: N/A;   LEFT HEART CATH AND CORONARY ANGIOGRAPHY N/A 10/19/2016   Procedure: Left Heart Cath and Coronary Angiography;  Surgeon: Jettie Booze, MD;  Location: [facility];  Service: Cardiovascular;  Laterality: N/A;   RIGHT/LEFT HEART CATH AND CORONARY ANGIOGRAPHY N/A 05/21/2020   Procedure: RIGHT/LEFT HEART CATH AND CORONARY ANGIOGRAPHY;  Surgeon: Larey Dresser, MD;  Location: [facility];  Service: Cardiovascular;  Laterality: N/A;   Social History:  reports that he quit smoking about 5 years ago. His smoking use included cigarettes. He has a 24.00 pack-year smoking history. He has never used smokeless tobacco. He reports current drug use. Drug: Marijuana. He reports that he does not drink alcohol. Family History:  Family History  Problem Relation Age of Onset   Arrhythmia Mother        MOTHER LIVED TO BE 8   Coronary  artery disease Mother    Heart attack Mother    Hypertension Mother    Coronary artery disease Father 9   Heart disease Father    Heart attack Father    Heart attack Brother    Stroke Neg Hx      HOME MEDICATIONS: Allergies as of 04/04/2022   No Known Allergies      Medication List        Accurate as of April 04, 2022  2:32 PM. If you have any questions, ask your nurse or doctor.          acetaminophen 325 MG tablet Commonly known as: TYLENOL Take 2 tablets (650 mg total) by mouth every 4 (four) hours as needed for headache or mild pain.   aspirin EC 81 MG tablet Take 1 tablet (81 mg total) by mouth daily.   atorvastatin 80 MG tablet Commonly known as: LIPITOR TAKE 1 TABLET BY MOUTH EVERY DAY AT 6 PM   blood glucose meter kit and supplies Dispense  based on patient and insurance preference. Use up to four times daily as directed. (FOR ICD-10 E10.9, E11.9).   buPROPion 150 MG 24 hr tablet Commonly known as: WELLBUTRIN XL TAKE 1 TABLET BY MOUTH EVERY DAY   carvedilol 12.5 MG tablet Commonly known as: COREG Take 1.5 tablets (18.75 mg total) by mouth 2 (two) times daily.   clotrimazole-betamethasone cream Commonly known as: LOTRISONE Apply 1 application topically as needed.   dapagliflozin propanediol 10 MG Tabs tablet Commonly known as: Farxiga Take 1 tablet (10 mg total) by mouth daily before breakfast.   digoxin 0.125 MG tablet Commonly known as: LANOXIN Take 1 tablet (125 mcg total) by mouth daily.   Entresto 97-103 MG Generic drug: sacubitril-valsartan Take 1 tablet by mouth 2 (two) times daily.   fenofibrate 160 MG tablet Take 1 tablet (160 mg total) by mouth daily.   furosemide 40 MG tablet Commonly known as: LASIX Take 1 tablet (40 mg total) by mouth daily.   icosapent Ethyl 1 g capsule Commonly known as: Vascepa Take 2 capsules (2 g total) by mouth 2 (two) times daily.   isosorbide-hydrALAZINE 20-37.5 MG tablet Commonly known as:  BIDIL Take 1 tablet by mouth 3 (three) times daily.   Lantus SoloStar 100 UNIT/ML Solostar Pen Generic drug: insulin glargine Inject 26 Units into the skin daily.   metFORMIN 500 MG 24 hr tablet Commonly known as: GLUCOPHAGE-XR Take 1 tablet (500 mg total) by mouth 2 (two) times daily.   nitroGLYCERIN 0.4 MG SL tablet Commonly known as: NITROSTAT Place 1 tablet (0.4 mg total) under the tongue every 5 (five) minutes as needed for chest pain.   Ozempic (2 MG/DOSE) 8 MG/3ML Sopn Generic drug: Semaglutide (2 MG/DOSE) Inject 2 mg into the skin once a week.   Pen Needles 3/16" 31G X 5 MM Misc Lantus 23   potassium chloride SA 20 MEQ tablet Commonly known as: KLOR-CON M Take 2 tablets (40 mEq total) by mouth daily.   Repatha SureClick 409 MG/ML Soaj Generic drug: Evolocumab INJECT 1 PEN INTO THE SKIN EVERY 14 (FOURTEEN) DAYS.   spironolactone 25 MG tablet Commonly known as: ALDACTONE Take 1 tablet (25 mg total) by mouth daily.   triamcinolone cream 0.1 % Commonly known as: KENALOG Apply 1 application topically as needed.         OBJECTIVE:   Vital Signs: BP 126/80 (BP Location: Left Arm, Patient Position: Sitting, Cuff Size: Large)   Pulse 79   Ht 5' 7"  (1.702 m)   Wt 239 lb (108.4 kg)   SpO2 97%   BMI 37.43 kg/m   Wt Readings from Last 3 Encounters:  04/04/22 239 lb (108.4 kg)  03/11/22 236 lb 12.8 oz (107.4 kg)  01/11/22 248 lb 12.8 oz (112.9 kg)     Exam: General: Pt appears well and is in NAD  Lungs: Clear with good BS bilat with no rales, rhonchi, or wheezes  Heart: RRR   Extremities: No pretibial edema.   Neuro: MS is good with appropriate affect, pt is alert and Ox3   DM FOOT Exam :04/04/2022  The skin of the feet is without sores or ulcerations. The pedal pulses are 1+ on right and 1+ on left. The sensation is intact to a screening 5.07, 10 gram monofilament bilaterally    DATA REVIEWED:  Lab Results  Component Value Date   HGBA1C 6.4 (A)  07/23/2021   HGBA1C 7.5 (H) 03/19/2021   HGBA1C 6.8 (A) 09/23/2020   Lab Results  Component Value Date   MICROALBUR 42.4 (H) 03/19/2021   LDLCALC 43 01/11/2022   CREATININE 1.32 (H) 04/04/2022   Lab Results  Component Value Date   MICRALBCREAT 72.8 (H) 03/19/2021     Lab Results  Component Value Date   CHOL 107 01/11/2022   HDL 45 01/11/2022   LDLCALC 43 01/11/2022   LDLDIRECT 51 02/01/2021   TRIG 96 01/11/2022   CHOLHDL 2.4 01/11/2022       Results for Peter Russo, Peter Russo (MRN [id number]) as of 03/19/2021 13:31  Ref. Range 03/19/2021 08:18  Sodium Latest Ref Range: 135 - 145 mEq/L 138  Potassium Latest Ref Range: 3.5 - 5.1 mEq/L 4.2  Chloride Latest Ref Range: 96 - 112 mEq/L 105  CO2 Latest Ref Range: 19 - 32 mEq/L 26  Glucose Latest Ref Range: 70 - 99 mg/dL 132 (H)  BUN Latest Ref Range: 6 - 23 mg/dL 18  Creatinine Latest Ref Range: 0.40 - 1.50 mg/dL 1.43  Calcium Latest Ref Range: 8.4 - 10.5 mg/dL 9.5  GFR Latest Ref Range: >60.00 mL/min 58.55 (L)  MICROALB/CREAT RATIO Latest Ref Range: 0.0 - 30.0 mg/g 72.8 (H)  Hemoglobin A1C Latest Ref Range: 4.6 - 6.5 % 7.5 (H)  Creatinine,U Latest Units: mg/dL 58.3  Microalb, Ur Latest Ref Range: 0.0 - 1.9 mg/dL 42.4 (H)    ASSESSMENT / PLAN / RECOMMENDATIONS:   1) Type 2 Diabetes Mellitus, Optimally controlled, With microalbuminuria - Most recent A1c of 5.7 %. Goal A1c < 7.0 %.     - A1c is optimal , will reduce insulin as below  - Intolerant to higher doses of Metformin  - He was started on Ozempic through his cardiologist to help with weight loss,in preparation for heart transplant    MEDICATIONS: Decrease  Lantus 20 units daily  Continue Metformin 500 mg XR BID Continue Farxiga 10 mg daily  Continue Ozempic 2 mg weekly    EDUCATION / INSTRUCTIONS: BG monitoring instructions: Patient is instructed to check his blood sugars 1 times a day, fasting. Call [facility] if: BG persistently < 70  I reviewed  the Rule of 15 for the treatment of hypoglycemia in detail with the patient. Literature supplied.    2) Diabetic complications:  Eye: Does not have known diabetic retinopathy.  Neuro/ Feet: Does not have known diabetic peripheral neuropathy .  Renal: Patient does not have known baseline CKD. He   is on an ACEI/ARB at present.   3) Dyslipidemia:   -He had an elevated TG up to 671 mg/DL in 10/2020, with improvement in his readings -Managed by cardiology   Medication Atorvastatin 80 mg daily  Repatha 140 mg q. 14 days    4) Microalbuminuria:  -Slight elevation in the MA/CR ratio at 72.8 (03/2021) -The patient is already on valsartan -We will continue to monitor  F/U in 6 months   Signed electronically by: Mack Guise, MD  [facility]  [facility] [address] [place], [place] [address] Phone: [phone] FAX: [phone]   CC: Maximiano Coss, NP [place] [address] Phone: [phone]  Fax: [phone]  Return to Endocrinology clinic as below: Future Appointments  Date Time Provider [place]  04/15/2022  9:00 AM Sueanne Margarita, MD [facility]  04/20/2022  8:00 AM [facility] ECHO OP 1 [facility]  04/21/2022  2:00 PM Conception Chancy, PsyD [facility] None  06/02/2022  8:30 AM [facility] PA/NP [facility] None  07/18/2022  1:40 PM  [name], PA-C [facility]

## 2022-04-04 NOTE — Patient Instructions (Addendum)
-   Decrease Lantus 20 units daily  - Continue Metformin 500 mg XR , 1 tablet twice daily  - Continue Farxiga 10 mg, 1 tablet with Breakfast  - Continue Ozempic 2 mg weekly     HOW TO TREAT LOW BLOOD SUGARS (Blood sugar LESS THAN 70 MG/DL) Please follow the RULE OF 15 for the treatment of hypoglycemia treatment (when your (blood sugars are less than 70 mg/dL)   STEP 1: Take 15 grams of carbohydrates when your blood sugar is low, which includes:  3-4 GLUCOSE TABS  OR 3-4 OZ OF JUICE OR REGULAR SODA OR ONE TUBE OF GLUCOSE GEL    STEP 2: RECHECK blood sugar in 15 MINUTES STEP 3: If your blood sugar is still low at the 15 minute recheck --> then, go back to STEP 1 and treat AGAIN with another 15 grams of carbohydrates.

## 2022-04-05 ENCOUNTER — Telehealth: Payer: Self-pay | Admitting: Pharmacy Technician

## 2022-04-05 ENCOUNTER — Other Ambulatory Visit (HOSPITAL_COMMUNITY): Payer: Self-pay

## 2022-04-05 NOTE — Telephone Encounter (Signed)
Patient Advocate Encounter   Received notification from [employer]/office that prior authorization for Lantus is required/requested.  Per Test Claim: Lantus covered at $0   Called the pharmacy and they were able to process.

## 2022-04-15 ENCOUNTER — Ambulatory Visit: Payer: Medicare Other | Attending: Cardiology | Admitting: Cardiology

## 2022-04-15 ENCOUNTER — Encounter: Payer: Self-pay | Admitting: Cardiology

## 2022-04-15 ENCOUNTER — Telehealth: Payer: Self-pay | Admitting: *Deleted

## 2022-04-15 NOTE — Telephone Encounter (Signed)
[employer] (dme) notified by fax.

## 2022-04-15 NOTE — Progress Notes (Signed)
This encounter was created in error - please disregard.

## 2022-04-15 NOTE — Telephone Encounter (Addendum)
-----   Message from Theresia Majors, RN sent at 04/15/2022 10:03 AM EDT ----- Patient is currently homeless and unable to use his CPAP device. Can you call his DME and let him know that he is moving in with his cousin permanently next week. Please let the insurance company know that he has been noncompliant because he has not had a place to live but his living situation will change next week.   Thanks! Carly

## 2022-04-20 ENCOUNTER — Ambulatory Visit (HOSPITAL_COMMUNITY)
Admission: RE | Admit: 2022-04-20 | Discharge: 2022-04-20 | Disposition: A | Discharge status: Home / Self Care | Payer: Medicare Other | Source: Ambulatory Visit | Attending: Registered Nurse | Admitting: Registered Nurse

## 2022-04-20 DIAGNOSIS — I2510 Atherosclerotic heart disease of native coronary artery without angina pectoris: Secondary | ICD-10-CM | POA: Diagnosis not present

## 2022-04-20 DIAGNOSIS — I252 Old myocardial infarction: Secondary | ICD-10-CM | POA: Diagnosis not present

## 2022-04-20 DIAGNOSIS — E118 Type 2 diabetes mellitus with unspecified complications: Secondary | ICD-10-CM | POA: Insufficient documentation

## 2022-04-20 DIAGNOSIS — F17210 Nicotine dependence, cigarettes, uncomplicated: Secondary | ICD-10-CM | POA: Insufficient documentation

## 2022-04-20 DIAGNOSIS — I110 Hypertensive heart disease with heart failure: Secondary | ICD-10-CM | POA: Diagnosis not present

## 2022-04-20 DIAGNOSIS — I471 Supraventricular tachycardia: Secondary | ICD-10-CM | POA: Diagnosis not present

## 2022-04-20 DIAGNOSIS — R079 Chest pain, unspecified: Secondary | ICD-10-CM | POA: Insufficient documentation

## 2022-04-20 DIAGNOSIS — I5022 Chronic systolic (congestive) heart failure: Secondary | ICD-10-CM | POA: Diagnosis not present

## 2022-04-20 LAB — ECHOCARDIOGRAM COMPLETE
Area-P 1/2: 5.84 cm2
Calc EF: 21 %
S' Lateral: 5.75 cm
Single Plane A2C EF: 25.7 %
Single Plane A4C EF: 18 %

## 2022-04-20 NOTE — Progress Notes (Signed)
  Echocardiogram 2D Echocardiogram has been performed.  Sheralyn Boatman R 04/20/2022, 9:00 AM

## 2022-04-21 ENCOUNTER — Ambulatory Visit (INDEPENDENT_AMBULATORY_CARE_PROVIDER_SITE_OTHER): Payer: Medicare Other | Admitting: Psychologist

## 2022-04-21 DIAGNOSIS — F330 Major depressive disorder, recurrent, mild: Secondary | ICD-10-CM

## 2022-04-21 NOTE — Plan of Care (Signed)

## 2022-04-21 NOTE — Progress Notes (Signed)
[facility] Counsel[facility] Initial Adult Exam  Name: Peter Russo Date: 04/21/2022 MRN: [id number] DOB: 07/13/1976 PCP: Maximiano Coss, NP  Time spent: 02:05 pm to 2:35 pm; total time: 30 minutes  This session was held via video webex teletherapy due to the coronavirus risk at this time. The patient consented to video teletherapy and was located at his home during this session. He is aware it is the responsibility of the patient to secure confidentiality on his end of the session. The provider was in a private home office for the duration of this session. Limits of confidentiality were discussed with the patient.   Guardian/Payee:  NA    Paperwork requested: No   Reason for Visit /Presenting Problem: Depression  Mental Status Exam: Appearance:   Well Groomed     Behavior:  Appropriate  Motor:  Normal  Speech/Language:   Clear and Coherent  Affect:  Appropriate  Mood:  normal  Thought process:  normal  Thought content:    WNL  Sensory/Perceptual disturbances:    WNL  Orientation:  oriented to person, place, and time/date  Attention:  Good  Concentration:  Good  Memory:  WNL  Fund of knowledge:   Good  Insight:    Fair  Judgment:   Fair  Impulse Control:  Good     Reported Symptoms:   The patient endorsed experiencing the following: feeling down, sad, tearful, rumination of negative thoughts, social isolation, avoiding pleasurable activities, and thoughts of hopelessness. He denied suicidal and homicidal ideation.   Risk Assessment: Danger to Self:  No Self-injurious Behavior: No Danger to Others: No Duty to Warn:no Physical Aggression / Violence:No  Access to Firearms a concern: No  Gang Involvement:No  Patient / guardian was educated about steps to take if suicide or homicide risk level increases between visits: n/a While future psychiatric events cannot be accurately predicted, the patient does not currently require acute inpatient psychiatric care and  does not currently meet [facility] involuntary commitment criteria.  Substance Abuse History: Current substance abuse:  Patient stated that he smokes a cigarette daily.      Past Psychiatric History:   No previous psychological problems have been observed Outpatient Providers:NA History of Psych Hospitalization: No  Psychological Testing:  NA    Abuse History:  Victim of: No.,  NA    Report needed: No. Victim of Neglect:No. Perpetrator of  NA   Witness / Exposure to Domestic Violence: No   Protective Services Involvement: No  Witness to [employer] Violence:  No   Family History:  Family History  Problem Relation Age of Onset   Arrhythmia Mother        MOTHER LIVED TO BE 102   Coronary artery disease Mother    Heart attack Mother    Hypertension Mother    Coronary artery disease Father 60   Heart disease Father    Heart attack Father    Heart attack Brother    Stroke Neg Hx     Living situation: the patient lives with their family  Sexual Orientation: Straight  Relationship Status: Engaged  Name of spouse / other:Lashonda If a parent, number of children / ages:Patient has three children who are 32, 53, and 61 years old. The patient's two oldest children are women and his youngest is his son.   Support Systems: Patient endorsed his fiance and sister.   Financial Stress:  No   Income/Employment/Disability: Primary school teacher Service: No   Educational History: Education:  high school diploma/GED  Religion/Sprituality/World View: Christian  Any cultural differences that may affect / interfere with treatment:  not applicable   Recreation/Hobbies: Fishing  Stressors: Health problems    Strengths: Supportive Relationships  Barriers:  NA   Legal History: Pending legal issue / charges:  NA. History of legal issue / charges:  NA  Medical History/Surgical History: reviewed Past Medical History:  Diagnosis Date   Anxiety    CAD S/P  percutaneous coronary angioplasty    a. s/p BMS-mLAD 2010. b. DES to RCA 2012. c. 04/2015: DES to LCx and PCI to LAD c/b dissection with subsequent overlapping DES to mLAD - r/i for NSTEMI afterwards; d. 08/2015 Ant STEMI in setting of noncompliance->139mAD ISR (3.0x16 Synergy DES); e. 03/2016 PCI of apical LAD; f. 03/2016 Relook Cath: patent LAD stents, RCA 100p CTO-->Med Rx; g. 10/2016 Ant STEMI: PTCA 100p LAD.   Chronic combined systolic and diastolic CHF (congestive heart failure) ([place]    a. 03/2016 Echo: EF 30-35%, Gr2 DD;  b. 08/2016 Echo: EF 40%.   Depression    Essential hypertension    GAD (generalized anxiety disorder)    GERD (gastroesophageal reflux disease)    Hypercholesteremia    Ischemic cardiomyopathy    a. prior EF 25-35 percent at cath, 35-40 percent by echo; b. 03/2016 Echo: EF 30-35%, diff HK, Gr2 DD; c. 08/2016 Echo: EF 40%, base/mid inferior/inferowseptal AK, mildly dil LA.   Morbid obesity ([place]    Pre-diabetes    SVT (supraventricular tachycardia) ([place] [facility] 10 YRS AGO   PRESUMED AVNRT, ECHO 12/07 WITH EF 65%, MILD LAE   Tobacco abuse    a. 20 + pack years, quit 10/2016.    Past Surgical History:  Procedure Laterality Date   CARDIAC CATHETERIZATION N/A 04/23/2015   Procedure: Left Heart Cath and Coronary Angiography;  Surgeon: PJosue Hector MD;  [facility];  Service: Cardiovascular;  Laterality: N/A;   CARDIAC CATHETERIZATION N/A 04/23/2015   Procedure: Coronary Stent Intervention;  Surgeon: MSherren Mocha MD;  [place];  Service: Cardiovascular;  Laterality: N/A;   CARDIAC CATHETERIZATION N/A 09/14/2015   Procedure: Left Heart Cath and Coronary Angiography;  Surgeon: HBelva Crome MD;  LAD 100%, CFX 10% ISR, RCA 100% (chronic), EF 25-35%   CARDIAC CATHETERIZATION N/A 09/14/2015   Procedure: Coronary Stent Intervention;  Surgeon: HBelva Crome MD;  [place];  Service: Cardiovascular;  Laterality: N/A; 3.0 x 16 mm  Synergy DES LAD   CARDIAC CATHETERIZATION  03/16/2016   CARDIAC CATHETERIZATION N/A 03/16/2016   Procedure: Left Heart Cath and Coronary Angiography;  Surgeon: HBelva Crome MD;  [place];  Service: Cardiovascular;  Laterality: N/A;   CARDIAC CATHETERIZATION N/A 03/16/2016   Procedure: Coronary Balloon Angioplasty;  Surgeon: HBelva Crome MD;  [facility];  Service: Cardiovascular;  Laterality: N/A;   CARDIAC CATHETERIZATION N/A 03/25/2016   Procedure: Left Heart Cath and Coronary Angiography;  Surgeon: Peter M JMartinique MD;  [facility];  Service: Cardiovascular;  Laterality: N/A;   CORONARY ANGIOPLASTY WITH STENT PLACEMENT  2010; 2013   CORONARY BALLOON ANGIOPLASTY N/A 10/19/2016   Procedure: Coronary Balloon Angioplasty;  Surgeon: JJettie Booze MD;  [place];  Service: Cardiovascular;  Laterality: N/A;   INTRAVASCULAR ULTRASOUND/IVUS N/A 10/19/2016   Procedure: Intravascular Ultrasound/IVUS;  Surgeon: JJettie Booze MD;  [place] [facility];  Service: Cardiovascular;  Laterality: N/A;   LEFT HEART CATH AND CORONARY ANGIOGRAPHY N/A 10/19/2016   Procedure: Left Heart Cath and Coronary Angiography;  Surgeon: Jettie Booze, MD;  [facility] [place] [facility];  Service: Cardiovascular;  Laterality: N/A;   RIGHT/LEFT HEART CATH AND CORONARY ANGIOGRAPHY N/A 05/21/2020   Procedure: RIGHT/LEFT HEART CATH AND CORONARY ANGIOGRAPHY;  Surgeon: Larey Dresser, MD;  Location: [place] [facility];  Service: Cardiovascular;  Laterality: N/A;    Medications: Current Outpatient Medications  Medication Sig Dispense Refill   acetaminophen (TYLENOL) 325 MG tablet Take 2 tablets (650 mg total) by mouth every 4 (four) hours as needed for headache or mild pain.     aspirin 81 MG EC tablet Take 1 tablet (81 mg total) by mouth daily. 90 tablet 3   atorvastatin (LIPITOR) 80 MG tablet TAKE 1 TABLET BY MOUTH EVERY DAY AT 6 PM 90  tablet 3   blood glucose meter kit and supplies Dispense based on patient and insurance preference. Use up to four times daily as directed. (FOR ICD-10 E10.9, E11.9). 1 each 0   buPROPion (WELLBUTRIN XL) 150 MG 24 hr tablet TAKE 1 TABLET BY MOUTH EVERY DAY 90 tablet 3   carvedilol (COREG) 12.5 MG tablet Take 1.5 tablets (18.75 mg total) by mouth 2 (two) times daily. 180 tablet 3   clotrimazole-betamethasone (LOTRISONE) cream Apply 1 application topically as needed.     dapagliflozin propanediol (FARXIGA) 10 MG TABS tablet Take 1 tablet (10 mg total) by mouth daily before breakfast. 90 tablet 3   digoxin (LANOXIN) 0.125 MG tablet Take 1 tablet (125 mcg total) by mouth daily. 90 tablet 3   Evolocumab (REPATHA SURECLICK) 086 MG/ML SOAJ INJECT 1 PEN INTO THE SKIN EVERY 14 (FOURTEEN) DAYS. 2 mL 11   fenofibrate 160 MG tablet Take 1 tablet (160 mg total) by mouth daily. 90 tablet 3   furosemide (LASIX) 40 MG tablet Take 1 tablet (40 mg total) by mouth daily. 30 tablet 6   icosapent Ethyl (VASCEPA) 1 g capsule Take 2 capsules (2 g total) by mouth 2 (two) times daily. 120 capsule 11   insulin glargine (LANTUS SOLOSTAR) 100 UNIT/ML Solostar Pen Inject 20 Units into the skin daily. 15 mL 6   Insulin Pen Needle (PEN NEEDLES 3/16") 31G X 5 MM MISC 1 Device by Other route daily in the afternoon. Lantus 23 100 each 3   isosorbide-hydrALAZINE (BIDIL) 20-37.5 MG tablet Take 1 tablet by mouth 3 (three) times daily. 90 tablet 1   metFORMIN (GLUCOPHAGE-XR) 500 MG 24 hr tablet Take 1 tablet (500 mg total) by mouth 2 (two) times daily. 180 tablet 3   nitroGLYCERIN (NITROSTAT) 0.4 MG SL tablet Place 1 tablet (0.4 mg total) under the tongue every 5 (five) minutes as needed for chest pain. 25 tablet 3   potassium chloride SA (KLOR-CON M) 20 MEQ tablet Take 2 tablets (40 mEq total) by mouth daily. 180 tablet 3   sacubitril-valsartan (ENTRESTO) 97-103 MG Take 1 tablet by mouth 2 (two) times daily. 60 tablet 11    Semaglutide, 2 MG/DOSE, (OZEMPIC, 2 MG/DOSE,) 8 MG/3ML SOPN Inject 2 mg into the skin once a week. 9 mL 3   spironolactone (ALDACTONE) 25 MG tablet Take 1 tablet (25 mg total) by mouth daily. 90 tablet 3   triamcinolone cream (KENALOG) 0.1 % Apply 1 application topically as needed.     No current facility-administered medications for this visit.    No Known Allergies  Diagnoses:  F33.0  major depressive affective disorder, recurrent, mild    Plan of Care: The patient is a 34 year Black male who was referred due to experiencing depression secondary to his heart concerns. The patient lives at home with his wife and children. The patient meets criteria for a diagnosis of F33.0 major depressive affective disorder, recurrent, mild based off of the following: feeling down, sad, tearful, rumination of negative thoughts, social isolation, avoiding pleasurable activities, and thoughts of hopelessness. He denied suicidal and homicidal ideation.   The patient stated that he wants to process his emotions and share about different events that are occurring in his life.   This psychologist makes the recommendation that the patient participate in therapy at least once a month.    Conception Chancy, PsyD

## 2022-04-21 NOTE — Progress Notes (Signed)
                Levonne Carreras, PsyD 

## 2022-04-24 DIAGNOSIS — G4733 Obstructive sleep apnea (adult) (pediatric): Secondary | ICD-10-CM | POA: Diagnosis not present

## 2022-05-19 ENCOUNTER — Ambulatory Visit: Payer: Medicare Other | Admitting: Psychologist

## 2022-06-02 ENCOUNTER — Encounter (HOSPITAL_COMMUNITY): Payer: Medicare Other

## 2022-06-08 NOTE — Progress Notes (Incomplete)
PCP: Maximiano Coss, NP Cardiology: Dr. Johnsie Cancel HF Cardiology: Dr. Aundra Dubin  46 y.o. with history of CAD s/p multiple MIs and PCIs, ischemic cardiomyopathy, HTN, active smoking, and diabetes.  Patient has had multiple coronary interventions, fist in 2010.  His RCA is now chronically occluded, and he has had many stents in the LAD.  Echo in 1/18 showed EF 40%.  EF was down to 20-25% in 12/19.  Echo in 7/21 was reported as EF 25-30%, moderate LV enlargement, normal RV.  Of note, patient seen in urgent care in 7/21 and noted to have blood glucose > 400.  HgbA1c was 11.3. He was started on metformin at urgent care with new diagnosis of diabetes.     Given more frequent chest pain and dyspnea with moderate exertion, I took him for LHC/RHC in 10/21.  This showed low output HF with CI 1.48.  His LAD was totally occluded (long segment) with no collaterals, the RCA was totally occluded with R-R collaterals, and the LCx was patent.  He was admitted and started on milrinone, diuresed.  He was weaned off milrinone.  Lifevest was recommended but he refused.  He was seen by cardiac surgery, not a candidate for CABG.  We began LVAD evaluation (active smoker, not transplant candidate).  Echo in 10/21 showed EF 20-25% with severe LV dilation.  CPX (10/21) showed peak VO2 16, VE/VCO2 slope 37, RER 1.13 => moderate HF limitation.   Echo in 8/22 showed EF 20-25%, moderate LV dilation, mildly decreased RV systolic function. CPX in 7/23 showed moderate-severe functional limitation, somewhat worse than 10/21.   Patient returns for followup of CHF and CAD.  Weight is down 12 lbs.  He denies significant exertional dyspnea or chest pain.  No orthopnea/PND. No lightheadedness. He is not smoking.  So far, he has refused ICD.  He gets very anxious and tearful when discussing his condition.   Labs (7/21): K 5, creatinine 1.11, glucose 464, hgbA1 11.3 Labs (9/21): LDL 175 Labs (10/21): K 3.9, creatinine 1.0, hgbA1c 12.2 => 11.5 Labs  (11/21): K 3.7, creatinine 1.05 Labs (12/21): K 4, creatinine 1.22 Labs (1/22): digoxin 0.2, hgbA1c 6.5, K 4.1, creatinine 1.16 Labs (3/22): LDL 16, TGs 671 Labs (6/22): LDL 51, TGs 161, HDL 47 Labs (8/22): K 4.2, creatinine 1.4 Labs (5/23): K 4.2, creatinine 1.37, BNP 132, digoxin < 0.2, LDL 43, TGs 56  ECG (personally reviewed): NSR, IVCD 122 msec  PMH: 1. CAD:  - BMS to LAD in 2010.  - DES to RCA on 2012 - DES to LCx and LAD in 7/61, complicated by dissection with overlapping DES to mid LAD.  - Anterior STEMI in 1/17 with DES to totally occluded LAD (stent thrombosis).  - 8/17 PCI to apical LAD, RCA noted to be totally occluded with collaterals.  - 3/18 anterior STEMI with PTCA to totally occluded LAD.  - Coronary angiography (10/21): LAD was totally occluded (long segment) with no collaterals, the RCA was totally occluded with R-R collaterals, and the LCx was patent 2. Chronic systolic CHF: Ischemic cardiomyopathy.   - Echo (1/18): EF 40%.  - Echo (12/19): EF 20-25% - Echo (7/21): EF 25-30%, moderate LV enlargement, mild LVH, RV normal.  - RHC (10/21): mean RA 6, PA 47/13, mean PCWP 20, CI 1.48, PVR 2.4 WU - CPX (10/21): peak VO2 16, VE/VCO2 slope 37, RER 1.13 => moderate HF limitation - Echo (8/22): EF 20-25%, moderate LV dilation, mildly decreased RV systolic function - CPX (9/50): VO2 max 13.7,  VE/VOC2 slope 41, RER 1.10 => moderate to severe limitation due to body habitus and CHF.  3. HTN 4. H/o AVNRT 5. Smoking: Active smoker, 1-2 cigs/day.  - PFTs in 10/21 with minimal obstructive disease.  6. OSA: Severe 7. Type 2 diabetes: Diagnosed in 7/21 8. Carotid dopplers (10/21): 1-39% BICA stenosis.  9. PAD: ABIs (10/21) were mildly decreased.  10. Hyperlipidemia  Social History   Socioeconomic History   Marital status: Significant Other    Spouse name: Not on file   Number of children: 3   Years of education: Not on file   Highest education level: Not on file   Occupational History   Occupation: Aeronautical engineer: [employer]  Tobacco Use   Smoking status: Former    Packs/day: 1.00    Years: 24.00    Total pack years: 24.00    Types: Cigarettes    Quit date: 11/03/2016    Years since quitting: 5.5   Smokeless tobacco: Never   Tobacco comments:    quit on 03/15/16  Vaping Use   Vaping Use: Never used  Substance and Sexual Activity   Alcohol use: No   Drug use: Yes    Types: Marijuana    Comment: last time 02/2016   Sexual activity: Yes  Other Topics Concern   Not on file  Social History Narrative   Lives in [place] by himself.  Sometimes stays with girlfriend in [place].  Does not routinely exercise.   Social Determinants of Health   Financial Resource Strain: Not on file  Food Insecurity: Not on file  Transportation Needs: Not on file  Physical Activity: Not on file  Stress: Not on file  Social Connections: Not on file  Intimate Partner Violence: Not on file   Family History  Problem Relation Age of Onset   Arrhythmia Mother        MOTHER LIVED TO BE 102   Coronary artery disease Mother    Heart attack Mother    Hypertension Mother    Coronary artery disease Father 45   Heart disease Father    Heart attack Father    Heart attack Brother    Stroke Neg Hx    ROS: All systems reviewed and negative except as per HPI.   Current Outpatient Medications  Medication Sig Dispense Refill   acetaminophen (TYLENOL) 325 MG tablet Take 2 tablets (650 mg total) by mouth every 4 (four) hours as needed for headache or mild pain.     aspirin 81 MG EC tablet Take 1 tablet (81 mg total) by mouth daily. 90 tablet 3   atorvastatin (LIPITOR) 80 MG tablet TAKE 1 TABLET BY MOUTH EVERY DAY AT 6 PM 90 tablet 3   blood glucose meter kit and supplies Dispense based on patient and insurance preference. Use up to four times daily as directed. (FOR ICD-10 E10.9, E11.9). 1 each 0   buPROPion (WELLBUTRIN XL) 150 MG 24 hr tablet TAKE 1 TABLET BY  MOUTH EVERY DAY 90 tablet 3   carvedilol (COREG) 12.5 MG tablet Take 1.5 tablets (18.75 mg total) by mouth 2 (two) times daily. 180 tablet 3   clotrimazole-betamethasone (LOTRISONE) cream Apply 1 application topically as needed.     dapagliflozin propanediol (FARXIGA) 10 MG TABS tablet Take 1 tablet (10 mg total) by mouth daily before breakfast. 90 tablet 3   digoxin (LANOXIN) 0.125 MG tablet Take 1 tablet (125 mcg total) by mouth daily. 90 tablet 3   Evolocumab (REPATHA  SURECLICK) 174 MG/ML SOAJ INJECT 1 PEN INTO THE SKIN EVERY 14 (FOURTEEN) DAYS. 2 mL 11   fenofibrate 160 MG tablet Take 1 tablet (160 mg total) by mouth daily. 90 tablet 3   furosemide (LASIX) 40 MG tablet Take 1 tablet (40 mg total) by mouth daily. 30 tablet 6   icosapent Ethyl (VASCEPA) 1 g capsule Take 2 capsules (2 g total) by mouth 2 (two) times daily. 120 capsule 11   insulin glargine (LANTUS SOLOSTAR) 100 UNIT/ML Solostar Pen Inject 20 Units into the skin daily. 15 mL 6   Insulin Pen Needle (PEN NEEDLES 3/16") 31G X 5 MM MISC 1 Device by Other route daily in the afternoon. Lantus 23 100 each 3   isosorbide-hydrALAZINE (BIDIL) 20-37.5 MG tablet Take 1 tablet by mouth 3 (three) times daily. 90 tablet 1   metFORMIN (GLUCOPHAGE-XR) 500 MG 24 hr tablet Take 1 tablet (500 mg total) by mouth 2 (two) times daily. 180 tablet 3   nitroGLYCERIN (NITROSTAT) 0.4 MG SL tablet Place 1 tablet (0.4 mg total) under the tongue every 5 (five) minutes as needed for chest pain. 25 tablet 3   potassium chloride SA (KLOR-CON M) 20 MEQ tablet Take 2 tablets (40 mEq total) by mouth daily. 180 tablet 3   sacubitril-valsartan (ENTRESTO) 97-103 MG Take 1 tablet by mouth 2 (two) times daily. 60 tablet 11   Semaglutide, 2 MG/DOSE, (OZEMPIC, 2 MG/DOSE,) 8 MG/3ML SOPN Inject 2 mg into the skin once a week. 9 mL 3   spironolactone (ALDACTONE) 25 MG tablet Take 1 tablet (25 mg total) by mouth daily. 90 tablet 3   triamcinolone cream (KENALOG) 0.1 % Apply 1  application topically as needed.     No current facility-administered medications for this visit.   There were no vitals taken for this visit. General: NAD Neck: No JVD, no thyromegaly or thyroid nodule.  Lungs: Clear to auscultation bilaterally with normal respiratory effort. CV: Nondisplaced PMI.  Heart regular S1/S2, no S3/S4, no murmur.  No peripheral edema.  No carotid bruit.  Normal pedal pulses.  Abdomen: Soft, nontender, no hepatosplenomegaly, no distention.  Skin: Intact without lesions or rashes.  Neurologic: Alert and oriented x 3.  Psych: Normal affect. Extremities: No clubbing or cyanosis.  HEENT: Normal.   Assessment/Plan: 1. CAD: RCA known to be chronically occluded.  He has had multiple PCIs to LAD.  Repeat LHC in 10/21 done for recurrent angina demonstrated occluded RCA (known from past) and occluded pLAD with no collaterals. His LCx was patent. I do not think we have a revascularization option, multiple overlapping LAD stents, now occluded and there are no good surgical targets. No recent chest pain.   - Continue ASA 81  - Continue Atorvastatin 80  2. Chronic systolic CHF: Ischemic cardiomyopathy.  Echo in 10/21 with EF 20-25%, severe LV dilation, mildly decreased RV systolic function.  RHC in 10/21 demonstrated elevated PCWP, mild pulmonary venous hypertension and low cardiac output with CI 1.4. Patient was transiently on milrinone but weaned off. CPX in 10/21 showed peak VO2 16, VE/VCO2 slope 37, RER 1.13 => moderate HF limitation.  Echo in 8/22 showed EF 20-25%.  CPX was worse in 7/23 with moderate-severe functional limitation.  Despite the CPX, he reports doing very well symptomatically with no significant exertional dyspnea.  He describes NYHA class I-II.  He is not volume overloaded on exam.  Weight has been trending down with semaglutide use.  - Continue Lasix 40 qam/20 qpm.   BMET/BNP  today.  - Continue Entresto 97/103 bid.  - Increase Coreg to 18.75 mg bid.  -  Continue spironolactone 25 daily  - Continue digoxin 0.125 daily, check level.   - Continue Farxiga 10 mg daily.    - Continue Bidil 1 tab tid.  - He has had LVAD workup in the past.  He had low CO on RHC and in 7/23 had CPX showing moderate-severe functional limitation though symptomatically he says he is doing very well, describes NYHA class 1-II.  With stable symptoms, we have held off on advanced therapies for now.  Transplant would be a better option than LVAD if something is needed given his age, weight is improving and his diabetes is under better control. He has quit smoking for > 6 months.  - He is not a CRT candidate. He has seen Dr. Caryl Comes regarding ICD; I recommended again that he go ahead and have ICD placed, but he is nervous about this and still wants to think more about it (same as last appointment).  I will arrange repeat echo and then readdress ICD with him.  3. Type 2 diabetes: New diagnosis in 7/21 with hgbA1c 11.3.  Glucose now under better control. - Continue Farxiga 10 mg daily.  4. OSA: Severe OSA, uses CPAP.  5. Smoking:  He quit in 10/21.  PFTs with minimal obstructive disease.  - Continue Wellbutrin 6. Hyperlipidemia: LDL is at goal on Repatha and atorvastatin.  TGs high when last checked, now on Vascepa and fenofibrate.  Followed by lipid clinic. Good lipids in 5/23. 7. Obesity: Weight coming down on semaglutide.  - Continue efforts at diet and exercise.   Followup 2 months with APP.    [place] 06/08/2022

## 2022-06-10 ENCOUNTER — Encounter (HOSPITAL_COMMUNITY): Payer: Medicare Other

## 2022-06-30 ENCOUNTER — Ambulatory Visit: Payer: Medicare Other | Attending: Cardiology | Admitting: Cardiology

## 2022-06-30 ENCOUNTER — Encounter: Payer: Self-pay | Admitting: Cardiology

## 2022-06-30 VITALS — Ht 67.0 in | Wt 239.0 lb

## 2022-06-30 DIAGNOSIS — I10 Essential (primary) hypertension: Secondary | ICD-10-CM | POA: Diagnosis not present

## 2022-06-30 DIAGNOSIS — G4733 Obstructive sleep apnea (adult) (pediatric): Secondary | ICD-10-CM | POA: Diagnosis not present

## 2022-06-30 NOTE — Progress Notes (Signed)
Virtual Visit via Video Note   This visit type was conducted due to national recommendations for restrictions regarding the COVID-19 Pandemic (e.g. social distancing) in an effort to limit this patient's exposure and mitigate transmission in our community.  Due to his co-morbid illnesses, this patient is at least at moderate risk for complications without adequate follow up.  This format is felt to be most appropriate for this patient at this time.  All issues noted in this document were discussed and addressed.  A limited physical exam was performed with this format.  Please refer to the patient's chart for his consent to telehealth for [facility].   Date:  12/28/2021   ID:  Peter Russo, DOB July 25, 1976, MRN [id number] The patient was identified using 2 identifiers.  Patient Location: Home Provider Location: Office/Clinic   Date:  06/30/2022   ID:  Peter Russo, DOB 1976-04-25, MRN [id number]  PCP:  Maximiano Coss, NP  Cardiologist:  Jenkins Rouge, MD  Sleep Medicine:  Fransico Him, MD Electrophysiologist:  None   Chief Complaint:  OSA  History of Present Illness:     This is a 46yo male with a hx of ASCAD, HTN, ischemic DCM and chronic CHF who was referred by Dr. Caryl Comes for evaluation of OSA due to CHF, obesity and snoring.  He was found to have severe OSA with an AHI of 66.5/hr with loud snoring, mild central sleep apnea (CAI 8/hr) and O2 sats as low as 82%.  He underwent CPAP titration but due to ongoing respiratory events, was changed to BiPAP at 16/12cm H2O.    He has had a lot of problems tolerating the PAP mask.  He felt like he was suffocating with the mask.  He switched to a nasal mask but it was the same problem.  He is a mouth breather.  I referred him to his DME for a mask desensitization but was noncompliant and had to turn his device back in.  In order to get a new device he had to undergo a repeat split-night sleep study which showed severe obstructive sleep  apnea with an AHI 30.2/h and O2 saturations as low as 85%.  He was titrated to 12 cm H2O.    He is doing well with his PAP device and thinks that he has gotten used to it.  He tolerates the nasal pillow mask and feels the pressure is adequate.  Since going on PAP he feels rested in the am and has no significant daytime sleepiness unless he sits down for a long time.  He denies any significant mouth or nasal dryness or nasal congestion.  He does not think that he snores.    Prior CV studies:   The following studies were reviewed today:  PAP download compliance   Past Medical History:  Diagnosis Date   Anxiety    CAD S/P percutaneous coronary angioplasty    a. s/p BMS-mLAD 2010. b. DES to RCA 2012. c. 04/2015: DES to LCx and PCI to LAD c/b dissection with subsequent overlapping DES to mLAD - r/i for NSTEMI afterwards; d. 08/2015 Ant STEMI in setting of noncompliance->126mAD ISR (3.0x16 Synergy DES); e. 03/2016 PCI of apical LAD; f. 03/2016 Relook Cath: patent LAD stents, RCA 100p CTO-->Med Rx; g. 10/2016 Ant STEMI: PTCA 100p LAD.   [place]ronic combined systolic and diastolic [place] ([place]    a. 03/2016 Echo: EF 30-35%, Gr2 DD;  b. 08/2016 Echo: EF 40%.   Depression  Essential hypertension    GAD (generalized anxiety disorder)    GERD (gastroesophageal reflux disease)    Hypercholesteremia    Ischemic cardiomyopathy    a. prior EF 25-35 percent at cath, 35-40 percent by echo; b. 03/2016 Echo: EF 30-35%, diff HK, Gr2 DD; c. 08/2016 Echo: EF 40%, base/mid inferior/inferowseptal AK, mildly dil LA.   Morbid obesity ([place])    Pre-diabetes    SVT (supraventricular tachycardia) APRROX 10 YRS AGO   PRESUMED AVNRT, ECHO 12/07 WITH EF 65%, MILD LAE   Tobacco abuse    a. 20 + pack years, quit 10/2016.   Past Surgical History:  Procedure Laterality Date   CARDIAC CATHETERIZATION N/A 04/23/2015   Procedure: Left Heart Cath and Coronary Angiography;  Surgeon: Josue Hector, MD;  Location:  [place] [facility];  Service: Cardiovascular;  Laterality: N/A;   CARDIAC CATHETERIZATION N/A 04/23/2015   Procedure: Coronary Stent Intervention;  Surgeon: Sherren Mocha, MD;  Location: [place] [facility];  Service: Cardiovascular;  Laterality: N/A;   CARDIAC CATHETERIZATION N/A 09/14/2015   Procedure: Left Heart Cath and Coronary Angiography;  Surgeon: Belva Crome, MD;  LAD 100%, CFX 10% ISR, RCA 100% (chronic), EF 25-35%   CARDIAC CATHETERIZATION N/A 09/14/2015   Procedure: Coronary Stent Intervention;  Surgeon: Belva Crome, MD;  Location: [place] [facility];  Service: Cardiovascular;  Laterality: N/A; 3.0 x 16 mm Synergy DES LAD   CARDIAC CATHETERIZATION  03/16/2016   CARDIAC CATHETERIZATION N/A 03/16/2016   Procedure: Left Heart Cath and Coronary Angiography;  Surgeon: Belva Crome, MD;  Location: [place] [facility];  Service: Cardiovascular;  Laterality: N/A;   CARDIAC CATHETERIZATION N/A 03/16/2016   Procedure: Coronary Balloon Angioplasty;  Surgeon: Belva Crome, MD;  Location: [place] [facility];  Service: Cardiovascular;  Laterality: N/A;   CARDIAC CATHETERIZATION N/A 03/25/2016   Procedure: Left Heart Cath and Coronary Angiography;  Surgeon: Peter M Martinique, MD;  Location: [place] [facility];  Service: Cardiovascular;  Laterality: N/A;   CORONARY ANGIOPLASTY WITH STENT PLACEMENT  2010; 2013   CORONARY BALLOON ANGIOPLASTY N/A 10/19/2016   Procedure: Coronary Balloon Angioplasty;  Surgeon: Jettie Booze, MD;  Location: [place] [facility];  Service: Cardiovascular;  Laterality: N/A;   INTRAVASCULAR ULTRASOUND/IVUS N/A 10/19/2016   Procedure: Intravascular Ultrasound/IVUS;  Surgeon: Jettie Booze, MD;  Location: [place] [facility];  Service: Cardiovascular;  Laterality: N/A;   LEFT HEART CATH AND CORONARY ANGIOGRAPHY N/A 10/19/2016   Procedure: Left Heart Cath and Coronary Angiography;  Surgeon: Jettie Booze, MD;  Location: [place] [facility];  Service: Cardiovascular;   Laterality: N/A;   RIGHT/LEFT HEART CATH AND CORONARY ANGIOGRAPHY N/A 05/21/2020   Procedure: RIGHT/LEFT HEART CATH AND CORONARY ANGIOGRAPHY;  Surgeon: Larey Dresser, MD;  Location: [place] [facility];  Service: Cardiovascular;  Laterality: N/A;     Current Meds  Medication Sig   acetaminophen (TYLENOL) 325 MG tablet Take 2 tablets (650 mg total) by mouth every 4 (four) hours as needed for headache or mild pain.   aspirin 81 MG EC tablet Take 1 tablet (81 mg total) by mouth daily.   atorvastatin (LIPITOR) 80 MG tablet TAKE 1 TABLET BY MOUTH EVERY DAY AT 6 PM   blood glucose meter kit and supplies Dispense based on patient and insurance preference. Use up to four times daily as directed. (FOR ICD-10 E10.9, E11.9).   buPROPion (WELLBUTRIN XL) 150 MG 24 hr tablet TAKE 1 TABLET BY  MOUTH EVERY DAY   carvedilol (COREG) 12.5 MG tablet Take 1.5 tablets (18.75 mg total) by mouth 2 (two) times daily.   clotrimazole-betamethasone (LOTRISONE) cream Apply 1 application topically as needed.   dapagliflozin propanediol (FARXIGA) 10 MG TABS tablet Take 1 tablet (10 mg total) by mouth daily before breakfast.   digoxin (LANOXIN) 0.125 MG tablet Take 1 tablet (125 mcg total) by mouth daily.   Evolocumab (REPATHA SURECLICK) 962 MG/ML SOAJ INJECT 1 PEN INTO THE SKIN EVERY 14 (FOURTEEN) DAYS.   fenofibrate 160 MG tablet Take 1 tablet (160 mg total) by mouth daily.   furosemide (LASIX) 40 MG tablet Take 1 tablet (40 mg total) by mouth daily.   icosapent Ethyl (VASCEPA) 1 g capsule Take 2 capsules (2 g total) by mouth 2 (two) times daily.   insulin glargine (LANTUS SOLOSTAR) 100 UNIT/ML Solostar Pen Inject 20 Units into the skin daily.   Insulin Pen Needle (PEN NEEDLES 3/16") 31G X 5 MM MISC 1 Device by Other route daily in the afternoon. Lantus 23   isosorbide-hydrALAZINE (BIDIL) 20-37.5 MG tablet Take 1 tablet by mouth 3 (three) times daily.   metFORMIN (GLUCOPHAGE-XR) 500 MG 24 hr tablet Take 1 tablet (500 mg  total) by mouth 2 (two) times daily.   nitroGLYCERIN (NITROSTAT) 0.4 MG SL tablet Place 1 tablet (0.4 mg total) under the tongue every 5 (five) minutes as needed for chest pain.   potassium chloride SA (KLOR-CON M) 20 MEQ tablet Take 2 tablets (40 mEq total) by mouth daily.   sacubitril-valsartan (ENTRESTO) 97-103 MG Take 1 tablet by mouth 2 (two) times daily.   Semaglutide, 2 MG/DOSE, (OZEMPIC, 2 MG/DOSE,) 8 MG/3ML SOPN Inject 2 mg into the skin once a week.   spironolactone (ALDACTONE) 25 MG tablet Take 1 tablet (25 mg total) by mouth daily.   triamcinolone cream (KENALOG) 0.1 % Apply 1 application topically as needed.     Allergies:   Patient has no known allergies.   Social History   Tobacco Use   Smoking status: Former    Packs/day: 1.00    Years: 24.00    Total pack years: 24.00    Types: Cigarettes    Quit date: 11/03/2016    Years since quitting: 5.6   Smokeless tobacco: Never   Tobacco comments:    quit on 03/15/16  Vaping Use   Vaping Use: Never used  Substance Use Topics   Alcohol use: No   Drug use: Yes    Types: Marijuana    Comment: last time 02/2016     Family Hx: The patient's family history includes Arrhythmia in his mother; Coronary artery disease in his mother; Coronary artery disease (age of onset: 46) in his father; Heart attack in his brother, father, and mother; Heart disease in his father; Hypertension in his mother. There is no history of Stroke.  ROS:   Please see the history of present illness.     All other systems reviewed and are negative.   Labs/Other Tests and Data Reviewed:    Recent Labs: 01/11/2022: ALT 21 03/11/2022: B Natriuretic Peptide 87.9 04/04/2022: BUN 18; Creatinine, Ser 1.32; Potassium 3.8; Sodium 141   Recent Lipid Panel Lab Results  Component Value Date/Time   CHOL 107 01/11/2022 11:04 AM   CHOL 121 02/01/2021 12:24 PM   TRIG 96 01/11/2022 11:04 AM   HDL 45 01/11/2022 11:04 AM   HDL 47 02/01/2021 12:24 PM   CHOLHDL 2.4  01/11/2022 11:04 AM   LDLCALC 43  01/11/2022 11:04 AM   LDLCALC 47 02/01/2021 12:24 PM   LDLDIRECT 51 02/01/2021 12:24 PM    Wt Readings from Last 3 Encounters:  06/30/22 239 lb (108.4 kg)  04/15/22 239 lb (108.4 kg)  04/04/22 239 lb (108.4 kg)    Objective:    Vital Signs:  Ht _0  (1.702 m)   Wt 239 lb (108.4 kg)   BMI 37.43 kg/m   Well nourished, well developed male in no acute distress. Well appearing, alert and conversant, regular work of breathing,  good skin color  Eyes- anicteric mouth- oral mucosa is pink  neuro- grossly intact skin- no apparent rash or lesions or cyanosis ASSESSMENT & PLAN:    OSA - The patient is tolerating PAP therapy well without any problems. The PAP download performed by his DME was personally reviewed and interpreted by me today and showed an AHI of 1.3/hr on 12 cm H2O with 40% compliance in using more than 4 hours nightly.  The patient has been using and benefiting from PAP use and will continue to benefit from therapy.  -I encouraged him to be more compliant with his device>>he admits to missing some nights -encouraged him to talk with his DME about getting a special sleep pillow to help keep his mask in position when sleeping supine    HTN -BP  controlled at last OV with Dr. Aundra Dubin -continue prescription drug management with Spironolactone 61m daily, Carvedilol 12.512mBID and Entresto 49-5136mID with PRN refills  Medication Adjustments/Labs and Tests Ordered: Current medicines are reviewed at length with the patient today.  Concerns regarding medicines are outlined above.  Tests Ordered: No orders of the defined types were placed in this encounter.  Medication Changes: No orders of the defined types were placed in this encounter.   Disposition:  Follow up with me in [id number], TracFransico Him  06/30/2022 9:30 AM    [facility]

## 2022-07-05 ENCOUNTER — Telehealth (HOSPITAL_COMMUNITY): Payer: Self-pay

## 2022-07-05 ENCOUNTER — Encounter (HOSPITAL_COMMUNITY): Payer: Self-pay

## 2022-07-05 ENCOUNTER — Ambulatory Visit (HOSPITAL_COMMUNITY)
Admission: RE | Admit: 2022-07-05 | Discharge: 2022-07-05 | Disposition: A | Discharge status: Home / Self Care | Payer: Medicare Other | Source: Ambulatory Visit | Attending: Family Medicine | Admitting: Family Medicine

## 2022-07-05 VITALS — BP 142/88 | HR 80 | Wt 241.6 lb

## 2022-07-05 DIAGNOSIS — E118 Type 2 diabetes mellitus with unspecified complications: Secondary | ICD-10-CM

## 2022-07-05 DIAGNOSIS — F17210 Nicotine dependence, cigarettes, uncomplicated: Secondary | ICD-10-CM | POA: Diagnosis not present

## 2022-07-05 DIAGNOSIS — G4733 Obstructive sleep apnea (adult) (pediatric): Secondary | ICD-10-CM

## 2022-07-05 DIAGNOSIS — E669 Obesity, unspecified: Secondary | ICD-10-CM | POA: Diagnosis not present

## 2022-07-05 DIAGNOSIS — I2510 Atherosclerotic heart disease of native coronary artery without angina pectoris: Secondary | ICD-10-CM | POA: Diagnosis not present

## 2022-07-05 DIAGNOSIS — E119 Type 2 diabetes mellitus without complications: Secondary | ICD-10-CM | POA: Diagnosis not present

## 2022-07-05 DIAGNOSIS — I25119 Atherosclerotic heart disease of native coronary artery with unspecified angina pectoris: Secondary | ICD-10-CM | POA: Diagnosis present

## 2022-07-05 DIAGNOSIS — I5022 Chronic systolic (congestive) heart failure: Secondary | ICD-10-CM

## 2022-07-05 DIAGNOSIS — E782 Mixed hyperlipidemia: Secondary | ICD-10-CM

## 2022-07-05 DIAGNOSIS — Z6837 Body mass index (BMI) 37.0-37.9, adult: Secondary | ICD-10-CM | POA: Insufficient documentation

## 2022-07-05 DIAGNOSIS — F17200 Nicotine dependence, unspecified, uncomplicated: Secondary | ICD-10-CM

## 2022-07-05 DIAGNOSIS — I255 Ischemic cardiomyopathy: Secondary | ICD-10-CM | POA: Diagnosis not present

## 2022-07-05 DIAGNOSIS — E785 Hyperlipidemia, unspecified: Secondary | ICD-10-CM | POA: Insufficient documentation

## 2022-07-05 DIAGNOSIS — Z794 Long term (current) use of insulin: Secondary | ICD-10-CM

## 2022-07-05 DIAGNOSIS — Z79899 Other long term (current) drug therapy: Secondary | ICD-10-CM | POA: Insufficient documentation

## 2022-07-05 DIAGNOSIS — I272 Pulmonary hypertension, unspecified: Secondary | ICD-10-CM | POA: Diagnosis not present

## 2022-07-05 DIAGNOSIS — I110 Hypertensive heart disease with heart failure: Secondary | ICD-10-CM | POA: Insufficient documentation

## 2022-07-05 LAB — BASIC METABOLIC PANEL
Anion gap: 11 (ref 5–15)
BUN: 13 mg/dL (ref 6–20)
CO2: 26 mmol/L (ref 22–32)
Calcium: 9.7 mg/dL (ref 8.9–10.3)
Chloride: 103 mmol/L (ref 98–111)
Creatinine, Ser: 1.3 mg/dL — ABNORMAL HIGH (ref 0.61–1.24)
GFR, Estimated: >60 mL/min (ref >60)
Glucose, Bld: 88 mg/dL (ref 70–99)
Potassium: 4.3 mmol/L (ref 3.5–5.1)
Sodium: 140 mmol/L (ref 135–145)

## 2022-07-05 LAB — DIGOXIN LEVEL: Digoxin Level: <0.2 ng/mL — ABNORMAL LOW (ref 0.8–2.0)

## 2022-07-05 MED ORDER — CARVEDILOL 25 MG PO TABS: 25.0000 mg | ORAL_TABLET | Freq: Two times a day (BID) | ORAL | 3 refills | Status: DC

## 2022-07-05 NOTE — Patient Instructions (Signed)
INCREASE Coreg to 25 mg one tab twice a day  Labs today We will only contact you if something comes back abnormal or we need to make some changes. Otherwise no news is good news!  Your physician wants you to follow-up in: 2-3 months with Dr Shirlee Latch. You will receive a reminder letter in the mail two months in advance. If you don't receive a letter, please call our office to schedule the follow-up appointment.  Do the following things EVERYDAY: Weigh yourself in the morning before breakfast. Write it down and keep it in a log. Take your medicines as prescribed Eat low salt foods--Limit salt (sodium) to 2000 mg per day.  Stay as active as you can everyday Limit all fluids for the day to less than 2 liters  At [facility], you and your health needs are our priority. As part of our continuing mission to provide you with exceptional heart care, we have created designated Provider Care Teams. These Care Teams include your primary Cardiologist (physician) and Advanced Practice Providers (APPs- Physician Assistants and Nurse Practitioners) who all work together to provide you with the care you need, when you need it.   You may see any of the following providers on your designated Care Team at your next follow up: Dr Arvilla Meres Dr Marca Ancona Dr. Marcos Eke, NP Robbie Lis, [place] [facility] [place], [place] Brynda Peon, NP Karle Plumber, PharmD   Please be sure to bring in all your medications bottles to every appointment.   If you have any questions or concerns before your next appointment please send [place] a message through [place] or call our office at [phone].    TO LEAVE A MESSAGE FOR THE NURSE SELECT OPTION 2, PLEASE LEAVE A MESSAGE INCLUDING: YOUR NAME DATE OF BIRTH CALL BACK NUMBER REASON FOR CALL**this is important as we prioritize the call backs  YOU WILL RECEIVE A CALL BACK THE SAME DAY AS LONG AS YOU CALL BEFORE 4:00  PM

## 2022-07-05 NOTE — Progress Notes (Signed)
PCP: Maximiano Coss, NP Cardiology: Dr. Johnsie Cancel HF Cardiology: Dr. Aundra Dubin  46 y.o. with history of CAD s/p multiple MIs and PCIs, ischemic cardiomyopathy, HTN, active smoking, and diabetes.  Patient has had multiple coronary interventions, fist in 2010.  His RCA is now chronically occluded, and he has had many stents in the LAD.  Echo in 1/18 showed EF 40%.  EF was down to 20-25% in 12/19.  Echo in 7/21 was reported as EF 25-30%, moderate LV enlargement, normal RV.  Of note, patient seen in urgent care in 7/21 and noted to have blood glucose > 400.  HgbA1c was 11.3. He was started on metformin at urgent care with new diagnosis of diabetes.     Given more frequent chest pain and dyspnea with moderate exertion, I took him for LHC/RHC in 10/21.  This showed low output HF with CI 1.48.  His LAD was totally occluded (long segment) with no collaterals, the RCA was totally occluded with R-R collaterals, and the LCx was patent.  He was admitted and started on milrinone, diuresed.  He was weaned off milrinone.  Lifevest was recommended but he refused.  He was seen by cardiac surgery, not a candidate for CABG.  We began LVAD evaluation (active smoker, not transplant candidate).  Echo in 10/21 showed EF 20-25% with severe LV dilation.  CPX (10/21) showed peak VO2 16, VE/VCO2 slope 37, RER 1.13 => moderate HF limitation.   Echo in 8/22 showed EF 20-25%, moderate LV dilation, mildly decreased RV systolic function. CPX in 7/23 showed moderate-severe functional limitation, somewhat worse than 10/21.   Echo 9/23 showed EF remains < 20%, severe LV dysfunction with global HK, grade III DD, RV mildly reduced.  Today he returns for HF follow up. Overall feeling OK. He is not short of breath with stairs or lifting groceries. Not very physically active. Denies palpitations, abnormal bleeding, CP, dizziness, edema, or PND/Orthopnea. Appetite ok. No fever or chills. Weight at home 240 pounds. Taking all medications.  Back  smoking occasionally. Tearful about cardiac issues, remains undecided about ICD. Has seen counselor.  Labs (7/21): K 5, creatinine 1.11, glucose 464, hgbA1 11.3 Labs (9/21): LDL 175 Labs (10/21): K 3.9, creatinine 1.0, hgbA1c 12.2 => 11.5 Labs (11/21): K 3.7, creatinine 1.05 Labs (12/21): K 4, creatinine 1.22 Labs (1/22): digoxin 0.2, hgbA1c 6.5, K 4.1, creatinine 1.16 Labs (3/22): LDL 16, TGs 671 Labs (6/22): LDL 51, TGs 161, HDL 47 Labs (8/22): K 4.2, creatinine 1.4 Labs (5/23): K 4.2, creatinine 1.37, BNP 132, digoxin < 0.2, LDL 43, TGs 56 Labs (8/23): K 3.8, creatinine 1.32  ECG (personally reviewed): none ordered today.  PMH: 1. CAD:  - BMS to LAD in 2010.  - DES to RCA on 2012 - DES to LCx and LAD in 7/04, complicated by dissection with overlapping DES to mid LAD.  - Anterior STEMI in 1/17 with DES to totally occluded LAD (stent thrombosis).  - 8/17 PCI to apical LAD, RCA noted to be totally occluded with collaterals.  - 3/18 anterior STEMI with PTCA to totally occluded LAD.  - Coronary angiography (10/21): LAD was totally occluded (long segment) with no collaterals, the RCA was totally occluded with R-R collaterals, and the LCx was patent 2. Chronic systolic CHF: Ischemic cardiomyopathy.   - Echo (1/18): EF 40%.  - Echo (12/19): EF 20-25% - Echo (7/21): EF 25-30%, moderate LV enlargement, mild LVH, RV normal.  - RHC (10/21): mean RA 6, PA 47/13, mean PCWP 20, CI 1.48,  PVR 2.4 WU - CPX (10/21): peak VO2 16, VE/VCO2 slope 37, RER 1.13 => moderate HF limitation - Echo (8/22): EF 20-25%, moderate LV dilation, mildly decreased RV systolic function - CPX (0/07): VO2 max 13.7, VE/VOC2 slope 41, RER 1.10 => moderate to severe limitation due to body habitus and CHF.  - Echo (9/23): EF < 20%, severe LV dysfunction with global HK, grade III DD, RV mildly reduced. 3. HTN 4. H/o AVNRT 5. Smoking: Active smoker, 1-2 cigs/day.  - PFTs in 10/21 with minimal obstructive disease.  6. OSA:  Severe, on CPAP 7. Type 2 diabetes: Diagnosed in 7/21 8. Carotid dopplers (10/21): 1-39% BICA stenosis.  9. PAD: ABIs (10/21) were mildly decreased.  10. Hyperlipidemia  Social History   Socioeconomic History   Marital status: Significant Other    Spouse name: Not on file   Number of children: 3   Years of education: Not on file   Highest education level: Not on file  Occupational History   Occupation: Aeronautical engineer: [employer]  Tobacco Use   Smoking status: Former    Packs/day: 1.00    Years: 24.00    Total pack years: 24.00    Types: Cigarettes    Quit date: 11/03/2016    Years since quitting: 5.6   Smokeless tobacco: Never   Tobacco comments:    quit on 03/15/16  Vaping Use   Vaping Use: Never used  Substance and Sexual Activity   Alcohol use: No   Drug use: Yes    Types: Marijuana    Comment: last time 02/2016   Sexual activity: Yes  Other Topics Concern   Not on file  Social History Narrative   Lives in [place] by himself.  Sometimes stays with girlfriend in [place].  Does not routinely exercise.   Social Determinants of Health   Financial Resource Strain: Not on file  Food Insecurity: Not on file  Transportation Needs: Not on file  Physical Activity: Not on file  Stress: Not on file  Social Connections: Not on file  Intimate Partner Violence: Not on file   Family History  Problem Relation Age of Onset   Arrhythmia Mother        MOTHER LIVED TO BE 102   Coronary artery disease Mother    Heart attack Mother    Hypertension Mother    Coronary artery disease Father 33   Heart disease Father    Heart attack Father    Heart attack Brother    Stroke Neg Hx    ROS: All systems reviewed and negative except as per HPI.   Current Outpatient Medications  Medication Sig Dispense Refill   acetaminophen (TYLENOL) 325 MG tablet Take 2 tablets (650 mg total) by mouth every 4 (four) hours as needed for headache or mild pain.     aspirin 81 MG EC tablet  Take 1 tablet (81 mg total) by mouth daily. 90 tablet 3   atorvastatin (LIPITOR) 80 MG tablet TAKE 1 TABLET BY MOUTH EVERY DAY AT 6 PM 90 tablet 3   blood glucose meter kit and supplies Dispense based on patient and insurance preference. Use up to four times daily as directed. (FOR ICD-10 E10.9, E11.9). 1 each 0   buPROPion (WELLBUTRIN XL) 150 MG 24 hr tablet TAKE 1 TABLET BY MOUTH EVERY DAY 90 tablet 3   carvedilol (COREG) 12.5 MG tablet Take 1.5 tablets (18.75 mg total) by mouth 2 (two) times daily. 180 tablet 3  clotrimazole-betamethasone (LOTRISONE) cream Apply 1 application topically as needed.     dapagliflozin propanediol (FARXIGA) 10 MG TABS tablet Take 1 tablet (10 mg total) by mouth daily before breakfast. 90 tablet 3   digoxin (LANOXIN) 0.125 MG tablet Take 1 tablet (125 mcg total) by mouth daily. 90 tablet 3   Evolocumab (REPATHA SURECLICK) 277 MG/ML SOAJ INJECT 1 PEN INTO THE SKIN EVERY 14 (FOURTEEN) DAYS. 2 mL 11   fenofibrate 160 MG tablet Take 1 tablet (160 mg total) by mouth daily. 90 tablet 3   furosemide (LASIX) 40 MG tablet Take 1 tablet (40 mg total) by mouth daily. 30 tablet 6   icosapent Ethyl (VASCEPA) 1 g capsule Take 2 capsules (2 g total) by mouth 2 (two) times daily. 120 capsule 11   insulin glargine (LANTUS SOLOSTAR) 100 UNIT/ML Solostar Pen Inject 20 Units into the skin daily. 15 mL 6   Insulin Pen Needle (PEN NEEDLES 3/16") 31G X 5 MM MISC 1 Device by Other route daily in the afternoon. Lantus 23 100 each 3   isosorbide-hydrALAZINE (BIDIL) 20-37.5 MG tablet Take 1 tablet by mouth 3 (three) times daily. 90 tablet 1   metFORMIN (GLUCOPHAGE-XR) 500 MG 24 hr tablet Take 1 tablet (500 mg total) by mouth 2 (two) times daily. 180 tablet 3   nitroGLYCERIN (NITROSTAT) 0.4 MG SL tablet Place 1 tablet (0.4 mg total) under the tongue every 5 (five) minutes as needed for chest pain. 25 tablet 3   potassium chloride SA (KLOR-CON M) 20 MEQ tablet Take 2 tablets (40 mEq total) by  mouth daily. 180 tablet 3   sacubitril-valsartan (ENTRESTO) 97-103 MG Take 1 tablet by mouth 2 (two) times daily. 60 tablet 11   Semaglutide, 2 MG/DOSE, (OZEMPIC, 2 MG/DOSE,) 8 MG/3ML SOPN Inject 2 mg into the skin once a week. 9 mL 3   spironolactone (ALDACTONE) 25 MG tablet Take 1 tablet (25 mg total) by mouth daily. 90 tablet 3   triamcinolone cream (KENALOG) 0.1 % Apply 1 application topically as needed.     No current facility-administered medications for this encounter.   Wt Readings from Last 3 Encounters:  07/05/22 109.6 kg (241 lb 9.6 oz)  06/30/22 108.4 kg (239 lb)  04/15/22 108.4 kg (239 lb)   BP (!) 142/88   Pulse 80   Wt 109.6 kg (241 lb 9.6 oz)   SpO2 99%   BMI 37.84 kg/m  Physical Exam General:  NAD. No resp difficulty HEENT: Normal Neck: Supple. No JVD. Carotids 2+ bilat; no bruits. No lymphadenopathy or thryomegaly appreciated. Cor: PMI nondisplaced. Regular rate & rhythm. No rubs, gallops or murmurs. Lungs: Clear Abdomen: Soft, nontender, nondistended. No hepatosplenomegaly. No bruits or masses. Good bowel sounds. Extremities: No cyanosis, clubbing, rash, edema Neuro: Alert & oriented x 3, cranial nerves grossly intact. Moves all 4 extremities w/o difficulty. Tearful  Assessment/Plan: 1. CAD: RCA known to be chronically occluded.  He has had multiple PCIs to LAD.  Repeat LHC in 10/21 done for recurrent angina demonstrated occluded RCA (known from past) and occluded pLAD with no collaterals. His LCx was patent. I do not think we have a revascularization option, multiple overlapping LAD stents, now occluded and there are no good surgical targets. No recent chest pain.   - Continue ASA 81  - Continue atorvastatin 80  2. Chronic systolic CHF: Ischemic cardiomyopathy.  Echo in 10/21 with EF 20-25%, severe LV dilation, mildly decreased RV systolic function.  RHC in 10/21 demonstrated elevated PCWP, mild  pulmonary venous hypertension and low cardiac output with CI 1.4.  Patient was transiently on milrinone but weaned off. CPX in 10/21 showed peak VO2 16, VE/VCO2 slope 37, RER 1.13 => moderate HF limitation.  Echo in 8/22 showed EF 20-25%.  CPX was worse in 7/23 with moderate-severe functional limitation. Echo 9/23 showed EF < 20%.  Despite the CPX and severely decreased LV function, he reports doing fairly well with symptomatically with no significant exertional dyspnea.  He describes NYHA class I-II.  He is not volume overloaded on exam.   - Increase Coreg to 25 mg bid. - Continue Lasix 40 mg daily.   BMET/BNP today.  - Continue Entresto 97/103 bid.  - Continue spironolactone 25 mg daily  - Continue digoxin 0.125 daily, check level.   - Continue Farxiga 10 mg daily.    - Continue Bidil 1 tab tid.  - He has had LVAD workup in the past.  He had low CO on RHC and in 7/23 had CPX showing moderate-severe functional limitation though symptomatically he says he is doing very well, describes NYHA class 1-II.  With stable symptoms, we have held off on advanced therapies for now.  Transplant would be a better option than LVAD if something is needed given his age, weight is improving and his diabetes is under better control. He previously quit smoking for > 6 months, but has picked back up. Discussed that he needs to be quit x 6 months before transplant consideration. - He is not a CRT candidate. He has seen Dr. Caryl Comes regarding ICD; I recommended again that he go ahead and have ICD placed, but he is nervous about this and still wants to think more about it (same as last appointment).  3. Type 2 diabetes: New diagnosis in 7/21 with hgbA1c 11.3.  Glucose now under better control. - Continue Farxiga 10 mg daily.  4. OSA: Severe OSA, uses CPAP.  5. Smoking:  He quit in 10/21.  PFTs with minimal obstructive disease. Recently started back smoking. Discussion cessation. - Continue Wellbutrin 6. Hyperlipidemia: LDL is at goal on Repatha and atorvastatin.  TGs high when last checked,  now on Vascepa and fenofibrate.  Followed by lipid clinic. Good lipids in 5/23. 7. Obesity: Body mass index is 37.84 kg/m. Weight coming down on semaglutide.  - Continue efforts at diet and exercise.   Follow up in 3 months with Dr. Aundra Dubin.   Maricela Bo [facility] FNP-BC 07/05/2022

## 2022-07-05 NOTE — Telephone Encounter (Signed)
Called to confirm/remind patient of their appointment at the [facility] on 07/05/22.   Patient reminded to bring all medications and/or complete list.  Confirmed patient has transportation. Gave directions, instructed to utilize valet parking.  Confirmed appointment prior to ending call.   

## 2022-07-18 ENCOUNTER — Ambulatory Visit: Payer: Medicare Other | Admitting: Physician Assistant

## 2022-07-18 NOTE — Progress Notes (Deleted)
Peter Russo,Peter Russo,acting as a Education administrator for [employer], PA-C.,have documented all relevant documentation on the behalf of Peter Speak, PA-C,as directed by  [employer], PA-C while in the presence of [employer], PA-C.  New patient visit   Patient: Peter Russo   DOB: 1976-04-06   46 y.o. Male  MRN: [id number] Visit Date: 07/18/2022  Today's healthcare provider: [name], PA-C   No chief complaint on file.  Subjective    [name] is a 46 y.o. male who presents today as a new patient to establish care.  HPI  ***  Past Medical History:  Diagnosis Date   Anxiety    CAD S/P percutaneous coronary angioplasty    a. s/p BMS-mLAD 2010. b. DES to RCA 2012. c. 04/2015: DES to LCx and PCI to LAD c/b dissection with subsequent overlapping DES to mLAD - r/Peter Russo for NSTEMI afterwards; d. 08/2015 Ant STEMI in setting of noncompliance->133mAD ISR (3.0x16 Synergy DES); e. 03/2016 PCI of apical LAD; f. 03/2016 Relook Cath: patent LAD stents, RCA 100p CTO-->Med Rx; g. 10/2016 Ant STEMI: PTCA 100p LAD.   Chronic combined systolic and diastolic CHF (congestive heart failure) ([facility]    a. 03/2016 Echo: EF 30-35%, Gr2 DD;  b. 08/2016 Echo: EF 40%.   Depression    Essential hypertension    GAD (generalized anxiety disorder)    GERD (gastroesophageal reflux disease)    Hypercholesteremia    Ischemic cardiomyopathy    a. prior EF 25-35 percent at cath, 35-40 percent by echo; b. 03/2016 Echo: EF 30-35%, diff HK, Gr2 DD; c. 08/2016 Echo: EF 40%, base/mid inferior/inferowseptal AK, mildly dil LA.   Morbid obesity [place]    Pre-diabetes    SVT (supraventricular tachycardia) APRROX 10 YRS AGO   PRESUMED AVNRT, ECHO 12/07 WITH EF 65%, MILD LAE   Tobacco abuse    a. 20 + pack years, quit 10/2016.   Past Surgical History:  Procedure Laterality Date   CARDIAC CATHETERIZATION N/A 04/23/2015   Procedure: Left Heart Cath and Coronary Angiography;  Surgeon: PJosue Hector MD;  [facility];  Service: Cardiovascular;  Laterality: N/A;   CARDIAC CATHETERIZATION N/A 04/23/2015   Procedure: Coronary Stent Intervention;  Surgeon: MSherren Mocha MD;  [facility];  Service: Cardiovascular;  Laterality: N/A;   CARDIAC CATHETERIZATION N/A 09/14/2015   Procedure: Left Heart Cath and Coronary Angiography;  Surgeon: HBelva Crome MD;  LAD 100%, CFX 10% ISR, RCA 100% (chronic), EF 25-35%   CARDIAC CATHETERIZATION N/A 09/14/2015   Procedure: Coronary Stent Intervention;  Surgeon: HBelva Crome MD;  [facility];  Service: Cardiovascular;  Laterality: N/A; 3.0 x 16 mm Synergy DES LAD   CARDIAC CATHETERIZATION  03/16/2016   CARDIAC CATHETERIZATION N/A 03/16/2016   Procedure: Left Heart Cath and Coronary Angiography;  Surgeon: HBelva Crome MD;  [facility];  Service: Cardiovascular;  Laterality: N/A;   CARDIAC CATHETERIZATION N/A 03/16/2016   Procedure: Coronary Balloon Angioplasty;  Surgeon: HBelva Crome MD;  [facility];  Service: Cardiovascular;  Laterality: N/A;   CARDIAC CATHETERIZATION N/A 03/25/2016   Procedure: Left Heart Cath and Coronary Angiography;  Surgeon: Peter M JMartinique MD;  [facility];  Service: Cardiovascular;  Laterality: N/A;   CORONARY ANGIOPLASTY WITH STENT PLACEMENT  2010; 2013   CORONARY BALLOON ANGIOPLASTY N/A 10/19/2016   Procedure: Coronary Balloon Angioplasty;  Surgeon: JJettie Booze MD;  Location: [place] [facility];  Service: Cardiovascular;  Laterality: N/A;   INTRAVASCULAR ULTRASOUND/IVUS N/A 10/19/2016   Procedure: Intravascular Ultrasound/IVUS;  Surgeon: Jettie Booze, MD;  Location: [place] [facility];  Service: Cardiovascular;  Laterality: N/A;   LEFT HEART CATH AND CORONARY ANGIOGRAPHY N/A 10/19/2016   Procedure: Left Heart Cath and Coronary Angiography;  Surgeon: Jettie Booze, MD;  Location: [place] [facility];  Service: Cardiovascular;  Laterality: N/A;    RIGHT/LEFT HEART CATH AND CORONARY ANGIOGRAPHY N/A 05/21/2020   Procedure: RIGHT/LEFT HEART CATH AND CORONARY ANGIOGRAPHY;  Surgeon: Larey Dresser, MD;  Location: [place] [facility];  Service: Cardiovascular;  Laterality: N/A;   Family Status  Relation Name Status   Mother  Alive   Father  Deceased   Brother  (Not Specified)   MGM  Deceased   MGF  Deceased   PGM  Deceased   PGF  Deceased   Neg Hx  (Not Specified)   Family History  Problem Relation Age of Onset   Arrhythmia Mother        MOTHER LIVED TO BE 102   Coronary artery disease Mother    Heart attack Mother    Hypertension Mother    Coronary artery disease Father 49   Heart disease Father    Heart attack Father    Heart attack Brother    Stroke Neg Hx    Social History   Socioeconomic History   Marital status: Significant Other    Spouse name: Not on file   Number of children: 3   Years of education: Not on file   Highest education level: Not on file  Occupational History   Occupation: Aeronautical engineer: [employer]  Tobacco Use   Smoking status: Former    Packs/day: 1.00    Years: 24.00    Total pack years: 24.00    Types: Cigarettes    Quit date: 11/03/2016    Years since quitting: 5.7   Smokeless tobacco: Never   Tobacco comments:    quit on 03/15/16  Vaping Use   Vaping Use: Never used  Substance and Sexual Activity   Alcohol use: No   Drug use: Yes    Types: Marijuana    Comment: last time 02/2016   Sexual activity: Yes  Other Topics Concern   Not on file  Social History Narrative   Lives in [place] by himself.  Sometimes stays with girlfriend in [place].  Does not routinely exercise.   Social Determinants of Health   Financial Resource Strain: Not on file  Food Insecurity: Not on file  Transportation Needs: Not on file  Physical Activity: Not on file  Stress: Not on file  Social Connections: Not on file   Outpatient Medications Prior to Visit  Medication Sig   acetaminophen  (TYLENOL) 325 MG tablet Take 2 tablets (650 mg total) by mouth every 4 (four) hours as needed for headache or mild pain.   aspirin 81 MG EC tablet Take 1 tablet (81 mg total) by mouth daily.   atorvastatin (LIPITOR) 80 MG tablet TAKE 1 TABLET BY MOUTH EVERY DAY AT 6 PM   blood glucose meter kit and supplies Dispense based on patient and insurance preference. Use up to four times daily as directed. (FOR ICD-10 E10.9, E11.9).   buPROPion (WELLBUTRIN XL) 150 MG 24 hr tablet TAKE 1 TABLET BY MOUTH EVERY DAY   carvedilol (COREG) 25 MG tablet Take 1 tablet (25 mg total) by  mouth 2 (two) times daily.   clotrimazole-betamethasone (LOTRISONE) cream Apply 1 application topically as needed.   dapagliflozin propanediol (FARXIGA) 10 MG TABS tablet Take 1 tablet (10 mg total) by mouth daily before breakfast.   digoxin (LANOXIN) 0.125 MG tablet Take 1 tablet (125 mcg total) by mouth daily.   Evolocumab (REPATHA SURECLICK) 970 MG/ML SOAJ INJECT 1 PEN INTO THE SKIN EVERY 14 (FOURTEEN) DAYS.   fenofibrate 160 MG tablet Take 1 tablet (160 mg total) by mouth daily.   furosemide (LASIX) 40 MG tablet Take 1 tablet (40 mg total) by mouth daily.   icosapent Ethyl (VASCEPA) 1 g capsule Take 2 capsules (2 g total) by mouth 2 (two) times daily.   insulin glargine (LANTUS SOLOSTAR) 100 UNIT/ML Solostar Pen Inject 20 Units into the skin daily.   Insulin Pen Needle (PEN NEEDLES 3/16") 31G X 5 MM MISC 1 Device by Other route daily in the afternoon. Lantus 23   isosorbide-hydrALAZINE (BIDIL) 20-37.5 MG tablet Take 1 tablet by mouth 3 (three) times daily.   metFORMIN (GLUCOPHAGE-XR) 500 MG 24 hr tablet Take 1 tablet (500 mg total) by mouth 2 (two) times daily.   nitroGLYCERIN (NITROSTAT) 0.4 MG SL tablet Place 1 tablet (0.4 mg total) under the tongue every 5 (five) minutes as needed for chest pain.   potassium chloride SA (KLOR-CON M) 20 MEQ tablet Take 2 tablets (40 mEq total) by mouth daily.   sacubitril-valsartan (ENTRESTO)  97-103 MG Take 1 tablet by mouth 2 (two) times daily.   Semaglutide, 2 MG/DOSE, (OZEMPIC, 2 MG/DOSE,) 8 MG/3ML SOPN Inject 2 mg into the skin once a week.   spironolactone (ALDACTONE) 25 MG tablet Take 1 tablet (25 mg total) by mouth daily.   triamcinolone cream (KENALOG) 0.1 % Apply 1 application topically as needed.   No facility-administered medications prior to visit.   No Known Allergies  Immunization History  Administered Date(s) Administered   PFIZER(Purple Top)SARS-COV-2 Vaccination 04/26/2020, 05/17/2020    Health Maintenance  Topic Date Due   OPHTHALMOLOGY EXAM  Never done   DTaP/Tdap/Td (1 - Tdap) Never done   COLONOSCOPY (Pts 45-31yr Insurance coverage will need to be confirmed)  Never done   COVID-19 Vaccine (3 - Pfizer risk series) 06/14/2020   INFLUENZA VACCINE  Never done   HEMOGLOBIN A1C  10/05/2022   FOOT EXAM  04/05/2023   Hepatitis C Screening  Completed   HIV Screening  Completed   HPV VACCINES  Aged Out    Patient Care Team: MMaximiano Coss NP as PCP - General (Adult Health Nurse Practitioner) NJosue Hector MD as PCP - Cardiology (Cardiology) TSueanne Margarita MD as PCP - Sleep Medicine (Cardiology)  Review of Systems  {Labs  Heme  Chem  Endocrine  Serology  Results Review (optional):[id number]}   Objective    There were no vitals taken for this visit. {Show previous vital signs (optional):23777}  Physical Exam ***  Depression Screen    06/02/2020   10:05 AM 02/12/2020    3:56 PM 04/19/2016   12:44 PM 04/05/2016   11:08 AM  PHQ 2/9 Scores  PHQ - 2 Score 0 0 5 2  PHQ- 9 Score  0 12 8   No results found for any visits on 07/18/22.  Assessment & Plan     ***  No follow-ups on file.     {provider attestation***:1}   JMardene Russo PHershal Coria [phone](phone) [phone](fax)  [place]

## 2022-07-19 ENCOUNTER — Observation Stay (HOSPITAL_COMMUNITY): Payer: Medicare Other

## 2022-07-19 ENCOUNTER — Emergency Department (HOSPITAL_COMMUNITY): Payer: Medicare Other

## 2022-07-19 ENCOUNTER — Observation Stay (HOSPITAL_COMMUNITY)
Admission: EM | Admit: 2022-07-19 | Discharge: 2022-07-20 | Disposition: A | Discharge status: Home / Self Care | Payer: Medicare Other | Attending: Internal Medicine | Admitting: Internal Medicine

## 2022-07-19 ENCOUNTER — Other Ambulatory Visit: Payer: Self-pay

## 2022-07-19 DIAGNOSIS — Z87891 Personal history of nicotine dependence: Secondary | ICD-10-CM | POA: Insufficient documentation

## 2022-07-19 DIAGNOSIS — R519 Headache, unspecified: Secondary | ICD-10-CM | POA: Diagnosis present

## 2022-07-19 DIAGNOSIS — Z794 Long term (current) use of insulin: Secondary | ICD-10-CM | POA: Insufficient documentation

## 2022-07-19 DIAGNOSIS — Z7984 Long term (current) use of oral hypoglycemic drugs: Secondary | ICD-10-CM | POA: Diagnosis not present

## 2022-07-19 DIAGNOSIS — E1169 Type 2 diabetes mellitus with other specified complication: Secondary | ICD-10-CM | POA: Insufficient documentation

## 2022-07-19 DIAGNOSIS — R809 Proteinuria, unspecified: Secondary | ICD-10-CM | POA: Diagnosis not present

## 2022-07-19 DIAGNOSIS — F411 Generalized anxiety disorder: Secondary | ICD-10-CM | POA: Diagnosis present

## 2022-07-19 DIAGNOSIS — R2681 Unsteadiness on feet: Secondary | ICD-10-CM | POA: Insufficient documentation

## 2022-07-19 DIAGNOSIS — I2510 Atherosclerotic heart disease of native coronary artery without angina pectoris: Secondary | ICD-10-CM | POA: Insufficient documentation

## 2022-07-19 DIAGNOSIS — I110 Hypertensive heart disease with heart failure: Secondary | ICD-10-CM | POA: Insufficient documentation

## 2022-07-19 DIAGNOSIS — I5042 Chronic combined systolic (congestive) and diastolic (congestive) heart failure: Secondary | ICD-10-CM | POA: Diagnosis not present

## 2022-07-19 DIAGNOSIS — Z79899 Other long term (current) drug therapy: Secondary | ICD-10-CM | POA: Diagnosis not present

## 2022-07-19 DIAGNOSIS — Z955 Presence of coronary angioplasty implant and graft: Secondary | ICD-10-CM | POA: Insufficient documentation

## 2022-07-19 DIAGNOSIS — Z7982 Long term (current) use of aspirin: Secondary | ICD-10-CM | POA: Insufficient documentation

## 2022-07-19 DIAGNOSIS — N179 Acute kidney failure, unspecified: Secondary | ICD-10-CM | POA: Diagnosis not present

## 2022-07-19 DIAGNOSIS — G43109 Migraine with aura, not intractable, without status migrainosus: Secondary | ICD-10-CM

## 2022-07-19 DIAGNOSIS — I10 Essential (primary) hypertension: Secondary | ICD-10-CM | POA: Diagnosis present

## 2022-07-19 DIAGNOSIS — I6381 Other cerebral infarction due to occlusion or stenosis of small artery: Secondary | ICD-10-CM

## 2022-07-19 DIAGNOSIS — E785 Hyperlipidemia, unspecified: Secondary | ICD-10-CM | POA: Diagnosis present

## 2022-07-19 DIAGNOSIS — I639 Cerebral infarction, unspecified: Principal | ICD-10-CM | POA: Diagnosis present

## 2022-07-19 DIAGNOSIS — H53461 Homonymous bilateral field defects, right side: Secondary | ICD-10-CM

## 2022-07-19 DIAGNOSIS — R41841 Cognitive communication deficit: Secondary | ICD-10-CM | POA: Insufficient documentation

## 2022-07-19 DIAGNOSIS — I255 Ischemic cardiomyopathy: Secondary | ICD-10-CM | POA: Diagnosis present

## 2022-07-19 DIAGNOSIS — I63332 Cerebral infarction due to thrombosis of left posterior cerebral artery: Secondary | ICD-10-CM

## 2022-07-19 DIAGNOSIS — E1129 Type 2 diabetes mellitus with other diabetic kidney complication: Secondary | ICD-10-CM

## 2022-07-19 DIAGNOSIS — K219 Gastro-esophageal reflux disease without esophagitis: Secondary | ICD-10-CM | POA: Diagnosis present

## 2022-07-19 LAB — DIFFERENTIAL
Abs Immature Granulocytes: 0.08 10*3/uL — ABNORMAL HIGH (ref 0.00–0.07)
Basophils Absolute: 0.1 10*3/uL (ref 0.0–0.1)
Basophils Relative: 1 %
Eosinophils Absolute: 0.3 10*3/uL (ref 0.0–0.5)
Eosinophils Relative: 2 %
Immature Granulocytes: 1 %
Lymphocytes Relative: 32 %
Lymphs Abs: 3.8 10*3/uL (ref 0.7–4.0)
Monocytes Absolute: 1 10*3/uL (ref 0.1–1.0)
Monocytes Relative: 9 %
Neutro Abs: 6.5 10*3/uL (ref 1.7–7.7)
Neutrophils Relative %: 55 %

## 2022-07-19 LAB — I-STAT CHEM 8, ED
BUN: 22 mg/dL — ABNORMAL HIGH (ref 6–20)
Calcium, Ion: 1.13 mmol/L — ABNORMAL LOW (ref 1.15–1.40)
Chloride: 102 mmol/L (ref 98–111)
Creatinine, Ser: 1.6 mg/dL — ABNORMAL HIGH (ref 0.61–1.24)
Glucose, Bld: 105 mg/dL — ABNORMAL HIGH (ref 70–99)
HCT: 53 % — ABNORMAL HIGH (ref 39.0–52.0)
Hemoglobin: 18 g/dL — ABNORMAL HIGH (ref 13.0–17.0)
Potassium: 4.1 mmol/L (ref 3.5–5.1)
Sodium: 141 mmol/L (ref 135–145)
TCO2: 30 mmol/L (ref 22–32)

## 2022-07-19 LAB — APTT: aPTT: 31 seconds (ref 24–36)

## 2022-07-19 LAB — CBG MONITORING, ED: Glucose-Capillary: 104 mg/dL — ABNORMAL HIGH (ref 70–99)

## 2022-07-19 LAB — COMPREHENSIVE METABOLIC PANEL
ALT: 24 U/L (ref 0–44)
AST: 24 U/L (ref 15–41)
Albumin: 4.4 g/dL (ref 3.5–5.0)
Alkaline Phosphatase: 66 U/L (ref 38–126)
Anion gap: 12 (ref 5–15)
BUN: 17 mg/dL (ref 6–20)
CO2: 25 mmol/L (ref 22–32)
Calcium: 10.1 mg/dL (ref 8.9–10.3)
Chloride: 102 mmol/L (ref 98–111)
Creatinine, Ser: 1.62 mg/dL — ABNORMAL HIGH (ref 0.61–1.24)
GFR, Estimated: 52 mL/min — ABNORMAL LOW (ref >60)
Glucose, Bld: 104 mg/dL — ABNORMAL HIGH (ref 70–99)
Potassium: 3.6 mmol/L (ref 3.5–5.1)
Sodium: 139 mmol/L (ref 135–145)
Total Bilirubin: 0.7 mg/dL (ref 0.3–1.2)
Total Protein: 8.4 g/dL — ABNORMAL HIGH (ref 6.5–8.1)

## 2022-07-19 LAB — CBC
HCT: 51.3 % (ref 39.0–52.0)
Hemoglobin: 18.1 g/dL — ABNORMAL HIGH (ref 13.0–17.0)
MCH: 30.2 pg (ref 26.0–34.0)
MCHC: 35.3 g/dL (ref 30.0–36.0)
MCV: 85.6 fL (ref 80.0–100.0)
Platelets: 239 10*3/uL (ref 150–400)
RBC: 5.99 MIL/uL — ABNORMAL HIGH (ref 4.22–5.81)
RDW: 13.3 % (ref 11.5–15.5)
WBC: 11.7 10*3/uL — ABNORMAL HIGH (ref 4.0–10.5)
nRBC: 0 % (ref 0.0–0.2)

## 2022-07-19 LAB — PROTIME-INR
INR: 1 (ref 0.8–1.2)
Prothrombin Time: 13 seconds (ref 11.4–15.2)

## 2022-07-19 MED ORDER — SENNOSIDES-DOCUSATE SODIUM 8.6-50 MG PO TABS: 1.0000 | ORAL_TABLET | Freq: Every evening | ORAL | Status: DC | PRN

## 2022-07-19 MED ORDER — CLOPIDOGREL BISULFATE 75 MG PO TABS
75.0000 mg | ORAL_TABLET | Freq: Every day | ORAL | Status: DC
  Administered 2022-07-19 – 2022-07-20 (×2): 75 mg via ORAL
  Filled 2022-07-19 (×2): qty 1

## 2022-07-19 MED ORDER — LORAZEPAM 2 MG/ML IJ SOLN
0.5000 mg | Freq: Once | INTRAMUSCULAR | Status: AC
  Administered 2022-07-19: 0.5 mg via INTRAVENOUS
  Filled 2022-07-19: qty 1

## 2022-07-19 MED ORDER — INSULIN GLARGINE-YFGN 100 UNIT/ML ~~LOC~~ SOLN
20.0000 [IU] | Freq: Every day | SUBCUTANEOUS | Status: DC
  Administered 2022-07-19: 20 [IU] via SUBCUTANEOUS
  Filled 2022-07-19 (×2): qty 0.2

## 2022-07-19 MED ORDER — DAPAGLIFLOZIN PROPANEDIOL 10 MG PO TABS
10.0000 mg | ORAL_TABLET | Freq: Every day | ORAL | Status: DC
  Administered 2022-07-20: 10 mg via ORAL
  Filled 2022-07-19 (×2): qty 1

## 2022-07-19 MED ORDER — DEXAMETHASONE SODIUM PHOSPHATE 10 MG/ML IJ SOLN
10.0000 mg | Freq: Once | INTRAMUSCULAR | Status: AC
  Administered 2022-07-19: 10 mg via INTRAVENOUS
  Filled 2022-07-19: qty 1

## 2022-07-19 MED ORDER — DIPHENHYDRAMINE HCL 50 MG/ML IJ SOLN
25.0000 mg | Freq: Once | INTRAMUSCULAR | Status: AC
  Administered 2022-07-19: 25 mg via INTRAVENOUS
  Filled 2022-07-19: qty 1

## 2022-07-19 MED ORDER — INSULIN ASPART 100 UNIT/ML IJ SOLN
0.0000 [IU] | Freq: Three times a day (TID) | INTRAMUSCULAR | Status: DC
  Administered 2022-07-20 (×2): 3 [IU] via SUBCUTANEOUS

## 2022-07-19 MED ORDER — IOHEXOL 350 MG/ML SOLN
75.0000 mL | Freq: Once | INTRAVENOUS | Status: AC | PRN
  Administered 2022-07-19: 75 mL via INTRAVENOUS

## 2022-07-19 MED ORDER — FUROSEMIDE 40 MG PO TABS
40.0000 mg | ORAL_TABLET | Freq: Every day | ORAL | Status: DC
  Administered 2022-07-19 – 2022-07-20 (×2): 40 mg via ORAL
  Filled 2022-07-19: qty 1
  Filled 2022-07-19: qty 2

## 2022-07-19 MED ORDER — FENOFIBRATE 160 MG PO TABS
160.0000 mg | ORAL_TABLET | Freq: Every day | ORAL | Status: DC
  Administered 2022-07-20: 160 mg via ORAL
  Filled 2022-07-19: qty 1

## 2022-07-19 MED ORDER — PROCHLORPERAZINE EDISYLATE 10 MG/2ML IJ SOLN
10.0000 mg | Freq: Once | INTRAMUSCULAR | Status: AC
  Administered 2022-07-19: 10 mg via INTRAVENOUS
  Filled 2022-07-19: qty 2

## 2022-07-19 MED ORDER — ICOSAPENT ETHYL 1 G PO CAPS
2.0000 g | ORAL_CAPSULE | Freq: Two times a day (BID) | ORAL | Status: DC
  Administered 2022-07-19 – 2022-07-20 (×2): 2 g via ORAL
  Filled 2022-07-19 (×3): qty 2

## 2022-07-19 MED ORDER — KETOROLAC TROMETHAMINE 15 MG/ML IJ SOLN
15.0000 mg | Freq: Once | INTRAMUSCULAR | Status: DC
  Filled 2022-07-19: qty 1

## 2022-07-19 MED ORDER — LACTATED RINGERS IV BOLUS
1000.0000 mL | Freq: Once | INTRAVENOUS | Status: AC
  Administered 2022-07-19: 1000 mL via INTRAVENOUS

## 2022-07-19 MED ORDER — SACUBITRIL-VALSARTAN 97-103 MG PO TABS
1.0000 | ORAL_TABLET | Freq: Two times a day (BID) | ORAL | Status: DC
  Administered 2022-07-19 – 2022-07-20 (×2): 1 via ORAL
  Filled 2022-07-19 (×3): qty 1

## 2022-07-19 MED ORDER — DIGOXIN 125 MCG PO TABS
125.0000 ug | ORAL_TABLET | Freq: Every day | ORAL | Status: DC
  Administered 2022-07-20: 125 ug via ORAL
  Filled 2022-07-19: qty 1

## 2022-07-19 MED ORDER — ASPIRIN 81 MG PO TBEC: 81.0000 mg | DELAYED_RELEASE_TABLET | Freq: Every day | ORAL | Status: DC

## 2022-07-19 MED ORDER — KETOROLAC TROMETHAMINE 15 MG/ML IJ SOLN
15.0000 mg | Freq: Once | INTRAMUSCULAR | Status: AC
  Administered 2022-07-19: 15 mg via INTRAVENOUS

## 2022-07-19 MED ORDER — ISOSORB DINITRATE-HYDRALAZINE 20-37.5 MG PO TABS
1.0000 | ORAL_TABLET | Freq: Three times a day (TID) | ORAL | Status: DC
  Administered 2022-07-19 – 2022-07-20 (×2): 1 via ORAL
  Filled 2022-07-19 (×3): qty 1

## 2022-07-19 MED ORDER — DROPERIDOL 2.5 MG/ML IJ SOLN
1.2500 mg | Freq: Once | INTRAMUSCULAR | Status: AC
  Administered 2022-07-19: 1.25 mg via INTRAVENOUS
  Filled 2022-07-19: qty 2

## 2022-07-19 MED ORDER — ENOXAPARIN SODIUM 40 MG/0.4ML IJ SOSY
40.0000 mg | PREFILLED_SYRINGE | INTRAMUSCULAR | Status: DC
  Administered 2022-07-19: 40 mg via SUBCUTANEOUS
  Filled 2022-07-19: qty 0.4

## 2022-07-19 MED ORDER — POTASSIUM CHLORIDE CRYS ER 20 MEQ PO TBCR
40.0000 meq | EXTENDED_RELEASE_TABLET | Freq: Every day | ORAL | Status: DC
  Administered 2022-07-19 – 2022-07-20 (×2): 40 meq via ORAL
  Filled 2022-07-19 (×2): qty 2

## 2022-07-19 MED ORDER — METFORMIN HCL ER 500 MG PO TB24: 500.0000 mg | ORAL_TABLET | Freq: Two times a day (BID) | ORAL | Status: DC

## 2022-07-19 MED ORDER — KETOROLAC TROMETHAMINE 15 MG/ML IJ SOLN
15.0000 mg | Freq: Four times a day (QID) | INTRAMUSCULAR | Status: DC | PRN
  Administered 2022-07-20: 15 mg via INTRAVENOUS
  Filled 2022-07-19: qty 1

## 2022-07-19 MED ORDER — ASPIRIN 81 MG PO CHEW
81.0000 mg | CHEWABLE_TABLET | Freq: Every day | ORAL | Status: DC
  Administered 2022-07-19 – 2022-07-20 (×2): 81 mg via ORAL
  Filled 2022-07-19 (×2): qty 1

## 2022-07-19 MED ORDER — SODIUM CHLORIDE 0.9 % IV SOLN: INTRAVENOUS | Status: DC

## 2022-07-19 MED ORDER — ATORVASTATIN CALCIUM 80 MG PO TABS
80.0000 mg | ORAL_TABLET | Freq: Every evening | ORAL | Status: DC
  Administered 2022-07-19 – 2022-07-20 (×2): 80 mg via ORAL
  Filled 2022-07-19: qty 2
  Filled 2022-07-19: qty 1

## 2022-07-19 MED ORDER — BUPROPION HCL ER (XL) 150 MG PO TB24
150.0000 mg | ORAL_TABLET | Freq: Every day | ORAL | Status: DC
  Administered 2022-07-20: 150 mg via ORAL
  Filled 2022-07-19: qty 1

## 2022-07-19 MED ORDER — CARVEDILOL 12.5 MG PO TABS
25.0000 mg | ORAL_TABLET | Freq: Two times a day (BID) | ORAL | Status: DC
  Administered 2022-07-20: 25 mg via ORAL
  Filled 2022-07-19 (×2): qty 2

## 2022-07-19 MED ORDER — SPIRONOLACTONE 25 MG PO TABS
25.0000 mg | ORAL_TABLET | Freq: Every day | ORAL | Status: DC
  Administered 2022-07-19 – 2022-07-20 (×2): 25 mg via ORAL
  Filled 2022-07-19 (×3): qty 1

## 2022-07-19 MED ORDER — ONDANSETRON HCL 4 MG/2ML IJ SOLN: 4.0000 mg | Freq: Once | INTRAMUSCULAR | Status: DC

## 2022-07-19 MED ORDER — ACETAMINOPHEN 325 MG PO TABS: 650.0000 mg | ORAL_TABLET | ORAL | Status: DC | PRN

## 2022-07-19 NOTE — ED Provider Triage Note (Signed)
Emergency Medicine Provider Triage Evaluation Note  Peter Russo , a 46 y.o. male  was evaluated in triage.  Pt complains of sudden onset headache and visual changes to R  eye x 30 mins. Level 5 caveat for acuity of condition. Hyperventilating. States severe headache with decreased vision and blurry vision to R eye No weakness to arms or legs. No CP or SOB. No difficulty speaking or swallowing.  Review of Systems  Positive: Headache, visual changes  Negative: Chest pain, SOB, weakness to arms or legs  Physical Exam  BP (!) 149/91   Pulse 73   Temp 97.8 F (36.6 C)   Resp (!) 22   SpO2 100%  Gen:   Awake, no distress   hyperventilating Resp:  Normal effort  MSK:   Moves extremities without difficulty  Other:  No facial droop. 5/5 arms and legs. Difficulty with finger counting on R  Medical Decision Making  Medically screening exam initiated at 4:27 PM.  Appropriate orders placed.  JAYLENN ALTIER was informed that the remainder of the evaluation will be completed by another provider, this initial triage assessment does not replace that evaluation, and the importance of remaining in the ED until their evaluation is complete.  Code stroke activated. Sudden onset headache with visual changes to R eye. Onset 30 minutes ago.    Glynn Octave, MD 07/19/22 [phone]

## 2022-07-19 NOTE — Assessment & Plan Note (Signed)
Followed in heart failure clinic with most recent visit 07/05/22. He appears clinically well compensated, lungs clear  Plan BNP pending ` Continue home regimen

## 2022-07-19 NOTE — Assessment & Plan Note (Addendum)
Patient with sudden onset of severe headache, disarthria, confusion. He had no focal weakness. Initial CT head negative. CTA no LVO, occlusion of distal left P2 noted. Dr. Selina Cooley for neurology evaluated the patient - by which time symptoms except for HA were resolved. Working diagnosis acute CVA right PCA - not candidate for thrombolytic or IR intervention.  Plan Per Neurology recommendation: MRI brain, 2D echo, lipid panel, routine labs, A1C, PT/OT eval  Admit - obs medical tele.

## 2022-07-19 NOTE — ED Notes (Signed)
Pt transported to MRI at this time 

## 2022-07-19 NOTE — ED Notes (Signed)
Received verbal report from Josh RN at this time 

## 2022-07-19 NOTE — Assessment & Plan Note (Signed)
No GI c/o  Plan PPI 2/2 stress of admission
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
## 2022-07-19 NOTE — Assessment & Plan Note (Signed)
Patient did not appear anxious during exam/evaluation.   Plan Continue Bupropion XL

## 2022-07-19 NOTE — Assessment & Plan Note (Signed)
Last A1C 04/04/22 5.7%  Plan A1C ordered  Continue home regimen  Sliding scale AC/HS

## 2022-07-19 NOTE — H&P (Signed)
History and Physical    Peter Russo DJS:[id number] DOB: 08/15/1976 DOA: 07/19/2022  DOS: the patient was seen and examined on 07/19/2022  PCP: Patient, No Pcp Per   Patient coming from: Home  I have personally briefly reviewed patient's old medical records in [place] is a 46 y.o. male with history of MI, CAD, CHF, ischemic cardiomyopathy, hypertension, smoking and diabetes who presents with a sudden onset severe headache throughout the whole head. He also c/o confusion, anxiety and difficulty speaking along with blurred vision in the right eye. There is a reported history of migraine headche.  His symptoms began about 3:30 PM when he describes a sudden onset, severe headache.  He also noted some blurred vision in his right eye only and experienced acute anxiety. Due to his symptoms he presents to [facility] for evaluation.    ED Course: T 97.8  133/99  HR 73  RR 23. Code stroke initiated at admission to ED. Due to resolution of mild symptoms code stroke cancelled. However, with finding of occlusion of left P2 code stroke re-initiated. Dr. Quinn Axe for neurology saw patient. He is not a candidated to thrombolytic treatment or IR. Recommendation for complete CVA workup. TR[facility] called to admit patient.   Review of Systems:  Review of Systems  Constitutional:  Positive for chills. Negative for fever and weight loss.  HENT: Negative.    Eyes:  Negative for blurred vision (transient blurred vision right eye).  Respiratory:  Positive for cough. Negative for sputum production, shortness of breath and wheezing.   Cardiovascular:  Positive for chest pain. Negative for palpitations and orthopnea.  Gastrointestinal:  Positive for heartburn. Negative for nausea and vomiting.  Genitourinary: Negative.   Musculoskeletal: Negative.   Skin: Negative.   Neurological:  Positive for headaches. Negative for tingling and focal weakness.  Psychiatric/Behavioral:  Negative for  hallucinations and substance abuse. The patient is nervous/anxious.     Past Medical History:  Diagnosis Date   Anxiety    CAD S/P percutaneous coronary angioplasty    a. s/p BMS-mLAD 2010. b. DES to RCA 2012. c. 04/2015: DES to LCx and PCI to LAD c/b dissection with subsequent overlapping DES to mLAD - r/i for NSTEMI afterwards; d. 08/2015 Ant STEMI in setting of noncompliance->156mAD ISR (3.0x16 Synergy DES); e. 03/2016 PCI of apical LAD; f. 03/2016 Relook Cath: patent LAD stents, RCA 100p CTO-->Med Rx; g. 10/2016 Ant STEMI: PTCA 100p LAD.   Chronic combined systolic and diastolic CHF (congestive heart failure) ([place]    a. 03/2016 Echo: EF 30-35%, Gr2 DD;  b. 08/2016 Echo: EF 40%.   Depression    Essential hypertension    GAD (generalized anxiety disorder)    GERD (gastroesophageal reflux disease)    Hypercholesteremia    Ischemic cardiomyopathy    a. prior EF 25-35 percent at cath, 35-40 percent by echo; b. 03/2016 Echo: EF 30-35%, diff HK, Gr2 DD; c. 08/2016 Echo: EF 40%, base/mid inferior/inferowseptal AK, mildly dil LA.   Morbid obesity ([place]    Pre-diabetes    SVT (supraventricular tachycardia) APRROX 10 YRS AGO   PRESUMED AVNRT, ECHO 12/07 WITH EF 65%, MILD LAE   Tobacco abuse    a. 20 + pack years, quit 10/2016.    Past Surgical History:  Procedure Laterality Date   CARDIAC CATHETERIZATION N/A 04/23/2015   Procedure: Left Heart Cath and Coronary Angiography;  Surgeon: PJosue Hector MD;  [facility];  Service: Cardiovascular;  Laterality: N/A;   CARDIAC CATHETERIZATION N/A 04/23/2015   Procedure: Coronary Stent Intervention;  Surgeon: Sherren Mocha, MD;  Location: [place] [facility];  Service: Cardiovascular;  Laterality: N/A;   CARDIAC CATHETERIZATION N/A 09/14/2015   Procedure: Left Heart Cath and Coronary Angiography;  Surgeon: Belva Crome, MD;  LAD 100%, CFX 10% ISR, RCA 100% (chronic), EF 25-35%   CARDIAC CATHETERIZATION N/A 09/14/2015   Procedure: Coronary Stent  Intervention;  Surgeon: Belva Crome, MD;  Location: [place] [facility];  Service: Cardiovascular;  Laterality: N/A; 3.0 x 16 mm Synergy DES LAD   CARDIAC CATHETERIZATION  03/16/2016   CARDIAC CATHETERIZATION N/A 03/16/2016   Procedure: Left Heart Cath and Coronary Angiography;  Surgeon: Belva Crome, MD;  Location: [place] [facility];  Service: Cardiovascular;  Laterality: N/A;   CARDIAC CATHETERIZATION N/A 03/16/2016   Procedure: Coronary Balloon Angioplasty;  Surgeon: Belva Crome, MD;  Location: [place] [facility];  Service: Cardiovascular;  Laterality: N/A;   CARDIAC CATHETERIZATION N/A 03/25/2016   Procedure: Left Heart Cath and Coronary Angiography;  Surgeon: Peter M Martinique, MD;  Location: [place] [facility];  Service: Cardiovascular;  Laterality: N/A;   CORONARY ANGIOPLASTY WITH STENT PLACEMENT  2010; 2013   CORONARY BALLOON ANGIOPLASTY N/A 10/19/2016   Procedure: Coronary Balloon Angioplasty;  Surgeon: Jettie Booze, MD;  Location: [facility] [facility];  Service: Cardiovascular;  Laterality: N/A;   INTRAVASCULAR ULTRASOUND/IVUS N/A 10/19/2016   Procedure: Intravascular Ultrasound/IVUS;  Surgeon: Jettie Booze, MD;  Location: [facility] [facility];  Service: Cardiovascular;  Laterality: N/A;   LEFT HEART CATH AND CORONARY ANGIOGRAPHY N/A 10/19/2016   Procedure: Left Heart Cath and Coronary Angiography;  Surgeon: Jettie Booze, MD;  Location: [facility];  Service: Cardiovascular;  Laterality: N/A;   RIGHT/LEFT HEART CATH AND CORONARY ANGIOGRAPHY N/A 05/21/2020   Procedure: RIGHT/LEFT HEART CATH AND CORONARY ANGIOGRAPHY;  Surgeon: Larey Dresser, MD;  Location: [place] [facility] [facility];  Service: Cardiovascular;  Laterality: N/A;    Soc Hx - patient somnolent. States he never married. Fathered 3 children. Works as a Nurse, learning disability.    reports that he quit smoking about 5 years ago. His smoking use included cigarettes. He has a 24.00 pack-year smoking history. He has  never used smokeless tobacco. He reports current drug use. Drug: Marijuana. He reports that he does not drink alcohol.  No Known Allergies  Family History  Problem Relation Age of Onset   Arrhythmia Mother        MOTHER LIVED TO BE 89   Coronary artery disease Mother    Heart attack Mother    Hypertension Mother    Coronary artery disease Father 5   Heart disease Father    Heart attack Father    Heart attack Brother    Stroke Neg Hx     Prior to Admission medications   Medication Sig Start Date End Date Taking? Authorizing Provider  acetaminophen (TYLENOL) 325 MG tablet Take 2 tablets (650 mg total) by mouth every 4 (four) hours as needed for headache or mild pain. 11/13/16   Erlene Quan, PA-C  aspirin 81 MG EC tablet Take 1 tablet (81 mg total) by mouth daily. 10/21/16   Ledora Bottcher, PA  atorvastatin (LIPITOR) 80 MG tablet TAKE 1 TABLET BY MOUTH EVERY DAY AT 6 PM 01/11/22   Milford, Maricela Bo, FNP  blood glucose meter kit and supplies Dispense based on patient  and insurance preference. Use up to four times daily as directed. (FOR ICD-10 E10.9, E11.9). 05/24/20   Kathyrn Drown D, NP  buPROPion (WELLBUTRIN XL) 150 MG 24 hr tablet TAKE 1 TABLET BY MOUTH EVERY DAY 11/04/20   Josue Hector, MD  carvedilol (COREG) 25 MG tablet Take 1 tablet (25 mg total) by mouth 2 (two) times daily. 07/05/22   Milford, Maricela Bo, FNP  clotrimazole-betamethasone (LOTRISONE) cream Apply 1 application topically as needed.    [provider]  dapagliflozin propanediol (FARXIGA) 10 MG TABS tablet Take 1 tablet (10 mg total) by mouth daily before breakfast. 04/04/22   Shamleffer, Melanie Crazier, MD  digoxin (LANOXIN) 0.125 MG tablet Take 1 tablet (125 mcg total) by mouth daily. 01/11/22   Milford, Maricela Bo, FNP  Evolocumab (REPATHA SURECLICK) 161 MG/ML SOAJ INJECT 1 PEN INTO THE SKIN EVERY 14 (FOURTEEN) DAYS. 10/01/21   Larey Dresser, MD  fenofibrate 160 MG tablet Take 1 tablet (160 mg total)  by mouth daily. 01/11/22   Milford, Maricela Bo, FNP  furosemide (LASIX) 40 MG tablet Take 1 tablet (40 mg total) by mouth daily. 03/14/22   Larey Dresser, MD  icosapent Ethyl (VASCEPA) 1 g capsule Take 2 capsules (2 g total) by mouth 2 (two) times daily. 11/10/20   Larey Dresser, MD  insulin glargine (LANTUS SOLOSTAR) 100 UNIT/ML Solostar Pen Inject 20 Units into the skin daily. 04/04/22   Shamleffer, Melanie Crazier, MD  Insulin Pen Needle (PEN NEEDLES 3/16") 31G X 5 MM MISC 1 Device by Other route daily in the afternoon. Lantus 23 04/04/22   Shamleffer, Melanie Crazier, MD  isosorbide-hydrALAZINE (BIDIL) 20-37.5 MG tablet Take 1 tablet by mouth 3 (three) times daily. 01/11/22   Milford, Maricela Bo, FNP  metFORMIN (GLUCOPHAGE-XR) 500 MG 24 hr tablet Take 1 tablet (500 mg total) by mouth 2 (two) times daily. 04/04/22   Shamleffer, Melanie Crazier, MD  nitroGLYCERIN (NITROSTAT) 0.4 MG SL tablet Place 1 tablet (0.4 mg total) under the tongue every 5 (five) minutes as needed for chest pain. 03/11/22   Larey Dresser, MD  potassium chloride SA (KLOR-CON M) 20 MEQ tablet Take 2 tablets (40 mEq total) by mouth daily. 01/11/22   Milford, Maricela Bo, FNP  sacubitril-valsartan (ENTRESTO) 97-103 MG Take 1 tablet by mouth 2 (two) times daily. 01/11/22   Milford, Maricela Bo, FNP  Semaglutide, 2 MG/DOSE, (OZEMPIC, 2 MG/DOSE,) 8 MG/3ML SOPN Inject 2 mg into the skin once a week. 04/04/22   Shamleffer, Melanie Crazier, MD  spironolactone (ALDACTONE) 25 MG tablet Take 1 tablet (25 mg total) by mouth daily. 01/11/22   Milford, Maricela Bo, FNP  triamcinolone cream (KENALOG) 0.1 % Apply 1 application topically as needed.    [provider]    Physical Exam: Vitals:   07/19/22 1745 07/19/22 1800 07/19/22 1815 07/19/22 1830  BP: (!) 122/109 126/87 (!) 116/93 (!) 133/99  Pulse: 77 70 70 73  Resp: (!) 32 (!) 23 18 (!) 23  Temp:      SpO2: 99% 98% 99% 100%    Physical Exam Vitals and nursing note reviewed.   Constitutional:      General: He is not in acute distress.    Appearance: He is not ill-appearing.     Comments: Somnolent but answers questions slowly. Overweight.  HENT:     Head: Normocephalic and atraumatic.     Mouth/Throat:     Mouth: Mucous membranes are moist.     Comments: Native  dentition. No oral lesions Eyes:     Extraocular Movements: Extraocular movements intact.     Pupils: Pupils are equal, round, and reactive to light.     Comments: Bulbar conjunctiva injected  Cardiovascular:     Rate and Rhythm: Normal rate and regular rhythm.     Heart sounds: Normal heart sounds.  Pulmonary:     Effort: Pulmonary effort is normal.     Breath sounds: Normal breath sounds. No wheezing or rales.  Abdominal:     General: Bowel sounds are normal.     Palpations: Abdomen is soft.  Musculoskeletal:        General: Normal range of motion.     Cervical back: Normal range of motion and neck supple.  Skin:    General: Skin is warm and dry.  Neurological:     Mental Status: He is oriented to person, place, and time. He is lethargic.     Cranial Nerves: No cranial nerve deficit, dysarthria or facial asymmetry.     Sensory: No sensory deficit.     Motor: No weakness.     Deep Tendon Reflexes: Reflexes normal.  Psychiatric:        Mood and Affect: Mood normal.        Speech: Speech normal.      Labs on Admission: I have personally reviewed following labs and imaging studies  CBC: Recent Labs  Lab 07/19/22 1630 07/19/22 1645  WBC 11.7*  --   NEUTROABS 6.5  --   HGB 18.1* 18.0*  HCT 51.3 53.0*  MCV 85.6  --   PLT 239  --    Basic Metabolic Panel: Recent Labs  Lab 07/19/22 1630 07/19/22 1645  NA 139 141  K 3.6 4.1  CL 102 102  CO2 25  --   GLUCOSE 104* 105*  BUN 17 22*  CREATININE 1.62* 1.60*  CALCIUM 10.1  --    GFR: CrCl cannot be calculated (Unknown ideal weight.). Liver Function Tests: Recent Labs  Lab 07/19/22 1630  AST 24  ALT 24  ALKPHOS 66   BILITOT 0.7  PROT 8.4*  ALBUMIN 4.4   No results for input(s): "LIPASE", "AMYLASE" in the last 168 hours. No results for input(s): "AMMONIA" in the last 168 hours. Coagulation Profile: Recent Labs  Lab 07/19/22 1630  INR 1.0   Cardiac Enzymes: No results for input(s): "CKTOTAL", "CKMB", "CKMBINDEX", "TROPONINI" in the last 168 hours. BNP (last 3 results) No results for input(s): "PROBNP" in the last 8760 hours. HbA1C: No results for input(s): "HGBA1C" in the last 72 hours. CBG: Recent Labs  Lab 07/19/22 1625  GLUCAP 104*   Lipid Profile: No results for input(s): "CHOL", "HDL", "LDLCALC", "TRIG", "CHOLHDL", "LDLDIRECT" in the last 72 hours. Thyroid Function Tests: No results for input(s): "TSH", "T4TOTAL", "FREET4", "T3FREE", "THYROIDAB" in the last 72 hours. Anemia Panel: No results for input(s): "VITAMINB12", "FOLATE", "FERRITIN", "TIBC", "IRON", "RETICCTPCT" in the last 72 hours. Urine analysis:    Component Value Date/Time   COLORURINE YELLOW 05/21/2020 1944   APPEARANCEUR CLEAR 05/21/2020 1944   LABSPEC 1.014 05/21/2020 1944   PHURINE 5.0 05/21/2020 1944   GLUCOSEU >=500 (A) 05/21/2020 1944   HGBUR SMALL (A) 05/21/2020 1944   BILIRUBINUR NEGATIVE 05/21/2020 1944   KETONESUR NEGATIVE 05/21/2020 1944   PROTEINUR 100 (A) 05/21/2020 1944   UROBILINOGEN 0.2 02/26/2020 1145   NITRITE NEGATIVE 05/21/2020 1944   LEUKOCYTESUR NEGATIVE 05/21/2020 1944    Radiological Exams on Admission: I have personally reviewed images  CT ANGIO HEAD NECK W WO CM (CODE STROKE)  Result Date: 07/19/2022 CLINICAL DATA:  Stroke suspected EXAM: CT ANGIOGRAPHY HEAD AND NECK TECHNIQUE: Multidetector CT imaging of the head and neck was performed using the standard protocol during bolus administration of intravenous contrast. Multiplanar CT image reconstructions and MIPs were obtained to evaluate the vascular anatomy. Carotid stenosis measurements (when applicable) are obtained utilizing NASCET  criteria, using the distal internal carotid diameter as the denominator. RADIATION DOSE REDUCTION: This exam was performed according to the departmental dose-optimization program which includes automated exposure control, adjustment of the mA and/or kV according to patient size and/or use of iterative reconstruction technique. CONTRAST:  54m OMNIPAQUE IOHEXOL 350 MG/ML SOLN COMPARISON:  No prior CTA available; correlation is made with same day CT head FINDINGS: CT HEAD FINDINGS For noncontrast findings, please see same day CT head. CTA NECK FINDINGS Aortic arch: Standard branching. Imaged portion shows no evidence of aneurysm or dissection. No significant stenosis of the major arch vessel origins. Right carotid system: No evidence of stenosis, dissection, or occlusion. Left carotid system: No evidence of stenosis, dissection, or occlusion. Vertebral arteries: No evidence of stenosis, dissection, or occlusion. Skeleton: No acute osseous abnormality. Other neck: Negative. Upper chest: Negative. Review of the MIP images confirms the above findings CTA HEAD FINDINGS Anterior circulation: Both internal carotid arteries are patent to the termini, without significant stenosis. A1 segments patent. Normal anterior communicating artery. Anterior cerebral arteries are patent to their distal aspects. No M1 stenosis or occlusion. Moderate stenosis in a right M4 branch (series 6, images 252-254) and diminished opacification of downstream branch vessels. Distal MCA branches are otherwise perfused without significant stenosis. Posterior circulation: Vertebral arteries patent to the vertebrobasilar junction without stenosis. Posterior inferior cerebellar arteries patent proximally. Basilar patent to its distal aspect. Superior cerebellar arteries patent proximally. Patent P1 segments. Occlusion of the distal left P2 (series 6, images 228-230). The right PCA is patent to its distal aspects. Venous sinuses: Not well opacified.  Anatomic variants: None significant. Review of the MIP images confirms the above findings IMPRESSION: 1. Occlusion of the distal left P2. 2. Moderate stenosis in a right M4 branch with diminished opacification of downstream branch vessels. 3. No hemodynamically significant stenosis in the neck. Code stroke imaging results were communicated on 07/19/2022 at 5:14 pm to provider STACK via telephone, who verbally acknowledged these results. Electronically Signed   By: AMerilyn BabaM.D.   On: 07/19/2022 17:15   CT HEAD CODE STROKE WO CONTRAST  Result Date: 07/19/2022 CLINICAL DATA:  Code stroke. EXAM: CT HEAD WITHOUT CONTRAST TECHNIQUE: Contiguous axial images were obtained from the base of the skull through the vertex without intravenous contrast. RADIATION DOSE REDUCTION: This exam was performed according to the departmental dose-optimization program which includes automated exposure control, adjustment of the mA and/or kV according to patient size and/or use of iterative reconstruction technique. COMPARISON:  None Available. FINDINGS: Brain: No evidence of acute infarction, hemorrhage, cerebral edema, mass, mass effect, or midline shift. No hydrocephalus or extra-axial collection. Vascular: No hyperdense vessel. Skull: Negative for fracture or focal lesion. Sinuses/Orbits: Mild mucosal thickening in the left maxillary sinus. Otherwise clear paranasal sinuses. No acute finding in the orbits. Other: The mastoid air cells are well aerated. ASPECTS ([facility]roke Program Early CT Score) - Ganglionic level infarction (caudate, lentiform nuclei, internal capsule, insula, M1-M3 cortex): 7 - Supraganglionic infarction (M4-M6 cortex): 3 Total score (0-10 with 10 being normal): 10 IMPRESSION: No acute intracranial process.  ASPECTS is  10. Code stroke imaging results were communicated on 07/19/2022 at 4:48 pm to provider STACK via secure text paging. Electronically Signed   By: Merilyn Baba M.D.   On: 07/19/2022 16:49     EKG: I have personally reviewed EKG: sinus rhythm, PVCs, LVH, old anterior injury. No acute changes  Assessment/Plan Principal Problem:   CVA (cerebral vascular accident) ([place]) Active Problems:   Dyslipidemia   GERD (gastroesophageal reflux disease)   GAD (generalized anxiety disorder)   Essential hypertension   Ischemic cardiomyopathy   AKI (acute kidney injury) ([place])   Type 2 diabetes mellitus with microalbuminuria, with long-term current use of insulin (HCC)    Assessment and Plan: * CVA (cerebral vascular accident) [facility]) Patient with sudden onset of severe headache, disarthria, confusion. He had no focal weakness. Initial CT head negative. CTA no LVO, occlusion of distal left P2 noted. Dr. Quinn Axe for neurology evaluated the patient - by which time symptoms except for HA were resolved. Working diagnosis acute CVA right PCA - not candidate for thrombolytic or IR intervention.  Plan Per Neurology recommendation: MRI brain, 2D echo, lipid panel, routine labs, A1C, PT/OT eval  Admit - obs medical tele.  Type 2 diabetes mellitus with microalbuminuria, with long-term current use of insulin (HCC) Last A1C 04/04/22 5.7%  Plan A1C ordered  Continue home regimen  Sliding scale AC/HS  AKI (acute kidney injury) ([place]) Admission Cr 1.6 - chart reviewed and this is near baseline.  Plan Gentle hydration NS a 50cc/hr for 12-24 hrs.    F/u Bmet in AM  Ischemic cardiomyopathy Followed in heart failure clinic with most recent visit 07/05/22. He appears clinically well compensated, lungs clear  Plan BNP pending ` Continue home regimen  Essential hypertension BP mildly elevated.  Plan Continue home medications  GAD (generalized anxiety disorder) Patient did not appear anxious during exam/evaluation.   Plan Continue Bupropion XL  GERD (gastroesophageal reflux disease) No GI c/o  Plan PPI 2/2 stress of admission  Dyslipidemia Patient on high dose statin therapy. He reports he is  adherent.  Plan Lipid panel in AM  Continue home meds       DVT prophylaxis: Lovenox Code Status: Full Code Family Communication: spoke with Marijo Conception - SO.  Disposition Plan: home when stable  Consults called: neurology - Dr. Quinn Axe  Admission status: Observation, Telemetry bed   Adella Hare, MD [facility] 07/19/2022, 7:23 PM

## 2022-07-19 NOTE — Assessment & Plan Note (Signed)
Admission Cr 1.6 - chart reviewed and this is near baseline.  Plan Gentle hydration NS a 50cc/hr for 12-24 hrs.    F/u Bmet in AM

## 2022-07-19 NOTE — Subjective & Objective (Signed)
Peter Russo is a 46 y.o. male with history of MI, CAD, CHF, ischemic cardiomyopathy, hypertension, smoking and diabetes who presents with a sudden onset severe headache throughout the whole head. He also c/o confusion, anxiety and difficulty speaking along with blurred vision in the right eye. There is a reported history of migraine headche.  His symptoms began about 3:30 PM when he describes a sudden onset, severe headache.  He also noted some blurred vision in his right eye only and experienced acute anxiety. Due to his symptoms he presents to [facility] for evaluation.

## 2022-07-19 NOTE — ED Notes (Signed)
Received call from MRI was advised pt wouldn't be still to have a MRI done that the pt was educated multiple times that he needed to be still for procedure and pt continued to move. Pt was returned back from MRI at this time. ED Provider made aware

## 2022-07-19 NOTE — Code Documentation (Signed)
Stroke Response Nurse Documentation Code Documentation  Peter Russo is a 46 y.o. male arriving to [facility]  via [employer] on 07/19/2022 with past medical hx of SVT, essential hypertension, GERD, Hypercholesteremia, CAD, anxiety, and depression. On No antithrombotic.   Patient drove to ED complaining of headache and blurred vision in right eye. LKW 1600. Code stroke was activated by ED.   Stroke team meet patient in CT where he was noted to be diaphoretic and very anxious. Much calmer after CT when assessment could be completed. NIHSS 1, see documentation for details and code stroke times. Patient with Expressive aphasia  on exam. The following imaging was completed:  CT Head and CTA. Patient complained of nausea post CTA. Emesis x1.   Patient is not a candidate for IV Thrombolytic due to too mild to treat. Initially cancelled code stroke, but upon further CT review, a distal left P2 occlusion was reported and code stroke was reactivated for stroke work up. Patient is not a candidate for IR due to too distal to treat.   Care Plan: q2 hour assessments; admit for stroke workup.   Bedside handoff with ED RN.    Ferman Hamming Stroke Response RN

## 2022-07-19 NOTE — ED Notes (Signed)
ED provider at bedside.

## 2022-07-19 NOTE — Consult Note (Signed)
Neurology Consultation  Reason for Consult: Headache, aphasia and confusion Referring Physician: Dr. Matilde Sprang  CC: Headache  History is obtained from: Patient and chart  HPI: Peter Russo is a 46 y.o. male with history of MI, CAD, CHF, ischemic cardiomyopathy, hypertension, smoking and diabetes who presents with a sudden onset severe headache throughout the whole head as well as confusion, anxiety and difficulty speaking along with blurred vision in the right eye.  Patient states that he was in his usual state of health today until about 3:30 PM when he describes a sudden onset, severe headache.  He also noted some blurred vision in his right eye only and experienced acute anxiety.  He did have some trouble speaking and recalling names of family members, but this was transient and resolved with time.  On further exam, he was able to name objects without difficulty.   LKW: 1530 TNK given?: no, too mild to treat IR Thrombectomy? No, primary deficit is too mild to treat Modified Rankin Scale: 1-No significant post stroke disability and can perform usual duties with stroke symptoms  ROS: A complete ROS was performed and is negative except as noted in the HPI.   Past Medical History:  Diagnosis Date   Anxiety    CAD S/P percutaneous coronary angioplasty    a. s/p BMS-mLAD 2010. b. DES to RCA 2012. c. 04/2015: DES to LCx and PCI to LAD c/b dissection with subsequent overlapping DES to mLAD - r/i for NSTEMI afterwards; d. 08/2015 Ant STEMI in setting of noncompliance->145mAD ISR (3.0x16 Synergy DES); e. 03/2016 PCI of apical LAD; f. 03/2016 Relook Cath: patent LAD stents, RCA 100p CTO-->Med Rx; g. 10/2016 Ant STEMI: PTCA 100p LAD.   Chronic combined systolic and diastolic CHF (congestive heart failure) ([place]    a. 03/2016 Echo: EF 30-35%, Gr2 DD;  b. 08/2016 Echo: EF 40%.   Depression    Essential hypertension    GAD (generalized anxiety disorder)    GERD (gastroesophageal reflux disease)     Hypercholesteremia    Ischemic cardiomyopathy    a. prior EF 25-35 percent at cath, 35-40 percent by echo; b. 03/2016 Echo: EF 30-35%, diff HK, Gr2 DD; c. 08/2016 Echo: EF 40%, base/mid inferior/inferowseptal AK, mildly dil LA.   Morbid obesity ([place]    Pre-diabetes    SVT (supraventricular tachycardia) APRROX 10 YRS AGO   PRESUMED AVNRT, ECHO 12/07 WITH EF 65%, MILD LAE   Tobacco abuse    a. 20 + pack years, quit 10/2016.     Family History  Problem Relation Age of Onset   Arrhythmia Mother        MOTHER LIVED TO BE 102   Coronary artery disease Mother    Heart attack Mother    Hypertension Mother    Coronary artery disease Father [phone]  Heart disease Father    Heart attack Father    Heart attack Brother    Stroke Neg Hx      Social History:   reports that he quit smoking about 5 years ago. His smoking use included cigarettes. He has a 24.00 pack-year smoking history. He has never used smokeless tobacco. He reports current drug use. Drug: Marijuana. He reports that he does not drink alcohol.  Medications  Current Facility-Administered Medications:    diphenhydrAMINE (BENADRYL) injection 25 mg, 25 mg, Intravenous, Once, Kommor, Madison, MD   prochlorperazine (COMPAZINE) injection 10 mg, 10 mg, Intravenous, Once, Kommor, Madison, MD  Current Outpatient Medications:    acetaminophen (TYLENOL)  325 MG tablet, Take 2 tablets (650 mg total) by mouth every 4 (four) hours as needed for headache or mild pain., Disp: , Rfl:    aspirin 81 MG EC tablet, Take 1 tablet (81 mg total) by mouth daily., Disp: 90 tablet, Rfl: 3   atorvastatin (LIPITOR) 80 MG tablet, TAKE 1 TABLET BY MOUTH EVERY DAY AT 6 PM, Disp: 90 tablet, Rfl: 3   blood glucose meter kit and supplies, Dispense based on patient and insurance preference. Use up to four times daily as directed. (FOR ICD-10 E10.9, E11.9)., Disp: 1 each, Rfl: 0   buPROPion (WELLBUTRIN XL) 150 MG 24 hr tablet, TAKE 1 TABLET BY MOUTH EVERY DAY, Disp: 90  tablet, Rfl: 3   carvedilol (COREG) 25 MG tablet, Take 1 tablet (25 mg total) by mouth 2 (two) times daily., Disp: 60 tablet, Rfl: 3   clotrimazole-betamethasone (LOTRISONE) cream, Apply 1 application topically as needed., Disp: , Rfl:    dapagliflozin propanediol (FARXIGA) 10 MG TABS tablet, Take 1 tablet (10 mg total) by mouth daily before breakfast., Disp: 90 tablet, Rfl: 3   digoxin (LANOXIN) 0.125 MG tablet, Take 1 tablet (125 mcg total) by mouth daily., Disp: 90 tablet, Rfl: 3   Evolocumab (REPATHA SURECLICK) 601 MG/ML SOAJ, INJECT 1 PEN INTO THE SKIN EVERY 14 (FOURTEEN) DAYS., Disp: 2 mL, Rfl: 11   fenofibrate 160 MG tablet, Take 1 tablet (160 mg total) by mouth daily., Disp: 90 tablet, Rfl: 3   furosemide (LASIX) 40 MG tablet, Take 1 tablet (40 mg total) by mouth daily., Disp: 30 tablet, Rfl: 6   icosapent Ethyl (VASCEPA) 1 g capsule, Take 2 capsules (2 g total) by mouth 2 (two) times daily., Disp: 120 capsule, Rfl: 11   insulin glargine (LANTUS SOLOSTAR) 100 UNIT/ML Solostar Pen, Inject 20 Units into the skin daily., Disp: 15 mL, Rfl: 6   Insulin Pen Needle (PEN NEEDLES 3/16") 31G X 5 MM MISC, 1 Device by Other route daily in the afternoon. Lantus 23, Disp: 100 each, Rfl: 3   isosorbide-hydrALAZINE (BIDIL) 20-37.5 MG tablet, Take 1 tablet by mouth 3 (three) times daily., Disp: 90 tablet, Rfl: 1   metFORMIN (GLUCOPHAGE-XR) 500 MG 24 hr tablet, Take 1 tablet (500 mg total) by mouth 2 (two) times daily., Disp: 180 tablet, Rfl: 3   nitroGLYCERIN (NITROSTAT) 0.4 MG SL tablet, Place 1 tablet (0.4 mg total) under the tongue every 5 (five) minutes as needed for chest pain., Disp: 25 tablet, Rfl: 3   potassium chloride SA (KLOR-CON M) 20 MEQ tablet, Take 2 tablets (40 mEq total) by mouth daily., Disp: 180 tablet, Rfl: 3   sacubitril-valsartan (ENTRESTO) 97-103 MG, Take 1 tablet by mouth 2 (two) times daily., Disp: 60 tablet, Rfl: 11   Semaglutide, 2 MG/DOSE, (OZEMPIC, 2 MG/DOSE,) 8 MG/3ML SOPN,  Inject 2 mg into the skin once a week., Disp: 9 mL, Rfl: 3   spironolactone (ALDACTONE) 25 MG tablet, Take 1 tablet (25 mg total) by mouth daily., Disp: 90 tablet, Rfl: 3   triamcinolone cream (KENALOG) 0.1 %, Apply 1 application topically as needed., Disp: , Rfl:    Exam: Current vital signs: BP 133/88   Pulse 73   Temp 97.8 F (36.6 C)   Resp 15   SpO2 100%  Vital signs in last 24 hours: Temp:  [97.8 F (36.6 C)] 97.8 F (36.6 C) (12/05 1618) Pulse Rate:  [73-80] 73 (12/05 1708) Resp:  [15-22] 15 (12/05 1708) BP: (133-152)/(88-103) 133/88 (12/05 1708) SpO2:  [  100 %] 100 % (12/05 1708)  GENERAL: Awake, alert, in some distress due to anxiety Psych: Patient is anxious but cooperative with exam Head: Normocephalic and atraumatic, without obvious abnormality EENT: Normal conjunctivae, moist mucous membranes, no OP obstruction LUNGS: Normal respiratory effort. Non-labored breathing on room air Extremities: warm, well perfused, without obvious deformity  NEURO:  Mental Status: Awake, alert, and oriented to person, place, time, and situation. He is able to provide some history of present illness. Speech/Language: speech is clear and fluent.   Naming, fluency, and comprehension intact without aphasia  No neglect is noted Cranial Nerves:  II: PERRL right upper quadrantanopsia.  III, IV, VI: EOMI. Lid elevation symmetric and full.  V: Sensation is intact to light touch and symmetrical to face.  VII: Face is symmetric resting and smiling.  VIII: Hearing intact to voice IX, X: Phonation normal.  XI: Normal sternocleidomastoid and trapezius muscle strength XII: Tongue protrudes midline without fasciculations.   Motor: 5/5 strength is all muscle groups.  Tone is normal. Bulk is normal.  Sensation: Intact to light touch bilaterally in all four extremities. No extinction to DSS present.  Coordination: FTN intact bilaterally. No pronator drift.  Gait: Deferred  NIHSS: 1a Level of  Conscious.: 0 1b LOC Questions: 0 1c LOC Commands: 0 2 Best Gaze: 0 3 Visual: 1 4 Facial Palsy: 0 5a Motor Arm - left: 0 5b Motor Arm - Right: 0 6a Motor Leg - Left: 0 6b Motor Leg - Right: 0 7 Limb Ataxia: 0 8 Sensory:0  9 Best Language: 0 10 Dysarthria: 0 11 Extinct. and Inatten.: 0 TOTAL: 1   Labs I have reviewed labs in epic and the results pertinent to this consultation are:   CBC    Component Value Date/Time   WBC 11.7 (H) 07/19/2022 1630   RBC 5.99 (H) 07/19/2022 1630   HGB 18.0 (H) 07/19/2022 1645   HGB 15.3 07/02/2018 1011   HCT 53.0 (H) 07/19/2022 1645   HCT 44.5 07/02/2018 1011   PLT 239 07/19/2022 1630   PLT 270 07/02/2018 1011   MCV 85.6 07/19/2022 1630   MCV 85 07/02/2018 1011   MCH 30.2 07/19/2022 1630   MCHC 35.3 07/19/2022 1630   RDW 13.3 07/19/2022 1630   RDW 14.7 07/02/2018 1011   LYMPHSABS 3.8 07/19/2022 1630   MONOABS 1.0 07/19/2022 1630   EOSABS 0.3 07/19/2022 1630   BASOSABS 0.1 07/19/2022 1630    CMP     Component Value Date/Time   NA 141 07/19/2022 1645   NA 139 08/30/2019 1132   K 4.1 07/19/2022 1645   CL 102 07/19/2022 1645   CO2 26 07/05/2022 1537   GLUCOSE 105 (H) 07/19/2022 1645   BUN 22 (H) 07/19/2022 1645   BUN 15 08/30/2019 1132   CREATININE 1.60 (H) 07/19/2022 1645   CREATININE 1.05 04/05/2016 1144   CALCIUM 9.7 07/05/2022 1537   PROT 7.7 01/11/2022 1104   PROT 7.1 02/01/2021 1224   ALBUMIN 4.2 01/11/2022 1104   ALBUMIN 4.4 02/01/2021 1224   AST 19 01/11/2022 1104   ALT 21 01/11/2022 1104   ALKPHOS 61 01/11/2022 1104   BILITOT 0.5 01/11/2022 1104   BILITOT 0.2 02/01/2021 1224   GFRNONAA >60 07/05/2022 1537   GFRNONAA 87 04/05/2016 1144   GFRAA >60 05/11/2020 1246   GFRAA >89 04/05/2016 1144    Lipid Panel     Component Value Date/Time   CHOL 107 01/11/2022 1104   CHOL 121 02/01/2021 1224  TRIG 96 01/11/2022 1104   HDL 45 01/11/2022 1104   HDL 47 02/01/2021 1224   CHOLHDL 2.4 01/11/2022 1104   VLDL 19  01/11/2022 1104   LDLCALC 43 01/11/2022 1104   LDLCALC 47 02/01/2021 1224   LDLDIRECT 51 02/01/2021 1224     Imaging I have reviewed the images obtained:  CT-scan of the brain: No acute abnormality  CTA head and neck: Occlusion of distal left P2, moderate stenosis of right M4 branch  MRI examination of the brain: Pending  Assessment: 46 year old patient with history of CHF, CAD, MI, ischemic cardiomyopathy, hypertension smoking and diabetes presented with sudden severe headache and blurred vision in the right eye as well as anxiety.  Head CT was negative, however CTA revealed left P2 occlusion.  Patient's NIH score was 1, and only deficit was right upper quadrantanopsia, therefore symptoms were too mild to treat, and risks of TNK or mechanical thrombectomy would outweigh benefits.  Will admit patient for full stroke work-up.  Impression: Acute ischemic stroke in right PCA distribution  Recommendations: - Admit for stroke workup - Permissive HTN x48 hrs from sx onset or until stroke ruled out by MRI goal BP <220/110. PRN labetalol or hydralazine if BP above these parameters. Avoid oral antihypertensives. - MRI brain wo contrast - TTE  - Check A1c and LDL + add statin per guidelines -Aspirin 81 mg daily and Plavix 75 mg daily antiplt/anticoag - q4 hr neuro checks - STAT head CT for any change in neuro exam - Tele - PT/OT/SLP - Stroke education - Amb referral to neurology upon discharge    Pt seen by NP/Neuro and later by MD. Note/plan to be edited by MD as needed.  Tyler , MSN, AGACNP-BC [facility] See Amion for schedule and pager information 07/19/2022 5:49 PM    Attending Neurohospitalist Addendum Patient seen and examined with APP/Resident. Agree with the history and physical as documented above. Agree with the plan as documented, which I helped formulate. I have edited the note above to reflect my full findings and recommendations. I have  independently reviewed the chart, obtained history, review of systems and examined the patient.I have personally reviewed pertinent head/neck/spine imaging (CT/MRI). Please feel free to call with any questions.  Patient has hx migraines with visual aura, has had similar episodes in the past. Given persistent R eye upper outer quadrantopia and CTA findings will admit for stroke workup.  -- Su Monks, MD [facility] [phone]  If 7pm- 7am, please page neurology on call as listed in [place].

## 2022-07-19 NOTE — Assessment & Plan Note (Signed)
BP mildly elevated.  Plan Continue home medications

## 2022-07-19 NOTE — ED Triage Notes (Signed)
Pt reports sudden headache and vision changes about 30 mins ago. Pt diaphoretic and hyperventilating.  Reports blurred vision to right eye.

## 2022-07-19 NOTE — Assessment & Plan Note (Signed)
Patient on high dose statin therapy. He reports he is adherent.  Plan Lipid panel in AM  Continue home meds

## 2022-07-20 ENCOUNTER — Observation Stay (HOSPITAL_COMMUNITY): Payer: Medicare Other

## 2022-07-20 ENCOUNTER — Encounter (HOSPITAL_COMMUNITY): Payer: Self-pay | Admitting: Internal Medicine

## 2022-07-20 DIAGNOSIS — I255 Ischemic cardiomyopathy: Secondary | ICD-10-CM

## 2022-07-20 DIAGNOSIS — I639 Cerebral infarction, unspecified: Secondary | ICD-10-CM | POA: Diagnosis not present

## 2022-07-20 DIAGNOSIS — I63332 Cerebral infarction due to thrombosis of left posterior cerebral artery: Secondary | ICD-10-CM | POA: Diagnosis not present

## 2022-07-20 DIAGNOSIS — Z794 Long term (current) use of insulin: Secondary | ICD-10-CM

## 2022-07-20 DIAGNOSIS — E785 Hyperlipidemia, unspecified: Secondary | ICD-10-CM

## 2022-07-20 DIAGNOSIS — R809 Proteinuria, unspecified: Secondary | ICD-10-CM

## 2022-07-20 DIAGNOSIS — I10 Essential (primary) hypertension: Secondary | ICD-10-CM

## 2022-07-20 DIAGNOSIS — E1129 Type 2 diabetes mellitus with other diabetic kidney complication: Secondary | ICD-10-CM | POA: Diagnosis not present

## 2022-07-20 LAB — URINALYSIS, ROUTINE W REFLEX MICROSCOPIC
Bacteria, UA: NONE SEEN
Bilirubin Urine: NEGATIVE
Glucose, UA: 150 mg/dL — AB
Hgb urine dipstick: NEGATIVE
Ketones, ur: NEGATIVE mg/dL
Leukocytes,Ua: NEGATIVE
Nitrite: NEGATIVE
Protein, ur: 100 mg/dL — AB
Specific Gravity, Urine: 1.033 — ABNORMAL HIGH (ref 1.005–1.030)
pH: 5 (ref 5.0–8.0)

## 2022-07-20 LAB — ETHANOL: Alcohol, Ethyl (B): <10 mg/dL (ref <10)

## 2022-07-20 LAB — GLUCOSE, CAPILLARY
Glucose-Capillary: 159 mg/dL — ABNORMAL HIGH (ref 70–99)
Glucose-Capillary: 153 mg/dL — ABNORMAL HIGH (ref 70–99)
Glucose-Capillary: 116 mg/dL — ABNORMAL HIGH (ref 70–99)

## 2022-07-20 LAB — RAPID URINE DRUG SCREEN, HOSP PERFORMED
Amphetamines: NOT DETECTED
Barbiturates: NOT DETECTED
Benzodiazepines: NOT DETECTED
Cocaine: NOT DETECTED
Opiates: NOT DETECTED
Tetrahydrocannabinol: POSITIVE — AB

## 2022-07-20 LAB — LIPID PANEL
Cholesterol: 169 mg/dL (ref 0–200)
HDL: 44 mg/dL (ref >40)
LDL Cholesterol: 105 mg/dL — ABNORMAL HIGH (ref 0–99)
Total CHOL/HDL Ratio: 3.8 RATIO
Triglycerides: 100 mg/dL (ref <150)
VLDL: 20 mg/dL (ref 0–40)

## 2022-07-20 LAB — HEMOGLOBIN A1C
Hgb A1c MFr Bld: 5.9 % — ABNORMAL HIGH (ref 4.8–5.6)
Mean Plasma Glucose: 123 mg/dL

## 2022-07-20 LAB — BRAIN NATRIURETIC PEPTIDE: B Natriuretic Peptide: 180.3 pg/mL — ABNORMAL HIGH (ref 0.0–100.0)

## 2022-07-20 MED ORDER — CLOPIDOGREL BISULFATE 75 MG PO TABS: 75.0000 mg | ORAL_TABLET | Freq: Every day | ORAL | 1 refills | Status: AC

## 2022-07-20 MED ORDER — ACETAMINOPHEN 500 MG PO TABS
1000.0000 mg | ORAL_TABLET | Freq: Four times a day (QID) | ORAL | Status: DC | PRN
  Administered 2022-07-20: 1000 mg via ORAL
  Filled 2022-07-20: qty 2
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
## 2022-07-20 NOTE — Progress Notes (Addendum)
STROKE TEAM PROGRESS NOTE   INTERVAL HISTORY No family is at the bedside.  He is sititng in the chair in NAD. Oriented Has right hemianopia. Follows commands with 5/5 strength in all extremities without ataxia. However, unable to remember 3 words in 3-word recall   Recommend ASA/Plavix for 3 weeks than Plavix Recommend loop recorder unless he is getting the ICD insertion soon.  12/5: ED with blurred vision, headache.  MRI shows large left PCA infarct.   Vitals:   07/20/22 0328 07/20/22 0722 07/20/22 0845 07/20/22 1147  BP: (!) 107/52 (!) 84/46 96/66 110/68  Pulse:  (!) 57  (!) 58  Resp: 15 18  18   Temp: 98.4 F (36.9 C) 97.8 F (36.6 C)  97.6 F (36.4 C)  TempSrc: Oral Oral  Oral  SpO2: 99% 96%  97%  Weight: 106.4 kg     Height: 5\' 7"  (1.702 m)      CBC:  Recent Labs  Lab 07/19/22 1630 07/19/22 1645  WBC 11.7*  --   NEUTROABS 6.5  --   HGB 18.1* 18.0*  HCT 51.3 53.0*  MCV 85.6  --   PLT 239  --     Basic Metabolic Panel:  Recent Labs  Lab 07/19/22 1630 07/19/22 1645  NA 139 141  K 3.6 4.1  CL 102 102  CO2 25  --   GLUCOSE 104* 105*  BUN 17 22*  CREATININE 1.62* 1.60*  CALCIUM 10.1  --     Lipid Panel:  Recent Labs  Lab 07/20/22 0337  CHOL 169  TRIG 100  HDL 44  CHOLHDL 3.8  VLDL 20  LDLCALC 14/05/23*    HgbA1c: No results for input(s): "HGBA1C" in the last 168 hours. Urine Drug Screen:  Recent Labs  Lab 07/20/22 0326  LABOPIA NONE DETECTED  COCAINSCRNUR NONE DETECTED  LABBENZ NONE DETECTED  AMPHETMU NONE DETECTED  THCU POSITIVE*  LABBARB NONE DETECTED     Alcohol Level  Recent Labs  Lab 07/20/22 0337  ETH <10     IMAGING past 24 hours MR BRAIN WO CONTRAST  Result Date: 07/19/2022 CLINICAL DATA:  Initial evaluation for neuro deficit, stroke. EXAM: MRI HEAD WITHOUT CONTRAST TECHNIQUE: Multiplanar, multiecho pulse sequences of the brain and surrounding structures were obtained without intravenous contrast. COMPARISON:  Prior CTs from  earlier the same day. FINDINGS: Brain: Examination technically limited as the patient was unable to tolerate the full length of the exam. Additionally, provided images are degraded by motion. Cerebral volume within normal limits. No significant cerebral white matter disease for age. Confluent restricted diffusion involving the mesial left temporal occipital region, consistent with an evolving left PCA distribution infarct. Finding is in keeping with the previously identified left P2 occlusion. No significant regional mass effect. No definite associated hemorrhage, although evaluation limited as and SWI sequence is not available for review. No other evidence for acute or subacute ischemia. No mass lesion, midline shift or mass effect. No hydrocephalus or extra-axial fluid collection. Vascular: Major intracranial vascular flow voids are grossly maintained. Skull and upper cervical spine: Craniocervical junction grossly within normal limits. Bone marrow signal intensity grossly normal. No scalp soft tissue abnormality. Sinuses/Orbits: Globes orbital soft tissues demonstrate no acute finding. Paranasal sinuses are largely clear. No significant mastoid effusion. Other: None. IMPRESSION: 1. Technically limited exam due to the patient's inability to tolerate the full length of the study and motion artifact. 2. Moderate-sized acute ischemic left PCA distribution infarct, in keeping with the previously identified left P2  occlusion. No significant regional mass effect. 3. No other acute intracranial abnormality. Electronically Signed   By: Rise Mu M.D.   On: 07/19/2022 21:45   DG Chest 2 View  Result Date: 07/19/2022 CLINICAL DATA:  [id number] CVA (cerebral vascular accident) [facility]) [id number] EXAM: CHEST - 2 VIEW COMPARISON:  07/03/2020 FINDINGS: The lungs are symmetrically well expanded. Reticular opacities at the right lung base are nonspecific, possibly infectious or inflammatory in the acute setting. No  pneumothorax or pleural effusion. Cardiac size within normal limits. Pulmonary vascularity is normal. No acute bone abnormality. IMPRESSION: 1. Reticular opacities at the right lung base, possibly infectious or inflammatory in the acute setting. Electronically Signed   By: Helyn Numbers M.D.   On: 07/19/2022 20:25   CT ANGIO HEAD NECK W WO CM (CODE STROKE)  Result Date: 07/19/2022 CLINICAL DATA:  Stroke suspected EXAM: CT ANGIOGRAPHY HEAD AND NECK TECHNIQUE: Multidetector CT imaging of the head and neck was performed using the standard protocol during bolus administration of intravenous contrast. Multiplanar CT image reconstructions and MIPs were obtained to evaluate the vascular anatomy. Carotid stenosis measurements (when applicable) are obtained utilizing NASCET criteria, using the distal internal carotid diameter as the denominator. RADIATION DOSE REDUCTION: This exam was performed according to the departmental dose-optimization program which includes automated exposure control, adjustment of the mA and/or kV according to patient size and/or use of iterative reconstruction technique. CONTRAST:  41mL OMNIPAQUE IOHEXOL 350 MG/ML SOLN COMPARISON:  No prior CTA available; correlation is made with same day CT head FINDINGS: CT HEAD FINDINGS For noncontrast findings, please see same day CT head. CTA NECK FINDINGS Aortic arch: Standard branching. Imaged portion shows no evidence of aneurysm or dissection. No significant stenosis of the major arch vessel origins. Right carotid system: No evidence of stenosis, dissection, or occlusion. Left carotid system: No evidence of stenosis, dissection, or occlusion. Vertebral arteries: No evidence of stenosis, dissection, or occlusion. Skeleton: No acute osseous abnormality. Other neck: Negative. Upper chest: Negative. Review of the MIP images confirms the above findings CTA HEAD FINDINGS Anterior circulation: Both internal carotid arteries are patent to the termini, without  significant stenosis. A1 segments patent. Normal anterior communicating artery. Anterior cerebral arteries are patent to their distal aspects. No M1 stenosis or occlusion. Moderate stenosis in a right M4 branch (series 6, images 252-254) and diminished opacification of downstream branch vessels. Distal MCA branches are otherwise perfused without significant stenosis. Posterior circulation: Vertebral arteries patent to the vertebrobasilar junction without stenosis. Posterior inferior cerebellar arteries patent proximally. Basilar patent to its distal aspect. Superior cerebellar arteries patent proximally. Patent P1 segments. Occlusion of the distal left P2 (series 6, images 228-230). The right PCA is patent to its distal aspects. Venous sinuses: Not well opacified. Anatomic variants: None significant. Review of the MIP images confirms the above findings IMPRESSION: 1. Occlusion of the distal left P2. 2. Moderate stenosis in a right M4 branch with diminished opacification of downstream branch vessels. 3. No hemodynamically significant stenosis in the neck. Code stroke imaging results were communicated on 07/19/2022 at 5:14 pm to provider STACK via telephone, who verbally acknowledged these results. Electronically Signed   By: Wiliam Ke M.D.   On: 07/19/2022 17:15   CT HEAD CODE STROKE WO CONTRAST  Result Date: 07/19/2022 CLINICAL DATA:  Code stroke. EXAM: CT HEAD WITHOUT CONTRAST TECHNIQUE: Contiguous axial images were obtained from the base of the skull through the vertex without intravenous contrast. RADIATION DOSE REDUCTION: This exam was performed according to the  departmental dose-optimization program which includes automated exposure control, adjustment of the mA and/or kV according to patient size and/or use of iterative reconstruction technique. COMPARISON:  None Available. FINDINGS: Brain: No evidence of acute infarction, hemorrhage, cerebral edema, mass, mass effect, or midline shift. No hydrocephalus  or extra-axial collection. Vascular: No hyperdense vessel. Skull: Negative for fracture or focal lesion. Sinuses/Orbits: Mild mucosal thickening in the left maxillary sinus. Otherwise clear paranasal sinuses. No acute finding in the orbits. Other: The mastoid air cells are well aerated. ASPECTS [facility]([place] Stroke Program Early CT Score) - Ganglionic level infarction (caudate, lentiform nuclei, internal capsule, insula, M1-M3 cortex): 7 - Supraganglionic infarction (M4-M6 cortex): 3 Total score (0-10 with 10 being normal): 10 IMPRESSION: No acute intracranial process.  ASPECTS is 10. Code stroke imaging results were communicated on 07/19/2022 at 4:48 pm to provider STACK via secure text paging. Electronically Signed   By: Wiliam KeAlison  Vasan M.D.   On: 07/19/2022 16:49    PHYSICAL EXAM  Temp:  [97.6 F (36.4 C)-98.8 F (37.1 C)] 97.6 F (36.4 C) (12/06 1147) Pulse Rate:  [52-80] 58 (12/06 1147) Resp:  [13-32] 18 (12/06 1147) BP: (84-152)/(46-109) 110/68 (12/06 1147) SpO2:  [95 %-100 %] 97 % (12/06 1147) Weight:  [106.4 kg-109.6 kg] 106.4 kg (12/06 0328)  General - Well nourished, well developed, in no apparent distress.  Cardiovascular - Regular rhythm and rate.  Mental Status -  Level of arousal and orientation to time, place, and person were intact. 3-word recall test: unable to recall any of the words.   Cranial Nerves II - XII - II -R hemianopsia  III, IV, VI - Extraocular movements intact. V - Facial sensation intact bilaterally. VII - Facial movement intact bilaterally. VIII - Hearing & vestibular intact bilaterally. X - Palate elevates symmetrically. XI - Chin turning & shoulder shrug intact bilaterally. XII - Tongue protrusion intact.  Motor Strength - The patient's strength was normal in all extremities and pronator drift was absent.  Bulk was normal and fasciculations were absent.   Motor Tone - Muscle tone was assessed at the neck and appendages and was normal.  Sensory - Light  touch, temperature/pinprick were assessed and were symmetrical.    Coordination - The patient had normal movements in the hands and feet with no ataxia or dysmetria.  Tremor was absent.  Gait and Station - deferred.  ASSESSMENT/PLAN [name] is a 46 y.o. male with history of chronic HFrEF, CAD-s/p multiple PCI's, HTN, DM-2-who presented with headache, blurred vision-upon further evaluation-was found to have acute L PCA infarct. R hemianopsia present on assessment.  Recommend ASA/Plavix x 3weeks, than Plavix alone. Loop recorder at discharge, discussed with Cardiology/HF.   Stroke, Ischemic left PCA infarct due to left P2 occlusion Etiology:  suspected embolic event d/t hx if ischemic cardiomyopathy with EF less than 20% versus paroxysmal A-fib Code Stroke CT head: No acute intracranial process. ASPECTS is 10.  CTA head & neck  1. Occlusion of the distal left P2. 2. Moderate stenosis in a right M4 branch with diminished opacification of downstream branch vessels. 3. No hemodynamically significant stenosis in the neck.  MRI  1. Technically limited exam due to the patient's inability to tolerate the full length of the study and motion artifact. 2. Moderate-sized acute ischemic left PCA distribution infarct, in keeping with the previously identified left P2 occlusion. Non-significant regional mass effect. 3. No other acute intracranial abnormality.  2D Echo 04/20/2022 EF less than 20% with grade 3 diastolic  dysfunction and severe kinesis  LDL 105 HgbA1c 5.7 VTE prophylaxis - lovenox    Diet   Diet Carb Modified Fluid consistency: Thin; Room service appropriate? Yes   aspirin 81 mg daily prior to admission, now on aspirin 81 mg daily and clopidogrel 75 mg daily. Recommend ASA/Plavix x 3weeks, then Plavix alone.  Therapy recommendations:  Outpatient PT Disposition:  home  Hypertension  Permissive hypertension (OK if < 220/120) but gradually normalize in 5-7 days Long-term BP  goal normotensive  Hyperlipidemia Home meds:  atorvastatin, resumed in hospital LDL 105, goal < 70 Continue statin at discharge  Diabetes type II Controlled, lung-term insulin use Home meds:  Semglee HgbA1c 5.7, goal < 7.0 CBGs Recent Labs    07/19/22 1625 07/20/22 0620 07/20/22 1206  GLUCAP 104* 159* 153*     SSI ACHS  Other Stroke Risk Factors Cigarette smoker, advised to stop smoking. Cessation education ordered.  Substance abuse - UDS:  THC POSITIVE, Cocaine NONE DETECTED. Patient advised to stop using due to stroke risk. Obesity, Body mass index is 36.74 kg/m., BMI >/= 30 associated with increased stroke risk, recommend weight loss, diet and exercise as appropriate  Coronary artery disease Obstructive sleep apnea?  Congestive heart failure  Other Active Problems (per Primary Team, Cardiology) HFrEF, Ischemic Cardiomyopathy Followed in HF clinic (most recent visit 07/05/22) GERD Generalized anxiety disorder AKI  Hospital day # 0  E. Mauri Reading, DNP, AGACNP-BC [facility] Pager: [phone]   STROKE MD NOTE :  I have personally obtained history,examined this patient, reviewed notes, independently viewed imaging studies, participated in medical decision making and plan of care.ROS completed by me personally and pertinent positives fully documented  I have made any additions or clarifications directly to the above note. Agree with note above.  Patient presented with vision difficulties due to embolic left PCA infarct due to left P2 occlusion likely from cardioembolic source from his cardiomyopathy or paroxysmal A-fib.  Neurological exam shows right homonymous hemianopsia.  Recommend dual antiplatelet therapy aspirin and Plavix for 3 weeks followed by Plavix alone and aggressive risk factor modification.  Patient may need prolonged cardiac monitoring to look for paroxysmal A-fib at discharge and cardiology to consider loop recorder versus ICD given his low  ejection fraction.  Patient advised not to drive till peripheral vision loss improves.  Long discussion with patient, his fiance and sister at the bedside and answered questions.  Greater than 50% time during this 50-minute visit was spent in counseling and coordination of care about his embolic stroke and peripheral vision loss and discussion about stroke prevention and treatment and answering questions.  Discussed with Dr.Ghimire.  Stroke team will sign off.  Follow-up as an outpatient stroke clinic with nurse practitioner in 2 months or call earlier if necessary  Delia Heady, MD Medical Director [facility] Pager: [phone] 07/20/2022 4:48 PM  To contact Stroke Continuity provider, please refer to [email or web address]. After hours, contact General Neurology

## 2022-07-20 NOTE — Evaluation (Signed)
Physical Therapy Evaluation Patient Details Name: Peter Russo MRN: [id number] DOB: Feb 11, 1976 Today's Date: 07/20/2022  History of Present Illness  Pt is a 46 y/o M presenting with headache, confusion, anxiety and difficulty speaking along with blurred vision in the right eye resulting in acute CVA right PCA. PMH includes MI, CAD, CHF, ischemic cardiomyopathy, hypertension, smoking, and diabetes.  Clinical Impression  Received pt semi-reclined in bed and agreeable to PT evaluation. Pt reports living with fiance who works and can provide intermittent assist upon D/C, in 2nd story apartment with 1 flight of steps to enter. Pt performed bed mobility mod I and transfers without AD and mod I (due to need to move cautiously and slowly due to intermittent dizziness). Pt ambulated through hallway with min guard fading to supervision and navigated 3 6in steps and 6 3in steps with R handrail and supervision to simulate home entry. Pt emotional regarding current state and educated on BEFAST signs/symptoms. Pt left in recliner with all needs within reach and NT updated on pt's current mobility status. Pt would benefit from OPPT to continue working on balance and endurance. Acute PT to cont to follow.      Recommendations for follow up therapy are one component of a multi-disciplinary discharge planning process, led by the attending physician.  Recommendations may be updated based on patient status, additional functional criteria and insurance authorization.  Follow Up Recommendations Outpatient PT      Assistance Recommended at Discharge Set up Supervision/Assistance  Patient can return home with the following  A little help with walking and/or transfers;Assist for transportation;Help with stairs or ramp for entrance    Equipment Recommendations None recommended by PT  Recommendations for Other Services       Functional Status Assessment Patient has had a recent decline in their functional status  and demonstrates the ability to make significant improvements in function in a reasonable and predictable amount of time.     Precautions / Restrictions Precautions Precautions: Fall Precaution Comments: dizziness Restrictions Weight Bearing Restrictions: No      Mobility  Bed Mobility Overal bed mobility: Modified Independent             General bed mobility comments: HOB elevated Patient Response: Cooperative  Transfers Overall transfer level: Modified independent Equipment used: None               General transfer comment: stood from EOB without AD (slow/cautious to rise due to low BP earlier this morning)    Ambulation/Gait Ambulation/Gait assistance: Supervision, Min guard Gait Distance (Feet): 200 Feet Assistive device: None Gait Pattern/deviations: Step-through pattern, Narrow base of support Gait velocity: slightly decreased Gait velocity interpretation: 1.31 - 2.62 ft/sec, indicative of limited community ambulator   General Gait Details: Pt initally slightly unstable, but improved with continued ambulation. Pt reported slight dizziness that improved with standing/seated rest breaks,  Stairs Stairs: Yes Stairs assistance: Supervision Stair Management: One rail Right, Alternating pattern Number of Stairs: 3 (3 6in steps and 6 3in steps with R handrail)    Wheelchair Mobility    Modified Rankin (Stroke Patients Only) Modified Rankin (Stroke Patients Only) Pre-Morbid Rankin Score: No symptoms Modified Rankin: Moderate disability     Balance Overall balance assessment: Needs assistance Sitting-balance support: Feet supported, No upper extremity supported Sitting balance-Leahy Scale: Normal     Standing balance support: No upper extremity supported, During functional activity Standing balance-Leahy Scale: Good Standing balance comment: able to maintain static standing balance with mod I but required  supervision for dynamic standing balance                              Pertinent Vitals/Pain Pain Assessment Pain Assessment: No/denies pain    Home Living Family/patient expects to be discharged to:: Private residence Living Arrangements: Spouse/significant other (fiance works - available intermittently) Available Help at Discharge: Available PRN/intermittently;Family Type of Home: Apartment Home Access: Stairs to enter Entrance Stairs-Rails: Psychiatric nurse of Steps: flight   Home Layout: One level Home Equipment: None      Prior Function Prior Level of Function : Independent/Modified Independent                     Hand Dominance   Dominant Hand: Right    Extremity/Trunk Assessment   Upper Extremity Assessment Upper Extremity Assessment: Defer to OT evaluation    Lower Extremity Assessment Lower Extremity Assessment: Overall WFL for tasks assessed    Cervical / Trunk Assessment Cervical / Trunk Assessment: Normal  Communication   Communication: No difficulties  Cognition Arousal/Alertness: Awake/alert Behavior During Therapy: WFL for tasks assessed/performed Overall Cognitive Status: Within Functional Limits for tasks assessed                                 General Comments: pt unaware that he had CVA and very emotional at end of session regarding current situation. Provided emotional support and therapeutic listening and educated pt on BEFAST signs/symptoms of CVA        General Comments      Exercises     Assessment/Plan    PT Assessment Patient needs continued PT services  PT Problem List Decreased activity tolerance;Decreased balance;Decreased coordination;Decreased mobility       PT Treatment Interventions Gait training;Stair training;Functional mobility training;Therapeutic activities;Therapeutic exercise;Patient/family education;Neuromuscular re-education;Balance training    PT Goals (Current goals can be found in the Care Plan section)   Acute Rehab PT Goals Patient Stated Goal: to get better PT Goal Formulation: With patient Time For Goal Achievement: 07/27/22 Potential to Achieve Goals: Good    Frequency Min 4X/week     Co-evaluation               AM-PAC PT "6 Clicks" Mobility  Outcome Measure Help needed turning from your back to your side while in a flat bed without using bedrails?: None Help needed moving from lying on your back to sitting on the side of a flat bed without using bedrails?: None Help needed moving to and from a bed to a chair (including a wheelchair)?: A Little Help needed standing up from a chair using your arms (e.g., wheelchair or bedside chair)?: None Help needed to walk in hospital room?: A Little Help needed climbing 3-5 steps with a railing? : A Little 6 Click Score: 21    End of Session Equipment Utilized During Treatment: Gait belt Activity Tolerance: Patient tolerated treatment well;Other (comment) (pt became emotional) Patient left: in chair;with call bell/phone within reach;Other (comment) (NT notified of pt's current status)   PT Visit Diagnosis: Unsteadiness on feet (R26.81);Dizziness and giddiness (R42)    Time: [id number] PT Time Calculation (min) (ACUTE ONLY): 24 min   Charges:   PT Evaluation $PT Eval Low Complexity: 1 Low PT Treatments $Gait Training: 8-22 mins        Becky Sax PT, DPT  Blenda Nicely 07/20/2022, 10:19 AM

## 2022-07-20 NOTE — Plan of Care (Signed)
  Problem: Education: Goal: Ability to describe self-care measures that may prevent or decrease complications (Diabetes Survival Skills Education) will improve Outcome: Progressing Goal: Individualized Educational Video(s) Outcome: Progressing   Problem: Coping: Goal: Ability to adjust to condition or change in health will improve Outcome: Progressing   Problem: Fluid Volume: Goal: Ability to maintain a balanced intake and output will improve Outcome: Progressing   Problem: Health Behavior/Discharge Planning: Goal: Ability to identify and utilize available resources and services will improve Outcome: Progressing Goal: Ability to manage health-related needs will improve Outcome: Progressing   Problem: Metabolic: Goal: Ability to maintain appropriate glucose levels will improve Outcome: Progressing   Problem: Nutritional: Goal: Maintenance of adequate nutrition will improve Outcome: Progressing Goal: Progress toward achieving an optimal weight will improve Outcome: Progressing   Problem: Skin Integrity: Goal: Risk for impaired skin integrity will decrease Outcome: Progressing   Problem: Tissue Perfusion: Goal: Adequacy of tissue perfusion will improve Outcome: Progressing   Problem: Education: Goal: Knowledge of General Education information will improve Description: Including pain rating scale, medication(s)/side effects and non-pharmacologic comfort measures Outcome: Progressing   Problem: Health Behavior/Discharge Planning: Goal: Ability to manage health-related needs will improve Outcome: Progressing   Problem: Clinical Measurements: Goal: Ability to maintain clinical measurements within normal limits will improve Outcome: Progressing Goal: Will remain free from infection Outcome: Progressing Goal: Diagnostic test results will improve Outcome: Progressing Goal: Respiratory complications will improve Outcome: Progressing Goal: Cardiovascular complication will  be avoided Outcome: Progressing   Problem: Activity: Goal: Risk for activity intolerance will decrease Outcome: Progressing   Problem: Nutrition: Goal: Adequate nutrition will be maintained Outcome: Progressing   Problem: Coping: Goal: Level of anxiety will decrease Outcome: Progressing   Problem: Elimination: Goal: Will not experience complications related to bowel motility Outcome: Progressing   Problem: Pain Managment: Goal: General experience of comfort will improve Outcome: Progressing   Problem: Safety: Goal: Ability to remain free from injury will improve Outcome: Progressing   Problem: Skin Integrity: Goal: Risk for impaired skin integrity will decrease Outcome: Progressing   Problem: Elimination: Goal: Will not experience complications related to urinary retention Outcome: Completed/Met
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
## 2022-07-20 NOTE — Evaluation (Signed)
Occupational Therapy Evaluation Patient Details Name: Peter Russo MRN: [id number] DOB: [birth year] Today's Date: 07/20/2022   History of Present Illness 46 y/o M presenting with headache, confusion, anxiety and difficulty speaking along with blurred vision in the right eye resulting in acute CVA right PCA. PMH includes MI, CAD, CHF, ischemic cardiomyopathy, hypertension, smoking, and diabetes.   Clinical Impression   PT admitted with L PCA infarct with new R visual field deficits. Pt currently with functional limitiations due to the deficits listed below (see OT problem list). Pt indep with all adls but does not work currently on disability. Pt very liable in session and making statement "why me". Pt educated on risk factors and request made to RN to give stroke booklet education. Pt reports "I have questions but I am scared to ask them." The pt was shown compensatory strategies to help pt read during session. The booklet may help the patient have answers to questions he is fearful to answer. OT to continue to work on strategies with patient to compensate for visual deficits.  Pt will benefit from skilled OT to increase their independence and safety with adls and balance to allow discharge outpatient OT.      Recommendations for follow up therapy are one component of a multi-disciplinary discharge planning process, led by the attending physician.  Recommendations may be updated based on patient status, additional functional criteria and insurance authorization.   Follow Up Recommendations  Outpatient OT     Assistance Recommended at Discharge Set up Supervision/Assistance  Patient can return home with the following Assist for transportation;Assistance with cooking/housework    Functional Status Assessment  Patient has had a recent decline in their functional status and demonstrates the ability to make significant improvements in function in a reasonable and predictable amount of time.   Equipment Recommendations  None recommended by OT    Recommendations for Other Services       Precautions / Restrictions Precautions Precautions: Fall Restrictions Weight Bearing Restrictions: No      Mobility Bed Mobility               General bed mobility comments: oob on arrival    Transfers Overall transfer level: Modified independent                        Balance                                           ADL either performed or assessed with clinical judgement   ADL Overall ADL's : Needs assistance/impaired Eating/Feeding: Set up   Grooming: Set up   Upper Body Bathing: Set up   Lower Body Bathing: Set up   Upper Body Dressing : Set up   Lower Body Dressing: Set up   Toilet Transfer: Min guard             General ADL Comments: pt educated on compensatory strategies of shape recognition such as a plate is a circle shape or round. tracking all the edges to ensure pt sees all sides. pt educated on head rotation to help with seeing all aspects. pt reports seeing half of OT face ( one eye  when asked). pt return demo of turning head to see the OT fully. pt educated on using finger and head roation to help with reading.     Vision  Baseline Vision/History: 0 No visual deficits Ability to See in Adequate Light: 3 Highly impaired Vision Assessment?: Yes Eye Alignment: Within Functional Limits Ocular Range of Motion: Within Functional Limits Alignment/Gaze Preference: Head tilt Saccades: Impaired - to be further tested in functional context Convergence: Impaired - to be further tested in functional context Visual Fields: Right visual field deficit Additional Comments: pt demonstrates visual field deficits in the R visual field ( quadrant) pt educated that visual loss will require compensatory strategies for safety and maximize indep. Pt could benefit from eye doctor follow up in the furture 48 months for futher  evaluation. Pt will need further educatoin regaridng driving restrictions.. pt very liable during session when discussion visual findings and attempting to read to help with self awareness     Perception     Praxis      Pertinent Vitals/Pain Pain Assessment Pain Assessment: Faces Faces Pain Scale: Hurts little more Pain Location: head Pain Descriptors / Indicators: Headache Pain Intervention(s): Monitored during session, RN gave pain meds during session     Hand Dominance Right   Extremity/Trunk Assessment Upper Extremity Assessment Upper Extremity Assessment: Overall WFL for tasks assessed   Lower Extremity Assessment Lower Extremity Assessment: Defer to PT evaluation   Cervical / Trunk Assessment Cervical / Trunk Assessment: Normal   Communication Communication Communication: No difficulties   Cognition Arousal/Alertness: Awake/alert Behavior During Therapy: WFL for tasks assessed/performed Overall Cognitive Status: Impaired/Different from baseline Area of Impairment: Awareness                           Awareness: Anticipatory   General Comments: pt reports having questions and not wanting to ask due to fear of the answer from MD staff.. Pt asking repetitive questions regarding vision. pt with good return demo when guided through visual compensatory strategies     General Comments       Exercises     Shoulder Instructions      Home Living Family/patient expects to be discharged to:: Private residence Living Arrangements: Spouse/significant other Available Help at Discharge: Available PRN/intermittently;Family Type of Home: Apartment Home Access: Stairs to enter Secretary/administrator of Steps: flight Entrance Stairs-Rails: Right;Left Home Layout: One level     Bathroom Shower/Tub: Theme park manager: Yes How Accessible: Accessible via wheelchair Home Equipment: None          Prior  Functioning/Environment Prior Level of Function : Independent/Modified Independent               ADLs Comments: pt reports being on disability and helping family with iadls such as mowing / yard work        OT Problem List: Impaired balance (sitting and/or standing);Impaired vision/perception;Decreased safety awareness;Decreased knowledge of use of DME or AE;Decreased knowledge of precautions;Decreased cognition      OT Treatment/Interventions: Self-care/ADL training;Therapeutic exercise;Neuromuscular education;Therapeutic activities;Cognitive remediation/compensation;Visual/perceptual remediation/compensation;Patient/family education;Balance training    OT Goals(Current goals can be found in the care plan section) Acute Rehab OT Goals Patient Stated Goal: to be able to read normal again OT Goal Formulation: With patient Time For Goal Achievement: 08/03/22 Potential to Achieve Goals: Good  OT Frequency: Min 2X/week    Co-evaluation              AM-PAC OT "6 Clicks" Daily Activity     Outcome Measure Help from another person eating meals?: None Help from another person taking care of personal grooming?: None Help  from another person toileting, which includes using toliet, bedpan, or urinal?: None Help from another person bathing (including washing, rinsing, drying)?: None Help from another person to put on and taking off regular upper body clothing?: None Help from another person to put on and taking off regular lower body clothing?: None 6 Click Score: 24   End of Session Nurse Communication: Mobility status;Precautions  Activity Tolerance: Patient tolerated treatment well Patient left: in chair;with call bell/phone within reach;with nursing/sitter in room                   Time: 1400-1426 OT Time Calculation (min): 26 min Charges:  OT General Charges $OT Visit: 1 Visit OT Evaluation $OT Eval Moderate Complexity: 1 Mod  Brynn, OTR/L  Acute Rehabilitation  Services Office: [phone] .  Mateo Flow 07/20/2022, 4:15 PM

## 2022-07-20 NOTE — Progress Notes (Addendum)
PROGRESS NOTE        PATIENT DETAILS Name: Peter Russo Age: 46 y.o. Sex: male Date of Birth: 1976-07-03 Admit Date: 07/19/2022 Admitting Physician Jacques Navy, MD [id number], No Pcp Per  Brief Summary: Patient is a 46 y.o.  male with history of chronic HFrEF, CAD-s/p multiple PCI's, HTN, DM-2-who presented with headache, blurred vision-upon further evaluation-was found to have acute CVA.  Significant events: 12/5>> admit to [facility]-acute CVA  Significant studies: 09/6>> echo: EF<20 percent 12/5>> MRI brain: Moderate acute left PCA infarct. 12/5>> CTA head/neck: Occlusion of distal left P2. 12/5>> UDS: Positive for tetrahydrocannabinol 12/6>> LDL: 105 12/6>> A1c: Pending  Significant microbiology data: None  Procedures: None  Consults: Neurology EP  Subjective: Lying comfortably in bed-denies any chest pain or shortness of breath.  Objective: Vitals: Blood pressure 96/66, pulse (!) 57, temperature 97.8 F (36.6 C), temperature source Oral, resp. rate 18, height 5\' 7"  (1.702 m), weight 106.4 kg, SpO2 96 %.   Exam: Gen Exam:Alert awake-not in any distress HEENT:atraumatic, normocephalic Chest: B/L clear to auscultation anteriorly CVS:S1S2 regular Abdomen:soft non tender, non distended Extremities:no edema Neurology: Non focal Skin: no rash  Pertinent Labs/Radiology:    Latest Ref Rng & Units 07/19/2022    4:45 PM 07/19/2022    4:30 PM 07/03/2020    8:21 PM  CBC  WBC 4.0 - 10.5 K/uL  11.7  11.8   Hemoglobin 13.0 - 17.0 g/dL 07/05/2020  01.0  27.2   Hematocrit 39.0 - 52.0 % 53.0  51.3  44.3   Platelets 150 - 400 K/uL  239  222     Lab Results  Component Value Date   NA 141 07/19/2022   K 4.1 07/19/2022   CL 102 07/19/2022   CO2 25 07/19/2022      Assessment/Plan: Acute ischemic infarct involving left PCA territory Suspicion for embolic event given ischemic cardiomyopathy Continues to have right upper quadrantanopsia-but  no other deficits. No A-fib on telemetry monitoring overnight On aspirin/Plavix/statin Await echo Discussed with neurology-EP consult for loop recorder placement.  Ischemic cardiomyopathy Chronic HFrEF Euvolemic Continue with Entresto/Aldactone/Lasix/Imdur/Coreg/digoxin Per prior notes-ICD implantation has been contemplated in the past-have consulted EP   CAD-with history of multiple PCI's to LAD, chronically occluded RCA Continue antiplatelets/beta-blocker/statin  CKD stage IIIb Creatinine slightly higher than usual-but not very far from usual baseline. Watch closely-repeat electrolytes periodically.  HLD Continue statin/fenofibrate Per outpatient note-on Repatha injections as well  DM-2 CBG stable-continue Semglee 20 units daily, SSI Follow/adjust  Recent Labs    07/19/22 1625 07/20/22 0620  GLUCAP 104* 159*    OSA-CPAP nightly  Obesity: Estimated body mass index is 36.74 kg/m as calculated from the following:   Height as of this encounter: 5\' 7"  (1.702 m).   Weight as of this encounter: 106.4 kg.   Code status:   Code Status: Full Code   DVT Prophylaxis: enoxaparin (LOVENOX) injection 40 mg Start: 07/19/22 2200   Family Communication: None at bedside   Disposition Plan: Status is: Observation The patient will require care spanning > 2 midnights and should be moved to inpatient because: Acute CVA-awaiting echo-EP eval   Planned Discharge Destination:Home   Diet: Diet Order             Diet Carb Modified Fluid consistency: Thin; Room service appropriate? Yes  Diet effective now  Antimicrobial agents: Anti-infectives (From admission, onward)    None        MEDICATIONS: Scheduled Meds:  aspirin  81 mg Oral Daily   atorvastatin  80 mg Oral QPM   buPROPion  150 mg Oral Daily   carvedilol  25 mg Oral BID WC   clopidogrel  75 mg Oral Daily   dapagliflozin propanediol  10 mg Oral QAC breakfast   digoxin  125 mcg Oral  Daily   enoxaparin (LOVENOX) injection  40 mg Subcutaneous Q24H   fenofibrate  160 mg Oral Daily   furosemide  40 mg Oral Daily   icosapent Ethyl  2 g Oral BID   insulin aspart  0-15 Units Subcutaneous TID WC   insulin glargine-yfgn  20 Units Subcutaneous QHS   isosorbide-hydrALAZINE  1 tablet Oral TID   potassium chloride SA  40 mEq Oral Daily   sacubitril-valsartan  1 tablet Oral BID   spironolactone  25 mg Oral Daily   Continuous Infusions:  sodium chloride 50 mL/hr at 07/19/22 2330   PRN Meds:.acetaminophen, ketorolac, senna-docusate   I have personally reviewed following labs and imaging studies  LABORATORY DATA: CBC: Recent Labs  Lab 07/19/22 1630 07/19/22 1645  WBC 11.7*  --   NEUTROABS 6.5  --   HGB 18.1* 18.0*  HCT 51.3 53.0*  MCV 85.6  --   PLT 239  --     Basic Metabolic Panel: Recent Labs  Lab 07/19/22 1630 07/19/22 1645  NA 139 141  K 3.6 4.1  CL 102 102  CO2 25  --   GLUCOSE 104* 105*  BUN 17 22*  CREATININE 1.62* 1.60*  CALCIUM 10.1  --     GFR: Estimated Creatinine Clearance: 65.6 mL/min (A) (by C-G formula based on SCr of 1.6 mg/dL (H)).  Liver Function Tests: Recent Labs  Lab 07/19/22 1630  AST 24  ALT 24  ALKPHOS 66  BILITOT 0.7  PROT 8.4*  ALBUMIN 4.4   No results for input(s): "LIPASE", "AMYLASE" in the last 168 hours. No results for input(s): "AMMONIA" in the last 168 hours.  Coagulation Profile: Recent Labs  Lab 07/19/22 1630  INR 1.0    Cardiac Enzymes: No results for input(s): "CKTOTAL", "CKMB", "CKMBINDEX", "TROPONINI" in the last 168 hours.  BNP (last 3 results) No results for input(s): "PROBNP" in the last 8760 hours.  Lipid Profile: Recent Labs    07/20/22 0337  CHOL 169  HDL 44  LDLCALC 105*  TRIG 100  CHOLHDL 3.8    Thyroid Function Tests: No results for input(s): "TSH", "T4TOTAL", "FREET4", "T3FREE", "THYROIDAB" in the last 72 hours.  Anemia Panel: No results for input(s): "VITAMINB12",  "FOLATE", "FERRITIN", "TIBC", "IRON", "RETICCTPCT" in the last 72 hours.  Urine analysis:    Component Value Date/Time   COLORURINE YELLOW 07/20/2022 0326   APPEARANCEUR CLEAR 07/20/2022 0326   LABSPEC 1.033 (H) 07/20/2022 0326   PHURINE 5.0 07/20/2022 0326   GLUCOSEU 150 (A) 07/20/2022 0326   HGBUR NEGATIVE 07/20/2022 0326   BILIRUBINUR NEGATIVE 07/20/2022 0326   KETONESUR NEGATIVE 07/20/2022 0326   PROTEINUR 100 (A) 07/20/2022 0326   UROBILINOGEN 0.2 02/26/2020 1145   NITRITE NEGATIVE 07/20/2022 0326   LEUKOCYTESUR NEGATIVE 07/20/2022 0326    Sepsis Labs: Lactic Acid, Venous No results found for: "LATICACIDVEN"  MICROBIOLOGY: No results found for this or any previous visit (from the past 240 hour(s)).  RADIOLOGY STUDIES/RESULTS: MR BRAIN WO CONTRAST  Result Date: 07/19/2022 CLINICAL DATA:  Initial evaluation for  neuro deficit, stroke. EXAM: MRI HEAD WITHOUT CONTRAST TECHNIQUE: Multiplanar, multiecho pulse sequences of the brain and surrounding structures were obtained without intravenous contrast. COMPARISON:  Prior CTs from earlier the same day. FINDINGS: Brain: Examination technically limited as the patient was unable to tolerate the full length of the exam. Additionally, provided images are degraded by motion. Cerebral volume within normal limits. No significant cerebral white matter disease for age. Confluent restricted diffusion involving the mesial left temporal occipital region, consistent with an evolving left PCA distribution infarct. Finding is in keeping with the previously identified left P2 occlusion. No significant regional mass effect. No definite associated hemorrhage, although evaluation limited as and SWI sequence is not available for review. No other evidence for acute or subacute ischemia. No mass lesion, midline shift or mass effect. No hydrocephalus or extra-axial fluid collection. Vascular: Major intracranial vascular flow voids are grossly maintained. Skull and  upper cervical spine: Craniocervical junction grossly within normal limits. Bone marrow signal intensity grossly normal. No scalp soft tissue abnormality. Sinuses/Orbits: Globes orbital soft tissues demonstrate no acute finding. Paranasal sinuses are largely clear. No significant mastoid effusion. Other: None. IMPRESSION: 1. Technically limited exam due to the patient's inability to tolerate the full length of the study and motion artifact. 2. Moderate-sized acute ischemic left PCA distribution infarct, in keeping with the previously identified left P2 occlusion. No significant regional mass effect. 3. No other acute intracranial abnormality. Electronically Signed   By: Rise Mu M.D.   On: 07/19/2022 21:45   DG Chest 2 View  Result Date: 07/19/2022 CLINICAL DATA:  [id number] CVA (cerebral vascular accident) [facility]) [id number] EXAM: CHEST - 2 VIEW COMPARISON:  07/03/2020 FINDINGS: The lungs are symmetrically well expanded. Reticular opacities at the right lung base are nonspecific, possibly infectious or inflammatory in the acute setting. No pneumothorax or pleural effusion. Cardiac size within normal limits. Pulmonary vascularity is normal. No acute bone abnormality. IMPRESSION: 1. Reticular opacities at the right lung base, possibly infectious or inflammatory in the acute setting. Electronically Signed   By: Helyn Numbers M.D.   On: 07/19/2022 20:25   CT ANGIO HEAD NECK W WO CM (CODE STROKE)  Result Date: 07/19/2022 CLINICAL DATA:  Stroke suspected EXAM: CT ANGIOGRAPHY HEAD AND NECK TECHNIQUE: Multidetector CT imaging of the head and neck was performed using the standard protocol during bolus administration of intravenous contrast. Multiplanar CT image reconstructions and MIPs were obtained to evaluate the vascular anatomy. Carotid stenosis measurements (when applicable) are obtained utilizing NASCET criteria, using the distal internal carotid diameter as the denominator. RADIATION DOSE REDUCTION: This  exam was performed according to the departmental dose-optimization program which includes automated exposure control, adjustment of the mA and/or kV according to patient size and/or use of iterative reconstruction technique. CONTRAST:  41mL OMNIPAQUE IOHEXOL 350 MG/ML SOLN COMPARISON:  No prior CTA available; correlation is made with same day CT head FINDINGS: CT HEAD FINDINGS For noncontrast findings, please see same day CT head. CTA NECK FINDINGS Aortic arch: Standard branching. Imaged portion shows no evidence of aneurysm or dissection. No significant stenosis of the major arch vessel origins. Right carotid system: No evidence of stenosis, dissection, or occlusion. Left carotid system: No evidence of stenosis, dissection, or occlusion. Vertebral arteries: No evidence of stenosis, dissection, or occlusion. Skeleton: No acute osseous abnormality. Other neck: Negative. Upper chest: Negative. Review of the MIP images confirms the above findings CTA HEAD FINDINGS Anterior circulation: Both internal carotid arteries are patent to the termini, without significant stenosis. A1  segments patent. Normal anterior communicating artery. Anterior cerebral arteries are patent to their distal aspects. No M1 stenosis or occlusion. Moderate stenosis in a right M4 branch (series 6, images 252-254) and diminished opacification of downstream branch vessels. Distal MCA branches are otherwise perfused without significant stenosis. Posterior circulation: Vertebral arteries patent to the vertebrobasilar junction without stenosis. Posterior inferior cerebellar arteries patent proximally. Basilar patent to its distal aspect. Superior cerebellar arteries patent proximally. Patent P1 segments. Occlusion of the distal left P2 (series 6, images 228-230). The right PCA is patent to its distal aspects. Venous sinuses: Not well opacified. Anatomic variants: None significant. Review of the MIP images confirms the above findings IMPRESSION: 1.  Occlusion of the distal left P2. 2. Moderate stenosis in a right M4 branch with diminished opacification of downstream branch vessels. 3. No hemodynamically significant stenosis in the neck. Code stroke imaging results were communicated on 07/19/2022 at 5:14 pm to provider STACK via telephone, who verbally acknowledged these results. Electronically Signed   By: Wiliam KeAlison  Vasan M.D.   On: 07/19/2022 17:15   CT HEAD CODE STROKE WO CONTRAST  Result Date: 07/19/2022 CLINICAL DATA:  Code stroke. EXAM: CT HEAD WITHOUT CONTRAST TECHNIQUE: Contiguous axial images were obtained from the base of the skull through the vertex without intravenous contrast. RADIATION DOSE REDUCTION: This exam was performed according to the departmental dose-optimization program which includes automated exposure control, adjustment of the mA and/or kV according to patient size and/or use of iterative reconstruction technique. COMPARISON:  None Available. FINDINGS: Brain: No evidence of acute infarction, hemorrhage, cerebral edema, mass, mass effect, or midline shift. No hydrocephalus or extra-axial collection. Vascular: No hyperdense vessel. Skull: Negative for fracture or focal lesion. Sinuses/Orbits: Mild mucosal thickening in the left maxillary sinus. Otherwise clear paranasal sinuses. No acute finding in the orbits. Other: The mastoid air cells are well aerated. ASPECTS [facility]([place] Stroke Program Early CT Score) - Ganglionic level infarction (caudate, lentiform nuclei, internal capsule, insula, M1-M3 cortex): 7 - Supraganglionic infarction (M4-M6 cortex): 3 Total score (0-10 with 10 being normal): 10 IMPRESSION: No acute intracranial process.  ASPECTS is 10. Code stroke imaging results were communicated on 07/19/2022 at 4:48 pm to provider STACK via secure text paging. Electronically Signed   By: Wiliam KeAlison  Vasan M.D.   On: 07/19/2022 16:49     LOS: 0 days   Jeoffrey MassedShanker Anber Mckiver, MD  [facility]    To contact the attending provider  between 7A-7P or the covering provider during after hours 7P-7A, please log into the web site [email or web address] and access using universal [place] password for that web site. If you do not have the password, please call the hospital operator.  07/20/2022, 11:33 AM

## 2022-07-20 NOTE — TOC Transition Note (Signed)
Transition of Care [facility]) - CM/SW Discharge Note   Patient Details  Name: AWS SHERE MRN: [id number] Date of Birth: Jun 27, 1976  Transition of Care [facility]) CM/SW Contact:  Kermit Balo, RN Phone Number: 07/20/2022, 4:24 PM   Clinical Narrative:    Pt is discharging home with outpatient therapy through [facility]. Information on the AVS.  PCP appt also on AVS.  Pt has transportation home via his SO.    Final next level of care: OP Rehab Barriers to Discharge: No Barriers Identified   Patient Goals and CMS Choice     Choice offered to / list presented to : Patient  Discharge Placement                       Discharge Plan and Services   Discharge Planning Services: CM Consult                                 Social Determinants of Health (SDOH) Interventions     Readmission Risk Interventions     No data to display

## 2022-07-20 NOTE — Discharge Summary (Addendum)
PATIENT DETAILS Name: Peter Russo Age: 46 y.o. Sex: male Date of Birth: Dec 29, 1975 MRN: [id number]. Admitting Physician: Neena Rhymes, MD [id number], No Pcp Per  Admit Date: 07/19/2022 Discharge date: 07/20/2022  Recommendations for Outpatient Follow-up:  Follow up with PCP in 1-2 weeks Please obtain CMP/CBC in one week Aspirin/Plavix x 3 weeks, followed by Plavix alone Please ensure follow-up with the stroke clinic Please ensure follow-up with EP service for possible ICD placement  Admitted From:  Home  Disposition: Outpatient PT   Discharge Condition: fair  CODE STATUS:   Code Status: Full Code   Diet recommendation:  Diet Order             Diet Carb Modified Fluid consistency: Thin; Room service appropriate? Yes  Diet effective now           Diet - low sodium heart healthy           Diet Carb Modified                    Brief Summary: Patient is a 46 y.o.  male with history of chronic HFrEF, CAD-s/p multiple PCI's, HTN, DM-2-who presented with headache, blurred vision-upon further evaluation-was found to have acute CVA.   Significant events: 12/5>> admit to [facility]-acute CVA   Significant studies: 09/6>> echo: EF<20 percent 12/5>> MRI brain: Moderate acute left PCA infarct. 12/5>> CTA head/neck: Occlusion of distal left P2. 12/5>> UDS: Positive for tetrahydrocannabinol 12/6>> LDL: 105 12/6>> A1c: Pending   Significant microbiology data: None   Procedures: None   Consults: Neurology EP    Brief Hospital Course:  Acute ischemic infarct involving left PCA territory Suspicion for embolic event given ischemic cardiomyopathy Continues to have right upper quadrantanopsia-but no other deficits. No A-fib on telemetry monitoring overnight Recommendations from neurology are to continue aspirin/Plavix x 3 weeks followed by Plavix alone Since had a recent echo just a couple of months ago-no need to repeat echo Discussed with neurology/EP  (Renee Ursuy)-since ICD is being contemplated in the near future, no point in placing a loop recorder.  Discussed with the EP that if ICD placement is delayed, then patient will need a outpatient heart monitor. EP will arrange for outpatient follow-up Please ensure patient follows with stroke clinic as well.  Note:patient aware of driving restrictions    Ischemic cardiomyopathy Chronic HFrEF Euvolemic Continue with Entresto/Aldactone/Lasix/Imdur/Coreg/digoxin. No longer Bidil per patient See above regarding ICD   CAD-with history of multiple PCI's to LAD, chronically occluded RCA Continue antiplatelets/beta-blocker/statin   CKD stage IIIb Creatinine slightly higher than usual-but not very far from usual baseline. Watch closely-repeat electrolytes periodically.   HLD Continue statin/fenofibrate Per outpatient note-on Repatha injections as well   DM-2 CBG stable-continue Semglee 20 units daily, SSI Follow/adjust  OSA CPAP nightly   Obesity: Estimated body mass index is 36.74 kg/m as calculated from the following:   Height as of this encounter: _0  (1.702 m).   Weight as of this encounter: 106.4 kg.       Discharge Diagnoses:  Principal Problem:   CVA (cerebral vascular accident) ([place]) Active Problems:   Dyslipidemia   GERD (gastroesophageal reflux disease)   GAD (generalized anxiety disorder)   Essential hypertension   Ischemic cardiomyopathy   AKI (acute kidney injury) ([place])   Type 2 diabetes mellitus with microalbuminuria, with long-term current use of insulin [facility])   Discharge Instructions:  Activity:  As tolerated  not to drive/operate heavy machinery given persistent right quadrantanopsia  Discharge Instructions     (HEART FAILURE PATIENTS) Call MD:  Anytime you have any of the following symptoms: 1) 3 pound weight gain in 24 hours or 5 pounds in 1 week 2) shortness of breath, with or without a dry hacking cough 3) swelling in the hands, feet or  stomach 4) if you have to sleep on extra pillows at night in order to breathe.   Complete by: As directed    Ambulatory referral to Occupational Therapy   Complete by: As directed    Ambulatory referral to Occupational Therapy   Complete by: As directed    Ambulatory referral to Physical Therapy   Complete by: As directed    Ambulatory referral to Physical Therapy   Complete by: As directed    Diet - low sodium heart healthy   Complete by: As directed    Diet Carb Modified   Complete by: As directed    Discharge instructions   Complete by: As directed    Follow with Primary MD   in 1-2 weeks  Follow-up with cardiology-they will call you with a follow-up appointment  Follow-up with the CHF clinic as previous  Follow-up with stroke clinic/neurology-they will call you with a follow-up appointment.  No driving or operating heavy machinery given your right visual deficits.  Please get a complete blood count and chemistry panel checked by your Primary MD at your next visit, and again as instructed by your Primary MD.  Get Medicines reviewed and adjusted: Please take all your medications with you for your next visit with your Primary MD  Laboratory/radiological data: Please request your Primary MD to go over all hospital tests and procedure/radiological results at the follow up, please ask your Primary MD to get all Hospital records sent to his/her office.  In some cases, they will be blood work, cultures and biopsy results pending at the time of your discharge. Please request that your primary care M.D. follows up on these results.  Also Note the following: If you experience worsening of your admission symptoms, develop shortness of breath, life threatening emergency, suicidal or homicidal thoughts you must seek medical attention immediately by calling 911 or calling your MD immediately  if symptoms less severe.  You must read complete instructions/literature along with all the  possible adverse reactions/side effects for all the Medicines you take and that have been prescribed to you. Take any new Medicines after you have completely understood and accpet all the possible adverse reactions/side effects.   Do not drive when taking Pain medications or sleeping medications (Benzodaizepines)  Do not take more than prescribed Pain, Sleep and Anxiety Medications. It is not advisable to combine anxiety,sleep and pain medications without talking with your primary care practitioner  Special Instructions: If you have smoked or chewed Tobacco  in the last 2 yrs please stop smoking, stop any regular Alcohol  and or any Recreational drug use.  Wear Seat belts while driving.  Please note: You were cared for by a hospitalist during your hospital stay. Once you are discharged, your primary care physician will handle any further medical issues. Please note that NO REFILLS for any discharge medications will be authorized once you are discharged, as it is imperative that you return to your primary care physician (or establish a relationship with a primary care physician if you do not have one) for your post hospital discharge needs so that they can reassess your need for medications and monitor your lab values.   No driving  or operating heavy machinery given your right visual deficits.   Increase activity slowly   Complete by: As directed       Allergies as of 07/20/2022   No Known Allergies      Medication List     STOP taking these medications    isosorbide-hydrALAZINE 20-37.5 MG tablet Commonly known as: BIDIL       TAKE these medications    acetaminophen 325 MG tablet Commonly known as: TYLENOL Take 2 tablets (650 mg total) by mouth every 4 (four) hours as needed for headache or mild pain.   aspirin EC 81 MG tablet Take 1 tablet (81 mg total) by mouth daily.   atorvastatin 80 MG tablet Commonly known as: LIPITOR TAKE 1 TABLET BY MOUTH EVERY DAY AT 6 PM What  changed:  how much to take how to take this when to take this additional instructions   blood glucose meter kit and supplies Dispense based on patient and insurance preference. Use up to four times daily as directed. (FOR ICD-10 E10.9, E11.9).   buPROPion 150 MG 24 hr tablet Commonly known as: WELLBUTRIN XL TAKE 1 TABLET BY MOUTH EVERY DAY   carvedilol 25 MG tablet Commonly known as: COREG Take 1 tablet (25 mg total) by mouth 2 (two) times daily.   clopidogrel 75 MG tablet Commonly known as: PLAVIX Take 1 tablet (75 mg total) by mouth daily. Start taking on: July 21, 2022   clotrimazole-betamethasone cream Commonly known as: LOTRISONE Apply 1 application  topically daily as needed (rash).   dapagliflozin propanediol 10 MG Tabs tablet Commonly known as: Farxiga Take 1 tablet (10 mg total) by mouth daily before breakfast.   digoxin 0.125 MG tablet Commonly known as: LANOXIN Take 1 tablet (125 mcg total) by mouth daily.   Entresto 97-103 MG Generic drug: sacubitril-valsartan Take 1 tablet by mouth 2 (two) times daily.   fenofibrate 160 MG tablet Take 1 tablet (160 mg total) by mouth daily.   furosemide 40 MG tablet Commonly known as: LASIX Take 1 tablet (40 mg total) by mouth daily.   icosapent Ethyl 1 g capsule Commonly known as: Vascepa Take 2 capsules (2 g total) by mouth 2 (two) times daily.   Lantus SoloStar 100 UNIT/ML Solostar Pen Generic drug: insulin glargine Inject 20 Units into the skin daily.   metFORMIN 500 MG 24 hr tablet Commonly known as: GLUCOPHAGE-XR Take 1 tablet (500 mg total) by mouth 2 (two) times daily.   nitroGLYCERIN 0.4 MG SL tablet Commonly known as: NITROSTAT Place 1 tablet (0.4 mg total) under the tongue every 5 (five) minutes as needed for chest pain.   Ozempic (2 MG/DOSE) 8 MG/3ML Sopn Generic drug: Semaglutide (2 MG/DOSE) Inject 2 mg into the skin once a week.   Pen Needles 3/16" 31G X 5 MM Misc 1 Device by Other route  daily in the afternoon. Lantus 23   potassium chloride SA 20 MEQ tablet Commonly known as: KLOR-CON M Take 2 tablets (40 mEq total) by mouth daily.   Repatha SureClick 453 MG/ML Soaj Generic drug: Evolocumab INJECT 1 PEN INTO THE SKIN EVERY 14 (FOURTEEN) DAYS. What changed: how much to take   spironolactone 25 MG tablet Commonly known as: ALDACTONE Take 1 tablet (25 mg total) by mouth daily.   triamcinolone cream 0.1 % Commonly known as: KENALOG Apply 1 application  topically daily as needed (itching).        [facility] [place]  [facility] up on 09/08/2022.   Specialty: Family Medicine Why: Your appt is at 11:00 am. Please arrive early and bring a picture ID, insurance card and current medications Contact information: [place] [phone]        [facility] Follow up.   Specialty: Rehabilitation Why: The outpatient therapy will contact you for the first appointment Contact information: [place] [phone] [address] [place] [phone]        Garvin Fila, MD Follow up.   Specialties: Neurology, Radiology Why: Office will call with date/time, If you dont hear from them,please give them a call, Hospital follow up Contact information: [address] [place] [address] [phone]         Deboraha Sprang, MD Follow up.   Specialty: Cardiology Why: Office will call with date/time, If you dont hear from them,please give them a call Contact information: [address] [address] [phone]                No Known Allergies   Other Procedures/Studies: MR BRAIN WO CONTRAST  Result Date: 07/19/2022 CLINICAL DATA:  Initial evaluation for neuro deficit, stroke. EXAM: MRI HEAD WITHOUT CONTRAST TECHNIQUE: Multiplanar, multiecho pulse sequences of the  brain and surrounding structures were obtained without intravenous contrast. COMPARISON:  Prior CTs from earlier the same day. FINDINGS: Brain: Examination technically limited as the patient was unable to tolerate the full length of the exam. Additionally, provided images are degraded by motion. Cerebral volume within normal limits. No significant cerebral white matter disease for age. Confluent restricted diffusion involving the mesial left temporal occipital region, consistent with an evolving left PCA distribution infarct. Finding is in keeping with the previously identified left P2 occlusion. No significant regional mass effect. No definite associated hemorrhage, although evaluation limited as and SWI sequence is not available for review. No other evidence for acute or subacute ischemia. No mass lesion, midline shift or mass effect. No hydrocephalus or extra-axial fluid collection. Vascular: Major intracranial vascular flow voids are grossly maintained. Skull and upper cervical spine: Craniocervical junction grossly within normal limits. Bone marrow signal intensity grossly normal. No scalp soft tissue abnormality. Sinuses/Orbits: Globes orbital soft tissues demonstrate no acute finding. Paranasal sinuses are largely clear. No significant mastoid effusion. Other: None. IMPRESSION: 1. Technically limited exam due to the patient's inability to tolerate the full length of the study and motion artifact. 2. Moderate-sized acute ischemic left PCA distribution infarct, in keeping with the previously identified left P2 occlusion. No significant regional mass effect. 3. No other acute intracranial abnormality. Electronically Signed   By: Jeannine Boga M.D.   On: 07/19/2022 21:45   DG Chest 2 View  Result Date: 07/19/2022 CLINICAL DATA:  [id number] CVA (cerebral vascular accident) [facility]) [id number] EXAM: CHEST - 2 VIEW COMPARISON:  07/03/2020 FINDINGS: The lungs are symmetrically well expanded. Reticular opacities at  the right lung base are nonspecific, possibly infectious or inflammatory in the acute setting. No pneumothorax or pleural effusion. Cardiac size within normal limits. Pulmonary vascularity is normal. No acute bone abnormality. IMPRESSION: 1. Reticular opacities at the right lung base, possibly infectious or inflammatory in the acute setting. Electronically Signed   By: Fidela Salisbury M.D.   On: 07/19/2022 20:25   CT ANGIO HEAD NECK W WO CM (CODE STROKE)  Result Date: 07/19/2022 CLINICAL DATA:  Stroke suspected EXAM: CT ANGIOGRAPHY HEAD AND  NECK TECHNIQUE: Multidetector CT imaging of the head and neck was performed using the standard protocol during bolus administration of intravenous contrast. Multiplanar CT image reconstructions and MIPs were obtained to evaluate the vascular anatomy. Carotid stenosis measurements (when applicable) are obtained utilizing NASCET criteria, using the distal internal carotid diameter as the denominator. RADIATION DOSE REDUCTION: This exam was performed according to the departmental dose-optimization program which includes automated exposure control, adjustment of the mA and/or kV according to patient size and/or use of iterative reconstruction technique. CONTRAST:  29m OMNIPAQUE IOHEXOL 350 MG/ML SOLN COMPARISON:  No prior CTA available; correlation is made with same day CT head FINDINGS: CT HEAD FINDINGS For noncontrast findings, please see same day CT head. CTA NECK FINDINGS Aortic arch: Standard branching. Imaged portion shows no evidence of aneurysm or dissection. No significant stenosis of the major arch vessel origins. Right carotid system: No evidence of stenosis, dissection, or occlusion. Left carotid system: No evidence of stenosis, dissection, or occlusion. Vertebral arteries: No evidence of stenosis, dissection, or occlusion. Skeleton: No acute osseous abnormality. Other neck: Negative. Upper chest: Negative. Review of the MIP images confirms the above findings CTA HEAD  FINDINGS Anterior circulation: Both internal carotid arteries are patent to the termini, without significant stenosis. A1 segments patent. Normal anterior communicating artery. Anterior cerebral arteries are patent to their distal aspects. No M1 stenosis or occlusion. Moderate stenosis in a right M4 branch (series 6, images 252-254) and diminished opacification of downstream branch vessels. Distal MCA branches are otherwise perfused without significant stenosis. Posterior circulation: Vertebral arteries patent to the vertebrobasilar junction without stenosis. Posterior inferior cerebellar arteries patent proximally. Basilar patent to its distal aspect. Superior cerebellar arteries patent proximally. Patent P1 segments. Occlusion of the distal left P2 (series 6, images 228-230). The right PCA is patent to its distal aspects. Venous sinuses: Not well opacified. Anatomic variants: None significant. Review of the MIP images confirms the above findings IMPRESSION: 1. Occlusion of the distal left P2. 2. Moderate stenosis in a right M4 branch with diminished opacification of downstream branch vessels. 3. No hemodynamically significant stenosis in the neck. Code stroke imaging results were communicated on 07/19/2022 at 5:14 pm to provider STACK via telephone, who verbally acknowledged these results. Electronically Signed   By: AMerilyn BabaM.D.   On: 07/19/2022 17:15   CT HEAD CODE STROKE WO CONTRAST  Result Date: 07/19/2022 CLINICAL DATA:  Code stroke. EXAM: CT HEAD WITHOUT CONTRAST TECHNIQUE: Contiguous axial images were obtained from the base of the skull through the vertex without intravenous contrast. RADIATION DOSE REDUCTION: This exam was performed according to the departmental dose-optimization program which includes automated exposure control, adjustment of the mA and/or kV according to patient size and/or use of iterative reconstruction technique. COMPARISON:  None Available. FINDINGS: Brain: No evidence of  acute infarction, hemorrhage, cerebral edema, mass, mass effect, or midline shift. No hydrocephalus or extra-axial collection. Vascular: No hyperdense vessel. Skull: Negative for fracture or focal lesion. Sinuses/Orbits: Mild mucosal thickening in the left maxillary sinus. Otherwise clear paranasal sinuses. No acute finding in the orbits. Other: The mastoid air cells are well aerated. ASPECTS ([facility]) - Ganglionic level infarction (caudate, lentiform nuclei, internal capsule, insula, M1-M3 cortex): 7 - Supraganglionic infarction (M4-M6 cortex): 3 Total score (0-10 with 10 being normal): 10 IMPRESSION: No acute intracranial process.  ASPECTS is 10. Code stroke imaging results were communicated on 07/19/2022 at 4:48 pm to provider STACK via secure text paging. Electronically Signed  By: Merilyn Baba M.D.   On: 07/19/2022 16:49     TODAY-DAY OF DISCHARGE:  Subjective:   Peter Russo today has no headache,no chest abdominal pain,no new weakness tingling or numbness, feels much better wants to go home today.   Objective:   Blood pressure 110/68, pulse (!) 58, temperature 97.6 F (36.4 C), temperature source Oral, resp. rate 18, height _0  (1.702 m), weight 106.4 kg, SpO2 97 %.  Intake/Output Summary (Last 24 hours) at 07/20/2022 1635 Last data filed at 07/20/2022 0915 Gross per 24 hour  Intake 1686 ml  Output 900 ml  Net 786 ml   Filed Weights   07/19/22 2343 07/20/22 0328  Weight: 109.6 kg 106.4 kg    Exam: Awake Alert, Oriented *3, No new F.N deficits, Normal affect [place].AT,PERRAL Supple Neck,No JVD, No cervical lymphadenopathy appriciated.  Symmetrical Chest wall movement, Good air movement bilaterally, CTAB RRR,No Gallops,Rubs or new Murmurs, No Parasternal Heave +ve B.Sounds, Abd Soft, Non tender, No organomegaly appriciated, No rebound -guarding or rigidity. No Cyanosis, Clubbing or edema, No new Rash or bruise   PERTINENT RADIOLOGIC STUDIES: MR  BRAIN WO CONTRAST  Result Date: 07/19/2022 CLINICAL DATA:  Initial evaluation for neuro deficit, stroke. EXAM: MRI HEAD WITHOUT CONTRAST TECHNIQUE: Multiplanar, multiecho pulse sequences of the brain and surrounding structures were obtained without intravenous contrast. COMPARISON:  Prior CTs from earlier the same day. FINDINGS: Brain: Examination technically limited as the patient was unable to tolerate the full length of the exam. Additionally, provided images are degraded by motion. Cerebral volume within normal limits. No significant cerebral white matter disease for age. Confluent restricted diffusion involving the mesial left temporal occipital region, consistent with an evolving left PCA distribution infarct. Finding is in keeping with the previously identified left P2 occlusion. No significant regional mass effect. No definite associated hemorrhage, although evaluation limited as and SWI sequence is not available for review. No other evidence for acute or subacute ischemia. No mass lesion, midline shift or mass effect. No hydrocephalus or extra-axial fluid collection. Vascular: Major intracranial vascular flow voids are grossly maintained. Skull and upper cervical spine: Craniocervical junction grossly within normal limits. Bone marrow signal intensity grossly normal. No scalp soft tissue abnormality. Sinuses/Orbits: Globes orbital soft tissues demonstrate no acute finding. Paranasal sinuses are largely clear. No significant mastoid effusion. Other: None. IMPRESSION: 1. Technically limited exam due to the patient's inability to tolerate the full length of the study and motion artifact. 2. Moderate-sized acute ischemic left PCA distribution infarct, in keeping with the previously identified left P2 occlusion. No significant regional mass effect. 3. No other acute intracranial abnormality. Electronically Signed   By: Jeannine Boga M.D.   On: 07/19/2022 21:45   DG Chest 2 View  Result Date:  07/19/2022 CLINICAL DATA:  [id number] CVA (cerebral vascular accident) [facility]) [id number] EXAM: CHEST - 2 VIEW COMPARISON:  07/03/2020 FINDINGS: The lungs are symmetrically well expanded. Reticular opacities at the right lung base are nonspecific, possibly infectious or inflammatory in the acute setting. No pneumothorax or pleural effusion. Cardiac size within normal limits. Pulmonary vascularity is normal. No acute bone abnormality. IMPRESSION: 1. Reticular opacities at the right lung base, possibly infectious or inflammatory in the acute setting. Electronically Signed   By: Fidela Salisbury M.D.   On: 07/19/2022 20:25   CT ANGIO HEAD NECK W WO CM (CODE STROKE)  Result Date: 07/19/2022 CLINICAL DATA:  Stroke suspected EXAM: CT ANGIOGRAPHY HEAD AND NECK TECHNIQUE: Multidetector CT imaging of the head and  neck was performed using the standard protocol during bolus administration of intravenous contrast. Multiplanar CT image reconstructions and MIPs were obtained to evaluate the vascular anatomy. Carotid stenosis measurements (when applicable) are obtained utilizing NASCET criteria, using the distal internal carotid diameter as the denominator. RADIATION DOSE REDUCTION: This exam was performed according to the departmental dose-optimization program which includes automated exposure control, adjustment of the mA and/or kV according to patient size and/or use of iterative reconstruction technique. CONTRAST:  72m OMNIPAQUE IOHEXOL 350 MG/ML SOLN COMPARISON:  No prior CTA available; correlation is made with same day CT head FINDINGS: CT HEAD FINDINGS For noncontrast findings, please see same day CT head. CTA NECK FINDINGS Aortic arch: Standard branching. Imaged portion shows no evidence of aneurysm or dissection. No significant stenosis of the major arch vessel origins. Right carotid system: No evidence of stenosis, dissection, or occlusion. Left carotid system: No evidence of stenosis, dissection, or occlusion. Vertebral  arteries: No evidence of stenosis, dissection, or occlusion. Skeleton: No acute osseous abnormality. Other neck: Negative. Upper chest: Negative. Review of the MIP images confirms the above findings CTA HEAD FINDINGS Anterior circulation: Both internal carotid arteries are patent to the termini, without significant stenosis. A1 segments patent. Normal anterior communicating artery. Anterior cerebral arteries are patent to their distal aspects. No M1 stenosis or occlusion. Moderate stenosis in a right M4 branch (series 6, images 252-254) and diminished opacification of downstream branch vessels. Distal MCA branches are otherwise perfused without significant stenosis. Posterior circulation: Vertebral arteries patent to the vertebrobasilar junction without stenosis. Posterior inferior cerebellar arteries patent proximally. Basilar patent to its distal aspect. Superior cerebellar arteries patent proximally. Patent P1 segments. Occlusion of the distal left P2 (series 6, images 228-230). The right PCA is patent to its distal aspects. Venous sinuses: Not well opacified. Anatomic variants: None significant. Review of the MIP images confirms the above findings IMPRESSION: 1. Occlusion of the distal left P2. 2. Moderate stenosis in a right M4 branch with diminished opacification of downstream branch vessels. 3. No hemodynamically significant stenosis in the neck. Code stroke imaging results were communicated on 07/19/2022 at 5:14 pm to provider STACK via telephone, who verbally acknowledged these results. Electronically Signed   By: AMerilyn BabaM.D.   On: 07/19/2022 17:15   CT HEAD CODE STROKE WO CONTRAST  Result Date: 07/19/2022 CLINICAL DATA:  Code stroke. EXAM: CT HEAD WITHOUT CONTRAST TECHNIQUE: Contiguous axial images were obtained from the base of the skull through the vertex without intravenous contrast. RADIATION DOSE REDUCTION: This exam was performed according to the departmental dose-optimization program which  includes automated exposure control, adjustment of the mA and/or kV according to patient size and/or use of iterative reconstruction technique. COMPARISON:  None Available. FINDINGS: Brain: No evidence of acute infarction, hemorrhage, cerebral edema, mass, mass effect, or midline shift. No hydrocephalus or extra-axial collection. Vascular: No hyperdense vessel. Skull: Negative for fracture or focal lesion. Sinuses/Orbits: Mild mucosal thickening in the left maxillary sinus. Otherwise clear paranasal sinuses. No acute finding in the orbits. Other: The mastoid air cells are well aerated. ASPECTS ([facility] Early CT Score) - Ganglionic level infarction (caudate, lentiform nuclei, internal capsule, insula, M1-M3 cortex): 7 - Supraganglionic infarction (M4-M6 cortex): 3 Total score (0-10 with 10 being normal): 10 IMPRESSION: No acute intracranial process.  ASPECTS is 10. Code stroke imaging results were communicated on 07/19/2022 at 4:48 pm to provider STACK via secure text paging. Electronically Signed   By: AMerilyn BabaM.D.   On: 07/19/2022  16:49     PERTINENT LAB RESULTS: CBC: Recent Labs    07/19/22 1630 07/19/22 1645  WBC 11.7*  --   HGB 18.1* 18.0*  HCT 51.3 53.0*  PLT 239  --    CMET CMP     Component Value Date/Time   NA 141 07/19/2022 1645   NA 139 08/30/2019 1132   K 4.1 07/19/2022 1645   CL 102 07/19/2022 1645   CO2 25 07/19/2022 1630   GLUCOSE 105 (H) 07/19/2022 1645   BUN 22 (H) 07/19/2022 1645   BUN 15 08/30/2019 1132   CREATININE 1.60 (H) 07/19/2022 1645   CREATININE 1.05 04/05/2016 1144   CALCIUM 10.1 07/19/2022 1630   PROT 8.4 (H) 07/19/2022 1630   PROT 7.1 02/01/2021 1224   ALBUMIN 4.4 07/19/2022 1630   ALBUMIN 4.4 02/01/2021 1224   AST 24 07/19/2022 1630   ALT 24 07/19/2022 1630   ALKPHOS 66 07/19/2022 1630   BILITOT 0.7 07/19/2022 1630   BILITOT 0.2 02/01/2021 1224   GFRNONAA 52 (L) 07/19/2022 1630   GFRNONAA 87 04/05/2016 1144   GFRAA >60  05/11/2020 1246   GFRAA >89 04/05/2016 1144    GFR Estimated Creatinine Clearance: 65.6 mL/min (A) (by C-G formula based on SCr of 1.6 mg/dL (H)). No results for input(s): "LIPASE", "AMYLASE" in the last 72 hours. No results for input(s): "CKTOTAL", "CKMB", "CKMBINDEX", "TROPONINI" in the last 72 hours. Invalid input(s): "POCBNP" No results for input(s): "DDIMER" in the last 72 hours. No results for input(s): "HGBA1C" in the last 72 hours. Recent Labs    07/20/22 0337  CHOL 169  HDL 44  LDLCALC 105*  TRIG 100  CHOLHDL 3.8   No results for input(s): "TSH", "T4TOTAL", "T3FREE", "THYROIDAB" in the last 72 hours.  Invalid input(s): "FREET3" No results for input(s): "VITAMINB12", "FOLATE", "FERRITIN", "TIBC", "IRON", "RETICCTPCT" in the last 72 hours. Coags: Recent Labs    07/19/22 1630  INR 1.0   Microbiology: No results found for this or any previous visit (from the past 240 hour(s)).  FURTHER DISCHARGE INSTRUCTIONS:  Get Medicines reviewed and adjusted: Please take all your medications with you for your next visit with your Primary MD  Laboratory/radiological data: Please request your Primary MD to go over all hospital tests and procedure/radiological results at the follow up, please ask your Primary MD to get all Hospital records sent to his/her office.  In some cases, they will be blood work, cultures and biopsy results pending at the time of your discharge. Please request that your primary care M.D. goes through all the records of your hospital data and follows up on these results.  Also Note the following: If you experience worsening of your admission symptoms, develop shortness of breath, life threatening emergency, suicidal or homicidal thoughts you must seek medical attention immediately by calling 911 or calling your MD immediately  if symptoms less severe.  You must read complete instructions/literature along with all the possible adverse reactions/side effects  for all the Medicines you take and that have been prescribed to you. Take any new Medicines after you have completely understood and accpet all the possible adverse reactions/side effects.   Do not drive when taking Pain medications or sleeping medications (Benzodaizepines)  Do not take more than prescribed Pain, Sleep and Anxiety Medications. It is not advisable to combine anxiety,sleep and pain medications without talking with your primary care practitioner  Special Instructions: If you have smoked or chewed Tobacco  in the last 2 yrs please stop  smoking, stop any regular Alcohol  and or any Recreational drug use.  Wear Seat belts while driving.  Please note: You were cared for by a hospitalist during your hospital stay. Once you are discharged, your primary care physician will handle any further medical issues. Please note that NO REFILLS for any discharge medications will be authorized once you are discharged, as it is imperative that you return to your primary care physician (or establish a relationship with a primary care physician if you do not have one) for your post hospital discharge needs so that they can reassess your need for medications and monitor your lab values.  Total Time spent coordinating discharge including counseling, education and face to face time equals greater than 30 minutes.  SignedOren Binet 07/20/2022 4:35 PM

## 2022-07-20 NOTE — Progress Notes (Signed)
Wheeled pt off unit all belongings by his side

## 2022-07-22 ENCOUNTER — Telehealth: Payer: Self-pay | Admitting: Cardiovascular Disease

## 2022-07-22 NOTE — Telephone Encounter (Signed)
Patient sister called again, if someone can please give him a call.

## 2022-07-22 NOTE — Telephone Encounter (Signed)
  Pt' sister called, she said pt has been having really bad headache he  is going back to the hospital again.they are asking if Dr. Eden Emms can prescribed anything for him. Pt doesn't have pcp

## 2022-07-22 NOTE — Telephone Encounter (Signed)
Called patient back about message. Patient stated he ended up going to ER with headache and blurred vision. Patient stated that he is following up with neuro next week. ER told patient just to take some tylenol. Informed patient that we do not prescribe pain medications, but a message would be sent to Dr. Eden Emms. Patient stated he thinks he is just stressed and having a migrain. Patient also loss a close friend today.  Patient stated he had an appointment for PCP, but it was canceled (patient was late). Will send to Dr. Eden Emms for further advisement.

## 2022-07-26 ENCOUNTER — Ambulatory Visit: Payer: Medicare Other | Attending: Neurology

## 2022-07-26 ENCOUNTER — Telehealth: Payer: Self-pay | Admitting: Neurology

## 2022-07-26 DIAGNOSIS — H53461 Homonymous bilateral field defects, right side: Secondary | ICD-10-CM | POA: Diagnosis present
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
## 2022-07-26 NOTE — Telephone Encounter (Signed)
Danelle Earthly from [facility] called stating that she will not have to see the pt again but is recommending for an order to a Neuro Ophthalmology Eval be put in. Please advise.

## 2022-07-27 NOTE — Telephone Encounter (Signed)
Micki Riley, MD  You35 minutes ago (7:01 AM)    No need at present. Will see in office for f/u and decide

## 2022-07-29 ENCOUNTER — Ambulatory Visit: Payer: Medicare Other

## 2022-08-02 ENCOUNTER — Ambulatory Visit: Payer: Medicare Other

## 2022-08-04 ENCOUNTER — Ambulatory Visit: Payer: Medicare Other | Admitting: Physical Therapy

## 2022-08-04 ENCOUNTER — Ambulatory Visit: Payer: Medicare Other

## 2022-08-09 ENCOUNTER — Ambulatory Visit: Payer: Medicare Other | Attending: Internal Medicine | Admitting: Internal Medicine

## 2022-08-09 DIAGNOSIS — I5022 Chronic systolic (congestive) heart failure: Secondary | ICD-10-CM

## 2022-08-09 DIAGNOSIS — I255 Ischemic cardiomyopathy: Secondary | ICD-10-CM

## 2022-08-11 ENCOUNTER — Ambulatory Visit: Payer: Medicare Other

## 2022-08-11 ENCOUNTER — Encounter: Payer: Medicare Other | Admitting: Occupational Therapy

## 2022-08-19 ENCOUNTER — Ambulatory Visit: Payer: Medicare Other | Admitting: Physical Therapy

## 2022-08-25 ENCOUNTER — Other Ambulatory Visit (HOSPITAL_COMMUNITY): Payer: Self-pay

## 2022-08-26 ENCOUNTER — Ambulatory Visit: Payer: Medicare Other

## 2022-08-30 ENCOUNTER — Ambulatory Visit: Payer: Medicare Other

## 2022-08-30 ENCOUNTER — Encounter: Payer: Medicare Other | Admitting: Occupational Therapy

## 2022-09-01 ENCOUNTER — Encounter: Payer: Medicare Other | Admitting: Occupational Therapy

## 2022-09-06 ENCOUNTER — Encounter: Payer: Medicare Other | Admitting: Occupational Therapy

## 2022-09-06 ENCOUNTER — Ambulatory Visit: Payer: Medicare Other

## 2022-09-08 ENCOUNTER — Ambulatory Visit (INDEPENDENT_AMBULATORY_CARE_PROVIDER_SITE_OTHER): Payer: 59 | Admitting: Nurse Practitioner

## 2022-09-08 ENCOUNTER — Telehealth: Payer: Self-pay | Admitting: Nurse Practitioner

## 2022-09-08 ENCOUNTER — Encounter: Payer: Self-pay | Admitting: Nurse Practitioner

## 2022-09-08 VITALS — BP 118/72 | HR 95 | Ht 67.0 in | Wt 244.0 lb

## 2022-09-08 DIAGNOSIS — K219 Gastro-esophageal reflux disease without esophagitis: Secondary | ICD-10-CM | POA: Diagnosis not present

## 2022-09-08 DIAGNOSIS — I509 Heart failure, unspecified: Secondary | ICD-10-CM

## 2022-09-08 DIAGNOSIS — Z794 Long term (current) use of insulin: Secondary | ICD-10-CM

## 2022-09-08 DIAGNOSIS — I639 Cerebral infarction, unspecified: Secondary | ICD-10-CM

## 2022-09-08 DIAGNOSIS — Z8673 Personal history of transient ischemic attack (TIA), and cerebral infarction without residual deficits: Secondary | ICD-10-CM | POA: Insufficient documentation

## 2022-09-08 DIAGNOSIS — I10 Essential (primary) hypertension: Secondary | ICD-10-CM

## 2022-09-08 DIAGNOSIS — R809 Proteinuria, unspecified: Secondary | ICD-10-CM

## 2022-09-08 DIAGNOSIS — E1129 Type 2 diabetes mellitus with other diabetic kidney complication: Secondary | ICD-10-CM

## 2022-09-08 DIAGNOSIS — Z125 Encounter for screening for malignant neoplasm of prostate: Secondary | ICD-10-CM | POA: Diagnosis not present

## 2022-09-08 DIAGNOSIS — Z1211 Encounter for screening for malignant neoplasm of colon: Secondary | ICD-10-CM

## 2022-09-08 LAB — CBC
HCT: 44.3 % (ref 39.0–52.0)
Hemoglobin: 15.3 g/dL (ref 13.0–17.0)
MCHC: 34.4 g/dL (ref 30.0–36.0)
MCV: 89 fl (ref 78.0–100.0)
Platelets: 162 10*3/uL (ref 150.0–400.0)
RBC: 4.98 Mil/uL (ref 4.22–5.81)
RDW: 14.6 % (ref 11.5–15.5)
WBC: 8.5 10*3/uL (ref 4.0–10.5)

## 2022-09-08 LAB — COMPREHENSIVE METABOLIC PANEL
ALT: 13 U/L (ref 0–53)
AST: 13 U/L (ref 0–37)
Albumin: 4.5 g/dL (ref 3.5–5.2)
Alkaline Phosphatase: 64 U/L (ref 39–117)
BUN: 14 mg/dL (ref 6–23)
CO2: 27 mEq/L (ref 19–32)
Calcium: 9.4 mg/dL (ref 8.4–10.5)
Chloride: 105 mEq/L (ref 96–112)
Creatinine, Ser: 1.2 mg/dL (ref 0.40–1.50)
GFR: 71.52 mL/min (ref >60.00)
Glucose, Bld: 93 mg/dL (ref 70–99)
Potassium: 4.1 mEq/L (ref 3.5–5.1)
Sodium: 142 mEq/L (ref 135–145)
Total Bilirubin: 0.7 mg/dL (ref 0.2–1.2)
Total Protein: 7.4 g/dL (ref 6.0–8.3)

## 2022-09-08 LAB — PSA, MEDICARE: PSA: 0.64 ng/ml (ref 0.10–4.00)

## 2022-09-08 NOTE — Assessment & Plan Note (Signed)
Patient currently maintained off any antacids or PPIs or H2 blockers.  Controlled on diet alone continue

## 2022-09-08 NOTE — Assessment & Plan Note (Signed)
Patient is currently maintained on semaglutide, Lantus, [place].  Last A1c well-controlled.  Patient is followed by Dr. Kelton Pillar for endocrinology.  Continue following with her as recommended

## 2022-09-08 NOTE — Assessment & Plan Note (Signed)
Patient was in the hospital and was diagnosed with acute CVA.  Supposed to follow-up with neurology no appointment in chart reached out to neurologist for staff to follow-up.  Continue following with cardiology as recommended.  Patient has no deficits currently recently had his eyes evaluated.  He has completed PT OT

## 2022-09-08 NOTE — Patient Instructions (Signed)
Nice to see you today I will be in touch with the labs once I have them Follow up with me for your physical in 6 months, sooner if you need me

## 2022-09-08 NOTE — Progress Notes (Signed)
New Patient Office Visit  Subjective    Patient ID: Peter Russo, male    DOB: August 24, 1975  Age: 47 y.o. MRN: [id number]  CC:  Chief Complaint  Patient presents with   Establish Care    HPI Peter Russo presents to establish care  Dr Lonzo Cloud - Endocrine Dr. Shirlee Latch - Cardiology Dr. Armanda Magic - Sleep Medicine Dr. Threasa Beards - Neurology     HTN: states that he checks it once a week and tolerates the medications well  CVA: Patient was hospitalized on 07/19/2022.  He presented with complaints of vision disturbance and headache.  He was found to have an acute CVA.  He was discharged on 07/20/2022.  Patient did go to rehab and graduated. supposed to follow-up with neurology but no appointment in the system that I can see thus far.  Patient did complete outpatient occupational and physical therapy.   CHF: Patient is followed by cardiology currently maintained on carvedilol, Farxiga, digoxin, furosemide, Entresto, spironolactone.  OSA: has had sleep studies and has a Bipap. States that he will wear it and try to keep it on for 4 hours. States that he cannot sleep on his back due to anxiety per patient repo GERD: does not have to have medication or diet modificatoin   DM2: States that he checks his sugars once a week or so. States that he is followed by Dr. Lonzo Cloud.  Last office it was 04/04/2022.  Last A1c was checked on 07/20/2022 that was 5.9%.  Patient is currently on Farxiga, semaglutide, metformin, Lantus.  Tobacco: history of smoking. Has stopped in 2018. Does not qualify for LDCT currently   HLD: Patient is followed by cardiology.  He is currently maintained on atorvastatin, Repatha, fenofibrate, Vascepa.   Tdap : unsure get at local pharmacy  Flu: refused Covid: pfizer x 2 Pna: refused  Colonoscopy: amb refer [place]  PSA: due  Outpatient Encounter Medications as of 09/08/2022  Medication Sig   acetaminophen (TYLENOL) 325 MG tablet Take 2 tablets  (650 mg total) by mouth every 4 (four) hours as needed for headache or mild pain.   aspirin 81 MG EC tablet Take 1 tablet (81 mg total) by mouth daily.   atorvastatin (LIPITOR) 80 MG tablet TAKE 1 TABLET BY MOUTH EVERY DAY AT 6 PM (Patient taking differently: Take 80 mg by mouth daily.)   blood glucose meter kit and supplies Dispense based on patient and insurance preference. Use up to four times daily as directed. (FOR ICD-10 E10.9, E11.9).   buPROPion (WELLBUTRIN XL) 150 MG 24 hr tablet TAKE 1 TABLET BY MOUTH EVERY DAY   carvedilol (COREG) 12.5 MG tablet Take 18.75 mg by mouth 2 (two) times daily.   clopidogrel (PLAVIX) 75 MG tablet Take 1 tablet (75 mg total) by mouth daily.   clotrimazole-betamethasone (LOTRISONE) cream Apply 1 application  topically daily as needed (rash).   dapagliflozin propanediol (FARXIGA) 10 MG TABS tablet Take 1 tablet (10 mg total) by mouth daily before breakfast.   digoxin (LANOXIN) 0.125 MG tablet Take 1 tablet (125 mcg total) by mouth daily.   Evolocumab (REPATHA SURECLICK) 140 MG/ML SOAJ INJECT 1 PEN INTO THE SKIN EVERY 14 (FOURTEEN) DAYS.   fenofibrate 160 MG tablet Take 1 tablet (160 mg total) by mouth daily.   furosemide (LASIX) 40 MG tablet Take 1 tablet (40 mg total) by mouth daily.   icosapent Ethyl (VASCEPA) 1 g capsule Take 2 capsules (2 g total) by mouth 2 (two) times  daily.   insulin glargine (LANTUS SOLOSTAR) 100 UNIT/ML Solostar Pen Inject 20 Units into the skin daily.   Insulin Pen Needle (PEN NEEDLES 3/16") 31G X 5 MM MISC 1 Device by Other route daily in the afternoon. Lantus 23   metFORMIN (GLUCOPHAGE-XR) 500 MG 24 hr tablet Take 1 tablet (500 mg total) by mouth 2 (two) times daily.   nitroGLYCERIN (NITROSTAT) 0.4 MG SL tablet Place 1 tablet (0.4 mg total) under the tongue every 5 (five) minutes as needed for chest pain.   potassium chloride SA (KLOR-CON M) 20 MEQ tablet Take 2 tablets (40 mEq total) by mouth daily.   sacubitril-valsartan (ENTRESTO)  97-103 MG Take 1 tablet by mouth 2 (two) times daily.   Semaglutide, 2 MG/DOSE, (OZEMPIC, 2 MG/DOSE,) 8 MG/3ML SOPN Inject 2 mg into the skin once a week.   spironolactone (ALDACTONE) 25 MG tablet Take 1 tablet (25 mg total) by mouth daily.   triamcinolone cream (KENALOG) 0.1 % Apply 1 application  topically daily as needed (itching).   [DISCONTINUED] carvedilol (COREG) 25 MG tablet Take 1 tablet (25 mg total) by mouth 2 (two) times daily.   No facility-administered encounter medications on file as of 09/08/2022.    Past Medical History:  Diagnosis Date   Anxiety    CAD S/P percutaneous coronary angioplasty    a. s/p BMS-mLAD 2010. b. DES to RCA 2012. c. 04/2015: DES to LCx and PCI to LAD c/b dissection with subsequent overlapping DES to mLAD - r/i for NSTEMI afterwards; d. 08/2015 Ant STEMI in setting of noncompliance->178mLAD ISR (3.0x16 Synergy DES); e. 03/2016 PCI of apical LAD; f. 03/2016 Relook Cath: patent LAD stents, RCA 100p CTO-->Med Rx; g. 10/2016 Ant STEMI: PTCA 100p LAD.   Chronic combined systolic and diastolic CHF (congestive heart failure) ([place])    a. 03/2016 Echo: EF 30-35%, Gr2 DD;  b. 08/2016 Echo: EF 40%.   Depression    Essential hypertension    GAD (generalized anxiety disorder)    GERD (gastroesophageal reflux disease)    Hypercholesteremia    Ischemic cardiomyopathy    a. prior EF 25-35 percent at cath, 35-40 percent by echo; b. 03/2016 Echo: EF 30-35%, diff HK, Gr2 DD; c. 08/2016 Echo: EF 40%, base/mid inferior/inferowseptal AK, mildly dil LA.   Morbid obesity ([place])    Pre-diabetes    SVT (supraventricular tachycardia) APRROX 10 YRS AGO   PRESUMED AVNRT, ECHO 12/07 WITH EF 65%, MILD LAE   Tobacco abuse    a. 20 + pack years, quit 10/2016.    Past Surgical History:  Procedure Laterality Date   CARDIAC CATHETERIZATION N/A 04/23/2015   Procedure: Left Heart Cath and Coronary Angiography;  Surgeon: Josue Hector, MD;  Location: [place] [facility];  Service: Cardiovascular;   Laterality: N/A;   CARDIAC CATHETERIZATION N/A 04/23/2015   Procedure: Coronary Stent Intervention;  Surgeon: Sherren Mocha, MD;  Location: [place] [facility];  Service: Cardiovascular;  Laterality: N/A;   CARDIAC CATHETERIZATION N/A 09/14/2015   Procedure: Left Heart Cath and Coronary Angiography;  Surgeon: Belva Crome, MD;  LAD 100%, CFX 10% ISR, RCA 100% (chronic), EF 25-35%   CARDIAC CATHETERIZATION N/A 09/14/2015   Procedure: Coronary Stent Intervention;  Surgeon: Belva Crome, MD;  Location: [place] [facility];  Service: Cardiovascular;  Laterality: N/A; 3.0 x 16 mm Synergy DES LAD   CARDIAC CATHETERIZATION  03/16/2016   CARDIAC CATHETERIZATION N/A 03/16/2016   Procedure: Left Heart Cath and Coronary Angiography;  Surgeon: Mallie Mussel  Malissa Hippo, MD;  Location: [facility];  Service: Cardiovascular;  Laterality: N/A;   CARDIAC CATHETERIZATION N/A 03/16/2016   Procedure: Coronary Balloon Angioplasty;  Surgeon: Lyn Records, MD;  [facility]: [facility] IN[facility];  Service: Cardiovascular;  Laterality: N/A;   CARDIAC CATHETERIZATION N/A 03/25/2016   Procedure: Left Heart Cath and Coronary Angiography;  Surgeon: Peter M Swaziland, MD;  Location: [facility] INVASIVE [facility];  Service: Cardiovascular;  Laterality: N/A;   CORONARY ANGIOPLASTY WITH STENT PLACEMENT  2010; 2013   CORONARY BALLOON ANGIOPLASTY N/A 10/19/2016   Procedure: Coronary Balloon Angioplasty;  Surgeon: Corky Crafts, MD;  Location: [facility] IN[facility]ASIVE [facility];  Service: Cardiovascular;  Laterality: N/A;   INTRAVASCULAR ULTRASOUND/IVUS N/A 10/19/2016   Procedure: Intravascular Ultrasound/IVUS;  Surgeon: Corky Crafts, MD;  Location: [facility];  Service: Cardiovascular;  Laterality: N/A;   LEFT HEART CATH AND CORONARY ANGIOGRAPHY N/A 10/19/2016   Procedure: Left Heart Cath and Coronary Angiography;  Surgeon: Corky Crafts, MD;  Location: [facility] INVASIVE [facility];  Service: Cardiovascular;  Laterality: N/A;   RIGHT/LEFT HEART CATH AND CORONARY  ANGIOGRAPHY N/A 05/21/2020   Procedure: RIGHT/LEFT HEART CATH AND CORONARY ANGIOGRAPHY;  Surgeon: Laurey Morale, MD;  Location: [facility] INVASIVE CV LAB;  Service: Cardiovascular;  Laterality: N/A;    Family History  Problem Relation Age of Onset   Arrhythmia Mother        MOTHER LIVED TO BE 23   Coronary artery disease Mother    Heart attack Mother    Hypertension Mother    Coronary artery disease Father 68   Heart disease Father    Heart attack Father    Heart attack Brother    Stroke Neg Hx     Social History   Socioeconomic History   Marital status: Significant Other    Spouse name: Clementeen Hoof   Number of children: 2   Years of education: Not on file   Highest education level: Not on file  Occupational History   Occupation: Agricultural engineer: [employer]  Tobacco Use   Smoking status: Former    Packs/day: 1.00    Years: 24.00    Total pack years: 24.00    Types: Cigarettes    Quit date: 11/03/2016    Years since quitting: 5.8   Smokeless tobacco: Never   Tobacco comments:    quit on 03/15/16  Vaping Use   Vaping Use: Never used  Substance and Sexual Activity   Alcohol use: No   Drug use: Not Currently    Types: Marijuana    Comment: last time 02/2016   Sexual activity: Yes  Other Topics Concern   Not on file  Social History Narrative   Diability      Sakiya (20)   Khmari (17)      Step daughter: Myna Hidalgo (30)      Hobbies: fish, drag race   Social Determinants of Health   Financial Resource Strain: Not on file  Food Insecurity: No Food Insecurity (07/20/2022)   Hunger Vital Sign    Worried About Running Out of Food in the Last Year: Never true    Ran Out of Food in the Last Year: Never true  Transportation Needs: No Transportation Needs (07/20/2022)   PRAPARE - Administrator, Civil Service (Medical): No    Lack of Transportation (Non-Medical): No  Physical Activity: Not on file  Stress: Not on file  Social Connections: Not on file  Intimate Partner Violence: Not At Risk (07/20/2022)   Humiliation, Afraid, Rape, and Kick questionnaire    Fear of Current or Ex-Partner: No    Emotionally Abused: No    Physically Abused: No    Sexually Abused: No    Review of Systems  Constitutional:  Negative for chills and fever.  Respiratory:  Negative for shortness of breath.   Cardiovascular:  Negative for chest pain (just with intercourse).  Neurological:  Negative for headaches.  Psychiatric/Behavioral:  Negative for hallucinations and suicidal ideas.         Objective    BP 118/72   Pulse 95   Ht 5\' 7"  (1.702 m)   Wt 244 lb (110.7 kg)   SpO2 99%   BMI 38.22 kg/m   Physical Exam Vitals and nursing note reviewed.  Constitutional:      Appearance: Normal appearance.  Cardiovascular:     Rate and Rhythm: Normal rate and regular rhythm.     Pulses: Normal pulses.     Heart sounds: Normal heart sounds.  Pulmonary:     Effort: Pulmonary effort is normal.     Breath sounds: Normal breath sounds.  Musculoskeletal:     Right lower leg: No edema.     Left lower leg: No edema.  Skin:    General: Skin is warm.  Neurological:     General: No focal deficit present.     Mental Status: He is alert.     Deep Tendon Reflexes:     Reflex Scores:      Bicep reflexes are 2+ on the right side and 2+ on the left side.      Patellar reflexes are 2+ on the right side and 2+ on the left side.    Comments: Bilateral upper and lower extremity strength 5/5         Assessment & Plan:   Problem List Items Addressed This Visit       Cardiovascular and Mediastinum   Essential hypertension    Patient currently followed by cardiology and on several medications.  Patient is tolerating medications well continue taking medication as prescribed      Relevant Medications   carvedilol (COREG) 12.5 MG tablet   Other Relevant Orders   CBC   Comprehensive metabolic panel   CHF (congestive heart failure) (HCC)   Relevant  Medications   carvedilol (COREG) 12.5 MG tablet   CVA (cerebral vascular accident) [facility])    Patient was in the hospital and was diagnosed with acute CVA.  Supposed to follow-up with neurology no appointment in chart reached out to neurologist for staff to follow-up.  Continue following with cardiology as recommended.  Patient has no deficits currently recently had his eyes evaluated.  He has completed PT OT      Relevant Medications   carvedilol (COREG) 12.5 MG tablet     Digestive   GERD (gastroesophageal reflux disease)    Patient currently maintained off any antacids or PPIs or H2 blockers.  Controlled on diet alone continue        Endocrine   Type 2 diabetes mellitus with microalbuminuria, with long-term current use of insulin ([place])    Patient is currently maintained on semaglutide, Lantus, Farxiga.  Last A1c well-controlled.  Patient is followed by Dr. Kelton Pillar for endocrinology.  Continue following with her as recommended        Other   History of stroke - Primary    Recent hospital admission for acute CVA.  Patient  completed PT and OT.  He has gotten his eyes evaluated.  He is on high intensity statin along with clopidogrel.      Relevant Orders   CBC   Comprehensive metabolic panel   Other Visit Diagnoses     Screening for prostate cancer       Relevant Orders   PSA, Medicare   Screening for colon cancer       Relevant Orders   Ambulatory referral to Gastroenterology       Return in about 6 months (around 03/09/2023) for CPE and Labs.   Audria Nine, NP

## 2022-09-08 NOTE — Telephone Encounter (Signed)
Dr. Leonie Man,  I saw Peter Russo in office. He had an acute CVA on 07/19/2022 and discharged from the hospital on 07/20/2022. I see that he was suppose to follow up with you and your clinic. Wanted to make you aware that way your staff could get him scheduled with you in office  Thanks, Matt C

## 2022-09-08 NOTE — Assessment & Plan Note (Signed)
Patient currently followed by cardiology and on several medications.  Patient is tolerating medications well continue taking medication as prescribed

## 2022-09-08 NOTE — Assessment & Plan Note (Signed)
Recent hospital admission for acute CVA.  Patient completed PT and OT.  He has gotten his eyes evaluated.  He is on high intensity statin along with clopidogrel.

## 2022-09-09 ENCOUNTER — Ambulatory Visit: Payer: Medicare Other

## 2022-09-13 ENCOUNTER — Ambulatory Visit: Payer: Medicare Other

## 2022-09-15 ENCOUNTER — Encounter: Payer: Medicare Other | Admitting: Occupational Therapy

## 2022-09-15 ENCOUNTER — Ambulatory Visit: Payer: Medicare Other

## 2022-09-20 ENCOUNTER — Ambulatory Visit: Payer: Medicare Other

## 2022-09-23 ENCOUNTER — Ambulatory Visit: Payer: Medicare Other

## 2022-09-23 ENCOUNTER — Encounter: Payer: Medicare Other | Admitting: Occupational Therapy

## 2022-09-26 ENCOUNTER — Ambulatory Visit: Payer: Medicare Other

## 2022-10-06 ENCOUNTER — Other Ambulatory Visit: Payer: Self-pay | Admitting: Internal Medicine

## 2022-10-06 ENCOUNTER — Other Ambulatory Visit (HOSPITAL_COMMUNITY): Payer: Self-pay | Admitting: Family Medicine

## 2022-10-06 DIAGNOSIS — E785 Hyperlipidemia, unspecified: Secondary | ICD-10-CM

## 2022-10-07 ENCOUNTER — Ambulatory Visit: Payer: Medicare Other | Admitting: Internal Medicine

## 2022-10-07 NOTE — Progress Notes (Deleted)
Name: Peter Russo  Age/ Sex: 47 y.o., male   MRN/ DOB: [id number], 02/10/1976     PCP: No primary care provider on file.   Reason for Endocrinology Evaluation: Type 2 Diabetes Mellitus  Initial Endocrine Consultative Visit: 09/23/2020    PATIENT IDENTIFIER: [name] is a 47 y.o. male with a past medical history of T2Dm, CAD ( S/P PCI), CHF, and dyslipidemia The patient has followed with Endocrinology clinic since 09/23/2020 for consultative assistance with management of his diabetes.  DIABETIC HISTORY:  [name] was diagnosed with DM 02/2020. His hemoglobin A1c has ranged from 11.3% in7/2021, peaking at 12.2% in 2021.  ON his initial visit to our clinic he had an A1c of 6.8% , we switched Metformin to XR , continued Lantus and Peter Russo    He was started on Ozempic 03/2021 by cardiology for weight loss in preparation  for heart transplant   SUBJECTIVE:   During the last visit (04/04/2022): A1c 5.7%     Today (10/07/2022): Peter Russo is here for diabetes management.  He checks his blood sugars every other day . The patient has not had hypoglycemic episodes since the last clinic visit.   Had a follow up with cardiology 02/2022  Denies nausea, vomiting or diarrhea except for loose stools with metformin  He just returned from a funeral and is feeling down   [place]:  Lantus 20 units daily Metformin 500 mg XR , 1 tablet twice daily Farxiga 10 mg, 1 tablet with Breakfast  Ozempic 2 mg weekly     Statin: yes ACE-I/ARB: yes    METER DOWNLOAD SUMMARY: Did not bring     DIABETIC COMPLICATIONS: Microvascular complications:   Denies: CKD, retinopathy, neuropathy  Last Eye Exam: Completed 09/2020  Macrovascular complications:  CAD ( S/P PCI) , CHF  Denies: CVA, PVD   HISTORY:  Past Medical History:  Past Medical History:  Diagnosis Date   Anxiety    CAD S/P percutaneous coronary angioplasty    a. s/p BMS-mLAD 2010. b. DES to RCA  2012. c. 04/2015: DES to LCx and PCI to LAD c/b dissection with subsequent overlapping DES to mLAD - r/i for NSTEMI afterwards; d. 08/2015 Ant STEMI in setting of noncompliance->110mAD ISR (3.0x16 Synergy DES); e. 03/2016 PCI of apical LAD; f. 03/2016 Relook Cath: patent LAD stents, RCA 100p CTO-->Med Rx; g. 10/2016 Ant STEMI: PTCA 100p LAD.   Chronic combined systolic and diastolic CHF (congestive heart failure) ([facility]    a. 03/2016 Echo: EF 30-35%, Gr2 DD;  b. 08/2016 Echo: EF 40%.   Depression    Essential hypertension    GAD (generalized anxiety disorder)    GERD (gastroesophageal reflux disease)    Hypercholesteremia    Ischemic cardiomyopathy    a. prior EF 25-35 percent at cath, 35-40 percent by echo; b. 03/2016 Echo: EF 30-35%, diff HK, Gr2 DD; c. 08/2016 Echo: EF 40%, base/mid inferior/inferowseptal AK, mildly dil LA.   Morbid obesity ([facility]    Pre-diabetes    SVT (supraventricular tachycardia) APRROX 10 YRS AGO   PRESUMED AVNRT, ECHO 12/07 WITH EF 65%, MILD LAE   Tobacco abuse    a. 20 + pack years, quit 10/2016.   Past Surgical History:  Past Surgical History:  Procedure Laterality Date   CARDIAC CATHETERIZATION N/A 04/23/2015   Procedure: Left Heart Cath and Coronary Angiography;  Surgeon: PJosue Hector MD;  [facility];  Service: Cardiovascular;  Laterality: N/A;   CARDIAC  CATHETERIZATION N/A 04/23/2015   Procedure: Coronary Stent Intervention;  Surgeon: Sherren Mocha, MD;  Location: [facility];  Service: Cardiovascular;  Laterality: N/A;   CARDIAC CATHETERIZATION N/A 09/14/2015   Procedure: Left Heart Cath and Coronary Angiography;  Surgeon: Belva Crome, MD;  LAD 100%, CFX 10% ISR, RCA 100% (chronic), EF 25-35%   CARDIAC CATHETERIZATION N/A 09/14/2015   Procedure: Coronary Stent Intervention;  Surgeon: Belva Crome, MD;  Location: [facility];  Service: Cardiovascular;  Laterality: N/A; 3.0 x 16 mm Synergy DES LAD   CARDIAC CATHETERIZATION  03/16/2016    CARDIAC CATHETERIZATION N/A 03/16/2016   Procedure: Left Heart Cath and Coronary Angiography;  Surgeon: Belva Crome, MD;  Location: [facility];  Service: Cardiovascular;  Laterality: N/A;   CARDIAC CATHETERIZATION N/A 03/16/2016   Procedure: Coronary Balloon Angioplasty;  Surgeon: Belva Crome, MD;  Location: [facility];  Service: Cardiovascular;  Laterality: N/A;   CARDIAC CATHETERIZATION N/A 03/25/2016   Procedure: Left Heart Cath and Coronary Angiography;  Surgeon: Peter M Martinique, MD;  Location: [facility];  Service: Cardiovascular;  Laterality: N/A;   CORONARY ANGIOPLASTY WITH STENT PLACEMENT  2010; 2013   CORONARY BALLOON ANGIOPLASTY N/A 10/19/2016   Procedure: Coronary Balloon Angioplasty;  Surgeon: Jettie Booze, MD;  Location: [facility];  Service: Cardiovascular;  Laterality: N/A;   INTRAVASCULAR ULTRASOUND/IVUS N/A 10/19/2016   Procedure: Intravascular Ultrasound/IVUS;  Surgeon: Jettie Booze, MD;  Location: [facility];  Service: Cardiovascular;  Laterality: N/A;   LEFT HEART CATH AND CORONARY ANGIOGRAPHY N/A 10/19/2016   Procedure: Left Heart Cath and Coronary Angiography;  Surgeon: Jettie Booze, MD;  Location: [facility];  Service: Cardiovascular;  Laterality: N/A;   RIGHT/LEFT HEART CATH AND CORONARY ANGIOGRAPHY N/A 05/21/2020   Procedure: RIGHT/LEFT HEART CATH AND CORONARY ANGIOGRAPHY;  Surgeon: Larey Dresser, MD;  Location: [facility];  Service: Cardiovascular;  Laterality: N/A;   Social History:  reports that he quit smoking about 5 years ago. His smoking use included cigarettes. He has a 24.00 pack-year smoking history. He has never used smokeless tobacco. He reports that he does not currently use drugs after having used the following drugs: Marijuana. He reports that he does not drink alcohol. Family History:  Family History  Problem Relation Age of Onset   Arrhythmia Mother        MOTHER LIVED TO BE 57    Coronary artery disease Mother    Heart attack Mother    Hypertension Mother    Coronary artery disease Father 14   Heart disease Father    Heart attack Father    Heart attack Brother    Stroke Neg Hx      HOME MEDICATIONS: Allergies as of 10/07/2022   No Known Allergies      Medication List        Accurate as of October 07, 2022  1:27 PM. If you have any questions, ask your nurse or doctor.          acetaminophen 325 MG tablet Commonly known as: TYLENOL Take 2 tablets (650 mg total) by mouth every 4 (four) hours as needed for headache or mild pain.   aspirin EC 81 MG tablet Take 1 tablet (81 mg total) by mouth daily.   atorvastatin 80 MG tablet Commonly known as: LIPITOR TAKE 1 TABLET BY MOUTH EVERY DAY AT 6 PM What changed:  how much  to take how to take this when to take this additional instructions   blood glucose meter kit and supplies Dispense based on patient and insurance preference. Use up to four times daily as directed. (FOR ICD-10 E10.9, E11.9).   buPROPion 150 MG 24 hr tablet Commonly known as: WELLBUTRIN XL TAKE 1 TABLET BY MOUTH EVERY DAY   carvedilol 12.5 MG tablet Commonly known as: COREG Take 18.75 mg by mouth 2 (two) times daily.   clopidogrel 75 MG tablet Commonly known as: PLAVIX Take 1 tablet (75 mg total) by mouth daily.   clotrimazole-betamethasone cream Commonly known as: LOTRISONE Apply 1 application  topically daily as needed (rash).   dapagliflozin propanediol 10 MG Tabs tablet Commonly known as: Farxiga Take 1 tablet (10 mg total) by mouth daily before breakfast.   digoxin 0.125 MG tablet Commonly known as: LANOXIN Take 1 tablet (125 mcg total) by mouth daily.   Entresto 97-103 MG Generic drug: sacubitril-valsartan Take 1 tablet by mouth 2 (two) times daily.   fenofibrate 160 MG tablet Take 1 tablet (160 mg total) by mouth daily.   furosemide 40 MG tablet Commonly known as: LASIX Take 1 tablet (40 mg total) by  mouth daily.   icosapent Ethyl 1 g capsule Commonly known as: Vascepa Take 2 capsules (2 g total) by mouth 2 (two) times daily.   Lantus SoloStar 100 UNIT/ML Solostar Pen Generic drug: insulin glargine Inject 20 Units into the skin daily.   metFORMIN 500 MG 24 hr tablet Commonly known as: GLUCOPHAGE-XR Take 1 tablet (500 mg total) by mouth 2 (two) times daily.   nitroGLYCERIN 0.4 MG SL tablet Commonly known as: NITROSTAT Place 1 tablet (0.4 mg total) under the tongue every 5 (five) minutes as needed for chest pain.   Ozempic (2 MG/DOSE) 8 MG/3ML Sopn Generic drug: Semaglutide (2 MG/DOSE) Inject 2 mg into the skin once a week.   Pen Needles 3/16" 31G X 5 MM Misc 1 Device by Other route daily in the afternoon. Lantus 23   potassium chloride SA 20 MEQ tablet Commonly known as: KLOR-CON M Take 2 tablets (40 mEq total) by mouth daily.   Repatha SureClick [id number] MG/ML Soaj Generic drug: Evolocumab INJECT 1 PEN INTO THE SKIN EVERY 14 (FOURTEEN) DAYS.   spironolactone 25 MG tablet Commonly known as: ALDACTONE Take 1 tablet (25 mg total) by mouth daily.   triamcinolone cream 0.1 % Commonly known as: KENALOG Apply 1 application  topically daily as needed (itching).         OBJECTIVE:   Vital Signs: There were no vitals taken for this visit.  Wt Readings from Last 3 Encounters:  09/08/22 244 lb (110.7 kg)  07/20/22 234 lb 9.1 oz (106.4 kg)  07/05/22 241 lb 9.6 oz (109.6 kg)     Exam: General: Pt appears well and is in NAD  Lungs: Clear with good BS bilat with no rales, rhonchi, or wheezes  Heart: RRR   Extremities: No pretibial edema.   Neuro: MS is good with appropriate affect, pt is alert and Ox3   DM FOOT Exam :04/04/2022  The skin of the feet is without sores or ulcerations. The pedal pulses are 1+ on right and 1+ on left. The sensation is intact to a screening 5.07, 10 gram monofilament bilaterally    DATA REVIEWED:  Lab Results  Component Value Date    HGBA1C 5.9 (H) 07/20/2022   HGBA1C 5.7 (A) 04/04/2022   HGBA1C 6.4 (A) 07/23/2021   Lab Results  Component Value Date   MICROALBUR 42.4 (H) 03/19/2021   LDLCALC 105 (H) 07/20/2022   CREATININE 1.20 09/08/2022   Lab Results  Component Value Date   MICRALBCREAT 72.8 (H) 03/19/2021     Lab Results  Component Value Date   CHOL 169 07/20/2022   HDL 44 07/20/2022   LDLCALC 105 (H) 07/20/2022   LDLDIRECT 51 02/01/2021   TRIG 100 07/20/2022   CHOLHDL 3.8 07/20/2022       Results for Peter Russo, Peter Russo (MRN [id number]) as of 03/19/2021 13:31  Ref. Range 03/19/2021 08:18  Sodium Latest Ref Range: 135 - 145 mEq/L 138  Potassium Latest Ref Range: 3.5 - 5.1 mEq/L 4.2  Chloride Latest Ref Range: 96 - 112 mEq/L 105  CO2 Latest Ref Range: 19 - 32 mEq/L 26  Glucose Latest Ref Range: 70 - 99 mg/dL 132 (H)  BUN Latest Ref Range: 6 - 23 mg/dL 18  Creatinine Latest Ref Range: 0.40 - 1.50 mg/dL 1.43  Calcium Latest Ref Range: 8.4 - 10.5 mg/dL 9.5  GFR Latest Ref Range: >60.00 mL/min 58.55 (L)  MICROALB/CREAT RATIO Latest Ref Range: 0.0 - 30.0 mg/g 72.8 (H)  Hemoglobin A1C Latest Ref Range: 4.6 - 6.5 % 7.5 (H)  Creatinine,U Latest Units: mg/dL 58.3  Microalb, Ur Latest Ref Range: 0.0 - 1.9 mg/dL 42.4 (H)    ASSESSMENT / PLAN / RECOMMENDATIONS:   1) Type 2 Diabetes Mellitus, Optimally controlled, With microalbuminuria - Most recent A1c of 5.7 %. Goal A1c < 7.0 %.     - A1c is optimal , will reduce insulin as below  - Intolerant to higher doses of Metformin  - He was started on Ozempic through his cardiologist to help with weight loss,in preparation for heart transplant    MEDICATIONS: Decrease  Lantus 20 units daily  Continue Metformin 500 mg XR BID Continue Farxiga 10 mg daily  Continue Ozempic 2 mg weekly    EDUCATION / INSTRUCTIONS: BG monitoring instructions: Patient is instructed to check his blood sugars 1 times a day, fasting. Call [facility] if: BG  persistently < 70  I reviewed the Rule of 15 for the treatment of hypoglycemia in detail with the patient. Literature supplied.    2) Diabetic complications:  Eye: Does not have known diabetic retinopathy.  Neuro/ Feet: Does not have known diabetic peripheral neuropathy .  Renal: Patient does not have known baseline CKD. He   is on an ACEI/ARB at present.   3) Dyslipidemia:   -He had an elevated TG up to 671 mg/DL in 10/2020, with improvement in his readings -Managed by cardiology   Medication Atorvastatin 80 mg daily  Repatha 140 mg q. 14 days    4) Microalbuminuria:  -Slight elevation in the MA/CR ratio at 72.8 (03/2021) -The patient is already on valsartan -We will continue to monitor  F/U in 6 months   Signed electronically by: Mack Guise, MD  [facility]  [facility] [address] [place], [place] [address] Phone: [phone] FAX: [phone]   CC: No primary care provider on file. No primary provider on file. Phone: None  Fax: None  Return to Endocrinology clinic as below: Future Appointments  Date Time Provider [place]  10/07/2022  3:00 PM Surya Folden, Melanie Crazier, MD [id number] None

## 2022-11-07 NOTE — Progress Notes (Unsigned)
Name: Peter Russo  Age/ Sex: 47 y.o., male   MRN/ DOB: [id number], 1976-05-14     PCP: Patient, No Pcp Per   Reason for Endocrinology Evaluation: Type 2 Diabetes Mellitus  Initial Endocrine Consultative Visit: 09/23/2020    PATIENT IDENTIFIER: Peter Russo is a 47 y.o. male with a past medical history of T2Dm, CAD ( S/P PCI), CHF, and dyslipidemia The patient has followed with Endocrinology clinic since 09/23/2020 for consultative assistance with management of his diabetes.  DIABETIC HISTORY:  Mr. Cugini was diagnosed with DM 02/2020. His hemoglobin A1c has ranged from 11.3% in7/2021, peaking at 12.2% in 2021.  ON his initial visit to our clinic he had an A1c of 6.8% , we switched Metformin to XR , continued Lantus and [place] Glade    He was started on Ozempic 03/2021 by cardiology for weight loss in preparation  for heart transplant   SUBJECTIVE:   During the last visit (04/04/2022): A1c 5.7%    Today (11/08/2022): Mr. [name] is here for diabetes management.  He checks his blood sugars every other day . The patient has not had hypoglycemic episodes since the last clinic visit.  Since his last visit here, patient was hospitalized 07/2022 for acute CVA He continues to follow-up with cardiology for CHF Denies tingling or weakness   HOME DIABETES REGIMEN:  Lantus 26 units daily Metformin 500 mg XR , 1 tablet twice daily Farxiga 10 mg, 1 tablet with Breakfast  Ozempic 2 mg weekly     Statin: yes ACE-I/ARB: yes    METER DOWNLOAD SUMMARY: Did not bring     DIABETIC COMPLICATIONS: Microvascular complications:   Denies: CKD, retinopathy, neuropathy  Last Eye Exam: Completed 09/2020  Macrovascular complications:  CAD ( S/P PCI) , CHF , CVA 07/2022 Denies:  PVD   HISTORY:  Past Medical History:  Past Medical History:  Diagnosis Date   Anxiety    CAD S/P percutaneous coronary angioplasty    a. s/p BMS-mLAD 2010. b. DES to RCA 2012. c. 04/2015: DES to  LCx and PCI to LAD c/b dissection with subsequent overlapping DES to mLAD - r/i for NSTEMI afterwards; d. 08/2015 Ant STEMI in setting of noncompliance->144mLAD ISR (3.0x16 Synergy DES); e. 03/2016 PCI of apical LAD; f. 03/2016 Relook Cath: patent LAD stents, RCA 100p CTO-->Med Rx; g. 10/2016 Ant STEMI: PTCA 100p LAD.   Chronic combined systolic and diastolic CHF (congestive heart failure) ([place])    a. 03/2016 Echo: EF 30-35%, Gr2 DD;  b. 08/2016 Echo: EF 40%.   Depression    Essential hypertension    GAD (generalized anxiety disorder)    GERD (gastroesophageal reflux disease)    Hypercholesteremia    Ischemic cardiomyopathy    a. prior EF 25-35 percent at cath, 35-40 percent by echo; b. 03/2016 Echo: EF 30-35%, diff HK, Gr2 DD; c. 08/2016 Echo: EF 40%, base/mid inferior/inferowseptal AK, mildly dil LA.   Morbid obesity ([place])    Pre-diabetes    SVT (supraventricular tachycardia) APRROX 10 YRS AGO   PRESUMED AVNRT, ECHO 12/07 WITH EF 65%, MILD LAE   Tobacco abuse    a. 20 + pack years, quit 10/2016.   Past Surgical History:  Past Surgical History:  Procedure Laterality Date   CARDIAC CATHETERIZATION N/A 04/23/2015   Procedure: Left Heart Cath and Coronary Angiography;  Surgeon: Josue Hector, MD;  Location: [place] [facility];  Service: Cardiovascular;  Laterality: N/A;   CARDIAC CATHETERIZATION N/A 04/23/2015   Procedure: Coronary Stent  Intervention;  Surgeon: Sherren Mocha, MD;  Location: [place] [facility];  Service: Cardiovascular;  Laterality: N/A;   CARDIAC CATHETERIZATION N/A 09/14/2015   Procedure: Left Heart Cath and Coronary Angiography;  Surgeon: Belva Crome, MD;  LAD 100%, CFX 10% ISR, RCA 100% (chronic), EF 25-35%   CARDIAC CATHETERIZATION N/A 09/14/2015   Procedure: Coronary Stent Intervention;  Surgeon: Belva Crome, MD;  Location: [place] [facility];  Service: Cardiovascular;  Laterality: N/A; 3.0 x 16 mm Synergy DES LAD   CARDIAC CATHETERIZATION  03/16/2016   CARDIAC CATHETERIZATION  N/A 03/16/2016   Procedure: Left Heart Cath and Coronary Angiography;  Surgeon: Belva Crome, MD;  Location: [place] [facility];  Service: Cardiovascular;  Laterality: N/A;   CARDIAC CATHETERIZATION N/A 03/16/2016   Procedure: Coronary Balloon Angioplasty;  Surgeon: Belva Crome, MD;  Location: [place] [facility];  Service: Cardiovascular;  Laterality: N/A;   CARDIAC CATHETERIZATION N/A 03/25/2016   Procedure: Left Heart Cath and Coronary Angiography;  Surgeon: Peter M Martinique, MD;  Location: [place] [facility];  Service: Cardiovascular;  Laterality: N/A;   CORONARY ANGIOPLASTY WITH STENT PLACEMENT  2010; 2013   CORONARY BALLOON ANGIOPLASTY N/A 10/19/2016   Procedure: Coronary Balloon Angioplasty;  Surgeon: Jettie Booze, MD;  Location: [place] [facility];  Service: Cardiovascular;  Laterality: N/A;   INTRAVASCULAR ULTRASOUND/IVUS N/A 10/19/2016   Procedure: Intravascular Ultrasound/IVUS;  Surgeon: Jettie Booze, MD;  Location: [place] [facility];  Service: Cardiovascular;  Laterality: N/A;   LEFT HEART CATH AND CORONARY ANGIOGRAPHY N/A 10/19/2016   Procedure: Left Heart Cath and Coronary Angiography;  Surgeon: Jettie Booze, MD;  Location: [place] [facility];  Service: Cardiovascular;  Laterality: N/A;   RIGHT/LEFT HEART CATH AND CORONARY ANGIOGRAPHY N/A 05/21/2020   Procedure: RIGHT/LEFT HEART CATH AND CORONARY ANGIOGRAPHY;  Surgeon: Larey Dresser, MD;  Location: [place] [facility];  Service: Cardiovascular;  Laterality: N/A;   Social History:  reports that he quit smoking about 6 years ago. His smoking use included cigarettes. He has a 24.00 pack-year smoking history. He has never used smokeless tobacco. He reports that he does not currently use drugs after having used the following drugs: Marijuana. He reports that he does not drink alcohol. Family History:  Family History  Problem Relation Age of Onset   Arrhythmia Mother        MOTHER LIVED TO BE 22   Coronary artery disease  Mother    Heart attack Mother    Hypertension Mother    Coronary artery disease Father 68   Heart disease Father    Heart attack Father    Heart attack Brother    Stroke Neg Hx      HOME MEDICATIONS: Allergies as of 11/08/2022   No Known Allergies      Medication List        Accurate as of November 08, 2022 11:19 AM. If you have any questions, ask your nurse or doctor.          acetaminophen 325 MG tablet Commonly known as: TYLENOL Take 2 tablets (650 mg total) by mouth every 4 (four) hours as needed for headache or mild pain.   aspirin EC 81 MG tablet Take 1 tablet (81 mg total) by mouth daily.   atorvastatin 80 MG tablet Commonly known as: LIPITOR TAKE 1 TABLET BY MOUTH EVERY DAY AT 6 PM What changed:  how much to take how to take this when to take  this additional instructions   blood glucose meter kit and supplies Dispense based on patient and insurance preference. Use up to four times daily as directed. (FOR ICD-10 E10.9, E11.9).   buPROPion 150 MG 24 hr tablet Commonly known as: WELLBUTRIN XL TAKE 1 TABLET BY MOUTH EVERY DAY   carvedilol 12.5 MG tablet Commonly known as: COREG Take 18.75 mg by mouth 2 (two) times daily.   clopidogrel 75 MG tablet Commonly known as: PLAVIX Take 1 tablet (75 mg total) by mouth daily.   clotrimazole-betamethasone cream Commonly known as: LOTRISONE Apply 1 application  topically daily as needed (rash).   dapagliflozin propanediol 10 MG Tabs tablet Commonly known as: Farxiga Take 1 tablet (10 mg total) by mouth daily before breakfast.   digoxin 0.125 MG tablet Commonly known as: LANOXIN Take 1 tablet (125 mcg total) by mouth daily.   Entresto 97-103 MG Generic drug: sacubitril-valsartan Take 1 tablet by mouth 2 (two) times daily.   fenofibrate 160 MG tablet Take 1 tablet (160 mg total) by mouth daily.   furosemide 40 MG tablet Commonly known as: LASIX Take 1 tablet (40 mg total) by mouth daily.   icosapent  Ethyl 1 g capsule Commonly known as: Vascepa Take 2 capsules (2 g total) by mouth 2 (two) times daily.   Lantus SoloStar 100 UNIT/ML Solostar Pen Generic drug: insulin glargine Inject 20 Units into the skin daily.   metFORMIN 500 MG 24 hr tablet Commonly known as: GLUCOPHAGE-XR Take 1 tablet (500 mg total) by mouth 2 (two) times daily.   nitroGLYCERIN 0.4 MG SL tablet Commonly known as: NITROSTAT Place 1 tablet (0.4 mg total) under the tongue every 5 (five) minutes as needed for chest pain.   Ozempic (2 MG/DOSE) 8 MG/3ML Sopn Generic drug: Semaglutide (2 MG/DOSE) Inject 2 mg into the skin once a week.   Pen Needles 3/16" 31G X 5 MM Misc 1 Device by Other route daily in the afternoon. Lantus 23   potassium chloride SA 20 MEQ tablet Commonly known as: KLOR-CON M Take 2 tablets (40 mEq total) by mouth daily.   Repatha SureClick [id number] MG/ML Soaj Generic drug: Evolocumab INJECT 1 PEN INTO THE SKIN EVERY 14 (FOURTEEN) DAYS.   spironolactone 25 MG tablet Commonly known as: ALDACTONE Take 1 tablet (25 mg total) by mouth daily.   triamcinolone cream 0.1 % Commonly known as: KENALOG Apply 1 application  topically daily as needed (itching).         OBJECTIVE:   Vital Signs: BP (!) 140/80 (BP Location: Left Arm, Patient Position: Sitting, Cuff Size: Large)   Pulse 74   Ht 5\' 7"  (1.702 m)   Wt 244 lb (110.7 kg)   SpO2 96%   BMI 38.22 kg/m   Wt Readings from Last 3 Encounters:  11/08/22 244 lb (110.7 kg)  09/08/22 244 lb (110.7 kg)  07/20/22 234 lb 9.1 oz (106.4 kg)     Exam: General: Pt appears well and is in NAD  Lungs: Clear with good BS bilat with no rales, rhonchi, or wheezes  Heart: RRR   Extremities: No pretibial edema.   Neuro: MS is good with appropriate affect, pt is alert and Ox3   DM FOOT Exam :11/08/2022  The skin of the feet is without sores or ulcerations. The pedal pulses are 1+ on right and 1+ on left. The sensation is intact to a screening 5.07,  10 gram monofilament bilaterally    DATA REVIEWED:  Lab Results  Component Value Date  HGBA1C 6.1 (A) 11/08/2022   HGBA1C 5.9 (H) 07/20/2022   HGBA1C 5.7 (A) 04/04/2022     Latest Reference Range & Units 09/08/22 11:48  Potassium 3.5 - 5.1 mEq/L 4.1  Chloride 96 - 112 mEq/L 105  CO2 19 - 32 mEq/L 27  Glucose 70 - 99 mg/dL 93  BUN 6 - 23 mg/dL 14  Creatinine 0.40 - 1.50 mg/dL 1.20  Calcium 8.4 - 10.5 mg/dL 9.4  Alkaline Phosphatase 39 - 117 U/L 64  Albumin 3.5 - 5.2 g/dL 4.5  AST 0 - 37 U/L 13  ALT 0 - 53 U/L 13  Total Protein 6.0 - 8.3 g/dL 7.4  Total Bilirubin 0.2 - 1.2 mg/dL 0.7  GFR >60.00 mL/min 71.52    Latest Reference Range & Units 11/08/22 11:30  MICROALB/CREAT RATIO 0.0 - 30.0 mg/g 83.0 (H)  (H): Data is abnormally high  ASSESSMENT / PLAN / RECOMMENDATIONS:   1) Type 2 Diabetes Mellitus, Optimally controlled, With microalbuminuria - Most recent A1c of 6.1 %. Goal A1c < 7.0 %.     - A1c is optimal , will reduce insulin as below  - Intolerant to higher doses of Metformin  - He was started on Ozempic through his cardiologist to help with weight loss,in preparation for heart transplant    MEDICATIONS: Decrease  Lantus 22 units daily  Continue Metformin 500 mg XR BID Continue Farxiga 10 mg daily  Continue Ozempic 2 mg weekly    EDUCATION / INSTRUCTIONS: BG monitoring instructions: Patient is instructed to check his blood sugars 1 times a day, fasting. Call [facility] if: BG persistently < 70  I reviewed the Rule of 15 for the treatment of hypoglycemia in detail with the patient. Literature supplied.    2) Diabetic complications:  Eye: Does not have known diabetic retinopathy.  Neuro/ Feet: Does not have known diabetic peripheral neuropathy .  Renal: Patient does not have known baseline CKD. He   is on an ACEI/ARB at present.   3) Dyslipidemia:   - Per cardiology    4) Microalbuminuria:  -Slight elevation in the MA/CR ratio at  72.8 (03/2021) -The patient is already on valsartan -We will continue to monitor    F/U in 6 months   Signed electronically by: Mack Guise, MD  [facility]  [facility] [address] [place], [place] [address] Phone: [phone] FAX: [phone]   CC: Patient, No Pcp Per No address on file Phone: None  Fax: None  Return to Endocrinology clinic as below: No future appointments.

## 2022-11-08 ENCOUNTER — Other Ambulatory Visit: Payer: Self-pay | Admitting: Internal Medicine

## 2022-11-08 ENCOUNTER — Ambulatory Visit (INDEPENDENT_AMBULATORY_CARE_PROVIDER_SITE_OTHER): Payer: 59 | Admitting: Internal Medicine

## 2022-11-08 ENCOUNTER — Other Ambulatory Visit: Payer: Self-pay

## 2022-11-08 ENCOUNTER — Encounter: Payer: Self-pay | Admitting: Internal Medicine

## 2022-11-08 VITALS — BP 124/82 | HR 74 | Ht 67.0 in | Wt 244.0 lb

## 2022-11-08 DIAGNOSIS — Z794 Long term (current) use of insulin: Secondary | ICD-10-CM | POA: Diagnosis not present

## 2022-11-08 DIAGNOSIS — E1129 Type 2 diabetes mellitus with other diabetic kidney complication: Secondary | ICD-10-CM | POA: Diagnosis not present

## 2022-11-08 DIAGNOSIS — E1159 Type 2 diabetes mellitus with other circulatory complications: Secondary | ICD-10-CM

## 2022-11-08 DIAGNOSIS — R809 Proteinuria, unspecified: Secondary | ICD-10-CM | POA: Diagnosis not present

## 2022-11-08 LAB — POCT GLYCOSYLATED HEMOGLOBIN (HGB A1C): Hemoglobin A1C: 6.1 % — AB (ref 4.0–5.6)

## 2022-11-08 LAB — MICROALBUMIN / CREATININE URINE RATIO
Creatinine,U: 58.5 mg/dL
Microalb Creat Ratio: 83 mg/g — ABNORMAL HIGH (ref 0.0–30.0)
Microalb, Ur: 48.6 mg/dL — ABNORMAL HIGH (ref 0.0–1.9)

## 2022-11-08 LAB — POCT GLUCOSE (DEVICE FOR HOME USE): Glucose Fasting, POC: 141 mg/dL — AB (ref 70–99)

## 2022-11-08 MED ORDER — INSULIN GLARGINE 100 UNIT/ML SOLOSTAR PEN: 22.0000 [IU] | PEN_INJECTOR | Freq: Every day | SUBCUTANEOUS | 3 refills | Status: DC

## 2022-11-08 MED ORDER — METFORMIN HCL ER 500 MG PO TB24: 500.0000 mg | ORAL_TABLET | Freq: Two times a day (BID) | ORAL | 3 refills | Status: DC

## 2022-11-08 MED ORDER — "PEN NEEDLES 3/16"" 31G X 5 MM MISC": 1.0000 | Freq: Every day | 3 refills | Status: DC

## 2022-11-08 MED ORDER — DAPAGLIFLOZIN PROPANEDIOL 10 MG PO TABS: 10.0000 mg | ORAL_TABLET | Freq: Every day | ORAL | 3 refills | Status: DC

## 2022-11-08 MED ORDER — OZEMPIC (2 MG/DOSE) 8 MG/3ML ~~LOC~~ SOPN: 2.0000 mg | PEN_INJECTOR | SUBCUTANEOUS | 3 refills | Status: DC

## 2022-11-08 MED ORDER — LANTUS SOLOSTAR 100 UNIT/ML ~~LOC~~ SOPN: 22.0000 [IU] | PEN_INJECTOR | Freq: Every day | SUBCUTANEOUS | 3 refills | Status: DC

## 2022-11-08 NOTE — Patient Instructions (Signed)
-   Decrease Lantus 22 units daily  - Continue Metformin 500 mg XR , 1 tablet twice daily  - Continue Farxiga 10 mg, 1 tablet with Breakfast  - Continue Ozempic 2 mg weekly     HOW TO TREAT LOW BLOOD SUGARS (Blood sugar LESS THAN 70 MG/DL) Please follow the RULE OF 15 for the treatment of hypoglycemia treatment (when your (blood sugars are less than 70 mg/dL)   STEP 1: Take 15 grams of carbohydrates when your blood sugar is low, which includes:  3-4 GLUCOSE TABS  OR 3-4 OZ OF JUICE OR REGULAR SODA OR ONE TUBE OF GLUCOSE GEL    STEP 2: RECHECK blood sugar in 15 MINUTES STEP 3: If your blood sugar is still low at the 15 minute recheck --> then, go back to STEP 1 and treat AGAIN with another 15 grams of carbohydrates.

## 2022-11-26 ENCOUNTER — Other Ambulatory Visit (HOSPITAL_COMMUNITY): Payer: Self-pay | Admitting: Family Medicine

## 2023-01-05 ENCOUNTER — Telehealth (HOSPITAL_COMMUNITY): Payer: Self-pay | Admitting: Cardiology

## 2023-01-05 NOTE — Telephone Encounter (Signed)
[facility] called with pre procedure recommendations Pt scheduled for deep cleaning and extraction x 1  Chart reviewed with Emer,RN No pre procedure meds needed May hold anticoag prior to appt  [facility] aware

## 2023-01-24 ENCOUNTER — Telehealth (HOSPITAL_COMMUNITY): Payer: Self-pay

## 2023-01-24 NOTE — Telephone Encounter (Signed)
Fax'd medical clearance to number [phone]. Confirmation received

## 2023-02-28 ENCOUNTER — Other Ambulatory Visit (HOSPITAL_COMMUNITY): Payer: Self-pay | Admitting: Family Medicine

## 2023-02-28 ENCOUNTER — Other Ambulatory Visit (HOSPITAL_COMMUNITY): Payer: Self-pay | Admitting: Internal Medicine

## 2023-04-07 ENCOUNTER — Other Ambulatory Visit (HOSPITAL_COMMUNITY): Payer: Self-pay | Admitting: Family Medicine

## 2023-05-15 ENCOUNTER — Encounter: Payer: Self-pay | Admitting: Internal Medicine

## 2023-05-15 ENCOUNTER — Ambulatory Visit (INDEPENDENT_AMBULATORY_CARE_PROVIDER_SITE_OTHER): Payer: 59 | Admitting: Internal Medicine

## 2023-05-15 VITALS — BP 126/82 | HR 90 | Ht 67.0 in | Wt 234.0 lb

## 2023-05-15 DIAGNOSIS — E1129 Type 2 diabetes mellitus with other diabetic kidney complication: Secondary | ICD-10-CM | POA: Diagnosis not present

## 2023-05-15 DIAGNOSIS — Z794 Long term (current) use of insulin: Secondary | ICD-10-CM | POA: Diagnosis not present

## 2023-05-15 DIAGNOSIS — E1159 Type 2 diabetes mellitus with other circulatory complications: Secondary | ICD-10-CM | POA: Diagnosis not present

## 2023-05-15 DIAGNOSIS — R809 Proteinuria, unspecified: Secondary | ICD-10-CM | POA: Diagnosis not present

## 2023-05-15 LAB — POCT GLUCOSE (DEVICE FOR HOME USE): Glucose Fasting, POC: 146 mg/dL — AB (ref 70–99)

## 2023-05-15 LAB — POCT GLYCOSYLATED HEMOGLOBIN (HGB A1C): Hemoglobin A1C: 5.6 % (ref 4.0–5.6)

## 2023-05-15 MED ORDER — DAPAGLIFLOZIN PROPANEDIOL 10 MG PO TABS: 10.0000 mg | ORAL_TABLET | Freq: Every day | ORAL | 3 refills | Status: DC

## 2023-05-15 MED ORDER — METFORMIN HCL ER 500 MG PO TB24: 500.0000 mg | ORAL_TABLET | Freq: Two times a day (BID) | ORAL | 3 refills | Status: DC

## 2023-05-15 MED ORDER — OZEMPIC (2 MG/DOSE) 8 MG/3ML ~~LOC~~ SOPN: 2.0000 mg | PEN_INJECTOR | SUBCUTANEOUS | 3 refills | Status: DC

## 2023-05-15 NOTE — Patient Instructions (Addendum)
-   Stop lantus  - Continue Metformin 500 mg XR , 1 tablet twice daily  - Continue Farxiga 10 mg, 1 tablet with Breakfast  - Continue Ozempic 2 mg weekly     HOW TO TREAT LOW BLOOD SUGARS (Blood sugar LESS THAN 70 MG/DL) Please follow the RULE OF 15 for the treatment of hypoglycemia treatment (when your (blood sugars are less than 70 mg/dL)   STEP 1: Take 15 grams of carbohydrates when your blood sugar is low, which includes:  3-4 GLUCOSE TABS  OR 3-4 OZ OF JUICE OR REGULAR SODA OR ONE TUBE OF GLUCOSE GEL    STEP 2: RECHECK blood sugar in 15 MINUTES STEP 3: If your blood sugar is still low at the 15 minute recheck --> then, go back to STEP 1 and treat AGAIN with another 15 grams of carbohydrates.

## 2023-05-15 NOTE — Progress Notes (Signed)
Name: Peter Russo  Age/ Sex: 47 y.o., male   MRN/ DOB: [id number], [birth year]     PCP: Patient, No Pcp Per   Reason for Endocrinology Evaluation: Type 2 Diabetes Mellitus  Initial Endocrine Consultative Visit: 09/23/2020    PATIENT IDENTIFIER: [name] is a 47 y.o. male with a past medical history of T2Dm, CAD ( S/P PCI), CHF, and dyslipidemia The patient has followed with Endocrinology clinic since 09/23/2020 for consultative assistance with management of his diabetes.  DIABETIC HISTORY:  Mr. Trimmer was diagnosed with DM 02/2020. His hemoglobin A1c has ranged from 11.3% in7/2021, peaking at 12.2% in 2021.  ON his initial visit to our clinic he had an A1c of 6.8% , we switched Metformin to XR , continued Lantus and Marcelline Deist    He was started on Ozempic 03/2021 by cardiology for weight loss in preparation  for heart transplant   SUBJECTIVE:   During the last visit (11/08/2022): A1c 6.1 %    Today (05/15/2023): Mr. Billick is here for diabetes management.  He checks his blood sugars every other day . The patient has not had hypoglycemic episodes since the last clinic visit.   Weight continues to fluctuate  Denies nausea or vomiting  Denies constipation or diarrhea  He continues to follow-up with cardiology for CHF  He does not take insulin on regular basis , has not taken it in months    HOME DIABETES REGIMEN:  Lantus 22 units daily Metformin 500 mg XR , 1 tablet twice daily Farxiga 10 mg, 1 tablet with Breakfast  Ozempic 2 mg weekly     Statin: yes ACE-I/ARB: yes    METER DOWNLOAD SUMMARY: Did not bring     DIABETIC COMPLICATIONS: Microvascular complications:   Denies: CKD, retinopathy, neuropathy  Last Eye Exam: Completed 09/2020  Macrovascular complications:  CAD ( S/P PCI) , CHF , CVA 07/2022 Denies:  PVD   HISTORY:  Past Medical History:  Past Medical History:  Diagnosis Date   Anxiety    CAD S/P percutaneous coronary  angioplasty    a. s/p BMS-mLAD 2010. b. DES to RCA 2012. c. 04/2015: DES to LCx and PCI to LAD c/b dissection with subsequent overlapping DES to mLAD - r/i for NSTEMI afterwards; d. 08/2015 Ant STEMI in setting of noncompliance->196mLAD ISR (3.0x16 Synergy DES); e. 03/2016 PCI of apical LAD; f. 03/2016 Relook Cath: patent LAD stents, RCA 100p CTO-->Med Rx; g. 10/2016 Ant STEMI: PTCA 100p LAD.   Chronic combined systolic and diastolic CHF (congestive heart failure) (HCC)    a. 03/2016 Echo: EF 30-35%, Gr2 DD;  b. 08/2016 Echo: EF 40%.   Depression    Essential hypertension    GAD (generalized anxiety disorder)    GERD (gastroesophageal reflux disease)    Hypercholesteremia    Ischemic cardiomyopathy    a. prior EF 25-35 percent at cath, 35-40 percent by echo; b. 03/2016 Echo: EF 30-35%, diff HK, Gr2 DD; c. 08/2016 Echo: EF 40%, base/mid inferior/inferowseptal AK, mildly dil LA.   Morbid obesity (HCC)    Pre-diabetes    SVT (supraventricular tachycardia) APRROX 10 YRS AGO   PRESUMED AVNRT, ECHO 12/07 WITH EF 65%, MILD LAE   Tobacco abuse    a. 20 + pack years, quit 10/2016.   Past Surgical History:  Past Surgical History:  Procedure Laterality Date   CARDIAC CATHETERIZATION N/A 04/23/2015   Procedure: Left Heart Cath and Coronary Angiography;  Surgeon: Wendall Stade, MD;  Location: [facility] INVASIVE CV  LAB;  Service: Cardiovascular;  Laterality: N/A;   CARDIAC CATHETERIZATION N/A 04/23/2015   Procedure: Coronary Stent Intervention;  Surgeon: Tonny Bollman, MD;  Location: [facility] INVASIVE CV LAB;  Service: Cardiovascular;  Laterality: N/A;   CARDIAC CATHETERIZATION N/A 09/14/2015   Procedure: Left Heart Cath and Coronary Angiography;  Surgeon: Lyn Records, MD;  LAD 100%, CFX 10% ISR, RCA 100% (chronic), EF 25-35%   CARDIAC CATHETERIZATION N/A 09/14/2015   Procedure: Coronary Stent Intervention;  Surgeon: Lyn Records, MD;  Location: [facility] INVASIVE CV LAB;  Service: Cardiovascular;  Laterality: N/A; 3.0 x 16 mm  Synergy DES LAD   CARDIAC CATHETERIZATION  03/16/2016   CARDIAC CATHETERIZATION N/A 03/16/2016   Procedure: Left Heart Cath and Coronary Angiography;  Surgeon: Lyn Records, MD;  Location: [facility] INVASIVE CV LAB;  Service: Cardiovascular;  Laterality: N/A;   CARDIAC CATHETERIZATION N/A 03/16/2016   Procedure: Coronary Balloon Angioplasty;  Surgeon: Lyn Records, MD;  Location: [facility] INVASIVE CV LAB;  Service: Cardiovascular;  Laterality: N/A;   CARDIAC CATHETERIZATION N/A 03/25/2016   Procedure: Left Heart Cath and Coronary Angiography;  Surgeon: Peter M Swaziland, MD;  Location: [facility] INVASIVE CV LAB;  Service: Cardiovascular;  Laterality: N/A;   CORONARY ANGIOPLASTY WITH STENT PLACEMENT  2010; 2013   CORONARY BALLOON ANGIOPLASTY N/A 10/19/2016   Procedure: Coronary Balloon Angioplasty;  Surgeon: Corky Crafts, MD;  Location: [facility] INVASIVE CV LAB;  Service: Cardiovascular;  Laterality: N/A;   CORONARY ULTRASOUND/IVUS N/A 10/19/2016   Procedure: Intravascular Ultrasound/IVUS;  Surgeon: Corky Crafts, MD;  Location: [facility] INVASIVE CV LAB;  Service: Cardiovascular;  Laterality: N/A;   LEFT HEART CATH AND CORONARY ANGIOGRAPHY N/A 10/19/2016   Procedure: Left Heart Cath and Coronary Angiography;  Surgeon: Corky Crafts, MD;  Location: [facility] INVASIVE CV LAB;  Service: Cardiovascular;  Laterality: N/A;   RIGHT/LEFT HEART CATH AND CORONARY ANGIOGRAPHY N/A 05/21/2020   Procedure: RIGHT/LEFT HEART CATH AND CORONARY ANGIOGRAPHY;  Surgeon: Laurey Morale, MD;  Location: [facility] INVASIVE CV LAB;  Service: Cardiovascular;  Laterality: N/A;   Social History:  reports that he quit smoking about 6 years ago. His smoking use included cigarettes. He started smoking about 30 years ago. He has a 24 pack-year smoking history. He has never used smokeless tobacco. He reports that he does not currently use drugs after having used the following drugs: Marijuana. He reports that he does not drink alcohol. Family History:  Family History   Problem Relation Age of Onset   Arrhythmia Mother        MOTHER LIVED TO BE 57   Coronary artery disease Mother    Heart attack Mother    Hypertension Mother    Coronary artery disease Father 54   Heart disease Father    Heart attack Father    Heart attack Brother    Stroke Neg Hx      HOME MEDICATIONS: Allergies as of 05/15/2023   No Known Allergies      Medication List        Accurate as of May 15, 2023 10:33 AM. If you have any questions, ask your nurse or doctor.          acetaminophen 325 MG tablet Commonly known as: TYLENOL Take 2 tablets (650 mg total) by mouth every 4 (four) hours as needed for headache or mild pain.   aspirin EC 81 MG tablet Take 1 tablet (81 mg total) by mouth daily.   atorvastatin 80 MG tablet Commonly known as:  LIPITOR TAKE 1 TABLET BY MOUTH EVERY DAY AT 6 PM   blood glucose meter kit and supplies Dispense based on patient and insurance preference. Use up to four times daily as directed. (FOR ICD-10 E10.9, E11.9).   buPROPion 150 MG 24 hr tablet Commonly known as: WELLBUTRIN XL TAKE 1 TABLET BY MOUTH EVERY DAY   carvedilol 25 MG tablet Commonly known as: COREG TAKE 1 TABLET (25 MG TOTAL) BY MOUTH 2 (TWO) TIMES DAILY WITH A MEAL. PLEASE CALL TO SCHEDULE FOLLOW UP   clopidogrel 75 MG tablet Commonly known as: PLAVIX Take 1 tablet (75 mg total) by mouth daily.   clotrimazole-betamethasone cream Commonly known as: LOTRISONE Apply 1 application  topically daily as needed (rash).   dapagliflozin propanediol 10 MG Tabs tablet Commonly known as: Farxiga Take 1 tablet (10 mg total) by mouth daily before breakfast.   digoxin 0.125 MG tablet Commonly known as: LANOXIN TAKE 1 TABLET BY MOUTH EVERY DAY   Entresto 97-103 MG Generic drug: sacubitril-valsartan Take 1 tablet by mouth 2 (two) times daily.   fenofibrate 160 MG tablet Take 1 tablet (160 mg total) by mouth daily.   furosemide 40 MG tablet Commonly known as:  LASIX Take 1 tablet (40 mg total) by mouth daily.   icosapent Ethyl 1 g capsule Commonly known as: Vascepa Take 2 capsules (2 g total) by mouth 2 (two) times daily.   insulin glargine 100 unit/mL Sopn Commonly known as: LANTUS Inject 22 Units into the skin daily.   insulin glargine 100 UNIT/ML Solostar Pen Commonly known as: LANTUS Inject 22 Units into the skin daily.   metFORMIN 500 MG 24 hr tablet Commonly known as: GLUCOPHAGE-XR Take 1 tablet (500 mg total) by mouth 2 (two) times daily.   nitroGLYCERIN 0.4 MG SL tablet Commonly known as: NITROSTAT Place 1 tablet (0.4 mg total) under the tongue every 5 (five) minutes as needed for chest pain.   Ozempic (2 MG/DOSE) 8 MG/3ML Sopn Generic drug: Semaglutide (2 MG/DOSE) Inject 2 mg into the skin once a week.   Pen Needles 3/16" 31G X 5 MM Misc 1 Device by Other route daily in the afternoon. Lantus 23   potassium chloride SA 20 MEQ tablet Commonly known as: KLOR-CON M Take 2 tablets (40 mEq total) by mouth daily.   Repatha SureClick 140 MG/ML Soaj Generic drug: Evolocumab INJECT 1 PEN INTO THE SKIN EVERY 14 (FOURTEEN) DAYS.   spironolactone 25 MG tablet Commonly known as: ALDACTONE Take 1 tablet (25 mg total) by mouth daily.   triamcinolone cream 0.1 % Commonly known as: KENALOG Apply 1 application  topically daily as needed (itching).         OBJECTIVE:   Vital Signs: BP 126/82 (BP Location: Left Arm, Patient Position: Sitting, Cuff Size: Large)   Pulse 90   Ht 5\' 7"  (1.702 m)   Wt 234 lb (106.1 kg)   SpO2 99%   BMI 36.65 kg/m   Wt Readings from Last 3 Encounters:  05/15/23 234 lb (106.1 kg)  11/08/22 244 lb (110.7 kg)  09/08/22 244 lb (110.7 kg)     Exam: General: Pt appears well and is in NAD  Lungs: Clear with good BS bilat   Heart: RRR   Extremities: No pretibial edema.   Neuro: MS is good with appropriate affect, pt is alert and Ox3   DM FOOT Exam :11/08/2022  The skin of the feet is  without sores or ulcerations. The pedal pulses are 1+ on right  and 1+ on left. The sensation is intact to a screening 5.07, 10 gram monofilament bilaterally    DATA REVIEWED:  Lab Results  Component Value Date   HGBA1C 6.1 (A) 11/08/2022   HGBA1C 5.9 (H) 07/20/2022   HGBA1C 5.7 (A) 04/04/2022   12/29/2022 per nephrology   Ptn/Cr ratio 1542 Vit D 8.4 ng/mL BUN 18 Cr. 1.25 GFR 71   Latest Reference Range & Units 11/08/22 11:30  MICROALB/CREAT RATIO 0.0 - 30.0 mg/g 83.0 (H)  (H): Data is abnormally high  ASSESSMENT / PLAN / RECOMMENDATIONS:   1) Type 2 Diabetes Mellitus, Optimally controlled, With microalbuminuria - Most recent A1c of 5.6 %. Goal A1c < 7.0 %.     - A1c is optimal  -Patient tells me that he has not taken Lantus in months, will discontinue off medication list - Intolerant to higher doses of Metformin  - He was started on Ozempic through his cardiologist to help with weight loss,in preparation for heart transplant    MEDICATIONS: Stop Lantus Continue Metformin 500 mg XR BID Continue Farxiga 10 mg daily  Continue Ozempic 2 mg weekly    EDUCATION / INSTRUCTIONS: BG monitoring instructions: Patient is instructed to check his blood sugars 1 times a day, fasting. Call [facility] if: BG persistently < 70  I reviewed the Rule of 15 for the treatment of hypoglycemia in detail with the patient. Literature supplied.    2) Diabetic complications:  Eye: Does not have known diabetic retinopathy.  Neuro/ Feet: Does not have known diabetic peripheral neuropathy .  Renal: Patient does not have known baseline CKD. He   is on an ACEI/ARB at present.   3) Dyslipidemia:   - Per cardiology    4) Microalbuminuria:  - MA/CR ratio remains elevated -The patient is already on valsartan -He follows with nephrology    F/U in 6 months   Signed electronically by: Lyndle Herrlich, MD  [facility]  [facility] [address] [place] [place], [place] [address] Phone: [phone] FAX: [phone]   CC: Patient, No Pcp Per No address on file Phone: None  Fax: None  Return to Endocrinology clinic as below: No future appointments.

## 2023-06-07 ENCOUNTER — Other Ambulatory Visit (HOSPITAL_COMMUNITY): Payer: Self-pay

## 2023-06-07 DIAGNOSIS — I5022 Chronic systolic (congestive) heart failure: Secondary | ICD-10-CM

## 2023-06-07 MED ORDER — DIGOXIN 125 MCG PO TABS
125.0000 ug | ORAL_TABLET | Freq: Every day | ORAL | 0 refills | Status: AC
Start: 2023-06-07 — End: ?

## 2023-08-07 DIAGNOSIS — E119 Type 2 diabetes mellitus without complications: Secondary | ICD-10-CM | POA: Diagnosis not present

## 2023-08-15 ENCOUNTER — Telehealth: Payer: Self-pay

## 2023-08-15 ENCOUNTER — Other Ambulatory Visit: Payer: Self-pay | Admitting: Internal Medicine

## 2023-08-15 ENCOUNTER — Other Ambulatory Visit (HOSPITAL_COMMUNITY): Payer: Self-pay

## 2023-08-15 MED ORDER — DAPAGLIFLOZIN PROPANEDIOL 10 MG PO TABS: 10.0000 mg | ORAL_TABLET | Freq: Every day | ORAL | 5 refills | Status: DC

## 2023-08-15 NOTE — Telephone Encounter (Signed)
 Pharmacy Patient Advocate Encounter   Received notification from Pt Calls Messages that prior authorization for Farxiga  is required/requested.   Insurance verification completed.     Per test claim: The current 30 day co-pay is, $0.  No PA needed at this time. This test claim was processed through [facility]- copay amounts may vary at other pharmacies due to pharmacy/plan contracts, or as the patient moves through the different stages of their insurance plan.

## 2023-08-15 NOTE — Telephone Encounter (Signed)
Peter Russo needs PA

## 2023-08-18 ENCOUNTER — Ambulatory Visit: Payer: Self-pay | Admitting: General Practice

## 2023-08-18 DIAGNOSIS — E1122 Type 2 diabetes mellitus with diabetic chronic kidney disease: Secondary | ICD-10-CM | POA: Diagnosis not present

## 2023-08-18 DIAGNOSIS — E559 Vitamin D deficiency, unspecified: Secondary | ICD-10-CM | POA: Diagnosis not present

## 2023-08-18 DIAGNOSIS — N182 Chronic kidney disease, stage 2 (mild): Secondary | ICD-10-CM | POA: Diagnosis not present

## 2023-08-18 DIAGNOSIS — I129 Hypertensive chronic kidney disease with stage 1 through stage 4 chronic kidney disease, or unspecified chronic kidney disease: Secondary | ICD-10-CM | POA: Diagnosis not present

## 2023-08-18 DIAGNOSIS — E119 Type 2 diabetes mellitus without complications: Secondary | ICD-10-CM | POA: Diagnosis not present

## 2023-08-18 NOTE — Telephone Encounter (Signed)
  Chief Complaint: shortness of breath Symptoms: shortness of breath Frequency: started yesterday Pertinent Negatives: Patient denies fever, chest pain Disposition: [] ED /[x] Urgent Care (no appt availability in office) / [] Appointment(In office/virtual)/ []  [place] [facility]/ [] Home Care/ [] Refused Recommended Disposition /[] [place] [facility]/ []  Follow-up with PCP Additional Notes: patient endorses shortness of breath since yesterday. Pt with hx of CHF. Patient states the shortness of breath comes and goes but has been continuing since yesterday. Per protocol, recommendation is urgent care. Patient verbalized understanding of plan and all questions answered.    Copied from CRM [id number]. Topic: Clinical - Red Word Triage >> Aug 18, 2023  3:21 PM Nestora J wrote: [facility] that prompted transfer to Nurse Triage: Shortness of breath Reason for Disposition  [1] MILD difficulty breathing (e.g., minimal/no SOB at rest, SOB with walking, pulse <100) AND [2] NEW-onset or WORSE than normal  Answer Assessment - Initial Assessment Questions 1. RESPIRATORY STATUS: Describe your breathing? (e.g., wheezing, shortness of breath, unable to speak, severe coughing)      Shortness of breath 2. ONSET: When did this breathing problem begin?      Started yesterday 3. PATTERN Does the difficult breathing come and go, or has it been constant since it started?      Comes and go 4. SEVERITY: How bad is your breathing? (e.g., mild, moderate, severe)    - MILD: No SOB at rest, mild SOB with walking, speaks normally in sentences, can lie down, no retractions, pulse < 100.    - MODERATE: SOB at rest, SOB with minimal exertion and prefers to sit, cannot lie down flat, speaks in phrases, mild retractions, audible wheezing, pulse 100-120.    - SEVERE: Very SOB at rest, speaks in single words, struggling to breathe, sitting hunched forward, retractions, pulse > 120      Mild 5. RECURRENT SYMPTOM: Have you  had difficulty breathing before? If Yes, ask: When was the last time? and What happened that time?      No 6. CARDIAC HISTORY: Do you have any history of heart disease? (e.g., heart attack, angina, bypass surgery, angioplasty)      CHF 7. LUNG HISTORY: Do you have any history of lung disease?  (e.g., pulmonary embolus, asthma, emphysema)     No 8. CAUSE: What do you think is causing the breathing problem?      unsure 9. OTHER SYMPTOMS: Do you have any other symptoms? (e.g., dizziness, runny nose, cough, chest pain, fever)     No 10. O2 SATURATION MONITOR:  Do you use an oxygen saturation monitor (pulse oximeter) at home? If Yes, ask: What is your reading (oxygen level) today? What is your usual oxygen saturation reading? (e.g., 95%)       Not able  Protocols used: Breathing Difficulty-A-AH

## 2023-08-21 DIAGNOSIS — J069 Acute upper respiratory infection, unspecified: Secondary | ICD-10-CM | POA: Diagnosis not present

## 2023-08-21 DIAGNOSIS — R059 Cough, unspecified: Secondary | ICD-10-CM | POA: Diagnosis not present

## 2023-09-05 ENCOUNTER — Other Ambulatory Visit (HOSPITAL_COMMUNITY): Payer: Self-pay | Admitting: Family Medicine

## 2023-09-22 ENCOUNTER — Ambulatory Visit (INDEPENDENT_AMBULATORY_CARE_PROVIDER_SITE_OTHER)
Admission: RE | Admit: 2023-09-22 | Discharge: 2023-09-22 | Disposition: A | Discharge status: Home / Self Care | Payer: 59 | Source: Ambulatory Visit | Attending: Nurse Practitioner

## 2023-09-22 ENCOUNTER — Ambulatory Visit (INDEPENDENT_AMBULATORY_CARE_PROVIDER_SITE_OTHER): Payer: 59 | Admitting: Nurse Practitioner

## 2023-09-22 ENCOUNTER — Ambulatory Visit: Payer: Self-pay | Admitting: General Practice

## 2023-09-22 VITALS — BP 122/80 | HR 99 | Temp 102.7°F | Ht 67.0 in | Wt 238.4 lb

## 2023-09-22 DIAGNOSIS — R509 Fever, unspecified: Secondary | ICD-10-CM | POA: Insufficient documentation

## 2023-09-22 DIAGNOSIS — R0602 Shortness of breath: Secondary | ICD-10-CM

## 2023-09-22 DIAGNOSIS — R051 Acute cough: Secondary | ICD-10-CM

## 2023-09-22 DIAGNOSIS — I509 Heart failure, unspecified: Secondary | ICD-10-CM

## 2023-09-22 DIAGNOSIS — R059 Cough, unspecified: Secondary | ICD-10-CM | POA: Diagnosis not present

## 2023-09-22 DIAGNOSIS — J40 Bronchitis, not specified as acute or chronic: Secondary | ICD-10-CM | POA: Diagnosis not present

## 2023-09-22 LAB — POCT FLU A/B STATUS
Influenza A, POC: NEGATIVE
Influenza B, POC: NEGATIVE

## 2023-09-22 LAB — POC COVID19 BINAXNOW: SARS Coronavirus 2 Ag: NEGATIVE

## 2023-09-22 MED ORDER — BENZONATATE 200 MG PO CAPS: 200.0000 mg | ORAL_CAPSULE | Freq: Three times a day (TID) | ORAL | 0 refills | Status: DC | PRN

## 2023-09-22 MED ORDER — PREDNISONE 20 MG PO TABS: 40.0000 mg | ORAL_TABLET | Freq: Every day | ORAL | 0 refills | Status: AC

## 2023-09-22 NOTE — Telephone Encounter (Signed)
 Noted will evaluate in office

## 2023-09-22 NOTE — Assessment & Plan Note (Signed)
 Multifactorial.  Will put patient on prednisone  40 mg daily for 5 days.  Patient's A1c today was 6.6% did warn patient about increased glucose readings.  Patient's lungs clear to auscultation but increased work of breathing today pending stat chest x-ray.  Pending labs inclusive of CBC, BMP and BN P.  Strict signs and symptoms reviewed when to seek urgent and emergent health care.

## 2023-09-22 NOTE — Assessment & Plan Note (Signed)
 History of the same.  Patient is taking his diuretic and heart failure medicines as prescribed per his report.  He is having difficulty lying flat to breathe.  No lower extremity edema but increased work of breathing will check BNP today

## 2023-09-22 NOTE — Telephone Encounter (Signed)
 Copied from CRM [id number]. Topic: Clinical - Red Word Triage >> Sep 22, 2023  8:33 AM Leila C wrote: Red Word that prompted transfer to Nurse Triage: Patient's fiance Rona [phone] states patient is having chills, bad cough like whooping cough, headaches, shortness of breath, and wheezing. Patient does not have a fever, dizziness, vision issues or pain.  Chief Complaint: Coughing spells Symptoms: Cough, headache, chills Frequency: Yesterday Pertinent Negatives: Patient denies fever Disposition: [] ED /[] Urgent Care (no appt availability in office) / [x] Appointment(In office/virtual)/ []  [place] [facility]/ [] Home Care/ [] Refused Recommended Disposition /[]  Mobile Bus/ []  Follow-up with PCP Additional Notes: Spoke to patient's fiance, with permission, on behalf of the patient. Patient is experiencing dry coughing spells that started yesterday. Patient experiences SOB during the coughing spells. Patient is able to lay flat without experiencing SOB. According to fiance, patient is also experiencing chills and occasional wheezing. No congestion, chest pain, NV, or dizziness present. This RN advised patient to see a provider within 24 hours. Patient scheduled in office today with PCP. This RN advised patient/fiance to call back if symptoms worsen. Patient/fiance complied.   Reason for Disposition  SEVERE coughing spells (e.g., whooping sound after coughing, vomiting after coughing)  Answer Assessment - Initial Assessment Questions 1. ONSET: When did the cough begin?      Yesterday 2. SEVERITY: How bad is the cough today?      Frequent coughing spells 3. SPUTUM: Describe the color of your sputum (none, dry cough; clear, white, yellow, green)     Denies 4. HEMOPTYSIS: Are you coughing up any blood? If so ask: How much? (flecks, streaks, tablespoons, etc.)     Denies 5. DIFFICULTY BREATHING: Are you having difficulty breathing? If Yes, ask: How bad is it? (e.g.,  mild, moderate, severe)    - MILD: No SOB at rest, mild SOB with walking, speaks normally in sentences, can lie down, no retractions, pulse < 100.    - MODERATE: SOB at rest, SOB with minimal exertion and prefers to sit, cannot lie down flat, speaks in phrases, mild retractions, audible wheezing, pulse 100-120.    - SEVERE: Very SOB at rest, speaks in single words, struggling to breathe, sitting hunched forward, retractions, pulse > 120      SOB when coughing, states patient can lay down flat 6. FEVER: Do you have a fever? If Yes, ask: What is your temperature, how was it measured, and when did it start?     Denies 7. CARDIAC HISTORY: Do you have any history of heart disease? (e.g., heart attack, congestive heart failure)      Denies 8. LUNG HISTORY: Do you have any history of lung disease?  (e.g., pulmonary embolus, asthma, emphysema)     Denies 10. OTHER SYMPTOMS: Do you have any other symptoms? (e.g., runny nose, wheezing, chest pain)     Chills, wheezing, headache, denies congestion, denies chest pain, denies NV, denies dizziness  Protocols used: Cough - Acute Non-Productive-A-AH

## 2023-09-22 NOTE — Progress Notes (Signed)
 Acute Office Visit  Subjective:     Patient ID: Peter Russo, male    DOB: 12/11/1975, 48 y.o.   MRN: [id number]  Chief Complaint  Patient presents with   Cough    Pt complains of chills, fever, and cough that started yesterday. No SOB.      Patient is in today for sick symptoms  with a history of  CHF, CAD, CVA, HTN, GERD, DM2, with a history of smoking   Symptoms started yesterday Sicks contacts had a cough and has been sick for approx 1 week  Covid: utd Flu vaccine: not utd  He has tried cough syrup that has helped.   He is having labored breathing in office and mentions that he is more short of breath than normal. He has not been able to lay down to sleep he has been having to sit up. States that he generally can lay flat to sleep.   He was seen by a home health clinician today and his A1C was 6.6 No swelling per his report  He has had a 4 pound increase since sept 2024   Review of Systems  Constitutional:  Positive for chills, fever and malaise/fatigue.  HENT:  Negative for ear discharge, ear pain and sore throat.   Respiratory:  Positive for cough, shortness of breath and wheezing.   Cardiovascular:  Positive for chest pain.  Gastrointestinal:  Positive for vomiting. Negative for abdominal pain, diarrhea and nausea.  Musculoskeletal:  Negative for joint pain and myalgias.  Neurological:  Positive for headaches. Negative for dizziness.        Objective:    BP 122/80   Pulse 99   Temp (!) 102.7 F (39.3 C) (Oral)   Ht 5' 7 (1.702 m)   Wt 238 lb 6.4 oz (108.1 kg)   SpO2 98%   BMI 37.34 kg/m  BP Readings from Last 3 Encounters:  09/22/23 122/80  05/15/23 126/82  11/08/22 124/82   Wt Readings from Last 3 Encounters:  09/22/23 238 lb 6.4 oz (108.1 kg)  05/15/23 234 lb (106.1 kg)  11/08/22 244 lb (110.7 kg)   SpO2 Readings from Last 3 Encounters:  09/22/23 98%  05/15/23 99%  11/08/22 96%      Physical Exam Vitals and nursing note reviewed.   Constitutional:      Appearance: Normal appearance.  HENT:     Right Ear: Tympanic membrane, ear canal and external ear normal.     Left Ear: Tympanic membrane, ear canal and external ear normal.     Mouth/Throat:     Mouth: Mucous membranes are moist.     Pharynx: Oropharynx is clear.  Cardiovascular:     Rate and Rhythm: Normal rate and regular rhythm.     Heart sounds: Normal heart sounds.  Pulmonary:     Breath sounds: Normal breath sounds.     Comments: Increased work of breathing. Able to speak in complete sentences without taking a break. He is able to walkup and down the hall and no increased shortness of breath  Abdominal:     General: Bowel sounds are normal.  Musculoskeletal:     Right lower leg: No edema.     Left lower leg: No edema.  Lymphadenopathy:     Cervical: No cervical adenopathy.  Neurological:     Mental Status: He is alert.     No results found for any visits on 09/22/23.      Assessment & Plan:   Problem  List Items Addressed This Visit       Cardiovascular and Mediastinum   CHF (congestive heart failure) (HCC)   History of the same.  Patient is taking his diuretic and heart failure medicines as prescribed per his report.  He is having difficulty lying flat to breathe.  No lower extremity edema but increased work of breathing will check BNP today      Relevant Orders   DG Chest 2 View   Basic metabolic panel   Brain natriuretic peptide   CBC     Other   Acute cough - Primary   COVID and flu test in office.  Patient can use Tessalon  Perles 200 mg 3 times daily as needed along with over-the-counter cough medication at night to aid in sleep.      Relevant Medications   benzonatate  (TESSALON ) 200 MG capsule   Other Relevant Orders   DG Chest 2 View   POC COVID-19   POCT Flu A & B Status   Shortness of breath   Multifactorial.  Will put patient on prednisone  40 mg daily for 5 days.  Patient's A1c today was 6.6% did warn patient about  increased glucose readings.  Patient's lungs clear to auscultation but increased work of breathing today pending stat chest x-ray.  Pending labs inclusive of CBC, BMP and BN P.  Strict signs and symptoms reviewed when to seek urgent and emergent health care.      Relevant Medications   predniSONE  (DELTASONE ) 20 MG tablet   Other Relevant Orders   DG Chest 2 View   Fever and chills   Patient can use over-the-counter analgesic such as Tylenol  650 mg every 6 hours as needed.  Try to avoid NSAIDs given his history and being on Plavix .  Rest drink moderate amount of fluids.  Patient's urine needs to be a light yellow do not want patient to overload on fluids.  Last echocardiogram showed EF of less than 20%      Relevant Orders   POC COVID-19   POCT Flu A & B Status    Meds ordered this encounter  Medications   benzonatate  (TESSALON ) 200 MG capsule    Sig: Take 1 capsule (200 mg total) by mouth 3 (three) times daily as needed.    Dispense:  21 capsule    Refill:  0    Supervising Provider:   RANDEEN HARDY A [[id number]]   predniSONE  (DELTASONE ) 20 MG tablet    Sig: Take 2 tablets (40 mg total) by mouth daily with breakfast for 5 days.    Dispense:  10 tablet    Refill:  0    Supervising Provider:   RANDEEN HARDY A [[id number]]    Return if symptoms worsen or fail to improve.  Adina Crandall, NP

## 2023-09-22 NOTE — Assessment & Plan Note (Signed)
 COVID and flu test in office.  Patient can use Tessalon  Perles 200 mg 3 times daily as needed along with over-the-counter cough medication at night to aid in sleep.

## 2023-09-22 NOTE — Assessment & Plan Note (Signed)
 Patient can use over-the-counter analgesic such as Tylenol  650 mg every 6 hours as needed.  Try to avoid NSAIDs given his history and being on Plavix .  Rest drink moderate amount of fluids.  Patient's urine needs to be a light yellow do not want patient to overload on fluids.  Last echocardiogram showed EF of less than 20%

## 2023-09-22 NOTE — Patient Instructions (Addendum)
 Nice to see you today Flu and covid are negative I will be in touch with the xray once I have it  Follow up if you do not improve

## 2023-09-23 LAB — BASIC METABOLIC PANEL
BUN/Creatinine Ratio: 6 (calc) (ref 6–22)
BUN: 9 mg/dL (ref 7–25)
CO2: 24 mmol/L (ref 20–32)
Calcium: 9.1 mg/dL (ref 8.6–10.3)
Chloride: 103 mmol/L (ref 98–110)
Creat: 1.41 mg/dL — ABNORMAL HIGH (ref 0.60–1.29)
Glucose, Bld: 81 mg/dL (ref 65–99)
Potassium: 4 mmol/L (ref 3.5–5.3)
Sodium: 137 mmol/L (ref 135–146)

## 2023-09-23 LAB — CBC
HCT: 42.9 % (ref 38.5–50.0)
Hemoglobin: 14.3 g/dL (ref 13.2–17.1)
MCH: 29.9 pg (ref 27.0–33.0)
MCHC: 33.3 g/dL (ref 32.0–36.0)
MCV: 89.7 fL (ref 80.0–100.0)
MPV: 12.3 fL (ref 7.5–12.5)
Platelets: 140 10*3/uL (ref 140–400)
RBC: 4.78 10*6/uL (ref 4.20–5.80)
RDW: 13.3 % (ref 11.0–15.0)
WBC: 6.4 10*3/uL (ref 3.8–10.8)

## 2023-09-23 LAB — BRAIN NATRIURETIC PEPTIDE: Brain Natriuretic Peptide: 220 pg/mL — ABNORMAL HIGH (ref <100)

## 2023-09-25 ENCOUNTER — Telehealth: Payer: Self-pay | Admitting: Nurse Practitioner

## 2023-09-25 MED ORDER — DOXYCYCLINE HYCLATE 100 MG PO TABS: 100.0000 mg | ORAL_TABLET | Freq: Two times a day (BID) | ORAL | 0 refills | Status: AC

## 2023-09-25 NOTE — Telephone Encounter (Signed)
 Contacted pt.  Pt states he is feeling a lot better.  Pt states that he was able to pick up script.  No questions or concerns.

## 2023-09-25 NOTE — Telephone Encounter (Signed)
-----   Message from [facility] sent at 09/22/2023  5:02 PM EST ----- Regarding: cough Call and see how he is doing on Monday

## 2023-09-25 NOTE — Telephone Encounter (Signed)
 Can we let the patient know that the chest xray showed a possible atypical infection and that I am going to send in an antibiotic

## 2023-09-26 ENCOUNTER — Encounter: Payer: Self-pay | Admitting: Nurse Practitioner

## 2023-10-22 ENCOUNTER — Other Ambulatory Visit: Payer: Self-pay

## 2023-10-22 ENCOUNTER — Ambulatory Visit (HOSPITAL_COMMUNITY)
Admission: RE | Admit: 2023-10-22 | Discharge: 2023-10-22 | Disposition: A | Discharge status: Home / Self Care | Source: Ambulatory Visit | Attending: Physician Assistant | Admitting: Physician Assistant

## 2023-10-22 ENCOUNTER — Encounter (HOSPITAL_COMMUNITY): Payer: Self-pay

## 2023-10-22 VITALS — BP 123/85 | HR 82 | Temp 98.2°F | Resp 18 | Wt 232.0 lb

## 2023-10-22 DIAGNOSIS — M25572 Pain in left ankle and joints of left foot: Secondary | ICD-10-CM | POA: Diagnosis not present

## 2023-10-22 DIAGNOSIS — S93402A Sprain of unspecified ligament of left ankle, initial encounter: Secondary | ICD-10-CM

## 2023-10-22 NOTE — Discharge Instructions (Signed)
 At this time I suspect that you might have a mild sprain of one of the ligaments in your left ankle.  This essentially means that you have pulled the ligament past its typical range of motion.  It can take several weeks to recover fully from this.  We have supplied you with an ankle brace to help stabilizing your ankle.  I do recommend that you continue to walk on it and apply weight as this can help with rebuilding strength and prevent stiffness.  I have also included some stretches and rehab for you to follow to assist with recovery.  As needed you can take Tylenol and ibuprofen for pain management.  I also recommend applying warm compresses to the area for about 15 minutes on and at least 30 minutes off.  Make sure that you do not fall asleep with a warm compress on the area as this can lead to burns. If your symptoms seem like they are not improving or persisting past 2 to 3 weeks you can follow-up with orthopedics or your primary care provider for further management. If at any point you start to develop discoloration of your toes, difficulty moving your foot, severe swelling, numbness or tingling in the area, warmth or redness please go to the emergency room as these could be signs of a medical emergency.

## 2023-10-22 NOTE — ED Triage Notes (Signed)
 Reports 2 days was getting out of his car and "just stepped wrong" twisting left foot.  Points to left top of foot, medial area, just below ankle.  Patient has not taken any medications for pain
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
## 2023-10-24 ENCOUNTER — Encounter: Payer: Self-pay | Admitting: Nurse Practitioner

## 2023-11-16 ENCOUNTER — Ambulatory Visit: Payer: 59 | Admitting: Internal Medicine

## 2023-12-02 ENCOUNTER — Other Ambulatory Visit: Payer: Self-pay | Admitting: Internal Medicine

## 2023-12-02 DIAGNOSIS — E785 Hyperlipidemia, unspecified: Secondary | ICD-10-CM

## 2023-12-04 ENCOUNTER — Other Ambulatory Visit (HOSPITAL_COMMUNITY): Payer: Self-pay | Admitting: Family Medicine

## 2023-12-20 ENCOUNTER — Ambulatory Visit (HOSPITAL_COMMUNITY)
Admission: EM | Admit: 2023-12-20 | Discharge: 2023-12-20 | Disposition: A | Discharge status: Home / Self Care | Attending: Family Medicine | Admitting: Family Medicine

## 2023-12-20 ENCOUNTER — Ambulatory Visit (INDEPENDENT_AMBULATORY_CARE_PROVIDER_SITE_OTHER)

## 2023-12-20 ENCOUNTER — Encounter (HOSPITAL_COMMUNITY): Payer: Self-pay | Admitting: *Deleted

## 2023-12-20 DIAGNOSIS — M25572 Pain in left ankle and joints of left foot: Secondary | ICD-10-CM

## 2023-12-20 DIAGNOSIS — M7989 Other specified soft tissue disorders: Secondary | ICD-10-CM | POA: Diagnosis not present

## 2023-12-20 DIAGNOSIS — M19072 Primary osteoarthritis, left ankle and foot: Secondary | ICD-10-CM | POA: Diagnosis not present

## 2023-12-20 DIAGNOSIS — M7732 Calcaneal spur, left foot: Secondary | ICD-10-CM | POA: Diagnosis not present

## 2023-12-20 MED ORDER — DEXAMETHASONE SODIUM PHOSPHATE 10 MG/ML IJ SOLN
10.0000 mg | Freq: Once | INTRAMUSCULAR | Status: AC
  Administered 2023-12-20: 10 mg via INTRAMUSCULAR

## 2023-12-20 MED ORDER — DEXAMETHASONE SODIUM PHOSPHATE 10 MG/ML IJ SOLN
INTRAMUSCULAR | Status: AC
  Filled 2023-12-20: qty 1
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
## 2023-12-20 NOTE — Discharge Instructions (Signed)
 Official radiology interpretation showed mild degenerative changes, otherwise known as arthritis.  We have given you a steroid injection to help with pain and inflammation.  You can take 500 mg of Tylenol  every 6-8 hours for any further pain.  You can elevate the ankle and Ace wrap to provide compression.  If the pain persist please follow-up with an orthopedic for further evaluation.  If you develop any warmth, swelling or pain in other joints please follow-up with your primary care provider for potential uric acid/gout.   Return to clinic for any new or urgent symptoms.

## 2023-12-20 NOTE — ED Triage Notes (Addendum)
 Pt states he came about a month ago for his left ankle he wore the brace it got better. He states yesterday it started hurting and he soaked it in Epson salt and attempted to wear brace but states it made it worse.   He said he only came because his woman made him its not hardly hurting at all.

## 2023-12-25 ENCOUNTER — Other Ambulatory Visit (HOSPITAL_COMMUNITY): Payer: Self-pay | Admitting: Family Medicine

## 2023-12-29 ENCOUNTER — Ambulatory Visit

## 2024-01-16 ENCOUNTER — Ambulatory Visit (INDEPENDENT_AMBULATORY_CARE_PROVIDER_SITE_OTHER): Admitting: Internal Medicine

## 2024-01-16 ENCOUNTER — Encounter: Payer: Self-pay | Admitting: Internal Medicine

## 2024-01-16 VITALS — BP 126/80 | HR 79 | Ht 67.0 in | Wt 229.0 lb

## 2024-01-16 DIAGNOSIS — Z794 Long term (current) use of insulin: Secondary | ICD-10-CM

## 2024-01-16 DIAGNOSIS — E1159 Type 2 diabetes mellitus with other circulatory complications: Secondary | ICD-10-CM | POA: Diagnosis not present

## 2024-01-16 DIAGNOSIS — E1129 Type 2 diabetes mellitus with other diabetic kidney complication: Secondary | ICD-10-CM | POA: Diagnosis not present

## 2024-01-16 DIAGNOSIS — R809 Proteinuria, unspecified: Secondary | ICD-10-CM

## 2024-01-16 LAB — POCT GLYCOSYLATED HEMOGLOBIN (HGB A1C): Hemoglobin A1C: 5.5 % (ref 4.0–5.6)

## 2024-01-16 LAB — POCT GLUCOSE (DEVICE FOR HOME USE): Glucose Fasting, POC: 128 mg/dL — AB (ref 70–99)

## 2024-01-16 MED ORDER — SEMAGLUTIDE (1 MG/DOSE) 4 MG/3ML ~~LOC~~ SOPN: 1.0000 mg | PEN_INJECTOR | SUBCUTANEOUS | 3 refills | Status: DC

## 2024-01-16 NOTE — Progress Notes (Signed)
 Name: Peter Russo  Age/ Sex: 48 y.o., male   MRN/ DOB: [id number], 1976/08/08     PCP: Dorothe Gaster, NP   Reason for Endocrinology Evaluation: Type 2 Diabetes Mellitus  Initial Endocrine Consultative Visit: 09/23/2020    PATIENT IDENTIFIER: Peter Russo is a 48 y.o. male with a past medical history of T2Dm, CAD ( S/P PCI), CHF, and dyslipidemia The patient has followed with Endocrinology clinic since 09/23/2020 for consultative assistance with management of his diabetes.  DIABETIC HISTORY:  [name] was diagnosed with DM 02/2020. His hemoglobin A1c has ranged from 11.3% in7/2021, peaking at 12.2% in 2021.  ON his initial visit to our clinic he had an A1c of 6.8% , we switched Metformin  to XR , continued Lantus  and Farxiga     He was started on Ozempic  03/2021 by cardiology for weight loss in preparation  for heart transplant    Basal insulin  discontinued 04/2023 with an A1c of 5.6%   SUBJECTIVE:   During the last visit (05/15/2023): A1c 5.6 %    Today (01/16/2024): Mr. [name] is here for diabetes management.  He checks his blood sugars occasionally . The patient has not had hypoglycemic episodes since the last clinic visit.    Patient continues to follow-up with nephrology Ramona Burner) in January, 2025 He continues to follow-up with cardiology for CHF  He self held  Ozempic  2 mg due to extreme weight loss  Denies nausea or vomiting  Denies constipation but has loose stools and diarrhea    HOME DIABETES REGIMEN:  Metformin  500 mg XR , 1 tablet twice daily Farxiga  10 mg, 1 tablet with Breakfast  Ozempic  2 mg weekly    Statin: yes ACE-I/ARB: yes    METER DOWNLOAD SUMMARY: Did not bring     DIABETIC COMPLICATIONS: Microvascular complications:   Denies: CKD, retinopathy, neuropathy  Last Eye Exam: Completed 08/07/2023  Macrovascular complications:  CAD ( S/P PCI) , CHF , CVA 07/2022 Denies:  PVD   HISTORY:  Past Medical History:   Past Medical History:  Diagnosis Date   Anxiety    CAD S/P percutaneous coronary angioplasty    a. s/p BMS-mLAD 2010. b. DES to RCA 2012. c. 04/2015: DES to LCx and PCI to LAD c/b dissection with subsequent overlapping DES to mLAD - r/i for NSTEMI afterwards; d. 08/2015 Ant STEMI in setting of noncompliance->14mLAD ISR (3.0x16 Synergy DES); e. 03/2016 PCI of apical LAD; f. 03/2016 Relook Cath: patent LAD stents, RCA 100p CTO-->Med Rx; g. 10/2016 Ant STEMI: PTCA 100p LAD.   Chronic combined systolic and diastolic CHF (congestive heart failure) (HCC)    a. 03/2016 Echo: EF 30-35%, Gr2 DD;  b. 08/2016 Echo: EF 40%.   Depression    Essential hypertension    GAD (generalized anxiety disorder)    GERD (gastroesophageal reflux disease)    Hypercholesteremia    Ischemic cardiomyopathy    a. prior EF 25-35 percent at cath, 35-40 percent by echo; b. 03/2016 Echo: EF 30-35%, diff HK, Gr2 DD; c. 08/2016 Echo: EF 40%, base/mid inferior/inferowseptal AK, mildly dil LA.   Morbid obesity (HCC)    Pre-diabetes    SVT (supraventricular tachycardia) (HCC) APRROX 10 YRS AGO   PRESUMED AVNRT, ECHO 12/07 WITH EF 65%, MILD LAE   Tobacco abuse    a. 20 + pack years, quit 10/2016.   Past Surgical History:  Past Surgical History:  Procedure Laterality Date   CARDIAC CATHETERIZATION N/A 04/23/2015   Procedure: Left Heart Cath  and Coronary Angiography;  Surgeon: Loyde Rule, MD;  Location: [facility] INVASIVE CV LAB;  Service: Cardiovascular;  Laterality: N/A;   CARDIAC CATHETERIZATION N/A 04/23/2015   Procedure: Coronary Stent Intervention;  Surgeon: Arnoldo Lapping, MD;  Location: [facility] INVASIVE CV LAB;  Service: Cardiovascular;  Laterality: N/A;   CARDIAC CATHETERIZATION N/A 09/14/2015   Procedure: Left Heart Cath and Coronary Angiography;  Surgeon: Arty Binning, MD;  LAD 100%, CFX 10% ISR, RCA 100% (chronic), EF 25-35%   CARDIAC CATHETERIZATION N/A 09/14/2015   Procedure: Coronary Stent Intervention;  Surgeon: Arty Binning, MD;   Location: [facility] INVASIVE CV LAB;  Service: Cardiovascular;  Laterality: N/A; 3.0 x 16 mm Synergy DES LAD   CARDIAC CATHETERIZATION  03/16/2016   CARDIAC CATHETERIZATION N/A 03/16/2016   Procedure: Left Heart Cath and Coronary Angiography;  Surgeon: Arty Binning, MD;  Location: [facility] INVASIVE CV LAB;  Service: Cardiovascular;  Laterality: N/A;   CARDIAC CATHETERIZATION N/A 03/16/2016   Procedure: Coronary Balloon Angioplasty;  Surgeon: Arty Binning, MD;  Location: [facility] INVASIVE CV LAB;  Service: Cardiovascular;  Laterality: N/A;   CARDIAC CATHETERIZATION N/A 03/25/2016   Procedure: Left Heart Cath and Coronary Angiography;  Surgeon: Peter M Swaziland, MD;  Location: [facility] INVASIVE CV LAB;  Service: Cardiovascular;  Laterality: N/A;   CORONARY ANGIOPLASTY WITH STENT PLACEMENT  2010; 2013   CORONARY BALLOON ANGIOPLASTY N/A 10/19/2016   Procedure: Coronary Balloon Angioplasty;  Surgeon: Lucendia Rusk, MD;  Location: [facility] INVASIVE CV LAB;  Service: Cardiovascular;  Laterality: N/A;   CORONARY ULTRASOUND/IVUS N/A 10/19/2016   Procedure: Intravascular Ultrasound/IVUS;  Surgeon: Lucendia Rusk, MD;  Location: [facility] INVASIVE CV LAB;  Service: Cardiovascular;  Laterality: N/A;   LEFT HEART CATH AND CORONARY ANGIOGRAPHY N/A 10/19/2016   Procedure: Left Heart Cath and Coronary Angiography;  Surgeon: Lucendia Rusk, MD;  Location: [facility] INVASIVE CV LAB;  Service: Cardiovascular;  Laterality: N/A;   RIGHT/LEFT HEART CATH AND CORONARY ANGIOGRAPHY N/A 05/21/2020   Procedure: RIGHT/LEFT HEART CATH AND CORONARY ANGIOGRAPHY;  Surgeon: Darlis Eisenmenger, MD;  Location: [facility] INVASIVE CV LAB;  Service: Cardiovascular;  Laterality: N/A;   Social History:  reports that he has been smoking cigarettes. He started smoking about 31 years ago. He has a 24 pack-year smoking history. He has never used smokeless tobacco. He reports that he does not currently use drugs after having used the following drugs: Marijuana. He reports that he does not  drink alcohol. Family History:  Family History  Problem Relation Age of Onset   Arrhythmia Mother        MOTHER LIVED TO BE 38   Coronary artery disease Mother    Heart attack Mother    Hypertension Mother    Coronary artery disease Father 86   Heart disease Father    Heart attack Father    Heart attack Brother    Stroke Neg Hx      HOME MEDICATIONS: Allergies as of 01/16/2024   No Known Allergies      Medication List        Accurate as of January 16, 2024  8:19 AM. If you have any questions, ask your nurse or doctor.          acetaminophen  325 MG tablet Commonly known as: TYLENOL  Take 2 tablets (650 mg total) by mouth every 4 (four) hours as needed for headache or mild pain.   aspirin  EC 81 MG tablet Take 1 tablet (81 mg total) by mouth daily.  atorvastatin  80 MG tablet Commonly known as: LIPITOR  TAKE 1 TABLET (80 MG TOTAL) BY MOUTH DAILY. NEEDS FOLLOW UP APPOINTMENT FOR MORE REFILLS   benzonatate  200 MG capsule Commonly known as: TESSALON  Take 1 capsule (200 mg total) by mouth 3 (three) times daily as needed.   blood glucose meter kit and supplies Dispense based on patient and insurance preference. Use up to four times daily as directed. (FOR ICD-10 E10.9, E11.9).   buPROPion  150 MG 24 hr tablet Commonly known as: WELLBUTRIN  XL TAKE 1 TABLET BY MOUTH EVERY DAY   carvedilol  25 MG tablet Commonly known as: COREG  TAKE 1 TABLET (25 MG TOTAL) BY MOUTH 2 (TWO) TIMES DAILY WITH A MEAL. PLEASE CALL TO SCHEDULE FOLLOW UP   clopidogrel  75 MG tablet Commonly known as: PLAVIX  Take 1 tablet (75 mg total) by mouth daily.   clotrimazole -betamethasone  cream Commonly known as: LOTRISONE  Apply 1 application  topically daily as needed (rash).   dapagliflozin  propanediol 10 MG Tabs tablet Commonly known as: Farxiga  Take 1 tablet (10 mg total) by mouth daily before breakfast.   digoxin  0.125 MG tablet Commonly known as: LANOXIN  Take 1 tablet (125 mcg total) by mouth  daily.   Entresto  97-103 MG Generic drug: sacubitril -valsartan  Take 1 tablet by mouth 2 (two) times daily.   fenofibrate  160 MG tablet Take 1 tablet (160 mg total) by mouth daily.   furosemide  40 MG tablet Commonly known as: LASIX  Take 1 tablet (40 mg total) by mouth daily.   icosapent  Ethyl 1 g capsule Commonly known as: Vascepa  Take 2 capsules (2 g total) by mouth 2 (two) times daily.   metFORMIN  500 MG 24 hr tablet Commonly known as: GLUCOPHAGE -XR Take 1 tablet (500 mg total) by mouth 2 (two) times daily.   nitroGLYCERIN  0.4 MG SL tablet Commonly known as: NITROSTAT  Place 1 tablet (0.4 mg total) under the tongue every 5 (five) minutes as needed for chest pain.   Ozempic  (2 MG/DOSE) 8 MG/3ML Sopn Generic drug: Semaglutide  (2 MG/DOSE) Inject 2 mg into the skin once a week.   Pen Needles 3/16" 31G X 5 MM Misc 1 Device by Other route daily in the afternoon. Lantus  23   potassium chloride  SA 20 MEQ tablet Commonly known as: KLOR-CON  M Take 2 tablets (40 mEq total) by mouth daily.   Repatha  SureClick 140 MG/ML Soaj Generic drug: Evolocumab  INJECT 1 PEN INTO THE SKIN EVERY 14 (FOURTEEN) DAYS.   spironolactone  25 MG tablet Commonly known as: ALDACTONE  Take 1 tablet (25 mg total) by mouth daily.   triamcinolone  cream 0.1 % Commonly known as: KENALOG  Apply 1 application  topically daily as needed (itching).         OBJECTIVE:   Vital Signs: BP 126/80 (BP Location: Left Arm, Patient Position: Sitting, Cuff Size: Normal)   Pulse 79   Ht 5\' 7"  (1.702 m)   Wt 229 lb (103.9 kg)   SpO2 99%   BMI 35.87 kg/m   Wt Readings from Last 3 Encounters:  01/16/24 229 lb (103.9 kg)  10/22/23 232 lb (105.2 kg)  09/22/23 238 lb 6.4 oz (108.1 kg)     Exam: General: Pt appears well and is in NAD  Lungs: Clear with good BS bilat   Heart: RRR   Extremities: No pretibial edema.   Neuro: MS is good with appropriate affect, pt is alert and Ox3   DM FOOT Exam  :01/16/2024  The skin of the feet is without sores or ulcerations. The pedal pulses  are 1+ on right and 1+ on left. The sensation is intact to a screening 5.07, 10 gram monofilament bilaterally    DATA REVIEWED:  Lab Results  Component Value Date   HGBA1C 5.6 05/15/2023   HGBA1C 6.1 (A) 11/08/2022   HGBA1C 5.9 (H) 07/20/2022    Latest Reference Range & Units 09/22/23 16:32  Sodium 135 - 146 mmol/L 137  Potassium 3.5 - 5.3 mmol/L 4.0  Chloride 98 - 110 mmol/L 103  CO2 20 - 32 mmol/L 24  Glucose 65 - 99 mg/dL 81  BUN 7 - 25 mg/dL 9  Creatinine 1.61 - 0.96 mg/dL 0.45 (H)  Calcium  8.6 - 10.3 mg/dL 9.1  BUN/Creatinine Ratio 6 - 22 (calc) 6  (H): Data is abnormally high  ASSESSMENT / PLAN / RECOMMENDATIONS:   1) Type 2 Diabetes Mellitus, Optimally controlled, With microalbuminuria - Most recent A1c of 5.5 %. Goal A1c < 7.0 %.     - A1c remains optimal  -He has been off Lantus  with an A1c of 5.6% 2024 - Intolerant to higher doses of Metformin   - He has been holding Ozempic  2 mg due to extreme weight loss, we have discussed cardiovascular and renal benefits of Ozempic , and we opted to decrease the dose  MEDICATIONS: Continue Metformin  500 mg XR BID Continue Farxiga  10 mg daily  Decrease Ozempic  1 mg weekly    EDUCATION / INSTRUCTIONS: BG monitoring instructions: Patient is instructed to check his blood sugars 1 times a day, fasting. Call [facility] if: BG persistently < 70  I reviewed the Rule of 15 for the treatment of hypoglycemia in detail with the patient. Literature supplied.    2) Diabetic complications:  Eye: Does not have known diabetic retinopathy.  Neuro/ Feet: Does not have known diabetic peripheral neuropathy .  Renal: Patient does not have known baseline CKD. He   is on an ACEI/ARB at present.   3) Dyslipidemia:   - Per cardiology    4) Microalbuminuria:  - MA/CR ratio remains elevated -The patient is already on valsartan  -He  follows with nephrology    F/U in 6 months   Signed electronically by: Natale Bail, MD  [facility]  [facility] [address] [place] [place], [place] [address] Phone: [phone] FAX: [phone]   CC: Dorothe Gaster, NP [address] [place] [address] Phone: [phone]  Fax: [phone]  Return to Endocrinology clinic as below: Future Appointments  Date Time Provider Department Center  01/23/2024  8:30 AM [facility] PA/NP [facility] None  02/14/2024  3:40 PM [facility] ANNUAL WELLNESS VISIT 1 [facility]

## 2024-01-16 NOTE — Patient Instructions (Signed)
-   Continue Metformin  500 mg XR , 1 tablet twice daily  - Continue Farxiga  10 mg, 1 tablet with Breakfast  - Decrease Ozempic  1 mg weekly     HOW TO TREAT LOW BLOOD SUGARS (Blood sugar LESS THAN 70 MG/DL) Please follow the RULE OF 15 for the treatment of hypoglycemia treatment (when your (blood sugars are less than 70 mg/dL)   STEP 1: Take 15 grams of carbohydrates when your blood sugar is low, which includes:  3-4 GLUCOSE TABS  OR 3-4 OZ OF JUICE OR REGULAR SODA OR ONE TUBE OF GLUCOSE GEL    STEP 2: RECHECK blood sugar in 15 MINUTES STEP 3: If your blood sugar is still low at the 15 minute recheck --> then, go back to STEP 1 and treat AGAIN with another 15 grams of carbohydrates.

## 2024-01-22 ENCOUNTER — Telehealth (HOSPITAL_COMMUNITY): Payer: Self-pay

## 2024-01-22 NOTE — Telephone Encounter (Signed)
 Called to confirm/remind patient of their appointment at [facility] on 01/23/24.   Appointment:   [] Confirmed  [] Left mess   [x] No answer/No voice mail  [] VM Full/unable to leave message  [] Phone not in service

## 2024-01-23 ENCOUNTER — Ambulatory Visit (HOSPITAL_COMMUNITY): Payer: Self-pay | Admitting: Adult Health

## 2024-01-23 ENCOUNTER — Ambulatory Visit (HOSPITAL_COMMUNITY)
Admission: RE | Admit: 2024-01-23 | Discharge: 2024-01-23 | Disposition: A | Discharge status: Home / Self Care | Source: Ambulatory Visit | Attending: Adult Health | Admitting: Adult Health

## 2024-01-23 VITALS — BP 126/86 | HR 88 | Wt 231.6 lb

## 2024-01-23 DIAGNOSIS — I252 Old myocardial infarction: Secondary | ICD-10-CM | POA: Diagnosis not present

## 2024-01-23 DIAGNOSIS — E1151 Type 2 diabetes mellitus with diabetic peripheral angiopathy without gangrene: Secondary | ICD-10-CM | POA: Diagnosis not present

## 2024-01-23 DIAGNOSIS — Z7984 Long term (current) use of oral hypoglycemic drugs: Secondary | ICD-10-CM | POA: Diagnosis not present

## 2024-01-23 DIAGNOSIS — E669 Obesity, unspecified: Secondary | ICD-10-CM | POA: Diagnosis not present

## 2024-01-23 DIAGNOSIS — E785 Hyperlipidemia, unspecified: Secondary | ICD-10-CM | POA: Diagnosis not present

## 2024-01-23 DIAGNOSIS — Z955 Presence of coronary angioplasty implant and graft: Secondary | ICD-10-CM | POA: Diagnosis not present

## 2024-01-23 DIAGNOSIS — G4733 Obstructive sleep apnea (adult) (pediatric): Secondary | ICD-10-CM | POA: Diagnosis not present

## 2024-01-23 DIAGNOSIS — I2510 Atherosclerotic heart disease of native coronary artery without angina pectoris: Secondary | ICD-10-CM | POA: Diagnosis not present

## 2024-01-23 DIAGNOSIS — F17210 Nicotine dependence, cigarettes, uncomplicated: Secondary | ICD-10-CM | POA: Insufficient documentation

## 2024-01-23 DIAGNOSIS — Z794 Long term (current) use of insulin: Secondary | ICD-10-CM | POA: Diagnosis not present

## 2024-01-23 DIAGNOSIS — Z720 Tobacco use: Secondary | ICD-10-CM | POA: Diagnosis not present

## 2024-01-23 DIAGNOSIS — I6523 Occlusion and stenosis of bilateral carotid arteries: Secondary | ICD-10-CM | POA: Diagnosis not present

## 2024-01-23 DIAGNOSIS — E118 Type 2 diabetes mellitus with unspecified complications: Secondary | ICD-10-CM | POA: Diagnosis not present

## 2024-01-23 DIAGNOSIS — I255 Ischemic cardiomyopathy: Secondary | ICD-10-CM | POA: Diagnosis not present

## 2024-01-23 DIAGNOSIS — I5022 Chronic systolic (congestive) heart failure: Secondary | ICD-10-CM | POA: Insufficient documentation

## 2024-01-23 DIAGNOSIS — I110 Hypertensive heart disease with heart failure: Secondary | ICD-10-CM | POA: Diagnosis not present

## 2024-01-23 DIAGNOSIS — Z6836 Body mass index (BMI) 36.0-36.9, adult: Secondary | ICD-10-CM | POA: Insufficient documentation

## 2024-01-23 DIAGNOSIS — Z7902 Long term (current) use of antithrombotics/antiplatelets: Secondary | ICD-10-CM | POA: Insufficient documentation

## 2024-01-23 DIAGNOSIS — I10 Essential (primary) hypertension: Secondary | ICD-10-CM | POA: Diagnosis present

## 2024-01-23 LAB — BASIC METABOLIC PANEL WITH GFR
Anion gap: 9 (ref 5–15)
BUN: 13 mg/dL (ref 6–20)
CO2: 25 mmol/L (ref 22–32)
Calcium: 9.2 mg/dL (ref 8.9–10.3)
Chloride: 107 mmol/L (ref 98–111)
Creatinine, Ser: 1.22 mg/dL (ref 0.61–1.24)
GFR, Estimated: >60 mL/min (ref >60)
Glucose, Bld: 108 mg/dL — ABNORMAL HIGH (ref 70–99)
Potassium: 3.9 mmol/L (ref 3.5–5.1)
Sodium: 141 mmol/L (ref 135–145)

## 2024-01-23 NOTE — Addendum Note (Signed)
 Encounter addended by: Thanh Pomerleau M, RN on: 01/23/2024 9:23 AM  Actions taken: Charge Capture section accepted

## 2024-01-23 NOTE — Progress Notes (Signed)
 PCP: Dorothe Gaster, NP Cardiology: Dr. Stann Earnest HF Cardiology: Dr. Mitzie Anda  48 y.o. with history of CAD s/p multiple MIs and PCIs, ischemic cardiomyopathy, HTN, active smoking, and diabetes.  Patient has had multiple coronary interventions, fist in 2010.  His RCA is now chronically occluded, and he has had many stents in the LAD.  Echo in 1/18 showed EF 40%.  EF was down to 20-25% in 12/19.  Echo in 7/21 was reported as EF 25-30%, moderate LV enlargement, normal RV.  Of note, patient seen in urgent care in 7/21 and noted to have blood glucose > 400.  HgbA1c was 11.3. He was started on metformin  at urgent care with new diagnosis of diabetes.     Given more frequent chest pain and dyspnea with moderate exertion, I took him for LHC/RHC in 10/21.  This showed low output HF with CI 1.48.  His LAD was totally occluded (long segment) with no collaterals, the RCA was totally occluded with R-R collaterals, and the LCx was patent.  He was admitted and started on milrinone , diuresed.  He was weaned off milrinone .  Lifevest was recommended but he refused.  He was seen by cardiac surgery, not a candidate for CABG.  We began LVAD evaluation (active smoker, not transplant candidate).  Echo in 10/21 showed EF 20-25% with severe LV dilation.  CPX (10/21) showed peak VO2 16, VE/VCO2 slope 37, RER 1.13 => moderate HF limitation.   Echo in 8/22 showed EF 20-25%, moderate LV dilation, mildly decreased RV systolic function. CPX in 7/23 showed moderate-severe functional limitation, somewhat worse than 10/21.   Today he returns for HF follow up. No HF hospitalization over the last 2 years. He has not been seen in in the HF clinic in  2 years. Overall feeling fine.  Has episodes with anxiety. Denies SOB/PND/Orthopnea. Able to walk up steps but just takes his time. No chest pain. Appetite poor with GLP1.  Using CPAP 3 times a week. No fever or chills. Weight at home 229-230 pounds.  Taking all medications. Smoking cigarettes and  marijuana.   PMH: 1. CAD:  - BMS to LAD in 2010.  - DES to RCA on 2012 - DES to LCx and LAD in 9/16, complicated by dissection with overlapping DES to mid LAD.  - Anterior STEMI in 1/17 with DES to totally occluded LAD (stent thrombosis).  - 8/17 PCI to apical LAD, RCA noted to be totally occluded with collaterals.  - 3/18 anterior STEMI with PTCA to totally occluded LAD.  - Coronary angiography (10/21): LAD was totally occluded (long segment) with no collaterals, the RCA was totally occluded with R-R collaterals, and the LCx was patent 2. Chronic systolic CHF: Ischemic cardiomyopathy.   - Echo (1/18): EF 40%.  - Echo (12/19): EF 20-25% - Echo (7/21): EF 25-30%, moderate LV enlargement, mild LVH, RV normal.  - RHC (10/21): mean RA 6, PA 47/13, mean PCWP 20, CI 1.48, PVR 2.4 WU - CPX (10/21): peak VO2 16, VE/VCO2 slope 37, RER 1.13 => moderate HF limitation - Echo (8/22): EF 20-25%, moderate LV dilation, mildly decreased RV systolic function - CPX (7/23): VO2 max 13.7, VE/VOC2 slope 41, RER 1.10 => moderate to severe limitation due to body habitus and CHF.  3. HTN 4. H/o AVNRT 5. Smoking: Active smoker, 1-2 cigs/day.  - PFTs in 10/21 with minimal obstructive disease.  6. OSA: Severe 7. Type 2 diabetes: Diagnosed in 7/21 8. Carotid dopplers (10/21): 1-39% BICA stenosis.  9. PAD: ABIs (  10/21) were mildly decreased.  10. Hyperlipidemia  Social History   Socioeconomic History   Marital status: Significant Other    Spouse name: Glory Larsen   Number of children: 2   Years of education: Not on file   Highest education level: Not on file  Occupational History   Occupation: Agricultural engineer: [employer]  Tobacco Use   Smoking status: Every Day    Current packs/day: 0.00    Average packs/day: 1 pack/day for 24.0 years (24.0 ttl pk-yrs)    Types: Cigarettes    Start date: 11/03/1992    Last attempt to quit: 11/03/2016    Years since quitting: 7.2   Smokeless tobacco: Never    Tobacco comments:    quit on 03/15/16  Vaping Use   Vaping status: Never Used  Substance and Sexual Activity   Alcohol use: No   Drug use: Not Currently    Types: Marijuana    Comment: last time 02/2016   Sexual activity: Yes  Other Topics Concern   Not on file  Social History Narrative   Diability      Sakiya (20)   Khmari (17)      Step daughter: Duke Gibbons (30)      Hobbies: fish, drag race   Social Drivers of Corporate investment banker Strain: Not on file  Food Insecurity: No Food Insecurity (07/20/2022)   Hunger Vital Sign    Worried About Running Out of Food in the Last Year: Never true    Ran Out of Food in the Last Year: Never true  Transportation Needs: No Transportation Needs (07/20/2022)   PRAPARE - Administrator, Civil Service (Medical): No    Lack of Transportation (Non-Medical): No  Physical Activity: Not on file  Stress: Not on file  Social Connections: Not on file  Intimate Partner Violence: Not At Risk (07/20/2022)   Humiliation, Afraid, Rape, and Kick questionnaire    Fear of Current or Ex-Partner: No    Emotionally Abused: No    Physically Abused: No    Sexually Abused: No   Family History  Problem Relation Age of Onset   Arrhythmia Mother        MOTHER LIVED TO BE 82   Coronary artery disease Mother    Heart attack Mother    Hypertension Mother    Coronary artery disease Father 21   Heart disease Father    Heart attack Father    Heart attack Brother    Stroke Neg Hx    ROS: All systems reviewed and negative except as per HPI.   Current Outpatient Medications  Medication Sig Dispense Refill   acetaminophen  (TYLENOL ) 325 MG tablet Take 2 tablets (650 mg total) by mouth every 4 (four) hours as needed for headache or mild pain.     aspirin  81 MG EC tablet Take 1 tablet (81 mg total) by mouth daily. 90 tablet 3   atorvastatin  (LIPITOR ) 80 MG tablet TAKE 1 TABLET (80 MG TOTAL) BY MOUTH DAILY. NEEDS FOLLOW UP APPOINTMENT FOR MORE REFILLS 90  tablet 0   benzonatate  (TESSALON ) 200 MG capsule Take 1 capsule (200 mg total) by mouth 3 (three) times daily as needed. 21 capsule 0   blood glucose meter kit and supplies Dispense based on patient and insurance preference. Use up to four times daily as directed. (FOR ICD-10 E10.9, E11.9). 1 each 0   buPROPion  (WELLBUTRIN  XL) 150 MG 24 hr tablet TAKE 1 TABLET BY MOUTH  EVERY DAY 90 tablet 3   carvedilol  (COREG ) 25 MG tablet TAKE 1 TABLET (25 MG TOTAL) BY MOUTH 2 (TWO) TIMES DAILY WITH A MEAL. PLEASE CALL TO SCHEDULE FOLLOW UP 180 tablet 0   clopidogrel  (PLAVIX ) 75 MG tablet Take 1 tablet (75 mg total) by mouth daily. 90 tablet 1   clotrimazole -betamethasone  (LOTRISONE ) cream Apply 1 application  topically daily as needed (rash).     dapagliflozin  propanediol (FARXIGA ) 10 MG TABS tablet Take 1 tablet (10 mg total) by mouth daily before breakfast. 30 tablet 5   digoxin  (LANOXIN ) 0.125 MG tablet Take 1 tablet (125 mcg total) by mouth daily. 30 tablet 0   Evolocumab  (REPATHA  SURECLICK) 140 MG/ML SOAJ INJECT 1 PEN INTO THE SKIN EVERY 14 (FOURTEEN) DAYS. 6 mL 3   fenofibrate  160 MG tablet Take 1 tablet (160 mg total) by mouth daily. 90 tablet 3   furosemide  (LASIX ) 40 MG tablet Take 1 tablet (40 mg total) by mouth daily. 30 tablet 6   icosapent  Ethyl (VASCEPA ) 1 g capsule Take 2 capsules (2 g total) by mouth 2 (two) times daily. 120 capsule 11   metFORMIN  (GLUCOPHAGE -XR) 500 MG 24 hr tablet Take 1 tablet (500 mg total) by mouth 2 (two) times daily. 180 tablet 3   nitroGLYCERIN  (NITROSTAT ) 0.4 MG SL tablet Place 1 tablet (0.4 mg total) under the tongue every 5 (five) minutes as needed for chest pain. 25 tablet 3   potassium chloride  SA (KLOR-CON  M) 20 MEQ tablet Take 2 tablets (40 mEq total) by mouth daily. 180 tablet 3   sacubitril -valsartan  (ENTRESTO ) 97-103 MG Take 1 tablet by mouth 2 (two) times daily. 60 tablet 11   Semaglutide , 1 MG/DOSE, 4 MG/3ML SOPN Inject 1 mg as directed once a week. (Patient  taking differently: Inject 1 mg as directed once a week. Takes on Sundays) 9 mL 3   spironolactone  (ALDACTONE ) 25 MG tablet Take 1 tablet (25 mg total) by mouth daily. 90 tablet 3   No current facility-administered medications for this encounter.   BP 126/86   Pulse 88   Wt 105.1 kg (231 lb 9.6 oz)   SpO2 99%   BMI 36.27 kg/m  Wt Readings from Last 3 Encounters:  01/23/24 105.1 kg (231 lb 9.6 oz)  01/16/24 103.9 kg (229 lb)  10/22/23 105.2 kg (232 lb)    General:   No resp difficulty Neck: supple. no JVD.  Cor: PMI nondisplaced. Regular rate & rhythm. No rubs, gallops or murmurs. Lungs: clear Abdomen: soft, nontender, nondistended.  Extremities: no cyanosis, clubbing, rash, edema Neuro: alert & oriented x3  EKG - SR 78 bpm personally reviewed.   Assessment/Plan: 1. CAD: RCA known to be chronically occluded.  He has had multiple PCIs to LAD.  Repeat LHC in 10/21 done for recurrent angina demonstrated occluded RCA (known from past) and occluded pLAD with no collaterals. His LCx was patent. I do not think we have a revascularization option, multiple overlapping LAD stents, now occluded and there are no good surgical targets.   No chest pain.  - Continue ASA 81  - Continue Atorvastatin  80  2. Chronic systolic CHF: Ischemic cardiomyopathy.  Echo in 10/21 with EF 20-25%, severe LV dilation, mildly decreased RV systolic function.  RHC in 10/21 demonstrated elevated PCWP, mild pulmonary venous hypertension and low cardiac output with CI 1.4. Patient was transiently on milrinone  but weaned off. CPX in 10/21 showed peak VO2 16, VE/VCO2 slope 37, RER 1.13 => moderate HF limitation.  Echo in 8/22 showed EF 20-25%.  CPX was worse in 7/23 with moderate-severe functional limitation.   Echo 2023 EF < 20%.   NYHA II. Functionally doing great.  -Appears euvolemic. Continue lasix  40 mg daily  - Continue Entresto  97/103 bid.  -Continue coreg  25 mg twice a day.   - Continue spironolactone  25 daily   - Continue digoxin  0.125 daily - Continue Farxiga  10 mg daily.    - He has had LVAD workup in the past.  He had low CO on RHC and in 7/23 had CPX showing moderate-severe functional limitation. - He is not a CRT candidate. He has seen Dr. Rodolfo Clan regarding ICD he wants to hold off.  Repeat Echo next visit.  - Consider repeating CPX next visit.  3. Type 2 diabetes: Better controlled. Hgb A1C 5.5!!  - Continue Farxiga  10 mg daily.  4. OSA: Severe OSA, uses CPAP.  Continue  5. Smoking:   PFTs with minimal obstructive disease.  - Continue Wellbutrin . Discussed smoking cessation.  6. Hyperlipidemia: LDL is at goal on Repatha  and atorvastatin .  TGs high when last checked, now on Vascepa  and fenofibrate .  Followed by lipid clinic. Good lipids in 5/23. 7. Obesity: Body mass index is 36.27 kg/m. Has had a good response to GLP1.   Check BMET   Follow up in 3 months with an Echo and Dr Mitzie Anda.      Lexi Conaty NP-C  01/23/2024

## 2024-01-23 NOTE — Patient Instructions (Addendum)
 Good to see you today!   Labs done today, your results will be available in MyChart, we will contact you for abnormal readings.  Your physician has requested that you have an echocardiogram. Echocardiography is a painless test that uses sound waves to create images of your heart. It provides your doctor with information about the size and shape of your heart and how well your heart's chambers and valves are working. This procedure takes approximately one hour. There are no restrictions for this procedure. Please do NOT wear cologne, perfume, aftershave, or lotions (deodorant is allowed). Please arrive 15 minutes prior to your appointment time.  Please note: We ask at that you not bring children with you during ultrasound (echo/ vascular) testing. Due to room size and safety concerns, children are not allowed in the ultrasound rooms during exams. Our front office staff cannot provide observation of children in our lobby area while testing is being conducted. An adult accompanying a patient to their appointment will only be allowed in the ultrasound room at the discretion of the ultrasound technician under special circumstances. We apologize for any inconvenience.  Your physician recommends that you schedule a follow-up appointment:3 months with echocardiogram(September) Call office in July to schedule an appointment  At the [facility], you and your health needs are our priority. As part of our continuing mission to provide you with exceptional heart care, we have created designated Provider Care Teams. These Care Teams include your primary Cardiologist (physician) and Advanced Practice Providers (APPs- Physician Assistants and Nurse Practitioners) who all work together to provide you with the care you need, when you need it.   You may see any of the following providers on your designated Care Team at your next follow up: Dr Jules Oar Dr Peder Bourdon Dr. Alwin Baars Dr. Arta Lark Amy Marijane Shoulders, NP Ruddy Corral, [place] [facility] [place], [place] Dennise Fitz, NP [place] Lee, NP Shawnee Dellen, NP Luster Salters, PharmD Bevely Brush, PharmD   Please be sure to bring in all your medications bottles to every appointment.    Thank you for choosing [facility]

## 2024-02-06 ENCOUNTER — Other Ambulatory Visit: Payer: Self-pay | Admitting: Internal Medicine

## 2024-02-06 ENCOUNTER — Other Ambulatory Visit (HOSPITAL_COMMUNITY): Payer: Self-pay | Admitting: Internal Medicine

## 2024-02-06 DIAGNOSIS — E785 Hyperlipidemia, unspecified: Secondary | ICD-10-CM

## 2024-02-09 ENCOUNTER — Other Ambulatory Visit: Payer: Self-pay | Admitting: Internal Medicine

## 2024-02-09 DIAGNOSIS — E785 Hyperlipidemia, unspecified: Secondary | ICD-10-CM

## 2024-02-14 ENCOUNTER — Ambulatory Visit

## 2024-02-14 VITALS — BP 110/76 | Ht 67.0 in | Wt 229.8 lb

## 2024-02-14 DIAGNOSIS — Z0000 Encounter for general adult medical examination without abnormal findings: Secondary | ICD-10-CM | POA: Diagnosis not present

## 2024-02-14 NOTE — Patient Instructions (Signed)
 Peter Russo , Thank you for taking time out of your busy schedule to complete your Annual Wellness Visit with me. I enjoyed our conversation and look forward to speaking with you again next year. I, as well as your care team,  appreciate your ongoing commitment to your health goals. Please review the following plan we discussed and let me know if I can assist you in the future. Your Game plan/ To Do List    Follow up Visits: Next Medicare AWV with our clinical staff: 04/09/25 @ 3:40pm in person   Have you seen your provider in the last 6 months (3 months if uncontrolled diabetes)? No Next Office Visit with your provider: 07/18/24 @ 3:25pm  Clinician Recommendations:  Aim for 30 minutes of exercise or brisk walking, 6-8 glasses of water, and 5 servings of fruits and vegetables each day.       This is a list of the screening recommended for you and due dates:  Health Maintenance  Topic Date Due   DTaP/Tdap/Td vaccine (1 - Tdap) Never done   Pneumococcal Vaccination (1 of 2 - PCV) Never done   Hepatitis B Vaccine (1 of 3 - 19+ 3-dose series) Never done   Colon Cancer Screening  Never done   Flu Shot  03/15/2024   Hemoglobin A1C  07/17/2024   Eye exam for diabetics  08/06/2024   Complete foot exam   01/15/2025   Hepatitis C Screening  Completed   HIV Screening  Completed   HPV Vaccine  Aged Out   Meningitis B Vaccine  Aged Out   COVID-19 Vaccine  Discontinued    Advanced directives: (Declined) Advance directive discussed with you today. Even though you declined this today, please call our office should you change your mind, and we can give you the proper paperwork for you to fill out. Advance Care Planning is important because it:  [x]  Makes sure you receive the medical care that is consistent with your values, goals, and preferences  [x]  It provides guidance to your family and loved ones and reduces their decisional burden about whether or not they are making the right decisions  based on your wishes.  Follow the link provided in your after visit summary or read over the paperwork we have mailed to you to help you started getting your Advance Directives in place. If you need assistance in completing these, please reach out to us  so that we can help you!

## 2024-02-14 NOTE — Progress Notes (Cosign Needed Addendum)
 Subjective:   Peter Russo is a 48 y.o. who presents for a Medicare Wellness preventive visit.  As a reminder, Annual Wellness Visits don't include a physical exam, and some assessments may be limited, especially if this visit is performed virtually. We may recommend an in-person follow-up visit with your provider if needed.  Visit Complete: In person  Persons Participating in Visit: Patient.  AWV Questionnaire: No: Patient Medicare AWV questionnaire was not completed prior to this visit.  Cardiac Risk Factors include: advanced age (>59men, >97 women);diabetes mellitus;dyslipidemia;hypertension;male gender;obesity (BMI >30kg/m2);smoking/ tobacco exposure     Objective:    Today's Vitals   02/14/24 1555  BP: 110/76  Weight: 229 lb 12.8 oz (104.2 kg)  Height: 5' 7 (1.702 m)   Body mass index is 35.99 kg/m.     02/14/2024    4:28 PM 07/20/2022    3:54 AM 07/19/2022   11:43 PM 05/21/2020    5:40 AM 02/12/2020    3:57 PM 01/11/2020   11:44 PM 01/07/2020   11:11 PM  Advanced Directives  Does Patient Have a Medical Advance Directive? No No No No No No No  Would patient like information on creating a medical advance directive?  No - Patient declined No - Patient declined No - Patient declined  No - Patient declined No - Patient declined    Current Medications (verified) Outpatient Encounter Medications as of 02/14/2024  Medication Sig   acetaminophen  (TYLENOL ) 325 MG tablet Take 2 tablets (650 mg total) by mouth every 4 (four) hours as needed for headache or mild pain.   aspirin  81 MG EC tablet Take 1 tablet (81 mg total) by mouth daily.   atorvastatin  (LIPITOR ) 80 MG tablet TAKE 1 TABLET (80 MG TOTAL) BY MOUTH DAILY. NEEDS FOLLOW UP APPOINTMENT FOR MORE REFILLS   benzonatate  (TESSALON ) 200 MG capsule Take 1 capsule (200 mg total) by mouth 3 (three) times daily as needed.   blood glucose meter kit and supplies Dispense based on patient and insurance preference. Use up to four  times daily as directed. (FOR ICD-10 E10.9, E11.9).   buPROPion  (WELLBUTRIN  XL) 150 MG 24 hr tablet TAKE 1 TABLET BY MOUTH EVERY DAY   carvedilol  (COREG ) 25 MG tablet TAKE 1 TABLET (25 MG TOTAL) 2 (TWO) TIMES DAILY WITH A MEAL. PLEASE CALL TO SCHEDULE FOLLOW UP   clopidogrel  (PLAVIX ) 75 MG tablet Take 1 tablet (75 mg total) by mouth daily.   clotrimazole -betamethasone  (LOTRISONE ) cream Apply 1 application  topically daily as needed (rash).   dapagliflozin  propanediol (FARXIGA ) 10 MG TABS tablet Take 1 tablet (10 mg total) by mouth daily before breakfast.   digoxin  (LANOXIN ) 0.125 MG tablet Take 1 tablet (125 mcg total) by mouth daily.   Evolocumab  (REPATHA  SURECLICK) 140 MG/ML SOAJ INJECT 1 PEN INTO THE SKIN EVERY 14 (FOURTEEN) DAYS.   fenofibrate  160 MG tablet Take 1 tablet (160 mg total) by mouth daily.   furosemide  (LASIX ) 40 MG tablet Take 1 tablet (40 mg total) by mouth daily.   icosapent  Ethyl (VASCEPA ) 1 g capsule Take 2 capsules (2 g total) by mouth 2 (two) times daily.   metFORMIN  (GLUCOPHAGE -XR) 500 MG 24 hr tablet Take 1 tablet (500 mg total) by mouth 2 (two) times daily.   nitroGLYCERIN  (NITROSTAT ) 0.4 MG SL tablet Place 1 tablet (0.4 mg total) under the tongue every 5 (five) minutes as needed for chest pain.   potassium chloride  SA (KLOR-CON  M) 20 MEQ tablet Take 2 tablets (40 mEq  total) by mouth daily.   sacubitril -valsartan  (ENTRESTO ) 97-103 MG Take 1 tablet by mouth 2 (two) times daily.   Semaglutide , 1 MG/DOSE, 4 MG/3ML SOPN Inject 1 mg as directed once a week.   spironolactone  (ALDACTONE ) 25 MG tablet Take 1 tablet (25 mg total) by mouth daily.   No facility-administered encounter medications on file as of 02/14/2024.    Allergies (verified) Patient has no known allergies.   History: Past Medical History:  Diagnosis Date   Anxiety    CAD S/P percutaneous coronary angioplasty    a. s/p BMS-mLAD 2010. b. DES to RCA 2012. c. 04/2015: DES to LCx and PCI to LAD c/b dissection  with subsequent overlapping DES to mLAD - r/i for NSTEMI afterwards; d. 08/2015 Ant STEMI in setting of noncompliance->163mLAD ISR (3.0x16 Synergy DES); e. 03/2016 PCI of apical LAD; f. 03/2016 Relook Cath: patent LAD stents, RCA 100p CTO-->Med Rx; g. 10/2016 Ant STEMI: PTCA 100p LAD.   Chronic combined systolic and diastolic CHF (congestive heart failure) (HCC)    a. 03/2016 Echo: EF 30-35%, Gr2 DD;  b. 08/2016 Echo: EF 40%.   Depression    Essential hypertension    GAD (generalized anxiety disorder)    GERD (gastroesophageal reflux disease)    Hypercholesteremia    Ischemic cardiomyopathy    a. prior EF 25-35 percent at cath, 35-40 percent by echo; b. 03/2016 Echo: EF 30-35%, diff HK, Gr2 DD; c. 08/2016 Echo: EF 40%, base/mid inferior/inferowseptal AK, mildly dil LA.   Morbid obesity (HCC)    Pre-diabetes    SVT (supraventricular tachycardia) (HCC) APRROX 10 YRS AGO   PRESUMED AVNRT, ECHO 12/07 WITH EF 65%, MILD LAE   Tobacco abuse    a. 20 + pack years, quit 10/2016.   Past Surgical History:  Procedure Laterality Date   CARDIAC CATHETERIZATION N/A 04/23/2015   Procedure: Left Heart Cath and Coronary Angiography;  Surgeon: Maude JAYSON Emmer, MD;  Location: [facility] INVASIVE CV LAB;  Service: Cardiovascular;  Laterality: N/A;   CARDIAC CATHETERIZATION N/A 04/23/2015   Procedure: Coronary Stent Intervention;  Surgeon: Ozell Fell, MD;  Location: [facility] INVASIVE CV LAB;  Service: Cardiovascular;  Laterality: N/A;   CARDIAC CATHETERIZATION N/A 09/14/2015   Procedure: Left Heart Cath and Coronary Angiography;  Surgeon: Victory LELON Sharps, MD;  LAD 100%, CFX 10% ISR, RCA 100% (chronic), EF 25-35%   CARDIAC CATHETERIZATION N/A 09/14/2015   Procedure: Coronary Stent Intervention;  Surgeon: Victory LELON Sharps, MD;  Location: [facility] INVASIVE CV LAB;  Service: Cardiovascular;  Laterality: N/A; 3.0 x 16 mm Synergy DES LAD   CARDIAC CATHETERIZATION  03/16/2016   CARDIAC CATHETERIZATION N/A 03/16/2016   Procedure: Left Heart Cath and  Coronary Angiography;  Surgeon: Victory LELON Sharps, MD;  Location: [facility] INVASIVE CV LAB;  Service: Cardiovascular;  Laterality: N/A;   CARDIAC CATHETERIZATION N/A 03/16/2016   Procedure: Coronary Balloon Angioplasty;  Surgeon: Victory LELON Sharps, MD;  Location: [facility] INVASIVE CV LAB;  Service: Cardiovascular;  Laterality: N/A;   CARDIAC CATHETERIZATION N/A 03/25/2016   Procedure: Left Heart Cath and Coronary Angiography;  Surgeon: Peter M Swaziland, MD;  Location: [facility] INVASIVE CV LAB;  Service: Cardiovascular;  Laterality: N/A;   CORONARY ANGIOPLASTY WITH STENT PLACEMENT  2010; 2013   CORONARY BALLOON ANGIOPLASTY N/A 10/19/2016   Procedure: Coronary Balloon Angioplasty;  Surgeon: Candyce GORMAN Reek, MD;  Location: [facility] INVASIVE CV LAB;  Service: Cardiovascular;  Laterality: N/A;   CORONARY ULTRASOUND/IVUS N/A 10/19/2016   Procedure: Intravascular Ultrasound/IVUS;  Surgeon: Candyce GORMAN  Dann, MD;  Location: [facility] INVASIVE CV LAB;  Service: Cardiovascular;  Laterality: N/A;   LEFT HEART CATH AND CORONARY ANGIOGRAPHY N/A 10/19/2016   Procedure: Left Heart Cath and Coronary Angiography;  Surgeon: Candyce GORMAN Dann, MD;  Location: [facility] INVASIVE CV LAB;  Service: Cardiovascular;  Laterality: N/A;   RIGHT/LEFT HEART CATH AND CORONARY ANGIOGRAPHY N/A 05/21/2020   Procedure: RIGHT/LEFT HEART CATH AND CORONARY ANGIOGRAPHY;  Surgeon: Rolan Ezra GORMAN, MD;  Location: [facility] INVASIVE CV LAB;  Service: Cardiovascular;  Laterality: N/A;   Family History  Problem Relation Age of Onset   Arrhythmia Mother        MOTHER LIVED TO BE 17   Coronary artery disease Mother    Heart attack Mother    Hypertension Mother    Coronary artery disease Father 41   Heart disease Father    Heart attack Father    Heart attack Brother    Stroke Neg Hx    Social History   Socioeconomic History   Marital status: Significant Other    Spouse name: Halford   Number of children: 2   Years of education: Not on file   Highest education level: Not on file   Occupational History   Occupation: Agricultural engineer: [employer]  Tobacco Use   Smoking status: Every Day    Current packs/day: 0.00    Average packs/day: 1 pack/day for 24.0 years (24.0 ttl pk-yrs)    Types: Cigarettes    Start date: 11/03/1992    Last attempt to quit: 11/03/2016    Years since quitting: 7.2   Smokeless tobacco: Never   Tobacco comments:    quit on 03/15/16  Vaping Use   Vaping status: Never Used  Substance and Sexual Activity   Alcohol use: No   Drug use: Not Currently    Types: Marijuana    Comment: last time 02/2016   Sexual activity: Yes  Other Topics Concern   Not on file  Social History Narrative   Diability      Sakiya (20)   Khmari (17)      Step daughter: Alvin (30)      Hobbies: fish, drag race   Social Drivers of Corporate investment banker Strain: Low Risk  (02/14/2024)   Overall Financial Resource Strain (CARDIA)    Difficulty of Paying Living Expenses: Not hard at all  Food Insecurity: No Food Insecurity (02/14/2024)   Hunger Vital Sign    Worried About Running Out of Food in the Last Year: Never true    Ran Out of Food in the Last Year: Never true  Transportation Needs: No Transportation Needs (02/14/2024)   PRAPARE - Administrator, Civil Service (Medical): No    Lack of Transportation (Non-Medical): No  Physical Activity: Sufficiently Active (02/14/2024)   Exercise Vital Sign    Days of Exercise per Week: 5 days    Minutes of Exercise per Session: 30 min  Stress: No Stress Concern Present (02/14/2024)   [employer] of Occupational Health - Occupational Stress Questionnaire    Feeling of Stress: Only a little  Social Connections: Moderately Integrated (02/14/2024)   Social Connection and Isolation Panel    Frequency of Communication with Friends and Family: More than three times a week    Frequency of Social Gatherings with Friends and Family: More than three times a week    Attends Religious Services: More than 4  times per year    Active Member of  Clubs or Organizations: No    Attends Banker Meetings: Never    Marital Status: Living with partner    Tobacco Counseling Ready to quit: Not Answered Counseling given: Not Answered Tobacco comments: quit on 03/15/16   Clinical Intake:  Pre-visit preparation completed: Yes  Pain : No/denies pain    BMI - recorded: 35.99 Nutritional Status: BMI > 30  Obese Nutritional Risks: None Diabetes: Yes CBG done?: No Did pt. bring in CBG monitor from home?: No  Lab Results  Component Value Date   HGBA1C 5.5 01/16/2024   HGBA1C 5.6 05/15/2023   HGBA1C 6.1 (A) 11/08/2022     How often do you need to have someone help you when you read instructions, pamphlets, or other written materials from your doctor or pharmacy?: 1 - Never  Interpreter Needed?: No  Comments: lives with partner and daughter Information entered by :: B.Boykin Baetz,LPN   Activities of Daily Living     02/14/2024    4:29 PM  In your present state of health, do you have any difficulty performing the following activities:  Hearing? 0  Vision? 0  Difficulty concentrating or making decisions? 0  Walking or climbing stairs? 0  Dressing or bathing? 0  Doing errands, shopping? 0  Preparing Food and eating ? N  Using the Toilet? N  In the past six months, have you accidently leaked urine? N  Do you have problems with loss of bowel control? N  Managing your Medications? N  Managing your Finances? N  Housekeeping or managing your Housekeeping? N    Patient Care Team: Wendee Lynwood HERO, NP as PCP - General (Nurse Practitioner) Delford Maude BROCKS, MD as PCP - Cardiology (Cardiology) Shlomo Wilbert SAUNDERS, MD as PCP - Sleep Medicine (Cardiology)  I have updated your Care Teams any recent Medical Services you may have received from other providers in the past year.     Assessment:   This is a routine wellness examination for [employer].  Hearing/Vision screen Hearing Screening -  Comments:: Pt say his hearing is good Vision Screening - Comments:: Pt says his vision is good w/glasses UTD w/eye exaams   Goals Addressed             This Visit's Progress    Patient Stated       To improve my health       Depression Screen     02/14/2024    4:06 PM 09/08/2022   12:08 PM 06/02/2020   10:05 AM 02/12/2020    3:56 PM 04/19/2016   12:44 PM 04/05/2016   11:08 AM 05/20/2015    4:23 PM  PHQ 2/9 Scores  PHQ - 2 Score 1 1 0 0 5 2 2   PHQ- 9 Score  4  0 12 8 9     Fall Risk     02/14/2024    4:03 PM 09/08/2022   12:08 PM 06/02/2020   10:05 AM 02/12/2020    3:56 PM 03/25/2019    9:59 AM  Fall Risk   Falls in the past year? 0 0 0 0 0   Number falls in past yr: 0  0    Injury with Fall? 0  0    Risk for fall due to : No Fall Risks      Follow up Education provided;Falls prevention discussed  Falls evaluation completed        Data saved with a previous flowsheet row definition    MEDICARE RISK AT HOME:  Medicare Risk at Home Any stairs in or around the home?: Yes If so, are there any without handrails?: Yes Home free of loose throw rugs in walkways, pet beds, electrical cords, etc?: Yes Adequate lighting in your home to reduce risk of falls?: Yes Life alert?: No Use of a cane, walker or w/c?: No Grab bars in the bathroom?: No Shower chair or bench in shower?: No Elevated toilet seat or a handicapped toilet?: No  TIMED UP AND GO:  Was the test performed?  Yes  Length of time to ambulate 10 feet: 10 sec Gait steady and fast without use of assistive device  Cognitive Function: 6CIT completed        02/14/2024    4:30 PM  6CIT Screen  What Year? 0 points  What month? 0 points  What time? 0 points  Count back from 20 0 points  Months in reverse 0 points  Repeat phrase 2 points  Total Score 2 points    Immunizations Immunization History  Administered Date(s) Administered   PFIZER(Purple Top)SARS-COV-2 Vaccination 04/26/2020, 05/17/2020     Screening Tests Health Maintenance  Topic Date Due   DTaP/Tdap/Td (1 - Tdap) Never done   Pneumococcal Vaccine 61-65 Years old (1 of 2 - PCV) Never done   Hepatitis B Vaccines (1 of 3 - 19+ 3-dose series) Never done   Colonoscopy  Never done   INFLUENZA VACCINE  03/15/2024   HEMOGLOBIN A1C  07/17/2024   OPHTHALMOLOGY EXAM  08/06/2024   FOOT EXAM  01/15/2025   Hepatitis C Screening  Completed   HIV Screening  Completed   HPV VACCINES  Aged Out   Meningococcal B Vaccine  Aged Out   COVID-19 Vaccine  Discontinued    Health Maintenance  Health Maintenance Due  Topic Date Due   DTaP/Tdap/Td (1 - Tdap) Never done   Pneumococcal Vaccine 78-55 Years old (1 of 2 - PCV) Never done   Hepatitis B Vaccines (1 of 3 - 19+ 3-dose series) Never done   Colonoscopy  Never done   Health Maintenance Items Addressed: Vaccinations: declines: Influenza vaccine: recommend every Fall Pneumococcal vaccine: recommend once per lifetime Prevnar-20 Tdap vaccine: recommend every 10 years Shingles vaccine: recommend Shingrix which is 2 doses 2-6 months apart and over 90% effective     Covid-19: recommend 2 doses one month apart with a booster 6 months later -DISCONTINUED  Additional Screening:  Vision Screening: Recommended annual ophthalmology exams for early detection of glaucoma and other disorders of the eye. Would you like a referral to an eye doctor? No    Dental Screening: Recommended annual dental exams for proper oral hygiene  Community Resource Referral / Chronic Care Management: CRR required this visit?  No   CCM required this visit?  Appt scheduled with PCP   Plan:    I have personally reviewed and noted the following in the patient's chart:   Medical and social history Use of alcohol, tobacco or illicit drugs  Current medications and supplements including opioid prescriptions. Patient is not currently taking opioid prescriptions. Functional ability and status Nutritional  status Physical activity Advanced directives List of other physicians Hospitalizations, surgeries, and ER visits in previous 12 months Vitals Screenings to include cognitive, depression, and falls Referrals and appointments  In addition, I have reviewed and discussed with patient certain preventive protocols, quality metrics, and best practice recommendations. A written personalized care plan for preventive services as well as general preventive health recommendations were provided to patient.  Erminio LITTIE Saris, LPN   09/21/7972   After Visit Summary: (MyChart) Due to this being a telephonic visit, the after visit summary with patients personalized plan was offered to patient via MyChart   Notes: Nothing significant to report at this time.

## 2024-02-19 DIAGNOSIS — I509 Heart failure, unspecified: Secondary | ICD-10-CM | POA: Diagnosis not present

## 2024-02-19 DIAGNOSIS — R809 Proteinuria, unspecified: Secondary | ICD-10-CM | POA: Diagnosis not present

## 2024-02-19 DIAGNOSIS — N179 Acute kidney failure, unspecified: Secondary | ICD-10-CM | POA: Diagnosis not present

## 2024-02-19 DIAGNOSIS — E559 Vitamin D deficiency, unspecified: Secondary | ICD-10-CM | POA: Diagnosis not present

## 2024-02-19 DIAGNOSIS — N182 Chronic kidney disease, stage 2 (mild): Secondary | ICD-10-CM | POA: Diagnosis not present

## 2024-02-19 DIAGNOSIS — I129 Hypertensive chronic kidney disease with stage 1 through stage 4 chronic kidney disease, or unspecified chronic kidney disease: Secondary | ICD-10-CM | POA: Diagnosis not present

## 2024-02-19 DIAGNOSIS — E1122 Type 2 diabetes mellitus with diabetic chronic kidney disease: Secondary | ICD-10-CM | POA: Diagnosis not present

## 2024-03-05 ENCOUNTER — Emergency Department
Admission: EM | Admit: 2024-03-05 | Discharge: 2024-03-05 | Disposition: A | Discharge status: Home / Self Care | Attending: Emergency Medicine | Admitting: Emergency Medicine

## 2024-03-05 ENCOUNTER — Other Ambulatory Visit: Payer: Self-pay

## 2024-03-05 ENCOUNTER — Emergency Department

## 2024-03-05 DIAGNOSIS — Z7902 Long term (current) use of antithrombotics/antiplatelets: Secondary | ICD-10-CM | POA: Diagnosis not present

## 2024-03-05 DIAGNOSIS — Z79899 Other long term (current) drug therapy: Secondary | ICD-10-CM | POA: Insufficient documentation

## 2024-03-05 DIAGNOSIS — I5042 Chronic combined systolic (congestive) and diastolic (congestive) heart failure: Secondary | ICD-10-CM | POA: Insufficient documentation

## 2024-03-05 DIAGNOSIS — I110 Hypertensive heart disease with heart failure: Secondary | ICD-10-CM | POA: Insufficient documentation

## 2024-03-05 DIAGNOSIS — Z7982 Long term (current) use of aspirin: Secondary | ICD-10-CM | POA: Insufficient documentation

## 2024-03-05 DIAGNOSIS — F419 Anxiety disorder, unspecified: Secondary | ICD-10-CM | POA: Insufficient documentation

## 2024-03-05 DIAGNOSIS — J45909 Unspecified asthma, uncomplicated: Secondary | ICD-10-CM | POA: Diagnosis not present

## 2024-03-05 DIAGNOSIS — I2510 Atherosclerotic heart disease of native coronary artery without angina pectoris: Secondary | ICD-10-CM | POA: Insufficient documentation

## 2024-03-05 DIAGNOSIS — R0602 Shortness of breath: Secondary | ICD-10-CM | POA: Diagnosis not present

## 2024-03-05 LAB — BASIC METABOLIC PANEL WITH GFR
Anion gap: 10 (ref 5–15)
BUN: 15 mg/dL (ref 6–20)
CO2: 25 mmol/L (ref 22–32)
Calcium: 9.2 mg/dL (ref 8.9–10.3)
Chloride: 106 mmol/L (ref 98–111)
Creatinine, Ser: 1.2 mg/dL (ref 0.61–1.24)
GFR, Estimated: >60 mL/min (ref >60)
Glucose, Bld: 98 mg/dL (ref 70–99)
Potassium: 4 mmol/L (ref 3.5–5.1)
Sodium: 141 mmol/L (ref 135–145)

## 2024-03-05 LAB — RESP PANEL BY RT-PCR (RSV, FLU A&B, COVID)  RVPGX2
Influenza A by PCR: NEGATIVE
Influenza B by PCR: NEGATIVE
Resp Syncytial Virus by PCR: NEGATIVE
SARS Coronavirus 2 by RT PCR: NEGATIVE

## 2024-03-05 LAB — CBC
HCT: 44.4 % (ref 39.0–52.0)
Hemoglobin: 15.3 g/dL (ref 13.0–17.0)
MCH: 30 pg (ref 26.0–34.0)
MCHC: 34.5 g/dL (ref 30.0–36.0)
MCV: 87.1 fL (ref 80.0–100.0)
Platelets: 171 K/uL (ref 150–400)
RBC: 5.1 MIL/uL (ref 4.22–5.81)
RDW: 14.2 % (ref 11.5–15.5)
WBC: 8 K/uL (ref 4.0–10.5)
nRBC: 0 % (ref 0.0–0.2)

## 2024-03-05 LAB — MAGNESIUM: Magnesium: 1.9 mg/dL (ref 1.7–2.4)

## 2024-03-05 LAB — BRAIN NATRIURETIC PEPTIDE: B Natriuretic Peptide: 652.8 pg/mL — ABNORMAL HIGH (ref 0.0–100.0)

## 2024-03-05 LAB — TROPONIN I (HIGH SENSITIVITY)
Troponin I (High Sensitivity): <2 ng/L (ref <18)
Troponin I (High Sensitivity): <2 ng/L (ref <18)

## 2024-03-05 MED ORDER — IPRATROPIUM-ALBUTEROL 0.5-2.5 (3) MG/3ML IN SOLN
3.0000 mL | Freq: Once | RESPIRATORY_TRACT | Status: AC
  Administered 2024-03-05: 3 mL via RESPIRATORY_TRACT
  Filled 2024-03-05: qty 3

## 2024-03-05 MED ORDER — LORAZEPAM 1 MG PO TABS: 1.0000 mg | ORAL_TABLET | Freq: Three times a day (TID) | ORAL | 0 refills | Status: AC | PRN

## 2024-03-05 MED ORDER — LORAZEPAM 1 MG PO TABS
1.0000 mg | ORAL_TABLET | Freq: Once | ORAL | Status: AC
  Administered 2024-03-05: 1 mg via ORAL
  Filled 2024-03-05: qty 1

## 2024-03-05 MED ORDER — PREDNISONE 20 MG PO TABS
60.0000 mg | ORAL_TABLET | Freq: Once | ORAL | Status: AC
  Administered 2024-03-05: 60 mg via ORAL
  Filled 2024-03-05: qty 3

## 2024-03-05 MED ORDER — PREDNISONE 20 MG PO TABS: 60.0000 mg | ORAL_TABLET | Freq: Every day | ORAL | 0 refills | Status: AC

## 2024-03-05 MED ORDER — ALBUTEROL SULFATE HFA 108 (90 BASE) MCG/ACT IN AERS: 2.0000 | INHALATION_SPRAY | RESPIRATORY_TRACT | 0 refills | Status: AC | PRN

## 2024-03-05 MED ORDER — METHYLPREDNISOLONE SODIUM SUCC 125 MG IJ SOLR: 125.0000 mg | Freq: Once | INTRAMUSCULAR | Status: DC

## 2024-03-05 NOTE — ED Provider Notes (Signed)
 [facility] Provider Note    Event Date/Time   First MD Initiated Contact with Patient 03/05/24 [phone]     (approximate)   History   Shortness of Breath   HPI  SOPHEAP BOEHLE is a 48 y.o. male with history of asthma, anxiety, CAD, CHF, hypertension, hyperlipidemia, SVT who presents to the emergency department complaints of chest pain, shortness of breath, dry cough.  No lower extremity swelling or pain.  No history of PE, DVT, recent prolonged immobilization such as hospitalization, surgery, fracture, trauma.  He states that he gets very anxious about his health and thinks that anxiety is playing a role.  No SI, HI.  He does report he has been wheezing.     History provided by patient.    Past Medical History:  Diagnosis Date   Anxiety    CAD S/P percutaneous coronary angioplasty    a. s/p BMS-mLAD 2010. b. DES to RCA 2012. c. 04/2015: DES to LCx and PCI to LAD c/b dissection with subsequent overlapping DES to mLAD - r/i for NSTEMI afterwards; d. 08/2015 Ant STEMI in setting of noncompliance->170mLAD ISR (3.0x16 Synergy DES); e. 03/2016 PCI of apical LAD; f. 03/2016 Relook Cath: patent LAD stents, RCA 100p CTO-->Med Rx; g. 10/2016 Ant STEMI: PTCA 100p LAD.   Chronic combined systolic and diastolic CHF (congestive heart failure) (HCC)    a. 03/2016 Echo: EF 30-35%, Gr2 DD;  b. 08/2016 Echo: EF 40%.   Depression    Essential hypertension    GAD (generalized anxiety disorder)    GERD (gastroesophageal reflux disease)    Hypercholesteremia    Ischemic cardiomyopathy    a. prior EF 25-35 percent at cath, 35-40 percent by echo; b. 03/2016 Echo: EF 30-35%, diff HK, Gr2 DD; c. 08/2016 Echo: EF 40%, base/mid inferior/inferowseptal AK, mildly dil LA.   Morbid obesity (HCC)    Pre-diabetes    SVT (supraventricular tachycardia) (HCC) APRROX 10 YRS AGO   PRESUMED AVNRT, ECHO 12/07 WITH EF 65%, MILD LAE   Tobacco abuse    a. 20 + pack years, quit 10/2016.    Past  Surgical History:  Procedure Laterality Date   CARDIAC CATHETERIZATION N/A 04/23/2015   Procedure: Left Heart Cath and Coronary Angiography;  Surgeon: Maude JAYSON Emmer, MD;  Location: [facility] INVASIVE CV LAB;  Service: Cardiovascular;  Laterality: N/A;   CARDIAC CATHETERIZATION N/A 04/23/2015   Procedure: Coronary Stent Intervention;  Surgeon: Ozell Fell, MD;  Location: [facility] INVASIVE CV LAB;  Service: Cardiovascular;  Laterality: N/A;   CARDIAC CATHETERIZATION N/A 09/14/2015   Procedure: Left Heart Cath and Coronary Angiography;  Surgeon: Victory LELON Sharps, MD;  LAD 100%, CFX 10% ISR, RCA 100% (chronic), EF 25-35%   CARDIAC CATHETERIZATION N/A 09/14/2015   Procedure: Coronary Stent Intervention;  Surgeon: Victory LELON Sharps, MD;  Location: [facility] INVASIVE CV LAB;  Service: Cardiovascular;  Laterality: N/A; 3.0 x 16 mm Synergy DES LAD   CARDIAC CATHETERIZATION  03/16/2016   CARDIAC CATHETERIZATION N/A 03/16/2016   Procedure: Left Heart Cath and Coronary Angiography;  Surgeon: Victory LELON Sharps, MD;  Location: [facility] INVASIVE CV LAB;  Service: Cardiovascular;  Laterality: N/A;   CARDIAC CATHETERIZATION N/A 03/16/2016   Procedure: Coronary Balloon Angioplasty;  Surgeon: Victory LELON Sharps, MD;  Location: [facility] INVASIVE CV LAB;  Service: Cardiovascular;  Laterality: N/A;   CARDIAC CATHETERIZATION N/A 03/25/2016   Procedure: Left Heart Cath and Coronary Angiography;  Surgeon: Peter M Swaziland, MD;  Location: [facility] INVASIVE CV LAB;  Service: Cardiovascular;  Laterality: N/A;   CORONARY ANGIOPLASTY WITH STENT PLACEMENT  2010; 2013   CORONARY BALLOON ANGIOPLASTY N/A 10/19/2016   Procedure: Coronary Balloon Angioplasty;  Surgeon: Candyce GORMAN Reek, MD;  Location: [facility] INVASIVE CV LAB;  Service: Cardiovascular;  Laterality: N/A;   CORONARY ULTRASOUND/IVUS N/A 10/19/2016   Procedure: Intravascular Ultrasound/IVUS;  Surgeon: Candyce GORMAN Reek, MD;  Location: [facility] INVASIVE CV LAB;  Service: Cardiovascular;  Laterality: N/A;   LEFT HEART CATH AND CORONARY  ANGIOGRAPHY N/A 10/19/2016   Procedure: Left Heart Cath and Coronary Angiography;  Surgeon: Candyce GORMAN Reek, MD;  Location: [facility] INVASIVE CV LAB;  Service: Cardiovascular;  Laterality: N/A;   RIGHT/LEFT HEART CATH AND CORONARY ANGIOGRAPHY N/A 05/21/2020   Procedure: RIGHT/LEFT HEART CATH AND CORONARY ANGIOGRAPHY;  Surgeon: Rolan Ezra GORMAN, MD;  Location: [facility] INVASIVE CV LAB;  Service: Cardiovascular;  Laterality: N/A;    MEDICATIONS:  Prior to Admission medications   Medication Sig Start Date End Date Taking? Authorizing Provider  acetaminophen  (TYLENOL ) 325 MG tablet Take 2 tablets (650 mg total) by mouth every 4 (four) hours as needed for headache or mild pain. 11/13/16   Maxie Herlene POUR, PA-C  aspirin  81 MG EC tablet Take 1 tablet (81 mg total) by mouth daily. 10/21/16   Duke, Jon Garre, PA  atorvastatin  (LIPITOR ) 80 MG tablet TAKE 1 TABLET (80 MG TOTAL) BY MOUTH DAILY. NEEDS FOLLOW UP APPOINTMENT FOR MORE REFILLS 12/27/23   Glena Harlene HERO, FNP  benzonatate  (TESSALON ) 200 MG capsule Take 1 capsule (200 mg total) by mouth 3 (three) times daily as needed. 09/22/23   Wendee Lynwood HERO, NP  blood glucose meter kit and supplies Dispense based on patient and insurance preference. Use up to four times daily as directed. (FOR ICD-10 E10.9, E11.9). 05/24/20   McDaniel, Jill D, NP  buPROPion  (WELLBUTRIN  XL) 150 MG 24 hr tablet TAKE 1 TABLET BY MOUTH EVERY DAY 11/04/20   Nishan, Peter C, MD  carvedilol  (COREG ) 25 MG tablet TAKE 1 TABLET (25 MG TOTAL) 2 (TWO) TIMES DAILY WITH A MEAL. PLEASE CALL TO SCHEDULE FOLLOW UP 02/12/24   Bensimhon, Toribio SAUNDERS, MD  clopidogrel  (PLAVIX ) 75 MG tablet Take 1 tablet (75 mg total) by mouth daily. 07/21/22   Ghimire, Donalda HERO, MD  clotrimazole -betamethasone  (LOTRISONE ) cream Apply 1 application  topically daily as needed (rash).    [provider]  dapagliflozin  propanediol (FARXIGA ) 10 MG TABS tablet Take 1 tablet (10 mg total) by mouth daily before breakfast. 08/15/23    Shamleffer, Donell Cardinal, MD  digoxin  (LANOXIN ) 0.125 MG tablet Take 1 tablet (125 mcg total) by mouth daily. 06/07/23   Milford, Harlene HERO, FNP  Evolocumab  (REPATHA  SURECLICK) 140 MG/ML SOAJ INJECT 1 PEN INTO THE SKIN EVERY 14 (FOURTEEN) DAYS. 10/06/22   Shamleffer, Ibtehal Jaralla, MD  fenofibrate  160 MG tablet Take 1 tablet (160 mg total) by mouth daily. 01/11/22   Milford, Harlene HERO, FNP  furosemide  (LASIX ) 40 MG tablet Take 1 tablet (40 mg total) by mouth daily. 03/14/22   Rolan Ezra GORMAN, MD  icosapent  Ethyl (VASCEPA ) 1 g capsule Take 2 capsules (2 g total) by mouth 2 (two) times daily. 11/10/20   Rolan Ezra GORMAN, MD  metFORMIN  (GLUCOPHAGE -XR) 500 MG 24 hr tablet Take 1 tablet (500 mg total) by mouth 2 (two) times daily. 05/15/23   Shamleffer, Ibtehal Jaralla, MD  nitroGLYCERIN  (NITROSTAT ) 0.4 MG SL tablet Place 1 tablet (0.4 mg total) under the tongue every 5 (five) minutes as needed  for chest pain. 03/11/22   Rolan Ezra RAMAN, MD  potassium chloride  SA (KLOR-CON  M) 20 MEQ tablet Take 2 tablets (40 mEq total) by mouth daily. 01/11/22   Milford, Harlene HERO, FNP  sacubitril -valsartan  (ENTRESTO ) 97-103 MG Take 1 tablet by mouth 2 (two) times daily. 01/11/22   Milford, Harlene HERO, FNP  Semaglutide , 1 MG/DOSE, 4 MG/3ML SOPN Inject 1 mg as directed once a week. 01/16/24   Shamleffer, Ibtehal Jaralla, MD  spironolactone  (ALDACTONE ) 25 MG tablet Take 1 tablet (25 mg total) by mouth daily. 01/11/22   Glena Harlene HERO, FNP    Physical Exam   Triage Vital Signs: ED Triage Vitals  Encounter Vitals Group     BP 03/05/24 0211 (!) 139/95     Girls Systolic BP Percentile --      Girls Diastolic BP Percentile --      Boys Systolic BP Percentile --      Boys Diastolic BP Percentile --      Pulse Rate 03/05/24 0211 89     Resp 03/05/24 0211 20     Temp 03/05/24 0211 97.7 F (36.5 C)     Temp Source 03/05/24 0211 Oral     SpO2 03/05/24 0211 99 %     Weight 03/05/24 0209 229 lb (103.9 kg)     Height  03/05/24 0209 5' 7 (1.702 m)     Head Circumference --      Peak Flow --      Pain Score 03/05/24 0209 5     Pain Loc --      Pain Education --      Exclude from Growth Chart --     Most recent vital signs: Vitals:   03/05/24 0527 03/05/24 0632  BP: 126/74 126/74  Pulse: 80 74  Resp: 20 (!) 22  Temp: 97.8 F (36.6 C) 97.8 F (36.6 C)  SpO2: 100% 97%    CONSTITUTIONAL: Alert, responds appropriately to questions. Well-appearing; well-nourished HEAD: Normocephalic, atraumatic EYES: Conjunctivae clear, pupils appear equal, sclera nonicteric ENT: normal nose; moist mucous membranes NECK: Supple, normal ROM CARD: RRR; S1 and S2 appreciated RESP: Normal chest excursion without splinting or tachypnea; breath sounds clear and equal bilaterally; no wheezes, no rhonchi, no rales, no hypoxia or respiratory distress, speaking full sentences, diminished aeration at bases bilaterally ABD/GI: Non-distended; soft, non-tender, no rebound, no guarding, no peritoneal signs BACK: The back appears normal EXT: Normal ROM in all joints; no deformity noted, no edema SKIN: Normal color for age and race; warm; no rash on exposed skin NEURO: Moves all extremities equally, normal speech PSYCH: Appears slightly anxious.   ED Results / Procedures / Treatments   LABS: (all labs ordered are listed, but only abnormal results are displayed) Labs Reviewed  BRAIN NATRIURETIC PEPTIDE - Abnormal; Notable for the following components:      Result Value   B Natriuretic Peptide 652.8 (*)    All other components within normal limits  RESP PANEL BY RT-PCR (RSV, FLU A&B, COVID)  [id number]  BASIC METABOLIC PANEL WITH GFR  CBC  MAGNESIUM   TROPONIN I (HIGH SENSITIVITY)  TROPONIN I (HIGH SENSITIVITY)     EKG:  EKG Interpretation Date/Time:  Tuesday March 05 2024 02:16:03 EDT Ventricular Rate:  82 PR Interval:  196 QRS Duration:  144 QT Interval:  420 QTC Calculation: 490 R Axis:   -70  Text  Interpretation: Normal sinus rhythm Left axis deviation Left bundle branch block Abnormal ECG No significant change since last tracing  Confirmed by Neomi Neptune [phone]) on 03/05/2024 4:39:04 AM         RADIOLOGY: My personal review and interpretation of imaging: Chest x-ray clear.  I have personally reviewed all radiology reports.   No results found.    PROCEDURES:  Critical Care performed: No    .1-3 Lead EKG Interpretation  Performed by: Alton Bouknight, Neptune SAILOR, DO Authorized by: Dinesh Ulysse, Neptune SAILOR, DO     Interpretation: normal     ECG rate:  74   ECG rate assessment: normal     Rhythm: sinus rhythm     Ectopy: none     Conduction: normal       IMPRESSION / MDM / ASSESSMENT AND PLAN / ED COURSE  I reviewed the triage vital signs and the nursing notes.    Patient here with complaints of chest pain and shortness of breath.  States he has been feeling very anxious but also has history of CAD, CHF.  The patient is on the cardiac monitor to evaluate for evidence of arrhythmia and/or significant heart rate changes.   DIFFERENTIAL DIAGNOSIS (includes but not limited to):   ACS, CHF, viral URI, pneumonia, asthma exacerbation, anxiety, PE, doubt dissection, pneumothorax   Patient's presentation is most consistent with acute presentation with potential threat to life or bodily function.   PLAN: EKG shows no new ischemic change.  Normal hemoglobin, electrolytes.  Initial troponin negative.  Second troponin pending.  BNP elevated at 652 but no peripheral edema on exam, JVD and chest x-ray reviewed and interpreted by myself and the radiologist is clear.  Denies any orthopnea, weight gain.  Repeat troponin pending.  No risk factors for PE.  He does report some wheezing but not wheezing currently but is diminished.  Will give additional breathing treatment, prednisone  as he reports history of bronchospasm although I do not see asthma or COPD listed in his chart.  We did discuss that  sometimes wheezing can be secondary to pulmonary edema however he does not appear volume overloaded currently.  COVID, flu and RSV negative today.  He also thinks that anxiety is playing a role.  No SI, HI.  Not responding to internal stimuli.  Will give oral Ativan  for symptomatic relief.   MEDICATIONS GIVEN IN ED: Medications  ipratropium-albuterol  (DUONEB) 0.5-2.5 (3) MG/3ML nebulizer solution 3 mL (3 mLs Nebulization Given 03/05/24 0225)  ipratropium-albuterol  (DUONEB) 0.5-2.5 (3) MG/3ML nebulizer solution 3 mL (3 mLs Nebulization Given 03/05/24 0517)  LORazepam  (ATIVAN ) tablet 1 mg (1 mg Oral Given 03/05/24 0518)  predniSONE  (DELTASONE ) tablet 60 mg (60 mg Oral Given 03/05/24 0518)     ED COURSE: Second troponin negative.  Patient reports feeling better after additional breathing treatments, steroids and Ativan .  Now resting comfortably.  No events seen on cardiac monitoring.  Will have him follow-up with his outpatient providers.  Will discharge with prescription for albuterol  inhaler, prednisone  and Ativan  to take as needed.  Discussed return precautions.  Patient verbalized understanding and is comfortable with this plan.   At this time, I do not feel there is any life-threatening condition present. I reviewed all nursing notes, vitals, pertinent previous records.  All lab and urine results, EKGs, imaging ordered have been independently reviewed and interpreted by myself.  I reviewed all available radiology reports from any imaging ordered this visit.  Based on my assessment, I feel the patient is safe to be discharged home without further emergent workup and can continue workup as an outpatient as needed. Discussed all findings,  treatment plan as well as usual and customary return precautions.  They verbalize understanding and are comfortable with this plan.  Outpatient follow-up has been provided as needed.  All questions have been answered.    CONSULTS:  none   OUTSIDE RECORDS REVIEWED:  Reviewed last heart failure note on 01/23/2024.       FINAL CLINICAL IMPRESSION(S) / ED DIAGNOSES   Final diagnoses:  Shortness of breath  Anxiety     Rx / DC Orders   ED Discharge Orders          Ordered    albuterol  (VENTOLIN  HFA) 108 (90 Base) MCG/ACT inhaler  Every 4 hours PRN        03/05/24 0625    predniSONE  (DELTASONE ) 20 MG tablet  Daily        03/05/24 0625    LORazepam  (ATIVAN ) 1 MG tablet  Every 8 hours PRN        03/05/24 9374             Note:  This document was prepared using Dragon voice recognition software and may include unintentional dictation errors.   Leeam Cedrone, Josette SAILOR, DO 03/06/24 [phone]

## 2024-03-05 NOTE — ED Triage Notes (Addendum)
 Pt to ED via POV c/o SOB. Reports it started a few hrs ago. Hx of CHF and HTN. Has not used any inhalers/nebulizers PTA. Able to speak in complete sentences, breathing equal and unlabored

## 2024-03-05 NOTE — ED Notes (Signed)
 Green top sent to lab at this time

## 2024-03-28 ENCOUNTER — Encounter (HOSPITAL_COMMUNITY): Payer: Self-pay

## 2024-03-28 ENCOUNTER — Ambulatory Visit (HOSPITAL_COMMUNITY): Admission: EM | Admit: 2024-03-28 | Discharge: 2024-03-28 | Disposition: A | Discharge status: Home / Self Care

## 2024-03-28 DIAGNOSIS — G4489 Other headache syndrome: Secondary | ICD-10-CM

## 2024-03-28 HISTORY — DX: Acute myocardial infarction, unspecified: I21.9
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
## 2024-03-28 NOTE — ED Notes (Signed)
No answer in waiting area.

## 2024-03-28 NOTE — ED Notes (Signed)
 Pt states he was out in his truck.

## 2024-03-28 NOTE — ED Notes (Signed)
 Patient c/o an intermittent  headache x 2 days. Patient denies N/v, blurred vision, and light sensitivity.   Patient states he took Aspirin  at 1400 for his headache.

## 2024-03-28 NOTE — Discharge Instructions (Signed)
 I dont find anything abnormal in your exam today like a stroke. Your neurological exam is normal today.

## 2024-04-19 ENCOUNTER — Other Ambulatory Visit (HOSPITAL_COMMUNITY): Payer: Self-pay | Admitting: Family Medicine

## 2024-04-20 ENCOUNTER — Emergency Department

## 2024-04-20 ENCOUNTER — Other Ambulatory Visit: Payer: Self-pay

## 2024-04-20 ENCOUNTER — Emergency Department
Admission: EM | Admit: 2024-04-20 | Discharge: 2024-04-20 | Disposition: A | Discharge status: Home / Self Care | Attending: Emergency Medicine | Admitting: Emergency Medicine

## 2024-04-20 DIAGNOSIS — I2510 Atherosclerotic heart disease of native coronary artery without angina pectoris: Secondary | ICD-10-CM | POA: Insufficient documentation

## 2024-04-20 DIAGNOSIS — I509 Heart failure, unspecified: Secondary | ICD-10-CM | POA: Diagnosis not present

## 2024-04-20 DIAGNOSIS — I110 Hypertensive heart disease with heart failure: Secondary | ICD-10-CM | POA: Insufficient documentation

## 2024-04-20 DIAGNOSIS — J45909 Unspecified asthma, uncomplicated: Secondary | ICD-10-CM | POA: Insufficient documentation

## 2024-04-20 DIAGNOSIS — R0602 Shortness of breath: Secondary | ICD-10-CM | POA: Diagnosis not present

## 2024-04-20 DIAGNOSIS — R0989 Other specified symptoms and signs involving the circulatory and respiratory systems: Secondary | ICD-10-CM | POA: Diagnosis not present

## 2024-04-20 DIAGNOSIS — J811 Chronic pulmonary edema: Secondary | ICD-10-CM | POA: Diagnosis not present

## 2024-04-20 HISTORY — DX: Type 2 diabetes mellitus without complications: E11.9

## 2024-04-20 LAB — CBC
HCT: 45.6 % (ref 39.0–52.0)
Hemoglobin: 15.8 g/dL (ref 13.0–17.0)
MCH: 30.3 pg (ref 26.0–34.0)
MCHC: 34.6 g/dL (ref 30.0–36.0)
MCV: 87.5 fL (ref 80.0–100.0)
Platelets: 191 K/uL (ref 150–400)
RBC: 5.21 MIL/uL (ref 4.22–5.81)
RDW: 14 % (ref 11.5–15.5)
WBC: 7.3 K/uL (ref 4.0–10.5)
nRBC: 0 % (ref 0.0–0.2)

## 2024-04-20 LAB — BASIC METABOLIC PANEL WITH GFR
Anion gap: 8 (ref 5–15)
BUN: 12 mg/dL (ref 6–20)
CO2: 29 mmol/L (ref 22–32)
Calcium: 9 mg/dL (ref 8.9–10.3)
Chloride: 104 mmol/L (ref 98–111)
Creatinine, Ser: 1.07 mg/dL (ref 0.61–1.24)
GFR, Estimated: >60 mL/min (ref >60)
Glucose, Bld: 113 mg/dL — ABNORMAL HIGH (ref 70–99)
Potassium: 3.9 mmol/L (ref 3.5–5.1)
Sodium: 141 mmol/L (ref 135–145)

## 2024-04-20 LAB — BRAIN NATRIURETIC PEPTIDE: B Natriuretic Peptide: 376 pg/mL — ABNORMAL HIGH (ref 0.0–100.0)

## 2024-04-20 LAB — TROPONIN I (HIGH SENSITIVITY)
Troponin I (High Sensitivity): <2 ng/L (ref <18)
Troponin I (High Sensitivity): <2 ng/L (ref <18)

## 2024-04-20 MED ORDER — FUROSEMIDE 10 MG/ML IJ SOLN
40.0000 mg | Freq: Once | INTRAMUSCULAR | Status: AC
  Administered 2024-04-20: 40 mg via INTRAVENOUS
  Filled 2024-04-20: qty 4

## 2024-04-20 NOTE — ED Provider Notes (Signed)
 [facility] Provider Note    Event Date/Time   First MD Initiated Contact with Patient 04/20/24 1114     (approximate)   History   Shortness of Breath   HPI  Peter Russo is a 48 y.o. male with a history of asthma, CAD, CHF, hypertension, hyperlipidemia, SVT, and anxiety who presents with shortness of breath over the last 1 to 2 days, gradual onset, worse when he is trying to lie flat and not exacerbated with exertion.  He denies any associated leg swelling.  He has no chest pain.  He denies cough or fever.  He states that he has been intermittently compliant with his medications have missed some doses of his fluid pill.  I reviewed the past medical records.  The patient was most recently seen at an outside urgent care center on 8/14 with headache.  He was evaluated at the heart failure clinic on 6/10 for heart failure follow-up.   Physical Exam   Triage Vital Signs: ED Triage Vitals  Encounter Vitals Group     BP 04/20/24 1047 135/86     Girls Systolic BP Percentile --      Girls Diastolic BP Percentile --      Boys Systolic BP Percentile --      Boys Diastolic BP Percentile --      Pulse Rate 04/20/24 1047 78     Resp 04/20/24 1047 20     Temp 04/20/24 1047 97.9 F (36.6 C)     Temp Source 04/20/24 1047 Oral     SpO2 04/20/24 1047 99 %     Weight 04/20/24 1051 230 lb (104.3 kg)     Height 04/20/24 1051 5' 7 (1.702 m)     Head Circumference --      Peak Flow --      Pain Score 04/20/24 1049 0     Pain Loc --      Pain Education --      Exclude from Growth Chart --     Most recent vital signs: Vitals:   04/20/24 1047  BP: 135/86  Pulse: 78  Resp: 20  Temp: 97.9 F (36.6 C)  SpO2: 99%     General: Awake, no distress.  CV:  Good peripheral perfusion.  Resp:  Normal effort.  Abd:  No distention.  Other:  Trace bilateral lower extremity edema.   ED Results / Procedures / Treatments   Labs (all labs ordered are listed, but  only abnormal results are displayed) Labs Reviewed  BASIC METABOLIC PANEL WITH GFR - Abnormal; Notable for the following components:      Result Value   Glucose, Bld 113 (*)    All other components within normal limits  BRAIN NATRIURETIC PEPTIDE - Abnormal; Notable for the following components:   B Natriuretic Peptide 376.0 (*)    All other components within normal limits  CBC  TROPONIN I (HIGH SENSITIVITY)  TROPONIN I (HIGH SENSITIVITY)     EKG  ED ECG REPORT I, Waylon Cassis, the attending physician, personally viewed and interpreted this ECG.  Date: 04/20/2024 EKG Time: 1035 Rate: 83 Rhythm: normal sinus rhythm with frequent PVCs QRS Axis: Left axis Intervals: normal ST/T Wave abnormalities: Nonspecific ST abnormalities Narrative Interpretation: no evidence of acute ischemia    RADIOLOGY  Chest x-ray: I independently viewed and interpreted the images; there are bilateral interstitial opacities consistent with mild edema  PROCEDURES:  Critical Care performed: No  Procedures   MEDICATIONS ORDERED  IN ED: Medications  furosemide  (LASIX ) injection 40 mg (40 mg Intravenous Given 04/20/24 1211)     IMPRESSION / MDM / ASSESSMENT AND PLAN / ED COURSE  I reviewed the triage vital signs and the nursing notes.  48 year old male with PMH as noted above presents with worsening shortness of breath over the last 1 to 2 days.  On exam his O2 saturation is in the high 90s on room air.  He has no significant peripheral edema.  Differential diagnosis includes, but is not limited to, CHF exacerbation, fluid overload, asthma/COPD, pneumonia, acute bronchitis.  Chest x-ray shows likely edema.  We will give a dose of IV Lasix  and await lab workup including cardiac enzymes.  Patient's presentation is most consistent with acute complicated illness / injury requiring diagnostic workup.  The patient is on the cardiac monitor to evaluate for evidence of arrhythmia and/or significant  heart rate changes.  ----------------------------------------- 2:01 PM on 04/20/2024 -----------------------------------------  BMP and CBC show no acute findings.  BNP is somewhat elevated.  Troponins are negative x 2.  The patient is having good urine output with IV Lasix .  He is feeling better.  Given that he is not hypoxic and not having any acute respiratory symptoms, he is stable for discharge home at this time.  I counseled him on the results of the workup and plan of care.  He is already prescribed oral Lasix  at home.  I gave strict return precautions, and he expressed understanding.   FINAL CLINICAL IMPRESSION(S) / ED DIAGNOSES   Final diagnoses:  Acute on chronic congestive heart failure, unspecified heart failure type (HCC)     Rx / DC Orders   ED Discharge Orders     None        Note:  This document was prepared using Dragon voice recognition software and may include unintentional dictation errors.    Jacolyn Pae, MD 04/20/24 1402

## 2024-04-20 NOTE — Discharge Instructions (Signed)
 Your shortness of breath is likely due to fluid in your lungs from your CHF.  Continue taking all of your medications diligently, especially your fluid pill.  Follow-up with your primary care doctor and at the heart failure clinic.  Return to the ER for new, worsening, or persistent severe shortness of breath, chest pain, weakness or lightheadedness, or any other new or worsening symptoms that concern you.

## 2024-04-20 NOTE — ED Triage Notes (Signed)
 Pt to ED for SOB since 3 days. SOB worse at night when lays flat. Pt takes Lasix  for HF.  Respirations are unlabored. Skin dry. Denies chest pain.

## 2024-06-11 NOTE — Progress Notes (Addendum)
 Peter Russo                                          MRN: [id number]   06/11/2024   The [facility] Quality Team Specialist reviewed this patient medical record for the purposes of chart review for care gap closure. The following were reviewed Kidney function    [facility] Quality Team

## 2024-06-24 ENCOUNTER — Other Ambulatory Visit: Payer: Self-pay | Admitting: Internal Medicine

## 2024-06-24 DIAGNOSIS — E785 Hyperlipidemia, unspecified: Secondary | ICD-10-CM

## 2024-07-17 ENCOUNTER — Other Ambulatory Visit

## 2024-07-17 ENCOUNTER — Encounter: Payer: Self-pay | Admitting: Internal Medicine

## 2024-07-17 ENCOUNTER — Ambulatory Visit (INDEPENDENT_AMBULATORY_CARE_PROVIDER_SITE_OTHER): Admitting: Internal Medicine

## 2024-07-17 VITALS — BP 142/82 | Ht 67.0 in | Wt 232.0 lb

## 2024-07-17 DIAGNOSIS — E1129 Type 2 diabetes mellitus with other diabetic kidney complication: Secondary | ICD-10-CM | POA: Diagnosis not present

## 2024-07-17 DIAGNOSIS — E1159 Type 2 diabetes mellitus with other circulatory complications: Secondary | ICD-10-CM

## 2024-07-17 DIAGNOSIS — R809 Proteinuria, unspecified: Secondary | ICD-10-CM | POA: Diagnosis not present

## 2024-07-17 DIAGNOSIS — Z794 Long term (current) use of insulin: Secondary | ICD-10-CM | POA: Diagnosis not present

## 2024-07-17 LAB — POCT GLYCOSYLATED HEMOGLOBIN (HGB A1C): Hemoglobin A1C: 5.8 % — AB (ref 4.0–5.6)

## 2024-07-17 MED ORDER — METFORMIN HCL ER 500 MG PO TB24: 500.0000 mg | ORAL_TABLET | Freq: Every day | ORAL | 3 refills | Status: AC

## 2024-07-17 MED ORDER — DAPAGLIFLOZIN PROPANEDIOL 10 MG PO TABS: 10.0000 mg | ORAL_TABLET | Freq: Every day | ORAL | 5 refills | Status: AC

## 2024-07-17 MED ORDER — SEMAGLUTIDE (1 MG/DOSE) 4 MG/3ML ~~LOC~~ SOPN: 1.0000 mg | PEN_INJECTOR | SUBCUTANEOUS | 3 refills | Status: AC

## 2024-07-17 NOTE — Progress Notes (Signed)
 Name: Peter Russo  Age/ Sex: 48 y.o., male   MRN/ DOB: [id number], 22-May-1976     PCP: Wendee Lynwood HERO, NP   Reason for Endocrinology Evaluation: Type 2 Diabetes Mellitus  Initial Endocrine Consultative Visit: 09/23/2020    PATIENT IDENTIFIER: Peter Russo is a 48 y.o. male with a past medical history of T2Dm, CAD ( S/P PCI), CHF, and dyslipidemia The patient has followed with Endocrinology clinic since 09/23/2020 for consultative assistance with management of his diabetes.  DIABETIC HISTORY:  Peter Russo was diagnosed with DM 02/2020. His hemoglobin A1c has ranged from 11.3% in7/2021, peaking at 12.2% in 2021.  ON his initial visit to our clinic he had an A1c of 6.8% , we switched Metformin  to XR , continued Lantus  and Farxiga     He was started on Ozempic  03/2021 by cardiology for weight loss in preparation  for heart transplant    Basal insulin  discontinued 04/2023 with an A1c of 5.6%   SUBJECTIVE:   During the last visit (01/16/2024): A1c 5.5 %    Today (07/17/2024): Peter Russo is here for diabetes management.      Patient continues to follow-up with nephrology (Dr. Dolan ) )  He continues to follow-up with cardiology for CHF   Has rare mild nausea but no vomiting  No heartburn  No constipation or diarrhea     HOME DIABETES REGIMEN:  Metformin  500 mg XR , 1 tablet twice daily Farxiga  10 mg, 1 tablet with Breakfast  Ozempic  1 mg weekly    Statin: yes ACE-I/ARB: yes    METER DOWNLOAD SUMMARY: Did not bring     DIABETIC COMPLICATIONS: Microvascular complications:   Denies: CKD, retinopathy, neuropathy  Last Eye Exam: Completed 08/07/2023  Macrovascular complications:  CAD ( S/P PCI) , CHF , CVA 07/2022 Denies:  PVD   HISTORY:  Past Medical History:  Past Medical History:  Diagnosis Date   Anxiety    CAD S/P percutaneous coronary angioplasty    a. s/p BMS-mLAD 2010. b. DES to RCA 2012. c. 04/2015: DES to LCx and PCI to LAD c/b  dissection with subsequent overlapping DES to mLAD - r/i for NSTEMI afterwards; d. 08/2015 Ant STEMI in setting of noncompliance->123mLAD ISR (3.0x16 Synergy DES); e. 03/2016 PCI of apical LAD; f. 03/2016 Relook Cath: patent LAD stents, RCA 100p CTO-->Med Rx; g. 10/2016 Ant STEMI: PTCA 100p LAD.   Chronic combined systolic and diastolic CHF (congestive heart failure) (HCC)    a. 03/2016 Echo: EF 30-35%, Gr2 DD;  b. 08/2016 Echo: EF 40%.   Depression    Diabetes mellitus without complication (HCC)    Metformin    Essential hypertension    GAD (generalized anxiety disorder)    GERD (gastroesophageal reflux disease)    Heart attack (HCC)    x 4   Hypercholesteremia    Ischemic cardiomyopathy    a. prior EF 25-35 percent at cath, 35-40 percent by echo; b. 03/2016 Echo: EF 30-35%, diff HK, Gr2 DD; c. 08/2016 Echo: EF 40%, base/mid inferior/inferowseptal AK, mildly dil LA.   Morbid obesity (HCC)    SVT (supraventricular tachycardia) APRROX 10 YRS AGO   PRESUMED AVNRT, ECHO 12/07 WITH EF 65%, MILD LAE   Tobacco abuse    a. 20 + pack years, quit 10/2016.   Past Surgical History:  Past Surgical History:  Procedure Laterality Date   CARDIAC CATHETERIZATION N/A 04/23/2015   Procedure: Left Heart Cath and Coronary Angiography;  Surgeon: Maude JAYSON Emmer, MD;  Location:  [facility] INVASIVE CV LAB;  Service: Cardiovascular;  Laterality: N/A;   CARDIAC CATHETERIZATION N/A 04/23/2015   Procedure: Coronary Stent Intervention;  Surgeon: Ozell Fell, MD;  Location: [facility] INVASIVE CV LAB;  Service: Cardiovascular;  Laterality: N/A;   CARDIAC CATHETERIZATION N/A 09/14/2015   Procedure: Left Heart Cath and Coronary Angiography;  Surgeon: Victory LELON Sharps, MD;  LAD 100%, CFX 10% ISR, RCA 100% (chronic), EF 25-35%   CARDIAC CATHETERIZATION N/A 09/14/2015   Procedure: Coronary Stent Intervention;  Surgeon: Victory LELON Sharps, MD;  Location: [facility] INVASIVE CV LAB;  Service: Cardiovascular;  Laterality: N/A; 3.0 x 16 mm Synergy DES LAD   CARDIAC  CATHETERIZATION  03/16/2016   CARDIAC CATHETERIZATION N/A 03/16/2016   Procedure: Left Heart Cath and Coronary Angiography;  Surgeon: Victory LELON Sharps, MD;  Location: [facility] INVASIVE CV LAB;  Service: Cardiovascular;  Laterality: N/A;   CARDIAC CATHETERIZATION N/A 03/16/2016   Procedure: Coronary Balloon Angioplasty;  Surgeon: Victory LELON Sharps, MD;  Location: [facility] INVASIVE CV LAB;  Service: Cardiovascular;  Laterality: N/A;   CARDIAC CATHETERIZATION N/A 03/25/2016   Procedure: Left Heart Cath and Coronary Angiography;  Surgeon: Peter M Jordan, MD;  Location: [facility] INVASIVE CV LAB;  Service: Cardiovascular;  Laterality: N/A;   CORONARY ANGIOPLASTY WITH STENT PLACEMENT  2010; 2013   CORONARY BALLOON ANGIOPLASTY N/A 10/19/2016   Procedure: Coronary Balloon Angioplasty;  Surgeon: Candyce GORMAN Reek, MD;  Location: [facility] INVASIVE CV LAB;  Service: Cardiovascular;  Laterality: N/A;   CORONARY ULTRASOUND/IVUS N/A 10/19/2016   Procedure: Intravascular Ultrasound/IVUS;  Surgeon: Candyce GORMAN Reek, MD;  Location: [facility] INVASIVE CV LAB;  Service: Cardiovascular;  Laterality: N/A;   LEFT HEART CATH AND CORONARY ANGIOGRAPHY N/A 10/19/2016   Procedure: Left Heart Cath and Coronary Angiography;  Surgeon: Candyce GORMAN Reek, MD;  Location: [facility] INVASIVE CV LAB;  Service: Cardiovascular;  Laterality: N/A;   RIGHT/LEFT HEART CATH AND CORONARY ANGIOGRAPHY N/A 05/21/2020   Procedure: RIGHT/LEFT HEART CATH AND CORONARY ANGIOGRAPHY;  Surgeon: Rolan Ezra GORMAN, MD;  Location: [facility] INVASIVE CV LAB;  Service: Cardiovascular;  Laterality: N/A;   Social History:  reports that he has been smoking cigarettes. He started smoking about 31 years ago. He has a 24 pack-year smoking history. He has never used smokeless tobacco. He reports that he does not currently use drugs after having used the following drugs: Marijuana. He reports that he does not drink alcohol. Family History:  Family History  Problem Relation Age of Onset   Arrhythmia Mother        MOTHER  LIVED TO BE 48   Coronary artery disease Mother    Heart attack Mother    Hypertension Mother    Coronary artery disease Father 60   Heart disease Father    Heart attack Father    Heart attack Brother    Stroke Neg Hx      HOME MEDICATIONS: Allergies as of 07/17/2024   No Known Allergies      Medication List        Accurate as of July 17, 2024  8:53 AM. If you have any questions, ask your nurse or doctor.          acetaminophen  325 MG tablet Commonly known as: TYLENOL  Take 2 tablets (650 mg total) by mouth every 4 (four) hours as needed for headache or mild pain.   albuterol  108 (90 Base) MCG/ACT inhaler Commonly known as: VENTOLIN  HFA Inhale 2 puffs into the lungs every 4 (four) hours as needed for wheezing or  shortness of breath.   aspirin  EC 81 MG tablet Take 1 tablet (81 mg total) by mouth daily.   atorvastatin  80 MG tablet Commonly known as: LIPITOR  Take 1 tablet (80 mg total) by mouth daily.   blood glucose meter kit and supplies Dispense based on patient and insurance preference. Use up to four times daily as directed. (FOR ICD-10 E10.9, E11.9).   buPROPion  150 MG 24 hr tablet Commonly known as: WELLBUTRIN  XL TAKE 1 TABLET BY MOUTH EVERY DAY   carvedilol  25 MG tablet Commonly known as: COREG  TAKE 1 TABLET (25 MG TOTAL) 2 (TWO) TIMES DAILY WITH A MEAL. PLEASE CALL TO SCHEDULE FOLLOW UP   clopidogrel  75 MG tablet Commonly known as: PLAVIX  Take 1 tablet (75 mg total) by mouth daily.   dapagliflozin  propanediol 10 MG Tabs tablet Commonly known as: Farxiga  Take 1 tablet (10 mg total) by mouth daily before breakfast.   digoxin  0.125 MG tablet Commonly known as: LANOXIN  Take 1 tablet (125 mcg total) by mouth daily.   Entresto  97-103 MG Generic drug: sacubitril -valsartan  Take 1 tablet by mouth 2 (two) times daily.   fenofibrate  160 MG tablet Take 1 tablet (160 mg total) by mouth daily.   furosemide  40 MG tablet Commonly known as: LASIX  Take  1 tablet (40 mg total) by mouth daily.   icosapent  Ethyl 1 g capsule Commonly known as: Vascepa  Take 2 capsules (2 g total) by mouth 2 (two) times daily.   LORazepam  1 MG tablet Commonly known as: ATIVAN  Take 1 tablet (1 mg total) by mouth every 8 (eight) hours as needed for anxiety.   metFORMIN  500 MG 24 hr tablet Commonly known as: GLUCOPHAGE -XR Take 1 tablet (500 mg total) by mouth daily with breakfast. What changed: when to take this Changed by: Donell PARAS Jeremia Groot   nitroGLYCERIN  0.4 MG SL tablet Commonly known as: NITROSTAT  Place 1 tablet (0.4 mg total) under the tongue every 5 (five) minutes as needed for chest pain.   potassium chloride  SA 20 MEQ tablet Commonly known as: KLOR-CON  M Take 2 tablets (40 mEq total) by mouth daily.   predniSONE  20 MG tablet Commonly known as: DELTASONE  Take 3 tablets (60 mg total) by mouth daily.   Repatha  SureClick 140 MG/ML Soaj Generic drug: Evolocumab  INJECT 1 PEN INTO THE SKIN EVERY 14 (FOURTEEN) DAYS.   Semaglutide  (1 MG/DOSE) 4 MG/3ML Sopn Inject 1 mg as directed once a week.   spironolactone  25 MG tablet Commonly known as: ALDACTONE  Take 1 tablet (25 mg total) by mouth daily.         OBJECTIVE:   Vital Signs: BP (!) 142/82   Ht 5' 7 (1.702 m)   Wt 232 lb (105.2 kg)   BMI 36.34 kg/m   Wt Readings from Last 3 Encounters:  07/17/24 232 lb (105.2 kg)  04/20/24 230 lb (104.3 kg)  03/05/24 229 lb (103.9 kg)     Exam: General: Pt appears well and is in NAD  Lungs: Clear with good BS bilat   Heart: RRR   Extremities: No pretibial edema.   Neuro: MS is good with appropriate affect, pt is alert and Ox3   DM FOOT Exam :01/16/2024  The skin of the feet is without sores or ulcerations. The pedal pulses are 1+ on right and 1+ on left. The sensation is intact to a screening 5.07, 10 gram monofilament bilaterally    DATA REVIEWED:  Lab Results  Component Value Date   HGBA1C 5.8 (A) 07/17/2024  HGBA1C 5.5  01/16/2024   HGBA1C 5.6 05/15/2023    Latest Reference Range & Units 04/20/24 11:05  Sodium 135 - 145 mmol/L 141  Potassium 3.5 - 5.1 mmol/L 3.9  Chloride 98 - 111 mmol/L 104  CO2 22 - 32 mmol/L 29  Glucose 70 - 99 mg/dL 886 (H)  BUN 6 - 20 mg/dL 12  Creatinine 9.38 - 8.75 mg/dL 8.92  Calcium  8.9 - 10.3 mg/dL 9.0  Anion gap 5 - 15  8  GFR, Estimated >60 mL/min >60    ASSESSMENT / PLAN / RECOMMENDATIONS:   1) Type 2 Diabetes Mellitus, Optimally controlled, With microalbuminuria - Most recent A1c of 5.8 %. Goal A1c < 7.0 %.    -A1c remains optimal - Intolerant to higher doses of Metformin  > 1000 mg -Given that his A1c is low at 5.8% I have suggested decreasing metformin  to 1 tablet daily as below - We decreased Ozempic  dose, due to severe weight loss that the patient was dissatisfied with     MEDICATIONS: Decrease metformin  500 mg XR, 1 tab daily  Continue Farxiga  10 mg daily  Continue  Ozempic  1 mg weekly    EDUCATION / INSTRUCTIONS: BG monitoring instructions: Patient is instructed to check his blood sugars 1 times a day, fasting. Call [facility] if: BG persistently < 70  I reviewed the Rule of 15 for the treatment of hypoglycemia in detail with the patient. Literature supplied.    2) Diabetic complications:  Eye: Does not have known diabetic retinopathy.  Neuro/ Feet: Does not have known diabetic peripheral neuropathy .  Renal: Patient does not have known baseline CKD. He   is on an ACEI/ARB at present.   3) Dyslipidemia:   - Per cardiology    4) Microalbuminuria:  - MA/CR ratio has been elevated , pending today -The patient is already on valsartan  -He follows with nephrology    F/U in 6 months   Signed electronically by: Stefano Redgie Butts, MD  [facility] Endocrinology  [facility] [address] [place] [place], [place] [address] Phone: [phone] FAX: [phone]   CC: Wendee Lynwood HERO, NP [address] [place] [address] Phone: [phone]  Fax: [phone]  Return to Endocrinology clinic as below: Future Appointments  Date Time Provider Department Center  07/18/2024  3:40 PM Wendee Lynwood HERO, NP [facility] [address]  04/09/2025  3:40 PM [facility] ANNUAL WELLNESS VISIT 1 [facility] [address]

## 2024-07-17 NOTE — Patient Instructions (Signed)
-   Decrease Metformin  500 mg XR , 1 tablet daily  - Continue Farxiga  10 mg, 1 tablet with Breakfast  - Continue Ozempic  1 mg weekly     HOW TO TREAT LOW BLOOD SUGARS (Blood sugar LESS THAN 70 MG/DL) Please follow the RULE OF 15 for the treatment of hypoglycemia treatment (when your (blood sugars are less than 70 mg/dL)   STEP 1: Take 15 grams of carbohydrates when your blood sugar is low, which includes:  3-4 GLUCOSE TABS  OR 3-4 OZ OF JUICE OR REGULAR SODA OR ONE TUBE OF GLUCOSE GEL    STEP 2: RECHECK blood sugar in 15 MINUTES STEP 3: If your blood sugar is still low at the 15 minute recheck --> then, go back to STEP 1 and treat AGAIN with another 15 grams of carbohydrates.

## 2024-07-18 ENCOUNTER — Encounter: Payer: Self-pay | Admitting: Nurse Practitioner

## 2024-07-18 ENCOUNTER — Ambulatory Visit: Admitting: Nurse Practitioner

## 2024-07-18 VITALS — BP 132/82 | HR 91 | Temp 98.0°F | Ht 66.0 in | Wt 231.4 lb

## 2024-07-18 DIAGNOSIS — I2510 Atherosclerotic heart disease of native coronary artery without angina pectoris: Secondary | ICD-10-CM

## 2024-07-18 DIAGNOSIS — Z0000 Encounter for general adult medical examination without abnormal findings: Secondary | ICD-10-CM

## 2024-07-18 DIAGNOSIS — Z122 Encounter for screening for malignant neoplasm of respiratory organs: Secondary | ICD-10-CM

## 2024-07-18 DIAGNOSIS — E1129 Type 2 diabetes mellitus with other diabetic kidney complication: Secondary | ICD-10-CM

## 2024-07-18 DIAGNOSIS — Z126 Encounter for screening for malignant neoplasm of bladder: Secondary | ICD-10-CM

## 2024-07-18 DIAGNOSIS — I214 Non-ST elevation (NSTEMI) myocardial infarction: Secondary | ICD-10-CM

## 2024-07-18 DIAGNOSIS — Z1211 Encounter for screening for malignant neoplasm of colon: Secondary | ICD-10-CM

## 2024-07-18 DIAGNOSIS — Z125 Encounter for screening for malignant neoplasm of prostate: Secondary | ICD-10-CM

## 2024-07-18 DIAGNOSIS — I509 Heart failure, unspecified: Secondary | ICD-10-CM

## 2024-07-18 DIAGNOSIS — I10 Essential (primary) hypertension: Secondary | ICD-10-CM

## 2024-07-18 DIAGNOSIS — F411 Generalized anxiety disorder: Secondary | ICD-10-CM

## 2024-07-18 LAB — MICROALBUMIN / CREATININE URINE RATIO
Creatinine, Urine: 128 mg/dL (ref 20–320)
Microalb Creat Ratio: 1352 mg/g{creat} — ABNORMAL HIGH (ref <30)
Microalb, Ur: 173 mg/dL

## 2024-07-18 NOTE — Progress Notes (Signed)
 Established Patient Office Visit  Subjective   Patient ID: Peter Russo, male    DOB: [birth year]  Age: 48 y.o. MRN: [id number]  Chief Complaint  Patient presents with   Annual Exam    HPI  NSTEMI/CAD/CHF/HTN: Patient currently maintained on aspirin  81 mg daily, atorvastatin  80 mg daily, carvedilol  25 mg twice daily, Plavix  75 mg daily, Farxiga  10 mg daily, digoxin  0.125 mg daily, Repatha  140 mg every 14 days, fenofibrate  160 daily, furosemide  40 mg daily, Vascepa  2 g twice daily, spironolactone  25 mg daily and Entresto  97-1031 tablet twice daily.  Followed by cardiology  CVA: Currently maintained on atorvastatin  80 mg daily  DM2: Patient currently maintained on semaglutide  1 mg weekly, metformin  500 mg daily, Farxiga  10 mg daily and is followed by endocrinology.  HLD: On atorvastatin  80 mg daily, Repatha  140 mg every 14 days, fenofibrate  160 mg daily, Vascepa  2 g twice daily.  Followed by cardiology  GAD: Patient currently maintained on bupropion  150 mg daily.  Mood is up-and-down.  Patient does carry a lot of anxiety.  States that his father passed away in his 18s and since he turned 24 has been having more of a difficult time.  Has not done therapy in the past but is interested.   for complete physical and follow up of chronic conditions.  Immunizations: -Tetanus: Completed in within 10 years -Influenza: refused  -Shingles: Discussed in office get local pharmacy -Pneumonia: discussed in office   Diet: Fair diet.  States that he is not eating much due to the ozempic . He lost a lot of weight. She decreased to 1mg . He will do a meal and snack a lot through the day  Exercise: No regular exercise.  Eye exam: needs updating. Reading glasses  Dental exam: Completes semi-annually    Colonoscopy: Referral to [facility]: Due order placed today  PSA: Due  Sleep:e will sleep 2-3 hours and then get up and then he will grab a sanck and then go back to  sleep. He will sleep 2-3 hours and then will get up and get water and go back to sleep for the rest of the night.      Review of Systems  Constitutional:  Negative for chills and fever.  Respiratory:  Negative for shortness of breath.   Cardiovascular:  Negative for chest pain and leg swelling.  Gastrointestinal:  Negative for abdominal pain, blood in stool, constipation, diarrhea, nausea and vomiting.  Genitourinary:  Negative for dysuria and hematuria.  Neurological:  Negative for tingling and headaches.  Psychiatric/Behavioral:  Negative for hallucinations and suicidal ideas.       Objective:     BP 132/82   Pulse 91   Temp 98 F (36.7 C) (Oral)   Ht 5' 6 (1.676 m)   Wt 231 lb 6.4 oz (105 kg)   SpO2 96%   BMI 37.35 kg/m  BP Readings from Last 3 Encounters:  07/18/24 132/82  07/17/24 (!) 142/82  04/20/24 135/86   Wt Readings from Last 3 Encounters:  07/18/24 231 lb 6.4 oz (105 kg)  07/17/24 232 lb (105.2 kg)  04/20/24 230 lb (104.3 kg)   SpO2 Readings from Last 3 Encounters:  07/18/24 96%  04/20/24 99%  03/28/24 100%      Physical Exam Vitals and nursing note reviewed.  Constitutional:      Appearance: Normal appearance.  HENT:     Right Ear: Tympanic membrane, ear canal and external ear normal.  Left Ear: Tympanic membrane, ear canal and external ear normal.     Mouth/Throat:     Mouth: Mucous membranes are moist.     Pharynx: Oropharynx is clear.  Eyes:     Extraocular Movements: Extraocular movements intact.     Pupils: Pupils are equal, round, and reactive to light.  Cardiovascular:     Rate and Rhythm: Normal rate and regular rhythm.     Pulses: Normal pulses.     Heart sounds: Normal heart sounds.  Pulmonary:     Effort: Pulmonary effort is normal.     Breath sounds: Normal breath sounds.  Abdominal:     General: Bowel sounds are normal. There is no distension.     Palpations: There is no mass.     Tenderness: There is no abdominal  tenderness.     Hernia: No hernia is present.  Genitourinary:    Comments: Deferred Musculoskeletal:     Right lower leg: No edema.     Left lower leg: No edema.  Lymphadenopathy:     Cervical: No cervical adenopathy.  Skin:    General: Skin is warm.  Neurological:     General: No focal deficit present.     Mental Status: He is alert.     Deep Tendon Reflexes:     Reflex Scores:      Bicep reflexes are 2+ on the right side and 2+ on the left side.      Patellar reflexes are 2+ on the right side and 2+ on the left side.    Comments: Bilateral upper and lower extremity strength 5/5  Psychiatric:        Mood and Affect: Mood normal.        Behavior: Behavior normal.        Thought Content: Thought content normal.        Judgment: Judgment normal.      No results found for any visits on 07/18/24.    The ASCVD Risk score (Arnett DK, et al., 2019) failed to calculate for the following reasons:   Risk score cannot be calculated because patient has a medical history suggesting prior/existing ASCVD    Assessment & Plan:   Problem List Items Addressed This Visit       Cardiovascular and Mediastinum   NSTEMI (non-ST elevated myocardial infarction) (HCC)   History of the same patient is followed by cardiology and currently on high intensity statin.      Relevant Orders   PSA, Medicare   Lipid panel   Essential hypertension   Currently maintained on carvedilol  25 mg twice daily, furosemide  40 mg, spironolactone  25 mg.  Blood pressure elevated upon initial check but within normal limits upon recheck.  Continue taking medication as prescribed follow-up with cardiologist as recommended      Relevant Orders   CBC with Differential/Platelet   Comprehensive metabolic panel with GFR   TSH   Lipid panel   CAD (coronary artery disease)   Maintained on atorvastatin  80 mg daily, Plavix  75 mg daily, Repatha  140 mg subcu every 14 days, fenofibrate  160 daily, Vascepa  2 g twice daily.   Pending lipid panel patient is followed by cardiology      Relevant Orders   PSA, Medicare   Lipid panel   CHF (congestive heart failure) (HCC)   Currently maintained on Entresto  97-103 twice daily, carvedilol  25 mg twice daily, Farxiga  10 mg daily, furosemide  40 mg daily, spironolactone  25 mg daily.  Patient was slightly winded  with exertion in office will check BNP.  Patient followed by cardiology      Relevant Orders   Brain natriuretic peptide     Endocrine   Type 2 diabetes mellitus with microalbuminuria, with long-term current use of insulin  [facility])   Patient currently followed by endocrinology.  Most recent A1c well-controlled.  Patient maintained on metformin  500 mg daily, Farxiga  10 mg daily, Ozempic  1 mg weekly.  Continue medication as prescribed follow-up specialist as recommended        Other   GAD (generalized anxiety disorder)   Currently maintained on bupropion  150 mg daily.  Patient is having breakthrough anxiety.  We will start with therapy did discuss possibly doing buspirone 5 mg twice daily patient would like to hold off due to high pill burden already      Relevant Orders   TSH   Ambulatory referral to Psychology   Preventative health care - Primary   Discussed age-appropriate immunizations and screening exams.  Did review patient's personal, surgical, social, family histories.  Patient is up-to-date on all age-appropriate vaccinations he would like.  Patient refused flu vaccine today.  Did discuss shingles and pneumonia vaccine in office today.  Ambulatory order placed for GI referral for CRC screening.  PSA ordered today for prostate cancer screening.  Patient was given information at discharge about preventative healthcare maintenance with anticipatory guidance.      Relevant Orders   CBC with Differential/Platelet   Comprehensive metabolic panel with GFR   TSH   Other Visit Diagnoses       Screening for lung cancer       Relevant Orders   Ambulatory  Referral Lung Cancer Screening [facility]     Screening for prostate cancer       Relevant Orders   PSA, Medicare     Screening for colon cancer       Relevant Orders   Ambulatory referral to Gastroenterology     Screening for bladder cancer       Relevant Orders   Urine Microscopic       Return in about 1 year (around 07/18/2025) for CPE and Labs.    Adina Crandall, NP

## 2024-07-18 NOTE — Assessment & Plan Note (Signed)
 Discussed age-appropriate immunizations and screening exams.  Did review patient's personal, surgical, social, family histories.  Patient is up-to-date on all age-appropriate vaccinations he would like.  Patient refused flu vaccine today.  Did discuss shingles and pneumonia vaccine in office today.  Ambulatory order placed for GI referral for CRC screening.  PSA ordered today for prostate cancer screening.  Patient was given information at discharge about preventative healthcare maintenance with anticipatory guidance.

## 2024-07-18 NOTE — Assessment & Plan Note (Signed)
 Maintained on atorvastatin  80 mg daily, Plavix  75 mg daily, Repatha  140 mg subcu every 14 days, fenofibrate  160 daily, Vascepa  2 g twice daily.  Pending lipid panel patient is followed by cardiology

## 2024-07-18 NOTE — Assessment & Plan Note (Signed)
 Currently maintained on Entresto  97-103 twice daily, carvedilol  25 mg twice daily, Farxiga  10 mg daily, furosemide  40 mg daily, spironolactone  25 mg daily.  Patient was slightly winded with exertion in office will check BNP.  Patient followed by cardiology

## 2024-07-18 NOTE — Assessment & Plan Note (Signed)
 History of the same patient is followed by cardiology and currently on high intensity statin.

## 2024-07-18 NOTE — Patient Instructions (Signed)
 Nice to see you today  I will be in touch with the labs once I have them  Follow up with me in 1 year, sooner If you need me  Let me know if you want to try the anxiety medication   Consider getting the shingles vaccine (shingrix) at the local pharmacy  Consider getting the pneumonia vaccine (prevnar 20)

## 2024-07-18 NOTE — Assessment & Plan Note (Signed)
 Currently maintained on carvedilol  25 mg twice daily, furosemide  40 mg, spironolactone  25 mg.  Blood pressure elevated upon initial check but within normal limits upon recheck.  Continue taking medication as prescribed follow-up with cardiologist as recommended

## 2024-07-18 NOTE — Assessment & Plan Note (Signed)
 Patient currently followed by endocrinology.  Most recent A1c well-controlled.  Patient maintained on metformin  500 mg daily, Farxiga  10 mg daily, Ozempic  1 mg weekly.  Continue medication as prescribed follow-up specialist as recommended

## 2024-07-18 NOTE — Assessment & Plan Note (Signed)
 Currently maintained on bupropion  150 mg daily.  Patient is having breakthrough anxiety.  We will start with therapy did discuss possibly doing buspirone 5 mg twice daily patient would like to hold off due to high pill burden already

## 2024-07-19 ENCOUNTER — Ambulatory Visit: Payer: Self-pay | Admitting: Internal Medicine

## 2024-07-19 LAB — URINALYSIS, MICROSCOPIC ONLY

## 2024-07-19 LAB — CBC WITH DIFFERENTIAL/PLATELET
Basophils Absolute: 0.2 K/uL — ABNORMAL HIGH (ref 0.0–0.1)
Basophils Relative: 2 % (ref 0.0–3.0)
Eosinophils Absolute: 0.3 K/uL (ref 0.0–0.7)
Eosinophils Relative: 3 % (ref 0.0–5.0)
HCT: 43.4 % (ref 39.0–52.0)
Hemoglobin: 14.8 g/dL (ref 13.0–17.0)
Lymphocytes Relative: 20.9 % (ref 12.0–46.0)
Lymphs Abs: 1.8 K/uL (ref 0.7–4.0)
MCHC: 34.1 g/dL (ref 30.0–36.0)
MCV: 90.8 fl (ref 78.0–100.0)
Monocytes Absolute: 0.8 K/uL (ref 0.1–1.0)
Monocytes Relative: 9.2 % (ref 3.0–12.0)
Neutro Abs: 5.5 K/uL (ref 1.4–7.7)
Neutrophils Relative %: 64.9 % (ref 43.0–77.0)
Platelets: 180 K/uL (ref 150.0–400.0)
RBC: 4.78 Mil/uL (ref 4.22–5.81)
RDW: 14.9 % (ref 11.5–15.5)
WBC: 8.5 K/uL (ref 4.0–10.5)

## 2024-07-19 LAB — LIPID PANEL
Cholesterol: 180 mg/dL (ref 0–200)
HDL: 37.2 mg/dL — ABNORMAL LOW (ref >39.00)
LDL Cholesterol: 123 mg/dL — ABNORMAL HIGH (ref 0–99)
NonHDL: 142.65
Total CHOL/HDL Ratio: 5
Triglycerides: 98 mg/dL (ref 0.0–149.0)
VLDL: 19.6 mg/dL (ref 0.0–40.0)

## 2024-07-19 LAB — COMPREHENSIVE METABOLIC PANEL WITH GFR
ALT: 22 U/L (ref 0–53)
AST: 19 U/L (ref 0–37)
Albumin: 4.2 g/dL (ref 3.5–5.2)
Alkaline Phosphatase: 69 U/L (ref 39–117)
BUN: 14 mg/dL (ref 6–23)
CO2: 29 meq/L (ref 19–32)
Calcium: 9.1 mg/dL (ref 8.4–10.5)
Chloride: 102 meq/L (ref 96–112)
Creatinine, Ser: 1.05 mg/dL (ref 0.40–1.50)
GFR: 82.86 mL/min (ref >60.00)
Glucose, Bld: 86 mg/dL (ref 70–99)
Potassium: 4 meq/L (ref 3.5–5.1)
Sodium: 140 meq/L (ref 135–145)
Total Bilirubin: 0.9 mg/dL (ref 0.2–1.2)
Total Protein: 6.9 g/dL (ref 6.0–8.3)

## 2024-07-19 LAB — BRAIN NATRIURETIC PEPTIDE: Pro B Natriuretic peptide (BNP): 232 pg/mL — ABNORMAL HIGH (ref 0.0–100.0)

## 2024-07-19 LAB — PSA, MEDICARE: PSA: 0.8 ng/mL (ref 0.10–4.00)

## 2024-07-19 LAB — TSH: TSH: 2.19 u[IU]/mL (ref 0.35–5.50)

## 2024-07-22 ENCOUNTER — Ambulatory Visit: Payer: Self-pay | Admitting: Nurse Practitioner

## 2024-07-26 ENCOUNTER — Other Ambulatory Visit: Payer: Self-pay

## 2024-07-26 ENCOUNTER — Telehealth: Payer: Self-pay

## 2024-07-26 DIAGNOSIS — Z1211 Encounter for screening for malignant neoplasm of colon: Secondary | ICD-10-CM

## 2024-07-26 MED ORDER — NA SULFATE-K SULFATE-MG SULF 17.5-3.13-1.6 GM/177ML PO SOLN: 354.0000 mL | Freq: Once | ORAL | 0 refills | Status: AC

## 2024-07-26 NOTE — Telephone Encounter (Signed)
 Cardiac learance and Plavix  hold was faxed to patient's cardiologist. Awaiting for response to notify patient.

## 2024-07-26 NOTE — Addendum Note (Signed)
 Addended by: MICAEL PORTO A on: 07/26/2024 12:20 PM   Modules accepted: Orders

## 2024-07-26 NOTE — Telephone Encounter (Signed)
 Gastroenterology Pre-Procedure Review  Request Date: 10/29/2024 Requesting Physician: Dr. Jinny  PATIENT REVIEW QUESTIONS: The patient responded to the following health history questions as indicated:    1. Are you having any GI issues? no 2. Do you have a personal history of Polyps? no 3. Do you have a family history of Colon Cancer or Polyps? no 4. Diabetes Mellitus? yes (Metformin  and semaglutide ) 5. Joint replacements in the past 12 months?no 6. Major health problems in the past 3 months?no 7. Any artificial heart valves, MVP, or defibrillator?no    MEDICATIONS & ALLERGIES:    Patient reports the following regarding taking any anticoagulation/antiplatelet therapy:   Plavix , Coumadin, Eliquis, Xarelto, Lovenox , Pradaxa, Brilinta , or Effient? yes (Plavix  hold 7 days and Metformin  hold 2 days) Aspirin ? yes (ASA 81 MG)  Patient confirms/reports the following medications:  Current Outpatient Medications  Medication Sig Dispense Refill   acetaminophen  (TYLENOL ) 325 MG tablet Take 2 tablets (650 mg total) by mouth every 4 (four) hours as needed for headache or mild pain.     albuterol  (VENTOLIN  HFA) 108 (90 Base) MCG/ACT inhaler Inhale 2 puffs into the lungs every 4 (four) hours as needed for wheezing or shortness of breath. 1 each 0   aspirin  81 MG EC tablet Take 1 tablet (81 mg total) by mouth daily. 90 tablet 3   atorvastatin  (LIPITOR ) 80 MG tablet Take 1 tablet (80 mg total) by mouth daily. 90 tablet 0   blood glucose meter kit and supplies Dispense based on patient and insurance preference. Use up to four times daily as directed. (FOR ICD-10 E10.9, E11.9). 1 each 0   buPROPion  (WELLBUTRIN  XL) 150 MG 24 hr tablet TAKE 1 TABLET BY MOUTH EVERY DAY 90 tablet 3   carvedilol  (COREG ) 25 MG tablet TAKE 1 TABLET (25 MG TOTAL) 2 (TWO) TIMES DAILY WITH A MEAL. PLEASE CALL TO SCHEDULE FOLLOW UP 180 tablet 0   clopidogrel  (PLAVIX ) 75 MG tablet Take 1 tablet (75 mg total) by mouth daily. 90 tablet 1    dapagliflozin  propanediol (FARXIGA ) 10 MG TABS tablet Take 1 tablet (10 mg total) by mouth daily before breakfast. 30 tablet 5   digoxin  (LANOXIN ) 0.125 MG tablet Take 1 tablet (125 mcg total) by mouth daily. 30 tablet 0   Evolocumab  (REPATHA  SURECLICK) 140 MG/ML SOAJ INJECT 1 PEN INTO THE SKIN EVERY 14 (FOURTEEN) DAYS. 6 mL 3   fenofibrate  160 MG tablet Take 1 tablet (160 mg total) by mouth daily. 90 tablet 3   furosemide  (LASIX ) 40 MG tablet Take 1 tablet (40 mg total) by mouth daily. 30 tablet 6   icosapent  Ethyl (VASCEPA ) 1 g capsule Take 2 capsules (2 g total) by mouth 2 (two) times daily. 120 capsule 11   LORazepam  (ATIVAN ) 1 MG tablet Take 1 tablet (1 mg total) by mouth every 8 (eight) hours as needed for anxiety. 15 tablet 0   metFORMIN  (GLUCOPHAGE -XR) 500 MG 24 hr tablet Take 1 tablet (500 mg total) by mouth daily with breakfast. 90 tablet 3   nitroGLYCERIN  (NITROSTAT ) 0.4 MG SL tablet Place 1 tablet (0.4 mg total) under the tongue every 5 (five) minutes as needed for chest pain. 25 tablet 3   potassium chloride  SA (KLOR-CON  M) 20 MEQ tablet Take 2 tablets (40 mEq total) by mouth daily. 180 tablet 3   predniSONE  (DELTASONE ) 20 MG tablet Take 3 tablets (60 mg total) by mouth daily. 15 tablet 0   sacubitril -valsartan  (ENTRESTO ) 97-103 MG Take 1 tablet by mouth  2 (two) times daily. 60 tablet 11   Semaglutide , 1 MG/DOSE, 4 MG/3ML SOPN Inject 1 mg as directed once a week. 9 mL 3   spironolactone  (ALDACTONE ) 25 MG tablet Take 1 tablet (25 mg total) by mouth daily. 90 tablet 3   No current facility-administered medications for this visit.    Patient confirms/reports the following allergies:  Allergies[1]  No orders of the defined types were placed in this encounter.   AUTHORIZATION INFORMATION Primary Insurance: 1D#: Group #:  Secondary Insurance: 1D#: Group #:  SCHEDULE INFORMATION: Date: 10/29/2024 Time: Location: [facility] Dr. Jinny     [1] No Known Allergies

## 2024-07-30 NOTE — Progress Notes (Signed)
 DEMAREA LOREY                                          MRN: [id number]   07/30/2024   The VBCI Quality Team Specialist reviewed this patient medical record for the purposes of chart review for care gap closure. The following were reviewed: abstraction for care gap closure-glycemic status assessment and kidney health evaluation for diabetes:eGFR  and uACR.    VBCI Quality Team

## 2024-08-01 ENCOUNTER — Telehealth (HOSPITAL_COMMUNITY): Payer: Self-pay

## 2024-08-01 NOTE — Telephone Encounter (Signed)
°  ADVANCED HEART FAILURE CLINIC   Pre-operative Risk Assessment   HEARTCARE STAFF-IMPORTANT INSTRUCTIONS 1 Red and Blue Text will auto delete once note is signed or closed. 2 Press F2 to navigate through template.   3 On drop down lists, L click to select >> R click to activate next field 4 Reason for Visit format is IMPORTANT!!  See Directions on No. 2 below. 5 Please review chart to determine if there is already a clearance note open for this procedure!!  DO NOT duplicate if a note already exists!!    :1}      Request for Surgical Clearance    Procedure:  Colonoscopy  Date of Surgery:  Clearance 10/29/24                                 Surgeon:  Dr. Jinny Surgeon's Group or Practice Name:  [facility] Phone number:  [phone] Fax number:  [phone]   Type of Clearance Requested:   - Medical    Type of Anesthesia:  General    Additional requests/questions:  Please fax a copy of Clearance to the surgeon's office.  Signed, Lisa CHRISTELLA Sergeant   08/01/2024, 11:27 AM   Advanced [facility] Failure Clinic Jordan Lee, NP [facility] [address] [facility] [place] [address] [phone] (office) [phone] (fax)

## 2024-08-01 NOTE — Telephone Encounter (Signed)
 Clearance form faxed vis Epic

## 2024-08-02 ENCOUNTER — Telehealth: Payer: Self-pay | Admitting: Acute Care

## 2024-08-02 ENCOUNTER — Other Ambulatory Visit: Payer: Self-pay

## 2024-08-02 DIAGNOSIS — Z122 Encounter for screening for malignant neoplasm of respiratory organs: Secondary | ICD-10-CM

## 2024-08-02 DIAGNOSIS — Z87891 Personal history of nicotine dependence: Secondary | ICD-10-CM

## 2024-08-02 DIAGNOSIS — F17210 Nicotine dependence, cigarettes, uncomplicated: Secondary | ICD-10-CM

## 2024-08-02 NOTE — Telephone Encounter (Signed)
 Lung Cancer Screening Narrative/Criteria Questionnaire (Cigarette Smokers Only- No Cigars/Pipes/vapes)   TYLEK BONEY   SDMV:08/13/24@10am [place]                                           1976/06/19               LDCT: [facility] 09/09/24@093a    48 y.o.   Phone: [phone]  Lung Screening Narrative (confirm age 40-77 yrs Medicare / 50-80 yrs Private pay insurance)   Insurance information:[employer]   Referring Provider:Cable   This screening involves an initial phone call with a team member from our program. It is called a shared decision making visit. The initial meeting is required by insurance and Medicare to make sure you understand the program. This appointment takes about 15-20 minutes to complete. The CT scan will completed at a separate date/time. This scan takes about 5-10 minutes to complete and you may eat and drink before and after the scan.  Criteria questions for Lung Cancer Screening:   Are you a current or former smoker? Current Age began smoking: 14y   If you are a former smoker, what year did you quit smoking? NA   To calculate your smoking history, I need an accurate estimate of how many packs of cigarettes you smoked per day and for how many years. (Not just the number of PPD you are now smoking)   Years smoking 34 x Packs per day 1 to 2 = Pack years 51   (at least 20 pack yrs)   (Make sure they understand that we need to know how much they have smoked in the past, not just the number of PPD they are smoking now)  Do you have a personal history of cancer?  No    Do you have a family history of cancer? No  Are you coughing up blood?  No  Have you had unexplained weight loss of 15 lbs or more in the last 6 months? No  It looks like you meet all criteria.     Additional information: N/A

## 2024-08-12 NOTE — Telephone Encounter (Signed)
 Good morning Peter Russo. I wanted to reach out to you since I have not received your cardiac letter. Can you please let me know where I could find it? Thank you.

## 2024-08-13 ENCOUNTER — Ambulatory Visit: Admitting: Acute Care

## 2024-08-13 ENCOUNTER — Telehealth: Payer: Self-pay

## 2024-08-13 ENCOUNTER — Encounter

## 2024-08-13 DIAGNOSIS — F17210 Nicotine dependence, cigarettes, uncomplicated: Secondary | ICD-10-CM | POA: Diagnosis not present

## 2024-08-13 DIAGNOSIS — Z122 Encounter for screening for malignant neoplasm of respiratory organs: Secondary | ICD-10-CM

## 2024-08-13 NOTE — Patient Instructions (Signed)
 Thank you for participating in the [place] Lung Cancer Screening Program. It was our pleasure to meet you today. We will call you with the results of your scan within the next few days. Your scan will be assigned a Lung RADS category score by the physicians reading the scans.  This Lung RADS score determines follow up scanning.  See below for description of categories, and follow up screening recommendations. We will be in touch to schedule your follow up screening annually or based on recommendations of our providers. We will fax a copy of your scan results to your Primary Care Physician, or the physician who referred you to the program, to ensure they have the results. Please call the office if you have any questions or concerns regarding your scanning experience or results.  Our office number is [phone]. Please speak with Karna Curly, RN., Karna Doom RN, or [facility] RN, and Isaiah Dover RN. They are  our Lung Cancer Screening RN.'s If They are unavailable when you call, Please leave a message on the voice mail. We will return your call at our earliest convenience.This voice mail is monitored several times a day.  Remember, if your scan is normal, we will scan you annually as long as you continue to meet the criteria for the program. (Age 50-80, Current smoker or smoker who has quit within the last 15 years). If you are a smoker, remember, quitting is the single most powerful action that you can take to decrease your risk of lung cancer and other pulmonary, breathing related problems. We know quitting is hard, and we are here to help.  Please let us  know if there is anything we can do to help you meet your goal of quitting. If you are a former smoker, counselling psychologist. We are proud of you! Remain smoke free! Remember you can refer friends or family members through the number above.  We will screen them to make sure they meet criteria for the program. Thank you for helping us   take better care of you by participating in Lung Screening.  For Virtual Smoking Cessation Classes , The American Lung Association Provides  Freedom From Smoking Classes.  Please search their website for dates and times.    Lung RADS Categories:  Lung RADS 1: no nodules or definitely non-concerning nodules.  Recommendation is for a repeat annual scan in 12 months.  Lung RADS 2:  nodules that are non-concerning in appearance and behavior with a very low likelihood of becoming an active cancer. Recommendation is for a repeat annual scan in 12 months.  Lung RADS 3: nodules that are probably non-concerning , includes nodules with a low likelihood of becoming an active cancer.  Recommendation is for a 1-month repeat screening scan. Often noted after an upper respiratory illness. We will be in touch to make sure you have no questions, and to schedule your 61-month scan.  Lung RADS 4 A: nodules with concerning findings, recommendation is most often for a follow up scan in 3 months or additional testing based on our provider's assessment of the scan. We will be in touch to make sure you have no questions and to schedule the recommended 3 month follow up scan.  Lung RADS 4 B:  indicates findings that are concerning. We will be in touch with you to schedule additional diagnostic testing based on our provider's  assessment of the scan.  Smoking Cessation   We specifically discussed trying reduce to quit strategies and behavioral techniques (the  quitSTART app, calling 1-800-QUITNOW). Due to your heart failure diagnosis, nicotine patches or medications like Chantix should likely be avoided. Talk to your PCP about their thoughts on the nicotine patches/medications if that is something you are interested in.   Tips to Quit:  Pick a Quit Day within the next week.  Remove temptations: toss cigarettes, lighters, ashtrays.  Tell someone you trust for support.  Avoid triggers like stress, boredom, or being  around smokers.  Use healthy replacements: water, gum, walking, deep breaths.  Stay busy with hobbies, music, drawing, or exercise.  Be patient with yourself--slipping once doesnt mean failure.   You can receive free nicotine replacement therapy (patches, gum, or mints) by calling 1-800-QUIT NOW. Please call so we can get you on the path to becoming a non-smoker. I know it is hard, but you can do this!  The American Lung Association offers Freedom From Smoking Programs Self guided or group programs offered, check their website for free virtual programs available to [facility] residents Lung Helpline at 1-800-LUNGUSA  http://keith.biz/  Hypnosis for smoking cessation  [employer] 613-791-5408  Acupuncture for smoking cessation  [employer] 650-619-6093   [employer] Offers tools and tips to quit smoking.  Free quitSTART app:   Monitor progress, manage cravings, access tools, and more with the app.  portablegrid.se   Freedom From Smoking  Virtual Group, FREE to [place] residents  (Class sizes are capped at 16 people) January 7th-February18th Wednesdays 6:15 pm- 7:45 pm  poodlehair.com.ee Contact:   Kimetha Fulwood   [email or web address]  [phone]   Your CT scan is scheduled for 09/09/2024 at 9:20 am at [facility].

## 2024-08-13 NOTE — Telephone Encounter (Signed)
 Attempted to call patient X 3 for Shared Decision Making Visit for lung cancer screening. Unable to leave voicemail as voicemail box not set up yet. CT scan is scheduled for 09/09/2024, will attempt to reschedule SDMV and potentially CT scan.

## 2024-08-13 NOTE — Progress Notes (Signed)
 Virtual Visit via Telephone Note  I connected with Peter Russo on 08/13/2024 at 11:00 AM EST by telephone and verified that I am speaking with the correct person using two identifiers.  Location: Patient: At home, in [place]  Provider: [address] [place], [place], [address]    I discussed the limitations, risks, security and privacy concerns of performing an evaluation and management service by telephone and the availability of in person appointments. I also discussed with the patient that there may be a patient responsible charge related to this service. The patient expressed understanding and agreed to proceed.  Shared Decision Making Visit Lung Cancer Screening Program [phone])   Eligibility: Age 48 y.o. Pack Years Smoking History Calculation 51 pack years  (# packs/per year x # years smoked) Recent History of coughing up blood  no Unexplained weight loss? no ( >Than 15 pounds within the last 6 months ) Prior History Lung / other cancer no (Diagnosis within the last 5 years already requiring surveillance chest CT Scans). Smoking Status Current Smoker Former Smokers: Years since quit: N/A  Quit Date: N/A  Visit Components: Discussion included one or more decision making aids. yes Discussion included risk/benefits of screening. yes Discussion included potential follow up diagnostic testing for abnormal scans. yes Discussion included meaning and risk of over diagnosis. yes Discussion included meaning and risk of False Positives. yes Discussion included meaning of total radiation exposure. yes  Counseling Included: Importance of adherence to annual lung cancer LDCT screening. yes Impact of comorbidities on ability to participate in the program. yes Ability and willingness to under diagnostic treatment. yes  Smoking Cessation Counseling: Current Smokers:  Discussed importance of smoking cessation. yes Information about tobacco cessation classes and interventions  provided to patient. yes Patient provided with ticket for LDCT Scan. N/A Symptomatic Patient. yes  Counseling(Intermediate counseling: > three minutes) [address] Diagnosis Code: Tobacco Use Z72.0 Asymptomatic Patient no  Counseling N/A Former Smokers:  Discussed the importance of maintaining cigarette abstinence. N/A Diagnosis Code: Personal History of Nicotine Dependence. S12.108 Information about tobacco cessation classes and interventions provided to patient. Yes Patient provided with ticket for LDCT Scan. N/A Written Order for Lung Cancer Screening with LDCT placed in Epic. Yes (CT Chest Lung Cancer Screening Low Dose W/O CM) [id number] Z12.2-Screening of respiratory organs Z87.891-Personal history of nicotine dependence  Peter Russo is a current user of tobacco or nicotine products. He is considering quitting at this time. We specifically discussed attempting reduce to quit strategies and behavioral techniques to employ during triggers. Counseling provided today addressed the risks of continued use and the benefits of cessation. Discussed tobacco/nicotine use history, readiness to quit, and evidence-based treatment options including behavioral strategies, support resources, and pharmacologic therapies. Provided encouragement and educational materials on steps and resources to quit smoking. Patient questions were addressed, and follow-up recommended for continued support. Total time spent on counseling: 7 minutes.   Shared decision visit completed by [name], FNP as a registered nurse awaiting credentialing.    [name], FNP

## 2024-08-19 ENCOUNTER — Telehealth (HOSPITAL_BASED_OUTPATIENT_CLINIC_OR_DEPARTMENT_OTHER): Payer: Self-pay

## 2024-08-19 ENCOUNTER — Telehealth: Payer: Self-pay

## 2024-08-19 NOTE — Telephone Encounter (Signed)
 Please make appt with heart failure as they are seeing him more closely and often. Thank you.  KL

## 2024-08-19 NOTE — Telephone Encounter (Signed)
"  ° °  Pre-operative Risk Assessment    Patient Name: Peter Russo  DOB: 12/22/1975 MRN: [id number]   Date of last office visit: 01/23/2024 with Greig Mosses, NP Date of next office visit: None  Request for Surgical Clearance    Procedure:  colonoscopy  Date of Surgery:  Clearance 10/29/24                                 Surgeon:  Dr. Rogelia Copping Surgeon's Group or Practice Name: [facility] Phone number:  [phone] Fax number:  [phone] ATTN: Claudette, CMA   Type of Clearance Requested:   - Medical  - Pharmacy:  Hold Aspirin  and Clopidogrel  (Plavix ) -needs instruction   Type of Anesthesia:  General    Additional requests/questions:  None  SignedPatrcia Iverson CROME   08/19/2024, 3:08 PM   "

## 2024-08-19 NOTE — Telephone Encounter (Signed)
 I will send a message to Heart Failure scheduling team to reach out to the pt with an appt for preop clearance, see notes from preop APP.

## 2024-08-19 NOTE — Telephone Encounter (Signed)
 Cardiac clearance and Plavix  hold form request was faxed to his cardiologist office. Awaiting for response.

## 2024-08-19 NOTE — Telephone Encounter (Signed)
 Will forward back to preop to confirm if pt is to see Heart Failure as this seems to be who is pt is following. Pt has not seen gen cards in over 3+ yrs. Pt saw Dr. Shlomo for OSA. Now will see Dr. Mona for HLD.   Is appt to be with Heart Failure or gen card? If Gen card pt will need a new pt appt back to gen card.

## 2024-08-19 NOTE — Telephone Encounter (Signed)
" ° °  Name: Peter Russo  DOB: 10-May-1976  MRN: [id number]  Primary Cardiologist: Maude Emmer, MD  Chart reviewed as part of pre-operative protocol coverage. Because of [name]'s past medical history and time since last visit, he will require a follow-up in-office visit in order to better assess preoperative cardiovascular risk. Multiple CV morbidities,followed by CHF, Gen Cards and now lipid clinic (to be seen). EF of < 20% per last echo.  Would need to have in person assessment for recommendations.  Pre-op covering staff: - Please schedule appointment and call patient to inform them. If patient already had an upcoming appointment within acceptable timeframe, please add pre-op clearance to the appointment notes so provider is aware. - Please contact requesting surgeon's office via preferred method (i.e, phone, fax) to inform them of need for appointment prior to surgery.  This message will also be routed to pharmacy pool and/or provider for input on holding Plavix  as requested below so that this information is available to the clearing provider at time of patient's appointment.   [name], NP  08/19/2024, 3:34 PM   "

## 2024-08-20 NOTE — Telephone Encounter (Signed)
 Spoke with [place] in scheduling with [facility]. She stated she would contact pt and get him scheduled for an echo that is needed and his preop clearance.

## 2024-09-03 NOTE — Telephone Encounter (Signed)
 Patient has an appointment with cardiology 09/25/2024.

## 2024-09-03 NOTE — Telephone Encounter (Signed)
 Please read message from 08/19/2024 to follow up.

## 2024-09-09 ENCOUNTER — Ambulatory Visit: Admission: RE | Admit: 2024-09-09 | Source: Ambulatory Visit

## 2024-09-13 ENCOUNTER — Ambulatory Visit
Admission: RE | Admit: 2024-09-13 | Discharge: 2024-09-13 | Disposition: A | Source: Ambulatory Visit | Attending: Acute Care | Admitting: Acute Care

## 2024-09-13 DIAGNOSIS — Z87891 Personal history of nicotine dependence: Secondary | ICD-10-CM | POA: Insufficient documentation

## 2024-09-13 DIAGNOSIS — F17210 Nicotine dependence, cigarettes, uncomplicated: Secondary | ICD-10-CM | POA: Insufficient documentation

## 2024-09-13 DIAGNOSIS — Z122 Encounter for screening for malignant neoplasm of respiratory organs: Secondary | ICD-10-CM | POA: Diagnosis present

## 2024-09-18 ENCOUNTER — Other Ambulatory Visit: Payer: Self-pay

## 2024-09-18 ENCOUNTER — Telehealth: Payer: Self-pay | Admitting: *Deleted

## 2024-09-18 DIAGNOSIS — F17210 Nicotine dependence, cigarettes, uncomplicated: Secondary | ICD-10-CM

## 2024-09-18 DIAGNOSIS — Z122 Encounter for screening for malignant neoplasm of respiratory organs: Secondary | ICD-10-CM

## 2024-09-18 DIAGNOSIS — Z87891 Personal history of nicotine dependence: Secondary | ICD-10-CM

## 2024-09-18 NOTE — Telephone Encounter (Signed)
 Spoke with patient and reviewed recent Lung CT results. He will complete an annual Lung CT again next year. Order placed. Pt will follow up with PCP and Cardiologist to discuss lymphadenopathy, PAH and atherosclerosis. Results and plan to PCP.

## 2024-09-18 NOTE — Telephone Encounter (Signed)
 Lauraine Lagos, NP has reviewed lung screening CT results and recommends a 12 month repeat lung screening CT.  PCP messaged regarding lymphadenopathy- possibly reactive related to chronic heart issues.

## 2024-09-25 ENCOUNTER — Ambulatory Visit (HOSPITAL_COMMUNITY)

## 2024-09-25 ENCOUNTER — Ambulatory Visit (HOSPITAL_COMMUNITY): Admitting: Cardiology

## 2024-09-25 ENCOUNTER — Institutional Professional Consult (permissible substitution) (HOSPITAL_BASED_OUTPATIENT_CLINIC_OR_DEPARTMENT_OTHER): Admitting: Internal Medicine

## 2024-10-29 ENCOUNTER — Ambulatory Visit: Admit: 2024-10-29 | Admitting: Gastroenterology

## 2024-10-29 SURGERY — COLONOSCOPY
Anesthesia: General

## 2025-01-15 ENCOUNTER — Ambulatory Visit: Admitting: Internal Medicine

## 2025-04-09 ENCOUNTER — Ambulatory Visit
